# Patient Record
Sex: Male | Born: 1935 | Race: White | Hispanic: No | Marital: Married | Smoking: Former smoker
Health system: Southern US, Community
[De-identification: ages and names within clinical notes are randomized; demographics above are authoritative.]

## PROBLEM LIST (undated history)

## (undated) ENCOUNTER — Emergency Department (HOSPITAL_COMMUNITY): Payer: Medicare Other

## (undated) DIAGNOSIS — R011 Cardiac murmur, unspecified: Secondary | ICD-10-CM

## (undated) DIAGNOSIS — I341 Nonrheumatic mitral (valve) prolapse: Secondary | ICD-10-CM

## (undated) DIAGNOSIS — I10 Essential (primary) hypertension: Secondary | ICD-10-CM

## (undated) DIAGNOSIS — I480 Paroxysmal atrial fibrillation: Secondary | ICD-10-CM

## (undated) DIAGNOSIS — Z8719 Personal history of other diseases of the digestive system: Secondary | ICD-10-CM

## (undated) DIAGNOSIS — K219 Gastro-esophageal reflux disease without esophagitis: Secondary | ICD-10-CM

## (undated) DIAGNOSIS — J948 Other specified pleural conditions: Secondary | ICD-10-CM

## (undated) DIAGNOSIS — N182 Chronic kidney disease, stage 2 (mild): Secondary | ICD-10-CM

## (undated) DIAGNOSIS — N200 Calculus of kidney: Secondary | ICD-10-CM

## (undated) DIAGNOSIS — R1310 Dysphagia, unspecified: Secondary | ICD-10-CM

## (undated) DIAGNOSIS — I639 Cerebral infarction, unspecified: Secondary | ICD-10-CM

## (undated) DIAGNOSIS — M1990 Unspecified osteoarthritis, unspecified site: Secondary | ICD-10-CM

## (undated) DIAGNOSIS — E785 Hyperlipidemia, unspecified: Secondary | ICD-10-CM

## (undated) DIAGNOSIS — Z87442 Personal history of urinary calculi: Secondary | ICD-10-CM

## (undated) DIAGNOSIS — I4891 Unspecified atrial fibrillation: Secondary | ICD-10-CM

## (undated) DIAGNOSIS — Z8673 Personal history of transient ischemic attack (TIA), and cerebral infarction without residual deficits: Secondary | ICD-10-CM

## (undated) DIAGNOSIS — I2510 Atherosclerotic heart disease of native coronary artery without angina pectoris: Secondary | ICD-10-CM

## (undated) HISTORY — DX: Dysphagia, unspecified: R13.10

## (undated) HISTORY — DX: Personal history of transient ischemic attack (TIA), and cerebral infarction without residual deficits: Z86.73

## (undated) HISTORY — DX: Calculus of kidney: N20.0

## (undated) HISTORY — DX: Cerebral infarction, unspecified: I63.9

## (undated) HISTORY — DX: Hyperlipidemia, unspecified: E78.5

## (undated) HISTORY — DX: Nonrheumatic mitral (valve) prolapse: I34.1

## (undated) HISTORY — DX: Essential (primary) hypertension: I10

## (undated) HISTORY — DX: Other specified pleural conditions: J94.8

## (undated) HISTORY — DX: Atherosclerotic heart disease of native coronary artery without angina pectoris: I25.10

## (undated) SURGERY — ECHOCARDIOGRAM, TRANSESOPHAGEAL
Anesthesia: Moderate Sedation

---
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
## 1959-02-13 HISTORY — PX: APPENDECTOMY: SHX54

## 1997-10-01 ENCOUNTER — Ambulatory Visit (HOSPITAL_COMMUNITY): Admission: RE | Admit: 1997-10-01 | Discharge: 1997-10-01 | Payer: Self-pay | Admitting: *Deleted

## 2004-03-17 ENCOUNTER — Inpatient Hospital Stay (HOSPITAL_COMMUNITY): Admission: AD | Admit: 2004-03-17 | Discharge: 2004-03-20 | Payer: Self-pay | Admitting: *Deleted

## 2004-03-18 ENCOUNTER — Encounter (INDEPENDENT_AMBULATORY_CARE_PROVIDER_SITE_OTHER): Payer: Self-pay | Admitting: *Deleted

## 2004-03-19 ENCOUNTER — Encounter (INDEPENDENT_AMBULATORY_CARE_PROVIDER_SITE_OTHER): Payer: Self-pay | Admitting: *Deleted

## 2004-03-19 HISTORY — PX: CARDIAC CATHETERIZATION: SHX172

## 2004-04-07 ENCOUNTER — Inpatient Hospital Stay (HOSPITAL_COMMUNITY)
Admission: RE | Admit: 2004-04-07 | Discharge: 2004-04-13 | Payer: Self-pay | Admitting: Thoracic Surgery (Cardiothoracic Vascular Surgery)

## 2004-04-07 ENCOUNTER — Encounter (INDEPENDENT_AMBULATORY_CARE_PROVIDER_SITE_OTHER): Payer: Self-pay | Admitting: *Deleted

## 2004-04-14 HISTORY — PX: MITRAL VALVE REPAIR: SHX2039

## 2004-04-14 HISTORY — PX: CORONARY ARTERY BYPASS GRAFT: SHX141

## 2004-04-14 HISTORY — PX: MAZE: SHX5063

## 2004-05-11 ENCOUNTER — Encounter (HOSPITAL_COMMUNITY): Admission: RE | Admit: 2004-05-11 | Discharge: 2004-08-09 | Payer: Self-pay | Admitting: *Deleted

## 2004-06-01 ENCOUNTER — Encounter
Admission: RE | Admit: 2004-06-01 | Discharge: 2004-06-01 | Payer: Self-pay | Admitting: Thoracic Surgery (Cardiothoracic Vascular Surgery)

## 2004-08-17 ENCOUNTER — Encounter
Admission: RE | Admit: 2004-08-17 | Discharge: 2004-08-17 | Payer: Self-pay | Admitting: Thoracic Surgery (Cardiothoracic Vascular Surgery)

## 2005-03-22 ENCOUNTER — Encounter
Admission: RE | Admit: 2005-03-22 | Discharge: 2005-03-22 | Payer: Self-pay | Admitting: Thoracic Surgery (Cardiothoracic Vascular Surgery)

## 2005-12-29 ENCOUNTER — Inpatient Hospital Stay (HOSPITAL_COMMUNITY): Admission: EM | Admit: 2005-12-29 | Discharge: 2005-12-31 | Payer: Self-pay | Admitting: Emergency Medicine

## 2005-12-30 ENCOUNTER — Encounter (INDEPENDENT_AMBULATORY_CARE_PROVIDER_SITE_OTHER): Payer: Self-pay | Admitting: *Deleted

## 2007-04-17 ENCOUNTER — Ambulatory Visit: Payer: Self-pay | Admitting: Thoracic Surgery (Cardiothoracic Vascular Surgery)

## 2009-10-30 ENCOUNTER — Encounter: Admission: RE | Admit: 2009-10-30 | Discharge: 2009-10-30 | Payer: Self-pay | Admitting: Neurology

## 2009-11-14 ENCOUNTER — Ambulatory Visit (HOSPITAL_COMMUNITY): Admission: RE | Admit: 2009-11-14 | Discharge: 2009-11-14 | Payer: Self-pay | Admitting: Cardiology

## 2010-04-07 ENCOUNTER — Ambulatory Visit (HOSPITAL_COMMUNITY): Admission: RE | Admit: 2010-04-07 | Discharge: 2010-04-07 | Payer: Self-pay | Admitting: Gastroenterology

## 2010-09-21 ENCOUNTER — Other Ambulatory Visit: Payer: Self-pay | Admitting: *Deleted

## 2010-09-21 DIAGNOSIS — I10 Essential (primary) hypertension: Secondary | ICD-10-CM

## 2010-09-21 MED ORDER — BENAZEPRIL HCL 40 MG PO TABS: 40.0000 mg | ORAL_TABLET | Freq: Every day | ORAL | Status: DC

## 2010-09-21 NOTE — Telephone Encounter (Signed)
escribe medication per fax request  

## 2010-10-08 ENCOUNTER — Telehealth: Payer: Self-pay | Admitting: Cardiology

## 2010-10-08 NOTE — Telephone Encounter (Signed)
Called wanting to know if needs antibiotic for dental cleaning. Did not take last time he had teeth cleaned. Per Dr.Jordan does not need to have antibiotics because had mitral valve repair. Arthur [name] thought that was the case. He will let dentist know. If they have any questions can call back.

## 2010-10-08 NOTE — Telephone Encounter (Signed)
GOING TO HAVE DENTAL CLEANING ON Monday AND THEY SUGGUESTED THAT HE CALL TO SEE IF HE NEEDS TO TAKE ANTIBOTICS BEFORE.

## 2010-10-14 ENCOUNTER — Other Ambulatory Visit: Payer: Self-pay | Admitting: *Deleted

## 2010-10-14 MED ORDER — SIMVASTATIN 80 MG PO TABS: 40.0000 mg | ORAL_TABLET | Freq: Every day | ORAL | Status: DC

## 2010-10-14 NOTE — Telephone Encounter (Signed)
Pt called noted reduced dose of simvastatin to 40mg . Pt don't need refill currently, pt informed he will need a one year visit.Alfonso Ramus RN

## 2010-10-27 NOTE — Assessment & Plan Note (Signed)
OFFICE VISIT   [name], [name]  DOB:  January 15, 1929                                        April 17, 2007  CHART #:  [id number]   HISTORY OF PRESENT ILLNESS:  The patient returns for routine annual  surveillance and rhythm check now 3 years status post mitral valve  repair, coronary artery bypass grafting, and a MAZE procedure in October  2005.  He was last seen here in the office March 28, 2006.  Since  then, he has done well.  He has had no further transient ischemic  attacks as which he had suffered in July 2007.  He is getting along  quite well.  He has no exertional shortness of breath.  He has no  exertional chest discomfort.  He has no tachypalpitations or dizzy  spells.  He has no complaints.  The remainder of his past medical  history is unchanged.   CURRENT MEDICATIONS:  1. Benazepril 20 mg daily.  2. Aggrenox 20/200 one tablet daily.  3. Metoprolol 25 mg twice daily.  4. Simvastatin 80 mg daily.   PHYSICAL EXAM:  Notable for a well-appearing male with blood pressure  136/82.  Pulse 71.  Two-channel telemetry rhythm strips reveals normal  sinus rhythm.  Auscultation of the chest reveals clear breath sounds  which are symmetrical bilaterally.  No wheezes or rhonchi are noted.  Cardiovascular exam demonstrates regular rate and rhythm.  No murmurs,  rubs, or gallops are appreciated.  The abdomen is soft and nontender.  The extremities are warm and well perfused.  There is no lower extremity  edema.  The remainder of the physical exam is unrevealing.   IMPRESSION:  Satisfactory progress now 3 years following mitral valve  repair, coronary artery bypass grafting, and a MAZE procedure.  The  patient appears to be doing quite well and he is maintaining sinus  rhythm.  He does not have a murmur on exam and he has no signs or  symptoms of congestive heart failure.   PLAN:  The patient will continue to follow up with Dr. Reyes Ivan for long-  term  attention to his cardiac needs.  All of his questions have been  addressed.  In the future, he will call and return to see [place] here at  [facility] only should further problems or  difficulties arise.   Salvatore Decent. Cornelius Moras, M.D.  Electronically Signed   CHO/MEDQ  D:  04/17/2007  T:  04/18/2007  Job:  [id number]   cc:   [name]., M.D.  Evie Lacks, MD  Al Decant. Janey Greaser, MD

## 2010-10-30 NOTE — H&P (Signed)
NAME:  TULIO, FACUNDO NO.:  [id number]   MEDICAL RECORD NO.:  [id number]          PATIENT TYPE:  INP   LOCATION:  4705                         FACILITY:  [facility]   PHYSICIAN:  Genene Churn. Love, M.D.    DATE OF BIRTH:  [birth year]   DATE OF ADMISSION:  12/29/2005  DATE OF DISCHARGE:                                HISTORY & PHYSICAL   ADDENDUM   The patient was admitted initially going to be a __________ neurological  consult.  The general examination was performed the next day.  Heart  examination revealed no evidence of murmurs.  Tympanic membranes were clear.  Mouth was in good repair.  Lungs revealed no significant abnormalities.  There was no enlarged liver, spleen or kidneys.  There is no cyanosis,  clubbing or edema in the extremities.  This should be added to his admission  history and physical examination.           ______________________________  Genene Churn. Sandria Manly, M.D.     JML/MEDQ  D:  12/31/2005  T:  12/31/2005  Job:  [id number]

## 2010-10-30 NOTE — Discharge Summary (Signed)
NAME:  Arthur [name], Arthur [name] NO.:  [id number]   MEDICAL RECORD NO.:  [id number]          PATIENT TYPE:  INP   LOCATION:  2001                         FACILITY:  [facility]   PHYSICIAN:  Salvatore Decent. Cornelius Moras, M.D. DATE OF BIRTH:  1929-04-15   DATE OF ADMISSION:  04/07/2004  DATE OF DISCHARGE:                                 DISCHARGE SUMMARY   ANTICIPATED DATE OF DISCHARGE:  April 13, 2004.   ADMISSION DIAGNOSES:  1.  Severe mitral regurgitation.  2.  Severe three-vessel coronary artery disease.  3.  Persistent atrial fibrillation.   DISCHARGE/SECONDARY DIAGNOSES:  1.  Severe mitral regurgitation.  2.  Severe three-vessel coronary artery disease.  3.  Persistent atrial fibrillation.  4.  Elevated liver enzymes noted preoperatively.  Statin therapy currently      on hold.  On October 30th, SGOT showed a decreased from 70 to 49, SGPT      decreased from 73 to 43.  5.  Pulmonary hypertension.  6.  Hypertension.  7.  Hyperlipidemia.  8.  Preoperative right pleural effusion recently treated with needle      thoracentesis.  9.  Nephrolithiasis.  10. Renal insufficiency.  Creatinine on October 29th stable at 1.6.   ALLERGIES:  1.  PENICILLIN, which causes rash.  2.  SULFA-CONTAINING DRUGS, which causes rash.  3.  It is also thought that he develops a rash with LASIX.   PROCEDURES:  1.  On April 07, 2004, he underwent median sternotomy for mitral valve      repair (quadrangular resection of the posterior leaflet with sliding      leaflet annuloplasty and 26 mm Edwards Physio-Ring annuloplasty),      coronary artery bypass grafting x4 using a left internal mammary artery      to the distal left anterior descending coronary artery, saphenous vein      graft to the first diagonal branch, saphenous vein graft to the third      circumflex marginal branch, saphenous vein graft to the posterior      descending coronary artery, endoscopic saphenous vein harvesting from      the  right thigh, modified Cox-Mayes II procedure.  Surgeon, Dr. Tressie Stalker.  2.  On April 07, 2004, intraoperative transesophageal echocardiogram by      Dr. Kipp Brood.   BRIEF HISTORY:  Arthur [name] is a 77 year old Caucasian male, who was referred  to Dr. Cornelius Moras by Dr. Lady Deutscher for management of mitral regurgitation,  coronary artery disease, and persistent atrial fibrillation.  Arthur [name] has  had a long history of mitral valve prolapse as well as a history of  hypertension and hypercholesterolemia.  He has been followed for many years  by Dr. Doran Clay and over the last few years at [facility].  Approximately two to three months ago, he developed symptoms of tachy  palpitations.  During that same time frame, he also developed worsening  symptoms of exertional shortness of breath.  The symptoms worsened until  early October.  Ultimately he was admitted on October 4th  to [facility] with symptoms of increasing dyspnea on exertion, difficulty taking  a breath, and tachy palpitations.  He was noted to be in rapid atrial  fibrillation with a heart rate at 150 to 160 beats per minute.  He was  admitted to the hospital, and his atrial fibrillation was brought under  medical control.  He was also started on anticoagulation.  He subsequently  underwent a transesophageal echocardiogram, which demonstrated the presence  of severe mitral regurgitation, mitral valve prolapse, and a flailed  posterior leaflet at the mitral valve.  Left ventricular function was  notably preserved.  He subsequently underwent left and right heart  catheterization, which demonstrated the presence of severe three-vessel  coronary artery disease and confirmed the presence of severe mitral  regurgitation as well as moderate pulmonary hypertension.  Although Mr.  [name] has done well with medical therapy, it was felt he was now appropriate  for surgical intervention and thus was seen by Dr. Cornelius Moras.   After he examined  the patient and review of his tests, Dr. Cornelius Moras felt that Arthur [name] should  undergo coronary artery bypass grafting with mitral valve repair and  modified Cox-Mayes procedure.  After discussing risks, benefits,  alternatives with Arthur [name], he agreed to proceed and his surgery was  tentatively scheduled for October 25th.   HOSPITAL COURSE:  Arthur [name] was electively admitted to [facility]  on April 07, 2004, to undergo mitral valve repair, coronary artery bypass  grafting, and modified Cox-Mayes procedure.  He tolerated the procedure well  and was transferred to the surgical intensive care unit in stable condition.  Postoperative TEE showed no residual mitral regurgitation.  By later that  evening, he had been extubated and was neurologically intact.  Postoperative  labs were stable.   On postoperative day one, Arthur [name] remained hemodynamically stable.  He was  AAI paced, but with underlying sinus rhythm.  Exam showed no cardiac murmur.  Chest tube output was low, and these were discontinued.  Postoperative x-ray  was also stable.  He did show evidence of volume excess, and diuresis was  started with Edecrin as he was believed to have a LASIX ALLERGY.   On postoperative day two, Arthur [name] was felt stable for transfer to the  floor.  He was restarted on Coumadin.  That morning, he was maintaining  sinus rhythm, however, by that afternoon he was noted to be in atrial  fibrillation with a rate up to 150.  He was started on IV Amiodarone.  By  the following day, he had returned to normal sinus rhythm.  He was continued  on Metoprolol and Amiodarone.  His ACE inhibitor was held as his creatinine  had a slight elevation to 1.8.   Over the next few days, Arthur [name] remained stable.  Overall, he stayed in  sinus rhythm, but was noted to have a first degree heart block.  At times,  it also appeared that he had bursts of atrial fibrillation, but mostly was just noted  to have frequent premature atrial contractions.  His heart rate  did remain rate controlled in the 70s to 80s.  He remained hemodynamically  stable.  His oxygen saturations were above 90% on room air.  He showed  evidence of good diuresis, but remained with trace ankle edema.  His heart  showed no murmurs.  His lungs were slightly decreased in the bases, but  otherwise clear.  His abdominal exam was benign.  His incisions remained  clean and dry without signs of infection.  He was ambulating in the hallways  independently.  His renal function also remained stable, with his last  creatinine on October 29th being 1.6.  His followup LFTs showed improvement.  His bowel and bladder function was working appropriately.  He was tolerating  an oral diet, and his pain was controlled on oral medications.  Based on his  good progress, it was felt that he would be ready for discharge home on  April 12, 2004, pending no significant change in Arthur [name]' status over  the next 24 hours.   RADIOLOGY:  Chest x-ray on October 28th showed no pneumothorax with left  perihilar atelectasis, which was stable.  There was a small, left, pleural  effusion.  There was also right lower lung atelectasis and a small, right,  pleural effusion.   LABORATORY DATA:  Hepatic function panel on October 30th showed a total  bilirubin of 0.8, direct bilirubin 0.2, indirect bilirubin 0.6, alkaline  phosphatase 84, SGOT 49, SGPT 43, total protein 5.1, albumin 2.5.  At the  time of this dictation, his last PT-INR was 21.6 and 2.4.  Sodium 134,  potassium 4.0, blood glucose 101, BUN 29, creatinine 1.6.  White blood count  14.3, hemoglobin 11.6, hematocrit 34.0, platelet count 149.   DISCHARGE MEDICATIONS:  1.  Aspirin 81 mg one p.o. daily.  2.  Metoprolol 25 mg one p.o. b.i.d.  3.  Amiodarone 200 mg one p.o. b.i.d. x7 days, then decrease to 200 mg once      daily.  4.  Edecrin 50 mg one p.o. daily x5 days.  5.  K-Dur 20 mEq  one p.o. daily x5 days.  6.  Coumadin.  Final Coumadin dose to be determined prior to discharge based      on morning INR result.  7.  He was instructed to hold his Pravachol, Lotrel, and Diltiazem until      further instructed by Dr. Reyes Ivan.  8.  Ultram 50 mg 1-2 tablets p.o. q.4-6h p.r.n. pain.   DISCHARGE INSTRUCTIONS:  1.  He is avoid driving or heavy lifting more than 10 pounds.  2.  He was encouraged to continue daily walking and breathing exercises.  3.  He was to follow a low fat, low salt diet.  4.  He may shower daily and clean his incisions with mild soap and water.  5.  He should notify the CVTS office if he develops fever greater than 101,      redness or drainage from his incision site.   FOLLOWUP:  1.  He is to obtain PT-INR blood work on April 15, 2004, at Dr. Silva Bandy      office.  He is to call and schedule his appointment.  2.  He is to call to schedule a two-week followup with Dr. Reyes Ivan. 3.  He is to see Dr. Tressie Stalker at the CVTS office in three weeks. The      CVTS office will contact him regarding a specific appointment date and      time.  He is to bring his latest chest x-ray with him to his      appointment.       ________________________________________  Jerold Coombe, P.A.  ___________________________________________Clarence H. Cornelius Moras, M.D.    AWZ/MEDQ  D:  04/12/2004  T:  04/12/2004  Job:  [id number]   cc:   Salvatore Decent. Cornelius Moras, M.D.  [address]  [place]  [place] [address]   [name]., M.D.  [address]  [place], [place] [address]  Fax: [phone]   Al Decant. Janey Greaser, MD  [address]  [place]  [place] [address]  Fax: [phone]

## 2010-10-30 NOTE — Discharge Summary (Signed)
NAME:  TUCK, DULWORTH NO.:  [id number]   MEDICAL RECORD NO.:  [id number]          PATIENT TYPE:  INP   LOCATION:  3739                         FACILITY:  [facility]   PHYSICIAN:  [name]., M.D.DATE OF BIRTH:  09/02/1928   DATE OF ADMISSION:  03/17/2004  DATE OF DISCHARGE:  03/20/2004                                 DISCHARGE SUMMARY   DISCHARGE DIAGNOSES:  1.  Atrial fibrillation.  2.  Moderate to severe mitral regurgitation with significant prolapse of the      posterior mitral valve leaflet.  3.  Three-vessel coronary artery disease.  4.  Moderate pulmonary hypertension (secondary to his mitral regurgitation).  5.  Hypertension.  6.  Dyslipidemia.  7.  Transudative pleural effusion.   PROCEDURES DURING HOSPITALIZATION:  1.  Transesophageal echocardiogram.  2.  Thoracentesis.  3.  Right and left heart catheterization.   HISTORY OF PRESENT ILLNESS:  A 77 year old white male with Past Medical  History of hypertension, dyslipidemia presented to primary care physician's  office with complaints of palpitations and heart racing off and on for the  last 3 to 4 weeks.  Also noted increasing dyspnea on exertion and inability  to take a full breath.  The patient was sent to the hospital for direct  admission and further evaluation of his complaints.   HOSPITAL COURSE:  On arrival to the hospital, the patient was seen  immediately.  He was found to be in atrial fibrillation with a rapid  ventricular response with a heart rate in the range of 150 to 160.  He was  given a Cardizem 15 mg bolus followed by Cardizem drip at 10 mg per hour  with appropriate rate control.  He as anticoagulated with Lovenox.  He  reported significant improvement with rate control.  On hospital day #2, he  underwent transesophageal echocardiogram which showed a flail posterior  leaflet of the mitral valve at the middle cusp with moderate to severe  eccentric mitral regurgitation.  EF  was preserved at 55 to 60%.  Left atrial  size was moderately enlarged, right atrial size was mildly enlarged, left  ventricular chamber size was normal.  The patient was maintained on  appropriate rate-controlling medications.  Chest x-ray was obtained which  showed a moderate size right pleural effusion.  The patient then underwent  thoracentesis which showed a transudative effusion.  He reported significant  improvement in his breathing after his thoracentesis was completed.  Following his thoracentesis, he underwent right and left heart  catheterization showing obstructive three-vessel coronary disease with  severe mitral regurgitation, preserved LV systolic function, and moderate  pulmonary hypertension.  Please see full catheterization dictation for full  details.  Post catheterization he has done well.  He has now been tolerating  p.o. diet well.  His breathing has had significant improvement and he is now  ambulatory and back to normal.  He will be discharged home today on the  following medications:   DISCHARGE MEDICATIONS:  1.  Lotrel 5/20 mg once a day.  2.  Pravachol 80 mg once a day.  3.  Aspirin 81 mg once a day (the patient will take 325 mg until he is seen      in the office).  4.  Cardizem 120 mg b.i.d.  5.  Metoprolol 50 mg b.i.d.  6.  Coumadin 5 mg daily.  7.  Lasix 40 mg daily.   PAIN MANAGEMENT:  Ibuprofen or Tylenol on a p.r.n. basis.   ACTIVITY:  The patient was advised to do no strenuous activity or heavy  lifting for 48 hours.   DIETARY:  Should be low salt, low cholesterol diet.   WOUND CARE:  Routine post cath care.   The patient's fasting lipid panel was still pending on time of discharge.  This was sent on March 20, 2004.  An outpatient appointment will be made  for the patient to follow up with Dr. Tyrone Sage or Dr. Cornelius Moras for evaluation of  mitral valve repair/replacement, maze procedure, and coronary artery bypass  grafting.  Otherwise, the patient  will follow up in the office next Monday  or Tuesday for INR check as well as routine post cath office care.  I did  advise the patient to call should he have any further questions regarding  his ongoing care.       TWK/MEDQ  D:  03/20/2004  T:  03/20/2004  Job:  [id number]

## 2010-10-30 NOTE — Op Note (Signed)
NAME:  RIGDON, MACOMBER NO.:  [id number]   MEDICAL RECORD NO.:  [id number]          PATIENT TYPE:  INP   LOCATION:  2306                         FACILITY:  [facility]   PHYSICIAN:  Salvatore Decent. Cornelius Moras, M.D. DATE OF BIRTH:  22-May-1929   DATE OF PROCEDURE:  04/07/2004  DATE OF DISCHARGE:                                 OPERATIVE REPORT   PREOPERATIVE DIAGNOSIS:  1.  Severe mitral regurgitation.  2.  Severe three vessel coronary artery disease.  3.  Persistent atrial fibrillation.   POSTOPERATIVE DIAGNOSIS:  1.  Severe mitral regurgitation.  2.  Severe three vessel coronary artery disease.  3.  Persistent atrial fibrillation.   PROCEDURE:  Median sternotomy for mitral valve repair (quadrangular  resection of the posterior leaflet with sliding leaflet annuloplasty and #26  mm Edwards Physio ring annuloplasty), coronary artery bypass grafting x 4  (left internal mammary artery to distal left anterior descending  coronary  artery, saphenous vein graft to first diagonal branch, saphenous vein graft  to third circumflex marginal branch, saphenous vein graft to posterior  descending coronary artery, endoscopic saphenous vein harvest from right  thigh), modified Cox-Maze IV procedure.   SURGEON:  Salvatore Decent. Cornelius Moras, M.D.   ASSISTANT:  Sheliah Plane, MD   SECOND ASSISTANT:  Carmin Muskrat. Thelma Barge, P.A.-C.   ANESTHESIA:  General.   BRIEF CLINICAL NOTE:  The patient is a 77 year old gentleman with a known  history of mitral valve prolapse with mitral regurgitation, hypertension,  and hyperlipidemia.  He presents with tachy palpitations and progressive  symptoms of shortness of breath and is found to be in atrial fibrillation.  He was fully evaluated Dr. Lady Deutscher and underwent elective left nasd  right heart catheterization as well as transesophageal echocardiogram.  Findings of these studies demonstrated the presence of mitral valve prolapse  involving the posterior leaflet  of the mitral valve with severe (4+) mitral  regurgitation.  The patient also has severe three vessel coronary artery  disease.  Left ventricular function appears preserved.  A full consultation  note has been dictated previously.   OPERATIVE CONSENT:  The patient and his wife have been counseled at length  regarding the indications and potential benefits of elective surgical  intervention for definitive treatment of his mitral valve disease, coronary  artery disease, and his atrial fibrillation.  A variety of alternative  treatment strategies have been discussed in detail.  The patient understands  and accept all the associated risks of surgery including but not limited to  the risks of death, stroke, myocardial infarction, congestive heart failure,  respiratory failure, pneumonia, renal failure, bleeding requiring blood  transfusion, arrhythmia, heart block or bradycardia requiring permanent  pacemaker, recurrent coronary artery disease, recurrent congestive heart  failure, recurrent atrial fibrillation.  He understands the possibility of  need for valve replacement if successful valve repair is not feasible and if  this were the situation, the patient has elected to request a bioprosthetic  tissue valve be used in an effort to avoid the need for long term  anticoagulation with Coumadin.  All of his questions  have been addressed.   OPERATIVE FINDINGS:  1.  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  2.  Severe left atrial enlargement.  3.  Moderate to severe left ventricular dilatation and mild left ventricular      hypertrophy.  4.  Severe coronary artery disease with diffuse disease afflicting all of      the epicardial coronary arteries.  5.  Successful valve repair associated with no residual mitral regurgitation       after completion of the operation.   OPERATIVE NOTE IN DETAIL:  The patient was brought to the operating room on  the above mentioned date and central monitoring is established by the  anesthesia service under the care and direction of Dr. Kipp Brood.  Specifically, a Swan-Ganz catheter was placed through the right internal  jugular approach.  A radial arterial line was placed.  Intravenous  antibiotics are administered.  Following induction with general endotracheal  anesthesia, a Foley catheter was placed.  Baseline transesophageal  echocardiogram is performed by Dr. Noreene Larsson.  This confirms the presence of  severe mitral regurgitation with prolapse involving the middle scallop of  the posterior leaflet of the mitral valve.  There is a large jet of mitral  regurgitation which skirts along the anterior surface of the left atrium and  wraps around the atrium.  There is flow reversal in the pulmonary veins.  The left atrium is severely enlarged.  Left ventricular function appears  overall reasonably well preserved.  No other significant abnormalities are  identified.   The patient's chest, abdomen, both groins, and both lower extremities are  prepped and draped in a sterile manner.  A median sternotomy incision was  performed and the left internal mammary artery dissected from the chest wall  and prepared for bypass grafting.  The left internal mammary artery was a  good quality conduit.  Endoscopic saphenous vein harvest is performed and  the greater saphenous vein is removed from the entire right thigh and the  upper portion of the right lower leg.  This was performed through a small  incision made just below the right knee.  The saphenous vein which was  removed is notably excellent quality conduit.  After the saphenous vein is  removed, the small surgical incisions in the right lower extremity are  closed in multiple layers with running absorbable suture.  The patient was  heparinized systemically.  The pericardium was opened.  The  ascending aorta is mildly dilated.  It is free of any palpable plaques or  calcifications.  The ascending aorta is cannulated for cardiopulmonary  bypass.  A venous cannula is placed directly in the superior vena cava.  A  second venous cannula is placed low in the right atrium with the tip  extending down the inferior vena cava.  A retrograde cardioplegia catheter  is placed through the right atrium into the coronary sinus.  Adequate  heparinization is verified.  Cardiopulmonary bypass was begun.  Vessel loops  are placed around the superior vena cava and inferior vena cava.  The distal  sites are selected for coronary bypass grafting.  There is mild left  ventricular  hypertrophy.  There is moderate to severe left ventricular  dilation.  There is severe left atrial enlargement.  Portions of the  saphenous  vein and the left internal mammary artery are trimmed to the  appropriate length.  A temperature probe was placed in the left ventricular  septum.  A cardioplegia catheter was placed in the ascending aorta.  The  patient was cooled to 28 degrees systemic temperature.  The aortic Cross  clamp was applied and cardioplegia delivered initially in an antegrade  fashion through the aortic root.  Ice saline slush is applied for topical  hypothermia.  The initial cardioplegia arrest and myocardial cooling are  felt to be excellent.  Supplemental cardioplegia are administered retrograde  through the coronary sinus catheter.  Repeat doses of cardioplegia are  administered intermittently throughout the crossclamp portion of the  operation through the aortic root, down the subsequently placed vein grafts,  and retrograde through the coronary sinus catheter to maintain septal  temperature below 15 degrees Centigrade.   The heart is gently retracted towards the surgeons side to expose the  reflection of the left atrium and the left sided  pulmonary veins.  A Harpen  clamp is placed around behind the left sided pulmonary veins at their origin  from the left atrium.  An elliptical ablation lesion is created around the  base of the left sided pulmonary veins on the left atrial wall using the  Medtronic Cardioblate irrigated bipolar radiofrequency ablation device.  Following completion of this, another elliptical ablation lesion is created  with the bipolar ablation device across the base of the left atrial  appendage.  Subsequently, a short  linear lesion is created using a unipolar  irrigated radiofrequency ablation device connecting the ellipse that  surrounds the left atrial appendage with the ellipse surrounding the base of  the left sided pulmonary veins.  This was performed along the epicardial  surface of the left atrium.   Attention is now directed towards the distal coronary anastomoses and the  following distal coronary anastomoses are performed:  1.  The posterior descending artery is grafted with a saphenous vein graft      in an end-to-side fashion.  This vessel was diffusely diseased both      proximally and distally.  At the site of distal bypass, it measures 1.5      mm in diameter.  A 1.0 probe will pass distally.  The saphenous vein is      a good quality conduit.  The coronary is fair to poor quality target.  2.  The third circumflex marginal branch is grafted with a saphenous vein      graft in an end-to-side fashion.  This coronary is the largest branch      off the left circumflex coronary system.  It measures 1.4 mm in diameter      at the site of distal bypass.  It is a fair quality target and is      diffusely diseased proximally.  3.  The diagonal branch off the left anterior descending coronary artery is      grafted with a saphenous vein graft in an end-to-side fashion.  This      coronary measures 1.5 mm in diameter at the site of distal bypass.  It      is diffusely diseased proximally but is a  good target for bypass at the      site of distal grafting.  4.  The distal left anterior descending coronary artery is grafted with the      left internal mammary artery in an end-to-side fashion.  This coronary  measures 1.1 mm in diameter at the site of the distal bypass.  This      vessel is diffusely diseased proximally and small at the site of distal      bypass grafting.  It is a fair quality target, at best.   A left atriotomy incision is performed posteriorly through the intra-atrial  groove.  The floor of the left atrium is exposed using a self-retaining  retractor.  The following set of lesions are performed to complete the left  sided components of the modified Cox-Maze procedure:  1.  An elliptical lesion is created around the base of the right sided      pulmonary veins using the bipolar irrigated radiofrequency ablation      device.  The atriotomy incision serves as the anterior half of this      ellipse, whereas the posterior half is completed using one limb of the      bipolar device placed within the left atrium along the endocardial      surface and the other limb placed posterior to the left atrium along the      epicardial surface.  2.  A linear lesion is created across the dome of the left atrium placed      from the apex of the left atriotomy incision to reach straight across to      the apex of the ablation lesion surrounding the left sided pulmonary      vein.  This was performed with a bipolar device, again, using one limb      of the device along the endocardial surface and the other limb along the      epicardial surface.  3.  A similar linear lesion is created from the inferior rim of the left      atriotomy incision across the back wall of the left atrium to reach the      inferior rim of the elliptical lesion surrounding the left sided      pulmonary veins.  4.  A linear lesion is placed from the inferior rim of the atriotomy     incision and oriented  towards the mitral valve annulus in the direction      in the vicinity between the P2 and P3 portion of the posterior mitral      valve annulus.  This lesion was performed initially with the bipolar      lesion and this will reach about half the way to the mitral annulus.      The remainder of this lesion is completed all the way to the mitral      valve annulus using a unipolar handheld irrigation radiofrequency      ablation device from within the left atrium along the endocardial      surface.  This completes the components of the left sided Cox-Maze      procedure.   Attention is now directed to the mitral valve and the mitral valve is  exposed using a self-retaining retractor.  Exposure is felt to be excellent.  The valve was carefully analyzed and one can appreciate an obvious flail  segment of the middle scallop (P2) of the posterior leaflet of the mitral  valve.  There are several ruptured primary chords to this segment of the  posterior leaflet.  There is a large, well defined cleft between the P1 and  P2 portion of the posterior leaflet and a moderately developed cleft between  the P2 and P3 portion.  The flail segment of the posterior leaflet is  located somewhat eccentrically towards the P3 cleft.  There is no prolapse  involving the anterior leaflet and, in fact, the anterior leaflet is  relatively small in total dimension for a patient with myxomatous disease.  The total surface area of the anterior leaflet approximates the surface area  of a 26 mm annuloplasty ring.  Mitral valve repair is now performed by  performing a quadrangular resection of the portion of the middle scallop of  the posterior leaflet.  The entire flail segment is resected and this  extends from the cleft between the P2 and P3 portions of the posterior  leaflet across approximately 2/3 of the P2 segment.  Sliding leaflet  annuloplasty is then performed by initially mobilizing the remainder of the  P2  leaflet and portion of P1 as well as the P3 leaflet off the posterior  mitral annulus.  The posterior annulus is then shortened using several  interrupted figure-of-eight compression sutures with 2-0 Ethibond sutures.  The remaining segments of the posterior leaflet are then each reapproximated  to the posterior annulus using a two layered closure of running 4-0 Ethibond  suture.  The remaining vertical defect in the posterior leaflet is closed  using interrupted 5-0 Ethibond everting simple sutures.  The cleft between  the P1 and P2 portion of the posterior leaflet is also closed similarly  using interrupted 5-0 Ethibond everting simple sutures.  Ring annuloplasty  is performed using interrupted 2-0 Ethibond horizontal mattress sutures  placed circumferentially around the entire mitral valve annulus.  The mitral  annulus is sized to accept a 26 mm annuloplasty ring.  Subsequently, an  Edwards Physio ring is secured in place (model number 4450, serial number [id number]).  At the completion of the mitral valve repair, the valve is  inspected for competence.  The valve has a nice symmetrical line of  coaptation of the anterior and posterior leaflets and there appears to be no  residual leak, whatsoever.  Rewarming is begun.  The left atrial appendage  is oversewn from within the left atrium using a two layer closure of running  3-0 Prolene suture.   An oblique right atriotomy incision is performed extending from the acute  margin of the heart in an oblique direction oriented towards the right  inferior pulmonary vein.  Following this, the tip of the right atrial  appendage is amputated.  The Medtronic bipolar irrigated radiofrequency  ablation device is now utilized to create a series of bipolar ablation  lesions on the right atrial side:   1.  A linear lesion is placed along the posterior edge of the right atrium      extending from the posterior apex of the right atriotomy incision in a       cephalad direction onto the posterior surface of the superior vena cava.  2.  A similar linear lesion is placed in the opposite direction beginning at      the posterior apex of the right atriotomy incision and extending down      onto the posterior surface of the inferior vena cava.  3.  A linear lesion is created across the posterior half of the intra-atrial      septum beginning at the posterior apex of the right atriotomy incision      and extending to reach the inferior rim of the fossa ovalis.  This is      performed with one limb of the bipolar device  in the right atrium and      the other limb in the left atrium.  4.  A linear lesion is created from the tip of the right atrial appendage      extending in the direction perpendicular to the other right atriotomy      incision over a distance of 2 cm.  5.  One last bipolar lesion is created from the tip of the right atrial      appendage extending to reach the anterior acute margin of the heart.   The left atriotomy incision is closed using a standard two layer closure of  running 3-0 Prolene suture.  The proximal saphenous vein anastomoses for  both the circumflex marginal branch and the posterior descending coronary  artery branch were grafted directly to the ascending aorta prior to removal  of the aortic Cross clamp.  The patient is placed in Trendelenburg position.  The left internal mammary artery is reperfused and the left ventricular  septal temperature is noted to rise rapidly.  One final dose of warm  retrograde hot shot cardioplegia is administered.  The lungs are ventilated  and the heart allowed to fill to evaluate any residual air from the  pulmonary veins and left heart circulation.  The aortic Cross clamp was  removed after a total crossclamp time of 171 minutes.  The heart begins to  beat spontaneously without need for cardioversion.  The aortic root vent is  placed using a Magoon needle.  The remainder of the right  sided lesion set of the Cox-Maze procedure is now completed using the handheld unipolar  radiofrequency ablation device:  1.  A lesion is begun at the end of the previous lesion along the inferior      rim of the fossa ovalis and continued to reach the inferior rim of the      coronary sinus and on across the isthmus of the right atrium to reach      the posterior aspect of the tricuspid valve annulus.  Another lesion is      created from the tricuspid annulus across the isthmus of the heart to      reach the inferior vena cava along its anterior side.  2.  A short linear lesion is created from the anterior apex of the right      atriotomy incision across the acute margin of the heart to reach the      anterolateral portion of the tricuspid annulus.  3.  One final linear lesion is created from the anteromedial surface of the      tricuspid annulus to reach the previous linear lesion from the base of      the right atrial appendage.   The right atriotomy incisions are all closed using a standard two layer  closure of running 4-0 Prolene suture.  The proximal saphenous vein  anastomosis of the vein graft to the diagonal branch is now constructed in  an end-to-side fashion onto the hood of the saphenous vein graft to the  circumflex marginal branch.  The patient is rewarmed to 37 degrees  Centigrade temperature.  All proximal and distal anastomoses are inspected  for hemostasis and appropriate graft orientation.  Epicardial pacing wires  are affixed to the right ventricular outflow tract.  The IVC cannula is  removed and its cannulation site oversewn with Prolene suture.  Atrial  pacing wires are sewn to the surface of the right atrium immediately  inferior to the sinus  nodes.  Low dose Dopamine infusion is begun at 4 mcg  per kg per minute.  The patient is weaned from cardiopulmonary bypass  without difficulty.  The patient's rhythm at separation from bypass is  junctional rhythm.  Atrial  pacing is employed and the atrial capture is  straight forward.  Total cardiopulmonary bypass time for the operation is  219 minutes.  The patient is weaned from bypass on low dose Dopamine.   Follow up transesophageal echocardiogram performed by Dr. Noreene Larsson after  separation from bypass demonstrates a well seated mitral annuloplasty ring  with normal appearing mitral valve and no residual mitral regurgitation.  There is insignificant air.  Left ventricular  function appears preserved.  No other abnormalities are noted.  The aortic root vent is removed.  The SVC  cannula is removed and its cannulation site oversewn with Prolene suture.  The aortic cannula is removed.  Protamine is administered to reverse the  anticoagulation.  The mediastinum and the left chest are irrigated with  saline solution containing vancomycin.  Meticulous surgical hemostasis is  ascertained.  The patient is transfused 2 units of fresh frozen plasma and  one 10 pack of platelets due to mild coagulopathy.  The mediastinum and both left and right pleural spaces are drained using four chest tubes placed  through separate stab incisions inferiorly.  Of note, the patient has a  large partially loculated right pleural effusion which is drained.  The  median sternotomy  is closed in routine fashion and the soft tissues  anterior to the sternum are closed in multiple layers.  The skin was closed  is closed with running subcuticular skin closure.   The patient tolerated the procedure well and is transported to the surgical  intensive care unit in stable condition.  There are no interoperative  complications.  All sponge, instrument, and needle counts were verified as  correct at the completion of the operation.       CHO/MEDQ  D:  04/07/2004  T:  04/07/2004  Job:  [id number]   cc:   [name]., M.D.  [address]  [place], [place] [address]  Fax: [phone]   Al Decant. Janey Greaser, MD  [address]   [place]  [place] [address]  Fax: [phone]

## 2010-10-30 NOTE — Discharge Summary (Signed)
NAME:  Arthur [name], Arthur [name]                ACCOUNT NO.:  [id number]   MEDICAL RECORD NO.:  [id number]          PATIENT TYPE:  INP   LOCATION:  4705                         FACILITY:  [facility]   PHYSICIAN:  Gustavus Messing. Orlin Hilding, M.D.DATE OF BIRTH:  1929-06-12   DATE OF ADMISSION:  12/29/2005  DATE OF DISCHARGE:  12/31/2005                                 DISCHARGE SUMMARY   ADMITTING DIAGNOSIS:  Stroke versus transient ischemic attack, right brain.   DISCHARGE DIAGNOSIS:  Right brain transient ischemic attack.  MRI of the  brain was negative for stroke.   ADDITIONAL DIAGNOSES:  Include hypertension under poor control,  hyperlipidemia, 3-vessel coronary artery disease, remote history of atrial  fibrillation, mitral valve disease, status post annuloplasty, history of  chronic renal insufficiency, history of renal stones.   DIET:  Should be low fat, low cholesterol, heart prudent diet.   ACTIVITY:  No restrictions.   WOUND CARE:  Not applicable.   PAIN MANAGEMENT:  Tylenol 30 minutes before Aggrenox for 10 days.   MEDICATIONS:  Is a new medication, Aggrenox 1 twice a day.  He should not  take his aspirin.  Other than that, he is to resume his prior medications,  which included metoprolol 25 mg b.i.d., Vytorin 10 mg/40 mg p.o. daily,  benazepril 20 mg daily.   RETURN:  He should call Dr. Imagene Gurney office at [phone] to make an appointment  for a revisit in about 4 weeks.  He does not need any special referrals or  services.   STUDIES:  Include a 2D echocardiogram which was negative for any evidence of  a cardiac source of embolus.  Carotid Dopplers which showed no stenosis of  the carotids, vertebrals were antegrade.  Homocysteine level was normal at  13.5.  The MRI was not negative but did show multiple tiny right emboli.  MRA was negative intracranially.  Although right ICA disease was suspected  by Dr. Sandria Manly, it was not borne out by the carotid Doppler study.   HOSPITAL COURSE:  Please  refer to the admission notes for details of the  admission.  This is a 77 year old man who presented for transient left-sided  arm numbness which was gone by the time he was seen by Neurology.  He does  have risk factors for stroke, and so he was admitted to have carotid  Doppler, MRI of the brain, MRA, 2D echo, homocysteine level, and telemetry.  He did not have any recurrent atrial fibrillation while in the hospital.  The echo  and Dopplers were negative as already described.  Homocysteine was normal.  He is improved in normal condition with a normal neurologic exam with a  diagnosis of right brain TIA, and his medication was changed from aspirin to  Aggrenox.      Catherine A. Orlin Hilding, M.D.  Electronically Signed     CAW/MEDQ  D:  12/31/2005  T:  12/31/2005  Job:  [id number]

## 2010-10-30 NOTE — Cardiovascular Report (Signed)
NAME:  Arthur [name], Arthur [name] NO.:  [id number]   MEDICAL RECORD NO.:  [id number]          PATIENT TYPE:  INP   LOCATION:  3739                         FACILITY:  [facility]   PHYSICIAN:  [name]., M.D.DATE OF BIRTH:  06-03-1929   DATE OF PROCEDURE:  03/19/2004  DATE OF DISCHARGE:                              CARDIAC CATHETERIZATION   PROCEDURE:  Left-heart catheterization, right heart catheterization, LV  angiogram, aortic root angiogram with runoff.   INDICATIONS:  Moderate-to-severe mitral regurgitation.  Preoperative workup  prior to valve surgery.   PROCEDURE DESCRIPTION:  The patient was brought to the cardiac  catheterization laboratory after obtaining appropriate consent.  He was  prepped and draped in sterile fashion.  Approximately 20 cc of 1% lidocaine  was used for local anesthesia.  An 8-French sheath was placed in the right  femoral vein without difficulty, and a 6-French sheath was placed in the  right femoral artery without difficulty.  Right-heart catheterization was  then performed.  This was followed by left-heart catheterization, LV  angiogram, aortic root angiogram with runoff.  The patient tolerated the  procedure well.  No complications. He was transferred from the cardiac  catheterization laboratory in stable condition.   FINDINGS:  RIGHT HEART CATHETERIZATION:  RA: 18 mmHg.  RV: 50/16 mmHg.  PA:  53/24 mmHg.  Pulmonary capillary wedge pressure:  32 mmHg.  Cardiac output  4.6 with an index of 2.4 by thermodilution.  Cardiac output by Fick, 3.4  liters per minute with cardiac index of 1.7.  AO pressure 121/77, LVEDP  measured 24 mmHg.   LEFT HEART CATHETERIZATION:  LEFT MAIN:  A long vessel, moderate calcification.  No significant  obstructive disease.  LAD:  Proximal ectasia with tandem, mid 80% stenosis.  D1: A moderate-size  vessel with sequential 80% stenosis.  LCX:  Codominant vessel with ostial 40-  50% stenosis with 60-70% mid  stenosis prior to OM2 and OM3 branching.  OM1  has mild, luminal irregularities.  OM2, moderate size with mild luminal  irregularities.  OM3, moderate size with mild luminal irregularity.  RAMUS:  A small vessel with diffuse, moderate disease.  RCA:  Codominant with proximal ectasia, followed by proximal 50-60%  stenosis.  He has mid ectasia, followed by a 40-50% mid stenosis.  Also  distal ectasia.  PDA has sequential 70-80% stenosis at the mid vessel.  Right to left collaterals were seen filling the OM system.  LVEF was 55-60%  with 3-4+ mitral regurgitation.  Aortic root showed mild calcification with  mild enlargement, no AI.  The descending aorta, abdominal aorta, right iliac  was followed in runoff pattern showing no significant obstructive disease,  and diffuse ectatic areas in the right iliac system.   IMPRESSION:  1.  Obstructive, three-vessel coronary artery disease.  2.  Moderate-to-severe mitral regurgitation.  3.  Moderate pulmonary hypertension.   PLAN:  We will consult cardiothoracic surgeons for evaluation of coronary  artery bypass surgery as well as mitral valve repair/replacement.       TWK/MEDQ  D:  03/19/2004  T:  03/20/2004  Job:  [id number] 

## 2010-10-30 NOTE — Op Note (Signed)
NAME:  Arthur [name], EFFERSON NO.:  [id number]   MEDICAL RECORD NO.:  [id number]          PATIENT TYPE:  INP   LOCATION:  2306                         FACILITY:  [facility]   PHYSICIAN:  Guadalupe Maple, M.D.  DATE OF BIRTH:  06-Sep-1928   DATE OF PROCEDURE:  04/07/2004  DATE OF DISCHARGE:                                 OPERATIVE REPORT   PROCEDURE:  Intraoperative transesophageal echocardiography.   Arthur [name] is a 77 year old white male with a history of three-vessel  coronary artery disease, mitral valve prolapse, and severe mitral  regurgitation.  He is scheduled to undergo coronary artery bypass grafting  and mitral valve repair or replacement. Intraoperative transesophageal  echocardiography was requested to evaluate the mitral valve and to assist in  determining if it is suitable for repair and to assist in adequacy of the  repair.   The patient is brought in the operating room of [facility] and  general anesthesia was induced without difficulty. The trachea was intubated  without difficulty. A transesophageal echocardiography probe was inserted  into the esophagus without difficulty.   IMPRESSION:   PRE-BYPASS FINDINGS:  1.  Severe mitral regurgitation which is graded at 3 to 4+. There was a      flail segment of the mid portion of the posterior leaflet of the mitral      valve which over-road the corresponding middle portion of the anterior      leaflet. This resulted in a jet of mitral insufficiency which was      directed anterolaterally along the superior surface of the anterior      leaflet. There was proximal isovelocity of surface area present on the      ventricular side of the mitral valve. There was evidence of flow      reversal in the left and right upper pulmonary veins by pulse wave      Doppler. There was evidence of torn cordae in the middle scallop of the      posterior leaflet.  2.  Aortic valve. The aortic was trileaflet and  opened normally, and there      was trace aortic insufficiency. There was no calcification of the aortic      leaflet.  3.  The left ventricle showed good contractility in all segments      interrogated. The ejection fraction was estimated at 55% to 60%. The      left ventricular wall thickness at end-diastole at the mid capillary      level was 1.18 cm.  There was no thrombus noted in the left ventricular      apex.  4.  The left atrium was enlarged and measured 6.88 cm in the anterior-      posterior dimension and 4.87 cm in the mediolateral dimension. There was      no evidence of left atrium or left atrial appendage.  5.  The inner atrial septum was intact and there was no evidence of patent      foraminovale or atrioseptal defect.  6.  The tricuspid valve appeared  structurally normal and there was trace      insufficiency.  7.  The right ventricular size was normal and there was good contractility      of the right ventricular free wall.  8.  The proximal ascending aorta was not dilated and showed no evidence of      significant atheromatous disease.  9.  The descending thoracic aorta measured 2.67 cm in diameter and showed      mild atheromatous disease.   POST BYPASS FINDINGS:  1.  There was an annuloplasty ring in the mitral position. There was a      characteristic trap door of the anterior leaflet which is normal when an      annuloplasty ring is present. There was trace mitral insufficiency.  2.  The aortic valve was unchanged from the pre-bypass study that was trace      aortic insufficiency and normal opening of the aortic valve leaflets.  3.  Left ventricular function showed initially desynchrony of the left      ventricular contraction consistent with ventricular pacing, and there      was decreased contractility of the interventricular septum. However,      this improved and there was good contractility in all segments      interrogated with an ejection fraction  estimated at 55%.  4.  The right ventricular size appeared to be within normal limits and there      was good contractility of the right ventricular free wall.  5.  The tricuspid valve showed trace tricuspid insufficiency and the      tricuspid apparatus appeared structurally normal.       DCJ/MEDQ  D:  04/07/2004  T:  04/07/2004  Job:  [id number]

## 2010-10-30 NOTE — H&P (Signed)
NAME:  Arthur [name], Arthur [name]                ACCOUNT NO.:  [id number]   MEDICAL RECORD NO.:  [id number]          PATIENT TYPE:  INP   LOCATION:  NA                           FACILITY:  [facility]   PHYSICIAN:  Salvatore Decent. Cornelius Moras, M.D. DATE OF BIRTH:  [birth year]   DATE OF ADMISSION:  04/07/2004  DATE OF DISCHARGE:                                HISTORY & PHYSICAL   PRESENTING CHIEF COMPLAINT:  Shortness of breath with exertion and rapid  heart beat.   HISTORY OF PRESENT ILLNESS:  Mr. Mirarchi is a 77 year old white male who is  referred by Dr. Lady Deutscher for management of mitral regurgitation,  coronary artery disease, and persistent atrial fibrillation. Mr. Christen has a  long history of mitral valve prolapse as well as a history of hypertension  and hypercholesterolemia. He has been followed for many years by Dr. Doran Clay, and over the last few years he has been followed at [facility] with known history of mitral valve prolapse with regurgitation.  Approximately two or three months ago he first developed symptoms of  tachypalpitations. During this period of time he has also developed  worsening symptoms of exertional shortness of breath. These symptoms all  seem to get worse until early October. Ultimately, he was admitted on  March 17, 2004, to [facility] with symptoms of increasing  dyspnea on exertion, difficulty taking a deep breath, and tachypalpitations.  He was noted to be in rapid atrial fibrillation with heart rate 150 to 160  beats per minute. He was admitted to the hospital and his atrial  fibrillation was brought under medical control, and he was started on  anticoagulation. He subsequently underwent a transesophageal echocardiogram  which demonstrated the presence of severe mitral regurgitation with mitral  valve prolapse and a flailed posterior leaflet of the mitral valve. Left  ventricular function is notably preserved. He subsequently underwent left  and right heart catheterization. This also demonstrated the presence of  severe three-vessel coronary artery disease and confirmed the presence of  severe mitral regurgitation as well as moderate pulmonary hypertension. Mr.  Kinyon has done well with medical therapy and he is now being referred for  possible surgical intervention.   REVIEW OF SYSTEMS:  GENERAL: The patient reports feeling well otherwise. He  has good appetite and he has not been gaining or losing weight recently.  CARDIAC: The patient specifically denies any symptoms of substernal chest  pain, chest tightness, or chest pressure either with activity or at rest. He  has had progressive exertional shortness of breath that has gradually  increased over the last few months. His breathing is much better since his  recent hospitalization at which time he was treated medically as well as  having undergone right needle thoracentesis for a large transudative pleural  effusion. He still gets short of breath with some activity, but his  breathing has been much better over the last week or two. He has had  frequent palpitations dating back at least two or three months. He has not  had any dizzy spells or syncopal episodes.  He has had some mild bilateral  lower extremity edema. He denies episodes of PND and orthopnea. RESPIRATORY:  The patient reports intermittent dry cough that is nonproductive. He denies  any productive cough, hemoptysis, wheezing. GASTROINTESTINAL: Negative. The  patient has no difficulty swallowing. He reports normal bowel function. He  denies history of hematochezia, hematemesis, or melena. GENITOURINARY:  Negative. The patient has no difficulty urinating. He denies any recent  history of hematuria or dysuria. PERIPHERAL VASCULAR: Negative. The patient  denies symptoms suggestive of claudication. The patient denies problems with  lower extremity phlebitis or history of deep venous thrombosis.  NEUROLOGIC:  Negative.  The patient denies any episodes suggestive of seizures. He  specifically denies any episodes of transient blindness or transient  numbness or weakness involving either upper or lower extremity.  MUSCULOSKELETAL: Negative. The patient denies problems with arthritis or  arthralgias. PSYCHIATRIC: Negative. The patient denies recent problems with  stress or anxiety. HEENT: Negative. The patient reports good dentition. He  has partial upper and lower dentures. He sees his dentist regularly and gets  routine antibiotic prophylaxis for his known history of mitral valve  prolapse. He denies any recent visual disturbances. SKIN: Notable in that  the patient has over the last week developed a diffuse rash around his  entire trunk on both sides. This has been attributed to his recent treatment  with Lasix as a diuretic, and as of yesterday he stopped taking Lasix. He  notes that this rash is not terribly itchy and does not really bother him.   PAST MEDICAL HISTORY:  1.  Mitral valve prolapse with mitral regurgitation.  2.  Atrial fibrillation, recently diagnosed and persistent.  3.  Coronary artery disease, recently diagnosed.  4.  Pulmonary hypertension.  5.  Hypertension.  6.  Hyperlipidemia.  7.  Right pleural effusion, recently treated with needle thoracentesis.  8.  Kidney stones in the distant past.   PAST SURGICAL HISTORY:  Appendectomy in the distant past.   SOCIAL HISTORY:  The patient is married and lives with his wife here in  [place]. He works as a Medical illustrator for [employer]. He has to  travel two or three days each week. He is quite active physically and has  kept himself active and has been fairly healthy all his life. He is a  nonsmoker, although he does report that he smoked briefly many years ago. He  quit smoking in 1957.  He denies history of significant alcohol consumption.   CURRENT MEDICATIONS: 1.  Pravachol 80 mg daily.  2.  Lotrel 5/20 one tablet  daily.  3.  Adult aspirin 325 mg daily.  4.  Coumadin 2.5 mg daily.  5.  Diltiazem ER 120 mg daily.  6.  Metoprolol 75 mg b.i.d.   DRUG ALLERGIES:  PENICILLIN and SULFA-CONTAINING DRUGS both cause rash and  at this point we are presuming that Lasix has caused his recent rash that  developed over the last few days.   PHYSICAL EXAMINATION:  GENERAL: The patient is a well-appearing male who  appears somewhat younger than stated age, in no acute distress.  VITAL SIGNS: Blood pressure is 128/80, pulse 65 and irregular, respirations  18 and labored. He is afebrile with oxygen saturation of 96% on room air.  HEENT: Grossly unrevealing.  NECK: Supple. There is no cervical or supraclavicular lymphadenopathy. There  is no jugular venous distention. No carotid bruits are noted.  CHEST: Auscultation of the chest reveals clear breath sounds bilaterally,  although somewhat diminished breath sounds at the right lung base. No  wheezes or rhonchi noted.  CARDIOVASCULAR: Irregular heart rhythm. There is a prominent grade 4/6  systolic murmur heard all across the precordium with radiation to the  axilla. No diastolic murmurs are noted.  ABDOMEN: Soft and nontender. There was no palpable masses. The liver edge is  not palpable or tender. Bowel sounds are present.  EXTREMITIES: Warm and well perfused. There is no lower extremity edema.  Distal pulses are palpable, although somewhat thready in both lower legs in  the posterior tibial position. There is no sign of significant venous  insufficiency.  There are some mild changes of superficial varicosities. There is no lower  extremity edema.  RECTAL/GU EXAM: Both deferred.  SKIN: Diffuse erythematous macular rash which radiates around the patient's  entire trunk, particularly along the sides. This rash does not seem to be  affecting his extremities or his face in any significant degree. There are  no open skin lesions or ulcerations.   DIAGNOSTIC TESTS:   A transesophageal echocardiogram performed by Dr. Reyes Ivan  on October 5th is reviewed. This demonstrates mitral valve prolapse with  severe (4+) mitral regurgitation. The prolapse appears to primarily afflict  the middle scalp of the posterior leaflet which essentially appears to be  almost flail. There are no obvious ruptured cord, although this certainly is  a possibility. The anterior leaflet appears normal and without significant  prolapse. The jet of mitral regurgitation courses eccentrically along the  atrial aspect of the back side of the anterior leaflet and around the  anterior wall of the left atrium. There appears to be flow reversal in the  pulmonary veins. The left atrium is dilated. The left ventricle is mildly  dilated. Overall global left ventricular function appears reasonably normal,  although this is in the presence of severe mitral regurgitation. The aortic valve appears normal. No other significant abnormalities are noted.   Cardiac catheterization performed October 6th by Dr. Reyes Ivan has been  reviewed. This demonstrates the presence of severe three-vessel coronary  artery disease with severe regurgitation and mild pulmonary hypertension.  Specifically, there is diffuse coronary artery disease throughout his all  epicardial coronary arteries. There is at least 80% stenosis of the mid left  anterior descending coronary artery. There is 80% to 90% proximal stenosis  of a large first diagonal branch. There is 50% ostial stenosis of the left  circumflex coronary artery with 60% stenosis of the mid left circumflex  coronary artery. There is co-dominate coronary circulation. The ramus  intermediate branch is very small. The first and second circumflex marginal  branches are also small and likely not large enough to be considered targets  for bypass grafting. There is a medium size third circumflex marginal  branch. There is a 70% proximal stenosis of the right coronary  artery. There  is diffuse ectasia throughout the entire right coronary artery. There is 70%  to 80% stenosis of the mid posterior descending coronary artery and 50%  proximal stenosis of the posterior descending coronary artery.  There appears to be right to left collateral filling of the distal left  anterior descending coronary artery and I believe that the distal left  anterior descending coronary artery may be occluded. Pulmonary artery  pressures notably elevated somewhat, measured 53/24 with pulmonary capillary  wedge pressure of 32. Baseline cardiac outpatient was 4.6 with cardiac index  of 2.4 using the thermodilution technique. Central venous pressure was 18.   IMPRESSION:  Mitral valve prolapse with severe mitral regurgitation, three-  vessel coronary artery disease, persistent atrial fibrillation, and  progressive symptoms of class II to class III congestive heart failure with  recent onset of large right pleural effusion likely representing a  complication of the patient's underlying heart failure. I believe that Mr.  Willemsen would best be treated by elective mitral valve repair, coronary artery  bypass graft repair, and possible additional modified Cox-Maze procedure.   PLAN:  I have outlined options at length with Mr. Ulbrich and his wife in the  office today.  Alternative treatment strategies have been reviewed. We have  specifically discussed long-term prognosis with continued medical therapy  and close observation versus the alternative of proceeding with surgery at  this juncture. He understands the rationale for planned approach for  attempted mitral valve repair with valve replacement if repair is not  technically feasible. Based on the recent transesophageal echocardiogram I  feel that there is likely a 90% chance that his valve can be repaired  successfully. He understands the need for concomitant coronary artery bypass grafting due to the presence of severe three-vessel  coronary artery disease  as discovered at the time of recent catheterization. He also understands the  relative risks and benefits of a modified Cox-Maze procedure at the time of  surgery in an effort to definitely treat his recently discovered atrial  fibrillation. He specifically accepts all associated risks of surgery  including but not limited to risk of death, stroke, myocardial infarction,  congestive heart failure, respiratory failure, pneumonia, bleeding requiring  blood transfusion, arrhythmia, heart block or bradycardia requiring  permanent pacemaker, recurrent coronary artery disease, recurrent problems  with congestive heart failure, recurrent atrial fibrillation. He understands  the high likelihood that there may be irregular heart rhythms as well as  atrial fibrillation during the early postoperative period following surgery.  He understands that he will likely need to remain on anticoagulation with  Coumadin for at least three months following surgery regardless of how well  he does clinically. All of his questions have been addressed. We have also  specifically discussed the fact that although not likely, there is a chance  that if his valve cannot be repaired that valve replacement will be  necessary. In that event, we have discussed the relative risks and benefits  of use of a mechanical prosthetic valve versus a bioprosthetic valve. We  have discussed the need for long-term anticoagulation with Coumadin if a  mechanical valve were chosen. We have discussed the possibility of valve  degeneration or failure if a bioprosthetic valve were chosen. All of his  questions have been addressed. We tentatively plan to proceed with surgery  on Tuesday, February 06, 2004.  Mr. Rossetti will discontinue taking Coumadin and  continue taking aspirin in the meantime.  He will contemplate issues further  regarding the relatively unlikely, but nonetheless possible need that we  will have to  replace his mitral valve if repair cannot be performed. He will  let us know regarding his preference with respect to use of a mechanical  valve or a bioprosthetic tissue valve if this were to occur. Finally, he  also understands that we are presuming that his recent rash has developed as  a complication of the use of Lasix.  If this rash were to progress or become  more of a serious problem, we might need to postpone surgery until it had  been sorted out completely. Presuming that he remains relatively free  of  associated symptoms and the rash gradually improves over the coming week, we  will plan to proceed with surgery as described.       CHO/MEDQ  D:  03/30/2004  T:  03/30/2004  Job:  [id number]   cc:   [name]., M.D.  [address]  [place], [place] [address]  Fax: [phone]   Al Decant. Janey Greaser, MD  [address]  [place]  [place] [address]  Fax: [phone]   Short Stay

## 2010-10-30 NOTE — Consult Note (Signed)
NAME:  Arthur [name], SAGAR NO.:  [id number]   MEDICAL RECORD NO.:  [id number]          PATIENT TYPE:  EMS   LOCATION:  MAJO                         FACILITY:  [facility]   PHYSICIAN:  Genene Churn. Love, M.D.    DATE OF BIRTH:  10-Jul-1928   DATE OF CONSULTATION:  12/29/2005  DATE OF DISCHARGE:                                   CONSULTATION   PRIMARY CARE PHYSICIAN:  Dr. Mikel Cella.   This is the second [facility] admission for this 77 year old right-  handed white married male with a 50-year history of hypertension, a 20-year  history of hyperlipidemia, a known cardiac surgery October of 2005.  He is  seen in the emergency room and evaluated for transient left-sided left arm  numbness.   HISTORY OF PRESENT ILLNESS:  Mr. Dinovo has a 20-year history of  hyperlipidemia, a 50-year history of hypertension.  In October of 2005, he  underwent cardiac surgery at [facility] for severe mitral  regurgitation.  At that time, he was found to have evidence of severe 3-  vessel coronary artery disease and atrial fibrillation.  He also had  pulmonary hypertension, hyperlipidemia, and preoperative right pleural  effusion.  He had mild renal insufficiency.  He underwent mediastinotomy for  mitral valve repair with quadrangular resection of posterior leaflet with  sliding leaflet annuloplasty and 26 mm Edwards physio-ring annuloplasty at  coronary artery bypass grafting x4 and 2-3-vessel coronary artery disease  using a left internal mammary artery to the distal left anterior descending  coronary artery saphenous vein graft to the first diagonal branch, saphenous  vein graft to the third circumflex marginal branch, and saphenous vein graft  to the posterior descending coronary artery.  He tolerated the procedure  well.  He has been in his usual state of health without any known history of  stroke until today about 4 o'clock when he noted the onset of left hand and  arm  numbness involving the entire arm without associated headache, syncope,  seizure, chest pain, or palpitations.  There was some questionable  clumsiness, but no definite weakness in the left arm.  His symptomatology  cleared in 10 minutes.  He came to the emergency room.   PAST MEDICAL HISTORY:  Significant for the coronary artery disease with 4-  vessel bypass, mitral valve repair for mitral insufficiency, transient  atrial fibrillation in the past, hyperlipidemia, hypertension, renal stones,  mild renal insufficiency, and history of appendectomy.   MEDICATIONS:  Benazepril dosage unknown, Toprol 1/2 twice per day dosage  unknown, baby aspirin once per day and Vytorin dosage unknown once per day.   PHYSICAL EXAMINATION:  GENERAL:  Well-developed white male.  VITAL SIGNS:  Blood pressure in right arm 210/100, left arm 210/100, heart  rate 74.  CARDIAC:  Normal sinus rhythm and flexion/extension maneuvers are  unremarkable.  He had a left and right supraclavicular bruit.  No other  bruits heard.  MENTAL STATUS:  Alert and oriented x3.  Scored 30 out of 30 on MMSE.  There  was no denial syndrome.  Cranial  nerve examination.  HEENT:  Visual fields were full.  Disks were flat.  The extraocular movement  are full.  Corneal's were present. There was no seventh nerve palsy.  Tongue  was midline.  Uvula was midline.  Gags were present.  EXTREMITIES:  Sternocleidomastoid and trapezius testing normal.  Motor  examination 5/5 strength where upper and lower extremities coordination  testing revealed finger-to-nose, heel-to-shin, rapid alternating movements  to be normal.  Sensory examination was intact to pinprick touch, pin  position and vibration testing. Deep tendon reflexes 1-2+.  Plantar  responses were downgoing.   LABORATORY DATA:  CT scan was normal.  Sodium 140, potassium 4.6, chloride  106, CO2 content 19, BUN 19, creatinine 1.3, glucose 106.  Protime 13.2, INR  was 1.8, PTT 27.  White  blood cell count 8500, hemoglobin 16.2, hematocrit  49.0, platelets 201 K.  CT scan of the brain that was normal.  Telemetry in  the emergency room showed normal sinus rhythm.   IMPRESSION:  1.  Right brain transient ischemic attack.  Code 425.9.  2.  Hypertension under poor control.  Code 706.2.  3.  Hyperlipidemia. Code 272.4.  4.  Three-vessel coronary artery disease status post 4-vessel coronary      artery bypass surgery in October of 2005. Code 429.2.  5.  History of atrial fibrillation.  Code 427.31.  6.  Mitral valve disease status post annuloplasty.  Code 424.0.  7.  History of chronic renal insufficiency.  8.  History of renal stones.   PLAN:  At this time, I suggest swallowing study, Aggrenox 25 at 200 daily,  MRI, MRA of the intracranial circulation, 2-D echocardiogram, Doppler of  carotids, lipid profile, and homocystine level and telemetry to rule out  recurrent atrial fibrillation.           ______________________________  Genene Churn. Sandria Manly, M.D.     JML/MEDQ  D:  12/29/2005  T:  12/30/2005  Job:  [id number]   cc:   Lyndee Leo. Janey Greaser, MD  Fax: [phone]

## 2010-10-30 NOTE — H&P (Signed)
NAME:  Arthur [name], Arthur [name] NO.:  [id number]   MEDICAL RECORD NO.:  [id number]          PATIENT TYPE:  INP   LOCATION:  3735                         FACILITY:  [facility]   PHYSICIAN:  [name]., M.D.DATE OF BIRTH:  09-23-1928   DATE OF ADMISSION:  03/17/2004  DATE OF DISCHARGE:                                HISTORY & PHYSICAL   PRIMARY CARE PHYSICIAN:  Dr. Janey Greaser.   HISTORY OF PRESENT ILLNESS:  A 77 year old white male, past medical history  of hypertension, dyslipidemia, mitral valve prolapse with moderate to severe  mitral regurgitation, who presents to primary care physician's office with  chief complaint of palpitations and heart racing.  The patient in normal  state of health until approximately three to four weeks ago.  At that time  the patient noted mild increase in dyspnea on exertion as well as  intermittent palpitations.  Reports these palpitation episodes occurring  multiple times throughout the day, typically self-limiting with their  duration.  Lasting from less than five minutes to up to 15-20 minutes at a  time.  The patient also notes mild increased dyspnea on exertion primarily  with strenuous activity.  Denies any chest pain.  Today he noticed his heart  racing, went for further evaluation after it did not resolve spontaneously.  At his primary doctor's office, he was found to be in atrial fibrillation  with rapid ventricular response and heart rates in the 150-160 range.  The  patient was then sent to the hospital for direct admission.   REVIEW OF SYSTEMS:  Positive for nonproductive cough, occasional lower  extremity edema worse at the end of the day.  Reports his weight is stable.  Denies any significant neurologic, hematologic problems.  Specifically  denies any easy bruising or bleeding.  Review of systems is otherwise  negative.   CURRENT MEDICATIONS:  1.  Lotrel 5/20 mg daily.  2.  Pravachol 80 mg daily.  3.  Aspirin 81 mg  daily.   ALLERGIES:  PENICILLIN and questionable intolerance to [facility] DRUGS.   FAMILY HISTORY:  Positive for coronary artery disease in his mother, father  with hypertension and renal failure.   SOCIAL HISTORY:  He is married.  Remote history of tobacco.  Quit in 1957.  Does have occasional alcohol use.   PHYSICAL EXAMINATION:  VITAL SIGNS:  Heart rate is 140-150 range, blood  pressure 140-150/70-80.  He is afebrile.  GENERAL:  He is an elderly-appearing white male, alert and oriented x4, in  no acute distress.  NECK:  Supple, no lymphadenopathy, 2+ carotids, no JVD, no bruits.  CHEST:  Lungs are clear.  CARDIAC:  Irregular irregular.  Patient with a 3/6 holosystolic murmur from  the apex to the left lower sternal border.  A displaced PMI, approximately  two inches from the midaxillary line.  ABDOMEN:  Soft, nontender, nondistended.  Positive bowel sounds, no rebound  or guarding.  EXTREMITIES:  Warm, 2+ pulses, and trace pedal edema.  NEUROLOGIC:  He is intact, no focal deficits.  Strength is 5/5 and equal in  all extremities.  ECG shows atrial fibrillation at 139 beats per minute, normal axis with poor  R-wave progression across the lateral precordial leads.  Routine blood work  is pending.   IMPRESSION:  1.  Atrial fibrillation with rapid ventricular response, most likely has      been occurring over the last three to four weeks.  2.  Known history of mitral valve prolapse with moderate to severe mitral      regurgitation.  3.  Hypertension.  4.  Dyslipidemia.   PLAN:  From atrial fibrillation standpoint, will start Lovenox 1 mg/kg subcu  b.i.d. for anticoagulation.  Will start rate control measures with Cardizem  15 mg bolus followed by 10 mg/hr. titrated to keep his heart rate less than  100.  Will repeat echocardiogram, since his last echo was approximately one  year ago.  Should this again show moderate to severe MR, once the patient is  rate-controlled will set him  up for right and left heart catheterization  prior to surgical evaluation for mitral valve repair.  Will check routine  lab work, including CMP, cardiac enzymes, TSH, CBC to evaluate for any  metabolic derangements.  Will continue his medications as listed above.       TWK/MEDQ  D:  03/17/2004  T:  03/17/2004  Job:  [id number]

## 2010-11-27 ENCOUNTER — Encounter: Payer: Self-pay | Admitting: Cardiology

## 2010-12-03 ENCOUNTER — Ambulatory Visit (INDEPENDENT_AMBULATORY_CARE_PROVIDER_SITE_OTHER): Payer: BC Managed Care – PPO | Admitting: Cardiology

## 2010-12-03 ENCOUNTER — Encounter: Payer: Self-pay | Admitting: Cardiology

## 2010-12-03 VITALS — BP 120/80 | HR 66 | Wt 174.0 lb

## 2010-12-03 DIAGNOSIS — I639 Cerebral infarction, unspecified: Secondary | ICD-10-CM

## 2010-12-03 DIAGNOSIS — I480 Paroxysmal atrial fibrillation: Secondary | ICD-10-CM | POA: Insufficient documentation

## 2010-12-03 DIAGNOSIS — I4891 Unspecified atrial fibrillation: Secondary | ICD-10-CM

## 2010-12-03 DIAGNOSIS — I059 Rheumatic mitral valve disease, unspecified: Secondary | ICD-10-CM

## 2010-12-03 DIAGNOSIS — I341 Nonrheumatic mitral (valve) prolapse: Secondary | ICD-10-CM | POA: Insufficient documentation

## 2010-12-03 DIAGNOSIS — I6350 Cerebral infarction due to unspecified occlusion or stenosis of unspecified cerebral artery: Secondary | ICD-10-CM

## 2010-12-03 DIAGNOSIS — E785 Hyperlipidemia, unspecified: Secondary | ICD-10-CM | POA: Insufficient documentation

## 2010-12-03 DIAGNOSIS — I10 Essential (primary) hypertension: Secondary | ICD-10-CM | POA: Insufficient documentation

## 2010-12-03 DIAGNOSIS — N189 Chronic kidney disease, unspecified: Secondary | ICD-10-CM

## 2010-12-03 DIAGNOSIS — I2510 Atherosclerotic heart disease of native coronary artery without angina pectoris: Secondary | ICD-10-CM

## 2010-12-03 DIAGNOSIS — I63411 Cerebral infarction due to embolism of right middle cerebral artery: Secondary | ICD-10-CM | POA: Insufficient documentation

## 2010-12-03 NOTE — Progress Notes (Signed)
   Arthur [name] Date of Birth: 1928-06-20   History of Present Illness: Arthur [name] is seen for followup today. He has been doing very well from a cardiac standpoint. He denies any symptoms of chest pain, shortness of breath, or palpitations. He states that he gets around okay. He doesn't do any real formal exercise. His only significant medical issue this past year is that he passed a kidney stone. In March of this year his simvastatin dose was reduced given the fact that he is also on amlodipine.  Current Outpatient Prescriptions on File Prior to Visit  Medication Sig Dispense Refill  . amLODipine (NORVASC) 5 MG tablet Take 5 mg by mouth daily.        Marland Kitchen aspirin 81 MG tablet Take 81 mg by mouth daily.        . benazepril (LOTENSIN) 40 MG tablet Take 1 tablet (40 mg total) by mouth daily.  90 tablet  3  . clopidogrel (PLAVIX) 75 MG tablet Take 75 mg by mouth daily.        . metoprolol tartrate (LOPRESSOR) 25 MG tablet Take 25 mg by mouth 2 (two) times daily.        . ranitidine (ZANTAC) 150 MG tablet Take 150 mg by mouth 2 (two) times daily.        . simvastatin (ZOCOR) 80 MG tablet Take 0.5 tablets (40 mg total) by mouth at bedtime.  30 tablet  0    Allergies  Allergen Reactions  . Lasix (Furosemide) Rash  . Penicillins Rash  . Sulfa Drugs Cross Reactors Rash    Past Medical History  Diagnosis Date  . Hyperlipidemia   . Hypertension   . Coronary artery disease   . MVP (mitral valve prolapse)   . Atrial fibrillation   . Chronic renal insufficiency   . History of recurrent TIAs   . Hypertensive vascular disease   . CVA (cerebrovascular accident)     RIGHT BRAIN    Past Surgical History  Procedure Date  . Cardiac catheterization 03/19/2004  . Maze 2005  . Coronary artery bypass graft 2005    LIMA GRAFT TO THE DISTAL LAD, SAPHENOUS VEIN GRAFT TO THE FIRST DIADGONAL BRANCH, SAPHENOUS VEIN GRAFT TO THE THIRD MARIGINAL BRANCH, AND SAPHENOUS VEIN GRAFT TO THE PDA  . Mitral valve  repair     History  Smoking status  . Former Smoker  . Quit date: 06/15/1955  Smokeless tobacco  . Not on file    History  Alcohol Use No    Family History  Problem Relation Age of Onset  . Heart attack Mother   . Stroke Father     Review of Systems: The review of systems is positive for passing a kidney stone.  All other systems were reviewed and are negative.  Physical Exam: BP 120/80  Pulse 66  Wt 174 lb (78.926 kg) He is a pleasant elderly male who appears younger than his stated age. HEENT normocephalic, atraumatic. Pupils are equal round and reactive to light and accommodation. Sclera clear. Oropharynx is clear. Neck is without JVD, adenopathy, thyromegaly, or bruits. Lungs are clear. Cardiac exam reveals a regular rate and rhythm without gallop, murmur, or click. Abdomen is soft and nontender without masses or bruits. His femoral and pedal pulses are 2+. He has no edema. He is alert and oriented x3 and cranial nerves II through XII are intact. LABORATORY DATA:   Assessment / Plan:

## 2010-12-03 NOTE — Assessment & Plan Note (Signed)
He is asymptomatic. He is on appropriate medications. His last stress test was in 2008. We discussed followup stress testing since he is now 7 years out from his bypass surgery. He would like to defer this at the present time. Since she is 77 years old and asymptomatic I think it would be fine to defer this.

## 2010-12-03 NOTE — Assessment & Plan Note (Signed)
We'll schedule him for followup fasting lab work since we had reduced his simvastatin dose. If he is not at target we may need to consider alternative statin therapy.

## 2010-12-03 NOTE — Assessment & Plan Note (Signed)
Status post maze procedure in 2005. No evidence of recurrence.

## 2010-12-03 NOTE — Assessment & Plan Note (Signed)
Blood pressures under good control. Will continue with his current medical therapy.

## 2010-12-03 NOTE — Patient Instructions (Addendum)
We will schedule you for fasting blood work.  Continue your current medications.  I will see you back in 1 year.

## 2010-12-09 ENCOUNTER — Other Ambulatory Visit: Payer: Self-pay | Admitting: *Deleted

## 2010-12-09 MED ORDER — RANITIDINE HCL 150 MG PO TABS: 150.0000 mg | ORAL_TABLET | Freq: Two times a day (BID) | ORAL | Status: DC

## 2010-12-09 NOTE — Telephone Encounter (Signed)
escribe medication per fax request  

## 2010-12-11 ENCOUNTER — Other Ambulatory Visit (INDEPENDENT_AMBULATORY_CARE_PROVIDER_SITE_OTHER): Payer: BC Managed Care – PPO | Admitting: *Deleted

## 2010-12-11 DIAGNOSIS — I2510 Atherosclerotic heart disease of native coronary artery without angina pectoris: Secondary | ICD-10-CM

## 2010-12-11 LAB — HEPATIC FUNCTION PANEL
ALT: 21 U/L (ref 0–53)
AST: 31 U/L (ref 0–37)
Albumin: 4.5 g/dL (ref 3.5–5.2)
Alkaline Phosphatase: 58 U/L (ref 39–117)

## 2010-12-11 LAB — BASIC METABOLIC PANEL
CO2: 27 mEq/L (ref 19–32)
Chloride: 103 mEq/L (ref 96–112)
GFR: 59.04 mL/min — ABNORMAL LOW (ref >60.00)
Glucose, Bld: 95 mg/dL (ref 70–99)
Potassium: 4 mEq/L (ref 3.5–5.1)
Sodium: 137 mEq/L (ref 135–145)

## 2010-12-11 LAB — LIPID PANEL: VLDL: 19.2 mg/dL (ref 0.0–40.0)

## 2010-12-11 LAB — CBC WITH DIFFERENTIAL/PLATELET
Basophils Absolute: 0 10*3/uL (ref 0.0–0.1)
HCT: 45.2 % (ref 39.0–52.0)
Lymphs Abs: 1.4 10*3/uL (ref 0.7–4.0)
MCV: 92 fl (ref 78.0–100.0)
Monocytes Absolute: 0.5 10*3/uL (ref 0.1–1.0)
Monocytes Relative: 7.8 % (ref 3.0–12.0)
Platelets: 204 10*3/uL (ref 150.0–400.0)
RDW: 14.1 % (ref 11.5–14.6)

## 2010-12-14 ENCOUNTER — Other Ambulatory Visit: Payer: Self-pay | Admitting: Cardiology

## 2010-12-14 MED ORDER — SIMVASTATIN 40 MG PO TABS: ORAL_TABLET | ORAL | Status: DC

## 2010-12-14 NOTE — Telephone Encounter (Signed)
Med refill

## 2010-12-15 ENCOUNTER — Telehealth: Payer: Self-pay | Admitting: *Deleted

## 2010-12-15 NOTE — Telephone Encounter (Signed)
Message copied by Lorayne Bender on Tue Dec 15, 2010 10:04 AM ------      Message from: [place], PETER M      Created: Fri Dec 11, 2010  3:16 PM       Labs all look good. Continue current RX.

## 2010-12-15 NOTE — Telephone Encounter (Signed)
Notified of lab results. Copy sent to Dr. Clarene Duke

## 2011-05-13 ENCOUNTER — Inpatient Hospital Stay (HOSPITAL_COMMUNITY)
Admission: EM | Admit: 2011-05-13 | Discharge: 2011-05-16 | DRG: 066 | Disposition: A | Discharge status: Home / Self Care | Payer: Medicare Other | Attending: Internal Medicine | Admitting: Internal Medicine

## 2011-05-13 ENCOUNTER — Encounter (HOSPITAL_COMMUNITY): Payer: Self-pay | Admitting: *Deleted

## 2011-05-13 ENCOUNTER — Emergency Department (HOSPITAL_COMMUNITY): Payer: Medicare Other

## 2011-05-13 DIAGNOSIS — Z7902 Long term (current) use of antithrombotics/antiplatelets: Secondary | ICD-10-CM

## 2011-05-13 DIAGNOSIS — I129 Hypertensive chronic kidney disease with stage 1 through stage 4 chronic kidney disease, or unspecified chronic kidney disease: Secondary | ICD-10-CM | POA: Diagnosis present

## 2011-05-13 DIAGNOSIS — I440 Atrioventricular block, first degree: Secondary | ICD-10-CM

## 2011-05-13 DIAGNOSIS — R42 Dizziness and giddiness: Secondary | ICD-10-CM | POA: Diagnosis present

## 2011-05-13 DIAGNOSIS — I480 Paroxysmal atrial fibrillation: Secondary | ICD-10-CM | POA: Diagnosis present

## 2011-05-13 DIAGNOSIS — Z951 Presence of aortocoronary bypass graft: Secondary | ICD-10-CM

## 2011-05-13 DIAGNOSIS — I2510 Atherosclerotic heart disease of native coronary artery without angina pectoris: Secondary | ICD-10-CM | POA: Diagnosis present

## 2011-05-13 DIAGNOSIS — Z7982 Long term (current) use of aspirin: Secondary | ICD-10-CM

## 2011-05-13 DIAGNOSIS — I6340 Cerebral infarction due to embolism of unspecified cerebral artery: Principal | ICD-10-CM | POA: Diagnosis present

## 2011-05-13 DIAGNOSIS — E785 Hyperlipidemia, unspecified: Secondary | ICD-10-CM | POA: Diagnosis present

## 2011-05-13 DIAGNOSIS — I10 Essential (primary) hypertension: Secondary | ICD-10-CM | POA: Diagnosis present

## 2011-05-13 DIAGNOSIS — Z8673 Personal history of transient ischemic attack (TIA), and cerebral infarction without residual deficits: Secondary | ICD-10-CM

## 2011-05-13 DIAGNOSIS — Z79899 Other long term (current) drug therapy: Secondary | ICD-10-CM

## 2011-05-13 DIAGNOSIS — I639 Cerebral infarction, unspecified: Secondary | ICD-10-CM | POA: Diagnosis present

## 2011-05-13 DIAGNOSIS — N189 Chronic kidney disease, unspecified: Secondary | ICD-10-CM | POA: Diagnosis present

## 2011-05-13 DIAGNOSIS — R55 Syncope and collapse: Secondary | ICD-10-CM

## 2011-05-13 LAB — CBC
HCT: 45.6 % (ref 39.0–52.0)
MCV: 90.1 fL (ref 78.0–100.0)
Platelets: 214 10*3/uL (ref 150–400)
RBC: 5.06 MIL/uL (ref 4.22–5.81)
RDW: 12.9 % (ref 11.5–15.5)
WBC: 11.3 10*3/uL — ABNORMAL HIGH (ref 4.0–10.5)

## 2011-05-13 LAB — COMPREHENSIVE METABOLIC PANEL
AST: 25 U/L (ref 0–37)
Albumin: 4.2 g/dL (ref 3.5–5.2)
Alkaline Phosphatase: 67 U/L (ref 39–117)
BUN: 25 mg/dL — ABNORMAL HIGH (ref 6–23)
CO2: 25 mEq/L (ref 19–32)
Chloride: 102 mEq/L (ref 96–112)
Creatinine, Ser: 1.1 mg/dL (ref 0.50–1.35)
GFR calc non Af Amer: 61 mL/min — ABNORMAL LOW (ref >90)
Potassium: 4 mEq/L (ref 3.5–5.1)
Total Bilirubin: 0.4 mg/dL (ref 0.3–1.2)

## 2011-05-13 LAB — POCT I-STAT TROPONIN I: Troponin i, poc: 0.02 ng/mL (ref 0.00–0.08)

## 2011-05-13 LAB — URINALYSIS, ROUTINE W REFLEX MICROSCOPIC
Bilirubin Urine: NEGATIVE
Glucose, UA: NEGATIVE mg/dL
Ketones, ur: NEGATIVE mg/dL
Protein, ur: NEGATIVE mg/dL
pH: 7.5 (ref 5.0–8.0)

## 2011-05-13 NOTE — ED Notes (Signed)
Per EMS: Pt ate dinner started feeling Nauseated and vomited once per pt. Pt also felt dizzy. Per EMS pt diaphoretic upon arrival. Pt denied numbness or pain or LOC. Pt has a 20g LFA. Pt given 4mg  of zofran en route.

## 2011-05-13 NOTE — ED Notes (Signed)
Paged Hospitalist to [phone]

## 2011-05-13 NOTE — ED Notes (Signed)
CBG 146 

## 2011-05-13 NOTE — ED Notes (Signed)
Pt states that he ate 2-3 bites of pizza this evening for dinner and the was sitting in his chair and all of a sudden felt real dizzy and nauseated. Pt states that he vomited and was going to call 911, because every time he sat up and the room was spinning. Pt family states that he was attempting to walk to the front door and he was having to use the wall and objects to walk which is not his norm. Pt alert and oriented able to follow commands and move extremities. No facial droop, grip strength equal and strong. Pt able to lift legs independently and hold without difficulty.

## 2011-05-13 NOTE — ED Provider Notes (Signed)
History     CSN: [id number] Arrival date & time: 05/13/2011  9:10 PM   First MD Initiated Contact with Patient 05/13/11 2126      Chief Complaint  Patient presents with  . Dizziness    (Consider location/radiation/quality/duration/timing/severity/associated sxs/prior treatment) HPI Patient presents with complaint of lightheadedness and near syncope. He states the symptoms began approximately 8:00 tonight while he was sitting on the couch watching television. He had eaten a normal dinner prior. He states he felt lightheaded and nearly fainted. He felt nauseated and broke into a sweat. He did have emesis x1. He then tried to stand and felt very lightheaded and unstable. He denies any vertigo or sensation of his surroundings spinning. He denies any changes in vision or weakness in any extremities. He denies any chest pain or shortness of breath. He states he was in his usual state of health all day until symptoms began acutely. At the time of arrival to the ED he denies any complaints. He has not had similar symptoms previously. There no other associated symptoms. Nothing makes his symptoms better or worse  Past Medical History  Diagnosis Date  . Hyperlipidemia   . Hypertension   . Coronary artery disease   . MVP (mitral valve prolapse)   . Atrial fibrillation   . Chronic renal insufficiency   . History of recurrent TIAs   . Hypertensive vascular disease   . CVA (cerebrovascular accident)     RIGHT BRAIN    Past Surgical History  Procedure Date  . Cardiac catheterization 03/19/2004  . Maze 2005  . Coronary artery bypass graft 2005    LIMA GRAFT TO THE DISTAL LAD, SAPHENOUS VEIN GRAFT TO THE FIRST DIADGONAL BRANCH, SAPHENOUS VEIN GRAFT TO THE THIRD MARIGINAL BRANCH, AND SAPHENOUS VEIN GRAFT TO THE PDA  . Mitral valve repair     Family History  Problem Relation Age of Onset  . Heart attack Mother   . Stroke Father     History  Substance Use Topics  . Smoking status: Former  Smoker    Quit date: 06/15/1955  . Smokeless tobacco: Not on file  . Alcohol Use: No      Review of Systems ROS reviewed and otherwise negative except for mentioned in HPI  Allergies  Lasix; Penicillins; and Sulfa drugs cross reactors  Home Medications   Current Outpatient Rx  Name Route Sig Dispense Refill  . AMLODIPINE BESYLATE 5 MG PO TABS Oral Take 5 mg by mouth daily.      . ASPIRIN 81 MG PO TABS Oral Take 81 mg by mouth daily.      Marland Kitchen BENAZEPRIL HCL 40 MG PO TABS Oral Take 1 tablet (40 mg total) by mouth daily. 90 tablet 3  . CLOPIDOGREL BISULFATE 75 MG PO TABS Oral Take 75 mg by mouth daily.      . CYCLOBENZAPRINE HCL 10 MG PO TABS Oral Take 10 mg by mouth every 8 (eight) hours as needed. For pain.     . ASPIRIN-DIPYRIDAMOLE 25-200 MG PO CP12 Oral Take 1 capsule by mouth 2 (two) times daily.      Marland Kitchen METOPROLOL TARTRATE 25 MG PO TABS Oral Take 25 mg by mouth 2 (two) times daily.      Marland Kitchen RANITIDINE HCL 150 MG PO TABS Oral Take 1 tablet (150 mg total) by mouth 2 (two) times daily. 60 tablet 5  . SIMVASTATIN 40 MG PO TABS  Take one tablet by mouth daily. 90 tablet 3  BP 155/105  Pulse 70  Temp(Src) 97.5 F (36.4 C) (Oral)  Resp 16  SpO2 98% Vitals reviewed Physical Exam Physical Examination: General appearance - alert, well appearing, and in no distress Mental status - alert, oriented to person, place, and time Mouth - mucous membranes moist, pharynx normal without lesions Chest - clear to auscultation, no wheezes, rales or rhonchi, symmetric air entry Heart - normal rate, regular rhythm, normal S1, S2, no murmurs, rubs, clicks or gallops Abdomen - soft, nontender, nondistended, no masses or organomegaly Neurological - alert, oriented, normal speech, no focal findings, CN 2-12 tested and intact, strength 5/5 in extremities x 4, sensation intact in ext x 4 Musculoskeletal - no joint tenderness, deformity or swelling Extremities - peripheral pulses normal, no pedal edema,  no clubbing or cyanosis Skin - normal coloration and turgor, no rashes  ED Course  Procedures (including critical care time)  Date: 05/13/2011  Rate: 73  Rhythm: SR with first degree AV block  QRS Axis: normal  Intervals: normal  ST/T Wave abnormalities: nonspecific ST/T changes  Conduction Disutrbances:first-degree A-V block , anterior q waves  Narrative Interpretation:   Old EKG Reviewed: PR prolonged since prior EKG of 12/30/2005    Labs Reviewed  CBC - Abnormal; Notable for the following:    WBC 11.3 (*)    All other components within normal limits  COMPREHENSIVE METABOLIC PANEL - Abnormal; Notable for the following:    Glucose, Bld 146 (*)    BUN 25 (*)    GFR calc non Af Amer 61 (*)    GFR calc Af Amer 71 (*)    All other components within normal limits  GLUCOSE, CAPILLARY - Abnormal; Notable for the following:    Glucose-Capillary 146 (*)    All other components within normal limits  LIPASE, BLOOD  URINALYSIS, ROUTINE W REFLEX MICROSCOPIC  POCT I-STAT TROPONIN I  I-STAT TROPONIN I  POCT CBG MONITORING   Dg Chest 2 View  05/13/2011  *RADIOLOGY REPORT*  Clinical Data: Near-syncope; nausea and vomiting.  CHEST - 2 VIEW  Comparison: Chest radiograph performed 12/29/2005  Findings: The lungs are well-aerated.  There is stable right basilar scarring and associated atelectasis, with underlying pleural thickening.  There is no evidence of focal opacification, pleural effusion or pneumothorax.  The heart is normal in size; the patient is status post median sternotomy, with evidence of prior CABG.  A mitral valve replacement is noted.  No acute osseous abnormalities are seen.  A small to moderate hiatal hernia is seen.  IMPRESSION:  1.  No acute cardiopulmonary process seen. 2.  Stable right basilar pleural thickening and scarring, with associated atelectasis. 3.  Small to moderate hiatal hernia again noted.  Original Report Authenticated By: Tonia Ghent, M.D.   Ct Head Wo  Contrast  05/13/2011  *RADIOLOGY REPORT*  Clinical Data: Dizziness; history of TIA.  CT HEAD WITHOUT CONTRAST  Technique:  Contiguous axial images were obtained from the base of the skull through the vertex without contrast.  Comparison: CT of the head performed 12/29/2005, and MRI of the brain performed 10/30/2009  Findings: There is no evidence of acute infarction, mass lesion, or intra- or extra-axial hemorrhage on CT.  A focus of chronic encephalomalacia is noted at the high right parietal lobe, stable from the prior MRI.  An additional focus of encephalomalacia is noted at the high left frontoparietal region, also stable in appearance.  The posterior fossa, including the cerebellum, brainstem and fourth ventricle, is within normal  limits.  The third and lateral ventricles, and basal ganglia are unremarkable in appearance.  No mass effect or midline shift is seen.  There is no evidence of fracture; visualized osseous structures are unremarkable in appearance.  The visualized portions of the orbits are within normal limits.  The paranasal sinuses and mastoid air cells are well-aerated.  No significant soft tissue abnormalities are seen.  IMPRESSION:  1.  No acute intracranial pathology seen on CT. 2.  Small foci of chronic encephalomalacia noted bilaterally, stable from the prior MRI.  Original Report Authenticated By: Tonia Ghent, M.D.     1. Near syncope   2. First degree AV block     11:52 PM- d/w Dr. Toniann Fail, Pt to be admitted to [facility], [facility] 6, [facility].    MDM  Patient presenting with episode of near syncope associated with diaphoresis nausea or vomiting. The symptoms have resolved upon ED arrival. Workup in the ED included lab testing, EKG, chest x-ray, head CT. EKG showed first degree AV block. Patient is not orthostatic and has no symptoms that would result in dehydration. He denies any chest pain or actual syncope. Patient admitted to try hospitalist service for further evaluation and  management.        Ethelda Chick, MD 05/14/11 [phone]

## 2011-05-14 ENCOUNTER — Inpatient Hospital Stay (HOSPITAL_COMMUNITY): Payer: Medicare Other

## 2011-05-14 ENCOUNTER — Encounter (HOSPITAL_COMMUNITY): Payer: Self-pay | Admitting: Internal Medicine

## 2011-05-14 DIAGNOSIS — I639 Cerebral infarction, unspecified: Secondary | ICD-10-CM | POA: Diagnosis present

## 2011-05-14 DIAGNOSIS — R42 Dizziness and giddiness: Secondary | ICD-10-CM | POA: Diagnosis present

## 2011-05-14 LAB — COMPREHENSIVE METABOLIC PANEL
ALT: 17 U/L (ref 0–53)
AST: 22 U/L (ref 0–37)
Alkaline Phosphatase: 63 U/L (ref 39–117)
CO2: 26 mEq/L (ref 19–32)
Calcium: 9.7 mg/dL (ref 8.4–10.5)
GFR calc Af Amer: 73 mL/min — ABNORMAL LOW (ref >90)
Glucose, Bld: 158 mg/dL — ABNORMAL HIGH (ref 70–99)
Potassium: 4.7 mEq/L (ref 3.5–5.1)
Sodium: 139 mEq/L (ref 135–145)
Total Protein: 7.2 g/dL (ref 6.0–8.3)

## 2011-05-14 LAB — CARDIAC PANEL(CRET KIN+CKTOT+MB+TROPI)
CK, MB: 3.6 ng/mL (ref 0.3–4.0)
CK, MB: 2.8 ng/mL (ref 0.3–4.0)
Relative Index: 1.5 (ref 0.0–2.5)
Total CK: 184 U/L (ref 7–232)
Total CK: 188 U/L (ref 7–232)
Troponin I: <0.3 ng/mL (ref <0.30)

## 2011-05-14 LAB — HEMOGLOBIN A1C: Hgb A1c MFr Bld: 5.6 % (ref <5.7)

## 2011-05-14 LAB — CBC
HCT: 45.8 % (ref 39.0–52.0)
Hemoglobin: 15.4 g/dL (ref 13.0–17.0)
MCH: 30.3 pg (ref 26.0–34.0)
MCHC: 33.6 g/dL (ref 30.0–36.0)
RDW: 12.9 % (ref 11.5–15.5)

## 2011-05-14 LAB — MAGNESIUM: Magnesium: 2.1 mg/dL (ref 1.5–2.5)

## 2011-05-14 MED ORDER — FAMOTIDINE 20 MG PO TABS
20.0000 mg | ORAL_TABLET | Freq: Every day | ORAL | Status: DC
  Administered 2011-05-14 – 2011-05-16 (×3): 20 mg via ORAL
  Filled 2011-05-14 (×3): qty 1

## 2011-05-14 MED ORDER — BENAZEPRIL HCL 40 MG PO TABS
40.0000 mg | ORAL_TABLET | Freq: Every day | ORAL | Status: DC
  Administered 2011-05-14 – 2011-05-16 (×3): 40 mg via ORAL
  Filled 2011-05-14 (×3): qty 1

## 2011-05-14 MED ORDER — ACETAMINOPHEN 325 MG PO TABS: 650.0000 mg | ORAL_TABLET | Freq: Four times a day (QID) | ORAL | Status: DC | PRN

## 2011-05-14 MED ORDER — ACETAMINOPHEN 650 MG RE SUPP: 650.0000 mg | Freq: Four times a day (QID) | RECTAL | Status: DC | PRN

## 2011-05-14 MED ORDER — ASPIRIN 81 MG PO TABS: 81.0000 mg | ORAL_TABLET | Freq: Every day | ORAL | Status: DC

## 2011-05-14 MED ORDER — CLOPIDOGREL BISULFATE 75 MG PO TABS
75.0000 mg | ORAL_TABLET | Freq: Every day | ORAL | Status: DC
  Administered 2011-05-14 – 2011-05-16 (×3): 75 mg via ORAL
  Filled 2011-05-14 (×4): qty 1

## 2011-05-14 MED ORDER — SODIUM CHLORIDE 0.9 % IV SOLN
INTRAVENOUS | Status: DC
  Administered 2011-05-14: 1000 mL via INTRAVENOUS
  Administered 2011-05-16: 06:00:00 via INTRAVENOUS

## 2011-05-14 MED ORDER — ONDANSETRON HCL 4 MG PO TABS: 4.0000 mg | ORAL_TABLET | Freq: Four times a day (QID) | ORAL | Status: DC | PRN

## 2011-05-14 MED ORDER — AMLODIPINE BESYLATE 5 MG PO TABS
5.0000 mg | ORAL_TABLET | Freq: Every day | ORAL | Status: DC
  Administered 2011-05-14 – 2011-05-16 (×3): 5 mg via ORAL
  Filled 2011-05-14 (×3): qty 1

## 2011-05-14 MED ORDER — CYCLOBENZAPRINE HCL 10 MG PO TABS: 10.0000 mg | ORAL_TABLET | Freq: Three times a day (TID) | ORAL | Status: DC | PRN

## 2011-05-14 MED ORDER — METOPROLOL TARTRATE 25 MG PO TABS
25.0000 mg | ORAL_TABLET | Freq: Two times a day (BID) | ORAL | Status: DC
  Administered 2011-05-14 – 2011-05-16 (×5): 25 mg via ORAL
  Filled 2011-05-14 (×7): qty 1

## 2011-05-14 MED ORDER — ENOXAPARIN SODIUM 40 MG/0.4ML ~~LOC~~ SOLN
40.0000 mg | SUBCUTANEOUS | Status: DC
  Administered 2011-05-14 – 2011-05-15 (×2): 40 mg via SUBCUTANEOUS
  Filled 2011-05-14 (×4): qty 0.4

## 2011-05-14 MED ORDER — IOHEXOL 350 MG/ML SOLN
75.0000 mL | Freq: Once | INTRAVENOUS | Status: AC | PRN
  Administered 2011-05-14: 75 mL via INTRAVENOUS

## 2011-05-14 MED ORDER — ONDANSETRON HCL 4 MG/2ML IJ SOLN: 4.0000 mg | Freq: Four times a day (QID) | INTRAMUSCULAR | Status: DC | PRN

## 2011-05-14 MED ORDER — ASPIRIN 81 MG PO CHEW
81.0000 mg | CHEWABLE_TABLET | Freq: Every day | ORAL | Status: DC
  Administered 2011-05-14 – 2011-05-16 (×3): 81 mg via ORAL
  Filled 2011-05-14 (×3): qty 1

## 2011-05-14 MED ORDER — ROSUVASTATIN CALCIUM 20 MG PO TABS
20.0000 mg | ORAL_TABLET | Freq: Every day | ORAL | Status: DC
  Administered 2011-05-14 – 2011-05-16 (×3): 20 mg via ORAL
  Filled 2011-05-14 (×3): qty 1

## 2011-05-14 NOTE — Progress Notes (Signed)
Utilization review complete 

## 2011-05-14 NOTE — ED Notes (Signed)
Pt family for update Harriett Sine [phone]

## 2011-05-14 NOTE — Consult Note (Signed)
Chief Complaint: dizziness  HPI: Arthur [name] is an 77 y.o. male  has a past medical history of Hyperlipidemia; Hypertension; Coronary artery disease; MVP (mitral valve prolapse); Atrial fibrillation; Chronic renal insufficiency; History of recurrent TIAs; Hypertensive vascular disease; and CVA (cerebrovascular accident). Per previous charting on April 07, 2004, he underwent median sternotomy for mitral valve repair (quadrangular resection of the posterior leaflet with sliding leaflet annuloplasty and 26 mm Edwards Physio-Ring annuloplasty). Last night at 8 pm he was sitting and watching TV when he had a sudden onset of "feeling dizzy"  This lasted for about 20 minutes in which time he had N/V.  His wife returned home and noted he was staggering when walking.  He feels he was going to the Left>right.  Patient was brought to the ED. Patient arrived at the ED at 11:21 and symptoms had all resolved.  He now feels back to baseline other than some slight off balance feeling when walking but this is improving.  LSN: 8 pm 05/13/2011 tPA Given: No: out of window  Past Medical History  Diagnosis Date  . Hyperlipidemia   . Hypertension   . Coronary artery disease   . MVP (mitral valve prolapse)   . Atrial fibrillation   . Chronic renal insufficiency   . History of recurrent TIAs   . Hypertensive vascular disease   . CVA (cerebrovascular accident)     RIGHT BRAIN    Past Surgical History  Procedure Date  . Cardiac catheterization 03/19/2004  . Maze 2005  . Coronary artery bypass graft 2005    LIMA GRAFT TO THE DISTAL LAD, SAPHENOUS VEIN GRAFT TO THE FIRST DIADGONAL BRANCH, SAPHENOUS VEIN GRAFT TO THE THIRD MARIGINAL BRANCH, AND SAPHENOUS VEIN GRAFT TO THE PDA  . Mitral valve repair     Family History  Problem Relation Age of Onset  . Heart attack Mother   . Stroke Father    Social History:  reports that he quit smoking about 55 years ago. He does not have any smokeless tobacco  history on file. He reports that he does not drink alcohol or use illicit drugs.  Allergies:  Allergies  Allergen Reactions  . Lasix (Furosemide) Rash  . Penicillins Rash  . Sulfa Drugs Cross Reactors Rash    Medications:  Prior to Admission:  Prescriptions prior to admission  Medication Sig Dispense Refill  . amLODipine (NORVASC) 5 MG tablet Take 5 mg by mouth daily.        Marland Kitchen aspirin 81 MG tablet Take 81 mg by mouth daily.        . benazepril (LOTENSIN) 40 MG tablet Take 1 tablet (40 mg total) by mouth daily.  90 tablet  3  . clopidogrel (PLAVIX) 75 MG tablet Take 75 mg by mouth daily.        . cyclobenzaprine (FLEXERIL) 10 MG tablet Take 10 mg by mouth every 8 (eight) hours as needed. For pain.       .        . metoprolol tartrate (LOPRESSOR) 25 MG tablet Take 25 mg by mouth 2 (two) times daily.        . ranitidine (ZANTAC) 150 MG tablet Take 1 tablet (150 mg total) by mouth 2 (two) times daily.  60 tablet  5  . simvastatin (ZOCOR) 40 MG tablet Take one tablet by mouth daily.  90 tablet  3   Scheduled:   . amLODipine  5 mg Oral Daily  . aspirin  81 mg Oral Daily  . benazepril  40 mg Oral Daily  . clopidogrel  75 mg Oral Q breakfast  . famotidine  20 mg Oral Daily  . metoprolol tartrate  25 mg Oral BID  . rosuvastatin  20 mg Oral Daily  . DISCONTD: aspirin  81 mg Oral Daily    ROS: History obtained from the patient  General ROS: negative for - chills, fatigue, fever, night sweats, weight gain or weight loss Psychological ROS: negative for - behavioral disorder, hallucinations, memory difficulties, mood swings or suicidal ideation Ophthalmic ROS: negative for - blurry vision, double vision, eye pain or loss of vision ENT ROS: negative for - epistaxis, nasal discharge, oral lesions, sore throat, tinnitus or vertigo Allergy and Immunology ROS: negative for - hives or itchy/watery eyes Hematological and Lymphatic ROS: negative for - bleeding problems, bruising or swollen  lymph nodes Endocrine ROS: negative for - galactorrhea, hair pattern changes, polydipsia/polyuria or temperature intolerance Respiratory ROS: negative for - cough, hemoptysis, shortness of breath or wheezing Cardiovascular ROS: negative for - chest pain, dyspnea on exertion, edema or irregular heartbeat Gastrointestinal ROS: negative for - abdominal pain, diarrhea, hematemesis, nausea/vomiting or stool incontinence Genito-Urinary ROS: negative for - dysuria, hematuria, incontinence or urinary frequency/urgency Musculoskeletal ROS: negative for - joint swelling or muscular weakness Neurological ROS: as noted in HPI Dermatological ROS: negative for rash and skin lesion changes   Physical Examination: Blood pressure 146/81, pulse 67, temperature 97.8 F (36.6 C), temperature source Oral, resp. rate 18, weight 83.734 kg (184 lb 9.6 oz), SpO2 99.00%.  Neurologic Examination: Mental Status: Alert, oriented, thought content appropriate.  Speech fluent without evidence of aphasia. Able to follow 3 step commands without difficulty. Cranial Nerves: II-Visual fields grossly intact. III/IV/VI-Extraocular movements intact.  Pupils reactive bilaterally. V/VII-Smile symmetric VIII-grossly intact IX/X-normal gag XI-bilateral shoulder shrug XII-midline tongue extension Motor: 5/5 bilaterally with normal tone and bulk Sensory: Pinprick and light touch intact throughout, bilaterally Deep Tendon Reflexes: 3+ and symmetric throughout Plantars: Downgoing bilaterally Cerebellar: Normal finger-to-nose, normal rapid alternating movements and normal heel-to-shin test.  Normal gait and station.     Results for orders placed during the hospital encounter of 05/13/11 (from the past 48 hour(s))  URINALYSIS, ROUTINE W REFLEX MICROSCOPIC     Status: Normal   Collection Time   05/13/11 10:00 PM      Component Value Range Comment   Color, Urine YELLOW  YELLOW     APPearance CLEAR  CLEAR     Specific Gravity,  Urine 1.009  1.005 - 1.030     pH 7.5  5.0 - 8.0     Glucose, UA NEGATIVE  NEGATIVE (mg/dL)    Hgb urine dipstick NEGATIVE  NEGATIVE     Bilirubin Urine NEGATIVE  NEGATIVE     Ketones, ur NEGATIVE  NEGATIVE (mg/dL)    Protein, ur NEGATIVE  NEGATIVE (mg/dL)    Urobilinogen, UA 1.0  0.0 - 1.0 (mg/dL)    Nitrite NEGATIVE  NEGATIVE     Leukocytes, UA NEGATIVE  NEGATIVE  MICROSCOPIC NOT DONE ON URINES WITH NEGATIVE PROTEIN, BLOOD, LEUKOCYTES, NITRITE, OR GLUCOSE <1000 mg/dL.  GLUCOSE, CAPILLARY     Status: Abnormal   Collection Time   05/13/11 10:00 PM      Component Value Range Comment   Glucose-Capillary 146 (*) 70 - 99 (mg/dL)    Comment 1 Documented in Chart     CBC     Status: Abnormal   Collection Time   05/13/11 10:05  PM      Component Value Range Comment   WBC 11.3 (*) 4.0 - 10.5 (K/uL)    RBC 5.06  4.22 - 5.81 (MIL/uL)    Hemoglobin 15.8  13.0 - 17.0 (g/dL)    HCT 16.1  09.6 - 04.5 (%)    MCV 90.1  78.0 - 100.0 (fL)    MCH 31.2  26.0 - 34.0 (pg)    MCHC 34.6  30.0 - 36.0 (g/dL)    RDW 40.9  81.1 - 91.4 (%)    Platelets 214  150 - 400 (K/uL)   COMPREHENSIVE METABOLIC PANEL     Status: Abnormal   Collection Time   05/13/11 10:05 PM      Component Value Range Comment   Sodium 138  135 - 145 (mEq/L)    Potassium 4.0  3.5 - 5.1 (mEq/L)    Chloride 102  96 - 112 (mEq/L)    CO2 25  19 - 32 (mEq/L)    Glucose, Bld 146 (*) 70 - 99 (mg/dL)    BUN 25 (*) 6 - 23 (mg/dL)    Creatinine, Ser 7.82  0.50 - 1.35 (mg/dL)    Calcium 9.5  8.4 - 10.5 (mg/dL)    Total Protein 7.3  6.0 - 8.3 (g/dL)    Albumin 4.2  3.5 - 5.2 (g/dL)    AST 25  0 - 37 (U/L)    ALT 17  0 - 53 (U/L)    Alkaline Phosphatase 67  39 - 117 (U/L)    Total Bilirubin 0.4  0.3 - 1.2 (mg/dL)    GFR calc non Af Amer 61 (*) >90 (mL/min)    GFR calc Af Amer 71 (*) >90 (mL/min)   LIPASE, BLOOD     Status: Normal   Collection Time   05/13/11 10:05 PM      Component Value Range Comment   Lipase 35  11 - 59 (U/L)   POCT  I-STAT TROPONIN I     Status: Normal   Collection Time   05/13/11 10:11 PM      Component Value Range Comment   Troponin i, poc 0.02  0.00 - 0.08 (ng/mL)    Comment 3            COMPREHENSIVE METABOLIC PANEL     Status: Abnormal   Collection Time   05/14/11  4:45 AM      Component Value Range Comment   Sodium 139  135 - 145 (mEq/L)    Potassium 4.7  3.5 - 5.1 (mEq/L)    Chloride 102  96 - 112 (mEq/L)    CO2 26  19 - 32 (mEq/L)    Glucose, Bld 158 (*) 70 - 99 (mg/dL)    BUN 22  6 - 23 (mg/dL)    Creatinine, Ser 9.56  0.50 - 1.35 (mg/dL)    Calcium 9.7  8.4 - 10.5 (mg/dL)    Total Protein 7.2  6.0 - 8.3 (g/dL)    Albumin 4.2  3.5 - 5.2 (g/dL)    AST 22  0 - 37 (U/L)    ALT 17  0 - 53 (U/L)    Alkaline Phosphatase 63  39 - 117 (U/L)    Total Bilirubin 0.5  0.3 - 1.2 (mg/dL)    GFR calc non Af Amer 63 (*) >90 (mL/min)    GFR calc Af Amer 73 (*) >90 (mL/min)   CBC     Status: Normal   Collection Time  05/14/11  4:45 AM      Component Value Range Comment   WBC 10.4  4.0 - 10.5 (K/uL)    RBC 5.08  4.22 - 5.81 (MIL/uL)    Hemoglobin 15.4  13.0 - 17.0 (g/dL)    HCT 16.1  09.6 - 04.5 (%)    MCV 90.2  78.0 - 100.0 (fL)    MCH 30.3  26.0 - 34.0 (pg)    MCHC 33.6  30.0 - 36.0 (g/dL)    RDW 40.9  81.1 - 91.4 (%)    Platelets 229  150 - 400 (K/uL)   MAGNESIUM     Status: Normal   Collection Time   05/14/11  4:45 AM      Component Value Range Comment   Magnesium 2.1  1.5 - 2.5 (mg/dL)   CARDIAC PANEL(CRET KIN+CKTOT+MB+TROPI)     Status: Normal   Collection Time   05/14/11  4:45 AM      Component Value Range Comment   Total CK 184  7 - 232 (U/L)    CK, MB 3.6  0.3 - 4.0 (ng/mL)    Troponin I <0.30  <0.30 (ng/mL)    Relative Index 2.0  0.0 - 2.5     Dg Chest 2 View  05/13/2011  *RADIOLOGY REPORT*  Clinical Data: Near-syncope; nausea and vomiting.  CHEST - 2 VIEW  Comparison: Chest radiograph performed 12/29/2005  Findings: The lungs are well-aerated.  There is stable right  basilar scarring and associated atelectasis, with underlying pleural thickening.  There is no evidence of focal opacification, pleural effusion or pneumothorax.  The heart is normal in size; the patient is status post median sternotomy, with evidence of prior CABG.  A mitral valve replacement is noted.  No acute osseous abnormalities are seen.  A small to moderate hiatal hernia is seen.  IMPRESSION:  1.  No acute cardiopulmonary process seen. 2.  Stable right basilar pleural thickening and scarring, with associated atelectasis. 3.  Small to moderate hiatal hernia again noted.  Original Report Authenticated By: Tonia Ghent, M.D.   Ct Head Wo Contrast  05/13/2011  *RADIOLOGY REPORT*  Clinical Data: Dizziness; history of TIA.  CT HEAD WITHOUT CONTRAST  Technique:  Contiguous axial images were obtained from the base of the skull through the vertex without contrast.  Comparison: CT of the head performed 12/29/2005, and MRI of the brain performed 10/30/2009  Findings: There is no evidence of acute infarction, mass lesion, or intra- or extra-axial hemorrhage on CT.  A focus of chronic encephalomalacia is noted at the high right parietal lobe, stable from the prior MRI.  An additional focus of encephalomalacia is noted at the high left frontoparietal region, also stable in appearance.  The posterior fossa, including the cerebellum, brainstem and fourth ventricle, is within normal limits.  The third and lateral ventricles, and basal ganglia are unremarkable in appearance.  No mass effect or midline shift is seen.  There is no evidence of fracture; visualized osseous structures are unremarkable in appearance.  The visualized portions of the orbits are within normal limits.  The paranasal sinuses and mastoid air cells are well-aerated.  No significant soft tissue abnormalities are seen.  IMPRESSION:  1.  No acute intracranial pathology seen on CT. 2.  Small foci of chronic encephalomalacia noted bilaterally, stable from  the prior MRI.  Original Report Authenticated By: Tonia Ghent, M.D.   Mr Angiogram Head Wo Contrast  05/14/2011  *RADIOLOGY REPORT*  Clinical Data:  Acute onset of  dizziness 12 hours ago.  History of mitral valve repair.  History of atrial fibrillation.  History hypertension.  History of chronic renal insufficiency.  MRI HEAD WITHOUT CONTRAST MRA HEAD WITHOUT CONTRAST  Technique:  Multiplanar, multiecho pulse sequences of the brain and surrounding structures were obtained without intravenous contrast. Angiographic images of the head were obtained using MRA technique without contrast.  Comparison:  CT head 05/13/2011.  MRI HEAD  Findings:  There are multiple foci of acute infarction affecting the posterior circulation on the left.   In the cerebellum, there is a large infarct in the left inferior and superior cerebellar hemisphere, with lesser involvement of the left superior vermis and left cerebellar tonsils.  There are small foci of subcentimeter infarction affecting the left occipital pole.  None of these show hemorrhage or midline shift.  Their distribution, with involvement of multiple vascular territories including the superior cerebellar artery, posterior cerebral artery, and posterior inferior cerebral artery, suggests a shower of emboli to the left vertebral artery.  No mass lesion, hydrocephalus, or extra-axial fluid is seen.  There is moderate age related atrophy.  There is extensive chronic microvascular ischemic change throughout the periventricular and subcortical white matter.  Remote cortical infarcts affect the left frontal and right parietal regions. Scattered remote lacunes are present.  Tiny foci of chronic hemorrhage suggest hypertensive related cerebrovascular disease.  The major intracranial vessels structures are patent.  There is no midline shift.  Pituitary and cerebellar tonsils are unremarkable.  There is no acute sinus or mastoid disease.  IMPRESSION: Multiple areas of acute  infarction affect the posterior circulation on the right, most notably the right cerebellar hemisphere. Question of left vertebral embolus is raised.  Atrophy and chronic microvascular ischemic change, with multiple foci of chronic infarction.  MRA HEAD  Findings: Widely patent internal carotid arteries.  Basilar artery widely patent with left greater than right vertebral contributions. There is no left or right vertebral artery stenosis.  There is no proximal stenosis of the anterior or posterior cerebral arteries. There is a 50% stenosis of the distal M1 segment of the left middle cerebral artery.  Right MCA is widely patent.  Distal MCA and PCA branches show intracranial atherosclerotic change with mild irregularity.  In the posterior circulation, the left superior cerebellar artery appears widely patent.  The left posterior inferior cerebellar artery is patent at its origin, but flow related enhancement distal to this is poorly visualized.  IMPRESSION: Intracranial atherosclerotic change as described.  Widely patent left vertebral artery with no basilar stenosis.  No clear-cut proximal left cerebellar branch occlusion.  50% stenosis left MCA, distal M1 segment.  Original Report Authenticated By: Elsie Stain, M.D.   Mr Brain Wo Contrast  05/14/2011  *RADIOLOGY REPORT*  Clinical Data:  Acute onset of dizziness 12 hours ago.  History of mitral valve repair.  History of atrial fibrillation.  History hypertension.  History of chronic renal insufficiency.  MRI HEAD WITHOUT CONTRAST MRA HEAD WITHOUT CONTRAST  Technique:  Multiplanar, multiecho pulse sequences of the brain and surrounding structures were obtained without intravenous contrast. Angiographic images of the head were obtained using MRA technique without contrast.  Comparison:  CT head 05/13/2011.  MRI HEAD  Findings:  There are multiple foci of acute infarction affecting the posterior circulation on the left.   In the cerebellum, there is a large  infarct in the left inferior and superior cerebellar hemisphere, with lesser involvement of the left superior vermis and left cerebellar tonsils.  There  are small foci of subcentimeter infarction affecting the left occipital pole.  None of these show hemorrhage or midline shift.  Their distribution, with involvement of multiple vascular territories including the superior cerebellar artery, posterior cerebral artery, and posterior inferior cerebral artery, suggests a shower of emboli to the left vertebral artery.  No mass lesion, hydrocephalus, or extra-axial fluid is seen.  There is moderate age related atrophy.  There is extensive chronic microvascular ischemic change throughout the periventricular and subcortical white matter.  Remote cortical infarcts affect the left frontal and right parietal regions. Scattered remote lacunes are present.  Tiny foci of chronic hemorrhage suggest hypertensive related cerebrovascular disease.  The major intracranial vessels structures are patent.  There is no midline shift.  Pituitary and cerebellar tonsils are unremarkable.  There is no acute sinus or mastoid disease.    IMPRESSION: Multiple areas of acute infarction affect the posterior circulation on the right, most notably the right cerebellar hemisphere. Question of left vertebral embolus is raised.  Atrophy and chronic microvascular ischemic change, with multiple foci of chronic infarction.    MRA HEAD  Findings: Widely patent internal carotid arteries.  Basilar artery widely patent with left greater than right vertebral contributions. There is no left or right vertebral artery stenosis.  There is no proximal stenosis of the anterior or posterior cerebral arteries. There is a 50% stenosis of the distal M1 segment of the left middle cerebral artery.  Right MCA is widely patent.  Distal MCA and PCA branches show intracranial atherosclerotic change with mild irregularity.  In the posterior circulation, the left superior  cerebellar artery appears widely patent.  The left posterior inferior cerebellar artery is patent at its origin, but flow related enhancement distal to this is poorly visualized.    IMPRESSION: Intracranial atherosclerotic change as described.  Widely patent left vertebral artery with no basilar stenosis.  No clear-cut proximal left cerebellar branch occlusion.  50% stenosis left MCA, distal M1 segment.  Original Report Authenticated By: Elsie Stain, M.D.    Assessment: 77 y.o. male  has a past medical history of Hyperlipidemia; Hypertension; Coronary artery disease; MVP (mitral valve prolapse); Atrial fibrillation; Chronic renal insufficiency; History of recurrent TIAs; Hypertensive vascular disease; and CVA (cerebrovascular accident). Now presenting with new acute multiple areas of acute infarction affect the posterior circulation on the right, most notably the right cerebellar hemisphere. Lipids well controlled.  On Plavix and ASA at home.  Deficits at this point are minimal.   Stroke Risk Factors - atrial fibrillation, hyperlipidemia and hypertension  Plan:  1. Prophylactic therapy-Antiplatelet med: Plavix - dose 75 mg and ASA 2. Continue with 2D echo with low threshold for TEE if needed 3. Risk factor modification 4. CTA of the neck. MRA of the brain already performed.   Felicie Morn PA-C [facility] [phone]  05/14/2011, 12:19 PM   Patient seen and examined. I agree with the above.  Thana Farr, MD [facility] [phone]  05/14/2011  4:40 PM

## 2011-05-14 NOTE — Progress Notes (Signed)
Subjective: Reports that his dizziness is improved.  No other specific complaints.  Objective: Vital signs in last 24 hours: Filed Vitals:   05/13/11 2218 05/14/11 0003 05/14/11 0120 05/14/11 0356  BP: 164/98 140/79 171/92 146/81  Pulse: 69 70 71 67  Temp:   98.3 F (36.8 C) 97.8 F (36.6 C)  TempSrc:   Oral Oral  Resp:  16 18 18   Weight:   83.734 kg (184 lb 9.6 oz)   SpO2:  98% 94% 99%   Weight change:   Intake/Output Summary (Last 24 hours) at 05/14/11 1255 Last data filed at 05/14/11 0300  Gross per 24 hour  Intake      0 ml  Output    400 ml  Net   -400 ml   Physical Exam: General: Awake, Oriented, No acute distress. HEENT: EOMI. Neck: Supple CV: S1 and S2 Lungs: Clear to ascultation bilaterally Abdomen: Soft, Nontender, Nondistended, +bowel sounds. Ext: Good pulses. Trace edema.  Lab Results:  [facility] 05/14/11 0445 05/13/11 2205  NA 139 138  K 4.7 4.0  CL 102 102  CO2 26 25  GLUCOSE 158* 146*  BUN 22 25*  CREATININE 1.08 1.10  CALCIUM 9.7 9.5  MG 2.1 --  PHOS -- --    Basename 05/14/11 0445 05/13/11 2205  AST 22 25  ALT 17 17  ALKPHOS 63 67  BILITOT 0.5 0.4  PROT 7.2 7.3  ALBUMIN 4.2 4.2    Basename 05/13/11 2205  LIPASE 35  AMYLASE --    Basename 05/14/11 0445 05/13/11 2205  WBC 10.4 11.3*  NEUTROABS -- --  HGB 15.4 15.8  HCT 45.8 45.6  MCV 90.2 90.1  PLT 229 214    Basename 05/14/11 0445  CKTOTAL 184  CKMB 3.6  CKMBINDEX --  TROPONINI <0.30   No results found for this basename: POCBNP:3 in the last 72 hours No results found for this basename: DDIMER:2 in the last 72 hours No results found for this basename: HGBA1C:2 in the last 72 hours No results found for this basename: CHOL:2,HDL:2,LDLCALC:2,TRIG:2,CHOLHDL:2,LDLDIRECT:2 in the last 72 hours  Basename 05/14/11 0445  TSH 0.293*  T4TOTAL --  T3FREE --  THYROIDAB --   No results found for this basename: VITAMINB12:2,FOLATE:2,FERRITIN:2,TIBC:2,IRON:2,RETICCTPCT:2 in the  last 72 hours  Micro Results: No results found for this or any previous visit (from the past 240 hour(s)).  Studies/Results: Dg Chest 2 View  05/13/2011  *RADIOLOGY REPORT*  Clinical Data: Near-syncope; nausea and vomiting.  CHEST - 2 VIEW  Comparison: Chest radiograph performed 12/29/2005  Findings: The lungs are well-aerated.  There is stable right basilar scarring and associated atelectasis, with underlying pleural thickening.  There is no evidence of focal opacification, pleural effusion or pneumothorax.  The heart is normal in size; the patient is status post median sternotomy, with evidence of prior CABG.  A mitral valve replacement is noted.  No acute osseous abnormalities are seen.  A small to moderate hiatal hernia is seen.  IMPRESSION:  1.  No acute cardiopulmonary process seen. 2.  Stable right basilar pleural thickening and scarring, with associated atelectasis. 3.  Small to moderate hiatal hernia again noted.  Original Report Authenticated By: Tonia Ghent, M.D.   Ct Head Wo Contrast  05/13/2011  *RADIOLOGY REPORT*  Clinical Data: Dizziness; history of TIA.  CT HEAD WITHOUT CONTRAST  Technique:  Contiguous axial images were obtained from the base of the skull through the vertex without contrast.  Comparison: CT of the head performed 12/29/2005, and  MRI of the brain performed 10/30/2009  Findings: There is no evidence of acute infarction, mass lesion, or intra- or extra-axial hemorrhage on CT.  A focus of chronic encephalomalacia is noted at the high right parietal lobe, stable from the prior MRI.  An additional focus of encephalomalacia is noted at the high left frontoparietal region, also stable in appearance.  The posterior fossa, including the cerebellum, brainstem and fourth ventricle, is within normal limits.  The third and lateral ventricles, and basal ganglia are unremarkable in appearance.  No mass effect or midline shift is seen.  There is no evidence of fracture; visualized  osseous structures are unremarkable in appearance.  The visualized portions of the orbits are within normal limits.  The paranasal sinuses and mastoid air cells are well-aerated.  No significant soft tissue abnormalities are seen.  IMPRESSION:  1.  No acute intracranial pathology seen on CT. 2.  Small foci of chronic encephalomalacia noted bilaterally, stable from the prior MRI.  Original Report Authenticated By: Tonia Ghent, M.D.   Mr Angiogram Head Wo Contrast  05/14/2011  *RADIOLOGY REPORT*  Clinical Data:  Acute onset of dizziness 12 hours ago.  History of mitral valve repair.  History of atrial fibrillation.  History hypertension.  History of chronic renal insufficiency.  MRI HEAD WITHOUT CONTRAST MRA HEAD WITHOUT CONTRAST  Technique:  Multiplanar, multiecho pulse sequences of the brain and surrounding structures were obtained without intravenous contrast. Angiographic images of the head were obtained using MRA technique without contrast.  Comparison:  CT head 05/13/2011.  MRI HEAD  Findings:  There are multiple foci of acute infarction affecting the posterior circulation on the left.   In the cerebellum, there is a large infarct in the left inferior and superior cerebellar hemisphere, with lesser involvement of the left superior vermis and left cerebellar tonsils.  There are small foci of subcentimeter infarction affecting the left occipital pole.  None of these show hemorrhage or midline shift.  Their distribution, with involvement of multiple vascular territories including the superior cerebellar artery, posterior cerebral artery, and posterior inferior cerebral artery, suggests a shower of emboli to the left vertebral artery.  No mass lesion, hydrocephalus, or extra-axial fluid is seen.  There is moderate age related atrophy.  There is extensive chronic microvascular ischemic change throughout the periventricular and subcortical white matter.  Remote cortical infarcts affect the left frontal and right  parietal regions. Scattered remote lacunes are present.  Tiny foci of chronic hemorrhage suggest hypertensive related cerebrovascular disease.  The major intracranial vessels structures are patent.  There is no midline shift.  Pituitary and cerebellar tonsils are unremarkable.  There is no acute sinus or mastoid disease.  IMPRESSION: Multiple areas of acute infarction affect the posterior circulation on the right, most notably the right cerebellar hemisphere. Question of left vertebral embolus is raised.  Atrophy and chronic microvascular ischemic change, with multiple foci of chronic infarction.  MRA HEAD  Findings: Widely patent internal carotid arteries.  Basilar artery widely patent with left greater than right vertebral contributions. There is no left or right vertebral artery stenosis.  There is no proximal stenosis of the anterior or posterior cerebral arteries. There is a 50% stenosis of the distal M1 segment of the left middle cerebral artery.  Right MCA is widely patent.  Distal MCA and PCA branches show intracranial atherosclerotic change with mild irregularity.  In the posterior circulation, the left superior cerebellar artery appears widely patent.  The left posterior inferior cerebellar artery is patent  at its origin, but flow related enhancement distal to this is poorly visualized.  IMPRESSION: Intracranial atherosclerotic change as described.  Widely patent left vertebral artery with no basilar stenosis.  No clear-cut proximal left cerebellar branch occlusion.  50% stenosis left MCA, distal M1 segment.  Original Report Authenticated By: Elsie Stain, M.D.   Mr Brain Wo Contrast  05/14/2011  *RADIOLOGY REPORT*  Clinical Data:  Acute onset of dizziness 12 hours ago.  History of mitral valve repair.  History of atrial fibrillation.  History hypertension.  History of chronic renal insufficiency.  MRI HEAD WITHOUT CONTRAST MRA HEAD WITHOUT CONTRAST  Technique:  Multiplanar, multiecho pulse sequences  of the brain and surrounding structures were obtained without intravenous contrast. Angiographic images of the head were obtained using MRA technique without contrast.  Comparison:  CT head 05/13/2011.  MRI HEAD  Findings:  There are multiple foci of acute infarction affecting the posterior circulation on the left.   In the cerebellum, there is a large infarct in the left inferior and superior cerebellar hemisphere, with lesser involvement of the left superior vermis and left cerebellar tonsils.  There are small foci of subcentimeter infarction affecting the left occipital pole.  None of these show hemorrhage or midline shift.  Their distribution, with involvement of multiple vascular territories including the superior cerebellar artery, posterior cerebral artery, and posterior inferior cerebral artery, suggests a shower of emboli to the left vertebral artery.  No mass lesion, hydrocephalus, or extra-axial fluid is seen.  There is moderate age related atrophy.  There is extensive chronic microvascular ischemic change throughout the periventricular and subcortical white matter.  Remote cortical infarcts affect the left frontal and right parietal regions. Scattered remote lacunes are present.  Tiny foci of chronic hemorrhage suggest hypertensive related cerebrovascular disease.  The major intracranial vessels structures are patent.  There is no midline shift.  Pituitary and cerebellar tonsils are unremarkable.  There is no acute sinus or mastoid disease.  IMPRESSION: Multiple areas of acute infarction affect the posterior circulation on the right, most notably the right cerebellar hemisphere. Question of left vertebral embolus is raised.  Atrophy and chronic microvascular ischemic change, with multiple foci of chronic infarction.  MRA HEAD  Findings: Widely patent internal carotid arteries.  Basilar artery widely patent with left greater than right vertebral contributions. There is no left or right vertebral artery  stenosis.  There is no proximal stenosis of the anterior or posterior cerebral arteries. There is a 50% stenosis of the distal M1 segment of the left middle cerebral artery.  Right MCA is widely patent.  Distal MCA and PCA branches show intracranial atherosclerotic change with mild irregularity.  In the posterior circulation, the left superior cerebellar artery appears widely patent.  The left posterior inferior cerebellar artery is patent at its origin, but flow related enhancement distal to this is poorly visualized.  IMPRESSION: Intracranial atherosclerotic change as described.  Widely patent left vertebral artery with no basilar stenosis.  No clear-cut proximal left cerebellar branch occlusion.  50% stenosis left MCA, distal M1 segment.  Original Report Authenticated By: Elsie Stain, M.D.    Medications: I have reviewed the patient's current medications. Scheduled Meds:   . amLODipine  5 mg Oral Daily  . aspirin  81 mg Oral Daily  . benazepril  40 mg Oral Daily  . clopidogrel  75 mg Oral Q breakfast  . famotidine  20 mg Oral Daily  . metoprolol tartrate  25 mg Oral BID  .  rosuvastatin  20 mg Oral Daily  . DISCONTD: aspirin  81 mg Oral Daily   Continuous Infusions:   . sodium chloride 1,000 mL (05/14/11 0437)   PRN Meds:.acetaminophen, acetaminophen, cyclobenzaprine, ondansetron (ZOFRAN) IV, ondansetron  Assessment/Plan: Principal Problem:  *Dizziness Active Problems:  Hypertension  Coronary artery disease  Atrial fibrillation  CVA (cerebral infarction)  1.  Acute CVA affecting the posterior circulation on the right, most notably the right cerebellar hemisphere.  Likely causing the dizziness.  Initiate stroke workup.  2-D echocardiogram has been ordered.  Will have PT and OT consulted.  Neurology has been consult at.  Continue Plavix for now.  Will get a bedside swallow evaluation done.  MRA done of the head with results as indicated above.  Will get a fasting lipid profile in the  morning.  2.  History of A. fib status post Maze procedure currently in sinus.  3.  History of mitral valve repair.  4.  Hypertension.  Allow for permissive hypertension and keep his systolic blood pressure less than 160.  5.  Low TSH.  Will send for a free T4 in the morning.  6.  Prophylaxis.  Lovenox for DVT prophylaxis.  7.  Disposition.  Pending stroke workup and pending PT and OT evaluation.   LOS: 1 day  Kielan Dreisbach A, MD 05/14/2011, 12:55 PM

## 2011-05-14 NOTE — H&P (Signed)
Arthur [name] is an 77 y.o. male.   Chief Complaint: Dizziness. HPI: 77 year-old male with history of coronary artery disease status post CABG, history of a fibrillation status post maze procedure, history of mitral valve repair previous history of TIA and CVA present with complaints of sudden onset of dizziness last night and he was watching television at around 9 PM. He had a pizza and after a few minutes he started feeling uncomfortable and felt dizzy. Then he felt unable to stand up because of his dizziness. And eventually he had nausea and vomited once. He was brought here and had a CAT scan head which was negative. Orthostatics checked in the ER was negative. He states his dizziness is improved largely but still just feels dizzy when he moves his head sideways. Denies any blurred vision, headache, focal deficit, palpitations, chest pain, shortness of breath.  Past Medical History  Diagnosis Date  . Hyperlipidemia   . Hypertension   . Coronary artery disease   . MVP (mitral valve prolapse)   . Atrial fibrillation   . Chronic renal insufficiency   . History of recurrent TIAs   . Hypertensive vascular disease   . CVA (cerebrovascular accident)     RIGHT BRAIN    Past Surgical History  Procedure Date  . Cardiac catheterization 03/19/2004  . Maze 2005  . Coronary artery bypass graft 2005    LIMA GRAFT TO THE DISTAL LAD, SAPHENOUS VEIN GRAFT TO THE FIRST DIADGONAL BRANCH, SAPHENOUS VEIN GRAFT TO THE THIRD MARIGINAL BRANCH, AND SAPHENOUS VEIN GRAFT TO THE PDA  . Mitral valve repair     Family History  Problem Relation Age of Onset  . Heart attack Mother   . Stroke Father    Social History:  reports that he quit smoking about 55 years ago. He does not have any smokeless tobacco history on file. He reports that he does not drink alcohol or use illicit drugs.  Allergies:  Allergies  Allergen Reactions  . Lasix (Furosemide) Rash  . Penicillins Rash  . Sulfa Drugs Cross Reactors  Rash    No current facility-administered medications on file as of 05/13/2011.   Medications Prior to Admission  Medication Sig Dispense Refill  . amLODipine (NORVASC) 5 MG tablet Take 5 mg by mouth daily.        Marland Kitchen aspirin 81 MG tablet Take 81 mg by mouth daily.        . benazepril (LOTENSIN) 40 MG tablet Take 1 tablet (40 mg total) by mouth daily.  90 tablet  3  . clopidogrel (PLAVIX) 75 MG tablet Take 75 mg by mouth daily.        . metoprolol tartrate (LOPRESSOR) 25 MG tablet Take 25 mg by mouth 2 (two) times daily.        . ranitidine (ZANTAC) 150 MG tablet Take 1 tablet (150 mg total) by mouth 2 (two) times daily.  60 tablet  5  . simvastatin (ZOCOR) 40 MG tablet Take one tablet by mouth daily.  90 tablet  3    Results for orders placed during the hospital encounter of 05/13/11 (from the past 48 hour(s))  URINALYSIS, ROUTINE W REFLEX MICROSCOPIC     Status: Normal   Collection Time   05/13/11 10:00 PM      Component Value Range Comment   Color, Urine YELLOW  YELLOW     APPearance CLEAR  CLEAR     Specific Gravity, Urine 1.009  1.005 - 1.030  pH 7.5  5.0 - 8.0     Glucose, UA NEGATIVE  NEGATIVE (mg/dL)    Hgb urine dipstick NEGATIVE  NEGATIVE     Bilirubin Urine NEGATIVE  NEGATIVE     Ketones, ur NEGATIVE  NEGATIVE (mg/dL)    Protein, ur NEGATIVE  NEGATIVE (mg/dL)    Urobilinogen, UA 1.0  0.0 - 1.0 (mg/dL)    Nitrite NEGATIVE  NEGATIVE     Leukocytes, UA NEGATIVE  NEGATIVE  MICROSCOPIC NOT DONE ON URINES WITH NEGATIVE PROTEIN, BLOOD, LEUKOCYTES, NITRITE, OR GLUCOSE <1000 mg/dL.  GLUCOSE, CAPILLARY     Status: Abnormal   Collection Time   05/13/11 10:00 PM      Component Value Range Comment   Glucose-Capillary 146 (*) 70 - 99 (mg/dL)    Comment 1 Documented in Chart     CBC     Status: Abnormal   Collection Time   05/13/11 10:05 PM      Component Value Range Comment   WBC 11.3 (*) 4.0 - 10.5 (K/uL)    RBC 5.06  4.22 - 5.81 (MIL/uL)    Hemoglobin 15.8  13.0 - 17.0  (g/dL)    HCT 16.1  09.6 - 04.5 (%)    MCV 90.1  78.0 - 100.0 (fL)    MCH 31.2  26.0 - 34.0 (pg)    MCHC 34.6  30.0 - 36.0 (g/dL)    RDW 40.9  81.1 - 91.4 (%)    Platelets 214  150 - 400 (K/uL)   COMPREHENSIVE METABOLIC PANEL     Status: Abnormal   Collection Time   05/13/11 10:05 PM      Component Value Range Comment   Sodium 138  135 - 145 (mEq/L)    Potassium 4.0  3.5 - 5.1 (mEq/L)    Chloride 102  96 - 112 (mEq/L)    CO2 25  19 - 32 (mEq/L)    Glucose, Bld 146 (*) 70 - 99 (mg/dL)    BUN 25 (*) 6 - 23 (mg/dL)    Creatinine, Ser 7.82  0.50 - 1.35 (mg/dL)    Calcium 9.5  8.4 - 10.5 (mg/dL)    Total Protein 7.3  6.0 - 8.3 (g/dL)    Albumin 4.2  3.5 - 5.2 (g/dL)    AST 25  0 - 37 (U/L)    ALT 17  0 - 53 (U/L)    Alkaline Phosphatase 67  39 - 117 (U/L)    Total Bilirubin 0.4  0.3 - 1.2 (mg/dL)    GFR calc non Af Amer 61 (*) >90 (mL/min)    GFR calc Af Amer 71 (*) >90 (mL/min)   LIPASE, BLOOD     Status: Normal   Collection Time   05/13/11 10:05 PM      Component Value Range Comment   Lipase 35  11 - 59 (U/L)   POCT I-STAT TROPONIN I     Status: Normal   Collection Time   05/13/11 10:11 PM      Component Value Range Comment   Troponin i, poc 0.02  0.00 - 0.08 (ng/mL)    Comment 3             Dg Chest 2 View  05/13/2011  *RADIOLOGY REPORT*  Clinical Data: Near-syncope; nausea and vomiting.  CHEST - 2 VIEW  Comparison: Chest radiograph performed 12/29/2005  Findings: The lungs are well-aerated.  There is stable right basilar scarring and associated atelectasis, with underlying pleural thickening.  There is  no evidence of focal opacification, pleural effusion or pneumothorax.  The heart is normal in size; the patient is status post median sternotomy, with evidence of prior CABG.  A mitral valve replacement is noted.  No acute osseous abnormalities are seen.  A small to moderate hiatal hernia is seen.  IMPRESSION:  1.  No acute cardiopulmonary process seen. 2.  Stable right basilar  pleural thickening and scarring, with associated atelectasis. 3.  Small to moderate hiatal hernia again noted.  Original Report Authenticated By: Tonia Ghent, M.D.   Ct Head Wo Contrast  05/13/2011  *RADIOLOGY REPORT*  Clinical Data: Dizziness; history of TIA.  CT HEAD WITHOUT CONTRAST  Technique:  Contiguous axial images were obtained from the base of the skull through the vertex without contrast.  Comparison: CT of the head performed 12/29/2005, and MRI of the brain performed 10/30/2009  Findings: There is no evidence of acute infarction, mass lesion, or intra- or extra-axial hemorrhage on CT.  A focus of chronic encephalomalacia is noted at the high right parietal lobe, stable from the prior MRI.  An additional focus of encephalomalacia is noted at the high left frontoparietal region, also stable in appearance.  The posterior fossa, including the cerebellum, brainstem and fourth ventricle, is within normal limits.  The third and lateral ventricles, and basal ganglia are unremarkable in appearance.  No mass effect or midline shift is seen.  There is no evidence of fracture; visualized osseous structures are unremarkable in appearance.  The visualized portions of the orbits are within normal limits.  The paranasal sinuses and mastoid air cells are well-aerated.  No significant soft tissue abnormalities are seen.  IMPRESSION:  1.  No acute intracranial pathology seen on CT. 2.  Small foci of chronic encephalomalacia noted bilaterally, stable from the prior MRI.  Original Report Authenticated By: Tonia Ghent, M.D.    Review of Systems  Constitutional: Negative.   HENT: Negative.   Eyes: Negative.   Respiratory: Negative.   Cardiovascular: Negative.   Gastrointestinal: Positive for nausea and vomiting.  Genitourinary: Negative.   Musculoskeletal: Negative.   Skin: Negative.   Neurological: Positive for dizziness.  Endo/Heme/Allergies: Negative.   Psychiatric/Behavioral: Negative.     Blood  pressure 140/79, pulse 70, temperature 97.5 F (36.4 C), temperature source Oral, resp. rate 16, SpO2 98.00%. Physical Exam  Constitutional: He is oriented to person, place, and time. He appears well-developed and well-nourished.  HENT:  Head: Normocephalic and atraumatic.  Right Ear: External ear normal.  Left Ear: External ear normal.  Nose: Nose normal.  Mouth/Throat: Oropharynx is clear and moist.  Eyes: Conjunctivae and EOM are normal. Pupils are equal, round, and reactive to light. Right eye exhibits no discharge. Left eye exhibits no discharge. No scleral icterus.  Neck: Normal range of motion. Neck supple.  Cardiovascular: Normal rate, regular rhythm, normal heart sounds and intact distal pulses.   Respiratory: Effort normal and breath sounds normal. No respiratory distress. He has no wheezes.  GI: Soft. Bowel sounds are normal. He exhibits no distension. There is no tenderness. There is no rebound.  Musculoskeletal: Normal range of motion.  Neurological: He is alert and oriented to person, place, and time. He has normal reflexes. No cranial nerve deficit. Coordination normal.       Moves upper and lower limbs 5/5.     Assessment/Plan #1. Dizziness. #2. History of CAD status post CABG. #3. History of atrial fibrillation status post Maze procedure. #4. History of mitral valve repair. #5. History of  TIA and CVA.  Plan We'll admit to telemetry observation and rule out any arrhythmias. As patient states his dizziness is mostly when he moves his head that could be benign positional vertigo. Given the history of TIA and CVA, I am ordering an MRI of the brain. At this time and gently hydrating patient. Further recommendations based on clinical course and the test ordered particularly MRI of the brain.  Macon Lesesne N. 05/14/2011, 12:46 AM

## 2011-05-15 ENCOUNTER — Other Ambulatory Visit: Payer: Self-pay

## 2011-05-15 DIAGNOSIS — I517 Cardiomegaly: Secondary | ICD-10-CM

## 2011-05-15 LAB — LIPID PANEL
Cholesterol: 147 mg/dL (ref 0–200)
HDL: 46 mg/dL (ref >39)

## 2011-05-15 LAB — T4, FREE: Free T4: 1.17 ng/dL (ref 0.80–1.80)

## 2011-05-15 NOTE — Progress Notes (Signed)
Subjective: Reports that his dizziness is improved.  No other specific complaints.  Objective: Vital signs in last 24 hours: Filed Vitals:   05/14/11 1431 05/14/11 1900 05/14/11 2212 05/15/11 0635  BP: 137/75 126/72 126/72 127/72  Pulse: 71 80 80 69  Temp: 98.2 F (36.8 C) 98.6 F (37 C)  98.1 F (36.7 C)  TempSrc: Oral Oral    Resp: 19 18  20   Weight:      SpO2: 98% 98%  98%   Weight change:   Intake/Output Summary (Last 24 hours) at 05/15/11 1355 Last data filed at 05/15/11 1100  Gross per 24 hour  Intake    710 ml  Output   1225 ml  Net   -515 ml   Physical Exam: General: Awake, Oriented, No acute distress. HEENT: EOMI. Neck: Supple CV: S1 and S2 Lungs: Clear to ascultation bilaterally Abdomen: Soft, Nontender, Nondistended, +bowel sounds. Ext: Good pulses. Trace edema.  Lab Results:  [facility] 05/14/11 0445 05/13/11 2205  NA 139 138  K 4.7 4.0  CL 102 102  CO2 26 25  GLUCOSE 158* 146*  BUN 22 25*  CREATININE 1.08 1.10  CALCIUM 9.7 9.5  MG 2.1 --  PHOS -- --    Basename 05/14/11 0445 05/13/11 2205  AST 22 25  ALT 17 17  ALKPHOS 63 67  BILITOT 0.5 0.4  PROT 7.2 7.3  ALBUMIN 4.2 4.2    Basename 05/13/11 2205  LIPASE 35  AMYLASE --    Basename 05/14/11 0445 05/13/11 2205  WBC 10.4 11.3*  NEUTROABS -- --  HGB 15.4 15.8  HCT 45.8 45.6  MCV 90.2 90.1  PLT 229 214    Basename 05/14/11 2052 05/14/11 1245 05/14/11 0445  CKTOTAL 188 218 184  CKMB 2.8 3.5 3.6  CKMBINDEX -- -- --  TROPONINI <0.30 <0.30 <0.30   No results found for this basename: POCBNP:3 in the last 72 hours No results found for this basename: DDIMER:2 in the last 72 hours  Basename 05/14/11 1340  HGBA1C 5.6    Basename 05/15/11 0700  CHOL 147  HDL 46  LDLCALC 81  TRIG 99  CHOLHDL 3.2  LDLDIRECT --    Basename 05/14/11 0445  TSH 0.293*  T4TOTAL --  T3FREE --  THYROIDAB --   No results found for this basename:  VITAMINB12:2,FOLATE:2,FERRITIN:2,TIBC:2,IRON:2,RETICCTPCT:2 in the last 72 hours  Micro Results: No results found for this or any previous visit (from the past 240 hour(s)).  Studies/Results: Dg Chest 2 View  05/13/2011  *RADIOLOGY REPORT*  Clinical Data: Near-syncope; nausea and vomiting.  CHEST - 2 VIEW  Comparison: Chest radiograph performed 12/29/2005  Findings: The lungs are well-aerated.  There is stable right basilar scarring and associated atelectasis, with underlying pleural thickening.  There is no evidence of focal opacification, pleural effusion or pneumothorax.  The heart is normal in size; the patient is status post median sternotomy, with evidence of prior CABG.  A mitral valve replacement is noted.  No acute osseous abnormalities are seen.  A small to moderate hiatal hernia is seen.  IMPRESSION:  1.  No acute cardiopulmonary process seen. 2.  Stable right basilar pleural thickening and scarring, with associated atelectasis. 3.  Small to moderate hiatal hernia again noted.  Original Report Authenticated By: Tonia Ghent, M.D.   Ct Head Wo Contrast  05/13/2011  *RADIOLOGY REPORT*  Clinical Data: Dizziness; history of TIA.  CT HEAD WITHOUT CONTRAST  Technique:  Contiguous axial images were obtained from the base of  the skull through the vertex without contrast.  Comparison: CT of the head performed 12/29/2005, and MRI of the brain performed 10/30/2009  Findings: There is no evidence of acute infarction, mass lesion, or intra- or extra-axial hemorrhage on CT.  A focus of chronic encephalomalacia is noted at the high right parietal lobe, stable from the prior MRI.  An additional focus of encephalomalacia is noted at the high left frontoparietal region, also stable in appearance.  The posterior fossa, including the cerebellum, brainstem and fourth ventricle, is within normal limits.  The third and lateral ventricles, and basal ganglia are unremarkable in appearance.  No mass effect or midline  shift is seen.  There is no evidence of fracture; visualized osseous structures are unremarkable in appearance.  The visualized portions of the orbits are within normal limits.  The paranasal sinuses and mastoid air cells are well-aerated.  No significant soft tissue abnormalities are seen.  IMPRESSION:  1.  No acute intracranial pathology seen on CT. 2.  Small foci of chronic encephalomalacia noted bilaterally, stable from the prior MRI.  Original Report Authenticated By: Tonia Ghent, M.D.   Ct Angio Neck W/cm &/or Wo/cm  05/15/2011  *RADIOLOGY REPORT*  Clinical Data:  Left cerebellar stroke.  Dizziness.  Nonstenotic calcific plaque both carotid bifurcations.  CT ANGIOGRAPHY NECK  Technique:  Multidetector CT imaging of the neck was performed using the standard protocol during bolus administration of intravenous contrast.  Multiplanar CT image reconstructions including MIPs were obtained to evaluate the vascular anatomy. Carotid stenosis measurements (when applicable) are obtained utilizing NASCET criteria, using the distal internal carotid diameter as the denominator.  Contrast: 75mL OMNIPAQUE IOHEXOL 350 MG/ML IV SOLN  Comparison:  MRI brain earlier in the day.  Findings:  There is conventional branching of the great vessels from the arch.  There is moderate proximal irregularity consisting of heavily calcified plaque within the left subclavian adjacent to the origin the left vertebral which could serve as a source of embolus.  Formal catheter angiogram could provide additional information.  Significant calcific disease is noted in the right subclavian adjacent to the right vertebral origin.  Neither appear to result in a flow limiting ostial stenosis however. Nonstenotic plaque is seen at the origin of the innominate, and left common carotid artery.  Moderately heavy calcific plaque can be seen in the midportion of the left common carotid artery.  This is non flow reducing.  At the right carotid bifurcation  there is nonstenotic calcific plaque along the posterior wall extending into the proximal right internal carotid artery.  Moderate dolichoectasia is seen without flow limiting lesions.  Moderately heavy calcific plaque can be seen at the origin of the left internal carotid artery.  There is a less than 50% stenosis at the origin of the left internal carotid artery (2.3/5.0 ratio), and no cervical internal carotid artery disease is present.  Both vertebrals are patent through the neck without significant stenosis.  Moderate cervical spondylosis is present, most severe at C5-6 and C6-7.  The airway is midline.  No lesion is seen at the lung apex. There has been previous median sternotomy.  There are no visible neck masses.  Limited views through the intracranial compartment demonstrate cytotoxic edema  in the left cerebellar hemisphere redemonstrating the acute infarct displayed on MRI.   Review of the MIP images confirms the above findings.  IMPRESSION: CALCIFIC PLAQUE IN BOTH SUBCLAVIAN ARTERIES, LEFT GREATER THAN RIGHT, LIE ADJACENT TO THE VERTEBRAL ORIGINS.  IT IS POSSIBLE THAT  THE LEFT SUBCLAVIAN DISEASE COULD CONTRIBUTE TO THE OBSERVED PATTERN OF LEFT CEREBELLAR INFARCTION.  FORMAL CATHETER ANGIOGRAM COULD BE HELPFUL IN FURTHER EVALUATION.  Original Report Authenticated By: Elsie Stain, M.D.   Mr Angiogram Head Wo Contrast  05/15/2011  **ADDENDUM** CREATED: 05/15/2011 11:42:09  In the first sentence of the Impression, the sentence should read "Multiple areas of acute infarction affect the posterior circulation on the left, most notably the left cerebellar hemisphere."  **END ADDENDUM** SIGNED BY: Elsie Stain, M.D.   05/15/2011  *RADIOLOGY REPORT*  Clinical Data:  Acute onset of dizziness 12 hours ago.  History of mitral valve repair.  History of atrial fibrillation.  History hypertension.  History of chronic renal insufficiency.  MRI HEAD WITHOUT CONTRAST MRA HEAD WITHOUT CONTRAST  Technique:   Multiplanar, multiecho pulse sequences of the brain and surrounding structures were obtained without intravenous contrast. Angiographic images of the head were obtained using MRA technique without contrast.  Comparison:  CT head 05/13/2011.  MRI HEAD  Findings:  There are multiple foci of acute infarction affecting the posterior circulation on the left.   In the cerebellum, there is a large infarct in the left inferior and superior cerebellar hemisphere, with lesser involvement of the left superior vermis and left cerebellar tonsils.  There are small foci of subcentimeter infarction affecting the left occipital pole.  None of these show hemorrhage or midline shift.  Their distribution, with involvement of multiple vascular territories including the superior cerebellar artery, posterior cerebral artery, and posterior inferior cerebral artery, suggests a shower of emboli to the left vertebral artery.  No mass lesion, hydrocephalus, or extra-axial fluid is seen.  There is moderate age related atrophy.  There is extensive chronic microvascular ischemic change throughout the periventricular and subcortical white matter.  Remote cortical infarcts affect the left frontal and right parietal regions. Scattered remote lacunes are present.  Tiny foci of chronic hemorrhage suggest hypertensive related cerebrovascular disease.  The major intracranial vessels structures are patent.  There is no midline shift.  Pituitary and cerebellar tonsils are unremarkable.  There is no acute sinus or mastoid disease.  IMPRESSION: Multiple areas of acute infarction affect the posterior circulation on the right, most notably the right cerebellar hemisphere. Question of left vertebral embolus is raised.  Atrophy and chronic microvascular ischemic change, with multiple foci of chronic infarction.  MRA HEAD  Findings: Widely patent internal carotid arteries.  Basilar artery widely patent with left greater than right vertebral contributions. There  is no left or right vertebral artery stenosis.  There is no proximal stenosis of the anterior or posterior cerebral arteries. There is a 50% stenosis of the distal M1 segment of the left middle cerebral artery.  Right MCA is widely patent.  Distal MCA and PCA branches show intracranial atherosclerotic change with mild irregularity.  In the posterior circulation, the left superior cerebellar artery appears widely patent.  The left posterior inferior cerebellar artery is patent at its origin, but flow related enhancement distal to this is poorly visualized.  IMPRESSION: Intracranial atherosclerotic change as described.  Widely patent left vertebral artery with no basilar stenosis.  No clear-cut proximal left cerebellar branch occlusion.  50% stenosis left MCA, distal M1 segment. Original Report Authenticated By: Elsie Stain, M.D.   Mr Brain Wo Contrast  05/15/2011  **ADDENDUM** CREATED: 05/15/2011 11:42:09  In the first sentence of the Impression, the sentence should read "Multiple areas of acute infarction affect the posterior circulation on the left, most notably the left  cerebellar hemisphere."  **END ADDENDUM** SIGNED BY: Elsie Stain, M.D.   05/15/2011  *RADIOLOGY REPORT*  Clinical Data:  Acute onset of dizziness 12 hours ago.  History of mitral valve repair.  History of atrial fibrillation.  History hypertension.  History of chronic renal insufficiency.  MRI HEAD WITHOUT CONTRAST MRA HEAD WITHOUT CONTRAST  Technique:  Multiplanar, multiecho pulse sequences of the brain and surrounding structures were obtained without intravenous contrast. Angiographic images of the head were obtained using MRA technique without contrast.  Comparison:  CT head 05/13/2011.  MRI HEAD  Findings:  There are multiple foci of acute infarction affecting the posterior circulation on the left.   In the cerebellum, there is a large infarct in the left inferior and superior cerebellar hemisphere, with lesser involvement of the left  superior vermis and left cerebellar tonsils.  There are small foci of subcentimeter infarction affecting the left occipital pole.  None of these show hemorrhage or midline shift.  Their distribution, with involvement of multiple vascular territories including the superior cerebellar artery, posterior cerebral artery, and posterior inferior cerebral artery, suggests a shower of emboli to the left vertebral artery.  No mass lesion, hydrocephalus, or extra-axial fluid is seen.  There is moderate age related atrophy.  There is extensive chronic microvascular ischemic change throughout the periventricular and subcortical white matter.  Remote cortical infarcts affect the left frontal and right parietal regions. Scattered remote lacunes are present.  Tiny foci of chronic hemorrhage suggest hypertensive related cerebrovascular disease.  The major intracranial vessels structures are patent.  There is no midline shift.  Pituitary and cerebellar tonsils are unremarkable.  There is no acute sinus or mastoid disease.  IMPRESSION: Multiple areas of acute infarction affect the posterior circulation on the right, most notably the right cerebellar hemisphere. Question of left vertebral embolus is raised.  Atrophy and chronic microvascular ischemic change, with multiple foci of chronic infarction.  MRA HEAD  Findings: Widely patent internal carotid arteries.  Basilar artery widely patent with left greater than right vertebral contributions. There is no left or right vertebral artery stenosis.  There is no proximal stenosis of the anterior or posterior cerebral arteries. There is a 50% stenosis of the distal M1 segment of the left middle cerebral artery.  Right MCA is widely patent.  Distal MCA and PCA branches show intracranial atherosclerotic change with mild irregularity.  In the posterior circulation, the left superior cerebellar artery appears widely patent.  The left posterior inferior cerebellar artery is patent at its origin,  but flow related enhancement distal to this is poorly visualized.  IMPRESSION: Intracranial atherosclerotic change as described.  Widely patent left vertebral artery with no basilar stenosis.  No clear-cut proximal left cerebellar branch occlusion.  50% stenosis left MCA, distal M1 segment. Original Report Authenticated By: Elsie Stain, M.D.    Medications: I have reviewed the patient's current medications. Scheduled Meds:    . amLODipine  5 mg Oral Daily  . aspirin  81 mg Oral Daily  . benazepril  40 mg Oral Daily  . clopidogrel  75 mg Oral Q breakfast  . enoxaparin (LOVENOX) injection  40 mg Subcutaneous Q24H  . famotidine  20 mg Oral Daily  . metoprolol tartrate  25 mg Oral BID  . rosuvastatin  20 mg Oral Daily   Continuous Infusions:    . sodium chloride 1,000 mL (05/14/11 0437)   PRN Meds:.acetaminophen, acetaminophen, cyclobenzaprine, iohexol, ondansetron (ZOFRAN) IV, ondansetron  Assessment/Plan: 1.  Acute CVA affecting the  posterior circulation on the right, most notably the right cerebellar hemisphere.   2-D echocardiogram done and results pending.  PT and OT pending. Appreciate neurology evaluation. Continue Plavix for now.  CT angiogram neck showed calcific plaque in both subclavian arteries left greater than right.  2.  History of A. fib status post Maze procedure currently in sinus.  Stable.  3.  History of mitral valve repair.  4.  Hypertension.  Blood pressure improved.  5.  Low TSH.  Free T4 pending.  6.  Prophylaxis.  Lovenox for DVT prophylaxis.  7.  Disposition.  Pending 2-D echocardiogram read and PT and OT evaluation.   LOS: 2 days  Annaleigha Woo A, MD 05/15/2011, 1:55 PM

## 2011-05-15 NOTE — Progress Notes (Signed)
  Echocardiogram 2D Echocardiogram has been performed.  Arthur [name], Real Cons 05/15/2011, 12:13 PM

## 2011-05-15 NOTE — Progress Notes (Signed)
Physical Therapy Evaluation Patient Details Name: Arthur [name] MRN: [id number] DOB: [birth year] Today's Date: 05/15/2011  Problem List:  Patient Active Problem List  Diagnoses  . Hyperlipidemia  . Hypertension  . Coronary artery disease  . MVP (mitral valve prolapse)  . Atrial fibrillation  . Chronic renal insufficiency  . CVA (cerebrovascular accident)  . Dizziness  . CVA (cerebral infarction)    Past Medical History:  Past Medical History  Diagnosis Date  . Hyperlipidemia   . Hypertension   . Coronary artery disease   . MVP (mitral valve prolapse)   . Atrial fibrillation   . Chronic renal insufficiency   . History of recurrent TIAs   . Hypertensive vascular disease   . CVA (cerebrovascular accident)     RIGHT BRAIN   Past Surgical History:  Past Surgical History  Procedure Date  . Cardiac catheterization 03/19/2004  . Maze 2005  . Coronary artery bypass graft 2005    LIMA GRAFT TO THE DISTAL LAD, SAPHENOUS VEIN GRAFT TO THE FIRST DIADGONAL BRANCH, SAPHENOUS VEIN GRAFT TO THE THIRD MARIGINAL BRANCH, AND SAPHENOUS VEIN GRAFT TO THE PDA  . Mitral valve repair     PT Assessment/Plan/Recommendation PT Assessment Clinical Impression Statement: Pt with mild balance impairments and pt educated for implications of these and precautions. Pt near baseline with appropriate assist available for return home. Pt agreeable to no further needs and educated that OPPT for balance can always be readdressed at any time. PT Recommendation/Assessment: Patent does not need any further PT services No Skilled PT: All education completed;Patient at baseline level of functioning;Patient will have necessary level of assist by caregiver at discharge PT Recommendation Follow Up Recommendations: None Equipment Recommended: None recommended by PT PT Goals     PT Evaluation Precautions/Restrictions  Precautions Precautions: Fall Prior Functioning  Home Living Lives With: Spouse Type of  Home: House Home Layout: One level Home Access: Stairs to enter Entrance Stairs-Rails: Left Entrance Stairs-Number of Steps: 4 Bathroom Shower/Tub: Psychologist, counselling;Tub/shower unit Bathroom Toilet: Standard Home Adaptive Equipment: None Prior Function Level of Independence: Independent with basic ADLs;Independent with transfers;Independent with homemaking with ambulation;Independent with gait Driving: Yes Vocation: Retired Comments: wife available 24 for supervision Cognition Cognition Arousal/Alertness: Awake/alert Overall Cognitive Status: Appears within functional limits for tasks assessed Orientation Level: Oriented X4 Sensation/Coordination   Extremity Assessment RUE Assessment RUE Assessment: Within Functional Limits LUE Assessment LUE Assessment: Within Functional Limits RLE Assessment RLE Assessment: Within Functional Limits LLE Assessment LLE Assessment: Within Functional Limits Mobility (including Balance) Bed Mobility Bed Mobility: Yes Supine to Sit: 6: Modified independent (Device/Increase time);HOB flat Transfers Transfers: Yes Sit to Stand: 7: Independent Stand to Sit: 7: Independent Stand Pivot Transfers: 7: Independent Ambulation/Gait Ambulation/Gait: Yes Ambulation/Gait Assistance: 7: Independent Ambulation Distance (Feet): 350 Feet Assistive device: None Gait Pattern: Within Functional Limits Stairs: Yes Stairs Assistance: 6: Modified independent (Device/Increase time) Stair Management Technique: One rail Left Number of Stairs: 4  Height of Stairs: 8   Balance Balance Assessed: Yes Berg Balance Test Sit to Stand: Able to stand without using hands and stabilize independently Standing Unsupported: Able to stand safely 2 minutes Sitting with Back Unsupported but Feet Supported on Floor or Stool: Able to sit safely and securely 2 minutes Stand to Sit: Sits safely with minimal use of hands Transfers: Able to transfer safely, minor use of  hands Standing Unsupported with Eyes Closed: Able to stand 10 seconds with supervision Standing Ubsupported with Feet Together: Able to place feet together independently and  stand 1 minute safely From Standing, Reach Forward with Outstretched Arm: Can reach confidently >25 cm (10") From Standing Position, Pick up Object from Floor: Able to pick up shoe safely and easily From Standing Position, Turn to Look Behind Over each Shoulder: Looks behind from both sides and weight shifts well Turn 360 Degrees: Able to turn 360 degrees safely in 4 seconds or less Standing Unsupported, Alternately Place Feet on Step/Stool: Able to stand independently and safely and complete 8 steps in 20 seconds Standing Unsupported, One Foot in Front: Able to plae foot ahead of the other independently and hold 30 seconds Standing on One Leg: Able to lift leg independently and hold equal to or more than 3 seconds Total Score: 52  Exercise    End of Session PT - End of Session Equipment Utilized During Treatment: Gait belt Activity Tolerance: Patient tolerated treatment well Patient left: in chair;with call bell in reach;with family/visitor present General Behavior During Session: [facility] for tasks performed Cognition: [facility] for tasks performed  Delorse Lek 05/15/2011, 2:16 PM  Toney Sang, PT [phone]

## 2011-05-15 NOTE — Progress Notes (Signed)
Stroke Team Progress Note  SUBJECTIVE Arthur [name] is an 77 y.o. male  Who Thursday night at 8 pm  was sitting and watching TV when he had a sudden onset of "feeling dizzy" This lasted for about 20 minutes in which time he had N/V. His wife returned home and noted he was staggering when walking. He feels he was going to the Left>right. Patient was brought to the ED. Patient arrived at the ED at 11:21 and symptoms had all resolved. He now feels back to baseline other than some slight off balance feeling when walking but this is improving.CT on admission was normal but MRI shows left cerebellar infarct. Ct angio shows no large vesssel occlusion but calcific plaques at subclavian origins.Lipid profile is normal. Echo is pending.   OBJECTIVE Most recent Vital Signs: Temp: 98.1 F (36.7 C) (12/01 0635) BP: 127/72 mmHg (12/01 0635) Pulse Rate: 69  (12/01 0635) Respiratory Rate: 20 O2 Saturdation: 98%  CBG (last 3)   Basename 05/13/11 2200  GLUCAP 146*    Diet: Cardiac    Activity: Up with assistance  VTE Prophylaxis:  Walking ad libium  Studies: Results for orders placed during the hospital encounter of 05/13/11 (from the past 24 hour(s))  CARDIAC PANEL(CRET KIN+CKTOT+MB+TROPI)     Status: Normal   Collection Time   05/14/11  8:52 PM      Component Value Range   Total CK 188  7 - 232 (U/L)   CK, MB 2.8  0.3 - 4.0 (ng/mL)   Troponin I <0.30  <0.30 (ng/mL)   Relative Index 1.5  0.0 - 2.5   LIPID PANEL     Status: Normal   Collection Time   05/15/11  7:00 AM      Component Value Range   Cholesterol 147  0 - 200 (mg/dL)   Triglycerides 99  <454 (mg/dL)   HDL 46  >09 (mg/dL)   Total CHOL/HDL Ratio 3.2     VLDL 20  0 - 40 (mg/dL)   LDL Cholesterol 81  0 - 99 (mg/dL)     Dg Chest 2 View  81/19/1478  *RADIOLOGY REPORT*  Clinical Data: Near-syncope; nausea and vomiting.  CHEST - 2 VIEW  Comparison: Chest radiograph performed 12/29/2005  Findings: The lungs are well-aerated.  There  is stable right basilar scarring and associated atelectasis, with underlying pleural thickening.  There is no evidence of focal opacification, pleural effusion or pneumothorax.  The heart is normal in size; the patient is status post median sternotomy, with evidence of prior CABG.  A mitral valve replacement is noted.  No acute osseous abnormalities are seen.  A small to moderate hiatal hernia is seen.  IMPRESSION:  1.  No acute cardiopulmonary process seen. 2.  Stable right basilar pleural thickening and scarring, with associated atelectasis. 3.  Small to moderate hiatal hernia again noted.  Original Report Authenticated By: Tonia Ghent, M.D.   Ct Head Wo Contrast  05/13/2011  *RADIOLOGY REPORT*  Clinical Data: Dizziness; history of TIA.  CT HEAD WITHOUT CONTRAST  Technique:  Contiguous axial images were obtained from the base of the skull through the vertex without contrast.  Comparison: CT of the head performed 12/29/2005, and MRI of the brain performed 10/30/2009  Findings: There is no evidence of acute infarction, mass lesion, or intra- or extra-axial hemorrhage on CT.  A focus of chronic encephalomalacia is noted at the high right parietal lobe, stable from the prior MRI.  An additional focus of encephalomalacia  is noted at the high left frontoparietal region, also stable in appearance.  The posterior fossa, including the cerebellum, brainstem and fourth ventricle, is within normal limits.  The third and lateral ventricles, and basal ganglia are unremarkable in appearance.  No mass effect or midline shift is seen.  There is no evidence of fracture; visualized osseous structures are unremarkable in appearance.  The visualized portions of the orbits are within normal limits.  The paranasal sinuses and mastoid air cells are well-aerated.  No significant soft tissue abnormalities are seen.  IMPRESSION:  1.  No acute intracranial pathology seen on CT. 2.  Small foci of chronic encephalomalacia noted  bilaterally, stable from the prior MRI.  Original Report Authenticated By: Tonia Ghent, M.D.   Ct Angio Neck W/cm &/or Wo/cm  05/15/2011  *RADIOLOGY REPORT*  Clinical Data:  Left cerebellar stroke.  Dizziness.  Nonstenotic calcific plaque both carotid bifurcations.  CT ANGIOGRAPHY NECK  Technique:  Multidetector CT imaging of the neck was performed using the standard protocol during bolus administration of intravenous contrast.  Multiplanar CT image reconstructions including MIPs were obtained to evaluate the vascular anatomy. Carotid stenosis measurements (when applicable) are obtained utilizing NASCET criteria, using the distal internal carotid diameter as the denominator.  Contrast: 75mL OMNIPAQUE IOHEXOL 350 MG/ML IV SOLN  Comparison:  MRI brain earlier in the day.  Findings:  There is conventional branching of the great vessels from the arch.  There is moderate proximal irregularity consisting of heavily calcified plaque within the left subclavian adjacent to the origin the left vertebral which could serve as a source of embolus.  Formal catheter angiogram could provide additional information.  Significant calcific disease is noted in the right subclavian adjacent to the right vertebral origin.  Neither appear to result in a flow limiting ostial stenosis however. Nonstenotic plaque is seen at the origin of the innominate, and left common carotid artery.  Moderately heavy calcific plaque can be seen in the midportion of the left common carotid artery.  This is non flow reducing.  At the right carotid bifurcation there is nonstenotic calcific plaque along the posterior wall extending into the proximal right internal carotid artery.  Moderate dolichoectasia is seen without flow limiting lesions.  Moderately heavy calcific plaque can be seen at the origin of the left internal carotid artery.  There is a less than 50% stenosis at the origin of the left internal carotid artery (2.3/5.0 ratio), and no cervical  internal carotid artery disease is present.  Both vertebrals are patent through the neck without significant stenosis.  Moderate cervical spondylosis is present, most severe at C5-6 and C6-7.  The airway is midline.  No lesion is seen at the lung apex. There has been previous median sternotomy.  There are no visible neck masses.  Limited views through the intracranial compartment demonstrate cytotoxic edema  in the left cerebellar hemisphere redemonstrating the acute infarct displayed on MRI.   Review of the MIP images confirms the above findings.  IMPRESSION: CALCIFIC PLAQUE IN BOTH SUBCLAVIAN ARTERIES, LEFT GREATER THAN RIGHT, LIE ADJACENT TO THE VERTEBRAL ORIGINS.  IT IS POSSIBLE THAT THE LEFT SUBCLAVIAN DISEASE COULD CONTRIBUTE TO THE OBSERVED PATTERN OF LEFT CEREBELLAR INFARCTION.  FORMAL CATHETER ANGIOGRAM COULD BE HELPFUL IN FURTHER EVALUATION.  Original Report Authenticated By: Elsie Stain, M.D.   Mr Angiogram Head Wo Contrast  05/15/2011  **ADDENDUM** CREATED: 05/15/2011 11:42:09  In the first sentence of the Impression, the sentence should read "Multiple areas of acute infarction affect  the posterior circulation on the left, most notably the left cerebellar hemisphere."  **END ADDENDUM** SIGNED BY: Elsie Stain, M.D.   05/15/2011  *RADIOLOGY REPORT*  Clinical Data:  Acute onset of dizziness 12 hours ago.  History of mitral valve repair.  History of atrial fibrillation.  History hypertension.  History of chronic renal insufficiency.  MRI HEAD WITHOUT CONTRAST MRA HEAD WITHOUT CONTRAST  Technique:  Multiplanar, multiecho pulse sequences of the brain and surrounding structures were obtained without intravenous contrast. Angiographic images of the head were obtained using MRA technique without contrast.  Comparison:  CT head 05/13/2011.  MRI HEAD  Findings:  There are multiple foci of acute infarction affecting the posterior circulation on the left.   In the cerebellum, there is a large infarct in the  left inferior and superior cerebellar hemisphere, with lesser involvement of the left superior vermis and left cerebellar tonsils.  There are small foci of subcentimeter infarction affecting the left occipital pole.  None of these show hemorrhage or midline shift.  Their distribution, with involvement of multiple vascular territories including the superior cerebellar artery, posterior cerebral artery, and posterior inferior cerebral artery, suggests a shower of emboli to the left vertebral artery.  No mass lesion, hydrocephalus, or extra-axial fluid is seen.  There is moderate age related atrophy.  There is extensive chronic microvascular ischemic change throughout the periventricular and subcortical white matter.  Remote cortical infarcts affect the left frontal and right parietal regions. Scattered remote lacunes are present.  Tiny foci of chronic hemorrhage suggest hypertensive related cerebrovascular disease.  The major intracranial vessels structures are patent.  There is no midline shift.  Pituitary and cerebellar tonsils are unremarkable.  There is no acute sinus or mastoid disease.  IMPRESSION: Multiple areas of acute infarction affect the posterior circulation on the right, most notably the right cerebellar hemisphere. Question of left vertebral embolus is raised.  Atrophy and chronic microvascular ischemic change, with multiple foci of chronic infarction.  MRA HEAD  Findings: Widely patent internal carotid arteries.  Basilar artery widely patent with left greater than right vertebral contributions. There is no left or right vertebral artery stenosis.  There is no proximal stenosis of the anterior or posterior cerebral arteries. There is a 50% stenosis of the distal M1 segment of the left middle cerebral artery.  Right MCA is widely patent.  Distal MCA and PCA branches show intracranial atherosclerotic change with mild irregularity.  In the posterior circulation, the left superior cerebellar artery appears  widely patent.  The left posterior inferior cerebellar artery is patent at its origin, but flow related enhancement distal to this is poorly visualized.  IMPRESSION: Intracranial atherosclerotic change as described.  Widely patent left vertebral artery with no basilar stenosis.  No clear-cut proximal left cerebellar branch occlusion.  50% stenosis left MCA, distal M1 segment. Original Report Authenticated By: Elsie Stain, M.D.   Mr Brain Wo Contrast  05/15/2011  **ADDENDUM** CREATED: 05/15/2011 11:42:09  In the first sentence of the Impression, the sentence should read "Multiple areas of acute infarction affect the posterior circulation on the left, most notably the left cerebellar hemisphere."  **END ADDENDUM** SIGNED BY: Elsie Stain, M.D.   05/15/2011  *RADIOLOGY REPORT*  Clinical Data:  Acute onset of dizziness 12 hours ago.  History of mitral valve repair.  History of atrial fibrillation.  History hypertension.  History of chronic renal insufficiency.  MRI HEAD WITHOUT CONTRAST MRA HEAD WITHOUT CONTRAST  Technique:  Multiplanar, multiecho pulse sequences of  the brain and surrounding structures were obtained without intravenous contrast. Angiographic images of the head were obtained using MRA technique without contrast.  Comparison:  CT head 05/13/2011.  MRI HEAD  Findings:  There are multiple foci of acute infarction affecting the posterior circulation on the left.   In the cerebellum, there is a large infarct in the left inferior and superior cerebellar hemisphere, with lesser involvement of the left superior vermis and left cerebellar tonsils.  There are small foci of subcentimeter infarction affecting the left occipital pole.  None of these show hemorrhage or midline shift.  Their distribution, with involvement of multiple vascular territories including the superior cerebellar artery, posterior cerebral artery, and posterior inferior cerebral artery, suggests a shower of emboli to the left vertebral  artery.  No mass lesion, hydrocephalus, or extra-axial fluid is seen.  There is moderate age related atrophy.  There is extensive chronic microvascular ischemic change throughout the periventricular and subcortical white matter.  Remote cortical infarcts affect the left frontal and right parietal regions. Scattered remote lacunes are present.  Tiny foci of chronic hemorrhage suggest hypertensive related cerebrovascular disease.  The major intracranial vessels structures are patent.  There is no midline shift.  Pituitary and cerebellar tonsils are unremarkable.  There is no acute sinus or mastoid disease.  IMPRESSION: Multiple areas of acute infarction affect the posterior circulation on the right, most notably the right cerebellar hemisphere. Question of left vertebral embolus is raised.  Atrophy and chronic microvascular ischemic change, with multiple foci of chronic infarction.  MRA HEAD  Findings: Widely patent internal carotid arteries.  Basilar artery widely patent with left greater than right vertebral contributions. There is no left or right vertebral artery stenosis.  There is no proximal stenosis of the anterior or posterior cerebral arteries. There is a 50% stenosis of the distal M1 segment of the left middle cerebral artery.  Right MCA is widely patent.  Distal MCA and PCA branches show intracranial atherosclerotic change with mild irregularity.  In the posterior circulation, the left superior cerebellar artery appears widely patent.  The left posterior inferior cerebellar artery is patent at its origin, but flow related enhancement distal to this is poorly visualized.  IMPRESSION: Intracranial atherosclerotic change as described.  Widely patent left vertebral artery with no basilar stenosis.  No clear-cut proximal left cerebellar branch occlusion.  50% stenosis left MCA, distal M1 segment. Original Report Authenticated By: Elsie Stain, M.D.    Physical Exam:     pleasant awake , alert. Neck is  supple no bruit. Hearing is normal. Cardiac exam no murmur o rgallop. Lungs clear to auscultation. Neurological exam ; Awake  Alert oriented x 3. Normal speech and language.eye movements full without nystagmus. Face symmetric. Tongue midline. Normal strength, tone, reflexes and coordination. Normal sensation. Gait slightly broad based mild atxic..  ASSESSMENT [name] is a 77 y.o. 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 Suspect cardioembolic source.   Hospital day # 2  TREATMENT/PLAN Continue antiplatelet therapy for now but Check TEE and prolonged outpatient cardiac telemetry as outpatient. D/w patient and wife and answered questions.  Gates Rigg, MD [facility] Pager: [phone] 05/15/2011 2:06 PM

## 2011-05-15 NOTE — Progress Notes (Signed)
Occupational Therapy Evaluation Patient Details Name: Arthur [name] MRN: [id number] DOB: [birth year] Today's Date: 05/15/2011  Problem List:  Patient Active Problem List  Diagnoses  . Hyperlipidemia  . Hypertension  . Coronary artery disease  . MVP (mitral valve prolapse)  . Atrial fibrillation  . Chronic renal insufficiency  . CVA (cerebrovascular accident)  . Dizziness  . CVA (cerebral infarction)    Past Medical History:  Past Medical History  Diagnosis Date  . Hyperlipidemia   . Hypertension   . Coronary artery disease   . MVP (mitral valve prolapse)   . Atrial fibrillation   . Chronic renal insufficiency   . History of recurrent TIAs   . Hypertensive vascular disease   . CVA (cerebrovascular accident)     RIGHT BRAIN   Past Surgical History:  Past Surgical History  Procedure Date  . Cardiac catheterization 03/19/2004  . [place] 2005  . Coronary artery bypass graft 2005    LIMA GRAFT TO THE DISTAL LAD, SAPHENOUS VEIN GRAFT TO THE FIRST DIADGONAL BRANCH, SAPHENOUS VEIN GRAFT TO THE THIRD MARIGINAL BRANCH, AND SAPHENOUS VEIN GRAFT TO THE PDA  . Mitral valve repair     OT Assessment/Plan/Recommendation OT Recommendation Equipment Recommended: None recommended by PT OT Goals    OT Evaluation Precautions/Restrictions  Precautions Precautions: Fall Prior Functioning Home Living Lives With: Spouse Type of Home: House Home Layout: One level Home Access: Stairs to enter Entrance Stairs-Rails: Left Entrance Stairs-Number of Steps: 4 Bathroom Shower/Tub: Psychologist, counselling;Tub/shower unit Bathroom Toilet: Standard Home Adaptive Equipment: None Prior Function Level of Independence: Independent with basic ADLs;Independent with transfers;Independent with homemaking with ambulation;Independent with gait Driving: Yes Vocation: Retired Comments: wife available 24 for supervision ADL   Vision/Perception    Cognition Cognition Arousal/Alertness:  Awake/alert Overall Cognitive Status: Appears within functional limits for tasks assessed Orientation Level: Oriented X4 Sensation/Coordination   Extremity Assessment RUE Assessment RUE Assessment: Within Functional Limits LUE Assessment LUE Assessment: Within Functional Limits Mobility  Bed Mobility Bed Mobility: Yes Supine to Sit: 6: Modified independent (Device/Increase time);HOB flat Transfers Sit to Stand: 7: Independent Stand to Sit: 7: Independent Exercises   End of Session General Behavior During Session: [facility] for tasks performed Cognition: [facility] for tasks performed  Spoke with PT Maija - reports pt at or near baseline. Pt independent without acute OT needs. OT to sign off.   Harrel Carina [facility] 05/15/2011, 2:38 PM  Pager: [phone]

## 2011-05-16 MED ORDER — ROSUVASTATIN CALCIUM 20 MG PO TABS: 20.0000 mg | ORAL_TABLET | Freq: Every day | ORAL | Status: DC

## 2011-05-16 NOTE — Discharge Summary (Signed)
Discharge Summary  Arthur [name] MR#: [id number]  DOB:23-Apr-1929  Date of Admission: 05/13/2011 Date of Discharge: 05/16/2011  Patient's PCP: Arthur Hillier, MD Patient's Cardiologist: Dr. Peter [name]  Attending Physician:Arthur [name]  Consults: Neurology - Dr. Pearlean [name]  Discharge Diagnoses: 1. Acute cerebrovascular accident 2. Dizziness secondary to #1 3.  History of [name]. fib status post maze procedure 4.  Hypertension. 5.  Low TSH with normal free T4. 6.  Hyperlipidemia 7.  History of coronary artery disease. 8.  History of mitral valve prolapse 9.  History of recurrent TIAs  Brief Admitting History and Physical Arthur [name] is 77 year old gentleman with history of coronary disease status post CABG, history of [name]. fib status post Maze procedure, history of mitral valve repair, history of TIA who presented with complaints of dizziness on 05/14/2011.  Discharge Medications Current Discharge Medication List    START taking these medications   Details  rosuvastatin (CRESTOR) 20 MG tablet Take 1 tablet (20 mg total) by mouth daily. Qty: 30 tablet, Refills: 0      CONTINUE these medications which have NOT CHANGED   Details  amLODipine (NORVASC) 5 MG tablet Take 5 mg by mouth daily.      aspirin 81 MG tablet Take 81 mg by mouth daily.      benazepril (LOTENSIN) 40 MG tablet Take 1 tablet (40 mg total) by mouth daily. Qty: 90 tablet, Refills: 3   Associated Diagnoses: HTN (hypertension)    clopidogrel (PLAVIX) 75 MG tablet Take 75 mg by mouth daily.      cyclobenzaprine (FLEXERIL) 10 MG tablet Take 10 mg by mouth every 8 (eight) hours as needed. For pain.     metoprolol tartrate (LOPRESSOR) 25 MG tablet Take 25 mg by mouth 2 (two) times daily.      ranitidine (ZANTAC) 150 MG tablet Take 1 tablet (150 mg total) by mouth 2 (two) times daily. Qty: 60 tablet, Refills: 5      STOP taking these medications     dipyridamole-aspirin (AGGRENOX) 25-200 MG per 12 hr capsule  Comments:  Reason for Stopping:       simvastatin (ZOCOR) 40 MG tablet Comments:  Reason for Stopping:          Hospital Course: 1. Acute CVA affecting the posterior circulation on the right, most notably the right cerebellar hemisphere.  Patient had an MRI/MRA of the brain with results as indicated below. 2-D echocardiogram done but results are pending.  Neurology was consult and Dr. Pearlean [name] with neurology evaluated the patient.  Patient had PT/OT and had no therapy needs.  Patient also had CT angiogram of the neck with results as indicated below.  Patient's dizziness during the course of hospital stay improved.  Continue Plavix and ASA. Per neurology will need outpatient TEE and prolonged outpatient cardiac telemetry.  Per neurology patient also does not need to be on Aggrenox as he is already on aspirin and Plavix.  2. History of [name]. fib status post Maze procedure currently in sinus. Stable.  Neurology recommended outpatient cardiac telemetry.  Instructed the patient to followup with his cardiologist Dr. Peter [name] for consideration of outpatient TEE and for the event monitor.  3. History of mitral valve repair.   4. Hypertension. Blood pressure stable.  Further titration of antihypertensive medications to be done as an outpatient.  5. Low TSH and free T4 was checked which was normal.   LDL>100 - No If yes, discharged on statin? - Yes  Afib? - No If yes,  discharged on Coumadin? - No: Has no documentation of V. fib during the course of hospital stay.  Discharge stroke prophylaxis - Antiplatelet med: Aspirin - dose 81 mg and Antiplatelet med: Plavix - dose to 75 mg. Rehab Referral Recommendation:  PT - No: No PT needs identified by PT.  OT - No: No OT needs identified by OT  Speech Therapy - No: No swallowing difficulty.   Day of Discharge BP 153/88  Pulse 65  Temp(Src) 98.3 F (36.8 C) (Oral)  Resp 18  Ht 5\' 7"  (1.702 m)  Wt 78.019 kg (172 lb)  BMI 26.94 kg/m2  SpO2  97%  Results for orders placed during the hospital encounter of 05/13/11 (from the past 48 hour(s))  CARDIAC PANEL(CRET KIN+CKTOT+MB+TROPI)     Status: Normal   Collection Time   05/14/11 12:45 PM      Component Value Range Comment   Total CK 218  7 - 232 (U/L)    CK, MB 3.5  0.3 - 4.0 (ng/mL)    Troponin I <0.30  <0.30 (ng/mL)    Relative Index 1.6  0.0 - 2.5    HEMOGLOBIN A1C     Status: Normal   Collection Time   05/14/11  1:40 PM      Component Value Range Comment   Hemoglobin A1C 5.6  <5.7 (%)    Mean Plasma Glucose 114  <117 (mg/dL)   CARDIAC PANEL(CRET KIN+CKTOT+MB+TROPI)     Status: Normal   Collection Time   05/14/11  8:52 PM      Component Value Range Comment   Total CK 188  7 - 232 (U/L)    CK, MB 2.8  0.3 - 4.0 (ng/mL)    Troponin I <0.30  <0.30 (ng/mL)    Relative Index 1.5  0.0 - 2.5    LIPID PANEL     Status: Normal   Collection Time   05/15/11  7:00 AM      Component Value Range Comment   Cholesterol 147  0 - 200 (mg/dL)    Triglycerides 99  <161 (mg/dL)    HDL 46  >09 (mg/dL)    Total CHOL/HDL Ratio 3.2      VLDL 20  0 - 40 (mg/dL)    LDL Cholesterol 81  0 - 99 (mg/dL)   T4, FREE     Status: Normal   Collection Time   05/15/11  7:00 AM      Component Value Range Comment   Free T4 1.17  0.80 - 1.80 (ng/dL)     Dg Chest 2 View  60/45/4098  *RADIOLOGY REPORT*  Clinical Data: Near-syncope; nausea and vomiting.  CHEST - 2 VIEW  Comparison: Chest radiograph performed 12/29/2005  Findings: The lungs are well-aerated.  There is stable right basilar scarring and associated atelectasis, with underlying pleural thickening.  There is no evidence of focal opacification, pleural effusion or pneumothorax.  The heart is normal in size; the patient is status post median sternotomy, with evidence of prior CABG.  [name] mitral valve replacement is noted.  No acute osseous abnormalities are seen.  [name] small to moderate hiatal hernia is seen.  IMPRESSION:  1.  No acute cardiopulmonary  process seen. 2.  Stable right basilar pleural thickening and scarring, with associated atelectasis. 3.  Small to moderate hiatal hernia again noted.  Original Report Authenticated By: Tonia Ghent, M.D.   Ct Head Wo Contrast  05/13/2011  *RADIOLOGY REPORT*  Clinical Data: Dizziness; history of TIA.  CT  HEAD WITHOUT CONTRAST  Technique:  Contiguous axial images were obtained from the base of the skull through the vertex without contrast.  Comparison: CT of the head performed 12/29/2005, and MRI of the brain performed 10/30/2009  Findings: There is no evidence of acute infarction, mass lesion, or intra- or extra-axial hemorrhage on CT.  [name] focus of chronic encephalomalacia is noted at the high right parietal lobe, stable from the prior MRI.  An additional focus of encephalomalacia is noted at the high left frontoparietal region, also stable in appearance.  The posterior fossa, including the cerebellum, brainstem and fourth ventricle, is within normal limits.  The third and lateral ventricles, and basal ganglia are unremarkable in appearance.  No mass effect or midline shift is seen.  There is no evidence of fracture; visualized osseous structures are unremarkable in appearance.  The visualized portions of the orbits are within normal limits.  The paranasal sinuses and mastoid air cells are well-aerated.  No significant soft tissue abnormalities are seen.  IMPRESSION:  1.  No acute intracranial pathology seen on CT. 2.  Small foci of chronic encephalomalacia noted bilaterally, stable from the prior MRI.  Original Report Authenticated By: Tonia Ghent, M.D.   Ct Angio Neck W/cm &/or Wo/cm  05/15/2011  *RADIOLOGY REPORT*  Clinical Data:  Left cerebellar stroke.  Dizziness.  Nonstenotic calcific plaque both carotid bifurcations.  CT ANGIOGRAPHY NECK  Technique:  Multidetector CT imaging of the neck was performed using the standard protocol during bolus administration of intravenous contrast.  Multiplanar CT image  reconstructions including MIPs were obtained to evaluate the vascular anatomy. Carotid stenosis measurements (when applicable) are obtained utilizing NASCET criteria, using the distal internal carotid diameter as the denominator.  Contrast: 75mL OMNIPAQUE IOHEXOL 350 MG/ML IV SOLN  Comparison:  MRI brain earlier in the day.  Findings:  There is conventional branching of the great vessels from the arch.  There is moderate proximal irregularity consisting of heavily calcified plaque within the left subclavian adjacent to the origin the left vertebral which could serve as [name] source of embolus.  Formal catheter angiogram could provide additional information.  Significant calcific disease is noted in the right subclavian adjacent to the right vertebral origin.  Neither appear to result in [name] flow limiting ostial stenosis however. Nonstenotic plaque is seen at the origin of the innominate, and left common carotid artery.  Moderately heavy calcific plaque can be seen in the midportion of the left common carotid artery.  This is non flow reducing.  At the right carotid bifurcation there is nonstenotic calcific plaque along the posterior wall extending into the proximal right internal carotid artery.  Moderate dolichoectasia is seen without flow limiting lesions.  Moderately heavy calcific plaque can be seen at the origin of the left internal carotid artery.  There is [name] less than 50% stenosis at the origin of the left internal carotid artery (2.3/5.0 ratio), and no cervical internal carotid artery disease is present.  Both vertebrals are patent through the neck without significant stenosis.  Moderate cervical spondylosis is present, most severe at C5-6 and C6-7.  The airway is midline.  No lesion is seen at the lung apex. There has been previous median sternotomy.  There are no visible neck masses.  Limited views through the intracranial compartment demonstrate cytotoxic edema  in the left cerebellar hemisphere redemonstrating  the acute infarct displayed on MRI.   Review of the MIP images confirms the above findings.  IMPRESSION: CALCIFIC PLAQUE IN BOTH SUBCLAVIAN ARTERIES,  LEFT GREATER THAN RIGHT, LIE ADJACENT TO THE VERTEBRAL ORIGINS.  IT IS POSSIBLE THAT THE LEFT SUBCLAVIAN DISEASE COULD CONTRIBUTE TO THE OBSERVED PATTERN OF LEFT CEREBELLAR INFARCTION.  FORMAL CATHETER ANGIOGRAM COULD BE HELPFUL IN FURTHER EVALUATION.  Original Report Authenticated By: Elsie Stain, M.D.   Mr Angiogram Head Wo Contrast  05/15/2011  **ADDENDUM** CREATED: 05/15/2011 11:42:09  In the first sentence of the Impression, the sentence should read "Multiple areas of acute infarction affect the posterior circulation on the left, most notably the left cerebellar hemisphere."  **END ADDENDUM** SIGNED BY: Elsie Stain, M.D.   05/15/2011  *RADIOLOGY REPORT*  Clinical Data:  Acute onset of dizziness 12 hours ago.  History of mitral valve repair.  History of atrial fibrillation.  History hypertension.  History of chronic renal insufficiency.  MRI HEAD WITHOUT CONTRAST MRA HEAD WITHOUT CONTRAST  Technique:  Multiplanar, multiecho pulse sequences of the brain and surrounding structures were obtained without intravenous contrast. Angiographic images of the head were obtained using MRA technique without contrast.  Comparison:  CT head 05/13/2011.  MRI HEAD  Findings:  There are multiple foci of acute infarction affecting the posterior circulation on the left.   In the cerebellum, there is [name] large infarct in the left inferior and superior cerebellar hemisphere, with lesser involvement of the left superior vermis and left cerebellar tonsils.  There are small foci of subcentimeter infarction affecting the left occipital pole.  None of these show hemorrhage or midline shift.  Their distribution, with involvement of multiple vascular territories including the superior cerebellar artery, posterior cerebral artery, and posterior inferior cerebral artery, suggests [name] shower  of emboli to the left vertebral artery.  No mass lesion, hydrocephalus, or extra-axial fluid is seen.  There is moderate age related atrophy.  There is extensive chronic microvascular ischemic change throughout the periventricular and subcortical white matter.  Remote cortical infarcts affect the left frontal and right parietal regions. Scattered remote lacunes are present.  Tiny foci of chronic hemorrhage suggest hypertensive related cerebrovascular disease.  The major intracranial vessels structures are patent.  There is no midline shift.  Pituitary and cerebellar tonsils are unremarkable.  There is no acute sinus or mastoid disease.  IMPRESSION: Multiple areas of acute infarction affect the posterior circulation on the right, most notably the right cerebellar hemisphere. Question of left vertebral embolus is raised.  Atrophy and chronic microvascular ischemic change, with multiple foci of chronic infarction.  MRA HEAD  Findings: Widely patent internal carotid arteries.  Basilar artery widely patent with left greater than right vertebral contributions. There is no left or right vertebral artery stenosis.  There is no proximal stenosis of the anterior or posterior cerebral arteries. There is [name] 50% stenosis of the distal M1 segment of the left middle cerebral artery.  Right MCA is widely patent.  Distal MCA and PCA branches show intracranial atherosclerotic change with mild irregularity.  In the posterior circulation, the left superior cerebellar artery appears widely patent.  The left posterior inferior cerebellar artery is patent at its origin, but flow related enhancement distal to this is poorly visualized.  IMPRESSION: Intracranial atherosclerotic change as described.  Widely patent left vertebral artery with no basilar stenosis.  No clear-cut proximal left cerebellar branch occlusion.  50% stenosis left MCA, distal M1 segment. Original Report Authenticated By: Elsie Stain, M.D.   Mr Brain Wo  Contrast  05/15/2011  **ADDENDUM** CREATED: 05/15/2011 11:42:09  In the first sentence of the Impression, the sentence should read "Multiple  areas of acute infarction affect the posterior circulation on the left, most notably the left cerebellar hemisphere."  **END ADDENDUM** SIGNED BY: Elsie Stain, M.D.   05/15/2011  *RADIOLOGY REPORT*  Clinical Data:  Acute onset of dizziness 12 hours ago.  History of mitral valve repair.  History of atrial fibrillation.  History hypertension.  History of chronic renal insufficiency.  MRI HEAD WITHOUT CONTRAST MRA HEAD WITHOUT CONTRAST  Technique:  Multiplanar, multiecho pulse sequences of the brain and surrounding structures were obtained without intravenous contrast. Angiographic images of the head were obtained using MRA technique without contrast.  Comparison:  CT head 05/13/2011.  MRI HEAD  Findings:  There are multiple foci of acute infarction affecting the posterior circulation on the left.   In the cerebellum, there is [name] large infarct in the left inferior and superior cerebellar hemisphere, with lesser involvement of the left superior vermis and left cerebellar tonsils.  There are small foci of subcentimeter infarction affecting the left occipital pole.  None of these show hemorrhage or midline shift.  Their distribution, with involvement of multiple vascular territories including the superior cerebellar artery, posterior cerebral artery, and posterior inferior cerebral artery, suggests [name] shower of emboli to the left vertebral artery.  No mass lesion, hydrocephalus, or extra-axial fluid is seen.  There is moderate age related atrophy.  There is extensive chronic microvascular ischemic change throughout the periventricular and subcortical white matter.  Remote cortical infarcts affect the left frontal and right parietal regions. Scattered remote lacunes are present.  Tiny foci of chronic hemorrhage suggest hypertensive related cerebrovascular disease.  The major  intracranial vessels structures are patent.  There is no midline shift.  Pituitary and cerebellar tonsils are unremarkable.  There is no acute sinus or mastoid disease.  IMPRESSION: Multiple areas of acute infarction affect the posterior circulation on the right, most notably the right cerebellar hemisphere. Question of left vertebral embolus is raised.  Atrophy and chronic microvascular ischemic change, with multiple foci of chronic infarction.  MRA HEAD  Findings: Widely patent internal carotid arteries.  Basilar artery widely patent with left greater than right vertebral contributions. There is no left or right vertebral artery stenosis.  There is no proximal stenosis of the anterior or posterior cerebral arteries. There is [name] 50% stenosis of the distal M1 segment of the left middle cerebral artery.  Right MCA is widely patent.  Distal MCA and PCA branches show intracranial atherosclerotic change with mild irregularity.  In the posterior circulation, the left superior cerebellar artery appears widely patent.  The left posterior inferior cerebellar artery is patent at its origin, but flow related enhancement distal to this is poorly visualized.  IMPRESSION: Intracranial atherosclerotic change as described.  Widely patent left vertebral artery with no basilar stenosis.  No clear-cut proximal left cerebellar branch occlusion.  50% stenosis left MCA, distal M1 segment. Original Report Authenticated By: Elsie Stain, M.D.     Disposition: Home with followup as indicated below.  Diet: Heart healthy diet  Activity: Resume as tolerated no PT or OT needs.   Follow-up Appts: Discharge Orders    Future Orders Please Complete By Expires   Diet - low sodium heart healthy      Increase activity slowly      Discharge instructions      Comments:   Followup with Arthur Hillier, MD (PCP) in 1 week. Please call Dr. Elvis Coil office (Cardiology) tomorrow to arrange outpatient transesophageal echocardiogram and  outpatient event monitor. Please followup with Dr.  Love or Dr. Pearlean [name] (Neurology) in 2 months.     Follow-up Information    Follow up with [facility]. Make an appointment in 1 week. (For post stroke followup and results of the 2D Echocardiogram.)       Follow up with Arthur [place], MD in 1 day. (Please call to arrange for outpatient transesophageal echocardiogram and outpatient event monitor.)    Contact information:   [address] [place] [address] [phone]       Follow up with Evie Lacks, MD. Make an appointment in 2 months. (Or Dr. Pearlean [name] in 2 months.)    Contact information:   [address] [place] [address] [phone]          TESTS THAT NEED FOLLOW-UP 2-D echocardiogram, patient was instructed to follow up with PCP or his cardiologist regarding the echocardiogram results.  Time spent on discharge, talking to the patient, and coordinating care: 40 mins.   Signed: Cristal Ford, MD 05/16/2011, 11:57 AM

## 2011-05-16 NOTE — Progress Notes (Signed)
Subjective: Reports that his dizziness is improved.  No other specific complaints.  Objective: Vital signs in last 24 hours: Filed Vitals:   05/15/11 1403 05/15/11 1500 05/15/11 2159 05/16/11 0528  BP: 125/76  155/86 153/88  Pulse: 77  69 65  Temp: 98.8 F (37.1 C)  98.3 F (36.8 C) 98.3 F (36.8 C)  TempSrc: Oral  Oral Oral  Resp: 20  18 18  Height:  5' 7" (1.702 m)    Weight:  78.019 kg (172 lb)    SpO2: 97%  97% 97%   Weight change:   Intake/Output Summary (Last 24 hours) at 05/16/11 1119 Last data filed at 05/16/11 0551  Gross per 24 hour  Intake      0 ml  Output   1300 ml  Net  -1300 ml   Physical Exam: General: Awake, Oriented, No acute distress. HEENT: EOMI. Neck: Supple CV: S1 and S2 Lungs: Clear to ascultation bilaterally Abdomen: Soft, Nontender, Nondistended, +bowel sounds. Ext: Good pulses. Trace edema.  Lab Results:  Basename 05/14/11 0445 05/13/11 2205  NA 139 138  K 4.7 4.0  CL 102 102  CO2 26 25  GLUCOSE 158* 146*  BUN 22 25*  CREATININE 1.08 1.10  CALCIUM 9.7 9.5  MG 2.1 --  PHOS -- --    Basename 05/14/11 0445 05/13/11 2205  AST 22 25  ALT 17 17  ALKPHOS 63 67  BILITOT 0.5 0.4  PROT 7.2 7.3  ALBUMIN 4.2 4.2    Basename 05/13/11 2205  LIPASE 35  AMYLASE --    Basename 05/14/11 0445 05/13/11 2205  WBC 10.4 11.3*  NEUTROABS -- --  HGB 15.4 15.8  HCT 45.8 45.6  MCV 90.2 90.1  PLT 229 214    Basename 05/14/11 2052 05/14/11 1245 05/14/11 0445  CKTOTAL 188 218 184  CKMB 2.8 3.5 3.6  CKMBINDEX -- -- --  TROPONINI <0.30 <0.30 <0.30   No results found for this basename: POCBNP:3 in the last 72 hours No results found for this basename: DDIMER:2 in the last 72 hours  Basename 05/14/11 1340  HGBA1C 5.6    Basename 05/15/11 0700  CHOL 147  HDL 46  LDLCALC 81  TRIG 99  CHOLHDL 3.2  LDLDIRECT --    Basename 05/14/11 0445  TSH 0.293*  T4TOTAL --  T3FREE --  THYROIDAB --   No results found for this basename:  VITAMINB12:2,FOLATE:2,FERRITIN:2,TIBC:2,IRON:2,RETICCTPCT:2 in the last 72 hours  Micro Results: No results found for this or any previous visit (from the past 240 hour(s)).  Studies/Results: Ct Angio Neck W/cm &/or Wo/cm  05/15/2011  *RADIOLOGY REPORT*  Clinical Data:  Left cerebellar stroke.  Dizziness.  Nonstenotic calcific plaque both carotid bifurcations.  CT ANGIOGRAPHY NECK  Technique:  Multidetector CT imaging of the neck was performed using the standard protocol during bolus administration of intravenous contrast.  Multiplanar CT image reconstructions including MIPs were obtained to evaluate the vascular anatomy. Carotid stenosis measurements (when applicable) are obtained utilizing NASCET criteria, using the distal internal carotid diameter as the denominator.  Contrast: 75mL OMNIPAQUE IOHEXOL 350 MG/ML IV SOLN  Comparison:  MRI brain earlier in the day.  Findings:  There is conventional branching of the great vessels from the arch.  There is moderate proximal irregularity consisting of heavily calcified plaque within the left subclavian adjacent to the origin the left vertebral which could serve as a source of embolus.  Formal catheter angiogram could provide additional information.  Significant calcific disease is noted in   the right subclavian adjacent to the right vertebral origin.  Neither appear to result in a flow limiting ostial stenosis however. Nonstenotic plaque is seen at the origin of the innominate, and left common carotid artery.  Moderately heavy calcific plaque can be seen in the midportion of the left common carotid artery.  This is non flow reducing.  At the right carotid bifurcation there is nonstenotic calcific plaque along the posterior wall extending into the proximal right internal carotid artery.  Moderate dolichoectasia is seen without flow limiting lesions.  Moderately heavy calcific plaque can be seen at the origin of the left internal carotid artery.  There is a less than  50% stenosis at the origin of the left internal carotid artery (2.3/5.0 ratio), and no cervical internal carotid artery disease is present.  Both vertebrals are patent through the neck without significant stenosis.  Moderate cervical spondylosis is present, most severe at C5-6 and C6-7.  The airway is midline.  No lesion is seen at the lung apex. There has been previous median sternotomy.  There are no visible neck masses.  Limited views through the intracranial compartment demonstrate cytotoxic edema  in the left cerebellar hemisphere redemonstrating the acute infarct displayed on MRI.   Review of the MIP images confirms the above findings.  IMPRESSION: CALCIFIC PLAQUE IN BOTH SUBCLAVIAN ARTERIES, LEFT GREATER THAN RIGHT, LIE ADJACENT TO THE VERTEBRAL ORIGINS.  IT IS POSSIBLE THAT THE LEFT SUBCLAVIAN DISEASE COULD CONTRIBUTE TO THE OBSERVED PATTERN OF LEFT CEREBELLAR INFARCTION.  FORMAL CATHETER ANGIOGRAM COULD BE HELPFUL IN FURTHER EVALUATION.  Original Report Authenticated By: JOHN T. CURNES, M.D.    Medications: I have reviewed the patient's current medications. Scheduled Meds:    . amLODipine  5 mg Oral Daily  . aspirin  81 mg Oral Daily  . benazepril  40 mg Oral Daily  . clopidogrel  75 mg Oral Q breakfast  . enoxaparin (LOVENOX) injection  40 mg Subcutaneous Q24H  . famotidine  20 mg Oral Daily  . metoprolol tartrate  25 mg Oral BID  . rosuvastatin  20 mg Oral Daily   Continuous Infusions:    . sodium chloride 50 mL/hr at 05/16/11 0551   PRN Meds:.acetaminophen, acetaminophen, cyclobenzaprine, ondansetron (ZOFRAN) IV, ondansetron  Assessment/Plan: 1.  Acute CVA affecting the posterior circulation on the right, most notably the right cerebellar hemisphere.   2-D echocardiogram results pending.  No PT and OT needs. Continue Plavix and ASA. Per neurology will need outpatient TEE and prolonged outpatient cardiac telemetry,  2.  History of A. fib status post Maze procedure currently in  sinus.  Stable.  3.  History of mitral valve repair.  4.  Hypertension.  Blood pressure stable.  5.  Low TSH.  Free T4 normal.  6.  Prophylaxis.  Lovenox for DVT prophylaxis.  7.  Disposition.  Discharge patient home today with outpatient followup. Patient instructed to followup with his PCP or cardiologist regarding 2-D echocardiogram results..   LOS: 3 days  Kynley Metzger A, MD 05/16/2011, 11:19 AM 

## 2011-05-16 NOTE — Progress Notes (Signed)
Subjective:  doing well. No changes or complaints. goinhghome today.  Objective: Vital signs in last 24 hours: Temp:  [98.3 F (36.8 C)-98.8 F (37.1 C)] 98.3 F (36.8 C) (12/02 0528) Pulse Rate:  [65-77] 65  (12/02 0528) Resp:  [18-20] 18  (12/02 0528) BP: (125-155)/(76-88) 153/88 mmHg (12/02 0528) SpO2:  [97 %] 97 % (12/02 0528) Weight:  [78.019 kg (172 lb)] 172 lb (78.019 kg) (12/01 1500) Weight change:  Last BM Date: 05/14/11  Intake/Output from previous day: 12/01 0701 - 12/02 0700 In: -  Out: 1500 [Urine:1500] Intake/Output this shift: Total I/O In: -  Out: 300 [Urine:300]   Physical Exam:  pleasant awake , alert. Neck is supple no bruit. Hearing is normal. Cardiac exam no murmur o rgallop. Lungs clear to auscultation.  Neurological exam ;  Awake Alert oriented x 3. Normal speech and language.eye movements full without nystagmus. Face symmetric. Tongue midline.  Normal strength, tone, reflexes and coordination. Normal sensation. Gait slightly broad based mild ataxic   Lab Results:  Basename 05/14/11 0445 05/13/11 2205  WBC 10.4 11.3*  HGB 15.4 15.8  HCT 45.8 45.6  PLT 229 214   BMET  Basename 05/14/11 0445 05/13/11 2205  NA 139 138  K 4.7 4.0  CL 102 102  CO2 26 25  GLUCOSE 158* 146*  BUN 22 25*  CREATININE 1.08 1.10  CALCIUM 9.7 9.5    Studies/Results: Ct Angio Neck W/cm &/or Wo/cm  05/15/2011  *RADIOLOGY REPORT*  Clinical Data:  Left cerebellar stroke.  Dizziness.  Nonstenotic calcific plaque both carotid bifurcations.  CT ANGIOGRAPHY NECK  Technique:  Multidetector CT imaging of the neck was performed using the standard protocol during bolus administration of intravenous contrast.  Multiplanar CT image reconstructions including MIPs were obtained to evaluate the vascular anatomy. Carotid stenosis measurements (when applicable) are obtained utilizing NASCET criteria, using the distal internal carotid diameter as the denominator.  Contrast: 75mL  OMNIPAQUE IOHEXOL 350 MG/ML IV SOLN  Comparison:  MRI brain earlier in the day.  Findings:  There is conventional branching of the great vessels from the arch.  There is moderate proximal irregularity consisting of heavily calcified plaque within the left subclavian adjacent to the origin the left vertebral which could serve as a source of embolus.  Formal catheter angiogram could provide additional information.  Significant calcific disease is noted in the right subclavian adjacent to the right vertebral origin.  Neither appear to result in a flow limiting ostial stenosis however. Nonstenotic plaque is seen at the origin of the innominate, and left common carotid artery.  Moderately heavy calcific plaque can be seen in the midportion of the left common carotid artery.  This is non flow reducing.  At the right carotid bifurcation there is nonstenotic calcific plaque along the posterior wall extending into the proximal right internal carotid artery.  Moderate dolichoectasia is seen without flow limiting lesions.  Moderately heavy calcific plaque can be seen at the origin of the left internal carotid artery.  There is a less than 50% stenosis at the origin of the left internal carotid artery (2.3/5.0 ratio), and no cervical internal carotid artery disease is present.  Both vertebrals are patent through the neck without significant stenosis.  Moderate cervical spondylosis is present, most severe at C5-6 and C6-7.  The airway is midline.  No lesion is seen at the lung apex. There has been previous median sternotomy.  There are no visible neck masses.  Limited views through the intracranial compartment demonstrate  cytotoxic edema  in the left cerebellar hemisphere redemonstrating the acute infarct displayed on MRI.   Review of the MIP images confirms the above findings.  IMPRESSION: CALCIFIC PLAQUE IN BOTH SUBCLAVIAN ARTERIES, LEFT GREATER THAN RIGHT, LIE ADJACENT TO THE VERTEBRAL ORIGINS.  IT IS POSSIBLE THAT THE LEFT  SUBCLAVIAN DISEASE COULD CONTRIBUTE TO THE OBSERVED PATTERN OF LEFT CEREBELLAR INFARCTION.  FORMAL CATHETER ANGIOGRAM COULD BE HELPFUL IN FURTHER EVALUATION.  Original Report Authenticated By: Elsie Stain, M.D.       Medications: I have reviewed the patient's current medications.  Assessment/Plan:  Arthur [name] is a 77 y.o. 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 Suspect cardioembolic source. Plan : Dc home today on ASPIRIN plus Plavix. OUT patient TEE and prolonged cardiac telemetry. Follow up with me in 2 months.D/W Dr Betti Cruz  LOS: 3 days   Arthur [name],Arthur [name] 05/16/2011, 12:06 PM

## 2011-05-17 ENCOUNTER — Other Ambulatory Visit: Payer: Self-pay | Admitting: Cardiology

## 2011-05-17 ENCOUNTER — Telehealth: Payer: Self-pay | Admitting: Cardiology

## 2011-05-17 DIAGNOSIS — I639 Cerebral infarction, unspecified: Secondary | ICD-10-CM

## 2011-05-17 NOTE — Telephone Encounter (Signed)
Per wife was in hospital over the week for possible stroke. States he needs TEE. Per Dr. Swaz[place]and will schedule for Wed 12/5 at 2:00.  Gave instructions to wife.

## 2011-05-17 NOTE — Telephone Encounter (Signed)
Pt's wife calling to set up TEE per dr reedy at [facility] due to mild stroke

## 2011-05-19 ENCOUNTER — Encounter (HOSPITAL_COMMUNITY): Payer: Self-pay | Admitting: *Deleted

## 2011-05-19 ENCOUNTER — Encounter (HOSPITAL_COMMUNITY)
Admission: RE | Disposition: A | Discharge status: Home / Self Care | Payer: Self-pay | Source: Ambulatory Visit | Attending: Cardiology

## 2011-05-19 ENCOUNTER — Ambulatory Visit (HOSPITAL_COMMUNITY)
Admission: RE | Admit: 2011-05-19 | Discharge: 2011-05-19 | Disposition: A | Discharge status: Home / Self Care | Payer: Medicare Other | Source: Ambulatory Visit | Attending: Cardiology | Admitting: Cardiology

## 2011-05-19 DIAGNOSIS — Z0389 Encounter for observation for other suspected diseases and conditions ruled out: Secondary | ICD-10-CM | POA: Insufficient documentation

## 2011-05-19 DIAGNOSIS — I6789 Other cerebrovascular disease: Secondary | ICD-10-CM

## 2011-05-19 DIAGNOSIS — I639 Cerebral infarction, unspecified: Secondary | ICD-10-CM

## 2011-05-19 HISTORY — PX: TEE WITHOUT CARDIOVERSION: SHX5443

## 2011-05-19 SURGERY — ECHOCARDIOGRAM, TRANSESOPHAGEAL
Anesthesia: Moderate Sedation

## 2011-05-19 MED ORDER — SODIUM CHLORIDE 0.9 % IV SOLN
250.0000 mL | INTRAVENOUS | Status: DC | PRN
  Administered 2011-05-19: 500 mL via INTRAVENOUS

## 2011-05-19 MED ORDER — MIDAZOLAM HCL 10 MG/2ML IJ SOLN: 10.0000 mg | Freq: Once | INTRAMUSCULAR | Status: DC

## 2011-05-19 MED ORDER — SODIUM CHLORIDE 0.9 % IJ SOLN: 3.0000 mL | Freq: Two times a day (BID) | INTRAMUSCULAR | Status: DC

## 2011-05-19 MED ORDER — BUTAMBEN-TETRACAINE-BENZOCAINE 2-2-14 % EX AERO
INHALATION_SPRAY | CUTANEOUS | Status: DC | PRN
  Administered 2011-05-19: 2 via TOPICAL

## 2011-05-19 MED ORDER — FENTANYL CITRATE 0.05 MG/ML IJ SOLN
INTRAMUSCULAR | Status: AC
  Filled 2011-05-19: qty 2

## 2011-05-19 MED ORDER — MIDAZOLAM HCL 10 MG/2ML IJ SOLN
INTRAMUSCULAR | Status: AC
  Filled 2011-05-19: qty 2

## 2011-05-19 MED ORDER — FENTANYL CITRATE 0.05 MG/ML IJ SOLN
INTRAMUSCULAR | Status: DC | PRN
  Administered 2011-05-19 (×2): 25 ug via INTRAVENOUS

## 2011-05-19 MED ORDER — BENZOCAINE 20 % MT SOLN: 1.0000 "application " | OROMUCOSAL | Status: DC | PRN

## 2011-05-19 MED ORDER — FENTANYL CITRATE 0.05 MG/ML IJ SOLN: 250.0000 ug | Freq: Once | INTRAMUSCULAR | Status: DC

## 2011-05-19 MED ORDER — MIDAZOLAM HCL 10 MG/2ML IJ SOLN
INTRAMUSCULAR | Status: DC | PRN
  Administered 2011-05-19: 1 mg via INTRAVENOUS
  Administered 2011-05-19: 2 mg via INTRAVENOUS

## 2011-05-19 NOTE — Progress Notes (Signed)
  Echocardiogram Echocardiogram Transesophageal has been performed.  Mercy Moore 05/19/2011, 3:25 PM

## 2011-05-19 NOTE — Interval H&P Note (Signed)
History and Physical Interval Note:  05/19/2011 2:04 PM  Arthur [name]  has presented today for surgery, with the diagnosis of possible stroke  The various methods of treatment have been discussed with the patient and family. After consideration of risks, benefits and other options for treatment, the patient has consented to  Procedure(s): TRANSESOPHAGEAL ECHOCARDIOGRAM (TEE) as a surgical intervention .  The patients' history has been reviewed, patient examined, no change in status, stable for surgery.  I have reviewed the patients' chart and labs.  Questions were answered to the patient's satisfaction.     [name]

## 2011-05-19 NOTE — Op Note (Signed)
TEE performed. Preliminary results show satisfactory mitral valve repair with no MR. No LA/LAA thrombus noted. Bubble study is negative for PFO or shunt. Full report to follow. Peter [place]az[place]and MD

## 2011-05-19 NOTE — H&P (View-Only) (Signed)
Subjective: Reports that his dizziness is improved.  No other specific complaints.  Objective: Vital signs in last 24 hours: Filed Vitals:   05/15/11 1403 05/15/11 1500 05/15/11 2159 05/16/11 0528  BP: 125/76  155/86 153/88  Pulse: 77  69 65  Temp: 98.8 F (37.1 C)  98.3 F (36.8 C) 98.3 F (36.8 C)  TempSrc: Oral  Oral Oral  Resp: 20  18 18   Height:  5\' 7"  (1.702 m)    Weight:  78.019 kg (172 lb)    SpO2: 97%  97% 97%   Weight change:   Intake/Output Summary (Last 24 hours) at 05/16/11 1119 Last data filed at 05/16/11 0551  Gross per 24 hour  Intake      0 ml  Output   1300 ml  Net  -1300 ml   Physical Exam: General: Awake, Oriented, No acute distress. HEENT: EOMI. Neck: Supple CV: S1 and S2 Lungs: Clear to ascultation bilaterally Abdomen: Soft, Nontender, Nondistended, +bowel sounds. Ext: Good pulses. Trace edema.  Lab Results:  [facility] 05/14/11 0445 05/13/11 2205  NA 139 138  K 4.7 4.0  CL 102 102  CO2 26 25  GLUCOSE 158* 146*  BUN 22 25*  CREATININE 1.08 1.10  CALCIUM 9.7 9.5  MG 2.1 --  PHOS -- --    Basename 05/14/11 0445 05/13/11 2205  AST 22 25  ALT 17 17  ALKPHOS 63 67  BILITOT 0.5 0.4  PROT 7.2 7.3  ALBUMIN 4.2 4.2    Basename 05/13/11 2205  LIPASE 35  AMYLASE --    Basename 05/14/11 0445 05/13/11 2205  WBC 10.4 11.3*  NEUTROABS -- --  HGB 15.4 15.8  HCT 45.8 45.6  MCV 90.2 90.1  PLT 229 214    Basename 05/14/11 2052 05/14/11 1245 05/14/11 0445  CKTOTAL 188 218 184  CKMB 2.8 3.5 3.6  CKMBINDEX -- -- --  TROPONINI <0.30 <0.30 <0.30   No results found for this basename: POCBNP:3 in the last 72 hours No results found for this basename: DDIMER:2 in the last 72 hours  Basename 05/14/11 1340  HGBA1C 5.6    Basename 05/15/11 0700  CHOL 147  HDL 46  LDLCALC 81  TRIG 99  CHOLHDL 3.2  LDLDIRECT --    Basename 05/14/11 0445  TSH 0.293*  T4TOTAL --  T3FREE --  THYROIDAB --   No results found for this basename:  VITAMINB12:2,FOLATE:2,FERRITIN:2,TIBC:2,IRON:2,RETICCTPCT:2 in the last 72 hours  Micro Results: No results found for this or any previous visit (from the past 240 hour(s)).  Studies/Results: Ct Angio Neck W/cm &/or Wo/cm  05/15/2011  *RADIOLOGY REPORT*  Clinical Data:  Left cerebellar stroke.  Dizziness.  Nonstenotic calcific plaque both carotid bifurcations.  CT ANGIOGRAPHY NECK  Technique:  Multidetector CT imaging of the neck was performed using the standard protocol during bolus administration of intravenous contrast.  Multiplanar CT image reconstructions including MIPs were obtained to evaluate the vascular anatomy. Carotid stenosis measurements (when applicable) are obtained utilizing NASCET criteria, using the distal internal carotid diameter as the denominator.  Contrast: 75mL OMNIPAQUE IOHEXOL 350 MG/ML IV SOLN  Comparison:  MRI brain earlier in the day.  Findings:  There is conventional branching of the great vessels from the arch.  There is moderate proximal irregularity consisting of heavily calcified plaque within the left subclavian adjacent to the origin the left vertebral which could serve as a source of embolus.  Formal catheter angiogram could provide additional information.  Significant calcific disease is noted in  the right subclavian adjacent to the right vertebral origin.  Neither appear to result in a flow limiting ostial stenosis however. Nonstenotic plaque is seen at the origin of the innominate, and left common carotid artery.  Moderately heavy calcific plaque can be seen in the midportion of the left common carotid artery.  This is non flow reducing.  At the right carotid bifurcation there is nonstenotic calcific plaque along the posterior wall extending into the proximal right internal carotid artery.  Moderate dolichoectasia is seen without flow limiting lesions.  Moderately heavy calcific plaque can be seen at the origin of the left internal carotid artery.  There is a less than  50% stenosis at the origin of the left internal carotid artery (2.3/5.0 ratio), and no cervical internal carotid artery disease is present.  Both vertebrals are patent through the neck without significant stenosis.  Moderate cervical spondylosis is present, most severe at C5-6 and C6-7.  The airway is midline.  No lesion is seen at the lung apex. There has been previous median sternotomy.  There are no visible neck masses.  Limited views through the intracranial compartment demonstrate cytotoxic edema  in the left cerebellar hemisphere redemonstrating the acute infarct displayed on MRI.   Review of the MIP images confirms the above findings.  IMPRESSION: CALCIFIC PLAQUE IN BOTH SUBCLAVIAN ARTERIES, LEFT GREATER THAN RIGHT, LIE ADJACENT TO THE VERTEBRAL ORIGINS.  IT IS POSSIBLE THAT THE LEFT SUBCLAVIAN DISEASE COULD CONTRIBUTE TO THE OBSERVED PATTERN OF LEFT CEREBELLAR INFARCTION.  FORMAL CATHETER ANGIOGRAM COULD BE HELPFUL IN FURTHER EVALUATION.  Original Report Authenticated By: Elsie Stain, M.D.    Medications: I have reviewed the patient's current medications. Scheduled Meds:    . amLODipine  5 mg Oral Daily  . aspirin  81 mg Oral Daily  . benazepril  40 mg Oral Daily  . clopidogrel  75 mg Oral Q breakfast  . enoxaparin (LOVENOX) injection  40 mg Subcutaneous Q24H  . famotidine  20 mg Oral Daily  . metoprolol tartrate  25 mg Oral BID  . rosuvastatin  20 mg Oral Daily   Continuous Infusions:    . sodium chloride 50 mL/hr at 05/16/11 0551   PRN Meds:.acetaminophen, acetaminophen, cyclobenzaprine, ondansetron (ZOFRAN) IV, ondansetron  Assessment/Plan: 1.  Acute CVA affecting the posterior circulation on the right, most notably the right cerebellar hemisphere.   2-D echocardiogram results pending.  No PT and OT needs. Continue Plavix and ASA. Per neurology will need outpatient TEE and prolonged outpatient cardiac telemetry,  2.  History of A. fib status post Maze procedure currently in  sinus.  Stable.  3.  History of mitral valve repair.  4.  Hypertension.  Blood pressure stable.  5.  Low TSH.  Free T4 normal.  6.  Prophylaxis.  Lovenox for DVT prophylaxis.  7.  Disposition.  Discharge patient home today with outpatient followup. Patient instructed to followup with his PCP or cardiologist regarding 2-D echocardiogram results..   LOS: 3 days  Stanton Kissoon A, MD 05/16/2011, 11:19 AM

## 2011-05-20 ENCOUNTER — Encounter (HOSPITAL_COMMUNITY): Payer: Self-pay | Admitting: Cardiology

## 2011-05-20 ENCOUNTER — Encounter (INDEPENDENT_AMBULATORY_CARE_PROVIDER_SITE_OTHER): Payer: Medicare Other

## 2011-05-20 DIAGNOSIS — R55 Syncope and collapse: Secondary | ICD-10-CM

## 2011-05-21 ENCOUNTER — Other Ambulatory Visit: Payer: Self-pay | Admitting: *Deleted

## 2011-05-21 DIAGNOSIS — I8290 Acute embolism and thrombosis of unspecified vein: Secondary | ICD-10-CM

## 2011-05-21 MED ORDER — WARFARIN SODIUM 5 MG PO TABS: 5.0000 mg | ORAL_TABLET | Freq: Every day | ORAL | Status: DC

## 2011-05-28 ENCOUNTER — Ambulatory Visit (INDEPENDENT_AMBULATORY_CARE_PROVIDER_SITE_OTHER): Payer: Medicare Other | Admitting: *Deleted

## 2011-05-28 ENCOUNTER — Telehealth: Payer: Self-pay | Admitting: *Deleted

## 2011-05-28 DIAGNOSIS — Z954 Presence of other heart-valve replacement: Secondary | ICD-10-CM

## 2011-05-28 DIAGNOSIS — Z7901 Long term (current) use of anticoagulants: Secondary | ICD-10-CM

## 2011-05-28 DIAGNOSIS — I059 Rheumatic mitral valve disease, unspecified: Secondary | ICD-10-CM

## 2011-05-28 LAB — POCT INR: INR: 5.9

## 2011-05-28 NOTE — Patient Instructions (Addendum)

## 2011-05-28 NOTE — Telephone Encounter (Signed)
Called stating he was in today and had INR checked. Wanted to know if he should continue taking ASA. Per Dr. [place] he is to stop ASA now.

## 2011-05-31 ENCOUNTER — Other Ambulatory Visit: Payer: Self-pay | Admitting: *Deleted

## 2011-05-31 MED ORDER — RANITIDINE HCL 150 MG PO TABS: 150.0000 mg | ORAL_TABLET | Freq: Two times a day (BID) | ORAL | Status: DC

## 2011-06-04 ENCOUNTER — Ambulatory Visit (INDEPENDENT_AMBULATORY_CARE_PROVIDER_SITE_OTHER): Payer: Medicare Other | Admitting: *Deleted

## 2011-06-04 DIAGNOSIS — Z7901 Long term (current) use of anticoagulants: Secondary | ICD-10-CM

## 2011-06-04 DIAGNOSIS — I341 Nonrheumatic mitral (valve) prolapse: Secondary | ICD-10-CM

## 2011-06-04 DIAGNOSIS — Z954 Presence of other heart-valve replacement: Secondary | ICD-10-CM

## 2011-06-04 DIAGNOSIS — I059 Rheumatic mitral valve disease, unspecified: Secondary | ICD-10-CM

## 2011-06-11 ENCOUNTER — Ambulatory Visit (INDEPENDENT_AMBULATORY_CARE_PROVIDER_SITE_OTHER): Payer: Medicare Other | Admitting: *Deleted

## 2011-06-11 DIAGNOSIS — I341 Nonrheumatic mitral (valve) prolapse: Secondary | ICD-10-CM

## 2011-06-11 DIAGNOSIS — Z7901 Long term (current) use of anticoagulants: Secondary | ICD-10-CM

## 2011-06-11 DIAGNOSIS — I059 Rheumatic mitral valve disease, unspecified: Secondary | ICD-10-CM

## 2011-06-11 DIAGNOSIS — Z954 Presence of other heart-valve replacement: Secondary | ICD-10-CM

## 2011-06-17 ENCOUNTER — Ambulatory Visit (INDEPENDENT_AMBULATORY_CARE_PROVIDER_SITE_OTHER): Payer: Medicare Other | Admitting: *Deleted

## 2011-06-17 ENCOUNTER — Ambulatory Visit (INDEPENDENT_AMBULATORY_CARE_PROVIDER_SITE_OTHER): Payer: Medicare Other | Admitting: Cardiology

## 2011-06-17 ENCOUNTER — Encounter: Payer: Self-pay | Admitting: Cardiology

## 2011-06-17 VITALS — BP 118/70 | HR 71 | Ht 68.0 in | Wt 176.0 lb

## 2011-06-17 DIAGNOSIS — Z9889 Other specified postprocedural states: Secondary | ICD-10-CM

## 2011-06-17 DIAGNOSIS — I059 Rheumatic mitral valve disease, unspecified: Secondary | ICD-10-CM

## 2011-06-17 DIAGNOSIS — I341 Nonrheumatic mitral (valve) prolapse: Secondary | ICD-10-CM

## 2011-06-17 DIAGNOSIS — Z954 Presence of other heart-valve replacement: Secondary | ICD-10-CM

## 2011-06-17 DIAGNOSIS — I5189 Other ill-defined heart diseases: Secondary | ICD-10-CM

## 2011-06-17 DIAGNOSIS — I513 Intracardiac thrombosis, not elsewhere classified: Secondary | ICD-10-CM

## 2011-06-17 DIAGNOSIS — I639 Cerebral infarction, unspecified: Secondary | ICD-10-CM

## 2011-06-17 DIAGNOSIS — Z7901 Long term (current) use of anticoagulants: Secondary | ICD-10-CM

## 2011-06-17 DIAGNOSIS — I2510 Atherosclerotic heart disease of native coronary artery without angina pectoris: Secondary | ICD-10-CM

## 2011-06-17 DIAGNOSIS — I6350 Cerebral infarction due to unspecified occlusion or stenosis of unspecified cerebral artery: Secondary | ICD-10-CM

## 2011-06-17 DIAGNOSIS — I4891 Unspecified atrial fibrillation: Secondary | ICD-10-CM

## 2011-06-17 MED ORDER — ROSUVASTATIN CALCIUM 20 MG PO TABS: 20.0000 mg | ORAL_TABLET | Freq: Every day | ORAL | Status: DC

## 2011-06-17 NOTE — Patient Instructions (Signed)
Continue your current medication.  I will see you again in 3 months with fasting lab work.

## 2011-06-19 ENCOUNTER — Other Ambulatory Visit: Payer: Self-pay | Admitting: Cardiology

## 2011-06-20 NOTE — Assessment & Plan Note (Signed)
Status post satisfactory repair.

## 2011-06-20 NOTE — Assessment & Plan Note (Signed)
Findings no mobile thrombus within the left atrial appendage. Although  this had been excluded surgically there is still concern that there is communication given the appearance of flow within the atrial appendage and we recommended long-term anticoagulation with Coumadin.

## 2011-06-20 NOTE — Progress Notes (Signed)
Woodfin Ganja Date of Birth: 07/10/1928   History of Present Illness: Mr. Goyer is seen for followup today. He has been doing very well from a cardiac standpoint. He denies any symptoms of chest pain, shortness of breath, or palpitations. He was admitted in December of this year with a stroke. A transesophageal echo at that time demonstrated a satisfactory mitral valve repair. The left atrium appeared normal. There was however a rounded density in the left atrial appendage that was mobile. It is noteworthy that the left atrial appendage was oversewn at the time of his previous surgery and while we cannot demonstrate any communication with the left atrium by TEE there was certainly concern that there was still communication given the appearance of the mobile thrombus within the atrial appendage and we recommended long-term anticoagulation.  Current Outpatient Prescriptions on File Prior to Visit  Medication Sig Dispense Refill  . amLODipine (NORVASC) 5 MG tablet Take 5 mg by mouth daily.        . benazepril (LOTENSIN) 40 MG tablet Take 1 tablet (40 mg total) by mouth daily.  90 tablet  3  . metoprolol tartrate (LOPRESSOR) 25 MG tablet Take 25 mg by mouth 2 (two) times daily.        . ranitidine (ZANTAC) 150 MG tablet Take 1 tablet (150 mg total) by mouth 2 (two) times daily.  60 tablet  5    Allergies  Allergen Reactions  . Lasix (Furosemide) Rash  . Penicillins Rash  . Sulfa Drugs Cross Reactors Rash    Past Medical History  Diagnosis Date  . Hyperlipidemia   . Hypertension   . Coronary artery disease     3-vessel coronary artery disease  . MVP (mitral valve prolapse)   . Campath-induced atrial fibrillation     remote history  . Chronic renal insufficiency   . History of recurrent TIAs   . Hypertensive vascular disease   . CVA (cerebrovascular accident)     RIGHT BRAIN  . Odynophagia   . Esophagitis     Distal esophagitis  . Nephrolithiasis   . Shortness of breath   .  Transudative pleural effusion     Past Surgical History  Procedure Date  . Cardiac catheterization 03/19/2004  . Maze 2005  . Coronary artery bypass graft 2005    LIMA GRAFT TO THE DISTAL LAD, SAPHENOUS VEIN GRAFT TO THE FIRST DIADGONAL BRANCH, SAPHENOUS VEIN GRAFT TO THE THIRD MARIGINAL BRANCH, AND SAPHENOUS VEIN GRAFT TO THE PDA  . Mitral valve repair   . Appendectomy   . Tee without cardioversion 05/19/2011    Procedure: TRANSESOPHAGEAL ECHOCARDIOGRAM (TEE);  Surgeon: Lashonne Shull [place], MD;  Location: [facility] ENDOSCOPY;  Service: Cardiovascular;  Laterality: N/A;    History  Smoking status  . Former Smoker  . Quit date: 06/15/1955  Smokeless tobacco  . Not on file    History  Alcohol Use No    Family History  Problem Relation Age of Onset  . Heart attack Mother   . Stroke Father   . Hypertension Father   . Kidney failure Father     Review of Systems: The review of systems is positive for recent CVA.  All other systems were reviewed and are negative.  Physical Exam: BP 118/70  Pulse 71  Ht 5\' 8"  (1.727 m)  Wt 176 lb (79.833 kg)  BMI 26.76 kg/m2 He is a pleasant elderly male who appears younger than his stated age. HEENT normocephalic, atraumatic. Pupils are equal round  and reactive to light and accommodation. Sclera clear. Oropharynx is clear. Neck is without JVD, adenopathy, thyromegaly, or bruits. Lungs are clear. Cardiac exam reveals a regular rate and rhythm without gallop, murmur, or click. Abdomen is soft and nontender without masses or bruits. His femoral and pedal pulses are 2+. He has no edema. He is alert and oriented x3 and cranial nerves II through XII are intact. LABORATORY DATA:   Assessment / Plan:

## 2011-06-20 NOTE — Assessment & Plan Note (Signed)
He has no recurrent anginal symptoms. We'll continue with his medical therapy and risk factor modification. Crestor was refilled today

## 2011-06-21 ENCOUNTER — Other Ambulatory Visit: Payer: Self-pay | Admitting: *Deleted

## 2011-06-21 DIAGNOSIS — I10 Essential (primary) hypertension: Secondary | ICD-10-CM

## 2011-06-21 MED ORDER — BENAZEPRIL HCL 40 MG PO TABS: 40.0000 mg | ORAL_TABLET | Freq: Every day | ORAL | Status: DC

## 2011-06-21 MED ORDER — AMLODIPINE BESYLATE 5 MG PO TABS: 5.0000 mg | ORAL_TABLET | Freq: Every day | ORAL | Status: DC

## 2011-06-25 ENCOUNTER — Ambulatory Visit (INDEPENDENT_AMBULATORY_CARE_PROVIDER_SITE_OTHER): Payer: Medicare Other | Admitting: *Deleted

## 2011-06-25 DIAGNOSIS — I341 Nonrheumatic mitral (valve) prolapse: Secondary | ICD-10-CM

## 2011-06-25 DIAGNOSIS — I059 Rheumatic mitral valve disease, unspecified: Secondary | ICD-10-CM

## 2011-06-25 DIAGNOSIS — Z954 Presence of other heart-valve replacement: Secondary | ICD-10-CM

## 2011-06-25 DIAGNOSIS — Z7901 Long term (current) use of anticoagulants: Secondary | ICD-10-CM

## 2011-07-02 ENCOUNTER — Ambulatory Visit (INDEPENDENT_AMBULATORY_CARE_PROVIDER_SITE_OTHER): Payer: Medicare Other | Admitting: *Deleted

## 2011-07-02 DIAGNOSIS — Z7901 Long term (current) use of anticoagulants: Secondary | ICD-10-CM

## 2011-07-02 DIAGNOSIS — I341 Nonrheumatic mitral (valve) prolapse: Secondary | ICD-10-CM

## 2011-07-02 DIAGNOSIS — Z954 Presence of other heart-valve replacement: Secondary | ICD-10-CM

## 2011-07-02 DIAGNOSIS — I059 Rheumatic mitral valve disease, unspecified: Secondary | ICD-10-CM

## 2011-07-16 ENCOUNTER — Ambulatory Visit (INDEPENDENT_AMBULATORY_CARE_PROVIDER_SITE_OTHER): Payer: Medicare Other

## 2011-07-16 DIAGNOSIS — I341 Nonrheumatic mitral (valve) prolapse: Secondary | ICD-10-CM

## 2011-07-16 DIAGNOSIS — I059 Rheumatic mitral valve disease, unspecified: Secondary | ICD-10-CM

## 2011-07-16 DIAGNOSIS — Z954 Presence of other heart-valve replacement: Secondary | ICD-10-CM

## 2011-07-16 DIAGNOSIS — Z7901 Long term (current) use of anticoagulants: Secondary | ICD-10-CM

## 2011-07-16 LAB — POCT INR: INR: 3.7

## 2011-07-30 ENCOUNTER — Ambulatory Visit (INDEPENDENT_AMBULATORY_CARE_PROVIDER_SITE_OTHER): Payer: Medicare Other | Admitting: *Deleted

## 2011-07-30 DIAGNOSIS — I059 Rheumatic mitral valve disease, unspecified: Secondary | ICD-10-CM

## 2011-07-30 DIAGNOSIS — Z7901 Long term (current) use of anticoagulants: Secondary | ICD-10-CM

## 2011-07-30 DIAGNOSIS — I341 Nonrheumatic mitral (valve) prolapse: Secondary | ICD-10-CM

## 2011-07-30 DIAGNOSIS — Z954 Presence of other heart-valve replacement: Secondary | ICD-10-CM

## 2011-07-30 LAB — POCT INR: INR: 3.6

## 2011-07-30 MED ORDER — WARFARIN SODIUM 2.5 MG PO TABS: 2.5000 mg | ORAL_TABLET | ORAL | Status: DC

## 2011-08-13 ENCOUNTER — Ambulatory Visit (INDEPENDENT_AMBULATORY_CARE_PROVIDER_SITE_OTHER): Payer: Medicare Other | Admitting: *Deleted

## 2011-08-13 DIAGNOSIS — I341 Nonrheumatic mitral (valve) prolapse: Secondary | ICD-10-CM

## 2011-08-13 DIAGNOSIS — Z7901 Long term (current) use of anticoagulants: Secondary | ICD-10-CM

## 2011-08-13 DIAGNOSIS — I059 Rheumatic mitral valve disease, unspecified: Secondary | ICD-10-CM

## 2011-08-13 DIAGNOSIS — Z954 Presence of other heart-valve replacement: Secondary | ICD-10-CM

## 2011-08-13 LAB — POCT INR: INR: 3.5

## 2011-09-09 ENCOUNTER — Ambulatory Visit (INDEPENDENT_AMBULATORY_CARE_PROVIDER_SITE_OTHER): Payer: Medicare Other | Admitting: *Deleted

## 2011-09-09 DIAGNOSIS — Z7901 Long term (current) use of anticoagulants: Secondary | ICD-10-CM

## 2011-09-09 DIAGNOSIS — Z954 Presence of other heart-valve replacement: Secondary | ICD-10-CM

## 2011-09-09 DIAGNOSIS — I059 Rheumatic mitral valve disease, unspecified: Secondary | ICD-10-CM

## 2011-09-09 DIAGNOSIS — I341 Nonrheumatic mitral (valve) prolapse: Secondary | ICD-10-CM

## 2011-09-09 LAB — POCT INR: INR: 2.6

## 2011-09-20 ENCOUNTER — Other Ambulatory Visit: Payer: Self-pay | Admitting: *Deleted

## 2011-10-04 ENCOUNTER — Ambulatory Visit (INDEPENDENT_AMBULATORY_CARE_PROVIDER_SITE_OTHER): Payer: Medicare Other | Admitting: Cardiology

## 2011-10-04 ENCOUNTER — Encounter: Payer: Self-pay | Admitting: Cardiology

## 2011-10-04 ENCOUNTER — Other Ambulatory Visit (INDEPENDENT_AMBULATORY_CARE_PROVIDER_SITE_OTHER): Payer: Medicare Other

## 2011-10-04 ENCOUNTER — Ambulatory Visit (INDEPENDENT_AMBULATORY_CARE_PROVIDER_SITE_OTHER): Payer: Medicare Other | Admitting: *Deleted

## 2011-10-04 VITALS — BP 142/78 | HR 78 | Ht 67.0 in | Wt 175.0 lb

## 2011-10-04 DIAGNOSIS — I5189 Other ill-defined heart diseases: Secondary | ICD-10-CM

## 2011-10-04 DIAGNOSIS — I2510 Atherosclerotic heart disease of native coronary artery without angina pectoris: Secondary | ICD-10-CM

## 2011-10-04 DIAGNOSIS — I6350 Cerebral infarction due to unspecified occlusion or stenosis of unspecified cerebral artery: Secondary | ICD-10-CM

## 2011-10-04 DIAGNOSIS — Z9889 Other specified postprocedural states: Secondary | ICD-10-CM

## 2011-10-04 DIAGNOSIS — I059 Rheumatic mitral valve disease, unspecified: Secondary | ICD-10-CM

## 2011-10-04 DIAGNOSIS — Z7901 Long term (current) use of anticoagulants: Secondary | ICD-10-CM

## 2011-10-04 DIAGNOSIS — I4891 Unspecified atrial fibrillation: Secondary | ICD-10-CM

## 2011-10-04 LAB — LIPID PANEL
Cholesterol: 156 mg/dL (ref 0–200)
HDL: 48.1 mg/dL (ref >39.00)
Triglycerides: 93 mg/dL (ref 0.0–149.0)
VLDL: 18.6 mg/dL (ref 0.0–40.0)

## 2011-10-04 LAB — BASIC METABOLIC PANEL
GFR: 55.82 mL/min — ABNORMAL LOW (ref >60.00)
Potassium: 4.6 mEq/L (ref 3.5–5.1)
Sodium: 139 mEq/L (ref 135–145)

## 2011-10-04 LAB — HEPATIC FUNCTION PANEL
AST: 26 U/L (ref 0–37)
Albumin: 4.5 g/dL (ref 3.5–5.2)

## 2011-10-04 LAB — POCT INR: INR: 3.4

## 2011-10-04 NOTE — Assessment & Plan Note (Addendum)
He denies any recurrent anginal symptoms. Continue with medical therapy including Coumadin and metoprolol. His last nuclear stress test in November 2000 he was low risk with a small fixed apical defect. Normal ejection fraction of 74%.

## 2011-10-04 NOTE — Patient Instructions (Signed)
Continue current therapy.  I will see you again in 6 months.

## 2011-10-04 NOTE — Assessment & Plan Note (Signed)
Status post mitral valve repair 2005 with satisfactory results.

## 2011-10-04 NOTE — Assessment & Plan Note (Signed)
History prior CVAs. Prior history of atrial fibrillation. Event monitor in December showed no recurrent atrial fibrillation but transesophageal echo did demonstrate left atrial appendage thrombus. I recommend long-term anticoagulation with target INR between 2 and 3.

## 2011-10-04 NOTE — Assessment & Plan Note (Signed)
No symptomatic recurrence on metoprolol.

## 2011-10-04 NOTE — Progress Notes (Signed)
Arthur [name] Date of Birth: [birth year]   History of Present Illness: Arthur [name] is seen for followup today. He has been doing very well from a cardiac standpoint. He denies any symptoms of chest pain, shortness of breath, or palpitations. He denies any recurrent TIA or CVA symptoms. He stays active. Blood pressure at home has been good. He has not yet taken his metoprolol today.   Current Outpatient Prescriptions on File Prior to Visit  Medication Sig Dispense Refill  . amLODipine (NORVASC) 5 MG tablet Take 1 tablet (5 mg total) by mouth daily.  90 tablet  3  . benazepril (LOTENSIN) 40 MG tablet Take 1 tablet (40 mg total) by mouth daily.  90 tablet  3  . metoprolol tartrate (LOPRESSOR) 25 MG tablet Take 25 mg by mouth 2 (two) times daily.        . ranitidine (ZANTAC) 150 MG tablet Take 1 tablet (150 mg total) by mouth 2 (two) times daily.  60 tablet  5  . rosuvastatin (CRESTOR) 20 MG tablet Take 1 tablet (20 mg total) by mouth daily.  90 tablet  3  . warfarin (COUMADIN) 2.5 MG tablet Take 1 tablet (2.5 mg total) by mouth as directed.  30 tablet  3  . DISCONTD: warfarin (COUMADIN) 5 MG tablet Take 1 tablet (5 mg total) by mouth daily.  30 tablet  11    Allergies  Allergen Reactions  . Lasix (Furosemide) Rash  . Penicillins Rash  . Sulfa Drugs Cross Reactors Rash    Past Medical History  Diagnosis Date  . Hyperlipidemia   . Hypertension   . Coronary artery disease     3-vessel coronary artery disease  . MVP (mitral valve prolapse)   . Atrial fibrillation     remote history  . Chronic renal insufficiency   . History of recurrent TIAs   . Hypertensive vascular disease   . CVA (cerebrovascular accident)     RIGHT BRAIN  . Odynophagia   . Esophagitis     Distal esophagitis  . Nephrolithiasis   . Shortness of breath   . Transudative pleural effusion     Past Surgical History  Procedure Date  . Cardiac catheterization 03/19/2004  . Maze 2005  . Coronary artery bypass  graft 2005    LIMA GRAFT TO THE DISTAL LAD, SAPHENOUS VEIN GRAFT TO THE FIRST DIADGONAL BRANCH, SAPHENOUS VEIN GRAFT TO THE THIRD MARIGINAL BRANCH, AND SAPHENOUS VEIN GRAFT TO THE PDA  . Mitral valve repair   . Appendectomy   . Tee without cardioversion 05/19/2011    Procedure: TRANSESOPHAGEAL ECHOCARDIOGRAM (TEE);  Surgeon: Jorryn Hershberger [place], MD;  Location: [facility] ENDOSCOPY;  Service: Cardiovascular;  Laterality: N/A;    History  Smoking status  . Former Smoker  . Quit date: 06/15/1955  Smokeless tobacco  . Not on file    History  Alcohol Use No    Family History  Problem Relation Age of Onset  . Heart attack Mother   . Stroke Father   . Hypertension Father   . Kidney failure Father     Review of Systems: As noted in history of present illness.  All other systems were reviewed and are negative.  Physical Exam: BP 142/78  Pulse 78  Ht 5\' 7"  (1.702 m)  Wt 175 lb (79.379 kg)  BMI 27.41 kg/m2 He is a pleasant elderly male in no apparent distress. HEENT normocephalic, atraumatic. Pupils are equal round and reactive to light and accommodation. Sclera clear. Oropharynx  is clear. Neck is without JVD, adenopathy, thyromegaly, or bruits. Lungs are clear. Cardiac exam reveals a regular rate and rhythm without gallop, murmur, or click. Abdomen is soft and nontender without masses or bruits. His femoral and pedal pulses are 2+. He has no edema. He is alert and oriented x3 and cranial nerves II through XII are intact. LABORATORY DATA: INR today is 3.4  Assessment / Plan:

## 2011-10-19 ENCOUNTER — Other Ambulatory Visit: Payer: Self-pay

## 2011-10-19 MED ORDER — METOPROLOL TARTRATE 25 MG PO TABS: 25.0000 mg | ORAL_TABLET | Freq: Two times a day (BID) | ORAL | Status: DC

## 2011-11-01 ENCOUNTER — Ambulatory Visit (INDEPENDENT_AMBULATORY_CARE_PROVIDER_SITE_OTHER): Payer: Medicare Other | Admitting: Pharmacist

## 2011-11-01 DIAGNOSIS — I059 Rheumatic mitral valve disease, unspecified: Secondary | ICD-10-CM

## 2011-11-01 DIAGNOSIS — Z7901 Long term (current) use of anticoagulants: Secondary | ICD-10-CM

## 2011-11-01 DIAGNOSIS — Z954 Presence of other heart-valve replacement: Secondary | ICD-10-CM

## 2011-11-01 DIAGNOSIS — I341 Nonrheumatic mitral (valve) prolapse: Secondary | ICD-10-CM

## 2011-11-01 LAB — POCT INR: INR: 2.9

## 2011-11-24 ENCOUNTER — Other Ambulatory Visit: Payer: Self-pay | Admitting: Cardiology

## 2011-11-29 ENCOUNTER — Ambulatory Visit (INDEPENDENT_AMBULATORY_CARE_PROVIDER_SITE_OTHER): Payer: Medicare Other | Admitting: *Deleted

## 2011-11-29 DIAGNOSIS — I059 Rheumatic mitral valve disease, unspecified: Secondary | ICD-10-CM

## 2011-11-29 DIAGNOSIS — I341 Nonrheumatic mitral (valve) prolapse: Secondary | ICD-10-CM

## 2011-11-29 DIAGNOSIS — Z7901 Long term (current) use of anticoagulants: Secondary | ICD-10-CM

## 2011-11-29 DIAGNOSIS — Z954 Presence of other heart-valve replacement: Secondary | ICD-10-CM

## 2011-12-06 ENCOUNTER — Other Ambulatory Visit: Payer: Self-pay | Admitting: Cardiology

## 2011-12-07 ENCOUNTER — Other Ambulatory Visit: Payer: Self-pay | Admitting: Cardiology

## 2011-12-13 ENCOUNTER — Other Ambulatory Visit: Payer: Self-pay | Admitting: Cardiology

## 2011-12-20 ENCOUNTER — Ambulatory Visit (INDEPENDENT_AMBULATORY_CARE_PROVIDER_SITE_OTHER): Payer: Medicare Other

## 2011-12-20 DIAGNOSIS — I059 Rheumatic mitral valve disease, unspecified: Secondary | ICD-10-CM

## 2011-12-20 DIAGNOSIS — I341 Nonrheumatic mitral (valve) prolapse: Secondary | ICD-10-CM

## 2011-12-20 DIAGNOSIS — Z954 Presence of other heart-valve replacement: Secondary | ICD-10-CM

## 2011-12-20 DIAGNOSIS — Z7901 Long term (current) use of anticoagulants: Secondary | ICD-10-CM

## 2011-12-20 LAB — POCT INR: INR: 1.8

## 2012-01-10 ENCOUNTER — Ambulatory Visit (INDEPENDENT_AMBULATORY_CARE_PROVIDER_SITE_OTHER): Payer: Medicare Other

## 2012-01-10 DIAGNOSIS — Z7901 Long term (current) use of anticoagulants: Secondary | ICD-10-CM

## 2012-01-10 DIAGNOSIS — I059 Rheumatic mitral valve disease, unspecified: Secondary | ICD-10-CM

## 2012-01-10 DIAGNOSIS — I341 Nonrheumatic mitral (valve) prolapse: Secondary | ICD-10-CM

## 2012-01-10 DIAGNOSIS — Z954 Presence of other heart-valve replacement: Secondary | ICD-10-CM

## 2012-01-10 LAB — POCT INR: INR: 2.9

## 2012-02-07 ENCOUNTER — Ambulatory Visit (INDEPENDENT_AMBULATORY_CARE_PROVIDER_SITE_OTHER): Payer: Medicare Other | Admitting: *Deleted

## 2012-02-07 DIAGNOSIS — I059 Rheumatic mitral valve disease, unspecified: Secondary | ICD-10-CM

## 2012-02-07 DIAGNOSIS — I341 Nonrheumatic mitral (valve) prolapse: Secondary | ICD-10-CM

## 2012-02-07 DIAGNOSIS — Z954 Presence of other heart-valve replacement: Secondary | ICD-10-CM

## 2012-02-07 DIAGNOSIS — Z7901 Long term (current) use of anticoagulants: Secondary | ICD-10-CM

## 2012-02-29 ENCOUNTER — Ambulatory Visit (INDEPENDENT_AMBULATORY_CARE_PROVIDER_SITE_OTHER): Payer: Medicare Other | Admitting: *Deleted

## 2012-02-29 DIAGNOSIS — Z954 Presence of other heart-valve replacement: Secondary | ICD-10-CM

## 2012-02-29 DIAGNOSIS — I341 Nonrheumatic mitral (valve) prolapse: Secondary | ICD-10-CM

## 2012-02-29 DIAGNOSIS — I059 Rheumatic mitral valve disease, unspecified: Secondary | ICD-10-CM

## 2012-02-29 DIAGNOSIS — Z7901 Long term (current) use of anticoagulants: Secondary | ICD-10-CM

## 2012-03-01 ENCOUNTER — Encounter: Payer: Self-pay | Admitting: *Deleted

## 2012-03-01 ENCOUNTER — Encounter: Payer: Self-pay | Admitting: Cardiology

## 2012-03-10 ENCOUNTER — Other Ambulatory Visit: Payer: Self-pay | Admitting: Cardiology

## 2012-03-17 ENCOUNTER — Other Ambulatory Visit: Payer: Self-pay | Admitting: Cardiology

## 2012-03-23 ENCOUNTER — Ambulatory Visit (INDEPENDENT_AMBULATORY_CARE_PROVIDER_SITE_OTHER): Payer: Medicare Other | Admitting: *Deleted

## 2012-03-23 ENCOUNTER — Other Ambulatory Visit: Payer: Self-pay | Admitting: Cardiology

## 2012-03-23 DIAGNOSIS — I059 Rheumatic mitral valve disease, unspecified: Secondary | ICD-10-CM

## 2012-03-23 DIAGNOSIS — Z7901 Long term (current) use of anticoagulants: Secondary | ICD-10-CM

## 2012-03-23 DIAGNOSIS — I341 Nonrheumatic mitral (valve) prolapse: Secondary | ICD-10-CM

## 2012-03-23 DIAGNOSIS — Z954 Presence of other heart-valve replacement: Secondary | ICD-10-CM

## 2012-03-24 NOTE — Telephone Encounter (Signed)
Fax Received. Refill Completed. Arthur [name] (R.M.A)   

## 2012-04-04 ENCOUNTER — Encounter: Payer: Self-pay | Admitting: Cardiology

## 2012-04-04 ENCOUNTER — Other Ambulatory Visit: Payer: Self-pay

## 2012-04-04 ENCOUNTER — Ambulatory Visit (INDEPENDENT_AMBULATORY_CARE_PROVIDER_SITE_OTHER): Payer: Medicare Other | Admitting: Cardiology

## 2012-04-04 VITALS — BP 132/82 | HR 53 | Ht 67.0 in | Wt 179.0 lb

## 2012-04-04 DIAGNOSIS — I6350 Cerebral infarction due to unspecified occlusion or stenosis of unspecified cerebral artery: Secondary | ICD-10-CM

## 2012-04-04 DIAGNOSIS — E785 Hyperlipidemia, unspecified: Secondary | ICD-10-CM

## 2012-04-04 DIAGNOSIS — I10 Essential (primary) hypertension: Secondary | ICD-10-CM

## 2012-04-04 DIAGNOSIS — I639 Cerebral infarction, unspecified: Secondary | ICD-10-CM

## 2012-04-04 DIAGNOSIS — I4891 Unspecified atrial fibrillation: Secondary | ICD-10-CM

## 2012-04-04 DIAGNOSIS — I2510 Atherosclerotic heart disease of native coronary artery without angina pectoris: Secondary | ICD-10-CM

## 2012-04-04 NOTE — Progress Notes (Signed)
Arthur [name] Date of Birth: 09-Jun-1929   History of Present Illness: Arthur [name] is seen for followup today. He has been doing very well from a cardiac standpoint. He denies any symptoms of chest pain, shortness of breath, or palpitations. He denies any recurrent TIA or CVA symptoms. He has had no bleeding problems on Coumadin. He was recently treated for an upper respiratory infection with antibiotics. His INR recently was 2.4.   Current Outpatient Prescriptions on File Prior to Visit  Medication Sig Dispense Refill  . amLODipine (NORVASC) 5 MG tablet TAKE 1 TABLET BY MOUTH DAILY  90 tablet  2  . benazepril (LOTENSIN) 40 MG tablet Take 1 tablet (40 mg total) by mouth daily.  90 tablet  3  . metoprolol tartrate (LOPRESSOR) 25 MG tablet TAKE 1 TABLET (25 MG TOTAL) BY MOUTH 2 (TWO) TIMES DAILY WITH FOOD.  60 tablet  5  . ranitidine (ZANTAC) 150 MG tablet TAKE 1 TABLET (150 MG TOTAL) BY MOUTH 2 (TWO) TIMES DAILY.  60 tablet  4  . rosuvastatin (CRESTOR) 20 MG tablet Take 1 tablet (20 mg total) by mouth daily.  90 tablet  3  . warfarin (COUMADIN) 2.5 MG tablet TAKE 1 TABLET (2.5 MG TOTAL) BY MOUTH AS DIRECTED.  30 tablet  3  . warfarin (COUMADIN) 2.5 MG tablet TAKE AS DIRECTED BY COUMADIN CLINIC  30 tablet  3  . DISCONTD: warfarin (COUMADIN) 5 MG tablet Take 1 tablet (5 mg total) by mouth daily.  30 tablet  11    Allergies  Allergen Reactions  . Lasix (Furosemide) Rash  . Penicillins Rash  . Sulfa Drugs Cross Reactors Rash    Past Medical History  Diagnosis Date  . Hyperlipidemia   . Hypertension   . Coronary artery disease     3-vessel coronary artery disease  . MVP (mitral valve prolapse)   . Atrial fibrillation     remote history  . Chronic renal insufficiency   . History of recurrent TIAs   . Hypertensive vascular disease   . CVA (cerebrovascular accident)     RIGHT BRAIN  . Odynophagia   . Esophagitis     Distal esophagitis  . Nephrolithiasis   . Shortness of breath     . Transudative pleural effusion     Past Surgical History  Procedure Date  . Cardiac catheterization 03/19/2004  . Maze 2005  . Coronary artery bypass graft 2005    LIMA GRAFT TO THE DISTAL LAD, SAPHENOUS VEIN GRAFT TO THE FIRST DIADGONAL BRANCH, SAPHENOUS VEIN GRAFT TO THE THIRD MARIGINAL BRANCH, AND SAPHENOUS VEIN GRAFT TO THE PDA  . Mitral valve repair   . Appendectomy   . Tee without cardioversion 05/19/2011    Procedure: TRANSESOPHAGEAL ECHOCARDIOGRAM (TEE);  Surgeon: Rondarius Kadrmas [place], MD;  Location: [facility] ENDOSCOPY;  Service: Cardiovascular;  Laterality: N/A;    History  Smoking status  . Former Smoker  . Quit date: 06/15/1955  Smokeless tobacco  . Not on file    History  Alcohol Use No    Family History  Problem Relation Age of Onset  . Heart attack Mother   . Stroke Father   . Hypertension Father   . Kidney failure Father     Review of Systems: As noted in history of present illness.  All other systems were reviewed and are negative.  Physical Exam: BP 132/82  Pulse 53  Ht 5\' 7"  (1.702 m)  Wt 179 lb (81.194 kg)  BMI 28.04 kg/m2  SpO2 92% He is a pleasant elderly male in no apparent distress. HEENT normocephalic, atraumatic. Pupils are equal round and reactive to light and accommodation. Sclera clear. Oropharynx is clear. Neck is without JVD, adenopathy, thyromegaly, or bruits. Lungs are clear. Cardiac exam reveals a regular rate and rhythm without gallop, murmur, or click. Abdomen is soft and nontender without masses or bruits. His femoral and pedal pulses are 2+. He has no edema. He is alert and oriented x3 and cranial nerves II through XII are intact. LABORATORY DATA: Recent INR was 2.4. ECG today demonstrates normal sinus rhythm with old septal infarction.  Assessment / Plan: 1. History of CVA. TEE documented a left atrial appendage thrombus. He is on chronic anticoagulation.  2. Coronary disease status post CABG in 2005. He also had a Maze procedure at that  time. The left atrial appendage was ligated but TEE last December showed evidence of a mobile thrombus. Patient has no significant anginal symptoms. We will continue his current medical therapy.   3. Atrial fibrillation-status post Maze procedure. He is in sinus rhythm.  4. Hypercholesterolemia. On chronic Crestor. We will followup again in 6 months and check fasting lab work at that time.

## 2012-04-04 NOTE — Patient Instructions (Signed)
Continue your current medication  I will see you in 6 months with fasting lab work.   

## 2012-04-06 ENCOUNTER — Other Ambulatory Visit: Payer: Self-pay | Admitting: Cardiology

## 2012-04-14 ENCOUNTER — Ambulatory Visit (INDEPENDENT_AMBULATORY_CARE_PROVIDER_SITE_OTHER): Payer: Medicare Other | Admitting: *Deleted

## 2012-04-14 DIAGNOSIS — Z7901 Long term (current) use of anticoagulants: Secondary | ICD-10-CM

## 2012-04-14 DIAGNOSIS — I341 Nonrheumatic mitral (valve) prolapse: Secondary | ICD-10-CM

## 2012-04-14 DIAGNOSIS — I059 Rheumatic mitral valve disease, unspecified: Secondary | ICD-10-CM

## 2012-04-14 DIAGNOSIS — Z954 Presence of other heart-valve replacement: Secondary | ICD-10-CM

## 2012-04-16 ENCOUNTER — Other Ambulatory Visit: Payer: Self-pay | Admitting: Cardiology

## 2012-05-09 ENCOUNTER — Ambulatory Visit (INDEPENDENT_AMBULATORY_CARE_PROVIDER_SITE_OTHER): Payer: Medicare Other | Admitting: *Deleted

## 2012-05-09 DIAGNOSIS — I059 Rheumatic mitral valve disease, unspecified: Secondary | ICD-10-CM

## 2012-05-09 DIAGNOSIS — Z954 Presence of other heart-valve replacement: Secondary | ICD-10-CM

## 2012-05-09 DIAGNOSIS — Z7901 Long term (current) use of anticoagulants: Secondary | ICD-10-CM

## 2012-05-09 DIAGNOSIS — I341 Nonrheumatic mitral (valve) prolapse: Secondary | ICD-10-CM

## 2012-05-09 LAB — POCT INR: INR: 2.8

## 2012-05-22 ENCOUNTER — Other Ambulatory Visit: Payer: Self-pay

## 2012-06-16 ENCOUNTER — Other Ambulatory Visit: Payer: Self-pay

## 2012-06-16 MED ORDER — AMLODIPINE BESYLATE 5 MG PO TABS: 5.0000 mg | ORAL_TABLET | Freq: Every day | ORAL | Status: DC

## 2012-06-20 ENCOUNTER — Ambulatory Visit (INDEPENDENT_AMBULATORY_CARE_PROVIDER_SITE_OTHER): Payer: Medicare Other | Admitting: *Deleted

## 2012-06-20 DIAGNOSIS — Z954 Presence of other heart-valve replacement: Secondary | ICD-10-CM

## 2012-06-20 DIAGNOSIS — I341 Nonrheumatic mitral (valve) prolapse: Secondary | ICD-10-CM

## 2012-06-20 DIAGNOSIS — Z7901 Long term (current) use of anticoagulants: Secondary | ICD-10-CM

## 2012-06-20 DIAGNOSIS — I059 Rheumatic mitral valve disease, unspecified: Secondary | ICD-10-CM

## 2012-06-27 ENCOUNTER — Other Ambulatory Visit: Payer: Self-pay | Admitting: *Deleted

## 2012-06-27 DIAGNOSIS — I10 Essential (primary) hypertension: Secondary | ICD-10-CM

## 2012-06-27 MED ORDER — BENAZEPRIL HCL 40 MG PO TABS: 40.0000 mg | ORAL_TABLET | Freq: Every day | ORAL | Status: DC

## 2012-07-31 ENCOUNTER — Ambulatory Visit (INDEPENDENT_AMBULATORY_CARE_PROVIDER_SITE_OTHER): Payer: Medicare Other

## 2012-07-31 ENCOUNTER — Telehealth: Payer: Self-pay | Admitting: Internal Medicine

## 2012-07-31 DIAGNOSIS — I341 Nonrheumatic mitral (valve) prolapse: Secondary | ICD-10-CM

## 2012-07-31 DIAGNOSIS — Z954 Presence of other heart-valve replacement: Secondary | ICD-10-CM

## 2012-07-31 DIAGNOSIS — I059 Rheumatic mitral valve disease, unspecified: Secondary | ICD-10-CM

## 2012-07-31 DIAGNOSIS — Z7901 Long term (current) use of anticoagulants: Secondary | ICD-10-CM

## 2012-07-31 LAB — POCT INR: INR: 3.8

## 2012-08-05 ENCOUNTER — Other Ambulatory Visit: Payer: Self-pay | Admitting: Cardiology

## 2012-08-07 ENCOUNTER — Other Ambulatory Visit: Payer: Self-pay

## 2012-08-07 MED ORDER — WARFARIN SODIUM 2.5 MG PO TABS: ORAL_TABLET | ORAL | Status: DC

## 2012-08-21 ENCOUNTER — Ambulatory Visit (INDEPENDENT_AMBULATORY_CARE_PROVIDER_SITE_OTHER): Payer: Medicare Other | Admitting: *Deleted

## 2012-08-21 DIAGNOSIS — Z954 Presence of other heart-valve replacement: Secondary | ICD-10-CM

## 2012-08-21 DIAGNOSIS — I059 Rheumatic mitral valve disease, unspecified: Secondary | ICD-10-CM

## 2012-08-21 DIAGNOSIS — Z7901 Long term (current) use of anticoagulants: Secondary | ICD-10-CM

## 2012-08-21 DIAGNOSIS — I341 Nonrheumatic mitral (valve) prolapse: Secondary | ICD-10-CM

## 2012-08-21 LAB — POCT INR: INR: 3.1

## 2012-09-09 ENCOUNTER — Other Ambulatory Visit: Payer: Self-pay | Admitting: Cardiology

## 2012-09-14 ENCOUNTER — Ambulatory Visit (INDEPENDENT_AMBULATORY_CARE_PROVIDER_SITE_OTHER): Payer: Medicare Other | Admitting: *Deleted

## 2012-09-14 DIAGNOSIS — I341 Nonrheumatic mitral (valve) prolapse: Secondary | ICD-10-CM

## 2012-09-14 DIAGNOSIS — Z7901 Long term (current) use of anticoagulants: Secondary | ICD-10-CM

## 2012-09-14 DIAGNOSIS — Z954 Presence of other heart-valve replacement: Secondary | ICD-10-CM

## 2012-09-14 DIAGNOSIS — I059 Rheumatic mitral valve disease, unspecified: Secondary | ICD-10-CM

## 2012-09-25 ENCOUNTER — Other Ambulatory Visit (INDEPENDENT_AMBULATORY_CARE_PROVIDER_SITE_OTHER): Payer: Medicare Other

## 2012-09-25 DIAGNOSIS — E785 Hyperlipidemia, unspecified: Secondary | ICD-10-CM

## 2012-09-25 LAB — BASIC METABOLIC PANEL
BUN: 25 mg/dL — ABNORMAL HIGH (ref 6–23)
Chloride: 103 mEq/L (ref 96–112)
Creatinine, Ser: 1.4 mg/dL (ref 0.4–1.5)
Glucose, Bld: 91 mg/dL (ref 70–99)

## 2012-09-25 LAB — HEPATIC FUNCTION PANEL
Alkaline Phosphatase: 47 U/L (ref 39–117)
Bilirubin, Direct: 0.1 mg/dL (ref 0.0–0.3)
Total Bilirubin: 1.1 mg/dL (ref 0.3–1.2)
Total Protein: 7.3 g/dL (ref 6.0–8.3)

## 2012-09-25 LAB — LIPID PANEL
Cholesterol: 165 mg/dL (ref 0–200)
LDL Cholesterol: 103 mg/dL — ABNORMAL HIGH (ref 0–99)

## 2012-09-28 ENCOUNTER — Encounter: Payer: Self-pay | Admitting: Cardiology

## 2012-09-28 ENCOUNTER — Ambulatory Visit (INDEPENDENT_AMBULATORY_CARE_PROVIDER_SITE_OTHER): Payer: Medicare Other | Admitting: Cardiology

## 2012-09-28 VITALS — BP 124/80 | HR 64 | Ht 67.0 in | Wt 175.0 lb

## 2012-09-28 DIAGNOSIS — I059 Rheumatic mitral valve disease, unspecified: Secondary | ICD-10-CM

## 2012-09-28 DIAGNOSIS — Z7901 Long term (current) use of anticoagulants: Secondary | ICD-10-CM

## 2012-09-28 DIAGNOSIS — I4891 Unspecified atrial fibrillation: Secondary | ICD-10-CM

## 2012-09-28 DIAGNOSIS — I341 Nonrheumatic mitral (valve) prolapse: Secondary | ICD-10-CM

## 2012-09-28 DIAGNOSIS — I2510 Atherosclerotic heart disease of native coronary artery without angina pectoris: Secondary | ICD-10-CM

## 2012-09-28 NOTE — Patient Instructions (Signed)
Continue your current therapy  I will see you in 6 months.   

## 2012-09-28 NOTE — Progress Notes (Signed)
Arthur [name] Date of Birth: Feb 15, 1929   History of Present Illness: Arthur [name] is seen for followup today. He has been doing very well from a cardiac standpoint. He denies any symptoms of chest pain, shortness of breath, or palpitations. He denies any recurrent TIA or CVA symptoms. His INRs have been therapeutic. He is turning 84 next week.  Current Outpatient Prescriptions on File Prior to Visit  Medication Sig Dispense Refill  . amLODipine (NORVASC) 5 MG tablet Take 1 tablet (5 mg total) by mouth daily.  90 tablet  2  . benazepril (LOTENSIN) 40 MG tablet Take 1 tablet (40 mg total) by mouth daily.  90 tablet  3  . CRESTOR 20 MG tablet TAKE 1 TABLET (20 MG TOTAL) BY MOUTH DAILY.  30 tablet  6  . metoprolol tartrate (LOPRESSOR) 25 MG tablet TAKE 1 TABLET (25 MG TOTAL) BY MOUTH 2 (TWO) TIMES DAILY WITH FOOD.  60 tablet  5  . ranitidine (ZANTAC) 150 MG tablet TAKE 1 TABLET (150 MG TOTAL) BY MOUTH 2 (TWO) TIMES DAILY.  60 tablet  3  . warfarin (COUMADIN) 2.5 MG tablet TAKE AS DIRECTED BY COUMADIN CLINIC  30 tablet  3  . warfarin (COUMADIN) 2.5 MG tablet Take as directed by anticoagulation clinic  30 tablet  3   No current facility-administered medications on file prior to visit.    Allergies  Allergen Reactions  . Lasix (Furosemide) Rash  . Penicillins Rash  . Sulfa Drugs Cross Reactors Rash    Past Medical History  Diagnosis Date  . Hyperlipidemia   . Hypertension   . Coronary artery disease     3-vessel coronary artery disease  . MVP (mitral valve prolapse)   . Atrial fibrillation     remote history  . Chronic renal insufficiency   . History of recurrent TIAs   . Hypertensive vascular disease   . CVA (cerebrovascular accident)     RIGHT BRAIN  . Odynophagia   . Esophagitis     Distal esophagitis  . Nephrolithiasis   . Shortness of breath   . Transudative pleural effusion     Past Surgical History  Procedure Laterality Date  . Cardiac catheterization  03/19/2004   . Maze  2005  . Coronary artery bypass graft  2005    LIMA GRAFT TO THE DISTAL LAD, SAPHENOUS VEIN GRAFT TO THE FIRST DIADGONAL BRANCH, SAPHENOUS VEIN GRAFT TO THE THIRD MARIGINAL BRANCH, AND SAPHENOUS VEIN GRAFT TO THE PDA  . Mitral valve repair    . Appendectomy    . Tee without cardioversion  05/19/2011    Procedure: TRANSESOPHAGEAL ECHOCARDIOGRAM (TEE);  Surgeon: Sheketa Ende [place], MD;  Location: [facility] ENDOSCOPY;  Service: Cardiovascular;  Laterality: N/A;    History  Smoking status  . Former Smoker  . Quit date: 06/15/1955  Smokeless tobacco  . Not on file    History  Alcohol Use No    Family History  Problem Relation Age of Onset  . Heart attack Mother   . Stroke Father   . Hypertension Father   . Kidney failure Father     Review of Systems: As noted in history of present illness.  All other systems were reviewed and are negative.  Physical Exam: BP 124/80  Pulse 64  Ht 5\' 7"  (1.702 m)  Wt 175 lb (79.379 kg)  BMI 27.4 kg/m2  SpO2 97% He is a pleasant elderly male in no apparent distress. HEENT normocephalic, atraumatic. Pupils are equal round and  reactive to light and accommodation. Sclera clear. Oropharynx is clear. Neck is without JVD, adenopathy, thyromegaly, or bruits. Lungs are clear. Cardiac exam reveals a regular rate and rhythm without gallop, murmur, or click. Abdomen is soft and nontender without masses or bruits. His femoral and pedal pulses are 2+. He has no edema. He is alert and oriented x3 and cranial nerves II through XII are intact. LABORATORY DATA: Recent INR was 2.4. Lab Results  Component Value Date   WBC 10.4 05/14/2011   HGB 15.4 05/14/2011   HCT 45.8 05/14/2011   PLT 229 05/14/2011   GLUCOSE 91 09/25/2012   CHOL 165 09/25/2012   TRIG 109.0 09/25/2012   HDL 40.40 09/25/2012   LDLCALC 103* 09/25/2012   ALT 25 09/25/2012   AST 27 09/25/2012   NA 139 09/25/2012   K 3.8 09/25/2012   CL 103 09/25/2012   CREATININE 1.4 09/25/2012   BUN 25* 09/25/2012    CO2 24 09/25/2012   TSH 0.293* 05/14/2011   INR 2.4 09/14/2012   HGBA1C 5.6 05/14/2011     Assessment / Plan: 1. History of CVA. TEE documented a left atrial appendage thrombus despite prior ligation of the atrial appendage. He is on chronic anticoagulation.  2. Coronary disease status post CABG in 2005. He also had a Maze procedure at that time. The left atrial appendage was ligated but TEE last December showed evidence of a mobile thrombus. Patient has no significant anginal symptoms. We will continue his current medical therapy.   3. Atrial fibrillation-status post Maze procedure. He is in sinus rhythm.  4. Hypercholesterolemia. On chronic Crestor. Recent laboratory data was reviewed.

## 2012-10-12 ENCOUNTER — Ambulatory Visit (INDEPENDENT_AMBULATORY_CARE_PROVIDER_SITE_OTHER): Payer: Medicare Other | Admitting: *Deleted

## 2012-10-12 DIAGNOSIS — I341 Nonrheumatic mitral (valve) prolapse: Secondary | ICD-10-CM

## 2012-10-12 DIAGNOSIS — Z954 Presence of other heart-valve replacement: Secondary | ICD-10-CM

## 2012-10-12 DIAGNOSIS — I059 Rheumatic mitral valve disease, unspecified: Secondary | ICD-10-CM

## 2012-10-12 DIAGNOSIS — Z7901 Long term (current) use of anticoagulants: Secondary | ICD-10-CM

## 2012-11-09 ENCOUNTER — Ambulatory Visit (INDEPENDENT_AMBULATORY_CARE_PROVIDER_SITE_OTHER): Payer: Medicare Other | Admitting: *Deleted

## 2012-11-09 DIAGNOSIS — Z7901 Long term (current) use of anticoagulants: Secondary | ICD-10-CM

## 2012-11-09 DIAGNOSIS — I059 Rheumatic mitral valve disease, unspecified: Secondary | ICD-10-CM

## 2012-11-09 DIAGNOSIS — Z954 Presence of other heart-valve replacement: Secondary | ICD-10-CM

## 2012-11-09 DIAGNOSIS — I341 Nonrheumatic mitral (valve) prolapse: Secondary | ICD-10-CM

## 2012-11-11 ENCOUNTER — Other Ambulatory Visit: Payer: Self-pay | Admitting: Cardiology

## 2012-11-12 ENCOUNTER — Other Ambulatory Visit: Payer: Self-pay | Admitting: Cardiology

## 2012-12-07 ENCOUNTER — Other Ambulatory Visit: Payer: Self-pay | Admitting: Cardiology

## 2012-12-21 ENCOUNTER — Ambulatory Visit (INDEPENDENT_AMBULATORY_CARE_PROVIDER_SITE_OTHER): Payer: Medicare Other | Admitting: *Deleted

## 2012-12-21 DIAGNOSIS — I059 Rheumatic mitral valve disease, unspecified: Secondary | ICD-10-CM

## 2012-12-21 DIAGNOSIS — I341 Nonrheumatic mitral (valve) prolapse: Secondary | ICD-10-CM

## 2012-12-21 DIAGNOSIS — Z954 Presence of other heart-valve replacement: Secondary | ICD-10-CM

## 2012-12-21 DIAGNOSIS — Z7901 Long term (current) use of anticoagulants: Secondary | ICD-10-CM

## 2012-12-21 LAB — POCT INR: INR: 2.2

## 2013-01-06 ENCOUNTER — Other Ambulatory Visit: Payer: Self-pay | Admitting: Cardiology

## 2013-01-28 ENCOUNTER — Other Ambulatory Visit: Payer: Self-pay | Admitting: Cardiology

## 2013-01-31 ENCOUNTER — Other Ambulatory Visit: Payer: Self-pay | Admitting: Cardiology

## 2013-02-01 ENCOUNTER — Ambulatory Visit (INDEPENDENT_AMBULATORY_CARE_PROVIDER_SITE_OTHER): Payer: Medicare Other | Admitting: Pharmacist

## 2013-02-01 DIAGNOSIS — I341 Nonrheumatic mitral (valve) prolapse: Secondary | ICD-10-CM

## 2013-02-01 DIAGNOSIS — I059 Rheumatic mitral valve disease, unspecified: Secondary | ICD-10-CM

## 2013-02-01 DIAGNOSIS — Z7901 Long term (current) use of anticoagulants: Secondary | ICD-10-CM

## 2013-02-01 DIAGNOSIS — Z954 Presence of other heart-valve replacement: Secondary | ICD-10-CM

## 2013-02-01 LAB — POCT INR: INR: 3

## 2013-03-08 ENCOUNTER — Other Ambulatory Visit: Payer: Self-pay | Admitting: Cardiology

## 2013-03-15 ENCOUNTER — Ambulatory Visit (INDEPENDENT_AMBULATORY_CARE_PROVIDER_SITE_OTHER): Payer: Medicare Other | Admitting: *Deleted

## 2013-03-15 DIAGNOSIS — Z7901 Long term (current) use of anticoagulants: Secondary | ICD-10-CM

## 2013-03-15 DIAGNOSIS — I059 Rheumatic mitral valve disease, unspecified: Secondary | ICD-10-CM

## 2013-03-15 DIAGNOSIS — I341 Nonrheumatic mitral (valve) prolapse: Secondary | ICD-10-CM

## 2013-03-15 DIAGNOSIS — Z954 Presence of other heart-valve replacement: Secondary | ICD-10-CM

## 2013-03-20 ENCOUNTER — Other Ambulatory Visit: Payer: Self-pay | Admitting: Cardiology

## 2013-03-28 ENCOUNTER — Ambulatory Visit: Payer: Medicare Other | Admitting: Cardiology

## 2013-04-11 ENCOUNTER — Other Ambulatory Visit: Payer: Self-pay | Admitting: Cardiology

## 2013-04-22 ENCOUNTER — Other Ambulatory Visit: Payer: Self-pay | Admitting: Cardiology

## 2013-04-26 ENCOUNTER — Ambulatory Visit (INDEPENDENT_AMBULATORY_CARE_PROVIDER_SITE_OTHER): Payer: Medicare Other | Admitting: *Deleted

## 2013-04-26 ENCOUNTER — Encounter: Payer: Self-pay | Admitting: Cardiology

## 2013-04-26 ENCOUNTER — Ambulatory Visit (INDEPENDENT_AMBULATORY_CARE_PROVIDER_SITE_OTHER): Payer: Medicare Other | Admitting: Cardiology

## 2013-04-26 VITALS — BP 148/72 | HR 66 | Ht 67.0 in | Wt 177.8 lb

## 2013-04-26 DIAGNOSIS — I341 Nonrheumatic mitral (valve) prolapse: Secondary | ICD-10-CM

## 2013-04-26 DIAGNOSIS — I059 Rheumatic mitral valve disease, unspecified: Secondary | ICD-10-CM

## 2013-04-26 DIAGNOSIS — Z7901 Long term (current) use of anticoagulants: Secondary | ICD-10-CM

## 2013-04-26 DIAGNOSIS — I4891 Unspecified atrial fibrillation: Secondary | ICD-10-CM

## 2013-04-26 DIAGNOSIS — I2510 Atherosclerotic heart disease of native coronary artery without angina pectoris: Secondary | ICD-10-CM

## 2013-04-26 DIAGNOSIS — I10 Essential (primary) hypertension: Secondary | ICD-10-CM

## 2013-04-26 DIAGNOSIS — Z954 Presence of other heart-valve replacement: Secondary | ICD-10-CM

## 2013-04-26 DIAGNOSIS — Z0000 Encounter for general adult medical examination without abnormal findings: Secondary | ICD-10-CM

## 2013-04-26 NOTE — Progress Notes (Signed)
Arthur [name] Date of Birth: [birth year]   History of Present Illness: Arthur [name] is seen for followup today. He has a history of coronary disease and is status post CABG in 2005. He had mitral valve repair and a Maze procedure at that time. He subsequently developed recurrent CVA and TEE demonstrated persistent left atrial appendage thrombus despite a history of previous ligation of the appendage. He has been on chronic anticoagulation. He has been doing very well from a cardiac standpoint. He denies any symptoms of chest pain, shortness of breath, or palpitations. He denies any recurrent TIA or CVA symptoms. His INRs have been therapeutic.   Current Outpatient Prescriptions on File Prior to Visit  Medication Sig Dispense Refill  . amLODipine (NORVASC) 5 MG tablet TAKE 1 TABLET (5 MG TOTAL) BY MOUTH DAILY.  30 tablet  0  . benazepril (LOTENSIN) 40 MG tablet Take 1 tablet (40 mg total) by mouth daily.  90 tablet  3  . CRESTOR 20 MG tablet TAKE 1 TABLET (20 MG TOTAL) BY MOUTH DAILY.  30 tablet  6  . metoprolol tartrate (LOPRESSOR) 25 MG tablet TAKE 1 TABLET (25 MG TOTAL) BY MOUTH 2 (TWO) TIMES DAILY WITH FOOD.  60 tablet  6  . ranitidine (ZANTAC) 150 MG tablet TAKE 1 TABLET (150 MG TOTAL) BY MOUTH 2 (TWO) TIMES DAILY.  60 tablet  6  . warfarin (COUMADIN) 2.5 MG tablet TAKE AS DIRECTED BY ANTICOAGULATION CLINIC  30 tablet  3   No current facility-administered medications on file prior to visit.    Allergies  Allergen Reactions  . Lasix [Furosemide] Rash  . Penicillins Rash  . Sulfa Drugs Cross Reactors Rash    Past Medical History  Diagnosis Date  . Hyperlipidemia   . Hypertension   . Coronary artery disease     3-vessel coronary artery disease  . MVP (mitral valve prolapse)   . Atrial fibrillation     remote history  . Chronic renal insufficiency   . History of recurrent TIAs   . Hypertensive vascular disease   . CVA (cerebrovascular accident)     RIGHT BRAIN  . Odynophagia    . Esophagitis     Distal esophagitis  . Nephrolithiasis   . Shortness of breath   . Transudative pleural effusion     Past Surgical History  Procedure Laterality Date  . Cardiac catheterization  03/19/2004  . Maze  2005  . Coronary artery bypass graft  2005    LIMA GRAFT TO THE DISTAL LAD, SAPHENOUS VEIN GRAFT TO THE FIRST DIADGONAL BRANCH, SAPHENOUS VEIN GRAFT TO THE THIRD MARIGINAL BRANCH, AND SAPHENOUS VEIN GRAFT TO THE PDA  . Mitral valve repair    . Appendectomy    . Tee without cardioversion  05/19/2011    Procedure: TRANSESOPHAGEAL ECHOCARDIOGRAM (TEE);  Surgeon: Peter [place], MD;  Location: [facility] ENDOSCOPY;  Service: Cardiovascular;  Laterality: N/A;    History  Smoking status  . Former Smoker  . Quit date: 06/15/1955  Smokeless tobacco  . Not on file    History  Alcohol Use No    Family History  Problem Relation Age of Onset  . Heart attack Mother   . Stroke Father   . Hypertension Father   . Kidney failure Father     Review of Systems: As noted in history of present illness.  All other systems were reviewed and are negative.  Physical Exam: BP 148/72  Pulse 66  Ht 5\' 7"  (1.702 m)  Wt 177 lb 12.8 oz (80.65 kg)  BMI 27.84 kg/m2 He is a pleasant elderly male in no apparent distress. HEENT normocephalic, atraumatic. Pupils are equal round and reactive to light and accommodation. Sclera clear. Oropharynx is clear. Neck is without JVD, adenopathy, thyromegaly, or bruits. Lungs are clear. Cardiac exam reveals a regular rate and rhythm without gallop, murmur, or click. Abdomen is soft and nontender without masses or bruits. His femoral and pedal pulses are 2+. He has no edema. He is alert and oriented x3 and cranial nerves II through XII are intact. LABORATORY DATA: Recent INR was 3.3. Lab Results  Component Value Date   WBC 10.4 05/14/2011   HGB 15.4 05/14/2011   HCT 45.8 05/14/2011   PLT 229 05/14/2011   GLUCOSE 91 09/25/2012   CHOL 165 09/25/2012   TRIG  109.0 09/25/2012   HDL 40.40 09/25/2012   LDLCALC 103* 09/25/2012   ALT 25 09/25/2012   AST 27 09/25/2012   NA 139 09/25/2012   K 3.8 09/25/2012   CL 103 09/25/2012   CREATININE 1.4 09/25/2012   BUN 25* 09/25/2012   CO2 24 09/25/2012   TSH 0.293* 05/14/2011   INR 3.3 04/26/2013   HGBA1C 5.6 05/14/2011   ECG today demonstrates normal sinus rhythm with a rate of 66 beats per minute. There is evidence of an old septal infarction.  Assessment / Plan: 1. History of CVA. TEE documented a left atrial appendage thrombus despite prior ligation of the atrial appendage. He is on chronic anticoagulation.  2. Coronary disease status post CABG in 2005. He also had a Maze procedure at that time. The left atrial appendage was ligated but TEE last December showed evidence of a mobile thrombus. Patient has no significant anginal symptoms. We will continue his current medical therapy.   3. Atrial fibrillation-status post Maze procedure. He is in sinus rhythm.  4. Hypercholesterolemia. On chronic Crestor. Recent laboratory data was reviewed.

## 2013-05-31 ENCOUNTER — Ambulatory Visit (INDEPENDENT_AMBULATORY_CARE_PROVIDER_SITE_OTHER): Payer: Medicare Other | Admitting: *Deleted

## 2013-05-31 DIAGNOSIS — I059 Rheumatic mitral valve disease, unspecified: Secondary | ICD-10-CM

## 2013-05-31 DIAGNOSIS — Z954 Presence of other heart-valve replacement: Secondary | ICD-10-CM

## 2013-05-31 DIAGNOSIS — Z7901 Long term (current) use of anticoagulants: Secondary | ICD-10-CM

## 2013-05-31 DIAGNOSIS — I341 Nonrheumatic mitral (valve) prolapse: Secondary | ICD-10-CM

## 2013-05-31 LAB — POCT INR: INR: 3.3

## 2013-06-02 ENCOUNTER — Other Ambulatory Visit: Payer: Self-pay | Admitting: Cardiology

## 2013-06-21 ENCOUNTER — Ambulatory Visit (INDEPENDENT_AMBULATORY_CARE_PROVIDER_SITE_OTHER): Payer: Medicare Other | Admitting: *Deleted

## 2013-06-21 DIAGNOSIS — I059 Rheumatic mitral valve disease, unspecified: Secondary | ICD-10-CM

## 2013-06-21 DIAGNOSIS — Z7901 Long term (current) use of anticoagulants: Secondary | ICD-10-CM

## 2013-06-21 DIAGNOSIS — I341 Nonrheumatic mitral (valve) prolapse: Secondary | ICD-10-CM

## 2013-06-21 DIAGNOSIS — Z954 Presence of other heart-valve replacement: Secondary | ICD-10-CM

## 2013-06-21 LAB — POCT INR: INR: 2.8

## 2013-06-29 ENCOUNTER — Other Ambulatory Visit: Payer: Self-pay | Admitting: Cardiology

## 2013-07-01 ENCOUNTER — Other Ambulatory Visit: Payer: Self-pay | Admitting: Cardiology

## 2013-07-02 ENCOUNTER — Other Ambulatory Visit: Payer: Self-pay | Admitting: Cardiology

## 2013-07-19 ENCOUNTER — Ambulatory Visit (INDEPENDENT_AMBULATORY_CARE_PROVIDER_SITE_OTHER): Payer: Medicare Other | Admitting: Pharmacist

## 2013-07-19 DIAGNOSIS — I341 Nonrheumatic mitral (valve) prolapse: Secondary | ICD-10-CM

## 2013-07-19 DIAGNOSIS — Z7901 Long term (current) use of anticoagulants: Secondary | ICD-10-CM

## 2013-07-19 DIAGNOSIS — Z954 Presence of other heart-valve replacement: Secondary | ICD-10-CM

## 2013-07-19 DIAGNOSIS — I059 Rheumatic mitral valve disease, unspecified: Secondary | ICD-10-CM

## 2013-07-19 DIAGNOSIS — Z5181 Encounter for therapeutic drug level monitoring: Secondary | ICD-10-CM

## 2013-07-19 LAB — POCT INR: INR: 3.2

## 2013-08-16 ENCOUNTER — Ambulatory Visit (INDEPENDENT_AMBULATORY_CARE_PROVIDER_SITE_OTHER): Payer: Medicare Other

## 2013-08-16 DIAGNOSIS — Z5181 Encounter for therapeutic drug level monitoring: Secondary | ICD-10-CM

## 2013-08-16 DIAGNOSIS — I341 Nonrheumatic mitral (valve) prolapse: Secondary | ICD-10-CM

## 2013-08-16 DIAGNOSIS — I059 Rheumatic mitral valve disease, unspecified: Secondary | ICD-10-CM

## 2013-08-16 DIAGNOSIS — Z7901 Long term (current) use of anticoagulants: Secondary | ICD-10-CM

## 2013-08-16 DIAGNOSIS — Z954 Presence of other heart-valve replacement: Secondary | ICD-10-CM

## 2013-08-16 LAB — POCT INR: INR: 2.9

## 2013-09-13 ENCOUNTER — Ambulatory Visit (INDEPENDENT_AMBULATORY_CARE_PROVIDER_SITE_OTHER): Payer: Medicare Other

## 2013-09-13 DIAGNOSIS — I341 Nonrheumatic mitral (valve) prolapse: Secondary | ICD-10-CM

## 2013-09-13 DIAGNOSIS — Z7901 Long term (current) use of anticoagulants: Secondary | ICD-10-CM

## 2013-09-13 DIAGNOSIS — Z954 Presence of other heart-valve replacement: Secondary | ICD-10-CM

## 2013-09-13 DIAGNOSIS — Z5181 Encounter for therapeutic drug level monitoring: Secondary | ICD-10-CM

## 2013-09-13 DIAGNOSIS — I059 Rheumatic mitral valve disease, unspecified: Secondary | ICD-10-CM

## 2013-09-13 LAB — POCT INR: INR: 3.3

## 2013-10-04 ENCOUNTER — Ambulatory Visit (INDEPENDENT_AMBULATORY_CARE_PROVIDER_SITE_OTHER): Payer: Medicare Other | Admitting: *Deleted

## 2013-10-04 DIAGNOSIS — Z5181 Encounter for therapeutic drug level monitoring: Secondary | ICD-10-CM

## 2013-10-04 DIAGNOSIS — I341 Nonrheumatic mitral (valve) prolapse: Secondary | ICD-10-CM

## 2013-10-04 DIAGNOSIS — I059 Rheumatic mitral valve disease, unspecified: Secondary | ICD-10-CM

## 2013-10-04 DIAGNOSIS — Z7901 Long term (current) use of anticoagulants: Secondary | ICD-10-CM

## 2013-10-04 DIAGNOSIS — Z954 Presence of other heart-valve replacement: Secondary | ICD-10-CM

## 2013-10-04 LAB — POCT INR: INR: 2.1

## 2013-10-23 ENCOUNTER — Other Ambulatory Visit: Payer: Self-pay

## 2013-10-23 DIAGNOSIS — I10 Essential (primary) hypertension: Secondary | ICD-10-CM

## 2013-10-23 DIAGNOSIS — E785 Hyperlipidemia, unspecified: Secondary | ICD-10-CM

## 2013-10-23 DIAGNOSIS — I2510 Atherosclerotic heart disease of native coronary artery without angina pectoris: Secondary | ICD-10-CM

## 2013-10-25 ENCOUNTER — Ambulatory Visit: Payer: Medicare Other | Admitting: Cardiology

## 2013-11-01 ENCOUNTER — Other Ambulatory Visit (INDEPENDENT_AMBULATORY_CARE_PROVIDER_SITE_OTHER): Payer: Medicare Other

## 2013-11-01 ENCOUNTER — Ambulatory Visit (INDEPENDENT_AMBULATORY_CARE_PROVIDER_SITE_OTHER): Payer: Medicare Other | Admitting: Pharmacist Clinician (PhC)/ Clinical Pharmacy Specialist

## 2013-11-01 DIAGNOSIS — I2510 Atherosclerotic heart disease of native coronary artery without angina pectoris: Secondary | ICD-10-CM

## 2013-11-01 DIAGNOSIS — Z5181 Encounter for therapeutic drug level monitoring: Secondary | ICD-10-CM

## 2013-11-01 DIAGNOSIS — Z7901 Long term (current) use of anticoagulants: Secondary | ICD-10-CM

## 2013-11-01 DIAGNOSIS — E785 Hyperlipidemia, unspecified: Secondary | ICD-10-CM

## 2013-11-01 DIAGNOSIS — I10 Essential (primary) hypertension: Secondary | ICD-10-CM

## 2013-11-01 DIAGNOSIS — Z954 Presence of other heart-valve replacement: Secondary | ICD-10-CM

## 2013-11-01 DIAGNOSIS — I059 Rheumatic mitral valve disease, unspecified: Secondary | ICD-10-CM

## 2013-11-01 DIAGNOSIS — I341 Nonrheumatic mitral (valve) prolapse: Secondary | ICD-10-CM

## 2013-11-01 LAB — POCT INR: INR: 2.2

## 2013-11-01 LAB — HEPATIC FUNCTION PANEL
ALK PHOS: 56 U/L (ref 39–117)
ALT: 25 U/L (ref 0–53)
AST: 25 U/L (ref 0–37)
Albumin: 4.2 g/dL (ref 3.5–5.2)
BILIRUBIN DIRECT: 0.1 mg/dL (ref 0.0–0.3)
TOTAL PROTEIN: 7.1 g/dL (ref 6.0–8.3)
Total Bilirubin: 1 mg/dL (ref 0.2–1.2)

## 2013-11-01 LAB — LIPID PANEL
CHOLESTEROL: 167 mg/dL (ref 0–200)
HDL: 55.5 mg/dL (ref >39.00)
LDL Cholesterol: 94 mg/dL (ref 0–99)
Total CHOL/HDL Ratio: 3
Triglycerides: 87 mg/dL (ref 0.0–149.0)
VLDL: 17.4 mg/dL (ref 0.0–40.0)

## 2013-11-01 LAB — BASIC METABOLIC PANEL
BUN: 21 mg/dL (ref 6–23)
CHLORIDE: 103 meq/L (ref 96–112)
CO2: 28 mEq/L (ref 19–32)
CREATININE: 1.2 mg/dL (ref 0.4–1.5)
Calcium: 9.5 mg/dL (ref 8.4–10.5)
GFR: 62.65 mL/min (ref >60.00)
GLUCOSE: 80 mg/dL (ref 70–99)
Potassium: 3.9 mEq/L (ref 3.5–5.1)
Sodium: 138 mEq/L (ref 135–145)

## 2013-11-06 ENCOUNTER — Ambulatory Visit: Payer: Medicare Other | Admitting: Cardiology

## 2013-11-11 ENCOUNTER — Other Ambulatory Visit: Payer: Self-pay | Admitting: Cardiology

## 2013-11-23 ENCOUNTER — Other Ambulatory Visit: Payer: Self-pay | Admitting: Cardiology

## 2013-11-26 ENCOUNTER — Encounter: Payer: Self-pay | Admitting: Cardiology

## 2013-11-26 ENCOUNTER — Ambulatory Visit (INDEPENDENT_AMBULATORY_CARE_PROVIDER_SITE_OTHER): Payer: Medicare Other

## 2013-11-26 ENCOUNTER — Ambulatory Visit (INDEPENDENT_AMBULATORY_CARE_PROVIDER_SITE_OTHER): Payer: Medicare Other | Admitting: Cardiology

## 2013-11-26 VITALS — BP 132/79 | HR 84 | Ht 67.0 in | Wt 171.6 lb

## 2013-11-26 DIAGNOSIS — N289 Disorder of kidney and ureter, unspecified: Secondary | ICD-10-CM

## 2013-11-26 DIAGNOSIS — I059 Rheumatic mitral valve disease, unspecified: Secondary | ICD-10-CM

## 2013-11-26 DIAGNOSIS — I2510 Atherosclerotic heart disease of native coronary artery without angina pectoris: Secondary | ICD-10-CM

## 2013-11-26 DIAGNOSIS — I341 Nonrheumatic mitral (valve) prolapse: Secondary | ICD-10-CM

## 2013-11-26 DIAGNOSIS — Z954 Presence of other heart-valve replacement: Secondary | ICD-10-CM

## 2013-11-26 DIAGNOSIS — Z5181 Encounter for therapeutic drug level monitoring: Secondary | ICD-10-CM

## 2013-11-26 DIAGNOSIS — I10 Essential (primary) hypertension: Secondary | ICD-10-CM

## 2013-11-26 DIAGNOSIS — I4891 Unspecified atrial fibrillation: Secondary | ICD-10-CM

## 2013-11-26 DIAGNOSIS — Z7901 Long term (current) use of anticoagulants: Secondary | ICD-10-CM

## 2013-11-26 DIAGNOSIS — N189 Chronic kidney disease, unspecified: Secondary | ICD-10-CM

## 2013-11-26 LAB — POCT INR: INR: 2.7

## 2013-11-26 MED ORDER — WARFARIN SODIUM 2.5 MG PO TABS
ORAL_TABLET | ORAL | Status: DC
Start: 2013-11-26 — End: 2013-11-26

## 2013-11-26 MED ORDER — WARFARIN SODIUM 2.5 MG PO TABS: ORAL_TABLET | ORAL | Status: DC

## 2013-11-26 NOTE — Patient Instructions (Signed)
Continue your current therapy  I will see you in 6 months.   

## 2013-11-26 NOTE — Progress Notes (Signed)
[name] Date of Birth: 12/07/1928   History of Present Illness: [name] is seen for followup today. He has a history of coronary disease and is status post CABG in 2005. He had mitral valve repair and a Maze procedure at that time. He subsequently developed recurrent CVA and TEE demonstrated persistent left atrial appendage thrombus despite a history of previous ligation of the appendage. He has been on chronic anticoagulation. He has been doing very well from a cardiac standpoint. He denies any symptoms of chest pain, shortness of breath, or palpitations. He denies any recurrent TIA or CVA symptoms. His INRs have been therapeutic. He is watching his diet more closely and has lost 6 lbs. He did have a fall earlier this year and bruised a rib.   Current Outpatient Prescriptions on File Prior to Visit  Medication Sig Dispense Refill  . amLODipine (NORVASC) 5 MG tablet TAKE 1 TABLET (5 MG TOTAL) BY MOUTH DAILY.  90 tablet  2  . benazepril (LOTENSIN) 40 MG tablet TAKE 1 TABLET (40 MG TOTAL) BY MOUTH DAILY.  90 tablet  2  . CRESTOR 20 MG tablet TAKE 1 TABLET (20 MG TOTAL) BY MOUTH DAILY.  30 tablet  5  . metoprolol tartrate (LOPRESSOR) 25 MG tablet TAKE 1 TABLET (25 MG TOTAL) BY MOUTH 2 (TWO) TIMES DAILY WITH FOOD.  60 tablet  5  . ranitidine (ZANTAC) 150 MG tablet TAKE 1 TABLET (150 MG TOTAL) BY MOUTH 2 (TWO) TIMES DAILY.  60 tablet  5   No current facility-administered medications on file prior to visit.    Allergies  Allergen Reactions  . Lasix [Furosemide] Rash  . Penicillins Rash  . Sulfa Drugs Cross Reactors Rash    Past Medical History  Diagnosis Date  . Hyperlipidemia   . Hypertension   . Coronary artery disease     3-vessel coronary artery disease  . MVP (mitral valve prolapse)   . Atrial fibrillation     remote history  . Chronic renal insufficiency   . History of recurrent TIAs   . Hypertensive vascular disease   . CVA (cerebrovascular accident)     RIGHT BRAIN   . Odynophagia   . Esophagitis     Distal esophagitis  . Nephrolithiasis   . Shortness of breath   . Transudative pleural effusion     Past Surgical History  Procedure Laterality Date  . Cardiac catheterization  03/19/2004  . Maze  2005  . Coronary artery bypass graft  2005    LIMA GRAFT TO THE DISTAL LAD, SAPHENOUS VEIN GRAFT TO THE FIRST DIADGONAL BRANCH, SAPHENOUS VEIN GRAFT TO THE THIRD MARIGINAL BRANCH, AND SAPHENOUS VEIN GRAFT TO THE PDA  . Mitral valve repair    . Appendectomy    . Tee without cardioversion  05/19/2011    Procedure: TRANSESOPHAGEAL ECHOCARDIOGRAM (TEE);  Surgeon: Peter Martinique, MD;  Location: [facility] ENDOSCOPY;  Service: Cardiovascular;  Laterality: N/A;    History  Smoking status  . Former Smoker  . Quit date: 06/15/1955  Smokeless tobacco  . Not on file    History  Alcohol Use No    Family History  Problem Relation Age of Onset  . Heart attack Mother   . Stroke Father   . Hypertension Father   . Kidney failure Father     Review of Systems: As noted in history of present illness.  All other systems were reviewed and are negative.  Physical Exam: BP 132/79  Pulse 84  Ht 5\' 7"  (1.702 m)  Wt 171 lb 9.6 oz (77.837 kg)  BMI 26.87 kg/m2 He is a pleasant elderly male in no apparent distress. HEENT normocephalic, atraumatic. Pupils are equal round and reactive to light and accommodation. Sclera clear. Oropharynx is clear. Neck is without JVD, adenopathy, thyromegaly, or bruits. Lungs are clear. Cardiac exam reveals a regular rate and rhythm without gallop, murmur, or click. Abdomen is soft and nontender without masses or bruits. His femoral and pedal pulses are 2+. He has no edema. He is alert and oriented x3 and cranial nerves II through XII are intact. LABORATORY DATA:  INR was 2.7   Lab Results  Component Value Date   WBC 10.4 05/14/2011   HGB 15.4 05/14/2011   HCT 45.8 05/14/2011   PLT 229 05/14/2011   GLUCOSE 80 11/01/2013   CHOL 167  11/01/2013   TRIG 87.0 11/01/2013   HDL 55.50 11/01/2013   LDLCALC 94 11/01/2013   ALT 25 11/01/2013   AST 25 11/01/2013   NA 138 11/01/2013   K 3.9 11/01/2013   CL 103 11/01/2013   CREATININE 1.2 11/01/2013   BUN 21 11/01/2013   CO2 28 11/01/2013   TSH 0.293* 05/14/2011   INR 2.7 11/26/2013   HGBA1C 5.6 05/14/2011     Assessment / Plan: 1. History of CVA. TEE documented a left atrial appendage thrombus despite prior ligation of the atrial appendage. He is on chronic anticoagulation.  2. Coronary disease status post CABG in 2005. He also had a Maze procedure at that time.  Patient has no significant anginal symptoms. We will continue his current medical therapy.   3. Atrial fibrillation-status post Maze procedure. He is in sinus rhythm.  4. Hypercholesterolemia. On chronic Crestor.

## 2013-12-24 ENCOUNTER — Ambulatory Visit (INDEPENDENT_AMBULATORY_CARE_PROVIDER_SITE_OTHER): Payer: Medicare Other | Admitting: Pharmacist

## 2013-12-24 DIAGNOSIS — Z7901 Long term (current) use of anticoagulants: Secondary | ICD-10-CM

## 2013-12-24 DIAGNOSIS — I341 Nonrheumatic mitral (valve) prolapse: Secondary | ICD-10-CM

## 2013-12-24 DIAGNOSIS — Z954 Presence of other heart-valve replacement: Secondary | ICD-10-CM

## 2013-12-24 DIAGNOSIS — I059 Rheumatic mitral valve disease, unspecified: Secondary | ICD-10-CM

## 2013-12-24 DIAGNOSIS — Z5181 Encounter for therapeutic drug level monitoring: Secondary | ICD-10-CM

## 2013-12-24 LAB — POCT INR: INR: 2.7

## 2014-02-04 ENCOUNTER — Ambulatory Visit: Payer: Medicare Other | Admitting: Pharmacist Clinician (PhC)/ Clinical Pharmacy Specialist

## 2014-02-06 ENCOUNTER — Ambulatory Visit (INDEPENDENT_AMBULATORY_CARE_PROVIDER_SITE_OTHER): Payer: Medicare Other | Admitting: Pharmacist Clinician (PhC)/ Clinical Pharmacy Specialist

## 2014-02-06 DIAGNOSIS — Z7901 Long term (current) use of anticoagulants: Secondary | ICD-10-CM

## 2014-02-06 DIAGNOSIS — Z954 Presence of other heart-valve replacement: Secondary | ICD-10-CM

## 2014-02-06 DIAGNOSIS — I341 Nonrheumatic mitral (valve) prolapse: Secondary | ICD-10-CM

## 2014-02-06 DIAGNOSIS — I059 Rheumatic mitral valve disease, unspecified: Secondary | ICD-10-CM

## 2014-02-06 DIAGNOSIS — Z5181 Encounter for therapeutic drug level monitoring: Secondary | ICD-10-CM

## 2014-02-06 LAB — POCT INR: INR: 2.8

## 2014-03-20 ENCOUNTER — Ambulatory Visit (INDEPENDENT_AMBULATORY_CARE_PROVIDER_SITE_OTHER): Payer: Medicare Other | Admitting: Pharmacist Clinician (PhC)/ Clinical Pharmacy Specialist

## 2014-03-20 DIAGNOSIS — Z5181 Encounter for therapeutic drug level monitoring: Secondary | ICD-10-CM

## 2014-03-20 DIAGNOSIS — Z7901 Long term (current) use of anticoagulants: Secondary | ICD-10-CM

## 2014-03-20 DIAGNOSIS — Z952 Presence of prosthetic heart valve: Secondary | ICD-10-CM

## 2014-03-20 DIAGNOSIS — I341 Nonrheumatic mitral (valve) prolapse: Secondary | ICD-10-CM

## 2014-03-20 DIAGNOSIS — Z954 Presence of other heart-valve replacement: Secondary | ICD-10-CM

## 2014-03-20 LAB — POCT INR: INR: 2.1

## 2014-03-28 ENCOUNTER — Other Ambulatory Visit: Payer: Self-pay | Admitting: Cardiology

## 2014-03-29 NOTE — Telephone Encounter (Signed)
Nothing was typed/tmj 

## 2014-04-29 ENCOUNTER — Ambulatory Visit (INDEPENDENT_AMBULATORY_CARE_PROVIDER_SITE_OTHER): Payer: Medicare Other | Admitting: Pharmacist Clinician (PhC)/ Clinical Pharmacy Specialist

## 2014-04-29 DIAGNOSIS — Z5181 Encounter for therapeutic drug level monitoring: Secondary | ICD-10-CM

## 2014-04-29 DIAGNOSIS — I341 Nonrheumatic mitral (valve) prolapse: Secondary | ICD-10-CM

## 2014-04-29 LAB — POCT INR: INR: 3

## 2014-05-08 ENCOUNTER — Other Ambulatory Visit: Payer: Self-pay | Admitting: Cardiology

## 2014-05-10 ENCOUNTER — Other Ambulatory Visit: Payer: Self-pay

## 2014-06-10 ENCOUNTER — Ambulatory Visit (INDEPENDENT_AMBULATORY_CARE_PROVIDER_SITE_OTHER): Payer: Medicare Other | Admitting: Pharmacist Clinician (PhC)/ Clinical Pharmacy Specialist

## 2014-06-10 DIAGNOSIS — Z7901 Long term (current) use of anticoagulants: Secondary | ICD-10-CM

## 2014-06-10 DIAGNOSIS — Z5181 Encounter for therapeutic drug level monitoring: Secondary | ICD-10-CM

## 2014-06-10 DIAGNOSIS — Z952 Presence of prosthetic heart valve: Secondary | ICD-10-CM

## 2014-06-10 DIAGNOSIS — Z954 Presence of other heart-valve replacement: Secondary | ICD-10-CM

## 2014-06-10 DIAGNOSIS — I341 Nonrheumatic mitral (valve) prolapse: Secondary | ICD-10-CM

## 2014-06-10 LAB — POCT INR: INR: 2.7

## 2014-06-17 ENCOUNTER — Encounter: Payer: Self-pay | Admitting: Cardiology

## 2014-06-17 ENCOUNTER — Ambulatory Visit (INDEPENDENT_AMBULATORY_CARE_PROVIDER_SITE_OTHER): Payer: Medicare Other | Admitting: Cardiology

## 2014-06-17 ENCOUNTER — Other Ambulatory Visit: Payer: Medicare Other

## 2014-06-17 ENCOUNTER — Other Ambulatory Visit (INDEPENDENT_AMBULATORY_CARE_PROVIDER_SITE_OTHER): Payer: Medicare Other | Admitting: *Deleted

## 2014-06-17 VITALS — BP 142/90 | HR 64 | Ht 66.0 in | Wt 171.5 lb

## 2014-06-17 DIAGNOSIS — I10 Essential (primary) hypertension: Secondary | ICD-10-CM

## 2014-06-17 DIAGNOSIS — E785 Hyperlipidemia, unspecified: Secondary | ICD-10-CM

## 2014-06-17 DIAGNOSIS — I25810 Atherosclerosis of coronary artery bypass graft(s) without angina pectoris: Secondary | ICD-10-CM

## 2014-06-17 DIAGNOSIS — Z7901 Long term (current) use of anticoagulants: Secondary | ICD-10-CM

## 2014-06-17 DIAGNOSIS — I480 Paroxysmal atrial fibrillation: Secondary | ICD-10-CM

## 2014-06-17 LAB — LIPID PANEL
Cholesterol: 158 mg/dL (ref 0–200)
HDL: 43.2 mg/dL (ref >39.00)
LDL CALC: 94 mg/dL (ref 0–99)
NonHDL: 114.8
Total CHOL/HDL Ratio: 4
Triglycerides: 103 mg/dL (ref 0.0–149.0)
VLDL: 20.6 mg/dL (ref 0.0–40.0)

## 2014-06-17 LAB — HEPATIC FUNCTION PANEL
ALT: 21 U/L (ref 0–53)
AST: 28 U/L (ref 0–37)
Albumin: 4.3 g/dL (ref 3.5–5.2)
Alkaline Phosphatase: 47 U/L (ref 39–117)
Bilirubin, Direct: 0.1 mg/dL (ref 0.0–0.3)
Total Bilirubin: 1.1 mg/dL (ref 0.2–1.2)
Total Protein: 6.8 g/dL (ref 6.0–8.3)

## 2014-06-17 LAB — BASIC METABOLIC PANEL
BUN: 20 mg/dL (ref 6–23)
CALCIUM: 9.3 mg/dL (ref 8.4–10.5)
CO2: 26 meq/L (ref 19–32)
Chloride: 105 mEq/L (ref 96–112)
Creatinine, Ser: 1.3 mg/dL (ref 0.4–1.5)
GFR: 54.96 mL/min — ABNORMAL LOW (ref >60.00)
GLUCOSE: 99 mg/dL (ref 70–99)
Potassium: 4.4 mEq/L (ref 3.5–5.1)
Sodium: 138 mEq/L (ref 135–145)

## 2014-06-17 NOTE — Progress Notes (Signed)
Arthur [name] Date of Birth: 05-08-1929   History of Present Illness: [name] is seen for followup of CAD. He has a history of coronary disease and is status post CABG in 2005. He had mitral valve repair and a Maze procedure at that time. He subsequently developed recurrent CVA and TEE demonstrated persistent left atrial appendage thrombus despite a history of previous ligation of the appendage. He has been on chronic anticoagulation. He has been doing very well from a cardiac standpoint. He denies any symptoms of chest pain, shortness of breath, or palpitations. He denies any recurrent TIA or CVA symptoms. His INRs have been therapeutic.    Current Outpatient Prescriptions on File Prior to Visit  Medication Sig Dispense Refill  . amLODipine (NORVASC) 5 MG tablet TAKE 1 TABLET (5 MG TOTAL) BY MOUTH DAILY. 90 tablet 0  . benazepril (LOTENSIN) 40 MG tablet TAKE 1 TABLET (40 MG TOTAL) BY MOUTH DAILY. 90 tablet 2  . CRESTOR 20 MG tablet TAKE 1 TABLET (20 MG TOTAL) BY MOUTH DAILY. 30 tablet 2  . ketoconazole (NIZORAL) 2 % cream Apply as needed    . metoprolol tartrate (LOPRESSOR) 25 MG tablet TAKE 1 TABLET (25 MG TOTAL) BY MOUTH 2 (TWO) TIMES DAILY WITH FOOD. 60 tablet 2  . ranitidine (ZANTAC) 150 MG tablet TAKE 1 TABLET (150 MG TOTAL) BY MOUTH 2 (TWO) TIMES DAILY. 60 tablet 2  . warfarin (COUMADIN) 2.5 MG tablet [name] as directed by anticoagulation clinic 30 tablet 11   No current facility-administered medications on file prior to visit.    Allergies  Allergen Reactions  . Lasix [Furosemide] Rash  . Penicillins Rash  . Sulfa Drugs Cross Reactors Rash    Past Medical History  Diagnosis Date  . Hyperlipidemia   . Hypertension   . Coronary artery disease     3-vessel coronary artery disease  . MVP (mitral valve prolapse)   . Atrial fibrillation     remote history  . Chronic renal insufficiency   . History of recurrent TIAs   . Hypertensive vascular disease   . CVA  (cerebrovascular accident)     RIGHT BRAIN  . Odynophagia   . Esophagitis     Distal esophagitis  . Nephrolithiasis   . Shortness of breath   . Transudative pleural effusion     Past Surgical History  Procedure Laterality Date  . Cardiac catheterization  03/19/2004  . Maze  2005  . Coronary artery bypass graft  2005    LIMA GRAFT TO THE DISTAL LAD, SAPHENOUS VEIN GRAFT TO THE FIRST DIADGONAL BRANCH, SAPHENOUS VEIN GRAFT TO THE THIRD MARIGINAL BRANCH, AND SAPHENOUS VEIN GRAFT TO THE PDA  . Mitral valve repair    . Appendectomy    . Tee without cardioversion  05/19/2011    Procedure: TRANSESOPHAGEAL ECHOCARDIOGRAM (TEE);  Surgeon: Shayanne Gomm Martinique, MD;  Location: [facility] ENDOSCOPY;  Service: Cardiovascular;  Laterality: N/A;    History  Smoking status  . Former Smoker  . Quit date: 06/15/1955  Smokeless tobacco  . Not on file    History  Alcohol Use No    Family History  Problem Relation Age of Onset  . Heart attack Mother   . Stroke Father   . Hypertension Father   . Kidney failure Father     Review of Systems: As noted in history of present illness.  All other systems were reviewed and are negative.  Physical Exam: BP 142/90 mmHg  Pulse 64  Ht 5'  6" (1.676 m)  Wt 171 lb 8 oz (77.792 kg)  BMI 27.69 kg/m2 He is a pleasant elderly male in no apparent distress. HEENT is normal.  Neck is without JVD, adenopathy, thyromegaly, or bruits. Lungs are clear. Cardiac exam reveals a regular rate and rhythm without gallop, murmur, or click. Normal S1-2. No gallop or murmur. Abdomen is soft and nontender without masses or bruits. His femoral and pedal pulses are 2+. He has no edema. He is alert and oriented x3 and cranial nerves II through XII are intact.  LABORATORY DATA:  INR was 2.7   Lab Results  Component Value Date   WBC 10.4 05/14/2011   HGB 15.4 05/14/2011   HCT 45.8 05/14/2011   PLT 229 05/14/2011   GLUCOSE 99 06/17/2014   CHOL 158 06/17/2014   TRIG 103.0 06/17/2014    HDL 43.20 06/17/2014   LDLCALC 94 06/17/2014   ALT 21 06/17/2014   AST 28 06/17/2014   NA 138 06/17/2014   K 4.4 06/17/2014   CL 105 06/17/2014   CREATININE 1.3 06/17/2014   BUN 20 06/17/2014   CO2 26 06/17/2014   TSH 0.293* 05/14/2011   INR 2.7 06/10/2014   HGBA1C 5.6 05/14/2011   Ecg today: NSR with first degree AV block. Rate 64 bpm. Old septal MI. Low voltage. I have personally reviewed and interpreted this study.   Assessment / Plan: 1. History of CVA. TEE documented a left atrial appendage thrombus despite prior ligation of the atrial appendage. He is on chronic anticoagulation and is therapeutic.   2. Coronary disease status post CABG in 2005. He also had a Maze procedure at that time.  Patient has no significant anginal symptoms. We will continue his current medical therapy.   3. Atrial fibrillation-status post Maze procedure. He is in sinus rhythm.  4. Hypercholesterolemia. On chronic Crestor. Will check fasting labs today.  I will follow up in 6 months.

## 2014-06-17 NOTE — Patient Instructions (Signed)
We will check lab work today  Continue your current therapy  I will see you in 6 months.   

## 2014-06-27 ENCOUNTER — Other Ambulatory Visit: Payer: Self-pay | Admitting: Cardiology

## 2014-07-04 ENCOUNTER — Other Ambulatory Visit: Payer: Self-pay | Admitting: Cardiology

## 2014-07-04 NOTE — Telephone Encounter (Signed)
Rx has been sent to the pharmacy electronically. ° °

## 2014-07-22 ENCOUNTER — Ambulatory Visit (INDEPENDENT_AMBULATORY_CARE_PROVIDER_SITE_OTHER): Payer: Medicare Other | Admitting: Pharmacist Clinician (PhC)/ Clinical Pharmacy Specialist

## 2014-07-22 DIAGNOSIS — Z7901 Long term (current) use of anticoagulants: Secondary | ICD-10-CM

## 2014-07-22 DIAGNOSIS — Z954 Presence of other heart-valve replacement: Secondary | ICD-10-CM

## 2014-07-22 DIAGNOSIS — Z952 Presence of prosthetic heart valve: Secondary | ICD-10-CM

## 2014-07-22 DIAGNOSIS — Z5181 Encounter for therapeutic drug level monitoring: Secondary | ICD-10-CM

## 2014-07-22 DIAGNOSIS — I341 Nonrheumatic mitral (valve) prolapse: Secondary | ICD-10-CM

## 2014-07-22 LAB — POCT INR: INR: 2.1

## 2014-09-02 ENCOUNTER — Ambulatory Visit (INDEPENDENT_AMBULATORY_CARE_PROVIDER_SITE_OTHER): Payer: Medicare Other | Admitting: Pharmacist Clinician (PhC)/ Clinical Pharmacy Specialist

## 2014-09-02 DIAGNOSIS — Z7901 Long term (current) use of anticoagulants: Secondary | ICD-10-CM

## 2014-09-02 DIAGNOSIS — Z952 Presence of prosthetic heart valve: Secondary | ICD-10-CM

## 2014-09-02 DIAGNOSIS — I341 Nonrheumatic mitral (valve) prolapse: Secondary | ICD-10-CM

## 2014-09-02 DIAGNOSIS — Z954 Presence of other heart-valve replacement: Secondary | ICD-10-CM

## 2014-09-02 DIAGNOSIS — Z5181 Encounter for therapeutic drug level monitoring: Secondary | ICD-10-CM

## 2014-09-02 LAB — POCT INR: INR: 2.4

## 2014-09-27 ENCOUNTER — Other Ambulatory Visit: Payer: Self-pay | Admitting: Cardiology

## 2014-09-27 NOTE — Telephone Encounter (Signed)
Rx has been sent to the pharmacy electronically. ° °

## 2014-09-30 ENCOUNTER — Other Ambulatory Visit: Payer: Self-pay | Admitting: Cardiology

## 2014-10-07 ENCOUNTER — Emergency Department (HOSPITAL_COMMUNITY)
Admission: EM | Admit: 2014-10-07 | Discharge: 2014-10-07 | Disposition: A | Discharge status: Home / Self Care | Payer: Medicare Other | Attending: Emergency Medicine | Admitting: Emergency Medicine

## 2014-10-07 ENCOUNTER — Encounter (HOSPITAL_COMMUNITY): Payer: Self-pay | Admitting: Emergency Medicine

## 2014-10-07 DIAGNOSIS — Z880 Allergy status to penicillin: Secondary | ICD-10-CM | POA: Diagnosis not present

## 2014-10-07 DIAGNOSIS — Z87891 Personal history of nicotine dependence: Secondary | ICD-10-CM | POA: Diagnosis not present

## 2014-10-07 DIAGNOSIS — K209 Esophagitis, unspecified: Secondary | ICD-10-CM | POA: Diagnosis not present

## 2014-10-07 DIAGNOSIS — Z9889 Other specified postprocedural states: Secondary | ICD-10-CM | POA: Insufficient documentation

## 2014-10-07 DIAGNOSIS — E785 Hyperlipidemia, unspecified: Secondary | ICD-10-CM | POA: Insufficient documentation

## 2014-10-07 DIAGNOSIS — Z951 Presence of aortocoronary bypass graft: Secondary | ICD-10-CM | POA: Insufficient documentation

## 2014-10-07 DIAGNOSIS — Z23 Encounter for immunization: Secondary | ICD-10-CM | POA: Insufficient documentation

## 2014-10-07 DIAGNOSIS — Z87448 Personal history of other diseases of urinary system: Secondary | ICD-10-CM | POA: Diagnosis not present

## 2014-10-07 DIAGNOSIS — Y9389 Activity, other specified: Secondary | ICD-10-CM | POA: Diagnosis not present

## 2014-10-07 DIAGNOSIS — Z8673 Personal history of transient ischemic attack (TIA), and cerebral infarction without residual deficits: Secondary | ICD-10-CM | POA: Insufficient documentation

## 2014-10-07 DIAGNOSIS — Y92007 Garden or yard of unspecified non-institutional (private) residence as the place of occurrence of the external cause: Secondary | ICD-10-CM | POA: Diagnosis not present

## 2014-10-07 DIAGNOSIS — S59912A Unspecified injury of left forearm, initial encounter: Secondary | ICD-10-CM | POA: Diagnosis present

## 2014-10-07 DIAGNOSIS — Z87442 Personal history of urinary calculi: Secondary | ICD-10-CM | POA: Insufficient documentation

## 2014-10-07 DIAGNOSIS — W1839XA Other fall on same level, initial encounter: Secondary | ICD-10-CM | POA: Insufficient documentation

## 2014-10-07 DIAGNOSIS — S50812A Abrasion of left forearm, initial encounter: Secondary | ICD-10-CM | POA: Insufficient documentation

## 2014-10-07 DIAGNOSIS — Y998 Other external cause status: Secondary | ICD-10-CM | POA: Diagnosis not present

## 2014-10-07 DIAGNOSIS — R55 Syncope and collapse: Secondary | ICD-10-CM | POA: Diagnosis not present

## 2014-10-07 DIAGNOSIS — I4891 Unspecified atrial fibrillation: Secondary | ICD-10-CM | POA: Insufficient documentation

## 2014-10-07 DIAGNOSIS — Z8709 Personal history of other diseases of the respiratory system: Secondary | ICD-10-CM | POA: Diagnosis not present

## 2014-10-07 DIAGNOSIS — Z79899 Other long term (current) drug therapy: Secondary | ICD-10-CM | POA: Diagnosis not present

## 2014-10-07 DIAGNOSIS — Z7901 Long term (current) use of anticoagulants: Secondary | ICD-10-CM | POA: Insufficient documentation

## 2014-10-07 DIAGNOSIS — I10 Essential (primary) hypertension: Secondary | ICD-10-CM | POA: Insufficient documentation

## 2014-10-07 DIAGNOSIS — S40812A Abrasion of left upper arm, initial encounter: Secondary | ICD-10-CM

## 2014-10-07 DIAGNOSIS — I2510 Atherosclerotic heart disease of native coronary artery without angina pectoris: Secondary | ICD-10-CM | POA: Diagnosis not present

## 2014-10-07 MED ORDER — TETANUS-DIPHTH-ACELL PERTUSSIS 5-2.5-18.5 LF-MCG/0.5 IM SUSP
0.5000 mL | Freq: Once | INTRAMUSCULAR | Status: AC
  Administered 2014-10-07: 0.5 mL via INTRAMUSCULAR
  Filled 2014-10-07: qty 0.5

## 2014-10-07 NOTE — ED Notes (Signed)
Pt states he was helping a neighbor pick up sticks in his yard and after bending down several times to pick up sticks he became dizzy and fell against his building and went to the ground. Pt denies any LOC or hitting his head. Pt denies any dizziness at this time. Pt is alert and ox3.

## 2014-10-07 NOTE — Discharge Instructions (Signed)
Abrasion An abrasion is a cut or scrape of the skin. Abrasions do not extend through all layers of the skin and most heal within 10 days. It is important to care for your abrasion properly to prevent infection. CAUSES  Most abrasions are caused by falling on, or gliding across, the ground or other surface. When your skin rubs on something, the outer and inner layer of skin rubs off, causing an abrasion. DIAGNOSIS  Your caregiver will be able to diagnose an abrasion during a physical exam.  TREATMENT  Your treatment depends on how large and deep the abrasion is. Generally, your abrasion will be cleaned with water and a mild soap to remove any dirt or debris. An antibiotic ointment may be put over the abrasion to prevent an infection. A bandage (dressing) may be wrapped around the abrasion to keep it from getting dirty.  You may need a tetanus shot if:  You cannot remember when you had your last tetanus shot.  You have never had a tetanus shot.  The injury broke your skin. If you get a tetanus shot, your arm may swell, get red, and feel warm to the touch. This is common and not a problem. If you need a tetanus shot and you choose not to have one, there is a rare chance of getting tetanus. Sickness from tetanus can be serious.  HOME CARE INSTRUCTIONS   If a dressing was applied, change it at least once a day or as directed by your caregiver. If the bandage sticks, soak it off with warm water.   Wash the area with water and a mild soap to remove all the ointment 2 times a day. Rinse off the soap and pat the area dry with a clean towel.   Reapply any ointment as directed by your caregiver. This will help prevent infection and keep the bandage from sticking. Use gauze over the wound and under the dressing to help keep the bandage from sticking.   Change your dressing right away if it becomes wet or dirty.   Only take over-the-counter or prescription medicines for pain, discomfort, or fever as  directed by your caregiver.   Follow up with your caregiver within 24-48 hours for a wound check, or as directed. If you were not given a wound-check appointment, look closely at your abrasion for redness, swelling, or pus. These are signs of infection. SEEK IMMEDIATE MEDICAL CARE IF:   You have increasing pain in the wound.   You have redness, swelling, or tenderness around the wound.   You have pus coming from the wound.   You have a fever or persistent symptoms for more than 2-3 days.  You have a fever and your symptoms suddenly get worse.  You have a bad smell coming from the wound or dressing.  MAKE SURE YOU:   Understand these instructions.  Will watch your condition.  Will get help right away if you are not doing well or get worse. Document Released: 03/10/2005 Document Revised: 05/17/2012 Document Reviewed: 05/04/2011 [facility] Patient Information 2015 [place], [place]. This information is not intended to replace advice given to you by your health care provider. Make sure you discuss any questions you have with your health care provider.  Near-Syncope Near-syncope (commonly known as near fainting) is sudden weakness, dizziness, or feeling like you might pass out. During an episode of near-syncope, you may also develop pale skin, have tunnel vision, or feel sick to your stomach (nauseous). Near-syncope may occur when getting up  after sitting or while standing for a long time. It is caused by a sudden decrease in blood flow to the brain. This decrease can result from various causes or triggers, most of which are not serious. However, because near-syncope can sometimes be a sign of something serious, a medical evaluation is required. The specific cause is often not determined. HOME CARE INSTRUCTIONS  Monitor your condition for any changes. The following actions may help to alleviate any discomfort you are experiencing:  Have someone stay with you until you feel stable.  Lie  down right away and prop your feet up if you start feeling like you might faint. Breathe deeply and steadily. Wait until all the symptoms have passed. Most of these episodes last only a few minutes. You may feel tired for several hours.   Drink enough fluids to keep your urine clear or pale yellow.   If you are taking blood pressure or heart medicine, get up slowly when seated or lying down. Take several minutes to sit and then stand. This can reduce dizziness.  Follow up with your health care provider as directed. SEEK IMMEDIATE MEDICAL CARE IF:   You have a severe headache.   You have unusual pain in the chest, abdomen, or back.   You are bleeding from the mouth or rectum, or you have black or tarry stool.   You have an irregular or very fast heartbeat.   You have repeated fainting or have seizure-like jerking during an episode.   You faint when sitting or lying down.   You have confusion.   You have difficulty walking.   You have severe weakness.   You have vision problems.  MAKE SURE YOU:   Understand these instructions.  Will watch your condition.  Will get help right away if you are not doing well or get worse. Document Released: 05/31/2005 Document Revised: 06/05/2013 Document Reviewed: 11/03/2012 [facility] Patient Information 2015 [place], [place]. This information is not intended to replace advice given to you by your health care provider. Make sure you discuss any questions you have with your health care provider.

## 2014-10-07 NOTE — ED Notes (Signed)
MD at bedside. 

## 2014-10-07 NOTE — ED Provider Notes (Signed)
CSN: [id number]     Arrival date & time 10/07/14  2118 History   First MD Initiated Contact with Patient 10/07/14 2136     Chief Complaint  Patient presents with  . Near Syncope      (Consider location/radiation/quality/duration/timing/severity/associated sxs/prior Treatment) HPI   Arthur [name] is a 81 y.o. male who presents for evaluation of near syncope. He was working picking up limbs in a neighbor's yard, when he felt dizzy and laid against a building to stand up. At that point, his friend laid him down. After lying there for about 10 minutes he felt better. An EMS unit arrived and attempted to stand up, which he did easily and was able to walk. He feels better at this time, has had no recurrence of symptoms. He's not had this problem previously. He ate dinner and took his evening medications about 1 hour before onset of symptoms. According to his wife, he was more active today, then usual. He has been taking his usual medications. There are no other known modifying factors.   Past Medical History  Diagnosis Date  . Hyperlipidemia   . Hypertension   . Coronary artery disease     3-vessel coronary artery disease  . MVP (mitral valve prolapse)   . Atrial fibrillation     remote history  . Chronic renal insufficiency   . History of recurrent TIAs   . Hypertensive vascular disease   . CVA (cerebrovascular accident)     RIGHT BRAIN  . Odynophagia   . Esophagitis     Distal esophagitis  . Nephrolithiasis   . Shortness of breath   . Transudative pleural effusion    Past Surgical History  Procedure Laterality Date  . Cardiac catheterization  03/19/2004  . Maze  2005  . Coronary artery bypass graft  2005    LIMA GRAFT TO THE DISTAL LAD, SAPHENOUS VEIN GRAFT TO THE FIRST DIADGONAL BRANCH, SAPHENOUS VEIN GRAFT TO THE THIRD MARIGINAL BRANCH, AND SAPHENOUS VEIN GRAFT TO THE PDA  . Mitral valve repair    . Appendectomy    . Tee without cardioversion  05/19/2011    Procedure:  TRANSESOPHAGEAL ECHOCARDIOGRAM (TEE);  Surgeon: Peter Martinique, MD;  Location: [facility] ENDOSCOPY;  Service: Cardiovascular;  Laterality: N/A;   Family History  Problem Relation Age of Onset  . Heart attack Mother   . Stroke Father   . Hypertension Father   . Kidney failure Father    History  Substance Use Topics  . Smoking status: Former Smoker    Quit date: 06/15/1955  . Smokeless tobacco: Not on file  . Alcohol Use: No    Review of Systems  All other systems reviewed and are negative.     Allergies  Lasix; Penicillins; and Sulfa drugs cross reactors  Home Medications   Prior to Admission medications   Medication Sig Start Date End Date Taking? Authorizing Provider  amLODipine (NORVASC) 5 MG tablet TAKE 1 TABLET (5 MG TOTAL) BY MOUTH DAILY. 09/27/14   Peter M Martinique, MD  benazepril (LOTENSIN) 40 MG tablet TAKE 1 TABLET (40 MG TOTAL) BY MOUTH DAILY. 09/27/14   Peter M Martinique, MD  benazepril (LOTENSIN) 40 MG tablet TAKE 1 TABLET (40 MG TOTAL) BY MOUTH DAILY. 09/30/14   Peter M Martinique, MD  CRESTOR 20 MG tablet TAKE 1 TABLET (20 MG TOTAL) BY MOUTH DAILY. 07/04/14   Peter M Martinique, MD  ketoconazole (NIZORAL) 2 % cream Apply as needed 01/03/14   Historical Provider, MD  metoprolol tartrate (LOPRESSOR) 25 MG tablet TAKE 1 TABLET (25 MG TOTAL) BY MOUTH 2 (TWO) TIMES DAILY WITH FOOD. 07/04/14   Peter M Martinique, MD  ranitidine (ZANTAC) 150 MG tablet TAKE 1 TABLET (150 MG TOTAL) BY MOUTH 2 (TWO) TIMES DAILY. 07/04/14   Peter M Martinique, MD  warfarin (COUMADIN) 2.5 MG tablet [name] as directed by anticoagulation clinic 11/26/13   Peter M Martinique, MD  warfarin (COUMADIN) 2.5 MG tablet TAKE AS DIRECTED BY ANTICOAGULATION CLINIC 07/04/14   Peter M Martinique, MD   BP 127/83 mmHg  Pulse 84  Temp(Src) 98.2 F (36.8 C) (Oral)  Resp 16  Ht 5\' 7"  (1.702 m)  Wt 172 lb (78.019 kg)  BMI 26.93 kg/m2  SpO2 99% Physical Exam  Constitutional: He is oriented to person, place, and time. He appears well-developed.   Elderly, frail  HENT:  Head: Normocephalic and atraumatic.  Right Ear: External ear normal.  Left Ear: External ear normal.  Eyes: Conjunctivae and EOM are normal. Pupils are equal, round, and reactive to light.  Neck: Normal range of motion and phonation normal. Neck supple.  Cardiovascular: Normal rate, regular rhythm and normal heart sounds.   Pulmonary/Chest: Effort normal and breath sounds normal. He exhibits no bony tenderness.  Abdominal: Soft. There is no tenderness.  Musculoskeletal: Normal range of motion. He exhibits no edema or tenderness.  Superficial abrasion, left dorsal forearm, without deformity or crepitation.  Neurological: He is alert and oriented to person, place, and time. No cranial nerve deficit or sensory deficit. He exhibits normal muscle tone. Coordination normal.  Skin: Skin is warm, dry and intact.  Psychiatric: He has a normal mood and affect. His behavior is normal. Judgment and thought content normal.  Nursing note and vitals reviewed.   ED Course  Procedures (including critical care time)   Findings discussed with patient and wife,  all questions answered.      Labs Review Labs Reviewed - No data to display  Imaging Review No results found.   EKG Interpretation   Date/Time:  Monday October 07 2014 21:23:10 EDT Ventricular Rate:  86 PR Interval:    QRS Duration: 96 QT Interval:  382 QTC Calculation: 457 R Axis:   -40 Text Interpretation:  Atrial fibrillation Ventricular premature complex  Anterior infarct, old Baseline wander in lead(s) V6 Since last tracing  Atrial fibrillation has returned Confirmed by Eulis Foster  MD, Khayree Delellis [phone])  on 10/07/2014 10:13:48 PM      MDM   Final diagnoses:  Near syncope  Abrasion of left arm, initial encounter    Near-syncope, likely multifactorial, without evidence for serious patella, infection, metabolic instability or suggestion of impending vascular collapse. Patient has paroxysmal atrial  fibrillation, is anticoagulated, he has not had any recent changes in his medical history or ability to eat and drink.   Nursing Notes Reviewed/ Care Coordinated Applicable Imaging Reviewed Interpretation of Laboratory Data incorporated into ED treatment  The patient appears reasonably screened and/or stabilized for discharge and I doubt any other medical condition or other [facility] requiring further screening, evaluation, or treatment in the ED at this time prior to discharge.  Plan: Home Medications- usual; Home Treatments- rest; return here if the recommended treatment, does not improve the symptoms; Recommended follow up- PCP prn     Daleen Bo, MD 10/07/14 2252

## 2014-10-14 ENCOUNTER — Ambulatory Visit (INDEPENDENT_AMBULATORY_CARE_PROVIDER_SITE_OTHER): Payer: Medicare Other | Admitting: Pharmacist Clinician (PhC)/ Clinical Pharmacy Specialist

## 2014-10-14 DIAGNOSIS — Z5181 Encounter for therapeutic drug level monitoring: Secondary | ICD-10-CM | POA: Diagnosis not present

## 2014-10-14 DIAGNOSIS — Z7901 Long term (current) use of anticoagulants: Secondary | ICD-10-CM | POA: Diagnosis not present

## 2014-10-14 DIAGNOSIS — Z954 Presence of other heart-valve replacement: Secondary | ICD-10-CM

## 2014-10-14 DIAGNOSIS — Z952 Presence of prosthetic heart valve: Secondary | ICD-10-CM

## 2014-10-14 DIAGNOSIS — I341 Nonrheumatic mitral (valve) prolapse: Secondary | ICD-10-CM | POA: Diagnosis not present

## 2014-10-14 LAB — POCT INR: INR: 2

## 2014-11-25 ENCOUNTER — Ambulatory Visit (INDEPENDENT_AMBULATORY_CARE_PROVIDER_SITE_OTHER): Payer: Medicare Other | Admitting: Pharmacist Clinician (PhC)/ Clinical Pharmacy Specialist

## 2014-11-25 DIAGNOSIS — Z7901 Long term (current) use of anticoagulants: Secondary | ICD-10-CM

## 2014-11-25 DIAGNOSIS — Z954 Presence of other heart-valve replacement: Secondary | ICD-10-CM | POA: Diagnosis not present

## 2014-11-25 DIAGNOSIS — I341 Nonrheumatic mitral (valve) prolapse: Secondary | ICD-10-CM

## 2014-11-25 DIAGNOSIS — Z952 Presence of prosthetic heart valve: Secondary | ICD-10-CM

## 2014-11-25 DIAGNOSIS — Z5181 Encounter for therapeutic drug level monitoring: Secondary | ICD-10-CM | POA: Diagnosis not present

## 2014-11-25 LAB — POCT INR: INR: 2.4

## 2014-12-02 ENCOUNTER — Telehealth: Payer: Self-pay | Admitting: Cardiology

## 2014-12-02 NOTE — Telephone Encounter (Signed)
Pt.s wife called and stated Mr. Nearhood feels fine now , no CP or SOB and no other complaints, pt.s wife encouraged to call EMS if this happens again. pts wife agreed with plan

## 2014-12-02 NOTE — Telephone Encounter (Signed)
Pt's wife called in stating that the pt had an episode last night around 12 midnight when he went to the car to get his laptop and when he was coming back to the house in the process of him walking up the steps he began to have some dizziness, lightheadedness, and some sweating. The wife would like for the pt to be checked out before 7/8. Please contact her and advise.   Thanks

## 2014-12-03 ENCOUNTER — Other Ambulatory Visit: Payer: Self-pay | Admitting: Cardiology

## 2014-12-03 NOTE — Telephone Encounter (Signed)
Rx(s) sent to pharmacy electronically.  

## 2014-12-20 ENCOUNTER — Ambulatory Visit (INDEPENDENT_AMBULATORY_CARE_PROVIDER_SITE_OTHER): Payer: Medicare Other | Admitting: Cardiology

## 2014-12-20 ENCOUNTER — Encounter: Payer: Self-pay | Admitting: Cardiology

## 2014-12-20 ENCOUNTER — Ambulatory Visit (INDEPENDENT_AMBULATORY_CARE_PROVIDER_SITE_OTHER): Payer: Medicare Other | Admitting: Pharmacist Clinician (PhC)/ Clinical Pharmacy Specialist

## 2014-12-20 VITALS — BP 134/76 | HR 64 | Ht 66.0 in | Wt 172.2 lb

## 2014-12-20 DIAGNOSIS — Z5181 Encounter for therapeutic drug level monitoring: Secondary | ICD-10-CM

## 2014-12-20 DIAGNOSIS — I639 Cerebral infarction, unspecified: Secondary | ICD-10-CM

## 2014-12-20 DIAGNOSIS — Z952 Presence of prosthetic heart valve: Secondary | ICD-10-CM

## 2014-12-20 DIAGNOSIS — Z7901 Long term (current) use of anticoagulants: Secondary | ICD-10-CM

## 2014-12-20 DIAGNOSIS — Z954 Presence of other heart-valve replacement: Secondary | ICD-10-CM | POA: Diagnosis not present

## 2014-12-20 DIAGNOSIS — I25709 Atherosclerosis of coronary artery bypass graft(s), unspecified, with unspecified angina pectoris: Secondary | ICD-10-CM | POA: Diagnosis not present

## 2014-12-20 DIAGNOSIS — I4819 Other persistent atrial fibrillation: Secondary | ICD-10-CM

## 2014-12-20 DIAGNOSIS — I341 Nonrheumatic mitral (valve) prolapse: Secondary | ICD-10-CM

## 2014-12-20 DIAGNOSIS — I481 Persistent atrial fibrillation: Secondary | ICD-10-CM | POA: Diagnosis not present

## 2014-12-20 LAB — POCT INR: INR: 1.4

## 2014-12-20 NOTE — Patient Instructions (Signed)
We will schedule you for a nuclear stress test  Continue your current therapy   

## 2014-12-20 NOTE — Progress Notes (Signed)
Arthur [name] Date of Birth: 17-Jun-1928   History of Present Illness: Arthur [name] is seen for followup of CAD. He has a history of coronary disease and is status post CABG in 2005. He had mitral valve repair and a Maze procedure at that time. He subsequently developed recurrent CVA and TEE demonstrated persistent left atrial appendage thrombus despite a history of previous ligation of the appendage. He has been on chronic anticoagulation.  He presented to the ED in April with a near syncopal episode. He was out picking up limbs in his yard. While bending over he slumped to the ground. He came to quickly and felt OK afterwards. In the ED Ecg did show new Afib with rate 86 bpm. No other acute findings. Since then he has had a couple of episodes of feeling funny- BP elevated at those times. He also notes symptoms of mid sternal chest tightness and indigestion with exertion that is new.    Current Outpatient Prescriptions on File Prior to Visit  Medication Sig Dispense Refill  . amLODipine (NORVASC) 5 MG tablet TAKE 1 TABLET (5 MG TOTAL) BY MOUTH DAILY. 90 tablet 2  . benazepril (LOTENSIN) 40 MG tablet TAKE 1 TABLET (40 MG TOTAL) BY MOUTH DAILY. 90 tablet 0  . CRESTOR 20 MG tablet TAKE 1 TABLET (20 MG TOTAL) BY MOUTH DAILY. 30 tablet 6  . metoprolol tartrate (LOPRESSOR) 25 MG tablet TAKE 1 TABLET (25 MG TOTAL) BY MOUTH 2 (TWO) TIMES DAILY WITH FOOD. 60 tablet 9  . ranitidine (ZANTAC) 150 MG tablet TAKE 1 TABLET (150 MG TOTAL) BY MOUTH 2 (TWO) TIMES DAILY. 60 tablet 9  . warfarin (COUMADIN) 2.5 MG tablet [name] as directed by anticoagulation clinic 30 tablet 11   No current facility-administered medications on file prior to visit.    Allergies  Allergen Reactions  . Lasix [Furosemide] Rash  . Penicillins Rash  . Sulfa Drugs Cross Reactors Rash    Past Medical History  Diagnosis Date  . Hyperlipidemia   . Hypertension   . Coronary artery disease     3-vessel coronary artery disease  . MVP  (mitral valve prolapse)   . Atrial fibrillation   . Chronic renal insufficiency   . History of recurrent TIAs   . Hypertensive vascular disease   . CVA (cerebrovascular accident)     RIGHT BRAIN  . Odynophagia   . Esophagitis     Distal esophagitis  . Nephrolithiasis   . Shortness of breath   . Transudative pleural effusion     Past Surgical History  Procedure Laterality Date  . Cardiac catheterization  03/19/2004  . Maze  2005  . Coronary artery bypass graft  2005    LIMA GRAFT TO THE DISTAL LAD, SAPHENOUS VEIN GRAFT TO THE FIRST DIADGONAL BRANCH, SAPHENOUS VEIN GRAFT TO THE THIRD MARIGINAL BRANCH, AND SAPHENOUS VEIN GRAFT TO THE PDA  . Mitral valve repair    . Appendectomy    . Tee without cardioversion  05/19/2011    Procedure: TRANSESOPHAGEAL ECHOCARDIOGRAM (TEE);  Surgeon: Kenzlie Disch Martinique, MD;  Location: [facility] ENDOSCOPY;  Service: Cardiovascular;  Laterality: N/A;    History  Smoking status  . Former Smoker  . Quit date: 06/15/1955  Smokeless tobacco  . Not on file    History  Alcohol Use No    Family History  Problem Relation Age of Onset  . Heart attack Mother   . Stroke Father   . Hypertension Father   . Kidney failure Father  Review of Systems: As noted in history of present illness.  All other systems were reviewed and are negative.  Physical Exam: BP 134/76 mmHg  Pulse 64  Ht 5\' 6"  (1.676 m)  Wt 78.109 kg (172 lb 3.2 oz)  BMI 27.81 kg/m2 He is a pleasant elderly male in no apparent distress. HEENT is normal.  Neck is without JVD, adenopathy, thyromegaly, or bruits. Lungs are clear. Cardiac exam reveals a regular rate and rhythm without gallop, murmur, or click. Normal S1-2. No gallop or murmur. Abdomen is soft and nontender without masses or bruits. His femoral and pedal pulses are 2+. He has no edema. He is alert and oriented x3 and cranial nerves II through XII are intact.  LABORATORY DATA:  INR was 1.4  Lab Results  Component Value Date   WBC  10.4 05/14/2011   HGB 15.4 05/14/2011   HCT 45.8 05/14/2011   PLT 229 05/14/2011   GLUCOSE 99 06/17/2014   CHOL 158 06/17/2014   TRIG 103.0 06/17/2014   HDL 43.20 06/17/2014   LDLCALC 94 06/17/2014   ALT 21 06/17/2014   AST 28 06/17/2014   NA 138 06/17/2014   K 4.4 06/17/2014   CL 105 06/17/2014   CREATININE 1.3 06/17/2014   BUN 20 06/17/2014   CO2 26 06/17/2014   TSH 0.293* 05/14/2011   INR 1.4 12/20/2014   HGBA1C 5.6 05/14/2011    Assessment / Plan: 1. History of CVA. TEE documented a left atrial appendage thrombus despite prior ligation of the atrial appendage. He is on chronic anticoagulation and this is adjusted today by pharmacy.  2. Coronary disease status post CABG in 2005. He also had a Maze procedure at that time.  Patient does have some symptoms concerning for angina. Last myoview was 2008. Will update a stress Myoview. We will continue his current medical therapy.   3. Atrial fibrillation-status post Maze procedure. He was in Afib in April which may have contributed to his near syncopal episode. Will continue metoprolol and coumadin. On exam I think he is in NSR today.  4. Hypercholesterolemia. On chronic Crestor.

## 2015-01-01 ENCOUNTER — Telehealth (HOSPITAL_COMMUNITY): Payer: Self-pay

## 2015-01-01 NOTE — Telephone Encounter (Signed)
Encounter complete. 

## 2015-01-02 ENCOUNTER — Telehealth (HOSPITAL_COMMUNITY): Payer: Self-pay | Admitting: Radiology

## 2015-01-02 NOTE — Telephone Encounter (Signed)
Encounter complete. 

## 2015-01-03 ENCOUNTER — Ambulatory Visit (HOSPITAL_COMMUNITY)
Admission: RE | Admit: 2015-01-03 | Discharge: 2015-01-03 | Disposition: A | Discharge status: Home / Self Care | Payer: Medicare Other | Source: Ambulatory Visit | Attending: Cardiology | Admitting: Cardiology

## 2015-01-03 ENCOUNTER — Ambulatory Visit (INDEPENDENT_AMBULATORY_CARE_PROVIDER_SITE_OTHER): Payer: Medicare Other | Admitting: Pharmacist Clinician (PhC)/ Clinical Pharmacy Specialist

## 2015-01-03 ENCOUNTER — Telehealth: Payer: Self-pay

## 2015-01-03 DIAGNOSIS — I481 Persistent atrial fibrillation: Secondary | ICD-10-CM | POA: Insufficient documentation

## 2015-01-03 DIAGNOSIS — Z952 Presence of prosthetic heart valve: Secondary | ICD-10-CM

## 2015-01-03 DIAGNOSIS — Z954 Presence of other heart-valve replacement: Secondary | ICD-10-CM

## 2015-01-03 DIAGNOSIS — I25709 Atherosclerosis of coronary artery bypass graft(s), unspecified, with unspecified angina pectoris: Secondary | ICD-10-CM | POA: Diagnosis not present

## 2015-01-03 DIAGNOSIS — I4819 Other persistent atrial fibrillation: Secondary | ICD-10-CM

## 2015-01-03 DIAGNOSIS — Z7901 Long term (current) use of anticoagulants: Secondary | ICD-10-CM

## 2015-01-03 DIAGNOSIS — I639 Cerebral infarction, unspecified: Secondary | ICD-10-CM

## 2015-01-03 DIAGNOSIS — I341 Nonrheumatic mitral (valve) prolapse: Secondary | ICD-10-CM | POA: Insufficient documentation

## 2015-01-03 DIAGNOSIS — Z5181 Encounter for therapeutic drug level monitoring: Secondary | ICD-10-CM | POA: Diagnosis not present

## 2015-01-03 DIAGNOSIS — I4891 Unspecified atrial fibrillation: Secondary | ICD-10-CM | POA: Diagnosis not present

## 2015-01-03 LAB — MYOCARDIAL PERFUSION IMAGING
LV dias vol: 80 mL
LVSYSVOL: 26 mL
Peak HR: 79 {beats}/min
Rest HR: 79 {beats}/min
SDS: 4
SRS: 7
SSS: 11
TID: 0.99

## 2015-01-03 LAB — POCT INR: INR: 2

## 2015-01-03 MED ORDER — TECHNETIUM TC 99M SESTAMIBI GENERIC - CARDIOLITE
10.8000 | Freq: Once | INTRAVENOUS | Status: AC | PRN
  Administered 2015-01-03: 11 via INTRAVENOUS

## 2015-01-03 MED ORDER — TECHNETIUM TC 99M SESTAMIBI GENERIC - CARDIOLITE
31.1000 | Freq: Once | INTRAVENOUS | Status: AC | PRN
  Administered 2015-01-03: 31.1 via INTRAVENOUS

## 2015-01-03 MED ORDER — REGADENOSON 0.4 MG/5ML IV SOLN
0.4000 mg | Freq: Once | INTRAVENOUS | Status: AC
  Administered 2015-01-03: 0.4 mg via INTRAVENOUS

## 2015-01-03 NOTE — Telephone Encounter (Signed)
Spoke to patient Dr.Jordan out of office next week.01/03/15 myoview abnormal.Spoke to DOD Dr.Hochrein he advised patient needs appointment with a extender next week.Advised I' am also out of office next week.I will send message to Fredia Beets RN to arrange appointment.

## 2015-01-06 ENCOUNTER — Telehealth: Payer: Self-pay | Admitting: Cardiology

## 2015-01-06 NOTE — Telephone Encounter (Signed)
Pt's wife called in stating that on 7/22 afternoon , Malachy Mood called the pt and told them that he needed to have a follow up appt with an extender due to an abnormal stress test . There were no PA appts available this week so I placed him on Bryan's schedule for 8/1. Is that ok? Please cal pt if you need to   Thanks

## 2015-01-06 NOTE — Telephone Encounter (Signed)
Follow up scheduled

## 2015-01-13 ENCOUNTER — Encounter: Payer: Self-pay | Admitting: Cardiology

## 2015-01-13 ENCOUNTER — Ambulatory Visit (INDEPENDENT_AMBULATORY_CARE_PROVIDER_SITE_OTHER): Payer: Medicare Other | Admitting: Physician Assistant

## 2015-01-13 ENCOUNTER — Encounter: Payer: Self-pay | Admitting: Physician Assistant

## 2015-01-13 ENCOUNTER — Ambulatory Visit
Admission: RE | Admit: 2015-01-13 | Discharge: 2015-01-13 | Disposition: A | Discharge status: Home / Self Care | Payer: Medicare Other | Source: Ambulatory Visit | Attending: Physician Assistant | Admitting: Physician Assistant

## 2015-01-13 VITALS — BP 132/64 | HR 70 | Ht 65.25 in | Wt 171.2 lb

## 2015-01-13 DIAGNOSIS — D689 Coagulation defect, unspecified: Secondary | ICD-10-CM

## 2015-01-13 DIAGNOSIS — Z01818 Encounter for other preprocedural examination: Secondary | ICD-10-CM

## 2015-01-13 DIAGNOSIS — I2510 Atherosclerotic heart disease of native coronary artery without angina pectoris: Secondary | ICD-10-CM

## 2015-01-13 DIAGNOSIS — Z7901 Long term (current) use of anticoagulants: Secondary | ICD-10-CM

## 2015-01-13 DIAGNOSIS — R9439 Abnormal result of other cardiovascular function study: Secondary | ICD-10-CM | POA: Diagnosis not present

## 2015-01-13 DIAGNOSIS — I2583 Coronary atherosclerosis due to lipid rich plaque: Secondary | ICD-10-CM

## 2015-01-13 DIAGNOSIS — I10 Essential (primary) hypertension: Secondary | ICD-10-CM

## 2015-01-13 DIAGNOSIS — E785 Hyperlipidemia, unspecified: Secondary | ICD-10-CM

## 2015-01-13 NOTE — Assessment & Plan Note (Signed)
We'll check labs prior to the procedure.

## 2015-01-13 NOTE — Assessment & Plan Note (Signed)
Status post abnormal nuclear stress test which is considered intermediate risk.  We discussed the risks of cardiac catheterization including, but are not limited to bleeding, infection, vascular injury, stroke, myocardial infection, arrhythmia, kidney injury, radiation-related injury in the case of prolonged fluoroscopy use, emergency cardiac surgery, and death. The patient understands the risks of serious complication is low (<2%).   Patient will follow up with Tommy Medal for Coumadin to Lovenox bridging.  He was scheduled with Dr. Martinique with procedure which I do not think is urgent at this time. He is on a beta blocker.

## 2015-01-13 NOTE — Assessment & Plan Note (Signed)
Continue statin. 

## 2015-01-13 NOTE — Patient Instructions (Signed)
Your physician has requested that you have a cardiac catheterization. Cardiac catheterization is used to diagnose and/or treat various heart conditions. Doctors may recommend this procedure for a number of different reasons. The most common reason is to evaluate chest pain. Chest pain can be a symptom of coronary artery disease (CAD), and cardiac catheterization can show whether plaque is narrowing or blocking your heart's arteries. This procedure is also used to evaluate the valves, as well as measure the blood flow and oxygen levels in different parts of your heart. For further information please visit [email or web address]. This will be scheduled with Dr. Martinique next week.  Following your catheterization, you will not be allowed to drive for 3 days.  No lifting, pushing, or pulling greater that 10 pounds is allowed for 1 week.  You will be required to have the following tests prior to the procedure:  1. Blood work-the blood work can be done no more than 7 days prior to the procedure.  It can be done at any [facility] lab.  There is one downstairs on the first floor of this building and one in the [facility] building [phone]).  2. Chest Xray-the chest xray order has already been placed at the [facility].   [place]. First floor.

## 2015-01-13 NOTE — Progress Notes (Signed)
Patient ID: AVIAN KONIGSBERG, male   DOB: April 25, 1929, 81 y.o.   MRN: [id number]    Date:  01/13/2015   ID:  Arthur [name], DOB 05/26/1929, MRN [id number]  PCP:  Gennette Pac, MD  Primary Cardiologist:  Martinique   Chief Complaint  Patient presents with  . Follow-up    pt states no chest pain, SOB, or swelling     History of Present Illness: Arthur [name] is a 81 y.o. male with a history of coronary disease and is status post CABG in 2005. He had mitral valve repair and a Maze procedure at that time. He subsequently developed recurrent CVA and TEE demonstrated persistent left atrial appendage thrombus despite a history of previous ligation of the appendage. He has been on chronic anticoagulation.   He presented to the ED in April with a near syncopal episode. He was out picking up limbs in his yard. While bending over he slumped to the ground. He came to quickly and felt OK afterwards. In the ED Ecg did show new Afib with rate 86 bpm. No other acute findings. Since then he has had a couple of episodes of feeling funny- BP elevated at those times. He also notes symptoms of mid sternal chest tightness and indigestion with exertion that is new.   Patient underwent nuclear stress test in 01/03/2015 revealed a normal ejection fraction however there was a moderate sized, severe intensity, reversible inferior defect consistent with moderate inferior ischemia. Patient came in to discuss the study.  At this time doesn't this sound as though he is describing any anginal symptoms however, we discussed left heart catheterization.  The patient currently denies nausea, vomiting, fever, chest pain, shortness of breath, orthopnea, dizziness, PND, cough, congestion, abdominal pain, hematochezia, melena, lower extremity edema, claudication.  Wt Readings from Last 3 Encounters:  01/13/15 171 lb 3.2 oz (77.656 kg)  12/20/14 172 lb 3.2 oz (78.109 kg)  10/07/14 172 lb (78.019 kg)     Past Medical History    Diagnosis Date  . Hyperlipidemia   . Hypertension   . Coronary artery disease     3-vessel coronary artery disease  . MVP (mitral valve prolapse)   . Atrial fibrillation   . Chronic renal insufficiency   . History of recurrent TIAs   . Hypertensive vascular disease   . CVA (cerebrovascular accident)     RIGHT BRAIN  . Odynophagia   . Esophagitis     Distal esophagitis  . Nephrolithiasis   . Shortness of breath   . Transudative pleural effusion     Current Outpatient Prescriptions  Medication Sig Dispense Refill  . amLODipine (NORVASC) 5 MG tablet TAKE 1 TABLET (5 MG TOTAL) BY MOUTH DAILY. 90 tablet 2  . benazepril (LOTENSIN) 40 MG tablet TAKE 1 TABLET (40 MG TOTAL) BY MOUTH DAILY. 90 tablet 0  . CRESTOR 20 MG tablet TAKE 1 TABLET (20 MG TOTAL) BY MOUTH DAILY. 30 tablet 6  . metoprolol tartrate (LOPRESSOR) 25 MG tablet TAKE 1 TABLET (25 MG TOTAL) BY MOUTH 2 (TWO) TIMES DAILY WITH FOOD. 60 tablet 9  . ranitidine (ZANTAC) 150 MG tablet TAKE 1 TABLET (150 MG TOTAL) BY MOUTH 2 (TWO) TIMES DAILY. 60 tablet 9  . warfarin (COUMADIN) 2.5 MG tablet [name] as directed by anticoagulation clinic 30 tablet 11   No current facility-administered medications for this visit.    Allergies:    Allergies  Allergen Reactions  . Lasix [Furosemide] Rash  . Penicillins Rash  .  Sulfa Drugs Cross Reactors Rash    Social History:  The patient  reports that he quit smoking about 59 years ago. He does not have any smokeless tobacco history on file. He reports that he does not drink alcohol or use illicit drugs.   Family history:   Family History  Problem Relation Age of Onset  . Heart attack Mother   . Stroke Father   . Hypertension Father   . Kidney failure Father     ROS:  Please see the history of present illness.  All other systems reviewed and negative.   PHYSICAL EXAM: VS:  BP 132/64 mmHg  Pulse 70  Ht 5' 5.25" (1.657 m)  Wt 171 lb 3.2 oz (77.656 kg)  BMI 28.28 kg/m2 Well  nourished, well developed, in no acute distress HEENT: Pupils are equal round react to light accommodation extraocular movements are intact.  Neck: no JVDNo cervical lymphadenopathy. Cardiac: Regular rate and rhythm without murmurs rubs or gallops. Lungs:  clear to auscultation bilaterally, no wheezing, rhonchi or rales Abd: soft, nontender, positive bowel sounds all quadrants, no hepatosplenomegaly Ext: no lower extremity edema.  2+ radial and dorsalis pedis pulses. Skin: warm and dry Neuro:  Grossly normal    ASSESSMENT AND PLAN:  Problem List Items Addressed This Visit    Long term current use of anticoagulant    Will need Coumadin to Lovenox bridging      Hypertension    Blood pressure controlled. No changes in medical therapy      Hyperlipidemia    Continue statin      Coronary artery disease    Status post abnormal nuclear stress test which is considered intermediate risk.  We discussed the risks of cardiac catheterization including, but are not limited to bleeding, infection, vascular injury, stroke, myocardial infection, arrhythmia, kidney injury, radiation-related injury in the case of prolonged fluoroscopy use, emergency cardiac surgery, and death. The patient understands the risks of serious complication is low (<2%).   Patient will follow up with Tommy Medal for Coumadin to Lovenox bridging.  He was scheduled with Dr. Martinique with procedure which I do not think is urgent at this time. He is on a beta blocker.       Other Visit Diagnoses    Abnormal stress test    -  Primary    Relevant Orders    LEFT HEART CATH    Preop examination        Relevant Orders    APTT    Basic metabolic panel    CBC    DG Chest 2 View (Completed)    Blood clotting disorder        Relevant Orders    Protime-INR

## 2015-01-13 NOTE — Assessment & Plan Note (Signed)
Will need Coumadin to Lovenox bridging

## 2015-01-13 NOTE — Assessment & Plan Note (Signed)
Heart rate well controlled. Patient is on Coumadin.

## 2015-01-13 NOTE — Assessment & Plan Note (Signed)
Blood pressure controlled. No changes in medical therapy

## 2015-01-14 ENCOUNTER — Other Ambulatory Visit: Payer: Self-pay | Admitting: *Deleted

## 2015-01-14 DIAGNOSIS — Z01818 Encounter for other preprocedural examination: Secondary | ICD-10-CM

## 2015-01-17 ENCOUNTER — Other Ambulatory Visit: Payer: Self-pay | Admitting: Pharmacist Clinician (PhC)/ Clinical Pharmacy Specialist

## 2015-01-17 ENCOUNTER — Ambulatory Visit (INDEPENDENT_AMBULATORY_CARE_PROVIDER_SITE_OTHER): Payer: Medicare Other | Admitting: Pharmacist Clinician (PhC)/ Clinical Pharmacy Specialist

## 2015-01-17 DIAGNOSIS — I341 Nonrheumatic mitral (valve) prolapse: Secondary | ICD-10-CM

## 2015-01-17 DIAGNOSIS — Z952 Presence of prosthetic heart valve: Secondary | ICD-10-CM

## 2015-01-17 DIAGNOSIS — Z7901 Long term (current) use of anticoagulants: Secondary | ICD-10-CM | POA: Diagnosis not present

## 2015-01-17 DIAGNOSIS — Z5181 Encounter for therapeutic drug level monitoring: Secondary | ICD-10-CM

## 2015-01-17 DIAGNOSIS — Z954 Presence of other heart-valve replacement: Secondary | ICD-10-CM

## 2015-01-17 LAB — CBC
HCT: 45.8 % (ref 39.0–52.0)
Hemoglobin: 15.4 g/dL (ref 13.0–17.0)
MCH: 29.6 pg (ref 26.0–34.0)
MCHC: 33.6 g/dL (ref 30.0–36.0)
MCV: 87.9 fL (ref 78.0–100.0)
MPV: 10.7 fL (ref 8.6–12.4)
Platelets: 242 10*3/uL (ref 150–400)
RBC: 5.21 MIL/uL (ref 4.22–5.81)
RDW: 14.4 % (ref 11.5–15.5)
WBC: 7.5 10*3/uL (ref 4.0–10.5)

## 2015-01-17 LAB — POCT INR: INR: 2.1

## 2015-01-17 MED ORDER — ENOXAPARIN SODIUM 80 MG/0.8ML ~~LOC~~ SOLN: 80.0000 mg | Freq: Two times a day (BID) | SUBCUTANEOUS | Status: DC

## 2015-01-17 NOTE — Patient Instructions (Addendum)
Enoxaprin Dosing Schedule  Enoxparin dose:80 mg  Date  Warfarin Dose (evenings) Enoxaprin Dose  8-6 6 1  tab (2.5 mg)   8-7 5 0   8-8 4 0 8 am   8pm  8-9 3 0 8 am   8pm  8-10 2 0 8 am   8pm  8-11 1 0 8 am  8-12 Procedure 1 tab (2.5 mg)   8-13 1 1  tab (2.5 mg) 8 am   8pm  8-14 2 1  tab (2.5 mg) 8 am   8pm  8-15 3 1  tab (2.5 mg) 8 am   8pm  8-16 4 1/2 tab (1.25 mg) 8 am   8pm  8-17 5 Repeat INR 8am   6

## 2015-01-18 LAB — PROTIME-INR
INR: 2 — ABNORMAL HIGH (ref <1.50)
Prothrombin Time: 22.7 seconds — ABNORMAL HIGH (ref 11.6–15.2)

## 2015-01-18 LAB — BASIC METABOLIC PANEL
BUN: 24 mg/dL (ref 7–25)
CALCIUM: 9.6 mg/dL (ref 8.6–10.3)
CO2: 23 mmol/L (ref 20–31)
Chloride: 103 mmol/L (ref 98–110)
Creat: 1.24 mg/dL — ABNORMAL HIGH (ref 0.70–1.11)
GLUCOSE: 94 mg/dL (ref 65–99)
Potassium: 4.8 mmol/L (ref 3.5–5.3)
SODIUM: 140 mmol/L (ref 135–146)

## 2015-01-18 LAB — APTT: aPTT: 33 seconds (ref 24–37)

## 2015-01-24 ENCOUNTER — Ambulatory Visit (HOSPITAL_COMMUNITY)
Admission: RE | Admit: 2015-01-24 | Discharge: 2015-01-25 | Disposition: A | Discharge status: Home / Self Care | Payer: Medicare Other | Source: Ambulatory Visit | Attending: Cardiology | Admitting: Cardiology

## 2015-01-24 ENCOUNTER — Encounter (HOSPITAL_COMMUNITY): Payer: Self-pay | Admitting: General Practice

## 2015-01-24 ENCOUNTER — Ambulatory Visit: Payer: Medicare Other | Admitting: Pharmacist Clinician (PhC)/ Clinical Pharmacy Specialist

## 2015-01-24 ENCOUNTER — Encounter (HOSPITAL_COMMUNITY)
Admission: RE | Disposition: A | Discharge status: Home / Self Care | Payer: Medicare Other | Source: Ambulatory Visit | Attending: Cardiology

## 2015-01-24 DIAGNOSIS — Z87891 Personal history of nicotine dependence: Secondary | ICD-10-CM | POA: Insufficient documentation

## 2015-01-24 DIAGNOSIS — R9439 Abnormal result of other cardiovascular function study: Secondary | ICD-10-CM | POA: Diagnosis present

## 2015-01-24 DIAGNOSIS — I480 Paroxysmal atrial fibrillation: Secondary | ICD-10-CM | POA: Diagnosis not present

## 2015-01-24 DIAGNOSIS — I129 Hypertensive chronic kidney disease with stage 1 through stage 4 chronic kidney disease, or unspecified chronic kidney disease: Secondary | ICD-10-CM | POA: Insufficient documentation

## 2015-01-24 DIAGNOSIS — I2584 Coronary atherosclerosis due to calcified coronary lesion: Secondary | ICD-10-CM | POA: Insufficient documentation

## 2015-01-24 DIAGNOSIS — K219 Gastro-esophageal reflux disease without esophagitis: Secondary | ICD-10-CM | POA: Insufficient documentation

## 2015-01-24 DIAGNOSIS — I2510 Atherosclerotic heart disease of native coronary artery without angina pectoris: Secondary | ICD-10-CM | POA: Diagnosis present

## 2015-01-24 DIAGNOSIS — Z7901 Long term (current) use of anticoagulants: Secondary | ICD-10-CM | POA: Insufficient documentation

## 2015-01-24 DIAGNOSIS — Z8673 Personal history of transient ischemic attack (TIA), and cerebral infarction without residual deficits: Secondary | ICD-10-CM | POA: Diagnosis not present

## 2015-01-24 DIAGNOSIS — E785 Hyperlipidemia, unspecified: Secondary | ICD-10-CM | POA: Diagnosis not present

## 2015-01-24 DIAGNOSIS — I25700 Atherosclerosis of coronary artery bypass graft(s), unspecified, with unstable angina pectoris: Secondary | ICD-10-CM | POA: Diagnosis not present

## 2015-01-24 DIAGNOSIS — Z01818 Encounter for other preprocedural examination: Secondary | ICD-10-CM

## 2015-01-24 DIAGNOSIS — Z951 Presence of aortocoronary bypass graft: Secondary | ICD-10-CM | POA: Insufficient documentation

## 2015-01-24 DIAGNOSIS — Z79899 Other long term (current) drug therapy: Secondary | ICD-10-CM | POA: Diagnosis not present

## 2015-01-24 DIAGNOSIS — I25110 Atherosclerotic heart disease of native coronary artery with unstable angina pectoris: Secondary | ICD-10-CM | POA: Diagnosis not present

## 2015-01-24 DIAGNOSIS — N183 Chronic kidney disease, stage 3 (moderate): Secondary | ICD-10-CM | POA: Insufficient documentation

## 2015-01-24 DIAGNOSIS — I10 Essential (primary) hypertension: Secondary | ICD-10-CM | POA: Diagnosis present

## 2015-01-24 DIAGNOSIS — I2582 Chronic total occlusion of coronary artery: Secondary | ICD-10-CM | POA: Insufficient documentation

## 2015-01-24 DIAGNOSIS — I63411 Cerebral infarction due to embolism of right middle cerebral artery: Secondary | ICD-10-CM | POA: Diagnosis present

## 2015-01-24 DIAGNOSIS — Z955 Presence of coronary angioplasty implant and graft: Secondary | ICD-10-CM | POA: Diagnosis not present

## 2015-01-24 HISTORY — PX: CORONARY ANGIOPLASTY WITH STENT PLACEMENT: SHX49

## 2015-01-24 HISTORY — DX: Unspecified osteoarthritis, unspecified site: M19.90

## 2015-01-24 HISTORY — PX: CARDIAC CATHETERIZATION: SHX172

## 2015-01-24 HISTORY — DX: Cardiac murmur, unspecified: R01.1

## 2015-01-24 HISTORY — DX: Chronic kidney disease, stage 2 (mild): N18.2

## 2015-01-24 HISTORY — DX: Personal history of other diseases of the digestive system: Z87.19

## 2015-01-24 HISTORY — DX: Gastro-esophageal reflux disease without esophagitis: K21.9

## 2015-01-24 HISTORY — DX: Paroxysmal atrial fibrillation: I48.0

## 2015-01-24 LAB — PROTIME-INR
INR: 1.16 (ref 0.00–1.49)
Prothrombin Time: 14.9 seconds (ref 11.6–15.2)

## 2015-01-24 LAB — POCT ACTIVATED CLOTTING TIME: ACTIVATED CLOTTING TIME: 435 s

## 2015-01-24 SURGERY — LEFT HEART CATH AND CORONARY ANGIOGRAPHY
Anesthesia: LOCAL

## 2015-01-24 MED ORDER — SODIUM CHLORIDE 0.9 % IJ SOLN: 3.0000 mL | INTRAMUSCULAR | Status: DC | PRN

## 2015-01-24 MED ORDER — SODIUM CHLORIDE 0.9 % IV SOLN: 250.0000 mL | INTRAVENOUS | Status: DC | PRN

## 2015-01-24 MED ORDER — LIDOCAINE HCL (PF) 1 % IJ SOLN
INTRAMUSCULAR | Status: DC | PRN
  Administered 2015-01-24: 12:00:00

## 2015-01-24 MED ORDER — AMLODIPINE BESYLATE 5 MG PO TABS
5.0000 mg | ORAL_TABLET | Freq: Every day | ORAL | Status: DC
  Administered 2015-01-25: 5 mg via ORAL
  Filled 2015-01-24: qty 1

## 2015-01-24 MED ORDER — CLOPIDOGREL BISULFATE 75 MG PO TABS
ORAL_TABLET | ORAL | Status: DC | PRN
  Administered 2015-01-24: 600 mg via ORAL

## 2015-01-24 MED ORDER — CLOPIDOGREL BISULFATE 75 MG PO TABS
75.0000 mg | ORAL_TABLET | Freq: Every day | ORAL | Status: DC
  Administered 2015-01-25: 08:00:00 75 mg via ORAL
  Filled 2015-01-24: qty 1

## 2015-01-24 MED ORDER — MIDAZOLAM HCL 2 MG/2ML IJ SOLN
INTRAMUSCULAR | Status: AC
  Filled 2015-01-24: qty 4

## 2015-01-24 MED ORDER — SODIUM CHLORIDE 0.9 % IJ SOLN: 3.0000 mL | Freq: Two times a day (BID) | INTRAMUSCULAR | Status: DC

## 2015-01-24 MED ORDER — FAMOTIDINE 20 MG PO TABS
20.0000 mg | ORAL_TABLET | Freq: Two times a day (BID) | ORAL | Status: DC
Start: 2015-01-24 — End: 2015-01-25
  Administered 2015-01-24 – 2015-01-25 (×2): 20 mg via ORAL
  Filled 2015-01-24 (×3): qty 1

## 2015-01-24 MED ORDER — ASPIRIN 81 MG PO CHEW
CHEWABLE_TABLET | ORAL | Status: AC
  Filled 2015-01-24: qty 1

## 2015-01-24 MED ORDER — FENTANYL CITRATE (PF) 100 MCG/2ML IJ SOLN
INTRAMUSCULAR | Status: AC
  Filled 2015-01-24: qty 4

## 2015-01-24 MED ORDER — IOHEXOL 350 MG/ML SOLN
INTRAVENOUS | Status: DC | PRN
  Administered 2015-01-24: 165 mL via INTRACARDIAC

## 2015-01-24 MED ORDER — SODIUM CHLORIDE 0.9 % IV SOLN
250.0000 mg | INTRAVENOUS | Status: DC | PRN
  Administered 2015-01-24: 1.75 mg/kg/h via INTRAVENOUS

## 2015-01-24 MED ORDER — BIVALIRUDIN 250 MG IV SOLR
INTRAVENOUS | Status: AC
  Filled 2015-01-24: qty 250

## 2015-01-24 MED ORDER — LIDOCAINE HCL (PF) 1 % IJ SOLN
INTRAMUSCULAR | Status: AC
  Filled 2015-01-24: qty 30

## 2015-01-24 MED ORDER — HEPARIN SODIUM (PORCINE) 1000 UNIT/ML IJ SOLN
INTRAMUSCULAR | Status: DC | PRN
  Administered 2015-01-24: 3500 [IU] via INTRAVENOUS

## 2015-01-24 MED ORDER — ASPIRIN 81 MG PO CHEW
81.0000 mg | CHEWABLE_TABLET | ORAL | Status: AC
  Administered 2015-01-24: 81 mg via ORAL

## 2015-01-24 MED ORDER — BIVALIRUDIN BOLUS VIA INFUSION - CUPID
INTRAVENOUS | Status: DC | PRN
  Administered 2015-01-24: 58.2 mg via INTRAVENOUS

## 2015-01-24 MED ORDER — SODIUM CHLORIDE 0.9 % IV SOLN
INTRAVENOUS | Status: DC
  Administered 2015-01-24: 10:00:00 via INTRAVENOUS

## 2015-01-24 MED ORDER — VERAPAMIL HCL 2.5 MG/ML IV SOLN
INTRAVENOUS | Status: AC
  Filled 2015-01-24: qty 2

## 2015-01-24 MED ORDER — SODIUM CHLORIDE 0.9 % IJ SOLN
3.0000 mL | Freq: Two times a day (BID) | INTRAMUSCULAR | Status: DC
Start: 2015-01-24 — End: 2015-01-24

## 2015-01-24 MED ORDER — ACETAMINOPHEN 325 MG PO TABS: 650.0000 mg | ORAL_TABLET | ORAL | Status: DC | PRN

## 2015-01-24 MED ORDER — METOPROLOL TARTRATE 25 MG PO TABS
25.0000 mg | ORAL_TABLET | Freq: Two times a day (BID) | ORAL | Status: DC
  Administered 2015-01-24 – 2015-01-25 (×2): 25 mg via ORAL
  Filled 2015-01-24 (×2): qty 1

## 2015-01-24 MED ORDER — ASPIRIN 81 MG PO CHEW: 81.0000 mg | CHEWABLE_TABLET | Freq: Every day | ORAL | Status: DC

## 2015-01-24 MED ORDER — MIDAZOLAM HCL 2 MG/2ML IJ SOLN
INTRAMUSCULAR | Status: DC | PRN
  Administered 2015-01-24: 1 mg via INTRAVENOUS

## 2015-01-24 MED ORDER — CLOPIDOGREL BISULFATE 300 MG PO TABS
ORAL_TABLET | ORAL | Status: AC
  Filled 2015-01-24: qty 1

## 2015-01-24 MED ORDER — FENTANYL CITRATE (PF) 100 MCG/2ML IJ SOLN
INTRAMUSCULAR | Status: DC | PRN
  Administered 2015-01-24: 25 ug via INTRAVENOUS

## 2015-01-24 MED ORDER — BENAZEPRIL HCL 20 MG PO TABS
40.0000 mg | ORAL_TABLET | Freq: Every day | ORAL | Status: DC
  Administered 2015-01-25: 10:00:00 40 mg via ORAL
  Filled 2015-01-24: qty 2

## 2015-01-24 MED ORDER — ONDANSETRON HCL 4 MG/2ML IJ SOLN: 4.0000 mg | Freq: Four times a day (QID) | INTRAMUSCULAR | Status: DC | PRN

## 2015-01-24 MED ORDER — NITROGLYCERIN 1 MG/10 ML FOR IR/CATH LAB
INTRA_ARTERIAL | Status: AC
  Filled 2015-01-24: qty 10

## 2015-01-24 MED ORDER — SODIUM CHLORIDE 0.9 % WEIGHT BASED INFUSION: 1.0000 mL/kg/h | INTRAVENOUS | Status: DC

## 2015-01-24 MED ORDER — ROSUVASTATIN CALCIUM 20 MG PO TABS
20.0000 mg | ORAL_TABLET | Freq: Every day | ORAL | Status: DC
  Administered 2015-01-24: 20 mg via ORAL
  Filled 2015-01-24: qty 1

## 2015-01-24 MED ORDER — HEPARIN (PORCINE) IN NACL 2-0.9 UNIT/ML-% IJ SOLN
INTRAMUSCULAR | Status: AC
  Filled 2015-01-24: qty 1000

## 2015-01-24 MED ORDER — SODIUM CHLORIDE 0.9 % IV SOLN
1.7500 mg/kg/h | INTRAVENOUS | Status: DC
  Filled 2015-01-24 (×2): qty 250

## 2015-01-24 MED ORDER — VERAPAMIL HCL 2.5 MG/ML IV SOLN
INTRAVENOUS | Status: DC | PRN
  Administered 2015-01-24: 10:00:00 via INTRA_ARTERIAL

## 2015-01-24 SURGICAL SUPPLY — 30 items
BALLN EUPHORA RX 2.0X15 (BALLOONS) ×2
BALLN EUPHORA RX 3.0X15 (BALLOONS) ×2
BALLN ~~LOC~~ EUPHORA RX 4.0X12 (BALLOONS) ×2
BALLOON EUPHORA RX 2.0X15 (BALLOONS) IMPLANT
BALLOON EUPHORA RX 3.0X15 (BALLOONS) IMPLANT
BALLOON ~~LOC~~ EUPHORA RX 4.0X12 (BALLOONS) ×1 IMPLANT
CATH INFINITI 5 FR IM (CATHETERS) ×1 IMPLANT
CATH INFINITI 5 FR JL3.5 (CATHETERS) ×1 IMPLANT
CATH INFINITI 5 FR LCB (CATHETERS) ×1 IMPLANT
CATH INFINITI 5 FR RCB (CATHETERS) ×1 IMPLANT
CATH INFINITI 5FR ANG PIGTAIL (CATHETERS) ×1 IMPLANT
CATH INFINITI 5FR MULTPACK ANG (CATHETERS) ×1 IMPLANT
CATH INFINITI JR4 5F (CATHETERS) ×1 IMPLANT
CATH VISTA GUIDE 6FR JR4 (CATHETERS) ×1 IMPLANT
DEVICE RAD COMP TR BAND LRG (VASCULAR PRODUCTS) ×2 IMPLANT
GLIDESHEATH SLEND SS 6F .021 (SHEATH) ×2 IMPLANT
GUIDELINER 6F (CATHETERS) ×1 IMPLANT
KIT ENCORE 26 ADVANTAGE (KITS) ×1 IMPLANT
KIT HEART LEFT (KITS) ×2 IMPLANT
PACK CARDIAC CATHETERIZATION (CUSTOM PROCEDURE TRAY) ×2 IMPLANT
SHEATH PINNACLE 5F 10CM (SHEATH) IMPLANT
STENT SYNERGY DES 3.5X20 (Permanent Stent) ×1 IMPLANT
STENT SYNERGY DES 3.5X28 (Permanent Stent) IMPLANT
SYR MEDRAD MARK V 150ML (SYRINGE) ×2 IMPLANT
TRANSDUCER W/STOPCOCK (MISCELLANEOUS) ×2 IMPLANT
TUBING CIL FLEX 10 FLL-RA (TUBING) ×2 IMPLANT
WIRE ASAHI PROWATER 180CM (WIRE) ×1 IMPLANT
WIRE EMERALD 3MM-J .035X150CM (WIRE) IMPLANT
WIRE HI TORQ BMW 190CM (WIRE) ×1 IMPLANT
WIRE SAFE-T 1.5MM-J .035X260CM (WIRE) ×2 IMPLANT

## 2015-01-24 NOTE — Progress Notes (Signed)
TR BAND REMOVAL  LOCATION:    left radial  DEFLATED PER PROTOCOL:    Yes.    TIME BAND OFF / DRESSING APPLIED:    1600   SITE UPON ARRIVAL:    Level 0  SITE AFTER BAND REMOVAL:    Level 0  CIRCULATION SENSATION AND MOVEMENT:    Within Normal Limits   Yes.    COMMENTS:   Rechecked at 1630 and frequently during the remainder of shift with no change in assessment.

## 2015-01-24 NOTE — Interval H&P Note (Signed)
History and Physical Interval Note:  01/24/2015 9:58 AM  Arthur [name]  has presented today for surgery, with the diagnosis of abnormal nuclear stress test  The various methods of treatment have been discussed with the patient and family. After consideration of risks, benefits and other options for treatment, the patient has consented to  Procedure(s): Left Heart Cath and Coronary Angiography (N/A) as a surgical intervention .  The patient's history has been reviewed, patient examined, no change in status, stable for surgery.  I have reviewed the patient's chart and labs.  Questions were answered to the patient's satisfaction.   Cath Lab Visit (complete for each Cath Lab visit)  Clinical Evaluation Leading to the Procedure:   ACS: No.  Non-ACS:    Anginal Classification: CCS II  Anti-ischemic medical therapy: Maximal Therapy (2 or more classes of medications)  Non-Invasive Test Results: Intermediate-risk stress test findings: cardiac mortality 1-3%/year  Prior CABG: Previous CABG        [name] [facility] 01/24/2015 9:58 AM

## 2015-01-24 NOTE — H&P (View-Only) (Signed)
Patient ID: Arthur [name], male   DOB: Oct 29, 1928, 81 y.o.   MRN: [id number]    Date:  01/13/2015   ID:  Arthur [name], DOB 1928-07-06, MRN [id number]  PCP:  Gennette Pac, MD  Primary Cardiologist:  Martinique   Chief Complaint  Patient presents with  . Follow-up    pt states no chest pain, SOB, or swelling     History of Present Illness: Arthur [name] is a 81 y.o. male with a history of coronary disease and is status post CABG in 2005. He had mitral valve repair and a Maze procedure at that time. He subsequently developed recurrent CVA and TEE demonstrated persistent left atrial appendage thrombus despite a history of previous ligation of the appendage. He has been on chronic anticoagulation.   He presented to the ED in April with a near syncopal episode. He was out picking up limbs in his yard. While bending over he slumped to the ground. He came to quickly and felt OK afterwards. In the ED Ecg did show new Afib with rate 86 bpm. No other acute findings. Since then he has had a couple of episodes of feeling funny- BP elevated at those times. He also notes symptoms of mid sternal chest tightness and indigestion with exertion that is new.   Patient underwent nuclear stress test in 01/03/2015 revealed a normal ejection fraction however there was a moderate sized, severe intensity, reversible inferior defect consistent with moderate inferior ischemia. Patient came in to discuss the study.  At this time doesn't this sound as though he is describing any anginal symptoms however, we discussed left heart catheterization.  The patient currently denies nausea, vomiting, fever, chest pain, shortness of breath, orthopnea, dizziness, PND, cough, congestion, abdominal pain, hematochezia, melena, lower extremity edema, claudication.  Wt Readings from Last 3 Encounters:  01/13/15 171 lb 3.2 oz (77.656 kg)  12/20/14 172 lb 3.2 oz (78.109 kg)  10/07/14 172 lb (78.019 kg)     Past Medical History    Diagnosis Date  . Hyperlipidemia   . Hypertension   . Coronary artery disease     3-vessel coronary artery disease  . MVP (mitral valve prolapse)   . Atrial fibrillation   . Chronic renal insufficiency   . History of recurrent TIAs   . Hypertensive vascular disease   . CVA (cerebrovascular accident)     RIGHT BRAIN  . Odynophagia   . Esophagitis     Distal esophagitis  . Nephrolithiasis   . Shortness of breath   . Transudative pleural effusion     Current Outpatient Prescriptions  Medication Sig Dispense Refill  . amLODipine (NORVASC) 5 MG tablet TAKE 1 TABLET (5 MG TOTAL) BY MOUTH DAILY. 90 tablet 2  . benazepril (LOTENSIN) 40 MG tablet TAKE 1 TABLET (40 MG TOTAL) BY MOUTH DAILY. 90 tablet 0  . CRESTOR 20 MG tablet TAKE 1 TABLET (20 MG TOTAL) BY MOUTH DAILY. 30 tablet 6  . metoprolol tartrate (LOPRESSOR) 25 MG tablet TAKE 1 TABLET (25 MG TOTAL) BY MOUTH 2 (TWO) TIMES DAILY WITH FOOD. 60 tablet 9  . ranitidine (ZANTAC) 150 MG tablet TAKE 1 TABLET (150 MG TOTAL) BY MOUTH 2 (TWO) TIMES DAILY. 60 tablet 9  . warfarin (COUMADIN) 2.5 MG tablet [name] as directed by anticoagulation clinic 30 tablet 11   No current facility-administered medications for this visit.    Allergies:    Allergies  Allergen Reactions  . Lasix [Furosemide] Rash  . Penicillins Rash  .  Sulfa Drugs Cross Reactors Rash    Social History:  The patient  reports that he quit smoking about 59 years ago. He does not have any smokeless tobacco history on file. He reports that he does not drink alcohol or use illicit drugs.   Family history:   Family History  Problem Relation Age of Onset  . Heart attack Mother   . Stroke Father   . Hypertension Father   . Kidney failure Father     ROS:  Please see the history of present illness.  All other systems reviewed and negative.   PHYSICAL EXAM: VS:  BP 132/64 mmHg  Pulse 70  Ht 5' 5.25" (1.657 m)  Wt 171 lb 3.2 oz (77.656 kg)  BMI 28.28 kg/m2 Well  nourished, well developed, in no acute distress HEENT: Pupils are equal round react to light accommodation extraocular movements are intact.  Neck: no JVDNo cervical lymphadenopathy. Cardiac: Regular rate and rhythm without murmurs rubs or gallops. Lungs:  clear to auscultation bilaterally, no wheezing, rhonchi or rales Abd: soft, nontender, positive bowel sounds all quadrants, no hepatosplenomegaly Ext: no lower extremity edema.  2+ radial and dorsalis pedis pulses. Skin: warm and dry Neuro:  Grossly normal    ASSESSMENT AND PLAN:  Problem List Items Addressed This Visit    Long term current use of anticoagulant    Will need Coumadin to Lovenox bridging      Hypertension    Blood pressure controlled. No changes in medical therapy      Hyperlipidemia    Continue statin      Coronary artery disease    Status post abnormal nuclear stress test which is considered intermediate risk.  We discussed the risks of cardiac catheterization including, but are not limited to bleeding, infection, vascular injury, stroke, myocardial infection, arrhythmia, kidney injury, radiation-related injury in the case of prolonged fluoroscopy use, emergency cardiac surgery, and death. The patient understands the risks of serious complication is low (<9%).   Patient will follow up with Tommy Medal for Coumadin to Lovenox bridging.  He was scheduled with Dr. Martinique with procedure which I do not think is urgent at this time. He is on a beta blocker.       Other Visit Diagnoses    Abnormal stress test    -  Primary    Relevant Orders    LEFT HEART CATH    Preop examination        Relevant Orders    APTT    Basic metabolic panel    CBC    DG Chest 2 View (Completed)    Blood clotting disorder        Relevant Orders    Protime-INR

## 2015-01-25 ENCOUNTER — Encounter (HOSPITAL_COMMUNITY): Payer: Self-pay | Admitting: Physician Assistant

## 2015-01-25 DIAGNOSIS — R931 Abnormal findings on diagnostic imaging of heart and coronary circulation: Secondary | ICD-10-CM

## 2015-01-25 DIAGNOSIS — I25700 Atherosclerosis of coronary artery bypass graft(s), unspecified, with unstable angina pectoris: Secondary | ICD-10-CM | POA: Diagnosis not present

## 2015-01-25 DIAGNOSIS — I480 Paroxysmal atrial fibrillation: Secondary | ICD-10-CM

## 2015-01-25 DIAGNOSIS — I2582 Chronic total occlusion of coronary artery: Secondary | ICD-10-CM | POA: Diagnosis not present

## 2015-01-25 DIAGNOSIS — I25110 Atherosclerotic heart disease of native coronary artery with unstable angina pectoris: Secondary | ICD-10-CM | POA: Diagnosis not present

## 2015-01-25 DIAGNOSIS — I2584 Coronary atherosclerosis due to calcified coronary lesion: Secondary | ICD-10-CM | POA: Diagnosis not present

## 2015-01-25 LAB — BASIC METABOLIC PANEL
ANION GAP: 6 (ref 5–15)
BUN: 17 mg/dL (ref 6–20)
CO2: 23 mmol/L (ref 22–32)
Calcium: 8.7 mg/dL — ABNORMAL LOW (ref 8.9–10.3)
Chloride: 108 mmol/L (ref 101–111)
Creatinine, Ser: 1.22 mg/dL (ref 0.61–1.24)
GFR calc Af Amer: >60 mL/min (ref >60)
GFR, EST NON AFRICAN AMERICAN: 53 mL/min — AB (ref >60)
Glucose, Bld: 93 mg/dL (ref 65–99)
Potassium: 3.7 mmol/L (ref 3.5–5.1)
Sodium: 137 mmol/L (ref 135–145)

## 2015-01-25 LAB — CBC
HCT: 40.6 % (ref 39.0–52.0)
HEMOGLOBIN: 13.5 g/dL (ref 13.0–17.0)
MCH: 29.5 pg (ref 26.0–34.0)
MCHC: 33.3 g/dL (ref 30.0–36.0)
MCV: 88.8 fL (ref 78.0–100.0)
PLATELETS: 182 10*3/uL (ref 150–400)
RBC: 4.57 MIL/uL (ref 4.22–5.81)
RDW: 14 % (ref 11.5–15.5)
WBC: 7 10*3/uL (ref 4.0–10.5)

## 2015-01-25 MED ORDER — CLOPIDOGREL BISULFATE 75 MG PO TABS: 75.0000 mg | ORAL_TABLET | Freq: Every day | ORAL | Status: DC

## 2015-01-25 MED ORDER — ASPIRIN EC 81 MG PO TBEC
81.0000 mg | DELAYED_RELEASE_TABLET | Freq: Every day | ORAL | Status: DC
  Administered 2015-01-25: 81 mg via ORAL
  Filled 2015-01-25: qty 1

## 2015-01-25 MED ORDER — ASPIRIN 81 MG PO TBEC: 81.0000 mg | DELAYED_RELEASE_TABLET | Freq: Every day | ORAL | Status: DC

## 2015-01-25 MED ORDER — NITROGLYCERIN 0.4 MG SL SUBL: 0.4000 mg | SUBLINGUAL_TABLET | SUBLINGUAL | Status: DC | PRN

## 2015-01-25 NOTE — Progress Notes (Signed)
    SUBJECTIVE:  No chest pain.  No SOB   PHYSICAL EXAM Filed Vitals:   01/24/15 2000 01/24/15 2015 01/25/15 0032 01/25/15 0419  BP: 119/74 121/76 119/70 141/80  Pulse:   58 63  Temp:   98.6 F (37 C) 97.9 F (36.6 C)  TempSrc:   Oral Oral  Resp: 14 16 20 19   Height:      Weight:   175 lb 11.3 oz (79.7 kg)   SpO2: 97% 97% 96% 98%   General:  No distress Lungs:  Clear Heart:  RRR Abdomen:  Positive bowel sounds, no rebound no guarding Extremities:  Left wrist without bleeding or bruising  LABS:  Results for orders placed or performed during the hospital encounter of 01/24/15 (from the past 24 hour(s))  Protime-INR     Status: None   Collection Time: 01/24/15  9:15 AM  Result Value Ref Range   Prothrombin Time 14.9 11.6 - 15.2 seconds   INR 1.16 0.00 - 1.49  POCT Activated clotting time     Status: None   Collection Time: 01/24/15 10:53 AM  Result Value Ref Range   Activated Clotting Time 435 seconds  Basic metabolic panel     Status: Abnormal   Collection Time: 01/25/15  3:36 AM  Result Value Ref Range   Sodium 137 135 - 145 mmol/L   Potassium 3.7 3.5 - 5.1 mmol/L   Chloride 108 101 - 111 mmol/L   CO2 23 22 - 32 mmol/L   Glucose, Bld 93 65 - 99 mg/dL   BUN 17 6 - 20 mg/dL   Creatinine, Ser 1.22 0.61 - 1.24 mg/dL   Calcium 8.7 (L) 8.9 - 10.3 mg/dL   GFR calc non Af Amer 53 (L) >60 mL/min   GFR calc Af Amer >60 >60 mL/min   Anion gap 6 5 - 15  CBC     Status: None   Collection Time: 01/25/15  3:36 AM  Result Value Ref Range   WBC 7.0 4.0 - 10.5 K/uL   RBC 4.57 4.22 - 5.81 MIL/uL   Hemoglobin 13.5 13.0 - 17.0 g/dL   HCT 40.6 39.0 - 52.0 %   MCV 88.8 78.0 - 100.0 fL   MCH 29.5 26.0 - 34.0 pg   MCHC 33.3 30.0 - 36.0 g/dL   RDW 14.0 11.5 - 15.5 %   Platelets 182 150 - 400 K/uL    Intake/Output Summary (Last 24 hours) at 01/25/15 0803 Last data filed at 01/25/15 0420  Gross per 24 hour  Intake 1018.3 ml  Output    825 ml  Net  193.3 ml    EKG:  NSR,  rate 62, axis WNL, old anterior MI.  Low voltage limb and chest leads.  01/25/2015  ASSESSMENT AND PLAN:  CAD/PCI:  Treat with DAPT with ASA and Plavix. Resume coumadin today without bridging. Target INR 2-2.5. Would plan to DC ASA after one month. Will treat residual disease in PDA medically.   Needs to follow up next week in Coumadin clinic.   ATRIAL FIB:  As above.     Jeneen Rinks [facility] 01/25/2015 8:03 AM

## 2015-01-25 NOTE — Progress Notes (Signed)
CARDIAC REHAB PHASE I   PRE:  Rate/Rhythm: 78 SR  BP:  Supine:   Sitting: 150/56  Standing:    SaO2: 97% RA  MODE:  Ambulation: 600 ft   POST:  Rate/Rhythm: 80 w/ PVCs  BP:  Supine:   Sitting: 126/76  Standing:    SaO2: 98% RA  0900-1000 Pt tolerated ambulation well with assist x1, gait steady, no c/o. PCI/stent education completed including restrictions, risk factor modification, Plavix, use, CP, NTG use, & calling 911, heart healthy diet & exercise guidelines given. Pt very receptive & verbalizes understanding to instructions given. Discussed Phase 2 cardiac rehab and referral sent to cardiac rehab program at [facility].  Arthur [name]

## 2015-01-25 NOTE — Discharge Summary (Signed)
Discharge Summary   Patient ID: Arthur [name] MRN: [id number], DOB/AGE: 1928-11-25 81 y.o. Admit date: 01/24/2015 D/C date:     01/25/2015  Primary Care Provider: Gennette Pac, MD Primary Cardiologist: Dr. Martinique  Primary Discharge Diagnoses:  1. CAD/unstable angina - h/o CABG 2005 with MV repair and MAZE procedure - this admit: recent abnormal nuc -> s/p DES to prox RCA 01/2015 2. Paroxysmal atrial fibrillation 3. Hypertension 4. Hyperlipidemia  PMH:  Past Medical History  Diagnosis Date  . Hyperlipidemia   . Hypertension   . Coronary artery disease     a. s/p CABG in 2005 with MV repair and MAZE procedure. b. s/p DES to prox RCA 01/2015.   Marland Kitchen MVP (mitral valve prolapse)   . PAF (paroxysmal atrial fibrillation)   . History of recurrent TIAs   . Hypertensive vascular disease   . Odynophagia   . Esophagitis     Distal esophagitis  . Transudative pleural effusion   . Heart murmur   . GERD (gastroesophageal reflux disease)   . History of hiatal hernia   . CVA (cerebrovascular accident) ~ 2014    RIGHT BRAIN; denies residual on 01/24/2015  . Arthritis     "some in my joints" (01/24/2015)  . CKD (chronic kidney disease), stage II   . Nephrolithiasis      Hospital Course: Arthur [name] is an 81 y/o M with history of CAD s/p CABG 2005 with MV repair and MAZE procedure at that time, HTN, HLD, PAF, CKD stage II-III who presented to [facility] for planned cath. After his cardiac surgery in 2005 he subsequently developed recurrent CVA and TEE demonstrated persistent left atrial appendage thrombus despite a history of previous ligation of the appendage. He has been on chronic anticoagulation. In April he suffered a new syncopal spell and was found to be in new atrial fibrillation with controlled rate. He had noted intermittent episodes of mid-sternal chest tightness and "indigestion" with exertion. He underwent nuclear stress test in 01/03/2015 revealed a normal ejection  fraction however there was a moderate sized, severe intensity, reversible inferior defect consistent with moderate inferior ischemia. Coumadin was held with Lovenox bridging and he was brought in for cardiac cath 01/24/15 which showed severe 3v obstructive CAD with patent LIMA-LAD, SVG-diag, SVG-OM3 and occluded SVG-PDA. He had successful stenting of prox RCA with a DES. Dr. Martinique recommended to treat with DAPT with ASA and Plavix, and resume Coumadin today without bridging (target INR 2-2.5), and D/C aspirin after 1 month. He recommends to treat residual disease in PDA medically. The patient feels well today. Dr. Percival Spanish has seen and examined the patient today and feels he is stable for discharge. I discussed Coumadin dosing with pharmacy who recommended to give 2.5mg  of coumadin today and tomorrow, then resume a schedule of 2.5mg  T/Thur/Sat and 1.25mg  on all other days. He has Coumadin f/u on 01/29/15. I have sent a message to our [facility] office's scheduler requesting a follow-up appointment, and our office will call the patient with this information.   Discharge Vitals: Blood pressure 150/56, pulse 80, temperature 97.8 F (36.6 C), temperature source Oral, resp. rate 18, height 5' 5.5" (1.664 m), weight 175 lb 11.3 oz (79.7 kg), SpO2 98 %.  Labs: Lab Results  Component Value Date   WBC 7.0 01/25/2015   HGB 13.5 01/25/2015   HCT 40.6 01/25/2015   MCV 88.8 01/25/2015   PLT 182 01/25/2015     Recent Labs Lab 01/25/15 0336  NA  137  K 3.7  CL 108  CO2 23  BUN 17  CREATININE 1.22  CALCIUM 8.7*  GLUCOSE 93    Lab Results  Component Value Date   CHOL 158 06/17/2014   HDL 43.20 06/17/2014   LDLCALC 94 06/17/2014   TRIG 103.0 06/17/2014   No results found for: DDIMER  Diagnostic Studies/Procedures   Dg Chest 2 View  01/13/2015   CLINICAL DATA:  Preoperative examination prior to cardiac catheterization, history of atrial fibrillation, coronary artery disease, remote history of  tobacco use.  EXAM: CHEST  2 VIEW  COMPARISON:  Chest x-ray of November 29th 2012  FINDINGS: There is stable right basilar atelectasis and scarring with pleural thickening. The left lung is clear. The heart and pulmonary vascularity are normal. The mediastinum is normal in width. There is a prosthetic mitral valve ring visible. There are 8 intact sternal wires. There is old moderate-sized hiatal hernia. There is mild multilevel degenerative disc disease and gentle dextrocurvature of the lumbar spine visible at the margin of the study.  IMPRESSION: Chronic changes at the right lung base. There is no pneumonia, CHF, nor other acute cardiopulmonary abnormality. There is a moderate-sized hiatal hernia which may place the patient at increased risk for aspiration.   Electronically Signed   By: David  Martinique M.D.   On: 01/13/2015 12:45    Cardiac catheterization this admission, please see full report and above for summary.   Discharge Medications   Current Discharge Medication List    START taking these medications   Details  aspirin EC 81 MG EC tablet Take 1 tablet (81 mg total) by mouth daily. FOR ONE MONTH ONLY then STOP. Qty: 30 tablet, Refills: 0    clopidogrel (PLAVIX) 75 MG tablet Take 1 tablet (75 mg total) by mouth daily. Qty: 30 tablet, Refills: 11    nitroGLYCERIN (NITROSTAT) 0.4 MG SL tablet Place 1 tablet (0.4 mg total) under the tongue every 5 (five) minutes as needed for chest pain (up to 3 doses). Qty: 25 tablet, Refills: 3      CONTINUE these medications which have NOT CHANGED   Details  amLODipine (NORVASC) 5 MG tablet TAKE 1 TABLET (5 MG TOTAL) BY MOUTH DAILY.     benazepril (LOTENSIN) 40 MG tablet TAKE 1 TABLET (40 MG TOTAL) BY MOUTH DAILY.     CRESTOR 20 MG tablet TAKE 1 TABLET (20 MG TOTAL) BY MOUTH DAILY.     metoprolol tartrate (LOPRESSOR) 25 MG tablet TAKE 1 TABLET (25 MG TOTAL) BY MOUTH 2 (TWO) TIMES DAILY WITH FOOD.     ranitidine (ZANTAC) 150 MG tablet TAKE 1  TABLET (150 MG TOTAL) BY MOUTH 2 (TWO) TIMES DAILY.     warfarin (COUMADIN) 2.5 MG tablet [name] as directed by anticoagulation clinic. Med details added to AVS: Notes to Patient: For your Coumadin you should take 1 tablet today (01/25/15), 1 tablet tomorrow (01/26/15), then on Monday (01/27/15), resume usual schedule of 1/2 tablet daily except 1 tablet on Tuesdays, Thursdays and Saturdays.        STOP taking these medications     enoxaparin (LOVENOX) 80 MG/0.8ML injection         Disposition   The patient will be discharged in stable condition to home. Discharge Instructions    Diet - low sodium heart healthy    Complete by:  As directed      Increase activity slowly    Complete by:  As directed   No driving for 2  days. No lifting over 5 lbs for 1 week. No sexual activity for 1 week. Keep procedure site clean & dry. If you notice increased pain, swelling, bleeding or pus, call/return!  You may shower, but no soaking baths/hot tubs/pools for 1 week.   New medicines include aspirin, Plavix (clopidogrel), and nitroglycerin. Please STOP aspirin after 1 MONTH. Do not take any more Lovenox/enoxaparin injections. For your Coumadin you should take 1 tablet today (01/25/15), 1 tablet tomorrow (01/26/15), then on Monday (01/27/15), resume usual schedule of 1/2 tablet daily except 1 tablet on Tuesdays, Thursdays and Saturdays.          Follow-up Information    Follow up with [facility].   Specialty:  Cardiology   Why:  01/29/15 at 10:10am for Coumadin Clinic check   Contact information:   [address] [place] [phone]      Follow up with Peter Martinique, MD.   Specialty:  Cardiology   Why:  Office will call you for your followup appointment. Call office if you have not heard back in 3 days.   Contact information:   [place] [address] [phone]         Duration of Discharge Encounter: Greater than  30 minutes including physician and PA time.  Raechel Ache PA-C 01/25/2015, 9:22 AM  Patient seen and examined.  Plan as discussed in my rounding note for today and outlined above. Minus Breeding  01/25/2015  9:38 AM

## 2015-01-27 MED FILL — Nitroglycerin IV Soln 100 MCG/ML in D5W: INTRA_ARTERIAL | Qty: 10 | Status: AC

## 2015-01-27 MED FILL — Clopidogrel Bisulfate Tab 300 MG (Base Equiv): ORAL | Qty: 2 | Status: AC

## 2015-01-29 ENCOUNTER — Ambulatory Visit (INDEPENDENT_AMBULATORY_CARE_PROVIDER_SITE_OTHER): Payer: Medicare Other | Admitting: *Deleted

## 2015-01-29 DIAGNOSIS — I341 Nonrheumatic mitral (valve) prolapse: Secondary | ICD-10-CM | POA: Diagnosis not present

## 2015-01-29 DIAGNOSIS — Z954 Presence of other heart-valve replacement: Secondary | ICD-10-CM

## 2015-01-29 DIAGNOSIS — Z7901 Long term (current) use of anticoagulants: Secondary | ICD-10-CM | POA: Diagnosis not present

## 2015-01-29 DIAGNOSIS — Z952 Presence of prosthetic heart valve: Secondary | ICD-10-CM

## 2015-01-29 DIAGNOSIS — Z5181 Encounter for therapeutic drug level monitoring: Secondary | ICD-10-CM

## 2015-01-29 LAB — POCT INR: INR: 1.3

## 2015-01-30 ENCOUNTER — Ambulatory Visit: Payer: Medicare Other | Admitting: Pharmacist Clinician (PhC)/ Clinical Pharmacy Specialist

## 2015-02-05 ENCOUNTER — Ambulatory Visit: Payer: Medicare Other | Admitting: Pharmacist Clinician (PhC)/ Clinical Pharmacy Specialist

## 2015-02-06 ENCOUNTER — Ambulatory Visit: Payer: Medicare Other | Admitting: Pharmacist Clinician (PhC)/ Clinical Pharmacy Specialist

## 2015-02-06 ENCOUNTER — Ambulatory Visit (INDEPENDENT_AMBULATORY_CARE_PROVIDER_SITE_OTHER): Payer: Medicare Other | Admitting: Pharmacist Clinician (PhC)/ Clinical Pharmacy Specialist

## 2015-02-06 DIAGNOSIS — I341 Nonrheumatic mitral (valve) prolapse: Secondary | ICD-10-CM | POA: Diagnosis not present

## 2015-02-06 DIAGNOSIS — Z7901 Long term (current) use of anticoagulants: Secondary | ICD-10-CM

## 2015-02-06 DIAGNOSIS — Z954 Presence of other heart-valve replacement: Secondary | ICD-10-CM

## 2015-02-06 DIAGNOSIS — Z5181 Encounter for therapeutic drug level monitoring: Secondary | ICD-10-CM | POA: Diagnosis not present

## 2015-02-06 DIAGNOSIS — Z952 Presence of prosthetic heart valve: Secondary | ICD-10-CM

## 2015-02-06 LAB — POCT INR: INR: 2.5

## 2015-02-07 ENCOUNTER — Other Ambulatory Visit: Payer: Self-pay | Admitting: Cardiology

## 2015-02-13 ENCOUNTER — Ambulatory Visit (INDEPENDENT_AMBULATORY_CARE_PROVIDER_SITE_OTHER): Payer: Medicare Other | Admitting: Physician Assistant

## 2015-02-13 ENCOUNTER — Encounter: Payer: Self-pay | Admitting: Physician Assistant

## 2015-02-13 VITALS — BP 156/86 | HR 63 | Ht 65.0 in | Wt 172.5 lb

## 2015-02-13 DIAGNOSIS — I2510 Atherosclerotic heart disease of native coronary artery without angina pectoris: Secondary | ICD-10-CM

## 2015-02-13 DIAGNOSIS — I10 Essential (primary) hypertension: Secondary | ICD-10-CM

## 2015-02-13 DIAGNOSIS — I2583 Coronary atherosclerosis due to lipid rich plaque: Secondary | ICD-10-CM

## 2015-02-13 DIAGNOSIS — I480 Paroxysmal atrial fibrillation: Secondary | ICD-10-CM

## 2015-02-13 DIAGNOSIS — Z7901 Long term (current) use of anticoagulants: Secondary | ICD-10-CM

## 2015-02-13 DIAGNOSIS — E785 Hyperlipidemia, unspecified: Secondary | ICD-10-CM

## 2015-02-13 MED ORDER — AMLODIPINE BESYLATE 10 MG PO TABS: 10.0000 mg | ORAL_TABLET | Freq: Every day | ORAL | Status: DC

## 2015-02-13 NOTE — Progress Notes (Signed)
Patient ID: Arthur [name], male   DOB: 08/25/1928, 81 y.o.   MRN: [id number]     Date:  02/13/2015   ID:  Arthur [name], DOB 05/02/1929, MRN [id number]  PCP:  Gennette Pac, MD  Primary Cardiologist:  Martinique  Chief Complaint  Patient presents with  . Follow-up    post stemi     History of Present Illness: Arthur [name] is a 81 y.o. male with history of CAD s/p CABG 2005 with MV repair and MAZE procedure at that time, HTN, HLD, PAF, CKD stage II-III who presented to [facility] for planned cath. After his cardiac surgery in 2005 he subsequently developed recurrent CVA and TEE demonstrated persistent left atrial appendage thrombus despite a history of previous ligation of the appendage. He has been on chronic anticoagulation. In April he suffered a new syncopal spell and was found to be in new atrial fibrillation with controlled rate. He had noted intermittent episodes of mid-sternal chest tightness and "indigestion" with exertion. He underwent nuclear stress test in 01/03/2015 revealed a normal ejection fraction however there was a moderate sized, severe intensity, reversible inferior defect consistent with moderate inferior ischemia. Coumadin was held with Lovenox bridging and he was brought in for cardiac cath 01/24/15 which showed severe 3v obstructive CAD with patent LIMA-LAD, SVG-diag, SVG-OM3 and occluded SVG-PDA. He had successful stenting of prox RCA with a DES. Dr. Martinique recommended to treat with DAPT with ASA and Plavix, and resume Coumadin.  Patient is here for posthospital follow-up.  He reports doing well and has good energy level. He currently denies nausea, vomiting, fever, chest pain, shortness of breath, orthopnea, dizziness, PND, cough, congestion, abdominal pain, hematochezia, melena, lower extremity edema.  His last INR was 2.5.  He has his calendar marked to stop his aspirin on the 12th of the month.  Wt Readings from Last 3 Encounters:  02/13/15 172 lb 8 oz  (78.245 kg)  01/25/15 175 lb 11.3 oz (79.7 kg)  01/13/15 171 lb 3.2 oz (77.656 kg)     Past Medical History  Diagnosis Date  . Hyperlipidemia   . Hypertension   . Coronary artery disease     a. s/p CABG in 2005 with MV repair and MAZE procedure. b. s/p DES to prox RCA 01/2015.   Marland Kitchen MVP (mitral valve prolapse)   . PAF (paroxysmal atrial fibrillation)   . History of recurrent TIAs   . Hypertensive vascular disease   . Odynophagia   . Esophagitis     Distal esophagitis  . Transudative pleural effusion   . Heart murmur   . GERD (gastroesophageal reflux disease)   . History of hiatal hernia   . CVA (cerebrovascular accident) ~ 2014    RIGHT BRAIN; denies residual on 01/24/2015  . Arthritis     "some in my joints" (01/24/2015)  . CKD (chronic kidney disease), stage II   . Nephrolithiasis     Current Outpatient Prescriptions  Medication Sig Dispense Refill  . amLODipine (NORVASC) 5 MG tablet TAKE 1 TABLET (5 MG TOTAL) BY MOUTH DAILY. 90 tablet 2  . aspirin EC 81 MG EC tablet Take 1 tablet (81 mg total) by mouth daily. FOR ONE MONTH ONLY then STOP. 30 tablet 0  . benazepril (LOTENSIN) 40 MG tablet TAKE 1 TABLET (40 MG TOTAL) BY MOUTH DAILY. 90 tablet 0  . clopidogrel (PLAVIX) 75 MG tablet Take 1 tablet (75 mg total) by mouth daily. 30 tablet 11  . CRESTOR 20 MG  tablet TAKE 1 TABLET (20 MG TOTAL) BY MOUTH DAILY. 30 tablet 6  . metoprolol tartrate (LOPRESSOR) 25 MG tablet TAKE 1 TABLET (25 MG TOTAL) BY MOUTH 2 (TWO) TIMES DAILY WITH FOOD. 60 tablet 9  . nitroGLYCERIN (NITROSTAT) 0.4 MG SL tablet Place 1 tablet (0.4 mg total) under the tongue every 5 (five) minutes as needed for chest pain (up to 3 doses). 25 tablet 3  . ranitidine (ZANTAC) 150 MG tablet TAKE 1 TABLET (150 MG TOTAL) BY MOUTH 2 (TWO) TIMES DAILY. 60 tablet 9  . warfarin (COUMADIN) 2.5 MG tablet [name] as directed by anticoagulation clinic (Patient taking differently: Take 1.25-2.5 mg by mouth daily at 6 PM. Take 1.25 mg by  mouth on Sun, Mon, Wed, Friday, and Saturday. Take 2.5 mg by mouth on Tuesday and Thurs.) 30 tablet 11   No current facility-administered medications for this visit.    Allergies:    Allergies  Allergen Reactions  . Lasix [Furosemide] Rash  . Penicillins Rash  . Sulfa Drugs Cross Reactors Rash    Social History:  The patient  reports that he quit smoking about 59 years ago. His smoking use included Cigarettes. He quit after 3 years of use. He has never used smokeless tobacco. He reports that he does not drink alcohol or use illicit drugs.   Family history:   Family History  Problem Relation Age of Onset  . Heart attack Mother   . Stroke Father   . Hypertension Father   . Kidney failure Father     ROS:  Please see the history of present illness.  All other systems reviewed and negative.   PHYSICAL EXAM: VS:  BP 156/86 mmHg  Pulse 63  Ht 5\' 5"  (1.651 m)  Wt 172 lb 8 oz (78.245 kg)  BMI 28.71 kg/m2 Well nourished, well developed, in no acute distress HEENT: Pupils are equal round react to light accommodation extraocular movements are intact.  Neck: no JVDNo cervical lymphadenopathy. Cardiac: Regular rate and rhythm without murmurs rubs or gallops. Lungs:  clear to auscultation bilaterally, no wheezing, rhonchi or rales Abd: soft, nontender, positive bowel sounds all quadrants, no hepatosplenomegaly Ext: no lower extremity edema.  2+ radial and dorsalis pedis pulses. Skin: warm and dry Neuro:  Grossly normal  EKG:  Normal sinus rhythm septal Q waves rate 63 bpm   ASSESSMENT AND PLAN:  Problem List Items Addressed This Visit    Paroxysmal atrial fibrillation   Long term current use of anticoagulant   Hypertension   Relevant Orders   EKG 12-Lead   Hyperlipidemia - Primary   Coronary artery disease      Coronary artery disease  Cardiac cath 01/24/15 which showed severe 3v obstructive CAD with patent LIMA-LAD, SVG-diag, SVG-OM3 and occluded SVG-PDA. He had successful  stenting of prox RCA with a DES.  He will discontinue aspirin on September 12 and continue with Coumadin and Plavix.  He is also on 25 mg twice daily metoprolol  Essential hypertension His blood pressures been consistently between 130 to 150s.  Good increase his amlodipine to 10 mg daily.  Paroxysmal atrial fibrillation Maintaining normal sinus rhythm at 63 bpm  Long-term Coumadin use  Last INR 2.5 he is due to have it checked again in our office next week  Hyperlipidemia  continue Crestor   Follow-up in 3 months.

## 2015-02-13 NOTE — Patient Instructions (Addendum)
Increase Amlodipine to 10 mg daily   Stop Aspirin 02/24/15   Appointment with Dr.Jordan Wednesday  05/28/15 at 2:30 pm

## 2015-02-24 ENCOUNTER — Ambulatory Visit (INDEPENDENT_AMBULATORY_CARE_PROVIDER_SITE_OTHER): Payer: Medicare Other | Admitting: Pharmacist Clinician (PhC)/ Clinical Pharmacy Specialist

## 2015-02-24 DIAGNOSIS — Z7901 Long term (current) use of anticoagulants: Secondary | ICD-10-CM | POA: Diagnosis not present

## 2015-02-24 DIAGNOSIS — Z5181 Encounter for therapeutic drug level monitoring: Secondary | ICD-10-CM | POA: Diagnosis not present

## 2015-02-24 DIAGNOSIS — I341 Nonrheumatic mitral (valve) prolapse: Secondary | ICD-10-CM

## 2015-02-24 DIAGNOSIS — Z954 Presence of other heart-valve replacement: Secondary | ICD-10-CM

## 2015-02-24 DIAGNOSIS — Z952 Presence of prosthetic heart valve: Secondary | ICD-10-CM

## 2015-02-24 LAB — POCT INR: INR: 2.8

## 2015-03-12 ENCOUNTER — Other Ambulatory Visit: Payer: Self-pay | Admitting: Cardiology

## 2015-03-18 ENCOUNTER — Telehealth: Payer: Self-pay | Admitting: Cardiology

## 2015-03-18 NOTE — Telephone Encounter (Signed)
Spoke with Malachy Mood Order sign faxed 03/13/15  she is re faxing order now will informed wife.

## 2015-03-18 NOTE — Telephone Encounter (Signed)
Arthur [name] is calling because cardiac rehab is telling them that an order is needed for him to start .Marland Kitchen Please call   Thanks

## 2015-03-24 ENCOUNTER — Ambulatory Visit (INDEPENDENT_AMBULATORY_CARE_PROVIDER_SITE_OTHER): Payer: Medicare Other | Admitting: Pharmacist Clinician (PhC)/ Clinical Pharmacy Specialist

## 2015-03-24 DIAGNOSIS — Z954 Presence of other heart-valve replacement: Secondary | ICD-10-CM | POA: Diagnosis not present

## 2015-03-24 DIAGNOSIS — Z5181 Encounter for therapeutic drug level monitoring: Secondary | ICD-10-CM | POA: Diagnosis not present

## 2015-03-24 DIAGNOSIS — I341 Nonrheumatic mitral (valve) prolapse: Secondary | ICD-10-CM

## 2015-03-24 DIAGNOSIS — Z7901 Long term (current) use of anticoagulants: Secondary | ICD-10-CM

## 2015-03-24 DIAGNOSIS — Z952 Presence of prosthetic heart valve: Secondary | ICD-10-CM

## 2015-03-24 LAB — POCT INR: INR: 2.3

## 2015-03-27 ENCOUNTER — Encounter (HOSPITAL_COMMUNITY)
Admission: RE | Admit: 2015-03-27 | Discharge: 2015-03-27 | Disposition: A | Discharge status: Home / Self Care | Payer: Medicare Other | Source: Ambulatory Visit | Attending: Cardiology | Admitting: Cardiology

## 2015-03-27 DIAGNOSIS — Z955 Presence of coronary angioplasty implant and graft: Secondary | ICD-10-CM | POA: Insufficient documentation

## 2015-03-27 NOTE — Progress Notes (Signed)
Cardiac Rehab Medication Review by a Pharmacist  Does the patient  feel that his/her medications are working for him/her?  yes  Has the patient been experiencing any side effects to the medications prescribed?  no  Does the patient measure his/her own blood pressure or blood glucose at home?  yes   Does the patient have any problems obtaining medications due to transportation or finances?   no  Understanding of regimen: good Understanding of indications: good Potential of compliance: excellent  Pharmacist comments: Patient has good understanding of what medication are for and how to take them. Patient had no questions for me. Did instruct the patient to keep their nitrostat in the amber vial, he was carrying in clear container in pocket. Told him to trash the tablets in the clear container and start carrying amber vial.  Darl Pikes, PharmD Clinical Pharmacist- Resident Pager: [phone]  Darl Pikes 03/27/2015 8:44 AM

## 2015-03-31 ENCOUNTER — Encounter (HOSPITAL_COMMUNITY)
Admission: RE | Admit: 2015-03-31 | Discharge: 2015-03-31 | Disposition: A | Discharge status: Home / Self Care | Payer: Medicare Other | Source: Ambulatory Visit | Attending: Cardiology | Admitting: Cardiology

## 2015-03-31 ENCOUNTER — Encounter (HOSPITAL_COMMUNITY): Payer: Medicare Other

## 2015-03-31 DIAGNOSIS — Z955 Presence of coronary angioplasty implant and graft: Secondary | ICD-10-CM | POA: Diagnosis present

## 2015-03-31 NOTE — Progress Notes (Signed)
Pt started cardiac rehab today.  Pt tolerated light exercise without difficulty. Arthur [name] did complain of having right knee pain during exercise the patient says this has been an ongoing issue for him. VSS, telemetry-Sinus rhythm, asymptomatic.  Medication list reconciled.  Pt verbalized compliance with medications and denies barriers to compliance. PSYCHOSOCIAL ASSESSMENT:  PHQ-0. Pt exhibits positive coping skills, hopeful outlook with supportive family. No psychosocial needs identified at this time, no psychosocial interventions necessary.    Pt enjoys painting.   Pt cardiac rehab  goal is  to increase strength and endurance.  Pt encouraged to participate in functional fitness to increase ability to achieve these goals.   Pt long term cardiac rehab goal is to be able to walk without knee pain.  Pt oriented to exercise equipment and routine.  Understanding verbalized.

## 2015-04-02 ENCOUNTER — Encounter (HOSPITAL_COMMUNITY): Payer: Medicare Other

## 2015-04-02 ENCOUNTER — Encounter (HOSPITAL_COMMUNITY)
Admission: RE | Admit: 2015-04-02 | Discharge: 2015-04-02 | Disposition: A | Discharge status: Home / Self Care | Payer: Medicare Other | Source: Ambulatory Visit | Attending: Cardiology | Admitting: Cardiology

## 2015-04-02 DIAGNOSIS — Z955 Presence of coronary angioplasty implant and graft: Secondary | ICD-10-CM | POA: Diagnosis not present

## 2015-04-04 ENCOUNTER — Encounter (HOSPITAL_COMMUNITY): Payer: Medicare Other

## 2015-04-04 ENCOUNTER — Encounter (HOSPITAL_COMMUNITY)
Admission: RE | Admit: 2015-04-04 | Discharge: 2015-04-04 | Disposition: A | Discharge status: Home / Self Care | Payer: Medicare Other | Source: Ambulatory Visit | Attending: Cardiology | Admitting: Cardiology

## 2015-04-04 DIAGNOSIS — Z955 Presence of coronary angioplasty implant and graft: Secondary | ICD-10-CM | POA: Diagnosis not present

## 2015-04-07 ENCOUNTER — Encounter (HOSPITAL_COMMUNITY): Payer: Medicare Other

## 2015-04-07 ENCOUNTER — Encounter (HOSPITAL_COMMUNITY)
Admission: RE | Admit: 2015-04-07 | Discharge: 2015-04-07 | Disposition: A | Discharge status: Home / Self Care | Payer: Medicare Other | Source: Ambulatory Visit | Attending: Cardiology | Admitting: Cardiology

## 2015-04-07 DIAGNOSIS — Z955 Presence of coronary angioplasty implant and graft: Secondary | ICD-10-CM | POA: Diagnosis not present

## 2015-04-09 ENCOUNTER — Encounter (HOSPITAL_COMMUNITY): Payer: Medicare Other

## 2015-04-09 ENCOUNTER — Encounter (HOSPITAL_COMMUNITY)
Admission: RE | Admit: 2015-04-09 | Discharge: 2015-04-09 | Disposition: A | Discharge status: Home / Self Care | Payer: Medicare Other | Source: Ambulatory Visit | Attending: Cardiology | Admitting: Cardiology

## 2015-04-09 DIAGNOSIS — Z955 Presence of coronary angioplasty implant and graft: Secondary | ICD-10-CM | POA: Diagnosis not present

## 2015-04-11 ENCOUNTER — Encounter (HOSPITAL_COMMUNITY): Payer: Medicare Other

## 2015-04-11 ENCOUNTER — Encounter (HOSPITAL_COMMUNITY)
Admission: RE | Admit: 2015-04-11 | Discharge: 2015-04-11 | Disposition: A | Discharge status: Home / Self Care | Payer: Medicare Other | Source: Ambulatory Visit | Attending: Cardiology | Admitting: Cardiology

## 2015-04-11 DIAGNOSIS — Z955 Presence of coronary angioplasty implant and graft: Secondary | ICD-10-CM | POA: Diagnosis not present

## 2015-04-11 NOTE — Progress Notes (Signed)
Reviewed home exercise with pt today.  Pt plans to walk at home for exercise.  Reviewed THR, pulse, RPE, sign and symptoms, and when to call 911 or MD.  Pt voiced understanding. Romell Wolden, MA, ACSM RCEP   

## 2015-04-14 ENCOUNTER — Encounter (HOSPITAL_COMMUNITY)
Admission: RE | Admit: 2015-04-14 | Discharge: 2015-04-14 | Disposition: A | Discharge status: Home / Self Care | Payer: Medicare Other | Source: Ambulatory Visit | Attending: Cardiology | Admitting: Cardiology

## 2015-04-14 ENCOUNTER — Encounter (HOSPITAL_COMMUNITY): Payer: Medicare Other

## 2015-04-14 DIAGNOSIS — Z955 Presence of coronary angioplasty implant and graft: Secondary | ICD-10-CM | POA: Diagnosis not present

## 2015-04-14 NOTE — Progress Notes (Signed)
Arthur [name] 81 y.o. male Nutrition Note Spoke with pt.  Nutrition Survey reviewed with pt. Pt is following Step 2 of the Therapeutic Lifestyle Changes diet. Age-appropriate nutrition recommendations discussed. Pt wants to lose wt " a couple of pounds." Pt has not been actively trying to lose wt. Wt loss tips reviewed. Pt is on Coumadin and is aware of the need to follow a diet with consistent vitamin K intake (e.g. "I only eat 2 salads a week."). Pt expressed understanding of the information reviewed. Pt aware of nutrition education classes offered and is unable to attend nutrition classes due to "sharing a car with my wife." Lab Results  Component Value Date   HGBA1C 5.6 05/14/2011   Wt Readings from Last 3 Encounters:  03/27/15 175 lb 4.3 oz (79.5 kg)  02/13/15 172 lb 8 oz (78.245 kg)  01/25/15 175 lb 11.3 oz (79.7 kg)   Nutrition Diagnosis ? Food-and nutrition-related knowledge deficit related to lack of exposure to information as related to diagnosis of: ? CVD ? Overweight related to excessive energy intake as evidenced by a BMI of 28.7  Nutrition Intervention ? Benefits of adopting Therapeutic Lifestyle Changes discussed when Medficts reviewed. ? Pt to attend the Portion Distortion class ? Pt given handouts for: ? Nutrition I class ? Nutrition II class ? Continue client-centered nutrition education by RD, as part of interdisciplinary care.  Goal(s) ? Pt to identify food quantities necessary to achieve: ? wt loss to a goal wt of 170 lb (77.3 kg) at graduation from cardiac rehab.  ? Pt to describe the benefit of including fruits, vegetables, whole grains, and low-fat dairy products in a heart healthy meal plan.  Monitor and Evaluate progress toward nutrition goal with team.  Derek Mound, M.Ed, RD, LDN, CDE 04/14/2015 10:38 AM

## 2015-04-16 ENCOUNTER — Encounter (HOSPITAL_COMMUNITY)
Admission: RE | Admit: 2015-04-16 | Discharge: 2015-04-16 | Disposition: A | Discharge status: Home / Self Care | Payer: Medicare Other | Source: Ambulatory Visit | Attending: Cardiology | Admitting: Cardiology

## 2015-04-16 ENCOUNTER — Encounter (HOSPITAL_COMMUNITY): Payer: Medicare Other

## 2015-04-16 DIAGNOSIS — Z955 Presence of coronary angioplasty implant and graft: Secondary | ICD-10-CM | POA: Diagnosis present

## 2015-04-18 ENCOUNTER — Encounter (HOSPITAL_COMMUNITY): Payer: Medicare Other

## 2015-04-18 ENCOUNTER — Encounter (HOSPITAL_COMMUNITY)
Admission: RE | Admit: 2015-04-18 | Discharge: 2015-04-18 | Disposition: A | Discharge status: Home / Self Care | Payer: Medicare Other | Source: Ambulatory Visit | Attending: Cardiology | Admitting: Cardiology

## 2015-04-18 DIAGNOSIS — Z955 Presence of coronary angioplasty implant and graft: Secondary | ICD-10-CM | POA: Diagnosis not present

## 2015-04-21 ENCOUNTER — Encounter (HOSPITAL_COMMUNITY): Payer: Medicare Other

## 2015-04-21 ENCOUNTER — Encounter (HOSPITAL_COMMUNITY)
Admission: RE | Admit: 2015-04-21 | Discharge: 2015-04-21 | Disposition: A | Discharge status: Home / Self Care | Payer: Medicare Other | Source: Ambulatory Visit | Attending: Cardiology | Admitting: Cardiology

## 2015-04-21 DIAGNOSIS — Z955 Presence of coronary angioplasty implant and graft: Secondary | ICD-10-CM | POA: Diagnosis not present

## 2015-04-23 ENCOUNTER — Encounter (HOSPITAL_COMMUNITY): Payer: Medicare Other

## 2015-04-23 ENCOUNTER — Encounter (HOSPITAL_COMMUNITY)
Admission: RE | Admit: 2015-04-23 | Discharge: 2015-04-23 | Disposition: A | Discharge status: Home / Self Care | Payer: Medicare Other | Source: Ambulatory Visit | Attending: Cardiology | Admitting: Cardiology

## 2015-04-23 DIAGNOSIS — Z955 Presence of coronary angioplasty implant and graft: Secondary | ICD-10-CM | POA: Diagnosis not present

## 2015-04-25 ENCOUNTER — Encounter (HOSPITAL_COMMUNITY): Payer: Medicare Other

## 2015-04-25 ENCOUNTER — Encounter (HOSPITAL_COMMUNITY)
Admission: RE | Admit: 2015-04-25 | Discharge: 2015-04-25 | Disposition: A | Discharge status: Home / Self Care | Payer: Medicare Other | Source: Ambulatory Visit | Attending: Cardiology | Admitting: Cardiology

## 2015-04-25 DIAGNOSIS — Z955 Presence of coronary angioplasty implant and graft: Secondary | ICD-10-CM | POA: Diagnosis not present

## 2015-04-28 ENCOUNTER — Encounter (HOSPITAL_COMMUNITY): Payer: Medicare Other

## 2015-04-28 ENCOUNTER — Encounter (HOSPITAL_COMMUNITY)
Admission: RE | Admit: 2015-04-28 | Discharge: 2015-04-28 | Disposition: A | Discharge status: Home / Self Care | Payer: Medicare Other | Source: Ambulatory Visit | Attending: Cardiology | Admitting: Cardiology

## 2015-04-28 DIAGNOSIS — Z955 Presence of coronary angioplasty implant and graft: Secondary | ICD-10-CM | POA: Diagnosis not present

## 2015-04-30 ENCOUNTER — Encounter (HOSPITAL_COMMUNITY)
Admission: RE | Admit: 2015-04-30 | Discharge: 2015-04-30 | Disposition: A | Discharge status: Home / Self Care | Payer: Medicare Other | Source: Ambulatory Visit | Attending: Cardiology | Admitting: Cardiology

## 2015-04-30 ENCOUNTER — Encounter (HOSPITAL_COMMUNITY): Payer: Medicare Other

## 2015-04-30 DIAGNOSIS — Z955 Presence of coronary angioplasty implant and graft: Secondary | ICD-10-CM | POA: Diagnosis not present

## 2015-05-02 ENCOUNTER — Encounter (HOSPITAL_COMMUNITY): Payer: Medicare Other

## 2015-05-02 ENCOUNTER — Encounter (HOSPITAL_COMMUNITY)
Admission: RE | Admit: 2015-05-02 | Discharge: 2015-05-02 | Disposition: A | Discharge status: Home / Self Care | Payer: Medicare Other | Source: Ambulatory Visit | Attending: Cardiology | Admitting: Cardiology

## 2015-05-02 DIAGNOSIS — Z955 Presence of coronary angioplasty implant and graft: Secondary | ICD-10-CM | POA: Diagnosis not present

## 2015-05-05 ENCOUNTER — Ambulatory Visit (INDEPENDENT_AMBULATORY_CARE_PROVIDER_SITE_OTHER): Payer: Medicare Other | Admitting: Pharmacist Clinician (PhC)/ Clinical Pharmacy Specialist

## 2015-05-05 ENCOUNTER — Encounter (HOSPITAL_COMMUNITY)
Admission: RE | Admit: 2015-05-05 | Discharge: 2015-05-05 | Disposition: A | Discharge status: Home / Self Care | Payer: Medicare Other | Source: Ambulatory Visit | Attending: Cardiology | Admitting: Cardiology

## 2015-05-05 ENCOUNTER — Encounter (HOSPITAL_COMMUNITY): Payer: Medicare Other

## 2015-05-05 DIAGNOSIS — Z7901 Long term (current) use of anticoagulants: Secondary | ICD-10-CM | POA: Diagnosis not present

## 2015-05-05 DIAGNOSIS — I341 Nonrheumatic mitral (valve) prolapse: Secondary | ICD-10-CM

## 2015-05-05 DIAGNOSIS — Z954 Presence of other heart-valve replacement: Secondary | ICD-10-CM

## 2015-05-05 DIAGNOSIS — Z5181 Encounter for therapeutic drug level monitoring: Secondary | ICD-10-CM

## 2015-05-05 DIAGNOSIS — Z955 Presence of coronary angioplasty implant and graft: Secondary | ICD-10-CM | POA: Diagnosis not present

## 2015-05-05 DIAGNOSIS — Z952 Presence of prosthetic heart valve: Secondary | ICD-10-CM

## 2015-05-05 LAB — POCT INR: INR: 2.5

## 2015-05-07 ENCOUNTER — Encounter (HOSPITAL_COMMUNITY)
Admission: RE | Admit: 2015-05-07 | Discharge: 2015-05-07 | Disposition: A | Discharge status: Home / Self Care | Payer: Medicare Other | Source: Ambulatory Visit | Attending: Cardiology | Admitting: Cardiology

## 2015-05-07 ENCOUNTER — Encounter (HOSPITAL_COMMUNITY): Payer: Medicare Other

## 2015-05-07 DIAGNOSIS — Z955 Presence of coronary angioplasty implant and graft: Secondary | ICD-10-CM | POA: Diagnosis not present

## 2015-05-12 ENCOUNTER — Encounter (HOSPITAL_COMMUNITY): Payer: Medicare Other

## 2015-05-12 ENCOUNTER — Encounter (HOSPITAL_COMMUNITY)
Admission: RE | Admit: 2015-05-12 | Discharge: 2015-05-12 | Disposition: A | Discharge status: Home / Self Care | Payer: Medicare Other | Source: Ambulatory Visit | Attending: Cardiology | Admitting: Cardiology

## 2015-05-12 DIAGNOSIS — Z955 Presence of coronary angioplasty implant and graft: Secondary | ICD-10-CM | POA: Diagnosis not present

## 2015-05-14 ENCOUNTER — Encounter (HOSPITAL_COMMUNITY)
Admission: RE | Admit: 2015-05-14 | Discharge: 2015-05-14 | Disposition: A | Discharge status: Home / Self Care | Payer: Medicare Other | Source: Ambulatory Visit | Attending: Cardiology | Admitting: Cardiology

## 2015-05-14 ENCOUNTER — Encounter (HOSPITAL_COMMUNITY): Payer: Medicare Other

## 2015-05-14 DIAGNOSIS — Z955 Presence of coronary angioplasty implant and graft: Secondary | ICD-10-CM | POA: Diagnosis not present

## 2015-05-16 ENCOUNTER — Encounter (HOSPITAL_COMMUNITY)
Admission: RE | Admit: 2015-05-16 | Discharge: 2015-05-16 | Disposition: A | Discharge status: Home / Self Care | Payer: Medicare Other | Source: Ambulatory Visit | Attending: Cardiology | Admitting: Cardiology

## 2015-05-16 ENCOUNTER — Encounter (HOSPITAL_COMMUNITY): Payer: Medicare Other

## 2015-05-16 DIAGNOSIS — Z955 Presence of coronary angioplasty implant and graft: Secondary | ICD-10-CM | POA: Diagnosis present

## 2015-05-19 ENCOUNTER — Encounter (HOSPITAL_COMMUNITY): Payer: Medicare Other

## 2015-05-19 ENCOUNTER — Encounter (HOSPITAL_COMMUNITY)
Admission: RE | Admit: 2015-05-19 | Discharge: 2015-05-19 | Disposition: A | Discharge status: Home / Self Care | Payer: Medicare Other | Source: Ambulatory Visit | Attending: Cardiology | Admitting: Cardiology

## 2015-05-19 DIAGNOSIS — Z955 Presence of coronary angioplasty implant and graft: Secondary | ICD-10-CM | POA: Diagnosis not present

## 2015-05-21 ENCOUNTER — Encounter (HOSPITAL_COMMUNITY): Payer: Medicare Other

## 2015-05-21 ENCOUNTER — Encounter (HOSPITAL_COMMUNITY)
Admission: RE | Admit: 2015-05-21 | Discharge: 2015-05-21 | Disposition: A | Discharge status: Home / Self Care | Payer: Medicare Other | Source: Ambulatory Visit | Attending: Cardiology | Admitting: Cardiology

## 2015-05-21 DIAGNOSIS — Z955 Presence of coronary angioplasty implant and graft: Secondary | ICD-10-CM | POA: Diagnosis not present

## 2015-05-23 ENCOUNTER — Encounter (HOSPITAL_COMMUNITY): Payer: Medicare Other

## 2015-05-23 ENCOUNTER — Encounter (HOSPITAL_COMMUNITY)
Admission: RE | Admit: 2015-05-23 | Discharge: 2015-05-23 | Disposition: A | Discharge status: Home / Self Care | Payer: Medicare Other | Source: Ambulatory Visit | Attending: Cardiology | Admitting: Cardiology

## 2015-05-23 DIAGNOSIS — Z955 Presence of coronary angioplasty implant and graft: Secondary | ICD-10-CM | POA: Diagnosis not present

## 2015-05-26 ENCOUNTER — Encounter (HOSPITAL_COMMUNITY): Payer: Medicare Other

## 2015-05-26 ENCOUNTER — Encounter (HOSPITAL_COMMUNITY)
Admission: RE | Admit: 2015-05-26 | Discharge: 2015-05-26 | Disposition: A | Discharge status: Home / Self Care | Payer: Medicare Other | Source: Ambulatory Visit | Attending: Cardiology | Admitting: Cardiology

## 2015-05-26 DIAGNOSIS — Z955 Presence of coronary angioplasty implant and graft: Secondary | ICD-10-CM | POA: Diagnosis not present

## 2015-05-28 ENCOUNTER — Encounter (HOSPITAL_COMMUNITY)
Admission: RE | Admit: 2015-05-28 | Discharge: 2015-05-28 | Disposition: A | Discharge status: Home / Self Care | Payer: Medicare Other | Source: Ambulatory Visit | Attending: Cardiology | Admitting: Cardiology

## 2015-05-28 ENCOUNTER — Encounter (HOSPITAL_COMMUNITY): Payer: Medicare Other

## 2015-05-28 ENCOUNTER — Encounter: Payer: Self-pay | Admitting: Cardiology

## 2015-05-28 ENCOUNTER — Ambulatory Visit (INDEPENDENT_AMBULATORY_CARE_PROVIDER_SITE_OTHER): Payer: Medicare Other | Admitting: Cardiology

## 2015-05-28 VITALS — BP 118/54 | HR 74 | Ht 66.0 in | Wt 173.6 lb

## 2015-05-28 DIAGNOSIS — E785 Hyperlipidemia, unspecified: Secondary | ICD-10-CM | POA: Diagnosis not present

## 2015-05-28 DIAGNOSIS — I10 Essential (primary) hypertension: Secondary | ICD-10-CM | POA: Diagnosis not present

## 2015-05-28 DIAGNOSIS — I480 Paroxysmal atrial fibrillation: Secondary | ICD-10-CM

## 2015-05-28 DIAGNOSIS — I25810 Atherosclerosis of coronary artery bypass graft(s) without angina pectoris: Secondary | ICD-10-CM

## 2015-05-28 DIAGNOSIS — Z955 Presence of coronary angioplasty implant and graft: Secondary | ICD-10-CM | POA: Diagnosis not present

## 2015-05-28 DIAGNOSIS — Z5181 Encounter for therapeutic drug level monitoring: Secondary | ICD-10-CM | POA: Diagnosis not present

## 2015-05-28 NOTE — Patient Instructions (Signed)
Continue your current therapy  I will see you in 6 months.   

## 2015-05-28 NOTE — Progress Notes (Signed)
Arthur [name] Date of Birth: 11-16-1928   History of Present Illness: Arthur [name] is seen for followup of CAD. He has a history of coronary disease and is status post CABG in 2005. He had mitral valve repair and a Maze procedure at that time. He subsequently developed recurrent CVA and TEE demonstrated persistent left atrial appendage thrombus despite a history of previous ligation of the appendage. He has been on chronic anticoagulation.  In April 2016 he presented with a near syncopal episode. He was found to be in atrial fibrillation. Managed with rate control and anticoagulation.   He underwent nuclear stress test in 01/03/2015 revealed a normal ejection fraction however there was a moderate sized, severe intensity, reversible inferior defect consistent with moderate inferior ischemia. Coumadin was held with Lovenox bridging and he was brought in for cardiac cath 01/24/15 which showed severe 3v obstructive CAD with patent LIMA-LAD, SVG-diag, SVG-OM3 and occluded SVG-PDA. He had successful stenting of prox RCA with a DES. He remains on Plavix and Coumadin.  On follow up today he is feeling well. No chest pain or SOB. He is an active participant at [facility]. No palpitations. No TIA or CVA symptoms.    Current Outpatient Prescriptions on File Prior to Visit  Medication Sig Dispense Refill  . amLODipine (NORVASC) 10 MG tablet Take 1 tablet (10 mg total) by mouth daily. 90 tablet 3  . benazepril (LOTENSIN) 40 MG tablet TAKE 1 TABLET (40 MG TOTAL) BY MOUTH DAILY. 90 tablet 0  . clopidogrel (PLAVIX) 75 MG tablet Take 1 tablet (75 mg total) by mouth daily. 30 tablet 11  . CRESTOR 20 MG tablet TAKE 1 TABLET (20 MG TOTAL) BY MOUTH DAILY. 30 tablet 2  . metoprolol tartrate (LOPRESSOR) 25 MG tablet TAKE 1 TABLET (25 MG TOTAL) BY MOUTH 2 (TWO) TIMES DAILY WITH FOOD. 60 tablet 9  . nitroGLYCERIN (NITROSTAT) 0.4 MG SL tablet Place 1 tablet (0.4 mg total) under the tongue every 5 (five) minutes as needed  for chest pain (up to 3 doses). 25 tablet 3  . ranitidine (ZANTAC) 150 MG tablet TAKE 1 TABLET (150 MG TOTAL) BY MOUTH 2 (TWO) TIMES DAILY. 60 tablet 9  . warfarin (COUMADIN) 2.5 MG tablet Arthur [name] as directed by anticoagulation clinic (Patient taking differently: Take 1.25-2.5 mg by mouth daily at 6 PM. Take 2.5 mg by mouth on Sun, Mon, Wed, and Friday. Take 1.25 mg by mouth on Tuesday, Thurs, and Saturday.) 30 tablet 11   No current facility-administered medications on file prior to visit.    Allergies  Allergen Reactions  . Lasix [Furosemide] Rash  . Penicillins Rash  . Sulfa Drugs Cross Reactors Rash    Past Medical History  Diagnosis Date  . Hyperlipidemia   . Hypertension   . Coronary artery disease     a. s/p CABG in 2005 with MV repair and MAZE procedure. b. s/p DES to prox RCA 01/2015.   Marland Kitchen MVP (mitral valve prolapse)   . PAF (paroxysmal atrial fibrillation) ([place])   . History of recurrent TIAs   . Hypertensive vascular disease   . Odynophagia   . Esophagitis     Distal esophagitis  . Transudative pleural effusion   . Heart murmur   . GERD (gastroesophageal reflux disease)   . History of hiatal hernia   . CVA (cerebrovascular accident) [facility]) ~ 2014    RIGHT BRAIN; denies residual on 01/24/2015  . Arthritis     "some in my joints" (01/24/2015)  .  CKD (chronic kidney disease), stage II   . Nephrolithiasis     Past Surgical History  Procedure Laterality Date  . Maze  04/2004  . Mitral valve repair  04/2004  . Tee without cardioversion  05/19/2011    Procedure: TRANSESOPHAGEAL ECHOCARDIOGRAM (TEE);  Surgeon: Arthur Martinique, MD;  Location: [facility] ENDOSCOPY;  Service: Cardiovascular;  Laterality: N/A;  . Tonsillectomy and adenoidectomy  1944  . Appendectomy  1960's  . Cardiac catheterization  03/19/2004  . Coronary angioplasty with stent placement  01/24/2015    "1 stent"  . Coronary artery bypass graft  04/2004    LIMA GRAFT TO THE DISTAL LAD, SAPHENOUS VEIN GRAFT TO THE FIRST  DIADGONAL BRANCH, SAPHENOUS VEIN GRAFT TO THE THIRD MARIGINAL BRANCH, AND SAPHENOUS VEIN GRAFT TO THE PDA  . Cardiac catheterization N/A 01/24/2015    Procedure: Left Heart Cath and Coronary Angiography;  Surgeon: Arthur M Martinique, MD;  Location: [facility] CV LAB;  Service: Cardiovascular;  Laterality: N/A;  . Cardiac catheterization N/A 01/24/2015    Procedure: Coronary Stent Intervention;  Surgeon: Arthur M Martinique, MD;  Location: [facility] LAB;  Service: Cardiovascular;  Laterality: N/A;    History  Smoking status  . Former Smoker -- 3 years  . Types: Cigarettes  . Quit date: 06/15/1955  Smokeless tobacco  . Never Used    History  Alcohol Use No    Family History  Problem Relation Age of Onset  . Heart attack Mother   . Stroke Father   . Hypertension Father   . Kidney failure Father     Review of Systems: As noted in history of present illness.  All other systems were reviewed and are negative.  Physical Exam: BP 118/54 mmHg  Pulse 74  Ht 5\' 6"  (1.676 m)  Wt 78.744 kg (173 lb 9.6 oz)  BMI 28.03 kg/m2 He is a pleasant elderly male in no apparent distress. HEENT is normal.  Neck is without JVD, adenopathy, thyromegaly, or bruits. Lungs are clear. Cardiac exam reveals an irregular rate and rhythm without gallop, murmur, or click. Normal S1-2. No gallop or murmur. Abdomen is soft and nontender without masses or bruits. His femoral and pedal pulses are 2+. He has no edema. He is alert and oriented x3 and cranial nerves II through XII are intact.  LABORATORY DATA:   Lab Results  Component Value Date   WBC 7.0 01/25/2015   HGB 13.5 01/25/2015   HCT 40.6 01/25/2015   PLT 182 01/25/2015   GLUCOSE 93 01/25/2015   CHOL 158 06/17/2014   TRIG 103.0 06/17/2014   HDL 43.20 06/17/2014   LDLCALC 94 06/17/2014   ALT 21 06/17/2014   AST 28 06/17/2014   NA 137 01/25/2015   K 3.7 01/25/2015   CL 108 01/25/2015   CREATININE 1.22 01/25/2015   BUN 17 01/25/2015   CO2 23  01/25/2015   TSH 0.293* 05/14/2011   INR 2.5 05/05/2015   HGBA1C 5.6 05/14/2011    Assessment / Plan: 1. History of CVA. TEE documented a left atrial appendage thrombus despite prior ligation of the atrial appendage. He is on chronic anticoagulation and this is adjusted  by pharmacy.  2. Coronary disease status post CABG in 2005. He also had a Maze procedure at that time.  Cardiac cath in August showed all grafts were patent except SVG to the RCA that was occluded. He underwent successful stenting of the proximal RCA with DES. Plan to continue Plavix for one year then  will continue with Coumadin only.   3. Atrial fibrillation-status post Maze procedure. He was in Afib in April which may have contributed to his near syncopal episode. Will continue metoprolol and coumadin.   4. Hypercholesterolemia. On chronic Crestor.   Follow up in 6 months with fasting lab work and CBC.

## 2015-05-30 ENCOUNTER — Encounter (HOSPITAL_COMMUNITY): Payer: Medicare Other

## 2015-05-30 ENCOUNTER — Encounter (HOSPITAL_COMMUNITY)
Admission: RE | Admit: 2015-05-30 | Discharge: 2015-05-30 | Disposition: A | Discharge status: Home / Self Care | Payer: Medicare Other | Source: Ambulatory Visit | Attending: Cardiology | Admitting: Cardiology

## 2015-05-30 DIAGNOSIS — Z955 Presence of coronary angioplasty implant and graft: Secondary | ICD-10-CM | POA: Diagnosis not present

## 2015-06-02 ENCOUNTER — Encounter (HOSPITAL_COMMUNITY): Payer: Medicare Other

## 2015-06-02 ENCOUNTER — Encounter (HOSPITAL_COMMUNITY)
Admission: RE | Admit: 2015-06-02 | Discharge: 2015-06-02 | Disposition: A | Discharge status: Home / Self Care | Payer: Medicare Other | Source: Ambulatory Visit | Attending: Cardiology | Admitting: Cardiology

## 2015-06-02 DIAGNOSIS — Z955 Presence of coronary angioplasty implant and graft: Secondary | ICD-10-CM | POA: Diagnosis not present

## 2015-06-04 ENCOUNTER — Encounter (HOSPITAL_COMMUNITY): Payer: Medicare Other

## 2015-06-04 ENCOUNTER — Encounter (HOSPITAL_COMMUNITY)
Admission: RE | Admit: 2015-06-04 | Discharge: 2015-06-04 | Disposition: A | Discharge status: Home / Self Care | Payer: Medicare Other | Source: Ambulatory Visit | Attending: Cardiology | Admitting: Cardiology

## 2015-06-04 DIAGNOSIS — Z955 Presence of coronary angioplasty implant and graft: Secondary | ICD-10-CM | POA: Diagnosis not present

## 2015-06-06 ENCOUNTER — Encounter (HOSPITAL_COMMUNITY): Payer: Medicare Other

## 2015-06-09 ENCOUNTER — Other Ambulatory Visit: Payer: Self-pay | Admitting: Cardiology

## 2015-06-10 NOTE — Telephone Encounter (Signed)
REFILL 

## 2015-06-11 ENCOUNTER — Encounter (HOSPITAL_COMMUNITY): Payer: Medicare Other

## 2015-06-13 ENCOUNTER — Encounter (HOSPITAL_COMMUNITY): Payer: Medicare Other

## 2015-06-16 ENCOUNTER — Encounter (HOSPITAL_COMMUNITY): Payer: Medicare Other

## 2015-06-16 DIAGNOSIS — Z955 Presence of coronary angioplasty implant and graft: Secondary | ICD-10-CM | POA: Insufficient documentation

## 2015-06-18 ENCOUNTER — Encounter (HOSPITAL_COMMUNITY)
Admission: RE | Admit: 2015-06-18 | Discharge: 2015-06-18 | Disposition: A | Discharge status: Home / Self Care | Payer: Medicare Other | Source: Ambulatory Visit | Attending: Cardiology | Admitting: Cardiology

## 2015-06-18 ENCOUNTER — Ambulatory Visit (INDEPENDENT_AMBULATORY_CARE_PROVIDER_SITE_OTHER): Payer: Medicare Other | Admitting: Pharmacist Clinician (PhC)/ Clinical Pharmacy Specialist

## 2015-06-18 ENCOUNTER — Encounter (HOSPITAL_COMMUNITY): Payer: Medicare Other

## 2015-06-18 DIAGNOSIS — I341 Nonrheumatic mitral (valve) prolapse: Secondary | ICD-10-CM | POA: Diagnosis not present

## 2015-06-18 DIAGNOSIS — Z5181 Encounter for therapeutic drug level monitoring: Secondary | ICD-10-CM

## 2015-06-18 DIAGNOSIS — Z954 Presence of other heart-valve replacement: Secondary | ICD-10-CM

## 2015-06-18 DIAGNOSIS — Z7901 Long term (current) use of anticoagulants: Secondary | ICD-10-CM

## 2015-06-18 DIAGNOSIS — Z952 Presence of prosthetic heart valve: Secondary | ICD-10-CM

## 2015-06-18 DIAGNOSIS — Z955 Presence of coronary angioplasty implant and graft: Secondary | ICD-10-CM | POA: Diagnosis not present

## 2015-06-18 LAB — POCT INR: INR: 1.8

## 2015-06-20 ENCOUNTER — Encounter (HOSPITAL_COMMUNITY)
Admission: RE | Admit: 2015-06-20 | Discharge: 2015-06-20 | Disposition: A | Discharge status: Home / Self Care | Payer: Medicare Other | Source: Ambulatory Visit | Attending: Cardiology | Admitting: Cardiology

## 2015-06-20 ENCOUNTER — Encounter (HOSPITAL_COMMUNITY): Payer: Medicare Other

## 2015-06-20 DIAGNOSIS — Z955 Presence of coronary angioplasty implant and graft: Secondary | ICD-10-CM | POA: Diagnosis not present

## 2015-06-23 ENCOUNTER — Encounter (HOSPITAL_COMMUNITY): Payer: Medicare Other

## 2015-06-25 ENCOUNTER — Encounter (HOSPITAL_COMMUNITY)
Admission: RE | Admit: 2015-06-25 | Discharge: 2015-06-25 | Disposition: A | Discharge status: Home / Self Care | Payer: Medicare Other | Source: Ambulatory Visit | Attending: Cardiology | Admitting: Cardiology

## 2015-06-25 ENCOUNTER — Encounter (HOSPITAL_COMMUNITY): Payer: Medicare Other

## 2015-06-25 ENCOUNTER — Other Ambulatory Visit: Payer: Self-pay | Admitting: Cardiology

## 2015-06-25 DIAGNOSIS — Z955 Presence of coronary angioplasty implant and graft: Secondary | ICD-10-CM | POA: Diagnosis not present

## 2015-06-25 NOTE — Telephone Encounter (Signed)
Rx request sent to pharmacy.  

## 2015-06-27 ENCOUNTER — Encounter (HOSPITAL_COMMUNITY)
Admission: RE | Admit: 2015-06-27 | Discharge: 2015-06-27 | Disposition: A | Discharge status: Home / Self Care | Payer: Medicare Other | Source: Ambulatory Visit | Attending: Cardiology | Admitting: Cardiology

## 2015-06-27 ENCOUNTER — Encounter (HOSPITAL_COMMUNITY): Payer: Medicare Other

## 2015-06-27 DIAGNOSIS — Z955 Presence of coronary angioplasty implant and graft: Secondary | ICD-10-CM | POA: Diagnosis not present

## 2015-06-30 ENCOUNTER — Encounter (HOSPITAL_COMMUNITY)
Admission: RE | Admit: 2015-06-30 | Discharge: 2015-06-30 | Disposition: A | Discharge status: Home / Self Care | Payer: Medicare Other | Source: Ambulatory Visit | Attending: Cardiology | Admitting: Cardiology

## 2015-06-30 ENCOUNTER — Encounter (HOSPITAL_COMMUNITY): Payer: Medicare Other

## 2015-06-30 DIAGNOSIS — Z955 Presence of coronary angioplasty implant and graft: Secondary | ICD-10-CM | POA: Diagnosis not present

## 2015-07-02 ENCOUNTER — Encounter (HOSPITAL_COMMUNITY)
Admission: RE | Admit: 2015-07-02 | Discharge: 2015-07-02 | Disposition: A | Discharge status: Home / Self Care | Payer: Medicare Other | Source: Ambulatory Visit | Attending: Cardiology | Admitting: Cardiology

## 2015-07-02 ENCOUNTER — Encounter (HOSPITAL_COMMUNITY): Payer: Medicare Other

## 2015-07-02 DIAGNOSIS — Z955 Presence of coronary angioplasty implant and graft: Secondary | ICD-10-CM | POA: Diagnosis not present

## 2015-07-04 ENCOUNTER — Encounter (HOSPITAL_COMMUNITY): Payer: Medicare Other

## 2015-07-04 ENCOUNTER — Encounter (HOSPITAL_COMMUNITY)
Admission: RE | Admit: 2015-07-04 | Discharge: 2015-07-04 | Disposition: A | Discharge status: Home / Self Care | Payer: Medicare Other | Source: Ambulatory Visit | Attending: Cardiology | Admitting: Cardiology

## 2015-07-04 DIAGNOSIS — Z955 Presence of coronary angioplasty implant and graft: Secondary | ICD-10-CM | POA: Diagnosis not present

## 2015-07-07 ENCOUNTER — Encounter (HOSPITAL_COMMUNITY)
Admission: RE | Admit: 2015-07-07 | Discharge: 2015-07-07 | Disposition: A | Discharge status: Home / Self Care | Payer: Medicare Other | Source: Ambulatory Visit | Attending: Cardiology | Admitting: Cardiology

## 2015-07-07 DIAGNOSIS — Z955 Presence of coronary angioplasty implant and graft: Secondary | ICD-10-CM | POA: Diagnosis not present

## 2015-07-07 NOTE — Progress Notes (Signed)
Pt graduated from cardiac rehab program today with completion of 36 exercise sessions in Phase II. Pt maintained good attendance and progressed nicely during his participation in rehab as evidenced by increased MET level.   Medication list reconciled. Repeat  PHQ score- 0 .  Pt has made significant lifestyle changes and should be commended for his success. Pt feels he has achieved his goals during cardiac rehab.   Pt plans to continue exercise by walking on  His own. Arthur [name] has had some systolic blood pressures in the mid to upper 90's post exercise. Patient asymptomatic. Arthur [name], Dr Doug Sou nurse called and notified. Will fax exercise flow sheets to Dr. Doug Sou office for review.

## 2015-07-09 ENCOUNTER — Encounter (HOSPITAL_COMMUNITY): Payer: Medicare Other

## 2015-07-09 ENCOUNTER — Ambulatory Visit (INDEPENDENT_AMBULATORY_CARE_PROVIDER_SITE_OTHER): Payer: Medicare Other | Admitting: Pharmacist Clinician (PhC)/ Clinical Pharmacy Specialist

## 2015-07-09 DIAGNOSIS — Z954 Presence of other heart-valve replacement: Secondary | ICD-10-CM

## 2015-07-09 DIAGNOSIS — Z5181 Encounter for therapeutic drug level monitoring: Secondary | ICD-10-CM | POA: Diagnosis not present

## 2015-07-09 DIAGNOSIS — Z7901 Long term (current) use of anticoagulants: Secondary | ICD-10-CM | POA: Diagnosis not present

## 2015-07-09 DIAGNOSIS — Z952 Presence of prosthetic heart valve: Secondary | ICD-10-CM

## 2015-07-09 DIAGNOSIS — I341 Nonrheumatic mitral (valve) prolapse: Secondary | ICD-10-CM | POA: Diagnosis not present

## 2015-07-09 LAB — POCT INR: INR: 3.3

## 2015-07-10 ENCOUNTER — Telehealth: Payer: Self-pay | Admitting: Pharmacist Clinician (PhC)/ Clinical Pharmacy Specialist

## 2015-07-10 ENCOUNTER — Telehealth: Payer: Self-pay | Admitting: Cardiology

## 2015-07-10 MED ORDER — AMLODIPINE BESYLATE 5 MG PO TABS: 5.0000 mg | ORAL_TABLET | Freq: Every day | ORAL | Status: DC

## 2015-07-10 NOTE — Telephone Encounter (Signed)
Patient walked in office.Advised I spoke to [facility] in cardiac rehab and received her fax of your blood pressures.Dr.Jordan advised to decrease amlodipine to 5 mg daily.

## 2015-07-10 NOTE — Telephone Encounter (Signed)
New problem    Verdis Frederickson need to speak to your concerning this which is waiting in office.

## 2015-07-11 ENCOUNTER — Encounter (HOSPITAL_COMMUNITY): Payer: Medicare Other

## 2015-07-14 ENCOUNTER — Encounter (HOSPITAL_COMMUNITY): Payer: Medicare Other

## 2015-07-16 ENCOUNTER — Encounter (HOSPITAL_COMMUNITY): Payer: Medicare Other

## 2015-07-16 NOTE — Telephone Encounter (Signed)
Close encounters °

## 2015-07-18 ENCOUNTER — Encounter (HOSPITAL_COMMUNITY): Payer: Medicare Other

## 2015-07-18 ENCOUNTER — Encounter: Payer: Medicare Other | Admitting: Pharmacist Clinician (PhC)/ Clinical Pharmacy Specialist

## 2015-07-21 ENCOUNTER — Encounter (HOSPITAL_COMMUNITY): Payer: Medicare Other

## 2015-07-23 ENCOUNTER — Encounter (HOSPITAL_COMMUNITY): Payer: Medicare Other

## 2015-07-25 ENCOUNTER — Encounter (HOSPITAL_COMMUNITY): Payer: Medicare Other

## 2015-07-30 ENCOUNTER — Ambulatory Visit (INDEPENDENT_AMBULATORY_CARE_PROVIDER_SITE_OTHER): Payer: Medicare Other | Admitting: Pharmacist Clinician (PhC)/ Clinical Pharmacy Specialist

## 2015-07-30 DIAGNOSIS — Z5181 Encounter for therapeutic drug level monitoring: Secondary | ICD-10-CM | POA: Diagnosis not present

## 2015-07-30 DIAGNOSIS — I341 Nonrheumatic mitral (valve) prolapse: Secondary | ICD-10-CM

## 2015-07-30 LAB — POCT INR: INR: 3

## 2015-08-26 ENCOUNTER — Encounter: Payer: Medicare Other | Admitting: Pharmacist Clinician (PhC)/ Clinical Pharmacy Specialist

## 2015-08-26 NOTE — Progress Notes (Signed)
This encounter was created in error - please disregard.

## 2015-08-29 ENCOUNTER — Encounter: Payer: Medicare Other | Admitting: Pharmacist Clinician (PhC)/ Clinical Pharmacy Specialist

## 2015-09-01 ENCOUNTER — Ambulatory Visit (INDEPENDENT_AMBULATORY_CARE_PROVIDER_SITE_OTHER): Payer: Medicare Other | Admitting: Pharmacist Clinician (PhC)/ Clinical Pharmacy Specialist

## 2015-09-01 DIAGNOSIS — Z5181 Encounter for therapeutic drug level monitoring: Secondary | ICD-10-CM | POA: Diagnosis not present

## 2015-09-01 DIAGNOSIS — Z7901 Long term (current) use of anticoagulants: Secondary | ICD-10-CM

## 2015-09-01 DIAGNOSIS — I341 Nonrheumatic mitral (valve) prolapse: Secondary | ICD-10-CM | POA: Diagnosis not present

## 2015-09-01 DIAGNOSIS — Z954 Presence of other heart-valve replacement: Secondary | ICD-10-CM

## 2015-09-01 DIAGNOSIS — Z952 Presence of prosthetic heart valve: Secondary | ICD-10-CM

## 2015-09-01 LAB — POCT INR: INR: 2.5

## 2015-09-14 ENCOUNTER — Other Ambulatory Visit: Payer: Self-pay | Admitting: Cardiology

## 2015-09-29 ENCOUNTER — Ambulatory Visit (INDEPENDENT_AMBULATORY_CARE_PROVIDER_SITE_OTHER): Payer: Medicare Other | Admitting: Pharmacist Clinician (PhC)/ Clinical Pharmacy Specialist

## 2015-09-29 DIAGNOSIS — I341 Nonrheumatic mitral (valve) prolapse: Secondary | ICD-10-CM | POA: Diagnosis not present

## 2015-09-29 DIAGNOSIS — Z954 Presence of other heart-valve replacement: Secondary | ICD-10-CM | POA: Diagnosis not present

## 2015-09-29 DIAGNOSIS — Z952 Presence of prosthetic heart valve: Secondary | ICD-10-CM

## 2015-09-29 DIAGNOSIS — Z5181 Encounter for therapeutic drug level monitoring: Secondary | ICD-10-CM

## 2015-09-29 DIAGNOSIS — Z7901 Long term (current) use of anticoagulants: Secondary | ICD-10-CM | POA: Diagnosis not present

## 2015-09-29 LAB — POCT INR: INR: 1.9

## 2015-10-14 ENCOUNTER — Other Ambulatory Visit: Payer: Self-pay | Admitting: *Deleted

## 2015-10-14 MED ORDER — BENAZEPRIL HCL 40 MG PO TABS: 40.0000 mg | ORAL_TABLET | Freq: Every day | ORAL | Status: DC

## 2015-10-27 ENCOUNTER — Ambulatory Visit (INDEPENDENT_AMBULATORY_CARE_PROVIDER_SITE_OTHER): Payer: Medicare Other | Admitting: Pharmacist Clinician (PhC)/ Clinical Pharmacy Specialist

## 2015-10-27 DIAGNOSIS — Z7901 Long term (current) use of anticoagulants: Secondary | ICD-10-CM | POA: Diagnosis not present

## 2015-10-27 DIAGNOSIS — I341 Nonrheumatic mitral (valve) prolapse: Secondary | ICD-10-CM | POA: Diagnosis not present

## 2015-10-27 DIAGNOSIS — Z954 Presence of other heart-valve replacement: Secondary | ICD-10-CM

## 2015-10-27 DIAGNOSIS — Z5181 Encounter for therapeutic drug level monitoring: Secondary | ICD-10-CM

## 2015-10-27 DIAGNOSIS — Z952 Presence of prosthetic heart valve: Secondary | ICD-10-CM

## 2015-10-27 LAB — POCT INR: INR: 2.6

## 2015-11-24 ENCOUNTER — Encounter: Payer: Self-pay | Admitting: Cardiology

## 2015-11-24 ENCOUNTER — Ambulatory Visit (INDEPENDENT_AMBULATORY_CARE_PROVIDER_SITE_OTHER): Payer: Medicare Other | Admitting: Cardiology

## 2015-11-24 ENCOUNTER — Ambulatory Visit (INDEPENDENT_AMBULATORY_CARE_PROVIDER_SITE_OTHER): Payer: Medicare Other | Admitting: Pharmacist

## 2015-11-24 VITALS — BP 154/86 | HR 66 | Ht 66.0 in | Wt 171.2 lb

## 2015-11-24 DIAGNOSIS — Z5181 Encounter for therapeutic drug level monitoring: Secondary | ICD-10-CM | POA: Diagnosis not present

## 2015-11-24 DIAGNOSIS — Z952 Presence of prosthetic heart valve: Secondary | ICD-10-CM

## 2015-11-24 DIAGNOSIS — I341 Nonrheumatic mitral (valve) prolapse: Secondary | ICD-10-CM | POA: Diagnosis not present

## 2015-11-24 DIAGNOSIS — Z7901 Long term (current) use of anticoagulants: Secondary | ICD-10-CM

## 2015-11-24 DIAGNOSIS — I10 Essential (primary) hypertension: Secondary | ICD-10-CM | POA: Diagnosis not present

## 2015-11-24 DIAGNOSIS — I25810 Atherosclerosis of coronary artery bypass graft(s) without angina pectoris: Secondary | ICD-10-CM | POA: Diagnosis not present

## 2015-11-24 DIAGNOSIS — Z954 Presence of other heart-valve replacement: Secondary | ICD-10-CM | POA: Diagnosis not present

## 2015-11-24 DIAGNOSIS — E785 Hyperlipidemia, unspecified: Secondary | ICD-10-CM | POA: Diagnosis not present

## 2015-11-24 LAB — CBC WITH DIFFERENTIAL/PLATELET
BASOS PCT: 1 %
Basophils Absolute: 84 cells/uL (ref 0–200)
EOS ABS: 336 {cells}/uL (ref 15–500)
EOS PCT: 4 %
HCT: 45.2 % (ref 38.5–50.0)
Hemoglobin: 15.2 g/dL (ref 13.2–17.1)
LYMPHS ABS: 1260 {cells}/uL (ref 850–3900)
Lymphocytes Relative: 15 %
MCH: 29.2 pg (ref 27.0–33.0)
MCHC: 33.6 g/dL (ref 32.0–36.0)
MCV: 86.8 fL (ref 80.0–100.0)
MONOS PCT: 6 %
MPV: 10.2 fL (ref 7.5–12.5)
Monocytes Absolute: 504 cells/uL (ref 200–950)
NEUTROS ABS: 6216 {cells}/uL (ref 1500–7800)
Neutrophils Relative %: 74 %
PLATELETS: 267 10*3/uL (ref 140–400)
RBC: 5.21 MIL/uL (ref 4.20–5.80)
RDW: 14.3 % (ref 11.0–15.0)
WBC: 8.4 10*3/uL (ref 3.8–10.8)

## 2015-11-24 LAB — POCT INR: INR: 2.8

## 2015-11-24 NOTE — Patient Instructions (Addendum)
Continue your current therapy  After August 12 you may stop Plavix.  We will check blood work today  Monitor your blood pressure- if it stays over 140 let me know.  I will see you in 6 months

## 2015-11-24 NOTE — Progress Notes (Signed)
Arthur [name] Date of Birth: [birth year]   History of Present Illness: Mr. Schlimgen is seen for followup of CAD. He has a history of coronary disease and is status post CABG in 2005. He had mitral valve repair and a Maze procedure at that time. He subsequently developed recurrent CVA and TEE demonstrated persistent left atrial appendage thrombus despite a history of previous ligation of the appendage. He has been on chronic anticoagulation.  In April 2016 he presented with a near syncopal episode. He was found to be in atrial fibrillation. Managed with rate control and anticoagulation.   He underwent nuclear stress test in 01/03/2015 revealed a normal ejection fraction however there was a moderate sized, severe intensity, reversible inferior defect consistent with moderate inferior ischemia. Coumadin was held with Lovenox bridging and he was brought in for cardiac cath 01/24/15 which showed severe 3v obstructive CAD with patent LIMA-LAD, SVG-diag, SVG-OM3 and occluded SVG-PDA. He had successful stenting of prox RCA with a DES. He remains on Plavix and Coumadin.  On follow up today he is feeling well. No chest pain or SOB. He is an active participant at [facility].He continues to exercise regularly. No new CVA symptoms. No bleeding.    Current Outpatient Prescriptions on File Prior to Visit  Medication Sig Dispense Refill  . amLODipine (NORVASC) 5 MG tablet Take 1 tablet (5 mg total) by mouth daily. 30 tablet 6  . benazepril (LOTENSIN) 40 MG tablet Take 1 tablet (40 mg total) by mouth daily. KEEP OV. 90 tablet 0  . clopidogrel (PLAVIX) 75 MG tablet Take 1 tablet (75 mg total) by mouth daily. 30 tablet 11  . metoprolol tartrate (LOPRESSOR) 25 MG tablet TAKE 1 TABLET (25 MG TOTAL) BY MOUTH 2 (TWO) TIMES DAILY WITH FOOD. 60 tablet 6  . nitroGLYCERIN (NITROSTAT) 0.4 MG SL tablet Place 1 tablet (0.4 mg total) under the tongue every 5 (five) minutes as needed for chest pain (up to 3 doses). 25 tablet 3   . ranitidine (ZANTAC) 150 MG tablet TAKE 1 TABLET (150 MG TOTAL) BY MOUTH 2 (TWO) TIMES DAILY. 60 tablet 6  . rosuvastatin (CRESTOR) 20 MG tablet TAKE 1 TABLET (20 MG TOTAL) BY MOUTH DAILY. 30 tablet 6  . warfarin (COUMADIN) 2.5 MG tablet [name] as directed by anticoagulation clinic (Patient taking differently: Take 1.25-2.5 mg by mouth daily at 6 PM. Take 2.5 mg by mouth on Sun, Mon, Wed, and Friday. Take 1.25 mg by mouth on Tuesday, Thurs, and Saturday.) 30 tablet 11  . warfarin (COUMADIN) 2.5 MG tablet Take 1/2 to 1 tablet by mouth daily as directed by coumadin clinic 30 tablet 3   No current facility-administered medications on file prior to visit.    Allergies  Allergen Reactions  . Lasix [Furosemide] Rash  . Penicillins Rash  . Sulfa Drugs Cross Reactors Rash    Past Medical History  Diagnosis Date  . Hyperlipidemia   . Hypertension   . Coronary artery disease     a. s/p CABG in 2005 with MV repair and MAZE procedure. b. s/p DES to prox RCA 01/2015.   Marland Kitchen MVP (mitral valve prolapse)   . PAF (paroxysmal atrial fibrillation) ([place])   . History of recurrent TIAs   . Hypertensive vascular disease   . Odynophagia   . Esophagitis     Distal esophagitis  . Transudative pleural effusion   . Heart murmur   . GERD (gastroesophageal reflux disease)   . History of hiatal hernia   .  CVA (cerebrovascular accident) [facility]) ~ 2014    RIGHT BRAIN; denies residual on 01/24/2015  . Arthritis     "some in my joints" (01/24/2015)  . CKD (chronic kidney disease), stage II   . Nephrolithiasis     Past Surgical History  Procedure Laterality Date  . Maze  04/2004  . Mitral valve repair  04/2004  . Tee without cardioversion  05/19/2011    Procedure: TRANSESOPHAGEAL ECHOCARDIOGRAM (TEE);  Surgeon: Tyshell Ramberg Martinique, MD;  Location: [facility] ENDOSCOPY;  Service: Cardiovascular;  Laterality: N/A;  . Tonsillectomy and adenoidectomy  1944  . Appendectomy  1960's  . Cardiac catheterization  03/19/2004  . Coronary  angioplasty with stent placement  01/24/2015    "1 stent"  . Coronary artery bypass graft  04/2004    LIMA GRAFT TO THE DISTAL LAD, SAPHENOUS VEIN GRAFT TO THE FIRST DIADGONAL BRANCH, SAPHENOUS VEIN GRAFT TO THE THIRD MARIGINAL BRANCH, AND SAPHENOUS VEIN GRAFT TO THE PDA  . Cardiac catheterization N/A 01/24/2015    Procedure: Left Heart Cath and Coronary Angiography;  Surgeon: Tala Eber M Martinique, MD;  Location: [facility] LAB;  Service: Cardiovascular;  Laterality: N/A;  . Cardiac catheterization N/A 01/24/2015    Procedure: Coronary Stent Intervention;  Surgeon: Deliliah Spranger M Martinique, MD;  Location: [facility] LAB;  Service: Cardiovascular;  Laterality: N/A;    History  Smoking status  . Former Smoker -- 3 years  . Types: Cigarettes  . Quit date: 06/15/1955  Smokeless tobacco  . Never Used    History  Alcohol Use No    Family History  Problem Relation Age of Onset  . Heart attack Mother   . Stroke Father   . Hypertension Father   . Kidney failure Father     Review of Systems: As noted in history of present illness.  All other systems were reviewed and are negative.  Physical Exam: BP 154/86 mmHg  Pulse 66  Ht 5\' 6"  (1.676 m)  Wt 171 lb 3.2 oz (77.656 kg)  BMI 27.65 kg/m2 He is a pleasant elderly male in no apparent distress. HEENT is normal.  Neck is without JVD, adenopathy, thyromegaly, or bruits. Lungs are clear. Cardiac exam reveals an irregular rate and rhythm without gallop, murmur, or click. Normal S1-2. No gallop or murmur. Abdomen is soft and nontender without masses or bruits. His femoral and pedal pulses are 2+. He has no edema. He is alert and oriented x3 and cranial nerves II through XII are intact.  LABORATORY DATA:   Lab Results  Component Value Date   WBC 7.0 01/25/2015   HGB 13.5 01/25/2015   HCT 40.6 01/25/2015   PLT 182 01/25/2015   GLUCOSE 93 01/25/2015   CHOL 158 06/17/2014   TRIG 103.0 06/17/2014   HDL 43.20 06/17/2014   LDLCALC 94 06/17/2014    ALT 21 06/17/2014   AST 28 06/17/2014   NA 137 01/25/2015   K 3.7 01/25/2015   CL 108 01/25/2015   CREATININE 1.22 01/25/2015   BUN 17 01/25/2015   CO2 23 01/25/2015   TSH 0.293* 05/14/2011   INR 2.8 11/24/2015   HGBA1C 5.6 05/14/2011    Assessment / Plan: 1. History of CVA. TEE documented a left atrial appendage thrombus despite prior ligation of the atrial appendage. He is on chronic anticoagulation and this is adjusted  by pharmacy. INR 2.8 today.  2. Coronary disease status post CABG in 2005. He also had a Maze procedure at that time.  Cardiac cath in  August 2016 showed all grafts were patent except SVG to the RCA that was occluded. He underwent successful stenting of the proximal RCA with DES. Plan to continue Plavix until August then discontinue and stay on Coumadin only.  3. Atrial fibrillation-status post Maze procedure. He was in Afib in April  which may have contributed to his near syncopal episode. Will continue metoprolol and coumadin. He was back in NSR on follow up in September.  4. Hypercholesterolemia. On chronic Crestor.   5. HTN. Blood pressure elevated today. Patient will monitor closer at home and let me know if it persists over [id number] systolic.   Will check  fasting lab work and CBC today.

## 2015-11-25 LAB — HEPATIC FUNCTION PANEL
ALT: 16 U/L (ref 9–46)
AST: 21 U/L (ref 10–35)
Albumin: 4.3 g/dL (ref 3.6–5.1)
Alkaline Phosphatase: 60 U/L (ref 40–115)
BILIRUBIN DIRECT: 0.1 mg/dL (ref <=0.2)
BILIRUBIN INDIRECT: 0.6 mg/dL (ref 0.2–1.2)
BILIRUBIN TOTAL: 0.7 mg/dL (ref 0.2–1.2)
TOTAL PROTEIN: 6.8 g/dL (ref 6.1–8.1)

## 2015-11-25 LAB — BASIC METABOLIC PANEL
BUN: 17 mg/dL (ref 7–25)
CHLORIDE: 103 mmol/L (ref 98–110)
CO2: 21 mmol/L (ref 20–31)
Calcium: 9.7 mg/dL (ref 8.6–10.3)
Creat: 1.36 mg/dL — ABNORMAL HIGH (ref 0.70–1.11)
GLUCOSE: 87 mg/dL (ref 65–99)
Potassium: 4.5 mmol/L (ref 3.5–5.3)
Sodium: 139 mmol/L (ref 135–146)

## 2015-11-25 LAB — LIPID PANEL
Cholesterol: 151 mg/dL (ref 125–200)
HDL: 50 mg/dL (ref >=40)
LDL CALC: 78 mg/dL (ref <130)
TRIGLYCERIDES: 113 mg/dL (ref <150)
Total CHOL/HDL Ratio: 3 Ratio (ref <=5.0)
VLDL: 23 mg/dL (ref <30)

## 2015-12-22 ENCOUNTER — Ambulatory Visit (INDEPENDENT_AMBULATORY_CARE_PROVIDER_SITE_OTHER): Payer: Medicare Other | Admitting: Pharmacist Clinician (PhC)/ Clinical Pharmacy Specialist

## 2015-12-22 ENCOUNTER — Telehealth: Payer: Self-pay | Admitting: Cardiology

## 2015-12-22 ENCOUNTER — Telehealth: Payer: Self-pay

## 2015-12-22 ENCOUNTER — Other Ambulatory Visit: Payer: Self-pay | Admitting: Cardiology

## 2015-12-22 DIAGNOSIS — Z5181 Encounter for therapeutic drug level monitoring: Secondary | ICD-10-CM

## 2015-12-22 DIAGNOSIS — Z952 Presence of prosthetic heart valve: Secondary | ICD-10-CM

## 2015-12-22 DIAGNOSIS — Z954 Presence of other heart-valve replacement: Secondary | ICD-10-CM | POA: Diagnosis not present

## 2015-12-22 DIAGNOSIS — Z7901 Long term (current) use of anticoagulants: Secondary | ICD-10-CM

## 2015-12-22 DIAGNOSIS — I341 Nonrheumatic mitral (valve) prolapse: Secondary | ICD-10-CM | POA: Diagnosis not present

## 2015-12-22 LAB — POCT INR: INR: 2.3

## 2015-12-22 MED ORDER — CARVEDILOL 25 MG PO TABS: 25.0000 mg | ORAL_TABLET | Freq: Two times a day (BID) | ORAL | Status: DC

## 2015-12-22 NOTE — Telephone Encounter (Signed)
Follow-up     The pt is returning Arthur [name] call

## 2015-12-22 NOTE — Telephone Encounter (Signed)
Received a call from patient.Dr.Jordan reviewed B/P readings he advised to stop metoprolol and start coreg 25 mg twice a day.Advised continue to monitor B/P and call back if continues to be elevated.

## 2015-12-22 NOTE — Telephone Encounter (Signed)
Patient called left message on personal voice mail Dr.Jordan reviewed B/P readings you brought by the office this morning.Advised to call me back he wants to change your medications.

## 2016-01-02 ENCOUNTER — Other Ambulatory Visit: Payer: Self-pay | Admitting: Cardiology

## 2016-01-02 NOTE — Telephone Encounter (Signed)
REFILL 

## 2016-01-17 ENCOUNTER — Other Ambulatory Visit: Payer: Self-pay | Admitting: Physician Assistant

## 2016-01-19 NOTE — Telephone Encounter (Signed)
Rx(s) sent to pharmacy electronically.  

## 2016-02-02 ENCOUNTER — Telehealth: Payer: Self-pay

## 2016-02-02 ENCOUNTER — Ambulatory Visit (INDEPENDENT_AMBULATORY_CARE_PROVIDER_SITE_OTHER): Payer: Medicare Other | Admitting: Pharmacist Clinician (PhC)/ Clinical Pharmacy Specialist

## 2016-02-02 DIAGNOSIS — I341 Nonrheumatic mitral (valve) prolapse: Secondary | ICD-10-CM | POA: Diagnosis not present

## 2016-02-02 DIAGNOSIS — Z954 Presence of other heart-valve replacement: Secondary | ICD-10-CM

## 2016-02-02 DIAGNOSIS — Z5181 Encounter for therapeutic drug level monitoring: Secondary | ICD-10-CM | POA: Diagnosis not present

## 2016-02-02 DIAGNOSIS — Z7901 Long term (current) use of anticoagulants: Secondary | ICD-10-CM | POA: Diagnosis not present

## 2016-02-02 DIAGNOSIS — Z952 Presence of prosthetic heart valve: Secondary | ICD-10-CM

## 2016-02-02 LAB — POCT INR: INR: 2.8

## 2016-02-02 NOTE — Telephone Encounter (Signed)
Spoke to patient.I received your B/P readings from home.Dr.Jordan advised continue same medications.

## 2016-02-04 ENCOUNTER — Telehealth: Payer: Self-pay | Admitting: Cardiology

## 2016-02-04 NOTE — Telephone Encounter (Signed)
Please call,he have a couple questions he needs to ask.

## 2016-02-04 NOTE — Telephone Encounter (Signed)
Returned call to patient he wanted to make sure he was suppose to stop plavix after 01/24/16.Advised that is correct.

## 2016-03-06 ENCOUNTER — Other Ambulatory Visit: Payer: Self-pay | Admitting: Cardiology

## 2016-03-15 ENCOUNTER — Ambulatory Visit (INDEPENDENT_AMBULATORY_CARE_PROVIDER_SITE_OTHER): Payer: Medicare Other | Admitting: Pharmacist Clinician (PhC)/ Clinical Pharmacy Specialist

## 2016-03-15 DIAGNOSIS — Z7901 Long term (current) use of anticoagulants: Secondary | ICD-10-CM

## 2016-03-15 DIAGNOSIS — Z5181 Encounter for therapeutic drug level monitoring: Secondary | ICD-10-CM

## 2016-03-15 DIAGNOSIS — I341 Nonrheumatic mitral (valve) prolapse: Secondary | ICD-10-CM

## 2016-03-15 DIAGNOSIS — Z952 Presence of prosthetic heart valve: Secondary | ICD-10-CM | POA: Diagnosis not present

## 2016-03-15 LAB — POCT INR: INR: 2.2

## 2016-04-06 ENCOUNTER — Other Ambulatory Visit: Payer: Self-pay | Admitting: Cardiology

## 2016-04-10 ENCOUNTER — Other Ambulatory Visit: Payer: Self-pay | Admitting: Cardiology

## 2016-04-12 ENCOUNTER — Other Ambulatory Visit: Payer: Self-pay

## 2016-04-12 MED ORDER — ROSUVASTATIN CALCIUM 20 MG PO TABS: 20.0000 mg | ORAL_TABLET | Freq: Every day | ORAL | 2 refills | Status: DC

## 2016-04-12 NOTE — Telephone Encounter (Signed)
Rx(s) sent to pharmacy electronically.  

## 2016-04-26 ENCOUNTER — Ambulatory Visit (INDEPENDENT_AMBULATORY_CARE_PROVIDER_SITE_OTHER): Payer: Medicare Other | Admitting: Pharmacist Clinician (PhC)/ Clinical Pharmacy Specialist

## 2016-04-26 DIAGNOSIS — I341 Nonrheumatic mitral (valve) prolapse: Secondary | ICD-10-CM | POA: Diagnosis not present

## 2016-04-26 DIAGNOSIS — Z952 Presence of prosthetic heart valve: Secondary | ICD-10-CM

## 2016-04-26 DIAGNOSIS — Z7901 Long term (current) use of anticoagulants: Secondary | ICD-10-CM

## 2016-04-26 DIAGNOSIS — Z5181 Encounter for therapeutic drug level monitoring: Secondary | ICD-10-CM | POA: Diagnosis not present

## 2016-04-26 LAB — POCT INR: INR: 4.8

## 2016-05-10 ENCOUNTER — Ambulatory Visit (INDEPENDENT_AMBULATORY_CARE_PROVIDER_SITE_OTHER): Payer: Medicare Other | Admitting: Pharmacist Clinician (PhC)/ Clinical Pharmacy Specialist

## 2016-05-10 DIAGNOSIS — Z7901 Long term (current) use of anticoagulants: Secondary | ICD-10-CM | POA: Diagnosis not present

## 2016-05-10 DIAGNOSIS — Z5181 Encounter for therapeutic drug level monitoring: Secondary | ICD-10-CM | POA: Diagnosis not present

## 2016-05-10 DIAGNOSIS — I341 Nonrheumatic mitral (valve) prolapse: Secondary | ICD-10-CM

## 2016-05-10 DIAGNOSIS — Z952 Presence of prosthetic heart valve: Secondary | ICD-10-CM

## 2016-05-10 LAB — POCT INR: INR: 2.8

## 2016-05-27 NOTE — Progress Notes (Signed)
Arthur [name] Date of Birth: [birth year]   History of Present Illness: Arthur [name] is seen for followup of CAD. He has a history of coronary disease and is status post CABG in 2005. He had mitral valve repair and a Maze procedure at that time. He subsequently developed recurrent CVA and TEE demonstrated persistent left atrial appendage thrombus despite a history of previous ligation of the appendage. He has been on chronic anticoagulation.  In April 2016 he presented with a near syncopal episode. He was found to be in atrial fibrillation. Managed with rate control and anticoagulation.   He underwent nuclear stress test in 01/03/2015 revealed a normal ejection fraction however there was a moderate sized, severe intensity, reversible inferior defect consistent with moderate inferior ischemia. Cardiac cath 01/24/15 which showed severe 3v obstructive CAD with patent LIMA-LAD, SVG-diag, SVG-OM3 and occluded SVG-PDA. He had successful stenting of prox RCA with a DES. He remains on Plavix and Coumadin.  On follow up today he is feeling well. No chest pain or SOB. He is an active with his exercise.No new CVA symptoms. No bleeding. Denies any palpitations. Reports BP readings at home are never over [id number] systolic.   Current Outpatient Prescriptions on File Prior to Visit  Medication Sig Dispense Refill  . amLODipine (NORVASC) 5 MG tablet TAKE 1 TABLET (5 MG TOTAL) BY MOUTH DAILY. 90 tablet 1  . benazepril (LOTENSIN) 40 MG tablet Take 1 tablet (40 mg total) by mouth daily. 90 tablet 1  . carvedilol (COREG) 25 MG tablet Take 1 tablet (25 mg total) by mouth 2 (two) times daily. 60 tablet 6  . nitroGLYCERIN (NITROSTAT) 0.4 MG SL tablet Place 1 tablet (0.4 mg total) under the tongue every 5 (five) minutes as needed for chest pain (up to 3 doses). 25 tablet 3  . ranitidine (ZANTAC) 150 MG tablet TAKE 1 TABLET (150 MG TOTAL) BY MOUTH 2 (TWO) TIMES DAILY. 60 tablet 5  . rosuvastatin (CRESTOR) 20 MG tablet Take 1  tablet (20 mg total) by mouth daily. 90 tablet 2  . warfarin (COUMADIN) 2.5 MG tablet TAKE 1/2 TO 1 TABLET BY MOUTH DAILY AS DIRECTED BY COUMADIN CLINIC 30 tablet 3   No current facility-administered medications on file prior to visit.     Allergies  Allergen Reactions  . Lasix [Furosemide] Rash  . Penicillins Rash  . Sulfa Drugs Cross Reactors Rash    Past Medical History:  Diagnosis Date  . Arthritis    "some in my joints" (01/24/2015)  . CKD (chronic kidney disease), stage II   . Coronary artery disease    a. s/p CABG in 2005 with MV repair and MAZE procedure. b. s/p DES to prox RCA 01/2015.   Marland Kitchen CVA (cerebrovascular accident) [facility]) ~ 2014   RIGHT BRAIN; denies residual on 01/24/2015  . Esophagitis    Distal esophagitis  . GERD (gastroesophageal reflux disease)   . Heart murmur   . History of hiatal hernia   . History of recurrent TIAs   . Hyperlipidemia   . Hypertension   . Hypertensive vascular disease   . MVP (mitral valve prolapse)   . Nephrolithiasis   . Odynophagia   . PAF (paroxysmal atrial fibrillation) ([place])   . Transudative pleural effusion     Past Surgical History:  Procedure Laterality Date  . APPENDECTOMY  1960's  . CARDIAC CATHETERIZATION  03/19/2004  . CARDIAC CATHETERIZATION N/A 01/24/2015   Procedure: Left Heart Cath and Coronary Angiography;  Surgeon: Ander Slade  Martinique, MD;  Location: [place] [facility] LAB;  Service: Cardiovascular;  Laterality: N/A;  . CARDIAC CATHETERIZATION N/A 01/24/2015   Procedure: Coronary Stent Intervention;  Surgeon: Erielle Gawronski M Martinique, MD;  Location: [place] [facility] LAB;  Service: Cardiovascular;  Laterality: N/A;  . CORONARY ANGIOPLASTY WITH STENT PLACEMENT  01/24/2015   "1 stent"  . CORONARY ARTERY BYPASS GRAFT  04/2004   LIMA GRAFT TO THE DISTAL LAD, SAPHENOUS VEIN GRAFT TO THE FIRST DIADGONAL BRANCH, SAPHENOUS VEIN GRAFT TO THE THIRD MARIGINAL BRANCH, AND SAPHENOUS VEIN GRAFT TO THE PDA  . MAZE  04/2004  . MITRAL VALVE REPAIR   04/2004  . TEE WITHOUT CARDIOVERSION  05/19/2011   Procedure: TRANSESOPHAGEAL ECHOCARDIOGRAM (TEE);  Surgeon: Klare Criss Martinique, MD;  Location: [facility];  Service: Cardiovascular;  Laterality: N/A;  . TONSILLECTOMY AND ADENOIDECTOMY  1944    History  Smoking Status  . Former Smoker  . Years: 3.00  . Types: Cigarettes  . Quit date: 06/15/1955  Smokeless Tobacco  . Never Used    History  Alcohol Use No    Family History  Problem Relation Age of Onset  . Heart attack Mother   . Stroke Father   . Hypertension Father   . Kidney failure Father     Review of Systems: As noted in history of present illness.  All other systems were reviewed and are negative.  Physical Exam: BP (!) 154/78   Pulse (!) 58   Ht 5\' 6"  (1.676 m)   Wt 175 lb 3.2 oz (79.5 kg)   BMI 28.28 kg/m  He is a pleasant elderly male in no apparent distress. HEENT is normal.  Neck is without JVD, adenopathy, thyromegaly, or bruits. Lungs are clear. Cardiac exam reveals an irregular rate and rhythm without gallop, murmur, or click. Normal S1-2. No gallop or murmur. Abdomen is soft and nontender without masses or bruits. His femoral and pedal pulses are 2+. He has no edema. He is alert and oriented x3 and cranial nerves II through XII are intact.  LABORATORY DATA:   Lab Results  Component Value Date   WBC 8.4 11/24/2015   HGB 15.2 11/24/2015   HCT 45.2 11/24/2015   PLT 267 11/24/2015   GLUCOSE 87 11/24/2015   CHOL 151 11/24/2015   TRIG 113 11/24/2015   HDL 50 11/24/2015   LDLCALC 78 11/24/2015   ALT 16 11/24/2015   AST 21 11/24/2015   NA 139 11/24/2015   K 4.5 11/24/2015   CL 103 11/24/2015   CREATININE 1.36 (H) 11/24/2015   BUN 17 11/24/2015   CO2 21 11/24/2015   TSH 0.293 (L) 05/14/2011   INR 2.8 05/10/2016   HGBA1C 5.6 05/14/2011   Ecg today shows NSR with first degree AV block. Old anteroseptal infarct. I have personally reviewed and interpreted this study.  Assessment / Plan: 1. History of  CVA. TEE documented a left atrial appendage thrombus despite prior ligation of the atrial appendage. He is on chronic anticoagulation and this is adjusted  by pharmacy.   2. Coronary disease status post CABG in 2005. He also had a Maze procedure at that time.  Cardiac cath in August 2016 showed all grafts were patent except SVG to the RCA that was occluded. He underwent successful stenting of the proximal RCA with DES. He is asymptomatic.   3. Atrial fibrillation-status post Maze procedure. He was in Afib in April  which may have contributed to his near syncopal episode. Will continue metoprolol and coumadin. He  is in NSR today.  4. Hypercholesterolemia. On chronic Crestor. Well controlled.   5. HTN. Blood pressure elevated today. Patient reports good readings at home. Will monitor.   Follow up in 6 months with lab work.

## 2016-05-28 ENCOUNTER — Ambulatory Visit (INDEPENDENT_AMBULATORY_CARE_PROVIDER_SITE_OTHER): Payer: Medicare Other | Admitting: Cardiology

## 2016-05-28 ENCOUNTER — Ambulatory Visit (INDEPENDENT_AMBULATORY_CARE_PROVIDER_SITE_OTHER): Payer: Medicare Other | Admitting: Pharmacist

## 2016-05-28 ENCOUNTER — Encounter: Payer: Self-pay | Admitting: Cardiology

## 2016-05-28 VITALS — BP 154/78 | HR 58 | Ht 66.0 in | Wt 175.2 lb

## 2016-05-28 DIAGNOSIS — I480 Paroxysmal atrial fibrillation: Secondary | ICD-10-CM

## 2016-05-28 DIAGNOSIS — E7800 Pure hypercholesterolemia, unspecified: Secondary | ICD-10-CM

## 2016-05-28 DIAGNOSIS — Z7901 Long term (current) use of anticoagulants: Secondary | ICD-10-CM

## 2016-05-28 DIAGNOSIS — Z952 Presence of prosthetic heart valve: Secondary | ICD-10-CM | POA: Diagnosis not present

## 2016-05-28 DIAGNOSIS — Z5181 Encounter for therapeutic drug level monitoring: Secondary | ICD-10-CM

## 2016-05-28 DIAGNOSIS — I341 Nonrheumatic mitral (valve) prolapse: Secondary | ICD-10-CM | POA: Diagnosis not present

## 2016-05-28 DIAGNOSIS — I25810 Atherosclerosis of coronary artery bypass graft(s) without angina pectoris: Secondary | ICD-10-CM | POA: Diagnosis not present

## 2016-05-28 DIAGNOSIS — I10 Essential (primary) hypertension: Secondary | ICD-10-CM | POA: Diagnosis not present

## 2016-05-28 LAB — POCT INR: INR: 3.1

## 2016-05-28 NOTE — Patient Instructions (Signed)
Continue your current therapy  I will see you in 6 months with lab work   

## 2016-06-18 ENCOUNTER — Other Ambulatory Visit: Payer: Self-pay | Admitting: Pharmacist

## 2016-06-18 ENCOUNTER — Ambulatory Visit (INDEPENDENT_AMBULATORY_CARE_PROVIDER_SITE_OTHER): Payer: Medicare Other | Admitting: Pharmacist

## 2016-06-18 DIAGNOSIS — I341 Nonrheumatic mitral (valve) prolapse: Secondary | ICD-10-CM

## 2016-06-18 DIAGNOSIS — Z952 Presence of prosthetic heart valve: Secondary | ICD-10-CM

## 2016-06-18 DIAGNOSIS — Z5181 Encounter for therapeutic drug level monitoring: Secondary | ICD-10-CM | POA: Diagnosis not present

## 2016-06-18 DIAGNOSIS — Z7901 Long term (current) use of anticoagulants: Secondary | ICD-10-CM

## 2016-06-18 LAB — POCT INR: INR: 3.2

## 2016-07-05 ENCOUNTER — Other Ambulatory Visit: Payer: Self-pay | Admitting: Pharmacist Clinician (PhC)/ Clinical Pharmacy Specialist

## 2016-07-05 ENCOUNTER — Other Ambulatory Visit: Payer: Self-pay | Admitting: Cardiology

## 2016-07-05 MED ORDER — WARFARIN SODIUM 2.5 MG PO TABS: ORAL_TABLET | ORAL | 3 refills | Status: DC

## 2016-07-05 NOTE — Telephone Encounter (Signed)
Rx(s) sent to pharmacy electronically.  

## 2016-07-08 ENCOUNTER — Other Ambulatory Visit: Payer: Self-pay | Admitting: Cardiology

## 2016-07-09 ENCOUNTER — Ambulatory Visit (INDEPENDENT_AMBULATORY_CARE_PROVIDER_SITE_OTHER): Payer: Medicare Other | Admitting: Pharmacist Clinician (PhC)/ Clinical Pharmacy Specialist

## 2016-07-09 DIAGNOSIS — I341 Nonrheumatic mitral (valve) prolapse: Secondary | ICD-10-CM | POA: Diagnosis not present

## 2016-07-09 DIAGNOSIS — Z7901 Long term (current) use of anticoagulants: Secondary | ICD-10-CM

## 2016-07-09 DIAGNOSIS — Z5181 Encounter for therapeutic drug level monitoring: Secondary | ICD-10-CM | POA: Diagnosis not present

## 2016-07-09 DIAGNOSIS — Z952 Presence of prosthetic heart valve: Secondary | ICD-10-CM

## 2016-07-09 LAB — POCT INR: INR: 2.1

## 2016-07-17 ENCOUNTER — Other Ambulatory Visit: Payer: Self-pay | Admitting: Cardiology

## 2016-07-19 NOTE — Telephone Encounter (Signed)
Rx(s) sent to pharmacy electronically.  

## 2016-08-06 ENCOUNTER — Ambulatory Visit (INDEPENDENT_AMBULATORY_CARE_PROVIDER_SITE_OTHER): Payer: Medicare Other | Admitting: Pharmacist

## 2016-08-06 DIAGNOSIS — Z952 Presence of prosthetic heart valve: Secondary | ICD-10-CM

## 2016-08-06 DIAGNOSIS — Z5181 Encounter for therapeutic drug level monitoring: Secondary | ICD-10-CM | POA: Diagnosis not present

## 2016-08-06 DIAGNOSIS — I341 Nonrheumatic mitral (valve) prolapse: Secondary | ICD-10-CM

## 2016-08-06 DIAGNOSIS — Z7901 Long term (current) use of anticoagulants: Secondary | ICD-10-CM | POA: Diagnosis not present

## 2016-08-06 LAB — POCT INR: INR: 2.5

## 2016-09-17 ENCOUNTER — Ambulatory Visit (INDEPENDENT_AMBULATORY_CARE_PROVIDER_SITE_OTHER): Payer: Medicare Other | Admitting: Pharmacist

## 2016-09-17 DIAGNOSIS — Z7901 Long term (current) use of anticoagulants: Secondary | ICD-10-CM

## 2016-09-17 DIAGNOSIS — I341 Nonrheumatic mitral (valve) prolapse: Secondary | ICD-10-CM | POA: Diagnosis not present

## 2016-09-17 DIAGNOSIS — Z5181 Encounter for therapeutic drug level monitoring: Secondary | ICD-10-CM

## 2016-09-17 DIAGNOSIS — Z952 Presence of prosthetic heart valve: Secondary | ICD-10-CM | POA: Diagnosis not present

## 2016-09-17 LAB — POCT INR: INR: 2.5

## 2016-10-01 ENCOUNTER — Other Ambulatory Visit: Payer: Self-pay

## 2016-10-01 MED ORDER — AMLODIPINE BESYLATE 5 MG PO TABS: 5.0000 mg | ORAL_TABLET | Freq: Every day | ORAL | 2 refills | Status: DC

## 2016-10-08 ENCOUNTER — Other Ambulatory Visit: Payer: Self-pay | Admitting: Cardiology

## 2016-10-08 NOTE — Telephone Encounter (Signed)
REFILL 

## 2016-11-05 ENCOUNTER — Ambulatory Visit (INDEPENDENT_AMBULATORY_CARE_PROVIDER_SITE_OTHER): Payer: Medicare Other | Admitting: Pharmacist Clinician (PhC)/ Clinical Pharmacy Specialist

## 2016-11-05 DIAGNOSIS — Z7901 Long term (current) use of anticoagulants: Secondary | ICD-10-CM

## 2016-11-05 DIAGNOSIS — I341 Nonrheumatic mitral (valve) prolapse: Secondary | ICD-10-CM

## 2016-11-05 DIAGNOSIS — Z952 Presence of prosthetic heart valve: Secondary | ICD-10-CM

## 2016-11-05 DIAGNOSIS — Z5181 Encounter for therapeutic drug level monitoring: Secondary | ICD-10-CM | POA: Diagnosis not present

## 2016-11-05 LAB — POCT INR: INR: 2.9

## 2016-11-16 ENCOUNTER — Telehealth: Payer: Self-pay | Admitting: Cardiology

## 2016-11-16 NOTE — Telephone Encounter (Signed)
Returned call to patient.Stated he will have fasting lab done at [facility] lab.Stated he received orders I mailed to him.He will have done 11/17/16.

## 2016-11-16 NOTE — Telephone Encounter (Signed)
New message     Patient would like to speak to you about his lab work

## 2016-11-17 LAB — HEPATIC FUNCTION PANEL
ALT: 15 U/L (ref 9–46)
AST: 19 U/L (ref 10–35)
Albumin: 4.3 g/dL (ref 3.6–5.1)
Alkaline Phosphatase: 49 U/L (ref 40–115)
BILIRUBIN TOTAL: 0.7 mg/dL (ref 0.2–1.2)
Bilirubin, Direct: 0.2 mg/dL (ref <=0.2)
Indirect Bilirubin: 0.5 mg/dL (ref 0.2–1.2)
Total Protein: 6.4 g/dL (ref 6.1–8.1)

## 2016-11-17 LAB — CBC WITH DIFFERENTIAL/PLATELET
BASOS PCT: 1 %
Basophils Absolute: 49 cells/uL (ref 0–200)
EOS PCT: 7 %
Eosinophils Absolute: 343 cells/uL (ref 15–500)
HCT: 43.7 % (ref 38.5–50.0)
Hemoglobin: 14.2 g/dL (ref 13.2–17.1)
LYMPHS PCT: 23 %
Lymphs Abs: 1127 cells/uL (ref 850–3900)
MCH: 29.1 pg (ref 27.0–33.0)
MCHC: 32.5 g/dL (ref 32.0–36.0)
MCV: 89.5 fL (ref 80.0–100.0)
MONOS PCT: 10 %
MPV: 10.4 fL (ref 7.5–12.5)
Monocytes Absolute: 490 cells/uL (ref 200–950)
NEUTROS PCT: 59 %
Neutro Abs: 2891 cells/uL (ref 1500–7800)
PLATELETS: 234 10*3/uL (ref 140–400)
RBC: 4.88 MIL/uL (ref 4.20–5.80)
RDW: 14.6 % (ref 11.0–15.0)
WBC: 4.9 10*3/uL (ref 3.8–10.8)

## 2016-11-17 LAB — LIPID PANEL
CHOL/HDL RATIO: 3 ratio (ref <5.0)
Cholesterol: 137 mg/dL (ref <200)
HDL: 46 mg/dL (ref >40)
LDL CALC: 75 mg/dL (ref <100)
Triglycerides: 81 mg/dL (ref <150)
VLDL: 16 mg/dL (ref <30)

## 2016-11-17 LAB — BASIC METABOLIC PANEL
BUN: 22 mg/dL (ref 7–25)
CHLORIDE: 106 mmol/L (ref 98–110)
CO2: 25 mmol/L (ref 20–31)
Calcium: 9.1 mg/dL (ref 8.6–10.3)
Creat: 1.24 mg/dL — ABNORMAL HIGH (ref 0.70–1.11)
Glucose, Bld: 93 mg/dL (ref 65–99)
Potassium: 4.7 mmol/L (ref 3.5–5.3)
SODIUM: 140 mmol/L (ref 135–146)

## 2016-11-25 NOTE — Progress Notes (Signed)
Arthur [name] Date of Birth: [birth year]   History of Present Illness: Arthur [name] is seen for followup of CAD. He has a history of coronary disease and is status post CABG in 2005. He had mitral valve repair and a Maze procedure at that time. He subsequently developed recurrent CVA and TEE demonstrated persistent left atrial appendage thrombus despite a history of previous ligation of the appendage. He has been on chronic anticoagulation.  In April 2016 he presented with a near syncopal episode. He was found to be in atrial fibrillation. Managed with rate control and anticoagulation.    He underwent nuclear stress test in 01/03/2015 revealed a normal ejection fraction however there was a moderate sized, severe intensity, reversible inferior defect consistent with moderate inferior ischemia. Cardiac cath 01/24/15 which showed severe 3v obstructive CAD with patent LIMA-LAD, SVG-diag, SVG-OM3 and occluded SVG-PDA. He had successful stenting of prox RCA with a DES.   On follow up today he is doing OK. No chest pain or SOB. He is an active with his exercise. No new CVA symptoms.  Denies any palpitations. He has been under increased stress helping take care of his 78 yo sister in law who fell and broke her hip.    Current Outpatient Prescriptions on File Prior to Visit  Medication Sig Dispense Refill  . amLODipine (NORVASC) 5 MG tablet Take 1 tablet (5 mg total) by mouth daily. 90 tablet 2  . benazepril (LOTENSIN) 40 MG tablet TAKE ONE TABLET BY MOUTH DAILY 90 tablet 0  . carvedilol (COREG) 25 MG tablet TAKE ONE TABLET BY MOUTH TWICE A DAY 60 tablet 9  . nitroGLYCERIN (NITROSTAT) 0.4 MG SL tablet Place 1 tablet (0.4 mg total) under the tongue every 5 (five) minutes as needed for chest pain (up to 3 doses). 25 tablet 3  . ranitidine (ZANTAC) 150 MG tablet TAKE 1 TABLET (150 MG TOTAL) BY MOUTH 2 (TWO) TIMES DAILY. 60 tablet 6  . rosuvastatin (CRESTOR) 20 MG tablet Take 1 tablet (20 mg total) by mouth  daily. 90 tablet 2  . warfarin (COUMADIN) 2.5 MG tablet TAKE 1/2 TO 1 TABLET BY MOUTH DAILY AS DIRECTED BY COUMADIN CLINIC 30 tablet 2   No current facility-administered medications on file prior to visit.     Allergies  Allergen Reactions  . Lasix [Furosemide] Rash  . Penicillins Rash  . Sulfa Drugs Cross Reactors Rash    Past Medical History:  Diagnosis Date  . Arthritis    "some in my joints" (01/24/2015)  . CKD (chronic kidney disease), stage II   . Coronary artery disease    a. s/p CABG in 2005 with MV repair and MAZE procedure. b. s/p DES to prox RCA 01/2015.   Marland Kitchen CVA (cerebrovascular accident) [facility]) ~ 2014   RIGHT BRAIN; denies residual on 01/24/2015  . Esophagitis    Distal esophagitis  . GERD (gastroesophageal reflux disease)   . Heart murmur   . History of hiatal hernia   . History of recurrent TIAs   . Hyperlipidemia   . Hypertension   . Hypertensive vascular disease   . MVP (mitral valve prolapse)   . Nephrolithiasis   . Odynophagia   . PAF (paroxysmal atrial fibrillation) ([place])   . Transudative pleural effusion     Past Surgical History:  Procedure Laterality Date  . APPENDECTOMY  1960's  . CARDIAC CATHETERIZATION  03/19/2004  . CARDIAC CATHETERIZATION N/A 01/24/2015   Procedure: Left Heart Cath and Coronary Angiography;  Surgeon:  Savina Olshefski M Martinique, MD;  Location: [place] [facility];  Service: Cardiovascular;  Laterality: N/A;  . CARDIAC CATHETERIZATION N/A 01/24/2015   Procedure: Coronary Stent Intervention;  Surgeon: Jasmen Emrich M Martinique, MD;  Location: [place] [facility];  Service: Cardiovascular;  Laterality: N/A;  . CORONARY ANGIOPLASTY WITH STENT PLACEMENT  01/24/2015   "1 stent"  . CORONARY ARTERY BYPASS GRAFT  04/2004   LIMA GRAFT TO THE DISTAL LAD, SAPHENOUS VEIN GRAFT TO THE FIRST DIADGONAL BRANCH, SAPHENOUS VEIN GRAFT TO THE THIRD MARIGINAL BRANCH, AND SAPHENOUS VEIN GRAFT TO THE PDA  . MAZE  04/2004  . MITRAL VALVE REPAIR  04/2004  . TEE WITHOUT  CARDIOVERSION  05/19/2011   Procedure: TRANSESOPHAGEAL ECHOCARDIOGRAM (TEE);  Surgeon: Danell Vazquez Martinique, MD;  Location: [place];  Service: Cardiovascular;  Laterality: N/A;  . TONSILLECTOMY AND ADENOIDECTOMY  1944    History  Smoking Status  . Former Smoker  . Years: 3.00  . Types: Cigarettes  . Quit date: 06/15/1955  Smokeless Tobacco  . Never Used    History  Alcohol Use No    Family History  Problem Relation Age of Onset  . Heart attack Mother   . Stroke Father   . Hypertension Father   . Kidney failure Father     Review of Systems: As noted in history of present illness.  All other systems were reviewed and are negative.  Physical Exam: BP (!) 143/72   Pulse 65   Ht 5\' 7"  (1.702 m)   Wt 171 lb 12.8 oz (77.9 kg)   BMI 26.91 kg/m  He is a pleasant elderly male in no apparent distress. HEENT is normal.  Neck is without JVD, adenopathy, thyromegaly, or bruits. Lungs are clear. Cardiac exam reveals a regular rate and rhythm without gallop, murmur, or click. Normal S1-2. No gallop or murmur. Abdomen is soft and nontender without masses or bruits. His femoral and pedal pulses are 2+. He has no edema. He is alert and oriented x3 and cranial nerves II through XII are intact.  LABORATORY DATA:   Lab Results  Component Value Date   WBC 4.9 11/17/2016   HGB 14.2 11/17/2016   HCT 43.7 11/17/2016   PLT 234 11/17/2016   GLUCOSE 93 11/17/2016   CHOL 137 11/17/2016   TRIG 81 11/17/2016   HDL 46 11/17/2016   LDLCALC 75 11/17/2016   ALT 15 11/17/2016   AST 19 11/17/2016   NA 140 11/17/2016   K 4.7 11/17/2016   CL 106 11/17/2016   CREATININE 1.24 (H) 11/17/2016   BUN 22 11/17/2016   CO2 25 11/17/2016   TSH 0.293 (L) 05/14/2011   INR 2.9 11/05/2016   HGBA1C 5.6 05/14/2011    Assessment / Plan: 1. History of CVA. TEE documented a left atrial appendage thrombus despite prior ligation of the atrial appendage. He is on chronic anticoagulation. No recurrent CVA sx.  2.  Coronary disease status post CABG in 2005. He also had a Maze procedure at that time.  Cardiac cath in August 2016 showed all grafts were patent except SVG to the RCA that was occluded. He underwent successful stenting of the proximal RCA with DES. He is asymptomatic.   3. Atrial fibrillation-status post Maze procedure. Will continue metoprolol and coumadin. NSR on last Ecg.   4. Hypercholesterolemia. On chronic Crestor. Excellent control.  5. HTN. Blood pressure is well controlled. Continue Rx.  Follow up in 6 months

## 2016-11-26 ENCOUNTER — Other Ambulatory Visit: Payer: Medicare Other

## 2016-11-26 ENCOUNTER — Ambulatory Visit (INDEPENDENT_AMBULATORY_CARE_PROVIDER_SITE_OTHER): Payer: Medicare Other | Admitting: Cardiology

## 2016-11-26 ENCOUNTER — Encounter: Payer: Self-pay | Admitting: Cardiology

## 2016-11-26 VITALS — BP 143/72 | HR 65 | Ht 67.0 in | Wt 171.8 lb

## 2016-11-26 DIAGNOSIS — I25810 Atherosclerosis of coronary artery bypass graft(s) without angina pectoris: Secondary | ICD-10-CM

## 2016-11-26 DIAGNOSIS — I341 Nonrheumatic mitral (valve) prolapse: Secondary | ICD-10-CM

## 2016-11-26 DIAGNOSIS — I480 Paroxysmal atrial fibrillation: Secondary | ICD-10-CM

## 2016-11-26 DIAGNOSIS — Z7901 Long term (current) use of anticoagulants: Secondary | ICD-10-CM

## 2016-11-26 NOTE — Patient Instructions (Signed)
Continue your current therapy  I will see you in 6 months.   

## 2016-12-10 ENCOUNTER — Other Ambulatory Visit: Payer: Self-pay | Admitting: Physician Assistant

## 2016-12-17 ENCOUNTER — Ambulatory Visit (INDEPENDENT_AMBULATORY_CARE_PROVIDER_SITE_OTHER): Payer: Medicare Other | Admitting: Pharmacist

## 2016-12-17 DIAGNOSIS — Z952 Presence of prosthetic heart valve: Secondary | ICD-10-CM

## 2016-12-17 DIAGNOSIS — Z5181 Encounter for therapeutic drug level monitoring: Secondary | ICD-10-CM

## 2016-12-17 DIAGNOSIS — Z7901 Long term (current) use of anticoagulants: Secondary | ICD-10-CM | POA: Diagnosis not present

## 2016-12-17 DIAGNOSIS — I341 Nonrheumatic mitral (valve) prolapse: Secondary | ICD-10-CM | POA: Diagnosis not present

## 2016-12-17 LAB — POCT INR: INR: 2.5

## 2016-12-28 ENCOUNTER — Other Ambulatory Visit: Payer: Self-pay | Admitting: Cardiology

## 2016-12-28 NOTE — Telephone Encounter (Signed)
Rx has been sent to the pharmacy electronically. ° °

## 2017-01-05 ENCOUNTER — Other Ambulatory Visit: Payer: Self-pay | Admitting: Cardiology

## 2017-01-05 NOTE — Telephone Encounter (Signed)
REFILL 

## 2017-01-06 ENCOUNTER — Other Ambulatory Visit: Payer: Self-pay | Admitting: Cardiology

## 2017-02-03 ENCOUNTER — Ambulatory Visit (INDEPENDENT_AMBULATORY_CARE_PROVIDER_SITE_OTHER): Payer: Medicare Other | Admitting: Pharmacist

## 2017-02-03 DIAGNOSIS — Z952 Presence of prosthetic heart valve: Secondary | ICD-10-CM | POA: Diagnosis not present

## 2017-02-03 DIAGNOSIS — I341 Nonrheumatic mitral (valve) prolapse: Secondary | ICD-10-CM

## 2017-02-03 DIAGNOSIS — Z7901 Long term (current) use of anticoagulants: Secondary | ICD-10-CM

## 2017-02-03 DIAGNOSIS — Z5181 Encounter for therapeutic drug level monitoring: Secondary | ICD-10-CM | POA: Diagnosis not present

## 2017-02-03 LAB — POCT INR: INR: 2.4

## 2017-03-04 ENCOUNTER — Telehealth: Payer: Self-pay | Admitting: Cardiology

## 2017-03-04 NOTE — Telephone Encounter (Signed)
New message    Pt called to schedule his December appt with Dr. Martinique. Will not accept appointment with PA. Requests to be squeezed into Dr. Martinique schedule.

## 2017-03-04 NOTE — Telephone Encounter (Signed)
This is a scheduling issue. I currently do not see December appt openings, however, December schedule may not be available. Pt is welcome to schedule in November if he wishes - Dr. Martinique currently has several available days that month.

## 2017-03-09 ENCOUNTER — Ambulatory Visit (INDEPENDENT_AMBULATORY_CARE_PROVIDER_SITE_OTHER): Payer: Medicare Other | Admitting: Pharmacist

## 2017-03-09 DIAGNOSIS — I341 Nonrheumatic mitral (valve) prolapse: Secondary | ICD-10-CM

## 2017-03-09 DIAGNOSIS — Z7901 Long term (current) use of anticoagulants: Secondary | ICD-10-CM | POA: Diagnosis not present

## 2017-03-09 DIAGNOSIS — Z952 Presence of prosthetic heart valve: Secondary | ICD-10-CM | POA: Diagnosis not present

## 2017-03-09 DIAGNOSIS — Z5181 Encounter for therapeutic drug level monitoring: Secondary | ICD-10-CM

## 2017-03-09 LAB — POCT INR: INR: 3

## 2017-04-20 ENCOUNTER — Other Ambulatory Visit: Payer: Self-pay | Admitting: Cardiology

## 2017-04-20 ENCOUNTER — Ambulatory Visit (INDEPENDENT_AMBULATORY_CARE_PROVIDER_SITE_OTHER): Payer: Medicare Other | Admitting: Pharmacist

## 2017-04-20 DIAGNOSIS — Z952 Presence of prosthetic heart valve: Secondary | ICD-10-CM

## 2017-04-20 DIAGNOSIS — I341 Nonrheumatic mitral (valve) prolapse: Secondary | ICD-10-CM

## 2017-04-20 DIAGNOSIS — Z7901 Long term (current) use of anticoagulants: Secondary | ICD-10-CM

## 2017-04-20 DIAGNOSIS — Z5181 Encounter for therapeutic drug level monitoring: Secondary | ICD-10-CM

## 2017-04-20 LAB — POCT INR: INR: 2.6

## 2017-05-10 NOTE — Progress Notes (Signed)
Arthur [name] Date of Birth: September 04, 1928   History of Present Illness: Arthur [name] is seen for followup of CAD. He has a history of coronary disease and is status post CABG in 2005. He had mitral valve repair and a Maze procedure at that time. He subsequently developed recurrent CVA and TEE demonstrated persistent left atrial appendage thrombus despite a history of previous ligation of the appendage. He has been on chronic anticoagulation.  In April 2016 he presented with a near syncopal episode. He was found to be in atrial fibrillation. Managed with rate control and anticoagulation. Converted back to NSR.    He underwent nuclear stress test in 01/03/2015 revealed a normal ejection fraction however there was a moderate sized, severe intensity, reversible inferior defect consistent with moderate inferior ischemia. Cardiac cath 01/24/15 which showed severe 3v obstructive CAD with patent LIMA-LAD, SVG-diag, SVG-OM3 and occluded SVG-PDA. He had successful stenting of prox RCA with a DES.   On follow up today he is doing very well. He denies any chest pain, SOB, palpitations. Stays active. Still helping care for his 32  Yo sister in [place]. [place] who broke her hip.    Current Outpatient Medications on File Prior to Visit  Medication Sig Dispense Refill  . amLODipine (NORVASC) 5 MG tablet Take 1 tablet (5 mg total) by mouth daily. 90 tablet 2  . benazepril (LOTENSIN) 40 MG tablet TAKE ONE TABLET BY MOUTH DAILY 90 tablet 1  . carvedilol (COREG) 25 MG tablet TAKE ONE TABLET BY MOUTH TWICE A DAY 60 tablet 5  . NITROSTAT 0.4 MG SL tablet PLACE 1 TABLET (0.4 MG TOTAL) UNDER THE TONGUE EVERY 5 (FIVE) MINUTESAS NEEDED FOR CHEST PAIN (UP TO 3 DOSES). 25 tablet 2  . ranitidine (ZANTAC) 150 MG tablet TAKE ONE TABLET BY MOUTH TWICE A DAY 180 tablet 2  . rosuvastatin (CRESTOR) 20 MG tablet TAKE 1 TABLET BY MOUTH DAILY 90 tablet 2  . warfarin (COUMADIN) 2.5 MG tablet TAKE 1/2 TO 1 TABLET BY MOUTH DAILY AS DIRECTED BY  COUMADIN CLINIC 30 tablet 2   No current facility-administered medications on file prior to visit.     Allergies  Allergen Reactions  . Lasix [Furosemide] Rash  . Penicillins Rash  . Sulfa Drugs Cross Reactors Rash    Past Medical History:  Diagnosis Date  . Arthritis    "some in my joints" (01/24/2015)  . CKD (chronic kidney disease), stage II   . Coronary artery disease    a. s/p CABG in 2005 with MV repair and MAZE procedure. b. s/p DES to prox RCA 01/2015.   Marland Kitchen CVA (cerebrovascular accident) [facility]) ~ 2014   RIGHT BRAIN; denies residual on 01/24/2015  . Esophagitis    Distal esophagitis  . GERD (gastroesophageal reflux disease)   . Heart murmur   . History of hiatal hernia   . History of recurrent TIAs   . Hyperlipidemia   . Hypertension   . Hypertensive vascular disease   . MVP (mitral valve prolapse)   . Nephrolithiasis   . Odynophagia   . PAF (paroxysmal atrial fibrillation) ([place])   . Transudative pleural effusion     Past Surgical History:  Procedure Laterality Date  . APPENDECTOMY  1960's  . CARDIAC CATHETERIZATION  03/19/2004  . CARDIAC CATHETERIZATION N/A 01/24/2015   Procedure: Left Heart Cath and Coronary Angiography;  Surgeon: Arthur Friedlander M Martinique, MD;  Location: [place] [facility];  Service: Cardiovascular;  Laterality: N/A;  . CARDIAC CATHETERIZATION N/A  01/24/2015   Procedure: Coronary Stent Intervention;  Surgeon: Arthur Auvil M Martinique, MD;  Location: [place] [facility];  Service: Cardiovascular;  Laterality: N/A;  . CORONARY ANGIOPLASTY WITH STENT PLACEMENT  01/24/2015   "1 stent"  . CORONARY ARTERY BYPASS GRAFT  04/2004   LIMA GRAFT TO THE DISTAL LAD, SAPHENOUS VEIN GRAFT TO THE FIRST DIADGONAL BRANCH, SAPHENOUS VEIN GRAFT TO THE THIRD MARIGINAL BRANCH, AND SAPHENOUS VEIN GRAFT TO THE PDA  . MAZE  04/2004  . MITRAL VALVE REPAIR  04/2004  . TEE WITHOUT CARDIOVERSION  05/19/2011   Procedure: TRANSESOPHAGEAL ECHOCARDIOGRAM (TEE);  Surgeon: Arthur Palma Martinique, MD;  Location: [place];  Service: Cardiovascular;  Laterality: N/A;  . TONSILLECTOMY AND ADENOIDECTOMY  1944    Social History   Tobacco Use  Smoking Status Former Smoker  . Years: 3.00  . Types: Cigarettes  . Last attempt to quit: 06/15/1955  . Years since quitting: 61.9  Smokeless Tobacco Never Used    Social History   Substance and Sexual Activity  Alcohol Use No    Family History  Problem Relation Age of Onset  . Heart attack Mother   . Stroke Father   . Hypertension Father   . Kidney failure Father     Review of Systems: As noted in history of present illness.  All other systems were reviewed and are negative.  Physical Exam: BP 120/68   Pulse (!) 58   Ht 5\' 7"  (1.702 m)   Wt 174 lb 3.2 oz (79 kg)   SpO2 97%   BMI 27.28 kg/m  GENERAL:  Well appearing WM in NAD HEENT:  PERRL, EOMI, sclera are clear. Oropharynx is clear. NECK:  No jugular venous distention, carotid upstroke brisk and symmetric, no bruits, no thyromegaly or adenopathy LUNGS:  Clear to auscultation bilaterally CHEST:  Unremarkable HEART:  RRR,  PMI not displaced or sustained,S1 and S2 within normal limits, no S3, no S4: no clicks, no rubs, no murmurs ABD:  Soft, nontender. BS +, no masses or bruits. No hepatomegaly, no splenomegaly EXT:  2 + pulses throughout, no edema, no cyanosis no clubbing SKIN:  Warm and dry.  No rashes NEURO:  Alert and oriented x 3. Cranial nerves II through XII intact. PSYCH:  Cognitively intact    LABORATORY DATA:   Lab Results  Component Value Date   WBC 4.9 11/17/2016   HGB 14.2 11/17/2016   HCT 43.7 11/17/2016   PLT 234 11/17/2016   GLUCOSE 93 11/17/2016   CHOL 137 11/17/2016   TRIG 81 11/17/2016   HDL 46 11/17/2016   LDLCALC 75 11/17/2016   ALT 15 11/17/2016   AST 19 11/17/2016   NA 140 11/17/2016   K 4.7 11/17/2016   CL 106 11/17/2016   CREATININE 1.24 (H) 11/17/2016   BUN 22 11/17/2016   CO2 25 11/17/2016   TSH 0.293 (L) 05/14/2011   INR 2.6 04/20/2017    HGBA1C 5.6 05/14/2011   Ecg today shows NSR rate 57 with first degree AV block. Low voltage. Old septal infarct. I have personally reviewed and interpreted this study.  Assessment / Plan: 1. History of CVA. TEE documented a left atrial appendage thrombus despite prior ligation of the atrial appendage. He is on chronic anticoagulation. No recurrent CVA sx. Check INR today. Has done very well on coumadin.  2. Coronary disease status post CABG in 2005. He also had a Maze procedure at that time.  Cardiac cath in August 2016 showed all grafts were patent  except SVG to the RCA that was occluded. He underwent successful stenting of the proximal RCA with DES. He remains  asymptomatic.   3. Atrial fibrillation-status post Maze procedure. Will continue metoprolol and coumadin. Maintaining NSR.  4. Hypercholesterolemia. On chronic Crestor. Excellent control.  5. HTN. Blood pressure is well controlled. Continue Rx.  Follow up in 6 months with lab work

## 2017-05-13 ENCOUNTER — Encounter: Payer: Self-pay | Admitting: Cardiology

## 2017-05-13 ENCOUNTER — Ambulatory Visit (INDEPENDENT_AMBULATORY_CARE_PROVIDER_SITE_OTHER): Payer: Medicare Other | Admitting: Pharmacist Clinician (PhC)/ Clinical Pharmacy Specialist

## 2017-05-13 ENCOUNTER — Ambulatory Visit (INDEPENDENT_AMBULATORY_CARE_PROVIDER_SITE_OTHER): Payer: Medicare Other | Admitting: Cardiology

## 2017-05-13 VITALS — BP 120/68 | HR 58 | Ht 67.0 in | Wt 174.2 lb

## 2017-05-13 DIAGNOSIS — I10 Essential (primary) hypertension: Secondary | ICD-10-CM

## 2017-05-13 DIAGNOSIS — I25810 Atherosclerosis of coronary artery bypass graft(s) without angina pectoris: Secondary | ICD-10-CM

## 2017-05-13 DIAGNOSIS — E7800 Pure hypercholesterolemia, unspecified: Secondary | ICD-10-CM | POA: Diagnosis not present

## 2017-05-13 DIAGNOSIS — I341 Nonrheumatic mitral (valve) prolapse: Secondary | ICD-10-CM | POA: Diagnosis not present

## 2017-05-13 DIAGNOSIS — I480 Paroxysmal atrial fibrillation: Secondary | ICD-10-CM

## 2017-05-13 DIAGNOSIS — Z5181 Encounter for therapeutic drug level monitoring: Secondary | ICD-10-CM

## 2017-05-13 LAB — POCT INR: INR: 2.5

## 2017-05-13 NOTE — Patient Instructions (Signed)
Continue your current therapy  I will see you in 6 months.   

## 2017-05-23 ENCOUNTER — Other Ambulatory Visit: Payer: Self-pay | Admitting: Cardiology

## 2017-06-21 ENCOUNTER — Other Ambulatory Visit: Payer: Self-pay | Admitting: Cardiology

## 2017-06-23 ENCOUNTER — Other Ambulatory Visit: Payer: Self-pay | Admitting: Cardiology

## 2017-06-23 ENCOUNTER — Ambulatory Visit (INDEPENDENT_AMBULATORY_CARE_PROVIDER_SITE_OTHER): Payer: Medicare Other | Admitting: Pharmacist Clinician (PhC)/ Clinical Pharmacy Specialist

## 2017-06-23 DIAGNOSIS — Z5181 Encounter for therapeutic drug level monitoring: Secondary | ICD-10-CM

## 2017-06-23 DIAGNOSIS — I341 Nonrheumatic mitral (valve) prolapse: Secondary | ICD-10-CM

## 2017-06-23 DIAGNOSIS — I480 Paroxysmal atrial fibrillation: Secondary | ICD-10-CM | POA: Diagnosis not present

## 2017-06-23 LAB — POCT INR: INR: 1.8

## 2017-07-11 ENCOUNTER — Encounter (HOSPITAL_COMMUNITY)
Admission: EM | Disposition: A | Discharge status: Rehabilitation Facility | Payer: Self-pay | Source: Home / Self Care | Attending: Neurology

## 2017-07-11 ENCOUNTER — Inpatient Hospital Stay (HOSPITAL_COMMUNITY): Payer: Medicare Other

## 2017-07-11 ENCOUNTER — Inpatient Hospital Stay (HOSPITAL_COMMUNITY)
Admission: EM | Admit: 2017-07-11 | Discharge: 2017-07-15 | DRG: 061 | Disposition: A | Discharge status: Rehabilitation Facility | Payer: Medicare Other | Attending: Neurology | Admitting: Neurology

## 2017-07-11 ENCOUNTER — Emergency Department (HOSPITAL_COMMUNITY): Payer: Medicare Other

## 2017-07-11 ENCOUNTER — Encounter (HOSPITAL_COMMUNITY): Payer: Self-pay | Admitting: Radiology

## 2017-07-11 ENCOUNTER — Emergency Department (HOSPITAL_COMMUNITY): Payer: Medicare Other | Admitting: Anesthesiology

## 2017-07-11 ENCOUNTER — Other Ambulatory Visit: Payer: Self-pay

## 2017-07-11 DIAGNOSIS — I6523 Occlusion and stenosis of bilateral carotid arteries: Secondary | ICD-10-CM | POA: Diagnosis present

## 2017-07-11 DIAGNOSIS — R471 Dysarthria and anarthria: Secondary | ICD-10-CM | POA: Diagnosis present

## 2017-07-11 DIAGNOSIS — Z87891 Personal history of nicotine dependence: Secondary | ICD-10-CM

## 2017-07-11 DIAGNOSIS — I63421 Cerebral infarction due to embolism of right anterior cerebral artery: Secondary | ICD-10-CM | POA: Diagnosis present

## 2017-07-11 DIAGNOSIS — I63411 Cerebral infarction due to embolism of right middle cerebral artery: Principal | ICD-10-CM

## 2017-07-11 DIAGNOSIS — H518 Other specified disorders of binocular movement: Secondary | ICD-10-CM | POA: Diagnosis present

## 2017-07-11 DIAGNOSIS — D72829 Elevated white blood cell count, unspecified: Secondary | ICD-10-CM | POA: Diagnosis not present

## 2017-07-11 DIAGNOSIS — I480 Paroxysmal atrial fibrillation: Secondary | ICD-10-CM | POA: Diagnosis present

## 2017-07-11 DIAGNOSIS — Z8673 Personal history of transient ischemic attack (TIA), and cerebral infarction without residual deficits: Secondary | ICD-10-CM | POA: Diagnosis present

## 2017-07-11 DIAGNOSIS — R29810 Facial weakness: Secondary | ICD-10-CM | POA: Diagnosis present

## 2017-07-11 DIAGNOSIS — N182 Chronic kidney disease, stage 2 (mild): Secondary | ICD-10-CM | POA: Diagnosis present

## 2017-07-11 DIAGNOSIS — J9811 Atelectasis: Secondary | ICD-10-CM | POA: Diagnosis not present

## 2017-07-11 DIAGNOSIS — I639 Cerebral infarction, unspecified: Secondary | ICD-10-CM | POA: Diagnosis present

## 2017-07-11 DIAGNOSIS — G8114 Spastic hemiplegia affecting left nondominant side: Secondary | ICD-10-CM | POA: Diagnosis not present

## 2017-07-11 DIAGNOSIS — R791 Abnormal coagulation profile: Secondary | ICD-10-CM | POA: Diagnosis present

## 2017-07-11 DIAGNOSIS — Z880 Allergy status to penicillin: Secondary | ICD-10-CM

## 2017-07-11 DIAGNOSIS — Z955 Presence of coronary angioplasty implant and graft: Secondary | ICD-10-CM | POA: Diagnosis not present

## 2017-07-11 DIAGNOSIS — I63521 Cerebral infarction due to unspecified occlusion or stenosis of right anterior cerebral artery: Secondary | ICD-10-CM | POA: Diagnosis not present

## 2017-07-11 DIAGNOSIS — R1310 Dysphagia, unspecified: Secondary | ICD-10-CM | POA: Diagnosis present

## 2017-07-11 DIAGNOSIS — R509 Fever, unspecified: Secondary | ICD-10-CM

## 2017-07-11 DIAGNOSIS — D62 Acute posthemorrhagic anemia: Secondary | ICD-10-CM | POA: Diagnosis not present

## 2017-07-11 DIAGNOSIS — E876 Hypokalemia: Secondary | ICD-10-CM | POA: Diagnosis not present

## 2017-07-11 DIAGNOSIS — G8194 Hemiplegia, unspecified affecting left nondominant side: Secondary | ICD-10-CM | POA: Diagnosis present

## 2017-07-11 DIAGNOSIS — Z8249 Family history of ischemic heart disease and other diseases of the circulatory system: Secondary | ICD-10-CM

## 2017-07-11 DIAGNOSIS — I340 Nonrheumatic mitral (valve) insufficiency: Secondary | ICD-10-CM | POA: Diagnosis not present

## 2017-07-11 DIAGNOSIS — M7989 Other specified soft tissue disorders: Secondary | ICD-10-CM | POA: Diagnosis not present

## 2017-07-11 DIAGNOSIS — Z823 Family history of stroke: Secondary | ICD-10-CM

## 2017-07-11 DIAGNOSIS — I10 Essential (primary) hypertension: Secondary | ICD-10-CM | POA: Diagnosis not present

## 2017-07-11 DIAGNOSIS — H018 Other specified inflammations of eyelid: Secondary | ICD-10-CM | POA: Diagnosis not present

## 2017-07-11 DIAGNOSIS — I69354 Hemiplegia and hemiparesis following cerebral infarction affecting left non-dominant side: Secondary | ICD-10-CM | POA: Diagnosis not present

## 2017-07-11 DIAGNOSIS — N183 Chronic kidney disease, stage 3 (moderate): Secondary | ICD-10-CM | POA: Diagnosis not present

## 2017-07-11 DIAGNOSIS — I2510 Atherosclerotic heart disease of native coronary artery without angina pectoris: Secondary | ICD-10-CM | POA: Diagnosis present

## 2017-07-11 DIAGNOSIS — E870 Hyperosmolality and hypernatremia: Secondary | ICD-10-CM | POA: Diagnosis not present

## 2017-07-11 DIAGNOSIS — Z7901 Long term (current) use of anticoagulants: Secondary | ICD-10-CM | POA: Diagnosis not present

## 2017-07-11 DIAGNOSIS — J9690 Respiratory failure, unspecified, unspecified whether with hypoxia or hypercapnia: Secondary | ICD-10-CM

## 2017-07-11 DIAGNOSIS — H10022 Other mucopurulent conjunctivitis, left eye: Secondary | ICD-10-CM | POA: Diagnosis not present

## 2017-07-11 DIAGNOSIS — J9600 Acute respiratory failure, unspecified whether with hypoxia or hypercapnia: Secondary | ICD-10-CM | POA: Diagnosis not present

## 2017-07-11 DIAGNOSIS — I700 Atherosclerosis of aorta: Secondary | ICD-10-CM | POA: Diagnosis present

## 2017-07-11 DIAGNOSIS — E785 Hyperlipidemia, unspecified: Secondary | ICD-10-CM | POA: Diagnosis present

## 2017-07-11 DIAGNOSIS — I6930 Unspecified sequelae of cerebral infarction: Secondary | ICD-10-CM | POA: Diagnosis present

## 2017-07-11 DIAGNOSIS — J180 Bronchopneumonia, unspecified organism: Secondary | ICD-10-CM | POA: Diagnosis not present

## 2017-07-11 DIAGNOSIS — I672 Cerebral atherosclerosis: Secondary | ICD-10-CM | POA: Diagnosis present

## 2017-07-11 DIAGNOSIS — R29719 NIHSS score 19: Secondary | ICD-10-CM | POA: Diagnosis present

## 2017-07-11 DIAGNOSIS — K219 Gastro-esophageal reflux disease without esophagitis: Secondary | ICD-10-CM | POA: Diagnosis present

## 2017-07-11 DIAGNOSIS — Z888 Allergy status to other drugs, medicaments and biological substances status: Secondary | ICD-10-CM

## 2017-07-11 DIAGNOSIS — Z87442 Personal history of urinary calculi: Secondary | ICD-10-CM

## 2017-07-11 DIAGNOSIS — I129 Hypertensive chronic kidney disease with stage 1 through stage 4 chronic kidney disease, or unspecified chronic kidney disease: Secondary | ICD-10-CM | POA: Diagnosis present

## 2017-07-11 DIAGNOSIS — Z841 Family history of disorders of kidney and ureter: Secondary | ICD-10-CM

## 2017-07-11 DIAGNOSIS — I69322 Dysarthria following cerebral infarction: Secondary | ICD-10-CM | POA: Diagnosis not present

## 2017-07-11 DIAGNOSIS — H5789 Other specified disorders of eye and adnexa: Secondary | ICD-10-CM | POA: Diagnosis not present

## 2017-07-11 DIAGNOSIS — I69391 Dysphagia following cerebral infarction: Secondary | ICD-10-CM | POA: Diagnosis not present

## 2017-07-11 DIAGNOSIS — Z951 Presence of aortocoronary bypass graft: Secondary | ICD-10-CM

## 2017-07-11 DIAGNOSIS — I272 Pulmonary hypertension, unspecified: Secondary | ICD-10-CM | POA: Diagnosis present

## 2017-07-11 HISTORY — PX: IR ANGIO VERTEBRAL SEL SUBCLAVIAN INNOMINATE UNI R MOD SED: IMG5365

## 2017-07-11 HISTORY — PX: IR PERCUTANEOUS ART THROMBECTOMY/INFUSION INTRACRANIAL INC DIAG ANGIO: IMG6087

## 2017-07-11 HISTORY — PX: RADIOLOGY WITH ANESTHESIA: SHX6223

## 2017-07-11 LAB — BLOOD GAS, ARTERIAL
Acid-base deficit: 3.9 mmol/L — ABNORMAL HIGH (ref 0.0–2.0)
Bicarbonate: 20.4 mmol/L (ref 20.0–28.0)
DRAWN BY: 270271
FIO2: 100
MECHVT: 550 mL
O2 Saturation: 99.5 %
PEEP/CPAP: 5 cmH2O
PO2 ART: 318 mmHg — AB (ref 83.0–108.0)
Patient temperature: 98.6
RATE: 18 resp/min
pCO2 arterial: 35.7 mmHg (ref 32.0–48.0)
pH, Arterial: 7.374 (ref 7.350–7.450)

## 2017-07-11 LAB — I-STAT CHEM 8, ED
BUN: 21 mg/dL — AB (ref 6–20)
CHLORIDE: 105 mmol/L (ref 101–111)
CREATININE: 1.2 mg/dL (ref 0.61–1.24)
Calcium, Ion: 1.15 mmol/L (ref 1.15–1.40)
GLUCOSE: 107 mg/dL — AB (ref 65–99)
HEMATOCRIT: 44 % (ref 39.0–52.0)
HEMOGLOBIN: 15 g/dL (ref 13.0–17.0)
Potassium: 4.6 mmol/L (ref 3.5–5.1)
Sodium: 139 mmol/L (ref 135–145)
TCO2: 23 mmol/L (ref 22–32)

## 2017-07-11 LAB — COMPREHENSIVE METABOLIC PANEL
ALBUMIN: 4.3 g/dL (ref 3.5–5.0)
ALT: 14 U/L — AB (ref 17–63)
AST: 25 U/L (ref 15–41)
Alkaline Phosphatase: 57 U/L (ref 38–126)
Anion gap: 10 (ref 5–15)
BUN: 21 mg/dL — AB (ref 6–20)
CO2: 21 mmol/L — ABNORMAL LOW (ref 22–32)
CREATININE: 1.29 mg/dL — AB (ref 0.61–1.24)
Calcium: 9.2 mg/dL (ref 8.9–10.3)
Chloride: 107 mmol/L (ref 101–111)
GFR calc Af Amer: 56 mL/min — ABNORMAL LOW (ref >60)
GFR calc non Af Amer: 48 mL/min — ABNORMAL LOW (ref >60)
GLUCOSE: 111 mg/dL — AB (ref 65–99)
Potassium: 4.6 mmol/L (ref 3.5–5.1)
Sodium: 138 mmol/L (ref 135–145)
Total Bilirubin: 0.9 mg/dL (ref 0.3–1.2)
Total Protein: 6.6 g/dL (ref 6.5–8.1)

## 2017-07-11 LAB — CBC
HEMATOCRIT: 44.1 % (ref 39.0–52.0)
HEMOGLOBIN: 14.4 g/dL (ref 13.0–17.0)
MCH: 29.7 pg (ref 26.0–34.0)
MCHC: 32.7 g/dL (ref 30.0–36.0)
MCV: 90.9 fL (ref 78.0–100.0)
Platelets: 219 10*3/uL (ref 150–400)
RBC: 4.85 MIL/uL (ref 4.22–5.81)
RDW: 13.6 % (ref 11.5–15.5)
WBC: 7.2 10*3/uL (ref 4.0–10.5)

## 2017-07-11 LAB — DIFFERENTIAL
BASOS ABS: 0 10*3/uL (ref 0.0–0.1)
BASOS PCT: 0 %
EOS ABS: 0.5 10*3/uL (ref 0.0–0.7)
Eosinophils Relative: 6 %
Lymphocytes Relative: 19 %
Lymphs Abs: 1.4 10*3/uL (ref 0.7–4.0)
MONOS PCT: 9 %
Monocytes Absolute: 0.7 10*3/uL (ref 0.1–1.0)
NEUTROS ABS: 4.7 10*3/uL (ref 1.7–7.7)
NEUTROS PCT: 66 %

## 2017-07-11 LAB — I-STAT TROPONIN, ED: Troponin i, poc: 0 ng/mL (ref 0.00–0.08)

## 2017-07-11 LAB — APTT: APTT: 31 s (ref 24–36)

## 2017-07-11 LAB — ETHANOL: Alcohol, Ethyl (B): <10 mg/dL (ref <10)

## 2017-07-11 LAB — PROTIME-INR
INR: 1.48
Prothrombin Time: 17.8 seconds — ABNORMAL HIGH (ref 11.4–15.2)

## 2017-07-11 LAB — CBG MONITORING, ED: Glucose-Capillary: 90 mg/dL (ref 65–99)

## 2017-07-11 SURGERY — RADIOLOGY WITH ANESTHESIA
Anesthesia: General

## 2017-07-11 MED ORDER — PROPOFOL 1000 MG/100ML IV EMUL
0.0000 ug/kg/min | INTRAVENOUS | Status: DC
  Administered 2017-07-11 – 2017-07-12 (×2): 40 ug/kg/min via INTRAVENOUS
  Filled 2017-07-11: qty 100

## 2017-07-11 MED ORDER — LIDOCAINE HCL (CARDIAC) 20 MG/ML IV SOLN
INTRAVENOUS | Status: DC | PRN
  Administered 2017-07-11: 100 mg via INTRAVENOUS

## 2017-07-11 MED ORDER — STROKE: EARLY STAGES OF RECOVERY BOOK
Freq: Once | Status: DC
  Filled 2017-07-11: qty 1

## 2017-07-11 MED ORDER — VANCOMYCIN HCL IN DEXTROSE 1-5 GM/200ML-% IV SOLN
INTRAVENOUS | Status: AC
Start: 2017-07-11 — End: 2017-07-12
  Filled 2017-07-11: qty 200

## 2017-07-11 MED ORDER — VANCOMYCIN HCL 1000 MG IV SOLR
INTRAVENOUS | Status: DC | PRN
  Administered 2017-07-11: 1000 mg via INTRAVENOUS

## 2017-07-11 MED ORDER — METOPROLOL TARTRATE 5 MG/5ML IV SOLN
5.0000 mg | INTRAVENOUS | Status: DC | PRN
Start: 2017-07-11 — End: 2017-07-15

## 2017-07-11 MED ORDER — ACETAMINOPHEN 650 MG RE SUPP: 650.0000 mg | RECTAL | Status: DC | PRN

## 2017-07-11 MED ORDER — PROPOFOL 500 MG/50ML IV EMUL
INTRAVENOUS | Status: DC | PRN
  Administered 2017-07-11: 25 ug/kg/min via INTRAVENOUS

## 2017-07-11 MED ORDER — PHENYLEPHRINE HCL 10 MG/ML IJ SOLN
INTRAVENOUS | Status: DC | PRN
  Administered 2017-07-11: 20 ug/min via INTRAVENOUS

## 2017-07-11 MED ORDER — IOPAMIDOL (ISOVUE-370) INJECTION 76%
INTRAVENOUS | Status: AC
  Administered 2017-07-11: 50 mL
  Filled 2017-07-11: qty 50

## 2017-07-11 MED ORDER — NICARDIPINE HCL IN NACL 20-0.86 MG/200ML-% IV SOLN
INTRAVENOUS | Status: AC
  Filled 2017-07-11: qty 200

## 2017-07-11 MED ORDER — ALTEPLASE 30 MG/30 ML FOR INTERV. RAD
1.0000 mg | INTRA_ARTERIAL | Status: AC | PRN
  Administered 2017-07-11: 7 mg via INTRA_ARTERIAL
  Filled 2017-07-11: qty 30

## 2017-07-11 MED ORDER — EPTIFIBATIDE 20 MG/10ML IV SOLN
INTRAVENOUS | Status: AC
  Filled 2017-07-11: qty 10

## 2017-07-11 MED ORDER — SODIUM CHLORIDE 0.9 % IV SOLN
INTRAVENOUS | Status: DC | PRN
  Administered 2017-07-11: 19:00:00 via INTRAVENOUS

## 2017-07-11 MED ORDER — FENTANYL CITRATE (PF) 100 MCG/2ML IJ SOLN: 50.0000 ug | INTRAMUSCULAR | Status: DC | PRN

## 2017-07-11 MED ORDER — ROCURONIUM BROMIDE 100 MG/10ML IV SOLN
INTRAVENOUS | Status: DC | PRN
  Administered 2017-07-11 (×2): 50 mg via INTRAVENOUS

## 2017-07-11 MED ORDER — EPHEDRINE SULFATE 50 MG/ML IJ SOLN
INTRAMUSCULAR | Status: DC | PRN
  Administered 2017-07-11 (×4): 5 mg via INTRAVENOUS

## 2017-07-11 MED ORDER — PANTOPRAZOLE SODIUM 40 MG IV SOLR
40.0000 mg | Freq: Every day | INTRAVENOUS | Status: DC
  Administered 2017-07-11 – 2017-07-12 (×2): 40 mg via INTRAVENOUS
  Filled 2017-07-11 (×2): qty 40

## 2017-07-11 MED ORDER — ACETAMINOPHEN 325 MG PO TABS: 650.0000 mg | ORAL_TABLET | ORAL | Status: DC | PRN

## 2017-07-11 MED ORDER — IOPAMIDOL (ISOVUE-300) INJECTION 61%
INTRAVENOUS | Status: AC
  Administered 2017-07-11: 120 mL via INTRA_ARTERIAL
  Filled 2017-07-11: qty 300

## 2017-07-11 MED ORDER — ROSUVASTATIN CALCIUM 20 MG PO TABS
20.0000 mg | ORAL_TABLET | Freq: Every day | ORAL | Status: DC
  Administered 2017-07-12 – 2017-07-14 (×3): 20 mg
  Filled 2017-07-11 (×3): qty 1

## 2017-07-11 MED ORDER — ACETAMINOPHEN 160 MG/5ML PO SOLN: 650.0000 mg | ORAL | Status: DC | PRN

## 2017-07-11 MED ORDER — NICARDIPINE HCL IN NACL 20-0.86 MG/200ML-% IV SOLN
0.0000 mg/h | INTRAVENOUS | Status: DC
  Administered 2017-07-11: 2 mg/h via INTRAVENOUS

## 2017-07-11 MED ORDER — ALTEPLASE (STROKE) FULL DOSE INFUSION
0.9000 mg/kg | Freq: Once | INTRAVENOUS | Status: AC
  Administered 2017-07-11: 65 mg via INTRAVENOUS
  Filled 2017-07-11: qty 100

## 2017-07-11 MED ORDER — PROPOFOL 10 MG/ML IV BOLUS
INTRAVENOUS | Status: DC | PRN
  Administered 2017-07-11: 30 mg via INTRAVENOUS
  Administered 2017-07-11: 100 mg via INTRAVENOUS

## 2017-07-11 MED ORDER — NITROGLYCERIN 1 MG/10 ML FOR IR/CATH LAB
INTRA_ARTERIAL | Status: AC
  Filled 2017-07-11: qty 10

## 2017-07-11 MED ORDER — SODIUM CHLORIDE 0.9 % IV SOLN
INTRAVENOUS | Status: DC
  Administered 2017-07-11 – 2017-07-13 (×3): via INTRAVENOUS

## 2017-07-11 NOTE — Anesthesia Procedure Notes (Signed)
Procedure Name: Intubation Date/Time: 07/11/2017 7:18 PM Performed by: Babs Bertin, CRNA Pre-anesthesia Checklist: Patient identified, Emergency Drugs available, Suction available and Patient being monitored Patient Re-evaluated:Patient Re-evaluated prior to induction Oxygen Delivery Method: Circle System Utilized Preoxygenation: Pre-oxygenation with 100% oxygen Induction Type: IV induction and Rapid sequence Laryngoscope Size: 3 and Mac Grade View: Grade I Tube type: Subglottic suction tube Tube size: 7.5 mm Number of attempts: 1 Airway Equipment and Method: Stylet and Oral airway Placement Confirmation: ETT inserted through vocal cords under direct vision,  positive ETCO2 and breath sounds checked- equal and bilateral Secured at: 22 cm Tube secured with: Tape Dental Injury: Teeth and Oropharynx as per pre-operative assessment

## 2017-07-11 NOTE — Procedures (Signed)
S/P RT common carotid arteriogram followed by complete revascularization RT ACA with 7mg  of superselective  IA TPA with a  TICI 3 reperfusion.Marland Kitchen TICI 2b reperfusion with IV tpa.

## 2017-07-11 NOTE — H&P (Addendum)
Chief Complaint: Stroke   History obtained from: Patient and Chart     HPI:                                                                                                                                       Arthur [name] is an 84 y.o. male with PMH of CVA, left atrial appendage thrombus, CAD s/p CABG, Afib s/p Maze, HLD, HTN  On Coumadin presents as stroke alert for left side weakness and right gaze deviation.   Per wife, patient drove her to [facility] around 5pm. Patient was waiting in the car and when she returned he had left facial droop, left side weakness and not talking right. She called 911 and EMS brought pt to [facility] ER. BP on arrival was 172/85 and CBG was 92.  Patient was neglecting left side, hemiplegic and had forced right gaze deviation. CT head showed no hemorrhage. Pt received tpA was INR returned as 1.48. CTA revealed m2 RMCA cuttoff and patient taken to IR.    Date last known well: 07/11/17 Time last known well: 5.15 pm tPA Given: yes NIHSS: 19  Baseline MRS 1    Past Medical History:  Diagnosis Date  . Arthritis    "some in my joints" (01/24/2015)  . CKD (chronic kidney disease), stage II   . Coronary artery disease    a. s/p CABG in 2005 with MV repair and MAZE procedure. b. s/p DES to prox RCA 01/2015.   Marland Kitchen CVA (cerebrovascular accident) [facility]) ~ 2014   RIGHT BRAIN; denies residual on 01/24/2015  . Esophagitis    Distal esophagitis  . GERD (gastroesophageal reflux disease)   . Heart murmur   . History of hiatal hernia   . History of recurrent TIAs   . Hyperlipidemia   . Hypertension   . Hypertensive vascular disease   . MVP (mitral valve prolapse)   . Nephrolithiasis   . Odynophagia   . PAF (paroxysmal atrial fibrillation) ([place])   . Transudative pleural effusion     Past Surgical History:  Procedure Laterality Date  . APPENDECTOMY  1960's  . CARDIAC CATHETERIZATION  03/19/2004  . CARDIAC CATHETERIZATION N/A 01/24/2015   Procedure: Left Heart Cath  and Coronary Angiography;  Surgeon: Peter M Martinique, MD;  Location: [place] CV LAB;  Service: Cardiovascular;  Laterality: N/A;  . CARDIAC CATHETERIZATION N/A 01/24/2015   Procedure: Coronary Stent Intervention;  Surgeon: Peter M Martinique, MD;  Location: [place] CV LAB;  Service: Cardiovascular;  Laterality: N/A;  . CORONARY ANGIOPLASTY WITH STENT PLACEMENT  01/24/2015   "1 stent"  . CORONARY ARTERY BYPASS GRAFT  04/2004   LIMA GRAFT TO THE DISTAL LAD, SAPHENOUS VEIN GRAFT TO THE FIRST DIADGONAL BRANCH, SAPHENOUS VEIN GRAFT TO THE THIRD MARIGINAL BRANCH, AND SAPHENOUS VEIN GRAFT TO THE PDA  . MAZE  04/2004  . MITRAL VALVE REPAIR  04/2004  . TEE WITHOUT CARDIOVERSION  05/19/2011   Procedure: TRANSESOPHAGEAL ECHOCARDIOGRAM (TEE);  Surgeon: Peter Martinique, MD;  Location: [facility] ENDOSCOPY;  Service: Cardiovascular;  Laterality: N/A;  . TONSILLECTOMY AND ADENOIDECTOMY  1944    Family History  Problem Relation Age of Onset  . Heart attack Mother   . Stroke Father   . Hypertension Father   . Kidney failure Father    Social History:  reports that he quit smoking about 62 years ago. His smoking use included cigarettes. He quit after 3.00 years of use. he has never used smokeless tobacco. He reports that he does not drink alcohol or use drugs.  Allergies:  Allergies  Allergen Reactions  . Tape Other (See Comments)    PATIENT IS TAKING COUMADIN; HIS SKIN TEARS & BRUISES EASILY; PLEASE USE COBAN WRAP OR AN ALTERNATIVE TO MEDICAL TAPE!!  . Lasix [Furosemide] Rash  . Penicillins Rash    Has patient had a PCN reaction causing immediate rash, facial/tongue/throat swelling, SOB or lightheadedness with hypotension: Yes Has patient had a PCN reaction causing severe rash involving mucus membranes or skin necrosis: Unk Has patient had a PCN reaction that required hospitalization: Unk Has patient had a PCN reaction occurring within the last 10 years:  If all of the above answers are "NO", then may proceed  with Cephalosporin use.   . Sulfa Drugs Cross Reactors Rash    Medications:                                                                                                                        I reviewed home medications   ROS:                                                                                                                                     14 systems reviewed and negative except above    Examination:  General: Appears well-developed and well-nourished.  Psych: Affect appropriate to situation Eyes: No scleral injection HENT: No OP obstrucion Head: Normocephalic.  Cardiovascular: Normal rate and regular rhythm.  Respiratory: Effort normal and breath sounds normal to anterior ascultation GI: Soft.  No distension. There is no tenderness.  Skin: WDI   Neurological Examination Mental Status: Alert,slightly confused.  Speech fluent without evidence of aphasia.Able to follow simple commands  Cranial Nerves: II: Left H H III,IV, VI: forced gaze deviation,  pupils equal, round, reactive to light and accommodation V,VII: left facial droop VIII: hearing normal bilaterally IX,X: uvula rises symmetrically XI: bilateral shoulder shrug XII: midline tongue extension Motor: Right : Upper extremity   5/5    Left:     Upper extremity   0/5  Lower extremity   5/5     Lower extremity   0/5 Tone and bulk:normal tone throughout; no atrophy noted Sensory: Pinprick and light touch intact throughout, bilaterally Deep Tendon Reflexes: 2+ and symmetric throughout Plantars: Right: downgoing   Left: downgoing Cerebellar: normal finger-to-nose, normal rapid alternating movements and normal heel-to-shin test Gait: normal gait and station     Lab Results: Basic Metabolic Panel: Recent Labs  Lab 07/11/17 1820 07/11/17 1826  NA 138 139  K 4.6 4.6  CL 107 105  CO2 21*  --    GLUCOSE 111* 107*  BUN 21* 21*  CREATININE 1.29* 1.20  CALCIUM 9.2  --     CBC: Recent Labs  Lab 07/11/17 1820 07/11/17 1826  WBC 7.2  --   NEUTROABS 4.7  --   HGB 14.4 15.0  HCT 44.1 44.0  MCV 90.9  --   PLT 219  --     Coagulation Studies: Recent Labs    07/11/17 1820  LABPROT 17.8*  INR 1.48    Imaging: Ct Angio Head W Or Wo Contrast  Result Date: 07/11/2017 CLINICAL DATA:  84 y/o  M; code stroke with left-sided deficits. EXAM: CT ANGIOGRAPHY HEAD AND NECK TECHNIQUE: Multidetector CT imaging of the head and neck was performed using the standard protocol during bolus administration of intravenous contrast. Multiplanar CT image reconstructions and MIPs were obtained to evaluate the vascular anatomy. Carotid stenosis measurements (when applicable) are obtained utilizing NASCET criteria, using the distal internal carotid diameter as the denominator. CONTRAST:  74mL ISOVUE-370 IOPAMIDOL (ISOVUE-370) INJECTION 76% COMPARISON:  07/11/2016 CT head.  05/15/2011 MRA head. FINDINGS: CTA NECK FINDINGS Aortic arch: Standard branching. Imaged portion shows no evidence of aneurysm or dissection. No significant stenosis of the major arch vessel origins. Moderate calcific atherosclerosis. Status post LIMA and saphenous CABG grafts. Right carotid system: No evidence of dissection, stenosis (50% or greater) or occlusion. Dense calcified plaque of the carotid bifurcation with mild less than 50% distal common carotid and internal carotid artery stenosis. Left carotid system: No evidence of dissection, stenosis (50% or greater) or occlusion. Dense calcified plaque of carotid bifurcation with mild less than 50% proximal ICA stenosis. Calcified plaque of left mid common carotid artery with minimal less than 30% stenosis. Vertebral arteries: Codominant. No evidence of dissection, stenosis (50% or greater) or occlusion. Right V1 calcified plaque with mild less than 50% stenosis. Skeleton: Severe cervical  spondylosis with extensive discogenic degenerative changes greatest at the C5-C7 levels and severe facet hypertrophy. Reversal of cervical curvature with apex at C5. Multiple levels of canal stenosis greatest at the C5-6 level where it is moderate to severe. Uncovertebral and facet hypertrophy narrows the neural foramen greatest at the bilateral  C5-6 and C6-7 levels. Other neck: Negative. Upper chest: Negative. Review of the MIP images confirms the above findings CTA HEAD FINDINGS Anterior circulation: Right M2 superior division proximal occlusion (series 7, image 90). Poor collateralization in the right M2 superior division distribution. Left distal M1 short segment of moderate stenosis. Bilateral carotid siphon calcific plaque with moderate bilateral paraclinoid stenosis. Posterior circulation: New focal calcification within the right P4 parietooccipital branch origin (series 8, image 116) with normal downstream opacification of the artery, possibly atherosclerotic or nonocclusive calcified plaque thrombus. Bilateral P1 irregularity with mild stenosis. Venous sinuses: As permitted by contrast timing, patent. Anatomic variants: Anterior communicating artery. No posterior communicating artery identified, likely hypoplastic or absent. Delayed phase: No abnormal intracranial enhancement. Review of the MIP images confirms the above findings IMPRESSION: CTA neck: 1. No dissection, aneurysm, or high-grade stenosis by NASCET criteria. 2. Calcified plaque of aorta and carotid bifurcations with mild less than 50% proximal ICA stenosis bilaterally. 3. Severe cervical spondylosis greatest at C5 and C6 levels. At least moderate C5-6 canal stenosis. CTA head: 1. Right M2 superior division proximal occlusion with poor downstream collateralization. 2. New calcification within right P 4 parieto-occipital origin, possibly atherosclerotic or nonocclusive calcified plaque thrombus. 3. Intracranial atherosclerosis with multiple areas  of mild-to-moderate stenosis. No high-grade stenosis, aneurysm, or additional large vessel occlusion. These results were communicated to Dr. Lorraine Lax at [place] 1/28/2019by text page via the [facility] messaging system. Electronically Signed   By: Kristine Garbe M.D.   On: 07/11/2017 19:05   Ct Angio Neck W And/or Wo Contrast  Result Date: 07/11/2017 CLINICAL DATA:  84 y/o  M; code stroke with left-sided deficits. EXAM: CT ANGIOGRAPHY HEAD AND NECK TECHNIQUE: Multidetector CT imaging of the head and neck was performed using the standard protocol during bolus administration of intravenous contrast. Multiplanar CT image reconstructions and MIPs were obtained to evaluate the vascular anatomy. Carotid stenosis measurements (when applicable) are obtained utilizing NASCET criteria, using the distal internal carotid diameter as the denominator. CONTRAST:  36mL ISOVUE-370 IOPAMIDOL (ISOVUE-370) INJECTION 76% COMPARISON:  07/11/2016 CT head.  05/15/2011 MRA head. FINDINGS: CTA NECK FINDINGS Aortic arch: Standard branching. Imaged portion shows no evidence of aneurysm or dissection. No significant stenosis of the major arch vessel origins. Moderate calcific atherosclerosis. Status post LIMA and saphenous CABG grafts. Right carotid system: No evidence of dissection, stenosis (50% or greater) or occlusion. Dense calcified plaque of the carotid bifurcation with mild less than 50% distal common carotid and internal carotid artery stenosis. Left carotid system: No evidence of dissection, stenosis (50% or greater) or occlusion. Dense calcified plaque of carotid bifurcation with mild less than 50% proximal ICA stenosis. Calcified plaque of left mid common carotid artery with minimal less than 30% stenosis. Vertebral arteries: Codominant. No evidence of dissection, stenosis (50% or greater) or occlusion. Right V1 calcified plaque with mild less than 50% stenosis. Skeleton: Severe cervical spondylosis with extensive discogenic  degenerative changes greatest at the C5-C7 levels and severe facet hypertrophy. Reversal of cervical curvature with apex at C5. Multiple levels of canal stenosis greatest at the C5-6 level where it is moderate to severe. Uncovertebral and facet hypertrophy narrows the neural foramen greatest at the bilateral C5-6 and C6-7 levels. Other neck: Negative. Upper chest: Negative. Review of the MIP images confirms the above findings CTA HEAD FINDINGS Anterior circulation: Right M2 superior division proximal occlusion (series 7, image 90). Poor collateralization in the right M2 superior division distribution. Left distal M1 short segment of moderate stenosis.  Bilateral carotid siphon calcific plaque with moderate bilateral paraclinoid stenosis. Posterior circulation: New focal calcification within the right P4 parietooccipital branch origin (series 8, image 116) with normal downstream opacification of the artery, possibly atherosclerotic or nonocclusive calcified plaque thrombus. Bilateral P1 irregularity with mild stenosis. Venous sinuses: As permitted by contrast timing, patent. Anatomic variants: Anterior communicating artery. No posterior communicating artery identified, likely hypoplastic or absent. Delayed phase: No abnormal intracranial enhancement. Review of the MIP images confirms the above findings IMPRESSION: CTA neck: 1. No dissection, aneurysm, or high-grade stenosis by NASCET criteria. 2. Calcified plaque of aorta and carotid bifurcations with mild less than 50% proximal ICA stenosis bilaterally. 3. Severe cervical spondylosis greatest at C5 and C6 levels. At least moderate C5-6 canal stenosis. CTA head: 1. Right M2 superior division proximal occlusion with poor downstream collateralization. 2. New calcification within right P 4 parieto-occipital origin, possibly atherosclerotic or nonocclusive calcified plaque thrombus. 3. Intracranial atherosclerosis with multiple areas of mild-to-moderate stenosis. No  high-grade stenosis, aneurysm, or additional large vessel occlusion. These results were communicated to Dr. Lorraine Lax at [place] 1/28/2019by text page via the [facility] messaging system. Electronically Signed   By: Kristine Garbe M.D.   On: 07/11/2017 19:05   Ct Head Code Stroke Wo Contrast  Result Date: 07/11/2017 CLINICAL DATA:  Code stroke. 84 y/o M; left-sided deficits and right-sided gaze. EXAM: CT HEAD WITHOUT CONTRAST TECHNIQUE: Contiguous axial images were obtained from the base of the skull through the vertex without intravenous contrast. COMPARISON:  05/13/2011 CT head. FINDINGS: Brain: Stable small chronic cortical infarcts in left frontal and right parietal lobes. Stable small chronic lacunar infarct in the right caudate body and right thalamus. Chronic infarction in the left inferior cerebellar hemisphere. Background of mild chronic microvascular ischemic changes and parenchymal volume loss of the brain. Vascular: Calcific atherosclerosis of carotid siphons. Hyperdensity within right distal M1 (series 6, image 21). Skull: Normal. Negative for fracture or focal lesion. Sinuses/Orbits: Left anterior ethmoid and frontal sinus partial opacification in chronic inflammatory changes. Right intra-ocular lens replacement. Other: None. ASPECTS [facility] Stroke Program Early CT Score) - Ganglionic level infarction (caudate, lentiform nuclei, internal capsule, insula, M1-M3 cortex): 7 - Supraganglionic infarction (M4-M6 cortex): 3 Total score (0-10 with 10 being normal): 10 IMPRESSION: 1. No acute intracranial abnormality identified. 2. ASPECTS is 10 3. Dense distal right M1, suspected thrombus. 4. Stable chronic microvascular ischemic changes, parenchymal volume loss, and small cortical infarctions. These results were called by telephone at the time of interpretation on 07/11/2017 at 6:43 pm to Dr. Lorraine Lax, who verbally acknowledged these results. Electronically Signed   By: Kristine Garbe M.D.   On:  07/11/2017 18:43     ASSESSMENT AND PLAN  84 y.o. male with PMH of CVA, left atrial appendage thrombus, CAD s/p CABG, Afib s/p Maze, HLD, HTN  On Coumadin presents as stroke alert for left side weakness and right gaze deviation. Received ivTPA and went for EMT for R M2 superior division thrombus.   Acute Ischemic Stroke   Risk factors:VA, left atrial appendage thrombus, CAD s/p CABG, Afib s/p Maze, HLD, HTN  Etiology: cardioembolic   Recommend # MRI of the brain without contrast #Transthoracic Echo  # no antiplatelets #Start or continue Atorvastatin 40 mg/other high intensity statin # BP goal: to be determined based on recommendation of NIR post procedure # HBAIC and Lipid profile # Telemetry monitoring # Frequent neuro checks # NPO until passes stroke swallow screen  HTN BP Goal to be determined by  NIR  Afib Hold AC for now, s/p TPA Metoprolol PRN for rapid Afib  H/O atrial thrombus Will need to continue Warfarin at time of discharge Echo pending  Ventilator management Consulted CCM  DVT ppx: SCD  Diet: NPO   This patient is neurologically critically ill due to ischemic stroke requiring TPA and mechanical thrombectomy.   He is at risk for significant risk of neurological worsening from cerebral edema,  death from brain herniation, heart failure, hemorrhagic conversion, infection, respiratory failure and seizure. This patient's care requires constant monitoring of vital signs, hemodynamics, respiratory and cardiac monitoring, review of multiple databases, neurological assessment, discussion with family, other specialists and medical decision making of high complexity.  I spent 70  minutes of neurocritical time in the care of this patient.     Please page stroke NP  Or  PA  Or MD from 8am -4 pm  as this patient from this time will be  followed by the stroke.   You can look them up on [email or web address]  Password [facility]        Renji Berwick [facility] Pager Number [phone]

## 2017-07-11 NOTE — Progress Notes (Signed)
Upon arrival, pt pressure in the 160's  No cardene running, upon initiating the cardene gtt, there were no titration parameters in the [facility] or in the order

## 2017-07-11 NOTE — Anesthesia Postprocedure Evaluation (Signed)
Anesthesia Post Note  Patient: FRAZER [place]  Procedure(s) Performed: RADIOLOGY WITH ANESTHESIA (N/A )     Patient location during evaluation: SICU Anesthesia Type: General Level of consciousness: sedated and patient remains intubated per anesthesia plan Pain management: pain level controlled Vital Signs Assessment: post-procedure vital signs reviewed and stable Respiratory status: patient remains intubated per anesthesia plan and patient on ventilator - see flowsheet for VS Cardiovascular status: stable Anesthetic complications: no    Last Vitals:  Vitals:   07/11/17 2150 07/11/17 2200  BP: (!) 167/86 (!) 150/81  Pulse: 63 61  Resp: (!) 24 18  Temp:    SpO2: 100% 100%    Last Pain: There were no vitals filed for this visit.               Nolon Nations

## 2017-07-11 NOTE — Anesthesia Preprocedure Evaluation (Signed)
Anesthesia Evaluation  Patient identified by MRN, date of birth, ID band Patient awake    Reviewed: Allergy & Precautions, NPO status , Patient's Chart, lab work & pertinent test results, reviewed documented beta blocker date and time Preop documentation limited or incomplete due to emergent nature of procedure.  Airway Mallampati: II  TM Distance: >3 FB Neck ROM: Full    Dental no notable dental hx.    Pulmonary former smoker,    Pulmonary exam normal breath sounds clear to auscultation       Cardiovascular hypertension, Pt. on home beta blockers and Pt. on medications + CAD  Normal cardiovascular exam+ Valvular Problems/Murmurs  Rhythm:Regular Rate:Normal     Neuro/Psych CVA negative psych ROS   GI/Hepatic Neg liver ROS, hiatal hernia, GERD  ,  Endo/Other  negative endocrine ROS  Renal/GU Renal disease  negative genitourinary   Musculoskeletal  (+) Arthritis ,   Abdominal   Peds  Hematology negative hematology ROS (+)   Anesthesia Other Findings   Reproductive/Obstetrics negative OB ROS                             Anesthesia Physical Anesthesia Plan  ASA: IV and emergent  Anesthesia Plan: General   Post-op Pain Management:    Induction: Intravenous, Rapid sequence and Cricoid pressure planned  PONV Risk Score and Plan: 2 and Ondansetron, Treatment may vary due to age or medical condition and Dexamethasone  Airway Management Planned: Oral ETT  Additional Equipment: Arterial line  Intra-op Plan:   Post-operative Plan: Possible Post-op intubation/ventilation  Informed Consent: I have reviewed the patients History and Physical, chart, labs and discussed the procedure including the risks, benefits and alternatives for the proposed anesthesia with the patient or authorized representative who has indicated his/her understanding and acceptance.   Dental advisory given  Plan  Discussed with: CRNA  Anesthesia Plan Comments:         Anesthesia Quick Evaluation

## 2017-07-11 NOTE — Anesthesia Procedure Notes (Signed)
Arterial Line Insertion Start/End1/28/2019 7:15 PM, 07/11/2017 7:18 PM Performed by: Nolon Nations, MD, Valetta Fuller, CRNA, CRNA  Preanesthetic checklist: patient identified, IV checked, monitors and equipment checked and pre-op evaluation Left, radial was placed Catheter size: 20 G Hand hygiene performed  and maximum sterile barriers used   Attempts: 1 Procedure performed without using ultrasound guided technique. Ultrasound Notes:anatomy identified Following insertion, dressing applied and Biopatch.

## 2017-07-11 NOTE — Progress Notes (Signed)
Patient ID: Arthur [name], male   DOB: Jan 07, 1929, 84 y.o.   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 Sudden onset of rt gaze deviation and RT sided weakness . CT brain  NO ICH  ASPECTS 9 CTA occluded sup division of RT MCA with occlusion of RT ACA A3 region. Option of endovascular revascularization D/W spouse. Reasons ,risks,alternatives all reviewed with the wife.Risks of ICH of 10 % ,with further  neurological  decline,vent dependency,death and inability to revascularize all reviewed  In detail.  Questions were answered with spouse expressing understanding and wanting to proceed with endovascular treatment under [place]. Informed witnessed consent obtained.  S.Cori Justus MD

## 2017-07-11 NOTE — Progress Notes (Signed)
Patient transported on vent from CT-2 to 6V-70 without complication.

## 2017-07-11 NOTE — Transfer of Care (Signed)
Immediate Anesthesia Transfer of Care Note  Patient: Arthur [name]  Procedure(s) Performed: RADIOLOGY WITH ANESTHESIA (N/A )  Patient Location: ICU  Anesthesia Type:General  Level of Consciousness: Patient remains intubated per anesthesia plan  Airway & Oxygen Therapy: Patient remains intubated per anesthesia plan and Patient placed on Ventilator (see vital sign flow sheet for setting)  Post-op Assessment: Report given to RN and Post -op Vital signs reviewed and stable  Post vital signs: Reviewed and stable  Last Vitals:  Vitals:   07/11/17 1900 07/11/17 1910  BP: (!) 180/94 (!) 156/85  Pulse: 84 82  Resp: 18 18  Temp: 36.8 C   SpO2: 98% 98%    Last Pain: There were no vitals filed for this visit.       Complications: No apparent anesthesia complications

## 2017-07-11 NOTE — ED Notes (Signed)
Pt's CBG result was 90. Informed Anna - RN.

## 2017-07-11 NOTE — Progress Notes (Signed)
Pharmacist Code Stroke Response  Notified to mix tPA at 18:45 by Dr. Lorraine Lax Delivered tPA to RN at 18:49 Bolus infusion time: 18:52 Issues/delays encountered (if applicable): Had to wait on INR due to being on Coumadin prior to admission. INR was 1.48, aPTT 31, Plts 219.   Brain Hilts 07/11/17 6:46 PM

## 2017-07-11 NOTE — Consult Note (Signed)
PULMONARY / CRITICAL CARE MEDICINE   Name: Arthur [name] MRN: [id number] DOB: 04/17/1929    ADMISSION DATE:  07/11/2017 CONSULTATION DATE:  07/11/17  REFERRING MD:  Aroor  CHIEF COMPLAINT:  Left sided weakness  HISTORY OF PRESENT ILLNESS:  Pt is encephelopathic; therefore, this HPI is obtained from chart review. Arthur [name] is a 84 y.o. 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  He presented to [facility] ED 07/11/17 with left sided weakness and right gaze deviation.  CT was negative for hemorrhage.  CTA demonstrated concern for CVA and he was ruled in for tPA which he received.  He then was taken to IR where he had complete revascularization of right ACA with intraarterial tPA administered.  He returned to the ICU on the ventilator and PCCM was asked to assist with vent management.  PAST MEDICAL HISTORY :  He  has a past medical history of Arthritis, CKD (chronic kidney disease), stage II, Coronary artery disease, CVA (cerebrovascular accident) ([place]) (~ 2014), Esophagitis, GERD (gastroesophageal reflux disease), Heart murmur, History of hiatal hernia, History of recurrent TIAs, Hyperlipidemia, Hypertension, Hypertensive vascular disease, MVP (mitral valve prolapse), Nephrolithiasis, Odynophagia, PAF (paroxysmal atrial fibrillation) ([place]), and Transudative pleural effusion.  PAST SURGICAL HISTORY: He  has a past surgical history that includes MAZE (04/2004); Mitral valve repair (04/2004); TEE without cardioversion (05/19/2011); Tonsillectomy and adenoidectomy (1944); Appendectomy (1960's); Cardiac catheterization (03/19/2004); Coronary angioplasty with stent (01/24/2015); Coronary artery bypass graft (04/2004); Cardiac catheterization (N/A, 01/24/2015); and Cardiac catheterization (N/A, 01/24/2015).  Allergies  Allergen Reactions  . Tape Other (See Comments)    PATIENT IS TAKING COUMADIN; HIS  SKIN TEARS & BRUISES EASILY; PLEASE USE COBAN WRAP OR AN ALTERNATIVE TO MEDICAL TAPE!!  . Lasix [Furosemide] Rash  . Penicillins Rash    Has patient had a PCN reaction causing immediate rash, facial/tongue/throat swelling, SOB or lightheadedness with hypotension: Yes Has patient had a PCN reaction causing severe rash involving mucus membranes or skin necrosis: Unk Has patient had a PCN reaction that required hospitalization: Unk Has patient had a PCN reaction occurring within the last 10 years:  If all of the above answers are "NO", then may proceed with Cephalosporin use.   . Sulfa Drugs Cross Reactors Rash    No current facility-administered medications on file prior to encounter.    Current Outpatient Medications on File Prior to Encounter  Medication Sig  . amLODipine (NORVASC) 5 MG tablet TAKE ONE TABLET BY MOUTH DAILY (Patient taking differently: Take 5 mg by mouth once a day)  . benazepril (LOTENSIN) 40 MG tablet TAKE ONE TABLET BY MOUTH DAILY (Patient not taking: Reported on 07/11/2017)  . carvedilol (COREG) 25 MG tablet TAKE ONE TABLET BY MOUTH TWICE A DAY  . NITROSTAT 0.4 MG SL tablet PLACE 1 TABLET (0.4 MG TOTAL) UNDER THE TONGUE EVERY 5 (FIVE) MINUTESAS NEEDED FOR CHEST PAIN (UP TO 3 DOSES).  . ranitidine (ZANTAC) 150 MG tablet TAKE ONE TABLET BY MOUTH TWICE A DAY (Patient taking differently: Take 150 mg by mouth two times a day)  . rosuvastatin (CRESTOR) 20 MG tablet TAKE 1 TABLET BY MOUTH DAILY (Patient taking differently: Take 20 mg by mouth once a day)  . warfarin (COUMADIN) 2.5 MG tablet TAKE 1/2 TO 1 TABLET BY MOUTH DAILY AS DIRECTED BY COUMADIN CLINIC (Patient taking differently: Take 1.25 mg by mouth at bedtime on Sun/Tues/Thurs/Sat and 2.5 mg  on Mon/Wed/Fri)  . warfarin (COUMADIN) 2.5 MG tablet TAKE 1/2 TO 1 TABLET BY MOUTH DAILY AS DIRECTED BY COUMADIN CLINIC (Patient not taking: Reported on 07/11/2017)    FAMILY HISTORY:  His indicated that his mother is deceased. He  indicated that his father is deceased. He indicated that his son is deceased.   SOCIAL HISTORY: He  reports that he quit smoking about 62 years ago. His smoking use included cigarettes. He quit after 3.00 years of use. he has never used smokeless tobacco. He reports that he does not drink alcohol or use drugs.  REVIEW OF SYSTEMS:   Unable to obtain as pt is encephalopathic.  SUBJECTIVE:  On vent, unresponsive.  VITAL SIGNS: BP (!) 156/85 (BP Location: Left Arm)   Pulse 82   Temp 98.3 F (36.8 C)   Resp 18   Wt 80.2 kg (176 lb 12.9 oz)   SpO2 98%   BMI 27.69 kg/m   HEMODYNAMICS:    VENTILATOR SETTINGS:    INTAKE / OUTPUT: No intake/output data recorded.   PHYSICAL EXAMINATION: General: Elderly male, in NAD. Neuro:  Sedated, unresponsive. HEENT: [place]/AT. PERRL, sclerae anicteric. ETT in place. Cardiovascular: IRIR, no M/R/G.  Lungs: Respirations even and unlabored.  Coarse bilaterally. Abdomen: BS x 4, soft, NT/ND.  Musculoskeletal: No gross deformities, no edema.  Skin:  Some dried blood to right hand and scattered superficial ecchymosis.  Skin otherwise intact, warm, no rashes.  LABS:  BMET Recent Labs  Lab 07/11/17 1820 07/11/17 1826  NA 138 139  K 4.6 4.6  CL 107 105  CO2 21*  --   BUN 21* 21*  CREATININE 1.29* 1.20  GLUCOSE 111* 107*    Electrolytes Recent Labs  Lab 07/11/17 1820  CALCIUM 9.2    CBC Recent Labs  Lab 07/11/17 1820 07/11/17 1826  WBC 7.2  --   HGB 14.4 15.0  HCT 44.1 44.0  PLT 219  --     Coag's Recent Labs  Lab 07/11/17 1820  APTT 31  INR 1.48    Sepsis Markers No results for input(s): LATICACIDVEN, PROCALCITON, O2SATVEN in the last 168 hours.  ABG No results for input(s): PHART, PCO2ART, PO2ART in the last 168 hours.  Liver Enzymes Recent Labs  Lab 07/11/17 1820  AST 25  ALT 14*  ALKPHOS 57  BILITOT 0.9  ALBUMIN 4.3    Cardiac Enzymes No results for input(s): TROPONINI, PROBNP in the last 168  hours.  Glucose Recent Labs  Lab 07/11/17 1819  GLUCAP 90    Imaging Ct Angio Head W Or Wo Contrast  Result Date: 07/11/2017 CLINICAL DATA:  84 y/o  M; code stroke with left-sided deficits. EXAM: CT ANGIOGRAPHY HEAD AND NECK TECHNIQUE: Multidetector CT imaging of the head and neck was performed using the standard protocol during bolus administration of intravenous contrast. Multiplanar CT image reconstructions and MIPs were obtained to evaluate the vascular anatomy. Carotid stenosis measurements (when applicable) are obtained utilizing NASCET criteria, using the distal internal carotid diameter as the denominator. CONTRAST:  81mL ISOVUE-370 IOPAMIDOL (ISOVUE-370) INJECTION 76% COMPARISON:  07/11/2016 CT head.  05/15/2011 MRA head. FINDINGS: CTA NECK FINDINGS Aortic arch: Standard branching. Imaged portion shows no evidence of aneurysm or dissection. No significant stenosis of the major arch vessel origins. Moderate calcific atherosclerosis. Status post LIMA and saphenous CABG grafts. Right carotid system: No evidence of dissection, stenosis (50% or greater) or occlusion. Dense calcified plaque of the carotid bifurcation with mild less than 50% distal common carotid  and internal carotid artery stenosis. Left carotid system: No evidence of dissection, stenosis (50% or greater) or occlusion. Dense calcified plaque of carotid bifurcation with mild less than 50% proximal ICA stenosis. Calcified plaque of left mid common carotid artery with minimal less than 30% stenosis. Vertebral arteries: Codominant. No evidence of dissection, stenosis (50% or greater) or occlusion. Right V1 calcified plaque with mild less than 50% stenosis. Skeleton: Severe cervical spondylosis with extensive discogenic degenerative changes greatest at the C5-C7 levels and severe facet hypertrophy. Reversal of cervical curvature with apex at C5. Multiple levels of canal stenosis greatest at the C5-6 level where it is moderate to severe.  Uncovertebral and facet hypertrophy narrows the neural foramen greatest at the bilateral C5-6 and C6-7 levels. Other neck: Negative. Upper chest: Negative. Review of the MIP images confirms the above findings CTA HEAD FINDINGS Anterior circulation: Right M2 superior division proximal occlusion (series 7, image 90). Poor collateralization in the right M2 superior division distribution. Left distal M1 short segment of moderate stenosis. Bilateral carotid siphon calcific plaque with moderate bilateral paraclinoid stenosis. Posterior circulation: New focal calcification within the right P4 parietooccipital branch origin (series 8, image 116) with normal downstream opacification of the artery, possibly atherosclerotic or nonocclusive calcified plaque thrombus. Bilateral P1 irregularity with mild stenosis. Venous sinuses: As permitted by contrast timing, patent. Anatomic variants: Anterior communicating artery. No posterior communicating artery identified, likely hypoplastic or absent. Delayed phase: No abnormal intracranial enhancement. Review of the MIP images confirms the above findings IMPRESSION: CTA neck: 1. No dissection, aneurysm, or high-grade stenosis by NASCET criteria. 2. Calcified plaque of aorta and carotid bifurcations with mild less than 50% proximal ICA stenosis bilaterally. 3. Severe cervical spondylosis greatest at C5 and C6 levels. At least moderate C5-6 canal stenosis. CTA head: 1. Right M2 superior division proximal occlusion with poor downstream collateralization. 2. New calcification within right P 4 parieto-occipital origin, possibly atherosclerotic or nonocclusive calcified plaque thrombus. 3. Intracranial atherosclerosis with multiple areas of mild-to-moderate stenosis. No high-grade stenosis, aneurysm, or additional large vessel occlusion. These results were communicated to Dr. Lorraine Lax at [place] 1/28/2019by text page via the [facility] messaging system. Electronically Signed   By: Kristine Garbe M.D.   On: 07/11/2017 19:05   Ct Angio Neck W And/or Wo Contrast  Result Date: 07/11/2017 CLINICAL DATA:  84 y/o  M; code stroke with left-sided deficits. EXAM: CT ANGIOGRAPHY HEAD AND NECK TECHNIQUE: Multidetector CT imaging of the head and neck was performed using the standard protocol during bolus administration of intravenous contrast. Multiplanar CT image reconstructions and MIPs were obtained to evaluate the vascular anatomy. Carotid stenosis measurements (when applicable) are obtained utilizing NASCET criteria, using the distal internal carotid diameter as the denominator. CONTRAST:  1mL ISOVUE-370 IOPAMIDOL (ISOVUE-370) INJECTION 76% COMPARISON:  07/11/2016 CT head.  05/15/2011 MRA head. FINDINGS: CTA NECK FINDINGS Aortic arch: Standard branching. Imaged portion shows no evidence of aneurysm or dissection. No significant stenosis of the major arch vessel origins. Moderate calcific atherosclerosis. Status post LIMA and saphenous CABG grafts. Right carotid system: No evidence of dissection, stenosis (50% or greater) or occlusion. Dense calcified plaque of the carotid bifurcation with mild less than 50% distal common carotid and internal carotid artery stenosis. Left carotid system: No evidence of dissection, stenosis (50% or greater) or occlusion. Dense calcified plaque of carotid bifurcation with mild less than 50% proximal ICA stenosis. Calcified plaque of left mid common carotid artery with minimal less than 30% stenosis. Vertebral arteries: Codominant. No evidence  of dissection, stenosis (50% or greater) or occlusion. Right V1 calcified plaque with mild less than 50% stenosis. Skeleton: Severe cervical spondylosis with extensive discogenic degenerative changes greatest at the C5-C7 levels and severe facet hypertrophy. Reversal of cervical curvature with apex at C5. Multiple levels of canal stenosis greatest at the C5-6 level where it is moderate to severe. Uncovertebral and facet  hypertrophy narrows the neural foramen greatest at the bilateral C5-6 and C6-7 levels. Other neck: Negative. Upper chest: Negative. Review of the MIP images confirms the above findings CTA HEAD FINDINGS Anterior circulation: Right M2 superior division proximal occlusion (series 7, image 90). Poor collateralization in the right M2 superior division distribution. Left distal M1 short segment of moderate stenosis. Bilateral carotid siphon calcific plaque with moderate bilateral paraclinoid stenosis. Posterior circulation: New focal calcification within the right P4 parietooccipital branch origin (series 8, image 116) with normal downstream opacification of the artery, possibly atherosclerotic or nonocclusive calcified plaque thrombus. Bilateral P1 irregularity with mild stenosis. Venous sinuses: As permitted by contrast timing, patent. Anatomic variants: Anterior communicating artery. No posterior communicating artery identified, likely hypoplastic or absent. Delayed phase: No abnormal intracranial enhancement. Review of the MIP images confirms the above findings IMPRESSION: CTA neck: 1. No dissection, aneurysm, or high-grade stenosis by NASCET criteria. 2. Calcified plaque of aorta and carotid bifurcations with mild less than 50% proximal ICA stenosis bilaterally. 3. Severe cervical spondylosis greatest at C5 and C6 levels. At least moderate C5-6 canal stenosis. CTA head: 1. Right M2 superior division proximal occlusion with poor downstream collateralization. 2. New calcification within right P 4 parieto-occipital origin, possibly atherosclerotic or nonocclusive calcified plaque thrombus. 3. Intracranial atherosclerosis with multiple areas of mild-to-moderate stenosis. No high-grade stenosis, aneurysm, or additional large vessel occlusion. These results were communicated to Dr. Lorraine Lax at [place] 1/28/2019by text page via the [facility] messaging system. Electronically Signed   By: Kristine Garbe M.D.   On:  07/11/2017 19:05   Ct Head Code Stroke Wo Contrast  Result Date: 07/11/2017 CLINICAL DATA:  Code stroke. 84 y/o M; left-sided deficits and right-sided gaze. EXAM: CT HEAD WITHOUT CONTRAST TECHNIQUE: Contiguous axial images were obtained from the base of the skull through the vertex without intravenous contrast. COMPARISON:  05/13/2011 CT head. FINDINGS: Brain: Stable small chronic cortical infarcts in left frontal and right parietal lobes. Stable small chronic lacunar infarct in the right caudate body and right thalamus. Chronic infarction in the left inferior cerebellar hemisphere. Background of mild chronic microvascular ischemic changes and parenchymal volume loss of the brain. Vascular: Calcific atherosclerosis of carotid siphons. Hyperdensity within right distal M1 (series 6, image 21). Skull: Normal. Negative for fracture or focal lesion. Sinuses/Orbits: Left anterior ethmoid and frontal sinus partial opacification in chronic inflammatory changes. Right intra-ocular lens replacement. Other: None. ASPECTS [facility] Stroke Program Early CT Score) - Ganglionic level infarction (caudate, lentiform nuclei, internal capsule, insula, M1-M3 cortex): 7 - Supraganglionic infarction (M4-M6 cortex): 3 Total score (0-10 with 10 being normal): 10 IMPRESSION: 1. No acute intracranial abnormality identified. 2. ASPECTS is 10 3. Dense distal right M1, suspected thrombus. 4. Stable chronic microvascular ischemic changes, parenchymal volume loss, and small cortical infarctions. These results were called by telephone at the time of interpretation on 07/11/2017 at 6:43 pm to Dr. Lorraine Lax, who verbally acknowledged these results. Electronically Signed   By: Kristine Garbe M.D.   On: 07/11/2017 18:43    STUDIES:  CT head 1/28 > no hemorrhage, dense distal right M1. CTA head and neck 1/28 >  right M2 occlusion, new calcification in right parieto-occipital region. CT head 1/28 >  Echo 1/28 >  MRI brain 1/29  >  CULTURES: None.  ANTIBIOTICS: None.  SIGNIFICANT EVENTS: 1/28 > admitted with CVA > received systemic tPA and also taken to IR where he had complete revascularization of right ACA with intraarterial tPA administered.  LINES/TUBES: ETT 1/28 >   DISCUSSION: 84 y.o. male admitted with concern for CVA, received systemic tPA as well as intrarterial tPA followed by complete revascularization of occluded right ACA.  ASSESSMENT / PLAN:  PULMONARY A: Respiratory insufficiency - s/p intubation for IR procedure. P:   Assess CXR, ABG. Full vent support. Wean as able. VAP prevention measures. SBT in AM if neuro status allows. CXR in AM.  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 P:  Monitor hemodynamics. BP goals per neuro. F/u on echo. Continue preadmission statin. Hold preadmission coumadin, amlodipine, benazepril, carvedilol, nitro.  RENAL A:   No acute issues. P:   NS @ 75. BMP in AM.  GASTROINTESTINAL A:   GERD. Nutrition. P:   SUP: Pantoprazole. NPO. SLP eval once extubated.  HEMATOLOGIC A:   VTE Prophylaxis. P:  SCD's. CBC in AM.  INFECTIOUS A:   No indication of infection. P:   Monitor clinically.  ENDOCRINE A:   No acute issues. P:   F/u on A1c.  NEUROLOGIC A:   Right ACA occlusion - s/p systemic tPA followed by IA tPA as well as IR revascularization. Sedation due to mechanical ventilation. Hx CVA, TIA. P:   Neuro following / managing. Sedation:  Propofol gtt / Fentanyl PRN. RASS goal: 0 to -1. Daily WUA.  Family updated: None available.  Interdisciplinary Family Meeting v Palliative Care Meeting:  Due by: 07/17/17.  CC time: 30 min.   Montey Hora, [facility] Pager: [phone]  or [phone] 07/11/2017, 9:29 PM

## 2017-07-11 NOTE — Code Documentation (Addendum)
84 year old male presents to [facility] via [place] as code stroke called in the field.   Patient had driven his wife to the store - was LSW at 1715 when he had sudden onset left facial droop and difficulty speaking with left side weakness. Patient was met at the bridge by the stroke team - airway cleared by EDP - taken to CT scan - initial exam showed right forced gaze - left field cut - left neglect left hemiparesis arm and leg - slurred speech NIHSS 19.  Initial CT scan shows no blood - dense MCA - waiting on INR - patient is on Coumadin - CTA done.  Dr. Lorraine Lax at bedside - contacting IR - foley catheter placed - INR resulted 1.48 - tPA initiated at 1852.  BP stable for tPA infusion - see flow sheet for details.  Patient transferred to IR suite - wife at bedside - Dr. Estanislado Pandy at bedside - consented wife.  Patient placed on IR table with IR team and anetheshia present - report to [place].   Handoff to [place] - plan for tPA infusion discussed - BP remains stable - patient remains alert - denies headache - neuro exam same.   Gold wedding band, watch and clothes to include wallet given to wife.

## 2017-07-12 ENCOUNTER — Inpatient Hospital Stay (HOSPITAL_COMMUNITY): Payer: Medicare Other

## 2017-07-12 ENCOUNTER — Encounter (HOSPITAL_COMMUNITY): Payer: Self-pay | Admitting: Interventional Radiology

## 2017-07-12 DIAGNOSIS — I480 Paroxysmal atrial fibrillation: Secondary | ICD-10-CM

## 2017-07-12 DIAGNOSIS — G8194 Hemiplegia, unspecified affecting left nondominant side: Secondary | ICD-10-CM

## 2017-07-12 DIAGNOSIS — J9601 Acute respiratory failure with hypoxia: Secondary | ICD-10-CM

## 2017-07-12 DIAGNOSIS — I639 Cerebral infarction, unspecified: Secondary | ICD-10-CM

## 2017-07-12 DIAGNOSIS — J9600 Acute respiratory failure, unspecified whether with hypoxia or hypercapnia: Secondary | ICD-10-CM

## 2017-07-12 DIAGNOSIS — E785 Hyperlipidemia, unspecified: Secondary | ICD-10-CM

## 2017-07-12 DIAGNOSIS — I340 Nonrheumatic mitral (valve) insufficiency: Secondary | ICD-10-CM

## 2017-07-12 DIAGNOSIS — I63521 Cerebral infarction due to unspecified occlusion or stenosis of right anterior cerebral artery: Secondary | ICD-10-CM

## 2017-07-12 DIAGNOSIS — Z7901 Long term (current) use of anticoagulants: Secondary | ICD-10-CM

## 2017-07-12 DIAGNOSIS — I69391 Dysphagia following cerebral infarction: Secondary | ICD-10-CM

## 2017-07-12 LAB — URINALYSIS, ROUTINE W REFLEX MICROSCOPIC
BACTERIA UA: NONE SEEN
Bilirubin Urine: NEGATIVE
Glucose, UA: NEGATIVE mg/dL
KETONES UR: NEGATIVE mg/dL
Nitrite: NEGATIVE
PH: 5 (ref 5.0–8.0)
Protein, ur: NEGATIVE mg/dL
Specific Gravity, Urine: 1.044 — ABNORMAL HIGH (ref 1.005–1.030)

## 2017-07-12 LAB — RAPID URINE DRUG SCREEN, HOSP PERFORMED
Amphetamines: NOT DETECTED
Barbiturates: NOT DETECTED
Benzodiazepines: NOT DETECTED
Cocaine: NOT DETECTED
Opiates: NOT DETECTED
Tetrahydrocannabinol: NOT DETECTED

## 2017-07-12 LAB — CBC WITH DIFFERENTIAL/PLATELET
BASOS ABS: 0 10*3/uL (ref 0.0–0.1)
BASOS PCT: 0 %
EOS ABS: 0 10*3/uL (ref 0.0–0.7)
EOS PCT: 0 %
HCT: 38.5 % — ABNORMAL LOW (ref 39.0–52.0)
HEMOGLOBIN: 12.7 g/dL — AB (ref 13.0–17.0)
Lymphocytes Relative: 10 %
Lymphs Abs: 0.9 10*3/uL (ref 0.7–4.0)
MCH: 30 pg (ref 26.0–34.0)
MCHC: 33 g/dL (ref 30.0–36.0)
MCV: 90.8 fL (ref 78.0–100.0)
Monocytes Absolute: 0.4 10*3/uL (ref 0.1–1.0)
Monocytes Relative: 4 %
NEUTROS PCT: 86 %
Neutro Abs: 7.1 10*3/uL (ref 1.7–7.7)
PLATELETS: 196 10*3/uL (ref 150–400)
RBC: 4.24 MIL/uL (ref 4.22–5.81)
RDW: 13.9 % (ref 11.5–15.5)
WBC: 8.4 10*3/uL (ref 4.0–10.5)

## 2017-07-12 LAB — BASIC METABOLIC PANEL
Anion gap: 9 (ref 5–15)
BUN: 22 mg/dL — AB (ref 6–20)
CHLORIDE: 110 mmol/L (ref 101–111)
CO2: 19 mmol/L — ABNORMAL LOW (ref 22–32)
CREATININE: 1.17 mg/dL (ref 0.61–1.24)
Calcium: 8.3 mg/dL — ABNORMAL LOW (ref 8.9–10.3)
GFR, EST NON AFRICAN AMERICAN: 55 mL/min — AB (ref >60)
Glucose, Bld: 138 mg/dL — ABNORMAL HIGH (ref 65–99)
POTASSIUM: 4.1 mmol/L (ref 3.5–5.1)
SODIUM: 138 mmol/L (ref 135–145)

## 2017-07-12 LAB — ECHOCARDIOGRAM COMPLETE
AOASC: 38 cm
CHL CUP PV REG GRAD DIAS: 8 mmHg
CHL CUP REG VEL DIAS: 145 cm/s
EWDT: 327 ms
FS: 32 % (ref 28–44)
Height: 68 in
IV/PV OW: 0.84
LA diam index: 2.28 cm/m2
LA vol: 66.6 mL
LASIZE: 45 mm
LAVOLA4C: 66.4 mL
LAVOLIN: 33.7 mL/m2
LEFT ATRIUM END SYS DIAM: 45 mm
LV PW d: 11.5 mm — AB (ref 0.6–1.1)
LVOT area: 3.8 cm2
LVOT diameter: 22 mm
MV Dec: 327
MV pk E vel: 202 m/s
MVAP: 2.29 cm2
MVPG: 16 mmHg
MVPKAVEL: 91.3 m/s
MVSPHT: 96 ms
Reg peak vel: 322 cm/s
TAPSE: 15.2 mm
TRMAXVEL: 322 cm/s
Weight: 2828.94 oz

## 2017-07-12 LAB — LIPID PANEL
CHOLESTEROL: 122 mg/dL (ref 0–200)
HDL: 38 mg/dL — AB (ref >40)
LDL Cholesterol: 66 mg/dL (ref 0–99)
TRIGLYCERIDES: 92 mg/dL (ref <150)
Total CHOL/HDL Ratio: 3.2 RATIO
VLDL: 18 mg/dL (ref 0–40)

## 2017-07-12 LAB — HEMOGLOBIN A1C
HEMOGLOBIN A1C: 5.6 % (ref 4.8–5.6)
Mean Plasma Glucose: 114.02 mg/dL

## 2017-07-12 LAB — TRIGLYCERIDES: TRIGLYCERIDES: 93 mg/dL (ref <150)

## 2017-07-12 LAB — MRSA PCR SCREENING: MRSA BY PCR: NEGATIVE

## 2017-07-12 MED ORDER — CHLORHEXIDINE GLUCONATE 0.12% ORAL RINSE (MEDLINE KIT)
15.0000 mL | Freq: Two times a day (BID) | OROMUCOSAL | Status: DC
  Administered 2017-07-12: 15 mL via OROMUCOSAL

## 2017-07-12 MED ORDER — ORAL CARE MOUTH RINSE
15.0000 mL | OROMUCOSAL | Status: DC
  Administered 2017-07-12 (×5): 15 mL via OROMUCOSAL

## 2017-07-12 NOTE — Progress Notes (Signed)
SLP Cancellation Note  Patient Details Name: Arthur [name] MRN: [id number] DOB: [birth year]   Cancelled treatment:       Reason Eval/Treat Not Completed: Medical issues which prohibited therapy- remains on ventilator.  Will follow.   [name] 07/12/2017, 10:09 AM

## 2017-07-12 NOTE — Progress Notes (Signed)
STROKE TEAM PROGRESS NOTE   SUBJECTIVE (INTERVAL HISTORY) His wife is at the bedside. Pt still intubated off sedation. Fully awake and following commands on the right. Likely will be extubated soon. Still has left hemiplegia. At home on coumadin for afib but INR was 1.48 on admission. His cardiologist is Dr. Martinique.   OBJECTIVE Temp:  [94 F (34.4 C)-98.6 F (37 C)] 98.6 F (37 C) (01/29 0800) Pulse Rate:  [51-84] 70 (01/29 1100) Cardiac Rhythm: Normal sinus rhythm (01/29 0800) Resp:  [10-24] 14 (01/29 1100) BP: (99-180)/(50-96) 118/66 (01/29 1100) SpO2:  [95 %-100 %] 96 % (01/29 1100) Arterial Line BP: (99-166)/(49-74) 159/74 (01/29 0900) FiO2 (%):  [40 %-100 %] 40 % (01/29 1000) Weight:  [176 lb 12.9 oz (80.2 kg)] 176 lb 12.9 oz (80.2 kg) (01/28 1800)  Recent Labs  Lab 07/11/17 1819  GLUCAP 90   Recent Labs  Lab 07/11/17 1820 07/11/17 1826 07/12/17 0359  NA 138 139 138  K 4.6 4.6 4.1  CL 107 105 110  CO2 21*  --  19*  GLUCOSE 111* 107* 138*  BUN 21* 21* 22*  CREATININE 1.29* 1.20 1.17  CALCIUM 9.2  --  8.3*   Recent Labs  Lab 07/11/17 1820  AST 25  ALT 14*  ALKPHOS 57  BILITOT 0.9  PROT 6.6  ALBUMIN 4.3   Recent Labs  Lab 07/11/17 1820 07/11/17 1826 07/12/17 0359  WBC 7.2  --  8.4  NEUTROABS 4.7  --  7.1  HGB 14.4 15.0 12.7*  HCT 44.1 44.0 38.5*  MCV 90.9  --  90.8  PLT 219  --  196   No results for input(s): CKTOTAL, CKMB, CKMBINDEX, TROPONINI in the last 168 hours. Recent Labs    07/11/17 1820  LABPROT 17.8*  INR 1.48   Recent Labs    07/12/17 0420  COLORURINE YELLOW  LABSPEC 1.044*  PHURINE 5.0  GLUCOSEU NEGATIVE  HGBUR SMALL*  BILIRUBINUR NEGATIVE  KETONESUR NEGATIVE  PROTEINUR NEGATIVE  NITRITE NEGATIVE  LEUKOCYTESUR SMALL*       Component Value Date/Time   CHOL 122 07/12/2017 0359   TRIG 92 07/12/2017 0359   TRIG 93 07/12/2017 0359   HDL 38 (L) 07/12/2017 0359   CHOLHDL 3.2 07/12/2017 0359   VLDL 18 07/12/2017 0359    LDLCALC 66 07/12/2017 0359   Lab Results  Component Value Date   HGBA1C 5.6 07/12/2017      Component Value Date/Time   LABOPIA NONE DETECTED 07/12/2017 0420   COCAINSCRNUR NONE DETECTED 07/12/2017 0420   LABBENZ NONE DETECTED 07/12/2017 0420   AMPHETMU NONE DETECTED 07/12/2017 0420   THCU NONE DETECTED 07/12/2017 0420   LABBARB NONE DETECTED 07/12/2017 0420    Recent Labs  Lab 07/11/17 1820  ETH <10    I have personally reviewed the radiological images below and agree with the radiology interpretations.  Ct Angio Head W Or Wo Contrast  Result Date: 07/11/2017 CLINICAL DATA:  84 y/o  M; code stroke with left-sided deficits. EXAM: CT ANGIOGRAPHY HEAD AND NECK TECHNIQUE: Multidetector CT imaging of the head and neck was performed using the standard protocol during bolus administration of intravenous contrast. Multiplanar CT image reconstructions and MIPs were obtained to evaluate the vascular anatomy. Carotid stenosis measurements (when applicable) are obtained utilizing NASCET criteria, using the distal internal carotid diameter as the denominator. CONTRAST:  69mL ISOVUE-370 IOPAMIDOL (ISOVUE-370) INJECTION 76% COMPARISON:  07/11/2016 CT head.  05/15/2011 MRA head. FINDINGS: CTA NECK FINDINGS Aortic arch: Standard  branching. Imaged portion shows no evidence of aneurysm or dissection. No significant stenosis of the major arch vessel origins. Moderate calcific atherosclerosis. Status post LIMA and saphenous CABG grafts. Right carotid system: No evidence of dissection, stenosis (50% or greater) or occlusion. Dense calcified plaque of the carotid bifurcation with mild less than 50% distal common carotid and internal carotid artery stenosis. Left carotid system: No evidence of dissection, stenosis (50% or greater) or occlusion. Dense calcified plaque of carotid bifurcation with mild less than 50% proximal ICA stenosis. Calcified plaque of left mid common carotid artery with minimal less than 30%  stenosis. Vertebral arteries: Codominant. No evidence of dissection, stenosis (50% or greater) or occlusion. Right V1 calcified plaque with mild less than 50% stenosis. Skeleton: Severe cervical spondylosis with extensive discogenic degenerative changes greatest at the C5-C7 levels and severe facet hypertrophy. Reversal of cervical curvature with apex at C5. Multiple levels of canal stenosis greatest at the C5-6 level where it is moderate to severe. Uncovertebral and facet hypertrophy narrows the neural foramen greatest at the bilateral C5-6 and C6-7 levels. Other neck: Negative. Upper chest: Negative. Review of the MIP images confirms the above findings CTA HEAD FINDINGS Anterior circulation: Right M2 superior division proximal occlusion (series 7, image 90). Poor collateralization in the right M2 superior division distribution. Left distal M1 short segment of moderate stenosis. Bilateral carotid siphon calcific plaque with moderate bilateral paraclinoid stenosis. Posterior circulation: New focal calcification within the right P4 parietooccipital branch origin (series 8, image 116) with normal downstream opacification of the artery, possibly atherosclerotic or nonocclusive calcified plaque thrombus. Bilateral P1 irregularity with mild stenosis. Venous sinuses: As permitted by contrast timing, patent. Anatomic variants: Anterior communicating artery. No posterior communicating artery identified, likely hypoplastic or absent. Delayed phase: No abnormal intracranial enhancement. Review of the MIP images confirms the above findings IMPRESSION: CTA neck: 1. No dissection, aneurysm, or high-grade stenosis by NASCET criteria. 2. Calcified plaque of aorta and carotid bifurcations with mild less than 50% proximal ICA stenosis bilaterally. 3. Severe cervical spondylosis greatest at C5 and C6 levels. At least moderate C5-6 canal stenosis. CTA head: 1. Right M2 superior division proximal occlusion with poor downstream  collateralization. 2. New calcification within right P 4 parieto-occipital origin, possibly atherosclerotic or nonocclusive calcified plaque thrombus. 3. Intracranial atherosclerosis with multiple areas of mild-to-moderate stenosis. No high-grade stenosis, aneurysm, or additional large vessel occlusion. These results were communicated to Dr. Lorraine Lax at [place] 1/28/2019by text page via the [facility] messaging system. Electronically Signed   By: Kristine Garbe M.D.   On: 07/11/2017 19:05   Ct Head Wo Contrast  Result Date: 07/11/2017 CLINICAL DATA:  84 y/o M; post intra-arterial intervention for stroke. EXAM: CT HEAD WITHOUT CONTRAST TECHNIQUE: Contiguous axial images were obtained from the base of the skull through the vertex without intravenous contrast. COMPARISON:  07/11/2017 CT head and CT angiogram head. FINDINGS: Brain: Interval loss of gray-white differentiation in the right perisylvian frontal and parietal lobes (series 3, image 26 and series 6, image 21) likely representing developing acute infarction. No hemorrhage. Stable small chronic cortical infarcts in the frontal and right parietal lobes, small chronic infarct in left inferior cerebellar hemisphere, mild chronic microvascular ischemic changes, and parenchymal volume loss of the brain. Vascular: Contrast is present within the vascular structures. Skull: Normal. Negative for fracture or focal lesion. Sinuses/Orbits: Persistent partial opacification of left anterior ethmoid and frontal sinuses. Fluid level in the right maxillary sinus. Normal aeration of mastoid air cells. Other: None. IMPRESSION:  1. Interval loss of gray-white differentiation in the right perisylvian frontal and parietal lobes likely representing a developing acute infarction. No hemorrhage. 2. Stable background of chronic microvascular ischemic changes and parenchymal volume loss of the brain. Electronically Signed   By: Kristine Garbe M.D.   On: 07/11/2017 21:50    Ct Angio Neck W And/or Wo Contrast  Result Date: 07/11/2017 CLINICAL DATA:  84 y/o  M; code stroke with left-sided deficits. EXAM: CT ANGIOGRAPHY HEAD AND NECK TECHNIQUE: Multidetector CT imaging of the head and neck was performed using the standard protocol during bolus administration of intravenous contrast. Multiplanar CT image reconstructions and MIPs were obtained to evaluate the vascular anatomy. Carotid stenosis measurements (when applicable) are obtained utilizing NASCET criteria, using the distal internal carotid diameter as the denominator. CONTRAST:  4mL ISOVUE-370 IOPAMIDOL (ISOVUE-370) INJECTION 76% COMPARISON:  07/11/2016 CT head.  05/15/2011 MRA head. FINDINGS: CTA NECK FINDINGS Aortic arch: Standard branching. Imaged portion shows no evidence of aneurysm or dissection. No significant stenosis of the major arch vessel origins. Moderate calcific atherosclerosis. Status post LIMA and saphenous CABG grafts. Right carotid system: No evidence of dissection, stenosis (50% or greater) or occlusion. Dense calcified plaque of the carotid bifurcation with mild less than 50% distal common carotid and internal carotid artery stenosis. Left carotid system: No evidence of dissection, stenosis (50% or greater) or occlusion. Dense calcified plaque of carotid bifurcation with mild less than 50% proximal ICA stenosis. Calcified plaque of left mid common carotid artery with minimal less than 30% stenosis. Vertebral arteries: Codominant. No evidence of dissection, stenosis (50% or greater) or occlusion. Right V1 calcified plaque with mild less than 50% stenosis. Skeleton: Severe cervical spondylosis with extensive discogenic degenerative changes greatest at the C5-C7 levels and severe facet hypertrophy. Reversal of cervical curvature with apex at C5. Multiple levels of canal stenosis greatest at the C5-6 level where it is moderate to severe. Uncovertebral and facet hypertrophy narrows the neural foramen greatest  at the bilateral C5-6 and C6-7 levels. Other neck: Negative. Upper chest: Negative. Review of the MIP images confirms the above findings CTA HEAD FINDINGS Anterior circulation: Right M2 superior division proximal occlusion (series 7, image 90). Poor collateralization in the right M2 superior division distribution. Left distal M1 short segment of moderate stenosis. Bilateral carotid siphon calcific plaque with moderate bilateral paraclinoid stenosis. Posterior circulation: New focal calcification within the right P4 parietooccipital branch origin (series 8, image 116) with normal downstream opacification of the artery, possibly atherosclerotic or nonocclusive calcified plaque thrombus. Bilateral P1 irregularity with mild stenosis. Venous sinuses: As permitted by contrast timing, patent. Anatomic variants: Anterior communicating artery. No posterior communicating artery identified, likely hypoplastic or absent. Delayed phase: No abnormal intracranial enhancement. Review of the MIP images confirms the above findings IMPRESSION: CTA neck: 1. No dissection, aneurysm, or high-grade stenosis by NASCET criteria. 2. Calcified plaque of aorta and carotid bifurcations with mild less than 50% proximal ICA stenosis bilaterally. 3. Severe cervical spondylosis greatest at C5 and C6 levels. At least moderate C5-6 canal stenosis. CTA head: 1. Right M2 superior division proximal occlusion with poor downstream collateralization. 2. New calcification within right P 4 parieto-occipital origin, possibly atherosclerotic or nonocclusive calcified plaque thrombus. 3. Intracranial atherosclerosis with multiple areas of mild-to-moderate stenosis. No high-grade stenosis, aneurysm, or additional large vessel occlusion. These results were communicated to Dr. Lorraine Lax at [place] 1/28/2019by text page via the [facility] messaging system. Electronically Signed   By: Kristine Garbe M.D.   On: 07/11/2017 19:05  Mr Jodene Nam Head Wo  Contrast  Result Date: 07/12/2017 CLINICAL DATA:  LEFT-sided weakness, RIGHT gaze deficit. Status post tPA and complete revascularization of RIGHT ACA occlusion. EXAM: MRI HEAD WITHOUT CONTRAST MRA HEAD WITHOUT CONTRAST TECHNIQUE: Multiplanar, multiecho pulse sequences of the brain and surrounding structures were obtained without intravenous contrast. Angiographic images of the head were obtained using MRA technique without contrast. COMPARISON:  CT HEAD and CTA HEAD and neck July 11, 2016. MRI of the head May 15, 2011 FINDINGS: MRI HEAD FINDINGS INTRACRANIAL CONTENTS: Confluent RIGHT frontoparietal reduced diffusion including medial cortex with low ADC values. Discontinuous RIGHT frontal and RIGHT basal ganglia reduced diffusion and low ADC values. Subcentimeter reduced diffusion and low ADC values RIGHT posterior temporal lobe. Faint susceptibility artifact RIGHT temporoparietal lobes compatible with petechial hemorrhage. Scattered chronic micro hemorrhages. LEFT cerebellar encephalomalacia. Small area RIGHT temporal occipital and LEFT frontal encephalomalacia. Old small RIGHT cerebellar, RIGHT pontine, RIGHT thalamus and RIGHT basal ganglia to corona radiata infarct. Prominent basal ganglia perivascular spaces associated with chronic small vessel ischemic disease. A few additional subcentimeter supratentorial white matter FLAIR T2 hyperintensities compatible with mild chronic small vessel ischemic disease, less than expected for age. No advanced parenchymal brain volume loss for age. No midline shift, mass effect or masses. No abnormal extra-axial fluid collections. VASCULAR: Normal major intracranial vascular flow voids present at skull base. SKULL AND UPPER CERVICAL SPINE: No abnormal sellar expansion. No suspicious calvarial bone marrow signal. 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 Craniocervical junction maintained. SINUSES/ORBITS: Pan paranasal sinusitis with large  RIGHT maxillary sinus air-fluid level. Trace RIGHT mastoid effusion. Included ocular globes and orbital contents are non-suspicious. OTHER: None. MRA HEAD FINDINGS ANTERIOR CIRCULATION: Normal flow related enhancement of the included cervical, petrous, cavernous and supraclinoid internal carotid arteries. Moderate stenosis bilateral para clinoid internal carotid arteries. Patent anterior communicating artery. Patent anterior and middle cerebral arteries, bilateral anterior cerebral arteries distal segments on included on MRA. No large vessel occlusion, flow limiting stenosis, aneurysm. POSTERIOR CIRCULATION: Codominant vertebral arteries. Patent vertebrobasilar junction and basilar artery. Main branch vessels not well demonstrated. Severe stenosis RIGHT P1 segment, moderate LEFT P2 stenosis. RIGHT P3 occlusion with thready reconstitution. No  aneurysm. ANATOMIC VARIANTS: None. Source images and MIP images were reviewed. IMPRESSION: MRI head: 1. Acute moderate RIGHT MCA and small RIGHT distal ACA infarcts. Small RIGHT posterior watershed territory infarct. Minimal petechial hemorrhage without hemorrhagic conversion. 2. 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 Multiple small vessel supra-and infratentorial old infarcts. MRA head: 1. Successful revascularization of RIGHT middle cerebral artery occlusion. Distal anterior cerebral artery's not included on typical MRA head. 2. Focally occluded RIGHT P3 segment (possibly overestimated by calcification and resultant artifact) with immediate reconstitution. Severe stenosis RIGHT P1 segment. Electronically Signed   By: Elon Alas M.D.   On: 07/12/2017 02:04   Mr Brain Wo Contrast  Result Date: 07/12/2017 CLINICAL DATA:  LEFT-sided weakness, RIGHT gaze deficit. Status post tPA and complete revascularization of RIGHT ACA occlusion. EXAM: MRI HEAD WITHOUT CONTRAST MRA HEAD  WITHOUT CONTRAST TECHNIQUE: Multiplanar, multiecho pulse sequences of the brain and surrounding structures were obtained without intravenous contrast. Angiographic images of the head were obtained using MRA technique without contrast. COMPARISON:  CT HEAD and CTA HEAD and neck July 11, 2016. MRI of the head May 15, 2011 FINDINGS: MRI HEAD FINDINGS INTRACRANIAL CONTENTS: Confluent RIGHT frontoparietal reduced diffusion including medial cortex with low ADC values. Discontinuous RIGHT  frontal and RIGHT basal ganglia reduced diffusion and low ADC values. Subcentimeter reduced diffusion and low ADC values RIGHT posterior temporal lobe. Faint susceptibility artifact RIGHT temporoparietal lobes compatible with petechial hemorrhage. Scattered chronic micro hemorrhages. LEFT cerebellar encephalomalacia. Small area RIGHT temporal occipital and LEFT frontal encephalomalacia. Old small RIGHT cerebellar, RIGHT pontine, RIGHT thalamus and RIGHT basal ganglia to corona radiata infarct. Prominent basal ganglia perivascular spaces associated with chronic small vessel ischemic disease. A few additional subcentimeter supratentorial white matter FLAIR T2 hyperintensities compatible with mild chronic small vessel ischemic disease, less than expected for age. No advanced parenchymal brain volume loss for age. No midline shift, mass effect or masses. No abnormal extra-axial fluid collections. VASCULAR: Normal major intracranial vascular flow voids present at skull base. SKULL AND UPPER CERVICAL SPINE: No abnormal sellar expansion. No suspicious calvarial bone marrow signal. 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 Craniocervical junction maintained. SINUSES/ORBITS: Pan paranasal sinusitis with large RIGHT maxillary sinus air-fluid level. Trace RIGHT mastoid effusion. Included ocular globes and orbital contents are non-suspicious. OTHER: None. MRA HEAD FINDINGS ANTERIOR CIRCULATION: Normal flow related  enhancement of the included cervical, petrous, cavernous and supraclinoid internal carotid arteries. Moderate stenosis bilateral para clinoid internal carotid arteries. Patent anterior communicating artery. Patent anterior and middle cerebral arteries, bilateral anterior cerebral arteries distal segments on included on MRA. No large vessel occlusion, flow limiting stenosis, aneurysm. POSTERIOR CIRCULATION: Codominant vertebral arteries. Patent vertebrobasilar junction and basilar artery. Main branch vessels not well demonstrated. Severe stenosis RIGHT P1 segment, moderate LEFT P2 stenosis. RIGHT P3 occlusion with thready reconstitution. No  aneurysm. ANATOMIC VARIANTS: None. Source images and MIP images were reviewed. IMPRESSION: MRI head: 1. Acute moderate RIGHT MCA and small RIGHT distal ACA infarcts. Small RIGHT posterior watershed territory infarct. Minimal petechial hemorrhage without hemorrhagic conversion. 2. 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 Multiple small vessel supra-and infratentorial old infarcts. MRA head: 1. Successful revascularization of RIGHT middle cerebral artery occlusion. Distal anterior cerebral artery's not included on typical MRA head. 2. Focally occluded RIGHT P3 segment (possibly overestimated by calcification and resultant artifact) with immediate reconstitution. Severe stenosis RIGHT P1 segment. Electronically Signed   By: Elon Alas M.D.   On: 07/12/2017 02:04   Portable Chest Xray  Result Date: 07/12/2017 CLINICAL DATA:  84 year old male status post right MCA M2 vessel occlusion treated with tPA and revascularization. Intubated. EXAM: PORTABLE CHEST 1 VIEW COMPARISON:  07/11/2017 and earlier. FINDINGS: Portable AP semi upright view at 0617 hours. Stable endotracheal tube tip just below the clavicles. Stable lung volumes. Mediastinal contours remain within normal limits.  Calcified aortic atherosclerosis. Prior CABG. Streaky right mid and lower lung opacity with blunting of the right costophrenic angle appears chronic and not significantly changed compared to 2016. No pneumothorax, pulmonary edema or acute pulmonary opacity. IMPRESSION: 1. Stable endotracheal tube. 2. Chronic opacity in the right lower lung. No acute cardiopulmonary abnormality. Electronically Signed   By: Genevie Ann M.D.   On: 07/12/2017 08:40   Portable Chest X-ray  Result Date: 07/11/2017 CLINICAL DATA:  Respiratory failure EXAM: PORTABLE CHEST 1 VIEW COMPARISON:  Chest x-ray dated 01/13/2015. FINDINGS: Heart size and mediastinal contours are stable in size and configuration. Aortic atherosclerosis. Median sternotomy wires appear intact. Endotracheal tube is adequately positioned with tip approximately 5 cm above the carina. Chronic small pleural effusion and/or pleural thickening at the right lung base. No new lung findings. No pneumothorax seen. IMPRESSION: 1.  No active disease.  No evidence of pneumonia or pulmonary edema. 2. Chronic small right pleural effusion and/or pleural thickening, stable. 3. Aortic atherosclerosis. Electronically Signed   By: Franki Cabot M.D.   On: 07/11/2017 22:27   Ct Head Code Stroke Wo Contrast  Result Date: 07/11/2017 CLINICAL DATA:  Code stroke. 84 y/o M; left-sided deficits and right-sided gaze. EXAM: CT HEAD WITHOUT CONTRAST TECHNIQUE: Contiguous axial images were obtained from the base of the skull through the vertex without intravenous contrast. COMPARISON:  05/13/2011 CT head. FINDINGS: Brain: Stable small chronic cortical infarcts in left frontal and right parietal lobes. Stable small chronic lacunar infarct in the right caudate body and right thalamus. Chronic infarction in the left inferior cerebellar hemisphere. Background of mild chronic microvascular ischemic changes and parenchymal volume loss of the brain. Vascular: Calcific atherosclerosis of carotid siphons.  Hyperdensity within right distal M1 (series 6, image 21). Skull: Normal. Negative for fracture or focal lesion. Sinuses/Orbits: Left anterior ethmoid and frontal sinus partial opacification in chronic inflammatory changes. Right intra-ocular lens replacement. Other: None. ASPECTS [facility] Stroke Program Early CT Score) - Ganglionic level infarction (caudate, lentiform nuclei, internal capsule, insula, M1-M3 cortex): 7 - Supraganglionic infarction (M4-M6 cortex): 3 Total score (0-10 with 10 being normal): 10 IMPRESSION: 1. No acute intracranial abnormality identified. 2. ASPECTS is 10 3. Dense distal right M1, suspected thrombus. 4. Stable chronic microvascular ischemic changes, parenchymal volume loss, and small cortical infarctions. These results were called by telephone at the time of interpretation on 07/11/2017 at 6:43 pm to Dr. Lorraine Lax, who verbally acknowledged these results. Electronically Signed   By: Kristine Garbe M.D.   On: 07/11/2017 18:43    TTE pending   PHYSICAL EXAM  Temp:  [94 F (34.4 C)-98.6 F (37 C)] 98.6 F (37 C) (01/29 0800) Pulse Rate:  [51-84] 70 (01/29 1100) Resp:  [10-24] 14 (01/29 1100) BP: (99-180)/(50-96) 118/66 (01/29 1100) SpO2:  [95 %-100 %] 96 % (01/29 1100) Arterial Line BP: (99-166)/(49-74) 159/74 (01/29 0900) FiO2 (%):  [40 %-100 %] 40 % (01/29 1000) Weight:  [176 lb 12.9 oz (80.2 kg)] 176 lb 12.9 oz (80.2 kg) (01/28 1800)  General - Well nourished, well developed, intubated not on sedation.  Ophthalmologic - fundi not visualized due to noncooperation.  Cardiovascular - Regular rate and rhythm with PVCs.  Neuro - awake alert but intubated not on sedation, crying during interview. Able to follow simple peripheral and central commands. Left eye mild ptosis. PERRL, right gaze preference, barely cross midline to left. Blinking to visual threat on the right but not on the left. Facial symmetry difficult to exam due to ET tube. RUE and RLE 5/5,  spontaneous purposeful movement. LUE and LLE hemiplegic. On pain stimulation, mild withdraw on the LLE. DTR 1+ and no babinski. Sensation, coordination and gait not tested.   ASSESSMENT/PLAN Arthur [name] is a 84 y.o. 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 TPA given    Stroke:  right MCA and ACA patchy scattered moderate to large infarct s/p IV and IA tPA, embolic secondary to afib on coumadin with subtherapeutic INR  Resultant left hemiplegia, right gaze preference  MRI  Right MCA and ACA acute infarcts, old left large cerebellar infarct  MRA  Right MCA recanalized after IR  CTA head and neck -  right M2 cut off, b/l ICA proximal 50% stenosis  2D Echo  pending  LDL 66  HgbA1c 5.6  SCDs for VTE prophylaxis  Diet NPO time specified   warfarin daily prior to admission, now on No antithrombotic 24h within tPA  Ongoing aggressive stroke risk factor management  Therapy recommendations:  pending  Disposition:  Pending  Afib on coumadin  INR 1.48 on admission  Follows with Dr. Martinique cardiology  Hold off [facility] for now due to moderate to large right MCA and ACA infarcts  Will consider resume AC in 5-7 days  Will consider NOACs, will discuss with Dr. Martinique.   History of stroke  05/2011 left large cerebellar infarct  Concerning for cardiac source  Put on DAPT at that time  recommended outpt TEE and long term heart monitoring  Hypertension Stable BP goal 120-160 post procedure  Long term BP goal normotensive  Hyperlipidemia  Home meds:  crestor   LDL 66, goal < 70  Resume crestor once po access  Continue statin at discharge  Other Stroke Risk Factors  Advanced age  Coronary artery disease s/p CABG  LAA thrombus S/p MAZE  Other Active Problems    Hospital  day # 1  This patient is critically ill due to right MCA and ACA Infarcts s/p tPA, and IA tPA, afib on coumadin and at significant risk of neurological worsening, death form recurrent stroke, hemorrhagic conversion, heart failure, seizure. This patient's care requires constant monitoring of vital signs, hemodynamics, respiratory and cardiac monitoring, review of multiple databases, neurological assessment, discussion with family, other specialists and medical decision making of high complexity. I had long discussion with wife at bedside, updated pt current condition, treatment plan and potential prognosis. She expressed understanding and appreciation. Also discussed with Dr. Nils Pyle in the hallway.  I spent 40 minutes of neurocritical care time in the care of this patient.   Rosalin Hawking, MD PhD Stroke Neurology 07/12/2017 11:18 AM    To contact Stroke Continuity provider, please refer to [email or web address] After hours, contact General Neurology

## 2017-07-12 NOTE — Progress Notes (Signed)
OT cancellation    07/12/17 0700  OT Visit Information  Last OT Received On 07/12/17  Reason Eval/Treat Not Completed Patient not medically ready. Pt with active bedrest orders. Will await increase in activity orders prior to initiating OT evaluation. Thank you.   [place], OTR/L Acute Rehab Pager: [phone] Office: [phone]

## 2017-07-12 NOTE — Evaluation (Signed)
Speech Language Pathology Evaluation Patient Details Name: Arthur [name] MRN: [id number] DOB: 05-24-1929 Today's Date: 07/12/2017 Time: 1324-4010 SLP Time Calculation (min) (ACUTE ONLY): 27 min  Problem List:  Patient Active Problem List   Diagnosis Date Noted  . Ischemic stroke ([place]) 07/11/2017  . Acute arterial ischemic stroke, multifocal, anterior circulation ([place]) 07/11/2017  . Acute respiratory failure ([place])   . Abnormal nuclear stress test 01/24/2015  . Encounter for therapeutic drug monitoring 07/19/2013  . Long term current use of anticoagulant 05/28/2011  . Dizziness 05/14/2011  . Hyperlipidemia   . Hypertension   . Coronary artery disease   . MVP (mitral valve prolapse)   . Paroxysmal atrial fibrillation (H[facility])   . Chronic renal insufficiency   . CVA (cerebrovascular accident) [facility])    Past Medical History:  Past Medical History:  Diagnosis Date  . Arthritis    "some in my joints" (01/24/2015)  . CKD (chronic kidney disease), stage II   . Coronary artery disease    a. s/p CABG in 2005 with MV repair and MAZE procedure. b. s/p DES to prox RCA 01/2015.   Marland Kitchen CVA (cerebrovascular accident) [facility]) ~ 2014   RIGHT BRAIN; denies residual on 01/24/2015  . Esophagitis    Distal esophagitis  . GERD (gastroesophageal reflux disease)   . Heart murmur   . History of hiatal hernia   . History of recurrent TIAs   . Hyperlipidemia   . Hypertension   . Hypertensive vascular disease   . MVP (mitral valve prolapse)   . Nephrolithiasis   . Odynophagia   . PAF (paroxysmal atrial fibrillation) ([place])   . Transudative pleural effusion    Past Surgical History:  Past Surgical History:  Procedure Laterality Date  . APPENDECTOMY  1960's  . CARDIAC CATHETERIZATION  03/19/2004  . CARDIAC CATHETERIZATION N/A 01/24/2015   Procedure: Left Heart Cath and Coronary Angiography;  Surgeon: Peter M Martinique, MD;  Location: [place] CV LAB;  Service: Cardiovascular;  Laterality: N/A;  . CARDIAC  CATHETERIZATION N/A 01/24/2015   Procedure: Coronary Stent Intervention;  Surgeon: Peter M Martinique, MD;  Location: [place] CV LAB;  Service: Cardiovascular;  Laterality: N/A;  . CORONARY ANGIOPLASTY WITH STENT PLACEMENT  01/24/2015   "1 stent"  . CORONARY ARTERY BYPASS GRAFT  04/2004   LIMA GRAFT TO THE DISTAL LAD, SAPHENOUS VEIN GRAFT TO THE FIRST DIADGONAL BRANCH, SAPHENOUS VEIN GRAFT TO THE THIRD MARIGINAL BRANCH, AND SAPHENOUS VEIN GRAFT TO THE PDA  . MAZE  04/2004  . MITRAL VALVE REPAIR  04/2004  . RADIOLOGY WITH ANESTHESIA N/A 07/11/2017   Procedure: RADIOLOGY WITH ANESTHESIA;  Surgeon: Luanne Bras, MD;  Location: [place];  Service: Radiology;  Laterality: N/A;  . TEE WITHOUT CARDIOVERSION  05/19/2011   Procedure: TRANSESOPHAGEAL ECHOCARDIOGRAM (TEE);  Surgeon: Peter Martinique, MD;  Location: [facility] ENDOSCOPY;  Service: Cardiovascular;  Laterality: N/A;  . TONSILLECTOMY AND ADENOIDECTOMY  1944   HPI:  84 y.o.malewith history of afib on coumadin, LAA thrombus s/p MAZE procedure, CAD s/p CABG, stroke in 05/2011 with left large cerebellar infarct, HTN, HLDadmitted for left hemiplegia and right gaze, left neglect and left facial droop.TPA given. Successful revascularization Rt MCA on 1/29. MRI rightMCA and ACA patchy scattered moderate to largeinfarct; old large LEFT cerebellar infarct, old small LEFT frontal/MCA territory and RIGHT parietoccipital/posterior watershed territory infarcts.   Assessment / Plan / Recommendation Clinical Impression  Pt presents with a spastic dysarthria impacting intelligibility; left inattention; emotional lability. He has preserved sense of  humor and relatively intact recall/attention.  Deficits in verbal sequencing noted.  Recommend acute SLP to address speech clarity and more complex cognitive assessment.  Discussed results/recs with pt and his wife, who verbalize agreement.      SLP Assessment  SLP Recommendation/Assessment: Patient needs continued Speech  [place] [facility] SLP Visit Diagnosis: Cognitive communication deficit (R41.841);Dysarthria and anarthria (R47.1)    Follow Up Recommendations  Inpatient Rehab    Frequency and Duration min 2x/week  2 weeks      SLP Evaluation Cognition  Overall Cognitive Status: Impaired/Different from baseline Arousal/Alertness: Awake/alert Orientation Level: Oriented X4 Attention: Selective Selective Attention: Appears intact Memory: Appears intact Awareness: Impaired Executive Function: Sequencing Sequencing: Impaired Sequencing Impairment: Verbal complex Behaviors: Lability       Comprehension  Auditory Comprehension Overall Auditory Comprehension: Appears within functional limits for tasks assessed    Expression Expression Primary Mode of Expression: Verbal Verbal Expression Overall Verbal Expression: Appears within functional limits for tasks assessed Written Expression Dominant Hand: Right   Oral / Motor  Oral Motor/Sensory Function Overall Oral Motor/Sensory Function: Moderate impairment Facial ROM: Reduced left;Suspected CN VII (facial) dysfunction Facial Symmetry: Abnormal symmetry left;Suspected CN VII (facial) dysfunction Facial Strength: Reduced left;Suspected CN VII (facial) dysfunction Facial Sensation: Reduced left;Suspected CN V (Trigeminal) dysfunction Lingual Symmetry: Within Functional Limits Lingual Strength: Within Functional Limits Velum: Within Functional Limits Mandible: Within Functional Limits Motor Speech Overall Motor Speech: Impaired Respiration: Impaired Level of Impairment: Phrase Phonation: Hoarse(harsh) Articulation: Impaired Level of Impairment: Phrase Intelligibility: Intelligibility reduced Phrase: 50-74% accurate Sentence: 50-74% accurate Motor Planning: Witnin functional limits   GO                    Arthur [name] 07/12/2017, 3:19 PM  Arthur [name] L. Arthur [name], [place] CCC/SLP Pager [phone]

## 2017-07-12 NOTE — Consult Note (Signed)
Physical Medicine and Rehabilitation Consult   Reason for Consult: Stroke with functional deficits Referring Physician: Dr. Erlinda Hong   HPI: Arthur [name] is a 84 y.o. male with history of CKD, CAD s/p CABG, CVA, A fib s/p MAZE- chronic coumadin who was admitted on 07/11/17 with left sided weakness with right gaze preference. INR subtherapeutic at admission and CTA head neck showed calcified plaque of aorta and carotid bifurcation, right M2 superior division proximal occlusion with poor down stream collateralization and multiple areas of mild to moderate intracranial atherosclerosis. He underwent cerebral angio with complete revascularization of R-ACA with IA tPA and IV tPA.  He tolerated extubation without difficulty on 1/29.  MRI/MRA brain done revealing acute moderate R-MCA, small right distal ACA infarcts, small right posterior watershed territory infarct with minimal petechial hemorrhage and old large left cerebellar infarct with successful revascularization of R-MCA occlusion.   Therapy evaluations done revealing spastic dysarthria, left inattention as well as left hemiparesis affecting functional status. CIR recommended due to current impairments.    Review of Systems  Constitutional: Negative for chills and fever.  HENT: Negative for hearing loss and tinnitus.   Eyes: Negative for blurred vision and double vision.  Respiratory: Negative for cough and hemoptysis.   Cardiovascular: Negative for chest pain and palpitations.  Gastrointestinal: Negative for abdominal pain, heartburn and nausea.  Genitourinary: Negative for dysuria.  Musculoskeletal: Positive for joint pain (right knee due to old football injury).  Neurological: Positive for speech change, focal weakness and weakness. Negative for dizziness and headaches.  Psychiatric/Behavioral: Negative for depression. The patient is not nervous/anxious.     Past Medical History:  Diagnosis Date  . Arthritis    "some in my joints"  (01/24/2015)  . CKD (chronic kidney disease), stage II   . Coronary artery disease    a. s/p CABG in 2005 with MV repair and MAZE procedure. b. s/p DES to prox RCA 01/2015.   Marland Kitchen CVA (cerebrovascular accident) [facility]) ~ 2014   RIGHT BRAIN; denies residual on 01/24/2015  . Esophagitis    Distal esophagitis  . GERD (gastroesophageal reflux disease)   . Heart murmur   . History of hiatal hernia   . History of recurrent TIAs   . Hyperlipidemia   . Hypertension   . Hypertensive vascular disease   . MVP (mitral valve prolapse)   . Nephrolithiasis   . Odynophagia   . PAF (paroxysmal atrial fibrillation) ([place])   . Transudative pleural effusion     Past Surgical History:  Procedure Laterality Date  . APPENDECTOMY  1960's  . CARDIAC CATHETERIZATION  03/19/2004  . CARDIAC CATHETERIZATION N/A 01/24/2015   Procedure: Left Heart Cath and Coronary Angiography;  Surgeon: Peter M Martinique, MD;  Location: [place] [facility];  Service: Cardiovascular;  Laterality: N/A;  . CARDIAC CATHETERIZATION N/A 01/24/2015   Procedure: Coronary Stent Intervention;  Surgeon: Peter M Martinique, MD;  Location: [place] [facility];  Service: Cardiovascular;  Laterality: N/A;  . CORONARY ANGIOPLASTY WITH STENT PLACEMENT  01/24/2015   "1 stent"  . CORONARY ARTERY BYPASS GRAFT  04/2004   LIMA GRAFT TO THE DISTAL LAD, SAPHENOUS VEIN GRAFT TO THE FIRST DIADGONAL BRANCH, SAPHENOUS VEIN GRAFT TO THE THIRD MARIGINAL BRANCH, AND SAPHENOUS VEIN GRAFT TO THE PDA  . MAZE  04/2004  . MITRAL VALVE REPAIR  04/2004  . RADIOLOGY WITH ANESTHESIA N/A 07/11/2017   Procedure: RADIOLOGY WITH ANESTHESIA;  Surgeon: Luanne Bras, MD;  Location: [place];  Service:  Radiology;  Laterality: N/A;  . TEE WITHOUT CARDIOVERSION  05/19/2011   Procedure: TRANSESOPHAGEAL ECHOCARDIOGRAM (TEE);  Surgeon: Peter Martinique, MD;  Location[facility] ENDOSCOPY;  Service: Cardiovascular;  Laterality: N/A;  . TONSILLECTOMY AND ADENOIDECTOMY  1944    Family History  Problem  Relation Age of Onset  . Heart attack Mother   . Stroke Father   . Hypertension Father   . Kidney failure Father     Social History:  Married. Retired Hotel manager. Was independent and relatively active per wife. Per reports that he quit smoking about 62 years ago. His smoking use included cigarettes. He quit after 3.00 years of use. he has never used smokeless tobacco. He reports that he does not drink alcohol or use drugs.    Allergies  Allergen Reactions  . Tape Other (See Comments)    PATIENT IS TAKING COUMADIN; HIS SKIN TEARS & BRUISES EASILY; PLEASE USE COBAN WRAP OR AN ALTERNATIVE TO MEDICAL TAPE!!  . Lasix [Furosemide] Rash  . Penicillins Rash    Has patient had a PCN reaction causing immediate rash, facial/tongue/throat swelling, SOB or lightheadedness with hypotension: Yes Has patient had a PCN reaction causing severe rash involving mucus membranes or skin necrosis: Unk Has patient had a PCN reaction that required hospitalization: Unk Has patient had a PCN reaction occurring within the last 10 years:  If all of the above answers are "NO", then may proceed with Cephalosporin use.   . Sulfa Drugs Cross Reactors Rash    Medications Prior to Admission  Medication Sig Dispense Refill  . acetaminophen (TYLENOL) 650 MG CR tablet Take 650 mg by mouth daily as needed (for knee pain).    Marland Kitchen amLODipine (NORVASC) 5 MG tablet TAKE ONE TABLET BY MOUTH DAILY (Patient taking differently: Take 5 mg by mouth once a day) 90 tablet 1  . benazepril (LOTENSIN) 40 MG tablet TAKE ONE TABLET BY MOUTH DAILY (Patient taking differently: Take 40 mg by mouth once a day) 78 tablet 3  . carvedilol (COREG) 25 MG tablet TAKE ONE TABLET BY MOUTH TWICE A DAY (Patient taking differently: Take 25 mg by mouth two times a day) 60 tablet 5  . NITROSTAT 0.4 MG SL tablet PLACE 1 TABLET (0.4 MG TOTAL) UNDER THE TONGUE EVERY 5 (FIVE) MINUTESAS NEEDED FOR CHEST PAIN (UP TO 3 DOSES). 25 tablet 2  . ranitidine (ZANTAC) 150 MG  tablet TAKE ONE TABLET BY MOUTH TWICE A DAY (Patient taking differently: Take 150 mg by mouth two times a day) 180 tablet 2  . rosuvastatin (CRESTOR) 20 MG tablet TAKE 1 TABLET BY MOUTH DAILY (Patient taking differently: Take 20 mg by mouth at bedtime) 90 tablet 2  . warfarin (COUMADIN) 2.5 MG tablet TAKE 1/2 TO 1 TABLET BY MOUTH DAILY AS DIRECTED BY COUMADIN CLINIC (Patient taking differently: Take 1.25 mg by mouth at bedtime on Sun/Tues/Thurs/Sat and 2.5 mg on Mon/Wed/Fri) 30 tablet 2  . warfarin (COUMADIN) 2.5 MG tablet TAKE 1/2 TO 1 TABLET BY MOUTH DAILY AS DIRECTED BY COUMADIN CLINIC (Patient not taking: Reported on 07/11/2017) 90 tablet 1    Home: Home Living Family/patient expects to be discharged to:: Private residence Living Arrangements: Spouse/significant other Available Help at Discharge: Family, Available 24 hours/day Type of Home: House Home Access: Stairs to enter Technical brewer of Steps: 4 Entrance Stairs-Rails: Left Home Layout: One level Bathroom Shower/Tub: Multimedia programmer: Standard Bathroom Accessibility: Yes Home Equipment: Wheelchair - manual  Lives With: Spouse  Functional History: Prior Function Level  of Independence: Independent Comments: ADLs, IADLs, driving Functional Status:  Mobility: Bed Mobility Overal bed mobility: Needs Assistance Bed Mobility: Rolling, Sidelying to Sit, Supine to Sit Rolling: Max assist, +2 for physical assistance Sidelying to sit: Max assist, +2 for physical assistance Supine to sit: Total assist, +2 for physical assistance General bed mobility comments: Max assist of 2 persons for rolling to left side, multi modal cues for RUE placement on bed rail, increased extensor tone noted throughout left side requiring maximal to total assist to elevate to upright and rotate to EOB. Total assist to return to supine post activity Transfers Overall transfer level: Needs assistance Equipment used: 2 person hand held  assist(face to face  with gait belt) Transfers: Sit to/from Stand Sit to Stand: Max assist, +2 physical assistance General transfer comment: +2 max assist to power to standing with face to face and gait belt. LLE positioned prior to initiation of mevement, noted LLE clonus requiring manual block out. Use of extensor tone to facilitate power up.      ADL: ADL Overall ADL's : Needs assistance/impaired Eating/Feeding: NPO Grooming: Wash/dry face, Maximal assistance, Sitting Grooming Details (indicate cue type and reason): Max A to maintain sitting at EOB. Pt washing his face with his RUE requiring increased time for processing.  Upper Body Bathing: Moderate assistance, Bed level(Supported sitting) Lower Body Bathing: Maximal assistance, +2 for physical assistance, Sit to/from stand, Bed level Upper Body Dressing : Moderate assistance, Bed level(Supported sitting) Lower Body Dressing: Maximal assistance, Bed level, Sit to/from stand, +2 for physical assistance Lower Body Dressing Details (indicate cue type and reason): don socks with Max A Functional mobility during ADLs: Maximal assistance, +2 for physical assistance(Sit<>stand only) General ADL Comments: Pt demonstrating decreased fucntional performance with poor functional use of LUE, vision, balance, and cognition.   Cognition: Cognition Overall Cognitive Status: Impaired/Different from baseline Arousal/Alertness: Awake/alert Orientation Level: Oriented X4 Attention: Selective Selective Attention: Appears intact Memory: Appears intact Awareness: Impaired Executive Function: Sequencing Sequencing: Impaired Sequencing Impairment: Verbal complex Behaviors: Lability Cognition Arousal/Alertness: Awake/alert Behavior During Therapy: WFL for tasks assessed/performed Overall Cognitive Status: Impaired/Different from baseline Area of Impairment: Following commands, Safety/judgement, Awareness, Problem solving, Attention Current  Attention Level: Sustained Following Commands: Follows one step commands with increased time Safety/Judgement: Decreased awareness of deficits Awareness: Intellectual Problem Solving: Requires verbal cues, Requires tactile cues, Slow processing General Comments: Patient with decreased attention to left side, multi modal cues to attend. visual impairments noted. Required increased time anc cues during ADLs, mobility, and answering therpist questions   Blood pressure (!) 144/65, pulse 79, temperature 98.4 F (36.9 C), temperature source Axillary, resp. rate (!) 22, height 5\' 8"  (1.727 m), weight 80.2 kg (176 lb 12.9 oz), SpO2 93 %. Physical Exam  Nursing note and vitals reviewed. Constitutional: He is oriented to person, place, and time. He appears well-developed and well-nourished. He appears lethargic. He is easily aroused. No distress.  Lying in bed with head turned to the left.   HENT:  Head: Normocephalic and atraumatic.  Mouth/Throat: Oropharynx is clear and moist.  Eyes: Pupils are equal, round, and reactive to light. Left eye exhibits exudate. Left conjunctiva is injected.  Neck: Normal range of motion. Neck supple.  Cardiovascular: Normal rate and regular rhythm.  Murmur heard. Respiratory: Effort normal. No stridor. He has rhonchi.  GI: Soft. Bowel sounds are normal. He exhibits no distension. There is no tenderness.  Musculoskeletal: He exhibits tenderness. He exhibits no edema.  Neurological: He is oriented to person, place,  and time and easily aroused. He appears lethargic.  Left facial droop with significant ptosis. Moderate dysarthria with occasional cough due to difficulty handling oral secretions. Sleepy but aroused without difficulty. Delayed processing but he was able to follow simple motor commands without difficulty. Dense left hemiparesis with extensor tone.     Skin: Skin is warm and dry. He is not diaphoretic.  Psychiatric: His affect is blunt. His speech is delayed  and slurred. He is slowed.    Results for orders placed or performed during the hospital encounter of 07/11/17 (from the past 24 hour(s))  CBC     Status: Abnormal   Collection Time: 07/13/17  3:50 AM  Result Value Ref Range   WBC 9.5 4.0 - 10.5 K/uL   RBC 4.29 4.22 - 5.81 MIL/uL   Hemoglobin 12.8 (L) 13.0 - 17.0 g/dL   HCT 39.3 39.0 - 52.0 %   MCV 91.6 78.0 - 100.0 fL   MCH 29.8 26.0 - 34.0 pg   MCHC 32.6 30.0 - 36.0 g/dL   RDW 13.8 11.5 - 15.5 %   Platelets 195 150 - 400 K/uL  Basic metabolic panel     Status: Abnormal   Collection Time: 07/13/17  3:50 AM  Result Value Ref Range   Sodium 140 135 - 145 mmol/L   Potassium 3.4 (L) 3.5 - 5.1 mmol/L   Chloride 110 101 - 111 mmol/L   CO2 20 (L) 22 - 32 mmol/L   Glucose, Bld 107 (H) 65 - 99 mg/dL   BUN 15 6 - 20 mg/dL   Creatinine, Ser 1.10 0.61 - 1.24 mg/dL   Calcium 8.5 (L) 8.9 - 10.3 mg/dL   GFR calc non Af Amer 59 (L) >60 mL/min   GFR calc Af Amer >60 >60 mL/min   Anion gap 10 5 - 15  Hemoglobin A1c     Status: None   Collection Time: 07/13/17  3:50 AM  Result Value Ref Range   Hgb A1c MFr Bld 5.6 4.8 - 5.6 %   Mean Plasma Glucose 114.02 mg/dL   Ct Angio Head W Or Wo Contrast  Result Date: 07/11/2017 CLINICAL DATA:  84 y/o  M; code stroke with left-sided deficits. EXAM: CT ANGIOGRAPHY HEAD AND NECK TECHNIQUE: Multidetector CT imaging of the head and neck was performed using the standard protocol during bolus administration of intravenous contrast. Multiplanar CT image reconstructions and MIPs were obtained to evaluate the vascular anatomy. Carotid stenosis measurements (when applicable) are obtained utilizing NASCET criteria, using the distal internal carotid diameter as the denominator. CONTRAST:  77mL ISOVUE-370 IOPAMIDOL (ISOVUE-370) INJECTION 76% COMPARISON:  07/11/2016 CT head.  05/15/2011 MRA head. FINDINGS: CTA NECK FINDINGS Aortic arch: Standard branching. Imaged portion shows no evidence of aneurysm or dissection. No  significant stenosis of the major arch vessel origins. Moderate calcific atherosclerosis. Status post LIMA and saphenous CABG grafts. Right carotid system: No evidence of dissection, stenosis (50% or greater) or occlusion. Dense calcified plaque of the carotid bifurcation with mild less than 50% distal common carotid and internal carotid artery stenosis. Left carotid system: No evidence of dissection, stenosis (50% or greater) or occlusion. Dense calcified plaque of carotid bifurcation with mild less than 50% proximal ICA stenosis. Calcified plaque of left mid common carotid artery with minimal less than 30% stenosis. Vertebral arteries: Codominant. No evidence of dissection, stenosis (50% or greater) or occlusion. Right V1 calcified plaque with mild less than 50% stenosis. Skeleton: Severe cervical spondylosis with extensive discogenic degenerative changes greatest  at the C5-C7 levels and severe facet hypertrophy. Reversal of cervical curvature with apex at C5. Multiple levels of canal stenosis greatest at the C5-6 level where it is moderate to severe. Uncovertebral and facet hypertrophy narrows the neural foramen greatest at the bilateral C5-6 and C6-7 levels. Other neck: Negative. Upper chest: Negative. Review of the MIP images confirms the above findings CTA HEAD FINDINGS Anterior circulation: Right M2 superior division proximal occlusion (series 7, image 90). Poor collateralization in the right M2 superior division distribution. Left distal M1 short segment of moderate stenosis. Bilateral carotid siphon calcific plaque with moderate bilateral paraclinoid stenosis. Posterior circulation: New focal calcification within the right P4 parietooccipital branch origin (series 8, image 116) with normal downstream opacification of the artery, possibly atherosclerotic or nonocclusive calcified plaque thrombus. Bilateral P1 irregularity with mild stenosis. Venous sinuses: As permitted by contrast timing, patent. Anatomic  variants: Anterior communicating artery. No posterior communicating artery identified, likely hypoplastic or absent. Delayed phase: No abnormal intracranial enhancement. Review of the MIP images confirms the above findings IMPRESSION: CTA neck: 1. No dissection, aneurysm, or high-grade stenosis by NASCET criteria. 2. Calcified plaque of aorta and carotid bifurcations with mild less than 50% proximal ICA stenosis bilaterally. 3. Severe cervical spondylosis greatest at C5 and C6 levels. At least moderate C5-6 canal stenosis. CTA head: 1. Right M2 superior division proximal occlusion with poor downstream collateralization. 2. New calcification within right P 4 parieto-occipital origin, possibly atherosclerotic or nonocclusive calcified plaque thrombus. 3. Intracranial atherosclerosis with multiple areas of mild-to-moderate stenosis. No high-grade stenosis, aneurysm, or additional large vessel occlusion. These results were communicated to Dr. Lorraine Lax at [facility] 1/28/2019by text page via the [facility] messaging system. Electronically Signed   By: Kristine Garbe M.D.   On: 07/11/2017 19:05   Ct Head Wo Contrast  Result Date: 07/12/2017 CLINICAL DATA:  24 hour follow-up status post tPA. EXAM: CT HEAD WITHOUT CONTRAST TECHNIQUE: Contiguous axial images were obtained from the base of the skull through the vertex without intravenous contrast. COMPARISON:  Previous MRI from earlier the same day. FINDINGS: Brain: Patchy multifocal ischemic infarcts involving the right MCA and ACA territories as well as the watershed distribution again see, stable from previous MRI. No evidence for hemorrhagic transformation or other complication status post tPA. Otherwise stable appearance of the brain. Small remote left frontal infarct again noted. Remote left cerebellar infarct. Stable atrophy with chronic small vessel ischemic disease. No other acute intracranial hemorrhage or large vessel territory infarct. No mass lesion, midline  shift or mass effect. No hydrocephalus. No extra-axial fluid collection. Vascular: No hyperdense vessel. Scattered vascular calcifications noted within the carotid siphons. Skull: Scalp soft tissues and calvarium within normal limits. Sinuses/Orbits: Globes and orbital soft tissues within normal limits. Air-fluid levels noted within the sphenoid and maxillary sinuses. Scattered mucosal thickening within the ethmoidal sinuses. Mastoids are clear. Other: None. IMPRESSION: 1. Continued interval evolution right MCA and ACA territory infarcts, stable in distribution as compared to recent MRI. No evidence for hemorrhagic transformation or other complication identified. 2. Otherwise stable appearance of the brain. No other acute intracranial abnormality. 3. Acute maxillary and sphenoid sinusitis. Electronically Signed   By: Jeannine Boga M.D.   On: 07/12/2017 19:27   Ct Head Wo Contrast  Result Date: 07/11/2017 CLINICAL DATA:  84 y/o M; post intra-arterial intervention for stroke. EXAM: CT HEAD WITHOUT CONTRAST TECHNIQUE: Contiguous axial images were obtained from the base of the skull through the vertex without intravenous contrast. COMPARISON:  07/11/2017 CT  head and CT angiogram head. FINDINGS: Brain: Interval loss of gray-white differentiation in the right perisylvian frontal and parietal lobes (series 3, image 26 and series 6, image 21) likely representing developing acute infarction. No hemorrhage. Stable small chronic cortical infarcts in the frontal and right parietal lobes, small chronic infarct in left inferior cerebellar hemisphere, mild chronic microvascular ischemic changes, and parenchymal volume loss of the brain. Vascular: Contrast is present within the vascular structures. Skull: Normal. Negative for fracture or focal lesion. Sinuses/Orbits: Persistent partial opacification of left anterior ethmoid and frontal sinuses. Fluid level in the right maxillary sinus. Normal aeration of mastoid air  cells. Other: None. IMPRESSION: 1. Interval loss of gray-white differentiation in the right perisylvian frontal and parietal lobes likely representing a developing acute infarction. No hemorrhage. 2. Stable background of chronic microvascular ischemic changes and parenchymal volume loss of the brain. Electronically Signed   By: Kristine Garbe M.D.   On: 07/11/2017 21:50   Ct Angio Neck W And/or Wo Contrast  Result Date: 07/11/2017 CLINICAL DATA:  84 y/o  M; code stroke with left-sided deficits. EXAM: CT ANGIOGRAPHY HEAD AND NECK TECHNIQUE: Multidetector CT imaging of the head and neck was performed using the standard protocol during bolus administration of intravenous contrast. Multiplanar CT image reconstructions and MIPs were obtained to evaluate the vascular anatomy. Carotid stenosis measurements (when applicable) are obtained utilizing NASCET criteria, using the distal internal carotid diameter as the denominator. CONTRAST:  72mL ISOVUE-370 IOPAMIDOL (ISOVUE-370) INJECTION 76% COMPARISON:  07/11/2016 CT head.  05/15/2011 MRA head. FINDINGS: CTA NECK FINDINGS Aortic arch: Standard branching. Imaged portion shows no evidence of aneurysm or dissection. No significant stenosis of the major arch vessel origins. Moderate calcific atherosclerosis. Status post LIMA and saphenous CABG grafts. Right carotid system: No evidence of dissection, stenosis (50% or greater) or occlusion. Dense calcified plaque of the carotid bifurcation with mild less than 50% distal common carotid and internal carotid artery stenosis. Left carotid system: No evidence of dissection, stenosis (50% or greater) or occlusion. Dense calcified plaque of carotid bifurcation with mild less than 50% proximal ICA stenosis. Calcified plaque of left mid common carotid artery with minimal less than 30% stenosis. Vertebral arteries: Codominant. No evidence of dissection, stenosis (50% or greater) or occlusion. Right V1 calcified plaque with mild  less than 50% stenosis. Skeleton: Severe cervical spondylosis with extensive discogenic degenerative changes greatest at the C5-C7 levels and severe facet hypertrophy. Reversal of cervical curvature with apex at C5. Multiple levels of canal stenosis greatest at the C5-6 level where it is moderate to severe. Uncovertebral and facet hypertrophy narrows the neural foramen greatest at the bilateral C5-6 and C6-7 levels. Other neck: Negative. Upper chest: Negative. Review of the MIP images confirms the above findings CTA HEAD FINDINGS Anterior circulation: Right M2 superior division proximal occlusion (series 7, image 90). Poor collateralization in the right M2 superior division distribution. Left distal M1 short segment of moderate stenosis. Bilateral carotid siphon calcific plaque with moderate bilateral paraclinoid stenosis. Posterior circulation: New focal calcification within the right P4 parietooccipital branch origin (series 8, image 116) with normal downstream opacification of the artery, possibly atherosclerotic or nonocclusive calcified plaque thrombus. Bilateral P1 irregularity with mild stenosis. Venous sinuses: As permitted by contrast timing, patent. Anatomic variants: Anterior communicating artery. No posterior communicating artery identified, likely hypoplastic or absent. Delayed phase: No abnormal intracranial enhancement. Review of the MIP images confirms the above findings IMPRESSION: CTA neck: 1. No dissection, aneurysm, or high-grade stenosis by NASCET criteria. 2.  Calcified plaque of aorta and carotid bifurcations with mild less than 50% proximal ICA stenosis bilaterally. 3. Severe cervical spondylosis greatest at C5 and C6 levels. At least moderate C5-6 canal stenosis. CTA head: 1. Right M2 superior division proximal occlusion with poor downstream collateralization. 2. New calcification within right P 4 parieto-occipital origin, possibly atherosclerotic or nonocclusive calcified plaque thrombus. 3.  Intracranial atherosclerosis with multiple areas of mild-to-moderate stenosis. No high-grade stenosis, aneurysm, or additional large vessel occlusion. These results were communicated to Dr. Lorraine Lax at [place] 1/28/2019by text page via the [facility] messaging system. Electronically Signed   By: Kristine Garbe M.D.   On: 07/11/2017 19:05   Mr Jodene Nam Head Wo Contrast  Result Date: 07/12/2017 CLINICAL DATA:  LEFT-sided weakness, RIGHT gaze deficit. Status post tPA and complete revascularization of RIGHT ACA occlusion. EXAM: MRI HEAD WITHOUT CONTRAST MRA HEAD WITHOUT CONTRAST TECHNIQUE: Multiplanar, multiecho pulse sequences of the brain and surrounding structures were obtained without intravenous contrast. Angiographic images of the head were obtained using MRA technique without contrast. COMPARISON:  CT HEAD and CTA HEAD and neck July 11, 2016. MRI of the head May 15, 2011 FINDINGS: MRI HEAD FINDINGS INTRACRANIAL CONTENTS: Confluent RIGHT frontoparietal reduced diffusion including medial cortex with low ADC values. Discontinuous RIGHT frontal and RIGHT basal ganglia reduced diffusion and low ADC values. Subcentimeter reduced diffusion and low ADC values RIGHT posterior temporal lobe. Faint susceptibility artifact RIGHT temporoparietal lobes compatible with petechial hemorrhage. Scattered chronic micro hemorrhages. LEFT cerebellar encephalomalacia. Small area RIGHT temporal occipital and LEFT frontal encephalomalacia. Old small RIGHT cerebellar, RIGHT pontine, RIGHT thalamus and RIGHT basal ganglia to corona radiata infarct. Prominent basal ganglia perivascular spaces associated with chronic small vessel ischemic disease. A few additional subcentimeter supratentorial white matter FLAIR T2 hyperintensities compatible with mild chronic small vessel ischemic disease, less than expected for age. No advanced parenchymal brain volume loss for age. No midline shift, mass effect or masses. No abnormal extra-axial  fluid collections. VASCULAR: Normal major intracranial vascular flow voids present at skull base. SKULL AND UPPER CERVICAL SPINE: No abnormal sellar expansion. No suspicious calvarial bone marrow signal. 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 Craniocervical junction maintained. SINUSES/ORBITS: Pan paranasal sinusitis with large RIGHT maxillary sinus air-fluid level. Trace RIGHT mastoid effusion. Included ocular globes and orbital contents are non-suspicious. OTHER: None. MRA HEAD FINDINGS ANTERIOR CIRCULATION: Normal flow related enhancement of the included cervical, petrous, cavernous and supraclinoid internal carotid arteries. Moderate stenosis bilateral para clinoid internal carotid arteries. Patent anterior communicating artery. Patent anterior and middle cerebral arteries, bilateral anterior cerebral arteries distal segments on included on MRA. No large vessel occlusion, flow limiting stenosis, aneurysm. POSTERIOR CIRCULATION: Codominant vertebral arteries. Patent vertebrobasilar junction and basilar artery. Main branch vessels not well demonstrated. Severe stenosis RIGHT P1 segment, moderate LEFT P2 stenosis. RIGHT P3 occlusion with thready reconstitution. No  aneurysm. ANATOMIC VARIANTS: None. Source images and MIP images were reviewed. IMPRESSION: MRI head: 1. Acute moderate RIGHT MCA and small RIGHT distal ACA infarcts. Small RIGHT posterior watershed territory infarct. Minimal petechial hemorrhage without hemorrhagic conversion. 2. 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 Multiple small vessel supra-and infratentorial old infarcts. MRA head: 1. Successful revascularization of RIGHT middle cerebral artery occlusion. Distal anterior cerebral artery's not included on typical MRA head. 2. Focally occluded RIGHT P3 segment (possibly overestimated by calcification and resultant  artifact) with immediate reconstitution. Severe stenosis RIGHT P1 segment. Electronically Signed   By:  Elon Alas M.D.   On: 07/12/2017 02:04   Mr Brain Wo Contrast  Result Date: 07/12/2017 CLINICAL DATA:  LEFT-sided weakness, RIGHT gaze deficit. Status post tPA and complete revascularization of RIGHT ACA occlusion. EXAM: MRI HEAD WITHOUT CONTRAST MRA HEAD WITHOUT CONTRAST TECHNIQUE: Multiplanar, multiecho pulse sequences of the brain and surrounding structures were obtained without intravenous contrast. Angiographic images of the head were obtained using MRA technique without contrast. COMPARISON:  CT HEAD and CTA HEAD and neck July 11, 2016. MRI of the head May 15, 2011 FINDINGS: MRI HEAD FINDINGS INTRACRANIAL CONTENTS: Confluent RIGHT frontoparietal reduced diffusion including medial cortex with low ADC values. Discontinuous RIGHT frontal and RIGHT basal ganglia reduced diffusion and low ADC values. Subcentimeter reduced diffusion and low ADC values RIGHT posterior temporal lobe. Faint susceptibility artifact RIGHT temporoparietal lobes compatible with petechial hemorrhage. Scattered chronic micro hemorrhages. LEFT cerebellar encephalomalacia. Small area RIGHT temporal occipital and LEFT frontal encephalomalacia. Old small RIGHT cerebellar, RIGHT pontine, RIGHT thalamus and RIGHT basal ganglia to corona radiata infarct. Prominent basal ganglia perivascular spaces associated with chronic small vessel ischemic disease. A few additional subcentimeter supratentorial white matter FLAIR T2 hyperintensities compatible with mild chronic small vessel ischemic disease, less than expected for age. No advanced parenchymal brain volume loss for age. No midline shift, mass effect or masses. No abnormal extra-axial fluid collections. VASCULAR: Normal major intracranial vascular flow voids present at skull base. SKULL AND UPPER CERVICAL SPINE: No abnormal sellar expansion. No suspicious calvarial bone marrow  signal. 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 Craniocervical junction maintained. SINUSES/ORBITS: Pan paranasal sinusitis with large RIGHT maxillary sinus air-fluid level. Trace RIGHT mastoid effusion. Included ocular globes and orbital contents are non-suspicious. OTHER: None. MRA HEAD FINDINGS ANTERIOR CIRCULATION: Normal flow related enhancement of the included cervical, petrous, cavernous and supraclinoid internal carotid arteries. Moderate stenosis bilateral para clinoid internal carotid arteries. Patent anterior communicating artery. Patent anterior and middle cerebral arteries, bilateral anterior cerebral arteries distal segments on included on MRA. No large vessel occlusion, flow limiting stenosis, aneurysm. POSTERIOR CIRCULATION: Codominant vertebral arteries. Patent vertebrobasilar junction and basilar artery. Main branch vessels not well demonstrated. Severe stenosis RIGHT P1 segment, moderate LEFT P2 stenosis. RIGHT P3 occlusion with thready reconstitution. No  aneurysm. ANATOMIC VARIANTS: None. Source images and MIP images were reviewed. IMPRESSION: MRI head: 1. Acute moderate RIGHT MCA and small RIGHT distal ACA infarcts. Small RIGHT posterior watershed territory infarct. Minimal petechial hemorrhage without hemorrhagic conversion. 2. 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 Multiple small vessel supra-and infratentorial old infarcts. MRA head: 1. Successful revascularization of RIGHT middle cerebral artery occlusion. Distal anterior cerebral artery's not included on typical MRA head. 2. Focally occluded RIGHT P3 segment (possibly overestimated by calcification and resultant artifact) with immediate reconstitution. Severe stenosis RIGHT P1 segment. Electronically Signed   By: Elon Alas M.D.   On: 07/12/2017 02:04   Portable Chest Xray  Result Date:  07/12/2017 CLINICAL DATA:  84 year old male status post right MCA M2 vessel occlusion treated with tPA and revascularization. Intubated. EXAM: PORTABLE CHEST 1 VIEW COMPARISON:  07/11/2017 and earlier. FINDINGS: Portable AP semi upright view at 0617 hours. Stable endotracheal tube tip just below the clavicles. Stable lung volumes. Mediastinal contours remain within normal limits. Calcified aortic atherosclerosis. Prior CABG. Streaky right mid and lower lung opacity with blunting of the right costophrenic angle appears chronic and not significantly changed compared to 2016. No pneumothorax, pulmonary edema  or acute pulmonary opacity. IMPRESSION: 1. Stable endotracheal tube. 2. Chronic opacity in the right lower lung. No acute cardiopulmonary abnormality. Electronically Signed   By: Genevie Ann M.D.   On: 07/12/2017 08:40   Portable Chest X-ray  Result Date: 07/11/2017 CLINICAL DATA:  Respiratory failure EXAM: PORTABLE CHEST 1 VIEW COMPARISON:  Chest x-ray dated 01/13/2015. FINDINGS: Heart size and mediastinal contours are stable in size and configuration. Aortic atherosclerosis. Median sternotomy wires appear intact. Endotracheal tube is adequately positioned with tip approximately 5 cm above the carina. Chronic small pleural effusion and/or pleural thickening at the right lung base. No new lung findings. No pneumothorax seen. IMPRESSION: 1. No active disease.  No evidence of pneumonia or pulmonary edema. 2. Chronic small right pleural effusion and/or pleural thickening, stable. 3. Aortic atherosclerosis. Electronically Signed   By: Franki Cabot M.D.   On: 07/11/2017 22:27   Ct Head Code Stroke Wo Contrast  Result Date: 07/11/2017 CLINICAL DATA:  Code stroke. 84 y/o M; left-sided deficits and right-sided gaze. EXAM: CT HEAD WITHOUT CONTRAST TECHNIQUE: Contiguous axial images were obtained from the base of the skull through the vertex without intravenous contrast. COMPARISON:  05/13/2011 CT head. FINDINGS: Brain:  Stable small chronic cortical infarcts in left frontal and right parietal lobes. Stable small chronic lacunar infarct in the right caudate body and right thalamus. Chronic infarction in the left inferior cerebellar hemisphere. Background of mild chronic microvascular ischemic changes and parenchymal volume loss of the brain. Vascular: Calcific atherosclerosis of carotid siphons. Hyperdensity within right distal M1 (series 6, image 21). Skull: Normal. Negative for fracture or focal lesion. Sinuses/Orbits: Left anterior ethmoid and frontal sinus partial opacification in chronic inflammatory changes. Right intra-ocular lens replacement. Other: None. ASPECTS [facility] Stroke Program Early CT Score) - Ganglionic level infarction (caudate, lentiform nuclei, internal capsule, insula, M1-M3 cortex): 7 - Supraganglionic infarction (M4-M6 cortex): 3 Total score (0-10 with 10 being normal): 10 IMPRESSION: 1. No acute intracranial abnormality identified. 2. ASPECTS is 10 3. Dense distal right M1, suspected thrombus. 4. Stable chronic microvascular ischemic changes, parenchymal volume loss, and small cortical infarctions. These results were called by telephone at the time of interpretation on 07/11/2017 at 6:43 pm to Dr. Lorraine Lax, who verbally acknowledged these results. Electronically Signed   By: Kristine Garbe M.D.   On: 07/11/2017 18:43    Assessment/Plan: Diagnosis: RIght MCA infarct with left hemi, s/p successful revascularization 1. Does the need for close, 24 hr/day medical supervision in concert with the patient's rehab needs make it unreasonable for this patient to be served in a less intensive setting? Yes 2. Co-Morbidities requiring supervision/potential complications: atrial fibrillation 3. Due to bladder management, bowel management, safety, skin/wound care, disease management and medication administration, does the patient require 24 hr/day rehab nursing? Yes 4. Does the patient require coordinated  care of a physician, rehab nurse, PT (1-2 hrs/day, 5 days/week), OT (1-2 hrs/day, 5 days/week) and SLP (.5-1 hrs/day, 5 days/week) to address physical and functional deficits in the context of the above medical diagnosis(es)? Yes Addressing deficits in the following areas: balance, endurance, locomotion, strength, transferring, bowel/bladder control, bathing, dressing, feeding, grooming, toileting, cognition, swallowing and psychosocial support 5. Can the patient actively participate in an intensive therapy program of at least 3 hrs of therapy per day at least 5 days per week? Yes 6. The potential for patient to make measurable gains while on inpatient rehab is fair 7. Anticipated functional outcomes upon discharge from inpatient rehab are mod assist  with PT,  mod assist with OT, supervision with SLP. 8. Estimated rehab length of stay to reach the above functional goals is: 21-23d 9. Anticipated D/C setting: Home vs SNF dependng on caregiver 10. Anticipated post D/C treatments: [place] therapy 11. Overall Rehab/Functional Prognosis: fair  RECOMMENDATIONS: This patient's condition is appropriate for continued rehabilitative care in the following setting: CIR Patient has agreed to participate in recommended program. Yes Note that insurance prior authorization may be required for reimbursement for recommended care.  Comment: Given that goals are for moderate assistance ADL and mobility, need to make sure that patient has SNF coverage in case Family cannot provide this level of assistance  Charlett Blake M.D. [employer] FAAPM&R (Sports Med, Neuromuscular Med) Diplomate [place] Board of [place], PA-C 07/13/2017

## 2017-07-12 NOTE — Progress Notes (Signed)
PULMONARY / CRITICAL CARE MEDICINE   Name: Arthur [name] MRN: [id number] DOB: Apr 19, 1929    ADMISSION DATE:  07/11/2017 CONSULTATION DATE:  07/11/17  REFERRING MD:  Aroor  CHIEF COMPLAINT:  Left sided weakness  HISTORY OF PRESENT ILLNESS:  Pt is encephelopathic; therefore, this HPI is obtained from chart review. Arthur [name] is a 84 y.o. 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  He presented to [facility] ED 07/11/17 with left sided weakness and right gaze deviation.  CT was negative for hemorrhage.  CTA demonstrated concern for CVA and he was ruled in for tPA which he received.  He then was taken to IR where he had complete revascularization of right ACA with intraarterial tPA administered.  He returned to the ICU on the ventilator and PCCM was asked to assist with vent management.   SUBJECTIVE:  Following commands on right, withdraws on R but no purposeful movement noted,Right eye open, L eye with droop  VITAL SIGNS: BP (!) 154/74   Pulse 73   Temp 98.6 F (37 C) (Axillary)   Resp 16   Ht 5\' 8"  (1.727 m)   Wt 176 lb 12.9 oz (80.2 kg)   SpO2 99%   BMI 26.88 kg/m   HEMODYNAMICS:    VENTILATOR SETTINGS: Vent Mode: PSV FiO2 (%):  [40 %-100 %] 40 % Set Rate:  [18 bmp] 18 bmp Vt Set:  [550 mL] 550 mL PEEP:  [5 cmH20] 5 cmH20 Pressure Support:  [5 cmH20] 5 cmH20 Plateau Pressure:  [15 cmH20] 15 cmH20  INTAKE / OUTPUT: I/O last 3 completed shifts: In: 943.3 [I.V.:943.3] Out: 1150 [Urine:1050; Blood:100]   PHYSICAL EXAMINATION: General: Elderly male, intubated on CPAP/ PS, following commands on R Neuro:  Sedation off, following commands on R, WD on L with no purposeful movement. HEENT: [place]/AT. PERRL, sclerae anicteric. ETT in place. Cardiovascular: IRIR, no M/R/G., S1, S2 noted  Lungs: Respirations even and unlabored, Bilateral excursion noted  Coarse  bilaterally. Abdomen: BS x 4, soft, NT/ND.  Musculoskeletal: No gross deformities, no edema.  Skin:  Some dried blood to right hand and scattered superficial ecchymosis.  Skin otherwise intact, warm, no rashes, no lesions.  LABS:  BMET Recent Labs  Lab 07/11/17 1820 07/11/17 1826 07/12/17 0359  NA 138 139 138  K 4.6 4.6 4.1  CL 107 105 110  CO2 21*  --  19*  BUN 21* 21* 22*  CREATININE 1.29* 1.20 1.17  GLUCOSE 111* 107* 138*    Electrolytes Recent Labs  Lab 07/11/17 1820 07/12/17 0359  CALCIUM 9.2 8.3*    CBC Recent Labs  Lab 07/11/17 1820 07/11/17 1826 07/12/17 0359  WBC 7.2  --  8.4  HGB 14.4 15.0 12.7*  HCT 44.1 44.0 38.5*  PLT 219  --  196    Coag's Recent Labs  Lab 07/11/17 1820  APTT 31  INR 1.48    Sepsis Markers No results for input(s): LATICACIDVEN, PROCALCITON, O2SATVEN in the last 168 hours.  ABG Recent Labs  Lab 07/11/17 2155  PHART 7.374  PCO2ART 35.7  PO2ART 318*    Liver Enzymes Recent Labs  Lab 07/11/17 1820  AST 25  ALT 14*  ALKPHOS 57  BILITOT 0.9  ALBUMIN 4.3    Cardiac Enzymes No results for input(s): TROPONINI, PROBNP in the last 168 hours.  Glucose Recent Labs  Lab 07/11/17 1819  GLUCAP 90  Imaging Ct Angio Head W Or Wo Contrast  Result Date: 07/11/2017 CLINICAL DATA:  84 y/o  M; code stroke with left-sided deficits. EXAM: CT ANGIOGRAPHY HEAD AND NECK TECHNIQUE: Multidetector CT imaging of the head and neck was performed using the standard protocol during bolus administration of intravenous contrast. Multiplanar CT image reconstructions and MIPs were obtained to evaluate the vascular anatomy. Carotid stenosis measurements (when applicable) are obtained utilizing NASCET criteria, using the distal internal carotid diameter as the denominator. CONTRAST:  63mL ISOVUE-370 IOPAMIDOL (ISOVUE-370) INJECTION 76% COMPARISON:  07/11/2016 CT head.  05/15/2011 MRA head. FINDINGS: CTA NECK FINDINGS Aortic arch: Standard  branching. Imaged portion shows no evidence of aneurysm or dissection. No significant stenosis of the major arch vessel origins. Moderate calcific atherosclerosis. Status post LIMA and saphenous CABG grafts. Right carotid system: No evidence of dissection, stenosis (50% or greater) or occlusion. Dense calcified plaque of the carotid bifurcation with mild less than 50% distal common carotid and internal carotid artery stenosis. Left carotid system: No evidence of dissection, stenosis (50% or greater) or occlusion. Dense calcified plaque of carotid bifurcation with mild less than 50% proximal ICA stenosis. Calcified plaque of left mid common carotid artery with minimal less than 30% stenosis. Vertebral arteries: Codominant. No evidence of dissection, stenosis (50% or greater) or occlusion. Right V1 calcified plaque with mild less than 50% stenosis. Skeleton: Severe cervical spondylosis with extensive discogenic degenerative changes greatest at the C5-C7 levels and severe facet hypertrophy. Reversal of cervical curvature with apex at C5. Multiple levels of canal stenosis greatest at the C5-6 level where it is moderate to severe. Uncovertebral and facet hypertrophy narrows the neural foramen greatest at the bilateral C5-6 and C6-7 levels. Other neck: Negative. Upper chest: Negative. Review of the MIP images confirms the above findings CTA HEAD FINDINGS Anterior circulation: Right M2 superior division proximal occlusion (series 7, image 90). Poor collateralization in the right M2 superior division distribution. Left distal M1 short segment of moderate stenosis. Bilateral carotid siphon calcific plaque with moderate bilateral paraclinoid stenosis. Posterior circulation: New focal calcification within the right P4 parietooccipital branch origin (series 8, image 116) with normal downstream opacification of the artery, possibly atherosclerotic or nonocclusive calcified plaque thrombus. Bilateral P1 irregularity with mild  stenosis. Venous sinuses: As permitted by contrast timing, patent. Anatomic variants: Anterior communicating artery. No posterior communicating artery identified, likely hypoplastic or absent. Delayed phase: No abnormal intracranial enhancement. Review of the MIP images confirms the above findings IMPRESSION: CTA neck: 1. No dissection, aneurysm, or high-grade stenosis by NASCET criteria. 2. Calcified plaque of aorta and carotid bifurcations with mild less than 50% proximal ICA stenosis bilaterally. 3. Severe cervical spondylosis greatest at C5 and C6 levels. At least moderate C5-6 canal stenosis. CTA head: 1. Right M2 superior division proximal occlusion with poor downstream collateralization. 2. New calcification within right P 4 parieto-occipital origin, possibly atherosclerotic or nonocclusive calcified plaque thrombus. 3. Intracranial atherosclerosis with multiple areas of mild-to-moderate stenosis. No high-grade stenosis, aneurysm, or additional large vessel occlusion. These results were communicated to Dr. Lorraine Lax at [place] 1/28/2019by text page via the [facility] messaging system. Electronically Signed   By: Kristine Garbe M.D.   On: 07/11/2017 19:05   Ct Head Wo Contrast  Result Date: 07/11/2017 CLINICAL DATA:  84 y/o M; post intra-arterial intervention for stroke. EXAM: CT HEAD WITHOUT CONTRAST TECHNIQUE: Contiguous axial images were obtained from the base of the skull through the vertex without intravenous contrast. COMPARISON:  07/11/2017 CT head and CT angiogram  head. FINDINGS: Brain: Interval loss of gray-white differentiation in the right perisylvian frontal and parietal lobes (series 3, image 26 and series 6, image 21) likely representing developing acute infarction. No hemorrhage. Stable small chronic cortical infarcts in the frontal and right parietal lobes, small chronic infarct in left inferior cerebellar hemisphere, mild chronic microvascular ischemic changes, and parenchymal volume  loss of the brain. Vascular: Contrast is present within the vascular structures. Skull: Normal. Negative for fracture or focal lesion. Sinuses/Orbits: Persistent partial opacification of left anterior ethmoid and frontal sinuses. Fluid level in the right maxillary sinus. Normal aeration of mastoid air cells. Other: None. IMPRESSION: 1. Interval loss of gray-white differentiation in the right perisylvian frontal and parietal lobes likely representing a developing acute infarction. No hemorrhage. 2. Stable background of chronic microvascular ischemic changes and parenchymal volume loss of the brain. Electronically Signed   By: Kristine Garbe M.D.   On: 07/11/2017 21:50   Ct Angio Neck W And/or Wo Contrast  Result Date: 07/11/2017 CLINICAL DATA:  84 y/o  M; code stroke with left-sided deficits. EXAM: CT ANGIOGRAPHY HEAD AND NECK TECHNIQUE: Multidetector CT imaging of the head and neck was performed using the standard protocol during bolus administration of intravenous contrast. Multiplanar CT image reconstructions and MIPs were obtained to evaluate the vascular anatomy. Carotid stenosis measurements (when applicable) are obtained utilizing NASCET criteria, using the distal internal carotid diameter as the denominator. CONTRAST:  8mL ISOVUE-370 IOPAMIDOL (ISOVUE-370) INJECTION 76% COMPARISON:  07/11/2016 CT head.  05/15/2011 MRA head. FINDINGS: CTA NECK FINDINGS Aortic arch: Standard branching. Imaged portion shows no evidence of aneurysm or dissection. No significant stenosis of the major arch vessel origins. Moderate calcific atherosclerosis. Status post LIMA and saphenous CABG grafts. Right carotid system: No evidence of dissection, stenosis (50% or greater) or occlusion. Dense calcified plaque of the carotid bifurcation with mild less than 50% distal common carotid and internal carotid artery stenosis. Left carotid system: No evidence of dissection, stenosis (50% or greater) or occlusion. Dense  calcified plaque of carotid bifurcation with mild less than 50% proximal ICA stenosis. Calcified plaque of left mid common carotid artery with minimal less than 30% stenosis. Vertebral arteries: Codominant. No evidence of dissection, stenosis (50% or greater) or occlusion. Right V1 calcified plaque with mild less than 50% stenosis. Skeleton: Severe cervical spondylosis with extensive discogenic degenerative changes greatest at the C5-C7 levels and severe facet hypertrophy. Reversal of cervical curvature with apex at C5. Multiple levels of canal stenosis greatest at the C5-6 level where it is moderate to severe. Uncovertebral and facet hypertrophy narrows the neural foramen greatest at the bilateral C5-6 and C6-7 levels. Other neck: Negative. Upper chest: Negative. Review of the MIP images confirms the above findings CTA HEAD FINDINGS Anterior circulation: Right M2 superior division proximal occlusion (series 7, image 90). Poor collateralization in the right M2 superior division distribution. Left distal M1 short segment of moderate stenosis. Bilateral carotid siphon calcific plaque with moderate bilateral paraclinoid stenosis. Posterior circulation: New focal calcification within the right P4 parietooccipital branch origin (series 8, image 116) with normal downstream opacification of the artery, possibly atherosclerotic or nonocclusive calcified plaque thrombus. Bilateral P1 irregularity with mild stenosis. Venous sinuses: As permitted by contrast timing, patent. Anatomic variants: Anterior communicating artery. No posterior communicating artery identified, likely hypoplastic or absent. Delayed phase: No abnormal intracranial enhancement. Review of the MIP images confirms the above findings IMPRESSION: CTA neck: 1. No dissection, aneurysm, or high-grade stenosis by NASCET criteria. 2. Calcified plaque of aorta  and carotid bifurcations with mild less than 50% proximal ICA stenosis bilaterally. 3. Severe cervical  spondylosis greatest at C5 and C6 levels. At least moderate C5-6 canal stenosis. CTA head: 1. Right M2 superior division proximal occlusion with poor downstream collateralization. 2. New calcification within right P 4 parieto-occipital origin, possibly atherosclerotic or nonocclusive calcified plaque thrombus. 3. Intracranial atherosclerosis with multiple areas of mild-to-moderate stenosis. No high-grade stenosis, aneurysm, or additional large vessel occlusion. These results were communicated to Dr. Lorraine Lax at [place] 1/28/2019by text page via the [facility] messaging system. Electronically Signed   By: Kristine Garbe M.D.   On: 07/11/2017 19:05   Mr Jodene Nam Head Wo Contrast  Result Date: 07/12/2017 CLINICAL DATA:  LEFT-sided weakness, RIGHT gaze deficit. Status post tPA and complete revascularization of RIGHT ACA occlusion. EXAM: MRI HEAD WITHOUT CONTRAST MRA HEAD WITHOUT CONTRAST TECHNIQUE: Multiplanar, multiecho pulse sequences of the brain and surrounding structures were obtained without intravenous contrast. Angiographic images of the head were obtained using MRA technique without contrast. COMPARISON:  CT HEAD and CTA HEAD and neck July 11, 2016. MRI of the head May 15, 2011 FINDINGS: MRI HEAD FINDINGS INTRACRANIAL CONTENTS: Confluent RIGHT frontoparietal reduced diffusion including medial cortex with low ADC values. Discontinuous RIGHT frontal and RIGHT basal ganglia reduced diffusion and low ADC values. Subcentimeter reduced diffusion and low ADC values RIGHT posterior temporal lobe. Faint susceptibility artifact RIGHT temporoparietal lobes compatible with petechial hemorrhage. Scattered chronic micro hemorrhages. LEFT cerebellar encephalomalacia. Small area RIGHT temporal occipital and LEFT frontal encephalomalacia. Old small RIGHT cerebellar, RIGHT pontine, RIGHT thalamus and RIGHT basal ganglia to corona radiata infarct. Prominent basal ganglia perivascular spaces associated with chronic small  vessel ischemic disease. A few additional subcentimeter supratentorial white matter FLAIR T2 hyperintensities compatible with mild chronic small vessel ischemic disease, less than expected for age. No advanced parenchymal brain volume loss for age. No midline shift, mass effect or masses. No abnormal extra-axial fluid collections. VASCULAR: Normal major intracranial vascular flow voids present at skull base. SKULL AND UPPER CERVICAL SPINE: No abnormal sellar expansion. No suspicious calvarial bone marrow signal. 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 Craniocervical junction maintained. SINUSES/ORBITS: Pan paranasal sinusitis with large RIGHT maxillary sinus air-fluid level. Trace RIGHT mastoid effusion. Included ocular globes and orbital contents are non-suspicious. OTHER: None. MRA HEAD FINDINGS ANTERIOR CIRCULATION: Normal flow related enhancement of the included cervical, petrous, cavernous and supraclinoid internal carotid arteries. Moderate stenosis bilateral para clinoid internal carotid arteries. Patent anterior communicating artery. Patent anterior and middle cerebral arteries, bilateral anterior cerebral arteries distal segments on included on MRA. No large vessel occlusion, flow limiting stenosis, aneurysm. POSTERIOR CIRCULATION: Codominant vertebral arteries. Patent vertebrobasilar junction and basilar artery. Main branch vessels not well demonstrated. Severe stenosis RIGHT P1 segment, moderate LEFT P2 stenosis. RIGHT P3 occlusion with thready reconstitution. No  aneurysm. ANATOMIC VARIANTS: None. Source images and MIP images were reviewed. IMPRESSION: MRI head: 1. Acute moderate RIGHT MCA and small RIGHT distal ACA infarcts. Small RIGHT posterior watershed territory infarct. Minimal petechial hemorrhage without hemorrhagic conversion. 2. 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  Multiple small vessel supra-and infratentorial old infarcts. MRA head: 1. Successful revascularization of RIGHT middle cerebral artery occlusion. Distal anterior cerebral artery's not included on typical MRA head. 2. Focally occluded RIGHT P3 segment (possibly overestimated by calcification and resultant artifact) with immediate reconstitution. Severe stenosis RIGHT P1 segment. Electronically Signed   By: Thana Farr.D.  On: 07/12/2017 02:04   Mr Brain Wo Contrast  Result Date: 07/12/2017 CLINICAL DATA:  LEFT-sided weakness, RIGHT gaze deficit. Status post tPA and complete revascularization of RIGHT ACA occlusion. EXAM: MRI HEAD WITHOUT CONTRAST MRA HEAD WITHOUT CONTRAST TECHNIQUE: Multiplanar, multiecho pulse sequences of the brain and surrounding structures were obtained without intravenous contrast. Angiographic images of the head were obtained using MRA technique without contrast. COMPARISON:  CT HEAD and CTA HEAD and neck July 11, 2016. MRI of the head May 15, 2011 FINDINGS: MRI HEAD FINDINGS INTRACRANIAL CONTENTS: Confluent RIGHT frontoparietal reduced diffusion including medial cortex with low ADC values. Discontinuous RIGHT frontal and RIGHT basal ganglia reduced diffusion and low ADC values. Subcentimeter reduced diffusion and low ADC values RIGHT posterior temporal lobe. Faint susceptibility artifact RIGHT temporoparietal lobes compatible with petechial hemorrhage. Scattered chronic micro hemorrhages. LEFT cerebellar encephalomalacia. Small area RIGHT temporal occipital and LEFT frontal encephalomalacia. Old small RIGHT cerebellar, RIGHT pontine, RIGHT thalamus and RIGHT basal ganglia to corona radiata infarct. Prominent basal ganglia perivascular spaces associated with chronic small vessel ischemic disease. A few additional subcentimeter supratentorial white matter FLAIR T2 hyperintensities compatible with mild chronic small vessel ischemic disease, less than expected for age. No  advanced parenchymal brain volume loss for age. No midline shift, mass effect or masses. No abnormal extra-axial fluid collections. VASCULAR: Normal major intracranial vascular flow voids present at skull base. SKULL AND UPPER CERVICAL SPINE: No abnormal sellar expansion. No suspicious calvarial bone marrow signal. 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 Craniocervical junction maintained. SINUSES/ORBITS: Pan paranasal sinusitis with large RIGHT maxillary sinus air-fluid level. Trace RIGHT mastoid effusion. Included ocular globes and orbital contents are non-suspicious. OTHER: None. MRA HEAD FINDINGS ANTERIOR CIRCULATION: Normal flow related enhancement of the included cervical, petrous, cavernous and supraclinoid internal carotid arteries. Moderate stenosis bilateral para clinoid internal carotid arteries. Patent anterior communicating artery. Patent anterior and middle cerebral arteries, bilateral anterior cerebral arteries distal segments on included on MRA. No large vessel occlusion, flow limiting stenosis, aneurysm. POSTERIOR CIRCULATION: Codominant vertebral arteries. Patent vertebrobasilar junction and basilar artery. Main branch vessels not well demonstrated. Severe stenosis RIGHT P1 segment, moderate LEFT P2 stenosis. RIGHT P3 occlusion with thready reconstitution. No  aneurysm. ANATOMIC VARIANTS: None. Source images and MIP images were reviewed. IMPRESSION: MRI head: 1. Acute moderate RIGHT MCA and small RIGHT distal ACA infarcts. Small RIGHT posterior watershed territory infarct. Minimal petechial hemorrhage without hemorrhagic conversion. 2. 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 Multiple small vessel supra-and infratentorial old infarcts. MRA head: 1. Successful revascularization of RIGHT middle cerebral artery occlusion. Distal anterior cerebral artery's not included on  typical MRA head. 2. Focally occluded RIGHT P3 segment (possibly overestimated by calcification and resultant artifact) with immediate reconstitution. Severe stenosis RIGHT P1 segment. Electronically Signed   By: Elon Alas M.D.   On: 07/12/2017 02:04   Portable Chest Xray  Result Date: 07/12/2017 CLINICAL DATA:  84 year old male status post right MCA M2 vessel occlusion treated with tPA and revascularization. Intubated. EXAM: PORTABLE CHEST 1 VIEW COMPARISON:  07/11/2017 and earlier. FINDINGS: Portable AP semi upright view at 0617 hours. Stable endotracheal tube tip just below the clavicles. Stable lung volumes. Mediastinal contours remain within normal limits. Calcified aortic atherosclerosis. Prior CABG. Streaky right mid and lower lung opacity with blunting of the right costophrenic angle appears chronic and not significantly changed compared to 2016. No pneumothorax, pulmonary edema or acute pulmonary opacity. IMPRESSION: 1.  Stable endotracheal tube. 2. Chronic opacity in the right lower lung. No acute cardiopulmonary abnormality. Electronically Signed   By: Genevie Ann M.D.   On: 07/12/2017 08:40   Portable Chest X-ray  Result Date: 07/11/2017 CLINICAL DATA:  Respiratory failure EXAM: PORTABLE CHEST 1 VIEW COMPARISON:  Chest x-ray dated 01/13/2015. FINDINGS: Heart size and mediastinal contours are stable in size and configuration. Aortic atherosclerosis. Median sternotomy wires appear intact. Endotracheal tube is adequately positioned with tip approximately 5 cm above the carina. Chronic small pleural effusion and/or pleural thickening at the right lung base. No new lung findings. No pneumothorax seen. IMPRESSION: 1. No active disease.  No evidence of pneumonia or pulmonary edema. 2. Chronic small right pleural effusion and/or pleural thickening, stable. 3. Aortic atherosclerosis. Electronically Signed   By: Franki Cabot M.D.   On: 07/11/2017 22:27   Ct Head Code Stroke Wo Contrast  Result  Date: 07/11/2017 CLINICAL DATA:  Code stroke. 84 y/o M; left-sided deficits and right-sided gaze. EXAM: CT HEAD WITHOUT CONTRAST TECHNIQUE: Contiguous axial images were obtained from the base of the skull through the vertex without intravenous contrast. COMPARISON:  05/13/2011 CT head. FINDINGS: Brain: Stable small chronic cortical infarcts in left frontal and right parietal lobes. Stable small chronic lacunar infarct in the right caudate body and right thalamus. Chronic infarction in the left inferior cerebellar hemisphere. Background of mild chronic microvascular ischemic changes and parenchymal volume loss of the brain. Vascular: Calcific atherosclerosis of carotid siphons. Hyperdensity within right distal M1 (series 6, image 21). Skull: Normal. Negative for fracture or focal lesion. Sinuses/Orbits: Left anterior ethmoid and frontal sinus partial opacification in chronic inflammatory changes. Right intra-ocular lens replacement. Other: None. ASPECTS [facility] Stroke Program Early CT Score) - Ganglionic level infarction (caudate, lentiform nuclei, internal capsule, insula, M1-M3 cortex): 7 - Supraganglionic infarction (M4-M6 cortex): 3 Total score (0-10 with 10 being normal): 10 IMPRESSION: 1. No acute intracranial abnormality identified. 2. ASPECTS is 10 3. Dense distal right M1, suspected thrombus. 4. Stable chronic microvascular ischemic changes, parenchymal volume loss, and small cortical infarctions. These results were called by telephone at the time of interpretation on 07/11/2017 at 6:43 pm to Dr. Lorraine Lax, who verbally acknowledged these results. Electronically Signed   By: Kristine Garbe M.D.   On: 07/11/2017 18:43    STUDIES:  CT head 1/28 > no hemorrhage, dense distal right M1. CTA head and neck 1/28 > right M2 occlusion, new calcification in right parieto-occipital region. CT head 1/28 >  Echo 1/28 >  MRI brain 1/29 >  CULTURES: None.  ANTIBIOTICS: None.  SIGNIFICANT EVENTS: 1/28  > admitted with CVA > received systemic tPA and also taken to IR where he had complete revascularization of right ACA with intraarterial tPA administered.  LINES/TUBES: ETT 1/28 >   DISCUSSION: 84 y.o. male admitted with concern for CVA, received systemic tPA as well as intrarterial tPA followed by complete revascularization of occluded right ACA.  ASSESSMENT / PLAN:  PULMONARY A: Respiratory insufficiency - s/p intubation for IR procedure. Tolerating CPAP/ PS  Consider extubation P:   CXR today and PRN SBT with CPAP/ PS Wean FiO2 and PS as able VAP prevention measures.   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 Echo EF 55-60% P:  Tele Monitor hemodynamics. BP goals per neuro. Continue preadmission statin. Hold preadmission coumadin, amlodipine, benazepril, carvedilol, nitro.  RENAL A:   No acute issues. P:   NS @  75. Trend BMP and Urine output Replete electrolytes as needed  GASTROINTESTINAL A:   GERD. Nutrition. P:   SUP: Pantoprazole. NPO. TF if not extubated 1/29 SLP eval once extubated.  HEMATOLOGIC A:   VTE Prophylaxis. HGB Platelets stable  P:  SCD's. Trend CBC Monitor for active bleeding Transfuse per ICU protocol.  INFECTIOUS A:   No indication of infection. T Max 98.3 P:   Monitor Fever curve and WBC Culture as is clinically indicated   ENDOCRINE A:   No acute issues. 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 Hx CVA, TIA. Following commands with R, WD on left Left mouth and facial droop Sedation off P:   Neuro following / managing. Sedation:  Propofol gtt / Fentanyl PRN. RASS goal: 0 to -1. Daily WUA.  Family updated: No family at bedside  Interdisciplinary Family Meeting v Palliative Care Meeting:  Due by: 07/17/17.  Magdalen Spatz, AGACNP-BC [place]  [facility] Pager: [phone]  or [phone] 07/12/2017, 9:31 AM

## 2017-07-12 NOTE — Progress Notes (Signed)
Referring Physician(s): Dr Lavera Guise  Supervising Physician: Luanne Bras  Patient Status:  [facility] - In-pt  Chief Complaint:  CVA   Subjective:  1/28: S/P RT common carotid arteriogram followed by complete revascularization RT ACA with 7mg  of superselective  IA TPA with a  TICI 3 reperfusion.Marland Kitchen TICI 2b reperfusion with IV tpa.  Pt is able to speak some today Verbalizes good morning and tells me he can wiggle his ears--- which he did  (right) Moving right side to command  MRI/MRA today: IMPRESSION: MRI head: 1. Acute moderate RIGHT MCA and small RIGHT distal ACA infarcts. Small RIGHT posterior watershed territory infarct. Minimal petechial hemorrhage without hemorrhagic conversion. 2. 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 Multiple small vessel supra-and infratentorial old infarcts.  MRA head: 1. Successful revascularization of RIGHT middle cerebral artery occlusion. Distal anterior cerebral artery's not included on typical MRA head. 2. Focally occluded RIGHT P3 segment (possibly overestimated by calcification and resultant artifact) with immediate reconstitution. Severe stenosis RIGHT P1 segment.  Allergies: Tape; Lasix [furosemide]; Penicillins; and Sulfa drugs cross reactors  Medications: Prior to Admission medications   Medication Sig Start Date End Date Taking? Authorizing Provider  amLODipine (NORVASC) 5 MG tablet TAKE ONE TABLET BY MOUTH DAILY Patient taking differently: Take 5 mg by mouth once a day 06/23/17   [place], Peter M, MD  benazepril (LOTENSIN) 40 MG tablet TAKE ONE TABLET BY MOUTH DAILY Patient not taking: Reported on 07/11/2017 06/21/17   [place], Peter M, MD  carvedilol (COREG) 25 MG tablet TAKE ONE TABLET BY MOUTH TWICE A DAY 04/20/17   [place], Peter M, MD  NITROSTAT 0.4 MG SL tablet PLACE 1 TABLET (0.4 MG TOTAL) UNDER THE TONGUE EVERY 5 (FIVE)  MINUTESAS NEEDED FOR CHEST PAIN (UP TO 3 DOSES). 12/13/16   [place], Peter M, MD  ranitidine (ZANTAC) 150 MG tablet TAKE ONE TABLET BY MOUTH TWICE A DAY Patient taking differently: Take 150 mg by mouth two times a day 12/28/16   [place], Peter M, MD  rosuvastatin (CRESTOR) 20 MG tablet TAKE 1 TABLET BY MOUTH DAILY Patient taking differently: Take 20 mg by mouth once a day 01/05/17   [place], Peter M, MD  warfarin (COUMADIN) 2.5 MG tablet TAKE 1/2 TO 1 TABLET BY MOUTH DAILY AS DIRECTED BY COUMADIN CLINIC Patient taking differently: Take 1.25 mg by mouth at bedtime on Sun/Tues/Thurs/Sat and 2.5 mg on Mon/Wed/Fri 07/09/16   [place], Peter M, MD  warfarin (COUMADIN) 2.5 MG tablet TAKE 1/2 TO 1 TABLET BY MOUTH DAILY AS DIRECTED BY COUMADIN CLINIC Patient not taking: Reported on 07/11/2017 05/24/17   [place], Peter M, MD     Vital Signs: BP 118/66   Pulse 70   Temp 98.6 F (37 C) (Axillary)   Resp 14   Ht 5\' 8"  (1.727 m)   Wt 176 lb 12.9 oz (80.2 kg)   SpO2 96%   BMI 26.88 kg/m   Physical Exam  HENT:  Left facial droop Left mouth droop Unable to open left eye without help  Musculoskeletal:  Moves right leg and arm to command No movement on left   Skin: Skin is warm and dry.  Right groin NT no bleeding Rt foot 2+ pulses  Nursing note and vitals reviewed.   Imaging: Ct Angio Head W Or Wo Contrast  Result Date: 07/11/2017 CLINICAL DATA:  84 y/o  M; code stroke with left-sided deficits. EXAM: CT ANGIOGRAPHY HEAD AND NECK  TECHNIQUE: Multidetector CT imaging of the head and neck was performed using the standard protocol during bolus administration of intravenous contrast. Multiplanar CT image reconstructions and MIPs were obtained to evaluate the vascular anatomy. Carotid stenosis measurements (when applicable) are obtained utilizing NASCET criteria, using the distal internal carotid diameter as the denominator. CONTRAST:  45mL ISOVUE-370 IOPAMIDOL (ISOVUE-370) INJECTION 76% COMPARISON:   07/11/2016 CT head.  05/15/2011 MRA head. FINDINGS: CTA NECK FINDINGS Aortic arch: Standard branching. Imaged portion shows no evidence of aneurysm or dissection. No significant stenosis of the major arch vessel origins. Moderate calcific atherosclerosis. Status post LIMA and saphenous CABG grafts. Right carotid system: No evidence of dissection, stenosis (50% or greater) or occlusion. Dense calcified plaque of the carotid bifurcation with mild less than 50% distal common carotid and internal carotid artery stenosis. Left carotid system: No evidence of dissection, stenosis (50% or greater) or occlusion. Dense calcified plaque of carotid bifurcation with mild less than 50% proximal ICA stenosis. Calcified plaque of left mid common carotid artery with minimal less than 30% stenosis. Vertebral arteries: Codominant. No evidence of dissection, stenosis (50% or greater) or occlusion. Right V1 calcified plaque with mild less than 50% stenosis. Skeleton: Severe cervical spondylosis with extensive discogenic degenerative changes greatest at the C5-C7 levels and severe facet hypertrophy. Reversal of cervical curvature with apex at C5. Multiple levels of canal stenosis greatest at the C5-6 level where it is moderate to severe. Uncovertebral and facet hypertrophy narrows the neural foramen greatest at the bilateral C5-6 and C6-7 levels. Other neck: Negative. Upper chest: Negative. Review of the MIP images confirms the above findings CTA HEAD FINDINGS Anterior circulation: Right M2 superior division proximal occlusion (series 7, image 90). Poor collateralization in the right M2 superior division distribution. Left distal M1 short segment of moderate stenosis. Bilateral carotid siphon calcific plaque with moderate bilateral paraclinoid stenosis. Posterior circulation: New focal calcification within the right P4 parietooccipital branch origin (series 8, image 116) with normal downstream opacification of the artery, possibly  atherosclerotic or nonocclusive calcified plaque thrombus. Bilateral P1 irregularity with mild stenosis. Venous sinuses: As permitted by contrast timing, patent. Anatomic variants: Anterior communicating artery. No posterior communicating artery identified, likely hypoplastic or absent. Delayed phase: No abnormal intracranial enhancement. Review of the MIP images confirms the above findings IMPRESSION: CTA neck: 1. No dissection, aneurysm, or high-grade stenosis by NASCET criteria. 2. Calcified plaque of aorta and carotid bifurcations with mild less than 50% proximal ICA stenosis bilaterally. 3. Severe cervical spondylosis greatest at C5 and C6 levels. At least moderate C5-6 canal stenosis. CTA head: 1. Right M2 superior division proximal occlusion with poor downstream collateralization. 2. New calcification within right P 4 parieto-occipital origin, possibly atherosclerotic or nonocclusive calcified plaque thrombus. 3. Intracranial atherosclerosis with multiple areas of mild-to-moderate stenosis. No high-grade stenosis, aneurysm, or additional large vessel occlusion. These results were communicated to Dr. Lorraine Lax at [place] 1/28/2019by text page via the [facility] messaging system. Electronically Signed   By: Kristine Garbe M.D.   On: 07/11/2017 19:05   Ct Head Wo Contrast  Result Date: 07/11/2017 CLINICAL DATA:  84 y/o M; post intra-arterial intervention for stroke. EXAM: CT HEAD WITHOUT CONTRAST TECHNIQUE: Contiguous axial images were obtained from the base of the skull through the vertex without intravenous contrast. COMPARISON:  07/11/2017 CT head and CT angiogram head. FINDINGS: Brain: Interval loss of gray-white differentiation in the right perisylvian frontal and parietal lobes (series 3, image 26 and series 6, image 21) likely representing developing acute infarction.  No hemorrhage. Stable small chronic cortical infarcts in the frontal and right parietal lobes, small chronic infarct in left  inferior cerebellar hemisphere, mild chronic microvascular ischemic changes, and parenchymal volume loss of the brain. Vascular: Contrast is present within the vascular structures. Skull: Normal. Negative for fracture or focal lesion. Sinuses/Orbits: Persistent partial opacification of left anterior ethmoid and frontal sinuses. Fluid level in the right maxillary sinus. Normal aeration of mastoid air cells. Other: None. IMPRESSION: 1. Interval loss of gray-white differentiation in the right perisylvian frontal and parietal lobes likely representing a developing acute infarction. No hemorrhage. 2. Stable background of chronic microvascular ischemic changes and parenchymal volume loss of the brain. Electronically Signed   By: Kristine Garbe M.D.   On: 07/11/2017 21:50   Ct Angio Neck W And/or Wo Contrast  Result Date: 07/11/2017 CLINICAL DATA:  84 y/o  M; code stroke with left-sided deficits. EXAM: CT ANGIOGRAPHY HEAD AND NECK TECHNIQUE: Multidetector CT imaging of the head and neck was performed using the standard protocol during bolus administration of intravenous contrast. Multiplanar CT image reconstructions and MIPs were obtained to evaluate the vascular anatomy. Carotid stenosis measurements (when applicable) are obtained utilizing NASCET criteria, using the distal internal carotid diameter as the denominator. CONTRAST:  1mL ISOVUE-370 IOPAMIDOL (ISOVUE-370) INJECTION 76% COMPARISON:  07/11/2016 CT head.  05/15/2011 MRA head. FINDINGS: CTA NECK FINDINGS Aortic arch: Standard branching. Imaged portion shows no evidence of aneurysm or dissection. No significant stenosis of the major arch vessel origins. Moderate calcific atherosclerosis. Status post LIMA and saphenous CABG grafts. Right carotid system: No evidence of dissection, stenosis (50% or greater) or occlusion. Dense calcified plaque of the carotid bifurcation with mild less than 50% distal common carotid and internal carotid artery stenosis.  Left carotid system: No evidence of dissection, stenosis (50% or greater) or occlusion. Dense calcified plaque of carotid bifurcation with mild less than 50% proximal ICA stenosis. Calcified plaque of left mid common carotid artery with minimal less than 30% stenosis. Vertebral arteries: Codominant. No evidence of dissection, stenosis (50% or greater) or occlusion. Right V1 calcified plaque with mild less than 50% stenosis. Skeleton: Severe cervical spondylosis with extensive discogenic degenerative changes greatest at the C5-C7 levels and severe facet hypertrophy. Reversal of cervical curvature with apex at C5. Multiple levels of canal stenosis greatest at the C5-6 level where it is moderate to severe. Uncovertebral and facet hypertrophy narrows the neural foramen greatest at the bilateral C5-6 and C6-7 levels. Other neck: Negative. Upper chest: Negative. Review of the MIP images confirms the above findings CTA HEAD FINDINGS Anterior circulation: Right M2 superior division proximal occlusion (series 7, image 90). Poor collateralization in the right M2 superior division distribution. Left distal M1 short segment of moderate stenosis. Bilateral carotid siphon calcific plaque with moderate bilateral paraclinoid stenosis. Posterior circulation: New focal calcification within the right P4 parietooccipital branch origin (series 8, image 116) with normal downstream opacification of the artery, possibly atherosclerotic or nonocclusive calcified plaque thrombus. Bilateral P1 irregularity with mild stenosis. Venous sinuses: As permitted by contrast timing, patent. Anatomic variants: Anterior communicating artery. No posterior communicating artery identified, likely hypoplastic or absent. Delayed phase: No abnormal intracranial enhancement. Review of the MIP images confirms the above findings IMPRESSION: CTA neck: 1. No dissection, aneurysm, or high-grade stenosis by NASCET criteria. 2. Calcified plaque of aorta and carotid  bifurcations with mild less than 50% proximal ICA stenosis bilaterally. 3. Severe cervical spondylosis greatest at C5 and C6 levels. At least moderate C5-6 canal stenosis. CTA  head: 1. Right M2 superior division proximal occlusion with poor downstream collateralization. 2. New calcification within right P 4 parieto-occipital origin, possibly atherosclerotic or nonocclusive calcified plaque thrombus. 3. Intracranial atherosclerosis with multiple areas of mild-to-moderate stenosis. No high-grade stenosis, aneurysm, or additional large vessel occlusion. These results were communicated to Dr. Lorraine Lax at [place] 1/28/2019by text page via the [facility] messaging system. Electronically Signed   By: Kristine Garbe M.D.   On: 07/11/2017 19:05   Mr Jodene Nam Head Wo Contrast  Result Date: 07/12/2017 CLINICAL DATA:  LEFT-sided weakness, RIGHT gaze deficit. Status post tPA and complete revascularization of RIGHT ACA occlusion. EXAM: MRI HEAD WITHOUT CONTRAST MRA HEAD WITHOUT CONTRAST TECHNIQUE: Multiplanar, multiecho pulse sequences of the brain and surrounding structures were obtained without intravenous contrast. Angiographic images of the head were obtained using MRA technique without contrast. COMPARISON:  CT HEAD and CTA HEAD and neck July 11, 2016. MRI of the head May 15, 2011 FINDINGS: MRI HEAD FINDINGS INTRACRANIAL CONTENTS: Confluent RIGHT frontoparietal reduced diffusion including medial cortex with low ADC values. Discontinuous RIGHT frontal and RIGHT basal ganglia reduced diffusion and low ADC values. Subcentimeter reduced diffusion and low ADC values RIGHT posterior temporal lobe. Faint susceptibility artifact RIGHT temporoparietal lobes compatible with petechial hemorrhage. Scattered chronic micro hemorrhages. LEFT cerebellar encephalomalacia. Small area RIGHT temporal occipital and LEFT frontal encephalomalacia. Old small RIGHT cerebellar, RIGHT pontine, RIGHT thalamus and RIGHT basal ganglia to corona  radiata infarct. Prominent basal ganglia perivascular spaces associated with chronic small vessel ischemic disease. A few additional subcentimeter supratentorial white matter FLAIR T2 hyperintensities compatible with mild chronic small vessel ischemic disease, less than expected for age. No advanced parenchymal brain volume loss for age. No midline shift, mass effect or masses. No abnormal extra-axial fluid collections. VASCULAR: Normal major intracranial vascular flow voids present at skull base. SKULL AND UPPER CERVICAL SPINE: No abnormal sellar expansion. No suspicious calvarial bone marrow signal. 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 Craniocervical junction maintained. SINUSES/ORBITS: Pan paranasal sinusitis with large RIGHT maxillary sinus air-fluid level. Trace RIGHT mastoid effusion. Included ocular globes and orbital contents are non-suspicious. OTHER: None. MRA HEAD FINDINGS ANTERIOR CIRCULATION: Normal flow related enhancement of the included cervical, petrous, cavernous and supraclinoid internal carotid arteries. Moderate stenosis bilateral para clinoid internal carotid arteries. Patent anterior communicating artery. Patent anterior and middle cerebral arteries, bilateral anterior cerebral arteries distal segments on included on MRA. No large vessel occlusion, flow limiting stenosis, aneurysm. POSTERIOR CIRCULATION: Codominant vertebral arteries. Patent vertebrobasilar junction and basilar artery. Main branch vessels not well demonstrated. Severe stenosis RIGHT P1 segment, moderate LEFT P2 stenosis. RIGHT P3 occlusion with thready reconstitution. No  aneurysm. ANATOMIC VARIANTS: None. Source images and MIP images were reviewed. IMPRESSION: MRI head: 1. Acute moderate RIGHT MCA and small RIGHT distal ACA infarcts. Small RIGHT posterior watershed territory infarct. Minimal petechial hemorrhage without hemorrhagic conversion. 2. 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 Multiple small vessel supra-and infratentorial old infarcts. MRA head: 1. Successful revascularization of RIGHT middle cerebral artery occlusion. Distal anterior cerebral artery's not included on typical MRA head. 2. Focally occluded RIGHT P3 segment (possibly overestimated by calcification and resultant artifact) with immediate reconstitution. Severe stenosis RIGHT P1 segment. Electronically Signed   By: Elon Alas M.D.   On: 07/12/2017 02:04   Mr Brain Wo Contrast  Result Date: 07/12/2017 CLINICAL DATA:  LEFT-sided weakness, RIGHT gaze deficit. Status post tPA and complete revascularization of  RIGHT ACA occlusion. EXAM: MRI HEAD WITHOUT CONTRAST MRA HEAD WITHOUT CONTRAST TECHNIQUE: Multiplanar, multiecho pulse sequences of the brain and surrounding structures were obtained without intravenous contrast. Angiographic images of the head were obtained using MRA technique without contrast. COMPARISON:  CT HEAD and CTA HEAD and neck July 11, 2016. MRI of the head May 15, 2011 FINDINGS: MRI HEAD FINDINGS INTRACRANIAL CONTENTS: Confluent RIGHT frontoparietal reduced diffusion including medial cortex with low ADC values. Discontinuous RIGHT frontal and RIGHT basal ganglia reduced diffusion and low ADC values. Subcentimeter reduced diffusion and low ADC values RIGHT posterior temporal lobe. Faint susceptibility artifact RIGHT temporoparietal lobes compatible with petechial hemorrhage. Scattered chronic micro hemorrhages. LEFT cerebellar encephalomalacia. Small area RIGHT temporal occipital and LEFT frontal encephalomalacia. Old small RIGHT cerebellar, RIGHT pontine, RIGHT thalamus and RIGHT basal ganglia to corona radiata infarct. Prominent basal ganglia perivascular spaces associated with chronic small vessel ischemic disease. A few additional subcentimeter supratentorial white matter FLAIR T2 hyperintensities  compatible with mild chronic small vessel ischemic disease, less than expected for age. No advanced parenchymal brain volume loss for age. No midline shift, mass effect or masses. No abnormal extra-axial fluid collections. VASCULAR: Normal major intracranial vascular flow voids present at skull base. SKULL AND UPPER CERVICAL SPINE: No abnormal sellar expansion. No suspicious calvarial bone marrow signal. 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 Craniocervical junction maintained. SINUSES/ORBITS: Pan paranasal sinusitis with large RIGHT maxillary sinus air-fluid level. Trace RIGHT mastoid effusion. Included ocular globes and orbital contents are non-suspicious. OTHER: None. MRA HEAD FINDINGS ANTERIOR CIRCULATION: Normal flow related enhancement of the included cervical, petrous, cavernous and supraclinoid internal carotid arteries. Moderate stenosis bilateral para clinoid internal carotid arteries. Patent anterior communicating artery. Patent anterior and middle cerebral arteries, bilateral anterior cerebral arteries distal segments on included on MRA. No large vessel occlusion, flow limiting stenosis, aneurysm. POSTERIOR CIRCULATION: Codominant vertebral arteries. Patent vertebrobasilar junction and basilar artery. Main branch vessels not well demonstrated. Severe stenosis RIGHT P1 segment, moderate LEFT P2 stenosis. RIGHT P3 occlusion with thready reconstitution. No  aneurysm. ANATOMIC VARIANTS: None. Source images and MIP images were reviewed. IMPRESSION: MRI head: 1. Acute moderate RIGHT MCA and small RIGHT distal ACA infarcts. Small RIGHT posterior watershed territory infarct. Minimal petechial hemorrhage without hemorrhagic conversion. 2. 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 Multiple small vessel supra-and infratentorial old infarcts. MRA head: 1. Successful revascularization of  RIGHT middle cerebral artery occlusion. Distal anterior cerebral artery's not included on typical MRA head. 2. Focally occluded RIGHT P3 segment (possibly overestimated by calcification and resultant artifact) with immediate reconstitution. Severe stenosis RIGHT P1 segment. Electronically Signed   By: Elon Alas M.D.   On: 07/12/2017 02:04   Portable Chest Xray  Result Date: 07/12/2017 CLINICAL DATA:  84 year old male status post right MCA M2 vessel occlusion treated with tPA and revascularization. Intubated. EXAM: PORTABLE CHEST 1 VIEW COMPARISON:  07/11/2017 and earlier. FINDINGS: Portable AP semi upright view at 0617 hours. Stable endotracheal tube tip just below the clavicles. Stable lung volumes. Mediastinal contours remain within normal limits. Calcified aortic atherosclerosis. Prior CABG. Streaky right mid and lower lung opacity with blunting of the right costophrenic angle appears chronic and not significantly changed compared to 2016. No pneumothorax, pulmonary edema or acute pulmonary opacity. IMPRESSION: 1. Stable endotracheal tube. 2. Chronic opacity in the right lower lung. No acute cardiopulmonary abnormality. Electronically Signed   By: Genevie Ann M.D.   On: 07/12/2017  08:40   Portable Chest X-ray  Result Date: 07/11/2017 CLINICAL DATA:  Respiratory failure EXAM: PORTABLE CHEST 1 VIEW COMPARISON:  Chest x-ray dated 01/13/2015. FINDINGS: Heart size and mediastinal contours are stable in size and configuration. Aortic atherosclerosis. Median sternotomy wires appear intact. Endotracheal tube is adequately positioned with tip approximately 5 cm above the carina. Chronic small pleural effusion and/or pleural thickening at the right lung base. No new lung findings. No pneumothorax seen. IMPRESSION: 1. No active disease.  No evidence of pneumonia or pulmonary edema. 2. Chronic small right pleural effusion and/or pleural thickening, stable. 3. Aortic atherosclerosis. Electronically Signed   By:  Franki Cabot M.D.   On: 07/11/2017 22:27   Ct Head Code Stroke Wo Contrast  Result Date: 07/11/2017 CLINICAL DATA:  Code stroke. 84 y/o M; left-sided deficits and right-sided gaze. EXAM: CT HEAD WITHOUT CONTRAST TECHNIQUE: Contiguous axial images were obtained from the base of the skull through the vertex without intravenous contrast. COMPARISON:  05/13/2011 CT head. FINDINGS: Brain: Stable small chronic cortical infarcts in left frontal and right parietal lobes. Stable small chronic lacunar infarct in the right caudate body and right thalamus. Chronic infarction in the left inferior cerebellar hemisphere. Background of mild chronic microvascular ischemic changes and parenchymal volume loss of the brain. Vascular: Calcific atherosclerosis of carotid siphons. Hyperdensity within right distal M1 (series 6, image 21). Skull: Normal. Negative for fracture or focal lesion. Sinuses/Orbits: Left anterior ethmoid and frontal sinus partial opacification in chronic inflammatory changes. Right intra-ocular lens replacement. Other: None. ASPECTS [facility] Stroke Program Early CT Score) - Ganglionic level infarction (caudate, lentiform nuclei, internal capsule, insula, M1-M3 cortex): 7 - Supraganglionic infarction (M4-M6 cortex): 3 Total score (0-10 with 10 being normal): 10 IMPRESSION: 1. No acute intracranial abnormality identified. 2. ASPECTS is 10 3. Dense distal right M1, suspected thrombus. 4. Stable chronic microvascular ischemic changes, parenchymal volume loss, and small cortical infarctions. These results were called by telephone at the time of interpretation on 07/11/2017 at 6:43 pm to Dr. Lorraine Lax, who verbally acknowledged these results. Electronically Signed   By: Kristine Garbe M.D.   On: 07/11/2017 18:43    Labs:  CBC: Recent Labs    11/17/16 0958 07/11/17 1820 07/11/17 1826 07/12/17 0359  WBC 4.9 7.2  --  8.4  HGB 14.2 14.4 15.0 12.7*  HCT 43.7 44.1 44.0 38.5*  PLT 234 219  --  196     COAGS: Recent Labs    04/20/17 0803 05/13/17 0929 06/23/17 0741 07/11/17 1820  INR 2.6 2.5 1.8 1.48  APTT  --   --   --  31    BMP: Recent Labs    11/17/16 0958 07/11/17 1820 07/11/17 1826 07/12/17 0359  NA 140 138 139 138  K 4.7 4.6 4.6 4.1  CL 106 107 105 110  CO2 25 21*  --  19*  GLUCOSE 93 111* 107* 138*  BUN 22 21* 21* 22*  CALCIUM 9.1 9.2  --  8.3*  CREATININE 1.24* 1.29* 1.20 1.17  GFRNONAA  --  48*  --  55*  GFRAA  --  56*  --  >60    LIVER FUNCTION TESTS: Recent Labs    11/17/16 0958 07/11/17 1820  BILITOT 0.7 0.9  AST 19 25  ALT 15 14*  ALKPHOS 49 57  PROT 6.4 6.6  ALBUMIN 4.3 4.3    Assessment and Plan:  CVA R ACA revascularization in IR 1/28 pm Will follow Plan per Dr Erlinda Hong  Electronically Signed:  Koula Venier A, PA-C 07/12/2017, 11:42 AM   I spent a total of 15 minutes at the the patient's bedside AND on the patient's hospital floor or unit, greater than 50% of which was counseling/coordinating care for CVA; R ACA revascularization

## 2017-07-12 NOTE — Progress Notes (Signed)
Orthopedic Tech Progress Note Patient Details:  Arthur [name] [birth year] [id number]  Ortho Devices Type of Ortho Device: Prafo boot/shoe Ortho Device/Splint Location: (B) LE Ortho Device/Splint Interventions: Ordered, Application   Post Interventions Patient Tolerated: Well Instructions Provided: Care of device   Braulio Bosch 07/12/2017, 3:16 PM

## 2017-07-12 NOTE — Evaluation (Signed)
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 Work up revealed rightMCA and ACA patchy scattered moderate to largeinfarct s/p IV and IA  Clinical Impression  Orders received for PT evaluation. Patient demonstrates deficits in functional mobility as indicated below. Will benefit from continued skilled PT to address deficits and maximize function. Will see as indicated and progress as tolerated.  Prior to admission patient was independent with activity, resides at home with wife who is available at all times. Give current level of impairments, recommend comprehensive post acute rehabilitation at this time. Will follow acutely.    Follow Up Recommendations CIR    Equipment Recommendations  (TBD)    Recommendations for Other Services Rehab consult     Precautions / Restrictions Precautions Precautions: Fall      Mobility  Bed Mobility Overal bed mobility: Needs Assistance Bed Mobility: Rolling;Sidelying to Sit;Supine to Sit Rolling: Max assist;+2 for physical assistance Sidelying to sit: Max assist;+2 for physical assistance Supine to sit: Total assist;+2 for physical assistance     General bed mobility comments: Max assist of 2 persons for rolling to left side, multi modal cues for RUE placement on bed rail, increased extensor tone noted throughout left side requiring maximal to total assist to elevate to upright and rotate to EOB. Total assist to return to supine post activity  Transfers Overall transfer level: Needs assistance Equipment used: 2 person hand held assist(face to face  with gait belt) Transfers: Sit to/from Stand Sit to Stand: Max  assist;+2 physical assistance         General transfer comment: +2 max assist to power to standing with face to face and gait belt. LLE positioned prior to initiation of mevement, noted LLE clonus requiring manual block out. Use of extensor tone to facilitate power up.  Ambulation/Gait                Stairs            Wheelchair Mobility    Modified Rankin (Stroke Patients Only) Modified Rankin (Stroke Patients Only) Pre-Morbid Rankin Score: No symptoms Modified Rankin: Severe disability     Balance Overall balance assessment: Needs assistance Sitting-balance support: Single extremity supported;Feet supported Sitting balance-Leahy Scale: Poor Sitting balance - Comments: patient required moderate to maximal support in sitting, noted extensor tone with posterior trunk bias, some activation of trunk but unable ot maintain Postural control: Posterior lean   Standing balance-Leahy Scale: Zero                               Pertinent Vitals/Pain Pain Assessment: No/denies pain    Home Living Family/patient expects to be discharged to:: Private residence Living Arrangements: Spouse/significant other Available Help at Discharge: Family;Available 24 hours/day Type of Home: House Home Access: Stairs to enter Entrance Stairs-Rails: Left Entrance Stairs-Number of Steps: 4 Home Layout: One level Home Equipment: Wheelchair - manual      Prior Function Level of Independence: Independent               Hand Dominance   Dominant Hand: Right    Extremity/Trunk Assessment   Upper Extremity Assessment Upper Extremity Assessment: LUE  deficits/detail LUE Deficits / Details: LUE with noted increased tone and synergistic pattern present, no isolated movement at htis time LUE Coordination: decreased fine motor;decreased gross motor    Lower Extremity Assessment Lower Extremity Assessment: LLE deficits/detail LLE Deficits / Details: noted increased  tone throughout, trace muscle activitation once tone released. Brunstrom 3 pattern of synergistic movement at this time. LLE Sensation: decreased proprioception LLE Coordination: decreased gross motor;decreased fine motor       Communication   Communication: (trifocals)  Cognition Arousal/Alertness: Awake/alert Behavior During Therapy: WFL for tasks assessed/performed Overall Cognitive Status: Impaired/Different from baseline Area of Impairment: Following commands;Safety/judgement;Awareness;Problem solving                       Following Commands: Follows one step commands with increased time Safety/Judgement: Decreased awareness of deficits Awareness: Intellectual Problem Solving: Requires verbal cues;Requires tactile cues;Slow processing General Comments: Patient with decreased attention to left side, multi modal cues to attend. visual impairments noted.      General Comments      Exercises     Assessment/Plan    PT Assessment Patient needs continued PT services  PT Problem List Decreased strength;Decreased range of motion;Decreased activity tolerance;Decreased balance;Decreased mobility;Decreased coordination;Decreased cognition;Decreased knowledge of use of DME;Decreased safety awareness;Decreased knowledge of precautions;Cardiopulmonary status limiting activity;Impaired sensation;Impaired tone;Decreased skin integrity       PT Treatment Interventions DME instruction;Gait training;Functional mobility training;Therapeutic activities;Therapeutic exercise;Balance training;Neuromuscular re-education;Cognitive remediation;Patient/family education    PT Goals (Current goals can be found in the Care Plan section)  Acute Rehab PT Goals Patient Stated Goal: none stated PT Goal Formulation: With patient/family Time For Goal Achievement: 07/26/17 Potential to Achieve Goals: Fair    Frequency Min 4X/week   Barriers to discharge        Co-evaluation PT/OT/SLP  Co-Evaluation/Treatment: Yes Reason for Co-Treatment: Complexity of the patient's impairments (multi-system involvement);Necessary to address cognition/behavior during functional activity;For patient/therapist safety;To address functional/ADL transfers PT goals addressed during session: Mobility/safety with mobility;Balance OT goals addressed during session: ADL's and self-care       AM-PAC PT "6 Clicks" Daily Activity  Outcome Measure Difficulty turning over in bed (including adjusting bedclothes, sheets and blankets)?: Unable Difficulty moving from lying on back to sitting on the side of the bed? : Unable Difficulty sitting down on and standing up from a chair with arms (e.g., wheelchair, bedside commode, etc,.)?: Unable Help needed moving to and from a bed to chair (including a wheelchair)?: Total Help needed walking in hospital room?: Total Help needed climbing 3-5 steps with a railing? : Total 6 Click Score: 6    End of Session Equipment Utilized During Treatment: Gait belt Activity Tolerance: Patient tolerated treatment well Patient left: in bed;with call bell/phone within reach;with family/visitor present(bed alarm unable to engage, will swap bed per nsg) Nurse Communication: Mobility status PT Visit Diagnosis: Other symptoms and signs involving the nervous system (R29.898);Hemiplegia and hemiparesis Hemiplegia - Right/Left: Left Hemiplegia - dominant/non-dominant: Dominant Hemiplegia - caused by: Cerebral infarction    Time: 1353-1430 PT Time Calculation (min) (ACUTE ONLY): 37 min   Charges:   PT Evaluation $PT Eval Moderate Complexity: 1 Mod     PT G Codes:        Alben Deeds, PT DPT  Board Certified Neurologic Specialist [phone]   Duncan Dull 07/12/2017, 3:04 PM

## 2017-07-12 NOTE — Progress Notes (Signed)
Inpatient Rehabilitation  Per PT, SLP request, patient was screened by Gunnar Fusi for appropriateness for an Inpatient Acute Rehab consult.  At this time we are recommending an Inpatient Rehab consult.  Please order if you are agreeable.    Carmelia Roller., CCC/SLP Admission Coordinator  [place]  Cell [phone]

## 2017-07-12 NOTE — Progress Notes (Signed)
Patient transported on vent from 4N-30 to MRI and back without complication.

## 2017-07-12 NOTE — Progress Notes (Signed)
Decreased patients FI02 to 50% due to SPO2 as well as arterial blood gas results.  Patient continuing to maintain 100% sat with the change.  RT will continue to assess and monitor.

## 2017-07-12 NOTE — Procedures (Signed)
Extubation Procedure Note  Patient Details:   Name: Arthur [name] DOB: Jun 07, 1929 MRN: [id number]   Airway Documentation:  Airway 7.5 mm (Active)  Secured at (cm) 22 cm 07/12/2017  9:17 AM  Measured From Lips 07/12/2017  9:17 AM  Secured Location Left 07/12/2017  9:17 AM  Secured By [employer] 07/12/2017  9:17 AM  Tube Holder Repositioned Yes 07/12/2017  9:17 AM  Site Condition Dry 07/12/2017  9:17 AM    Evaluation  O2 sats: stable throughout Complications: No apparent complications Patient did tolerate procedure well. Bilateral Breath Sounds: Clear, Diminished   Yes, pt extubated, placed on 4 lpm , no stridor heard. Breath sounds equal and bilat.  [name] 07/12/2017, 10:15 AM

## 2017-07-12 NOTE — Evaluation (Signed)
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 Work up revealed rightMCA and ACA patchy scattered moderate to largeinfarct s/p IV and IA   Clinical Impression   PTA, pt was living with his wife and was independent. Pt currently requiring Max A for ADLs and Max A +2 for functional transfers. Pt presenting with inattention to left visual field, decreased functional use of LUE, poor balance, and decreased cognition. Pt would benefit from further acute OT to facilitate safe dc. Recommend dc to CIR for intensive OT to optimize safety and independence with ADLs and functional mobility as well as decrease caregiver burden.     Follow Up Recommendations  CIR;Supervision/Assistance - 24 hour    Equipment Recommendations  Other (comment)(Defer to next venue)    Recommendations for Other Services Rehab consult;PT consult;Speech consult     Precautions / Restrictions Precautions Precautions: Fall Restrictions Weight Bearing Restrictions: No      Mobility Bed Mobility Overal bed mobility: Needs Assistance Bed Mobility: Rolling;Sidelying to Sit;Supine to Sit Rolling: Max assist;+2 for physical assistance Sidelying to sit: Max assist;+2 for physical assistance Supine to sit: Total assist;+2 for physical assistance     General bed mobility comments: Max assist of 2 persons for rolling to left side, multi modal cues for RUE placement on bed rail, increased extensor tone noted throughout left side requiring maximal to total assist to elevate to upright and rotate to EOB. Total assist to return to supine post activity  Transfers Overall transfer level: Needs  assistance Equipment used: 2 person hand held assist(face to face  with gait belt) Transfers: Sit to/from Stand Sit to Stand: Max assist;+2 physical assistance         General transfer comment: +2 max assist to power to standing with face to face and gait belt. LLE positioned prior to initiation of mevement, noted LLE clonus requiring manual block out. Use of extensor tone to facilitate power up.    Balance Overall balance assessment: Needs assistance Sitting-balance support: Single extremity supported;Feet supported Sitting balance-Leahy Scale: Poor Sitting balance - Comments: patient required moderate to maximal support in sitting, noted extensor tone with posterior trunk bias, some activation of trunk but unable ot maintain Postural control: Posterior lean   Standing balance-Leahy Scale: Zero                             ADL either performed or assessed with clinical judgement   ADL Overall ADL's : Needs assistance/impaired Eating/Feeding: NPO   Grooming: Wash/dry face;Maximal assistance;Sitting Grooming Details (indicate cue type and reason): Max A to maintain sitting at EOB. Pt washing his face with his RUE requiring increased time for processing.  Upper Body Bathing: Moderate assistance;Bed level(Supported sitting)   Lower Body Bathing: Maximal assistance;+2 for physical assistance;Sit to/from stand;Bed level   Upper Body Dressing : Moderate assistance;Bed level(Supported sitting)   Lower Body Dressing: Maximal assistance;Bed level;Sit to/from stand;+2 for physical assistance Lower Body Dressing Details (indicate cue type and reason): don socks with Max A             Functional mobility during ADLs: Maximal assistance;+2 for physical assistance(Sit<>stand only) General ADL Comments: Pt demonstrating  decreased fucntional performance with poor functional use of LUE, vision, balance, and cognition.      Vision Baseline Vision/History: Wears  glasses(trifocals) Wears Glasses: At all times Patient Visual Report: No change from baseline Vision Assessment?: Yes;Vision impaired- to be further tested in functional context Visual Fields: Left visual field deficit Additional Comments: Pt performing head turns to left visual field with Mod VCs. Decreased eye movements to L. Will continue to assess. During visual field testing, pt able to see objects coming from right visual field and unable to locate items from left until midline.     Perception     Praxis      Pertinent Vitals/Pain Pain Assessment: No/denies pain     Hand Dominance Right   Extremity/Trunk Assessment Upper Extremity Assessment Upper Extremity Assessment: LUE deficits/detail LUE Deficits / Details: LUE with noted increased tone and synergistic pattern present, no isolated movement at this time. Brunnstromm strage 3. No active movement for grasp.  LUE Coordination: decreased fine motor;decreased gross motor   Lower Extremity Assessment Lower Extremity Assessment: LLE deficits/detail LLE Deficits / Details: noted increased tone throughout, trace muscle activitation once tone released. Brunstrom 3 pattern of synergistic movement at this time. LLE Sensation: decreased proprioception LLE Coordination: decreased gross motor;decreased fine motor   Cervical / Trunk Assessment Cervical / Trunk Assessment: Other exceptions Cervical / Trunk Exceptions: extension syngery pattern at trunk   Communication Communication Communication: Other (comment)(Slow processing and speech)   Cognition Arousal/Alertness: Awake/alert Behavior During Therapy: WFL for tasks assessed/performed Overall Cognitive Status: Impaired/Different from baseline Area of Impairment: Following commands;Safety/judgement;Awareness;Problem solving;Attention                   Current Attention Level: Sustained   Following Commands: Follows one step commands with increased  time Safety/Judgement: Decreased awareness of deficits Awareness: Intellectual Problem Solving: Requires verbal cues;Requires tactile cues;Slow processing General Comments: Patient with decreased attention to left side, multi modal cues to attend. visual impairments noted. Required increased time anc cues during ADLs, mobility, and answering therpist questions   General Comments  Wife present throughout session. Educated wife on elevation of LUE and speaking to pt on L side to increase awarness.    Exercises     Shoulder Instructions      Home Living Family/patient expects to be discharged to:: Private residence Living Arrangements: Spouse/significant other Available Help at Discharge: Family;Available 24 hours/day Type of Home: House Home Access: Stairs to enter [employer] of Steps: 4 Entrance Stairs-Rails: Left Home Layout: One level     Bathroom Shower/Tub: Occupational psychologist: Standard Bathroom Accessibility: Yes   Home Equipment: Wheelchair - manual      Lives With: Spouse    Prior Functioning/Environment Level of Independence: Independent        Comments: ADLs, IADLs, driving        OT Problem List: Decreased strength;Decreased range of motion;Decreased activity tolerance;Impaired balance (sitting and/or standing);Decreased coordination;Decreased cognition;Impaired vision/perception;Decreased safety awareness;Decreased knowledge of use of DME or AE;Decreased knowledge of precautions;Impaired UE functional use      OT Treatment/Interventions: Self-care/ADL training;Therapeutic exercise;Energy conservation;DME and/or AE instruction;Therapeutic activities;Patient/family education    OT Goals(Current goals can be found in the care plan section) Acute Rehab OT Goals Patient Stated Goal: none stated OT Goal Formulation: With patient/family Time For Goal Achievement: 07/26/17 Potential to Achieve Goals: Good ADL Goals Pt Will Perform  Grooming: with min assist;sitting Pt Will Perform Upper Body Dressing: with min assist;sitting Pt Will Perform Lower Body  Dressing: with mod assist;sit to/from stand Pt Will Transfer to Toilet: with mod assist;bedside commode;stand pivot transfer Additional ADL Goal #1: Pt will attend to ADL items in left visual feild 75% of time with Min VCs Additional ADL Goal #2: Pt will maintain sitting at EOB for 5-10 minutes with Min A in preparation for ADLs  OT Frequency: Min 3X/week   Barriers to D/C:            Co-evaluation PT/OT/SLP Co-Evaluation/Treatment: Yes Reason for Co-Treatment: Complexity of the patient's impairments (multi-system involvement) PT goals addressed during session: Mobility/safety with mobility;Balance OT goals addressed during session: ADL's and self-care      AM-PAC PT "6 Clicks" Daily Activity     Outcome Measure Help from another person eating meals?: Total Help from another person taking care of personal grooming?: A Lot Help from another person toileting, which includes using toliet, bedpan, or urinal?: A Lot Help from another person bathing (including washing, rinsing, drying)?: A Lot Help from another person to put on and taking off regular upper body clothing?: A Lot Help from another person to put on and taking off regular lower body clothing?: A Lot 6 Click Score: 11   End of Session Equipment Utilized During Treatment: Gait belt Nurse Communication: Mobility status  Activity Tolerance: Patient limited by fatigue Patient left: in bed;with call bell/phone within reach;with bed alarm set;with family/visitor present  OT Visit Diagnosis: Unsteadiness on feet (R26.81);Other abnormalities of gait and mobility (R26.89);Muscle weakness (generalized) (M62.81);Low vision, both eyes (H54.2);Other symptoms and signs involving cognitive function;Hemiplegia and hemiparesis Hemiplegia - Right/Left: Left Hemiplegia - dominant/non-dominant: Non-Dominant Hemiplegia -  caused by: Cerebral infarction                Time: 7366-8159 OT Time Calculation (min): 35 min Charges:  OT General Charges $OT Visit: 1 Visit OT Evaluation $OT Eval Moderate Complexity: 1 Mod G-Codes:     Faizon Capozzi MSOT, OTR/L Acute Rehab Pager: [phone] Office: [place] 07/12/2017, 5:23 PM

## 2017-07-12 NOTE — Progress Notes (Signed)
  Echocardiogram 2D Echocardiogram has been performed.  Arthur [name] 07/12/2017, 1:24 PM

## 2017-07-13 ENCOUNTER — Inpatient Hospital Stay (HOSPITAL_COMMUNITY): Payer: Medicare Other

## 2017-07-13 ENCOUNTER — Other Ambulatory Visit: Payer: Self-pay

## 2017-07-13 ENCOUNTER — Encounter (HOSPITAL_COMMUNITY): Payer: Self-pay | Admitting: Interventional Radiology

## 2017-07-13 DIAGNOSIS — G8114 Spastic hemiplegia affecting left nondominant side: Secondary | ICD-10-CM

## 2017-07-13 DIAGNOSIS — I63131 Cerebral infarction due to embolism of right carotid artery: Secondary | ICD-10-CM

## 2017-07-13 DIAGNOSIS — I10 Essential (primary) hypertension: Secondary | ICD-10-CM

## 2017-07-13 DIAGNOSIS — Z8673 Personal history of transient ischemic attack (TIA), and cerebral infarction without residual deficits: Secondary | ICD-10-CM

## 2017-07-13 LAB — CBC
HCT: 39.3 % (ref 39.0–52.0)
HEMOGLOBIN: 12.8 g/dL — AB (ref 13.0–17.0)
MCH: 29.8 pg (ref 26.0–34.0)
MCHC: 32.6 g/dL (ref 30.0–36.0)
MCV: 91.6 fL (ref 78.0–100.0)
PLATELETS: 195 10*3/uL (ref 150–400)
RBC: 4.29 MIL/uL (ref 4.22–5.81)
RDW: 13.8 % (ref 11.5–15.5)
WBC: 9.5 10*3/uL (ref 4.0–10.5)

## 2017-07-13 LAB — HEMOGLOBIN A1C
HEMOGLOBIN A1C: 5.6 % (ref 4.8–5.6)
MEAN PLASMA GLUCOSE: 114.02 mg/dL

## 2017-07-13 LAB — BASIC METABOLIC PANEL
Anion gap: 10 (ref 5–15)
BUN: 15 mg/dL (ref 6–20)
CALCIUM: 8.5 mg/dL — AB (ref 8.9–10.3)
CO2: 20 mmol/L — ABNORMAL LOW (ref 22–32)
CREATININE: 1.1 mg/dL (ref 0.61–1.24)
Chloride: 110 mmol/L (ref 101–111)
GFR calc non Af Amer: 59 mL/min — ABNORMAL LOW (ref >60)
Glucose, Bld: 107 mg/dL — ABNORMAL HIGH (ref 65–99)
Potassium: 3.4 mmol/L — ABNORMAL LOW (ref 3.5–5.1)
SODIUM: 140 mmol/L (ref 135–145)

## 2017-07-13 MED ORDER — ASPIRIN EC 325 MG PO TBEC
325.0000 mg | DELAYED_RELEASE_TABLET | Freq: Every day | ORAL | Status: DC
  Administered 2017-07-13 – 2017-07-15 (×3): 325 mg via ORAL
  Filled 2017-07-13 (×3): qty 1

## 2017-07-13 MED ORDER — PANTOPRAZOLE SODIUM 40 MG PO TBEC
40.0000 mg | DELAYED_RELEASE_TABLET | Freq: Every day | ORAL | Status: DC
  Administered 2017-07-13 – 2017-07-15 (×3): 40 mg via ORAL
  Filled 2017-07-13 (×3): qty 1

## 2017-07-13 MED ORDER — POLYVINYL ALCOHOL 1.4 % OP SOLN
1.0000 [drp] | Freq: Four times a day (QID) | OPHTHALMIC | Status: DC | PRN
  Administered 2017-07-13: 1 [drp] via OPHTHALMIC
  Filled 2017-07-13: qty 15

## 2017-07-13 MED ORDER — POTASSIUM CHLORIDE CRYS ER 20 MEQ PO TBCR
40.0000 meq | EXTENDED_RELEASE_TABLET | Freq: Once | ORAL | Status: AC
  Administered 2017-07-13: 40 meq via ORAL
  Filled 2017-07-13: qty 2

## 2017-07-13 MED ORDER — ENOXAPARIN SODIUM 40 MG/0.4ML ~~LOC~~ SOLN
40.0000 mg | SUBCUTANEOUS | Status: DC
  Administered 2017-07-13 – 2017-07-15 (×3): 40 mg via SUBCUTANEOUS
  Filled 2017-07-13 (×3): qty 0.4

## 2017-07-13 MED ORDER — RESOURCE THICKENUP CLEAR PO POWD
Freq: Once | ORAL | Status: AC
  Administered 2017-07-13: 18:00:00 via ORAL
  Filled 2017-07-13: qty 125

## 2017-07-13 NOTE — Progress Notes (Signed)
STROKE TEAM PROGRESS NOTE   SUBJECTIVE (INTERVAL HISTORY) His PT therapist is at the bedside. Pt is sitting in chair. Still has left hemiplegia. Moderate dysarthria with left facial droop. Following all simple commands. Left eye bloody secretions, seems to have eyelid small ulcer. Will give eye drops. CT head no bleeding. Started ASA.   OBJECTIVE Temp:  [98.4 F (36.9 C)-99.5 F (37.5 C)] 98.4 F (36.9 C) (01/30 0800) Pulse Rate:  [53-84] 79 (01/30 0800) Cardiac Rhythm: Normal sinus rhythm (01/30 0400) Resp:  [10-24] 22 (01/30 0800) BP: (118-154)/(59-88) 144/65 (01/30 0800) SpO2:  [92 %-99 %] 93 % (01/30 0800) Arterial Line BP: (118-159)/(58-79) 130/79 (01/29 1045) FiO2 (%):  [40 %] 40 % (01/29 1000)  Recent Labs  Lab 07/11/17 1819  GLUCAP 90   Recent Labs  Lab 07/11/17 1820 07/11/17 1826 07/12/17 0359 07/13/17 0350  NA 138 139 138 140  K 4.6 4.6 4.1 3.4*  CL 107 105 110 110  CO2 21*  --  19* 20*  GLUCOSE 111* 107* 138* 107*  BUN 21* 21* 22* 15  CREATININE 1.29* 1.20 1.17 1.10  CALCIUM 9.2  --  8.3* 8.5*   Recent Labs  Lab 07/11/17 1820  AST 25  ALT 14*  ALKPHOS 57  BILITOT 0.9  PROT 6.6  ALBUMIN 4.3   Recent Labs  Lab 07/11/17 1820 07/11/17 1826 07/12/17 0359 07/13/17 0350  WBC 7.2  --  8.4 9.5  NEUTROABS 4.7  --  7.1  --   HGB 14.4 15.0 12.7* 12.8*  HCT 44.1 44.0 38.5* 39.3  MCV 90.9  --  90.8 91.6  PLT 219  --  196 195   No results for input(s): CKTOTAL, CKMB, CKMBINDEX, TROPONINI in the last 168 hours. Recent Labs    07/11/17 1820  LABPROT 17.8*  INR 1.48   Recent Labs    07/12/17 0420  COLORURINE YELLOW  LABSPEC 1.044*  PHURINE 5.0  GLUCOSEU NEGATIVE  HGBUR SMALL*  BILIRUBINUR NEGATIVE  KETONESUR NEGATIVE  PROTEINUR NEGATIVE  NITRITE NEGATIVE  LEUKOCYTESUR SMALL*       Component Value Date/Time   CHOL 122 07/12/2017 0359   TRIG 92 07/12/2017 0359   TRIG 93 07/12/2017 0359   HDL 38 (L) 07/12/2017 0359   CHOLHDL 3.2 07/12/2017  0359   VLDL 18 07/12/2017 0359   LDLCALC 66 07/12/2017 0359   Lab Results  Component Value Date   HGBA1C 5.6 07/13/2017      Component Value Date/Time   LABOPIA NONE DETECTED 07/12/2017 0420   COCAINSCRNUR NONE DETECTED 07/12/2017 0420   LABBENZ NONE DETECTED 07/12/2017 0420   AMPHETMU NONE DETECTED 07/12/2017 0420   THCU NONE DETECTED 07/12/2017 0420   LABBARB NONE DETECTED 07/12/2017 0420    Recent Labs  Lab 07/11/17 1820  ETH <10    I have personally reviewed the radiological images below and agree with the radiology interpretations.  Ct Angio Head W Or Wo Contrast  Result Date: 07/11/2017 CLINICAL DATA:  84 y/o  M; code stroke with left-sided deficits. EXAM: CT ANGIOGRAPHY HEAD AND NECK TECHNIQUE: Multidetector CT imaging of the head and neck was performed using the standard protocol during bolus administration of intravenous contrast. Multiplanar CT image reconstructions and MIPs were obtained to evaluate the vascular anatomy. Carotid stenosis measurements (when applicable) are obtained utilizing NASCET criteria, using the distal internal carotid diameter as the denominator. CONTRAST:  55mL ISOVUE-370 IOPAMIDOL (ISOVUE-370) INJECTION 76% COMPARISON:  07/11/2016 CT head.  05/15/2011 MRA head. FINDINGS: CTA NECK  FINDINGS Aortic arch: Standard branching. Imaged portion shows no evidence of aneurysm or dissection. No significant stenosis of the major arch vessel origins. Moderate calcific atherosclerosis. Status post LIMA and saphenous CABG grafts. Right carotid system: No evidence of dissection, stenosis (50% or greater) or occlusion. Dense calcified plaque of the carotid bifurcation with mild less than 50% distal common carotid and internal carotid artery stenosis. Left carotid system: No evidence of dissection, stenosis (50% or greater) or occlusion. Dense calcified plaque of carotid bifurcation with mild less than 50% proximal ICA stenosis. Calcified plaque of left mid common carotid  artery with minimal less than 30% stenosis. Vertebral arteries: Codominant. No evidence of dissection, stenosis (50% or greater) or occlusion. Right V1 calcified plaque with mild less than 50% stenosis. Skeleton: Severe cervical spondylosis with extensive discogenic degenerative changes greatest at the C5-C7 levels and severe facet hypertrophy. Reversal of cervical curvature with apex at C5. Multiple levels of canal stenosis greatest at the C5-6 level where it is moderate to severe. Uncovertebral and facet hypertrophy narrows the neural foramen greatest at the bilateral C5-6 and C6-7 levels. Other neck: Negative. Upper chest: Negative. Review of the MIP images confirms the above findings CTA HEAD FINDINGS Anterior circulation: Right M2 superior division proximal occlusion (series 7, image 90). Poor collateralization in the right M2 superior division distribution. Left distal M1 short segment of moderate stenosis. Bilateral carotid siphon calcific plaque with moderate bilateral paraclinoid stenosis. Posterior circulation: New focal calcification within the right P4 parietooccipital branch origin (series 8, image 116) with normal downstream opacification of the artery, possibly atherosclerotic or nonocclusive calcified plaque thrombus. Bilateral P1 irregularity with mild stenosis. Venous sinuses: As permitted by contrast timing, patent. Anatomic variants: Anterior communicating artery. No posterior communicating artery identified, likely hypoplastic or absent. Delayed phase: No abnormal intracranial enhancement. Review of the MIP images confirms the above findings IMPRESSION: CTA neck: 1. No dissection, aneurysm, or high-grade stenosis by NASCET criteria. 2. Calcified plaque of aorta and carotid bifurcations with mild less than 50% proximal ICA stenosis bilaterally. 3. Severe cervical spondylosis greatest at C5 and C6 levels. At least moderate C5-6 canal stenosis. CTA head: 1. Right M2 superior division proximal  occlusion with poor downstream collateralization. 2. New calcification within right P 4 parieto-occipital origin, possibly atherosclerotic or nonocclusive calcified plaque thrombus. 3. Intracranial atherosclerosis with multiple areas of mild-to-moderate stenosis. No high-grade stenosis, aneurysm, or additional large vessel occlusion. These results were communicated to Dr. Lorraine Lax at [place] 1/28/2019by text page via the [facility] messaging system. Electronically Signed   By: Kristine Garbe M.D.   On: 07/11/2017 19:05   Ct Head Wo Contrast  Result Date: 07/12/2017 CLINICAL DATA:  24 hour follow-up status post tPA. EXAM: CT HEAD WITHOUT CONTRAST TECHNIQUE: Contiguous axial images were obtained from the base of the skull through the vertex without intravenous contrast. COMPARISON:  Previous MRI from earlier the same day. FINDINGS: Brain: Patchy multifocal ischemic infarcts involving the right MCA and ACA territories as well as the watershed distribution again see, stable from previous MRI. No evidence for hemorrhagic transformation or other complication status post tPA. Otherwise stable appearance of the brain. Small remote left frontal infarct again noted. Remote left cerebellar infarct. Stable atrophy with chronic small vessel ischemic disease. No other acute intracranial hemorrhage or large vessel territory infarct. No mass lesion, midline shift or mass effect. No hydrocephalus. No extra-axial fluid collection. Vascular: No hyperdense vessel. Scattered vascular calcifications noted within the carotid siphons. Skull: Scalp soft tissues and calvarium within  normal limits. Sinuses/Orbits: Globes and orbital soft tissues within normal limits. Air-fluid levels noted within the sphenoid and maxillary sinuses. Scattered mucosal thickening within the ethmoidal sinuses. Mastoids are clear. Other: None. IMPRESSION: 1. Continued interval evolution right MCA and ACA territory infarcts, stable in distribution as  compared to recent MRI. No evidence for hemorrhagic transformation or other complication identified. 2. Otherwise stable appearance of the brain. No other acute intracranial abnormality. 3. Acute maxillary and sphenoid sinusitis. Electronically Signed   By: Jeannine Boga M.D.   On: 07/12/2017 19:27   Ct Head Wo Contrast  Result Date: 07/11/2017 CLINICAL DATA:  84 y/o M; post intra-arterial intervention for stroke. EXAM: CT HEAD WITHOUT CONTRAST TECHNIQUE: Contiguous axial images were obtained from the base of the skull through the vertex without intravenous contrast. COMPARISON:  07/11/2017 CT head and CT angiogram head. FINDINGS: Brain: Interval loss of gray-white differentiation in the right perisylvian frontal and parietal lobes (series 3, image 26 and series 6, image 21) likely representing developing acute infarction. No hemorrhage. Stable small chronic cortical infarcts in the frontal and right parietal lobes, small chronic infarct in left inferior cerebellar hemisphere, mild chronic microvascular ischemic changes, and parenchymal volume loss of the brain. Vascular: Contrast is present within the vascular structures. Skull: Normal. Negative for fracture or focal lesion. Sinuses/Orbits: Persistent partial opacification of left anterior ethmoid and frontal sinuses. Fluid level in the right maxillary sinus. Normal aeration of mastoid air cells. Other: None. IMPRESSION: 1. Interval loss of gray-white differentiation in the right perisylvian frontal and parietal lobes likely representing a developing acute infarction. No hemorrhage. 2. Stable background of chronic microvascular ischemic changes and parenchymal volume loss of the brain. Electronically Signed   By: Kristine Garbe M.D.   On: 07/11/2017 21:50   Ct Angio Neck W And/or Wo Contrast  Result Date: 07/11/2017 CLINICAL DATA:  84 y/o  M; code stroke with left-sided deficits. EXAM: CT ANGIOGRAPHY HEAD AND NECK TECHNIQUE: Multidetector  CT imaging of the head and neck was performed using the standard protocol during bolus administration of intravenous contrast. Multiplanar CT image reconstructions and MIPs were obtained to evaluate the vascular anatomy. Carotid stenosis measurements (when applicable) are obtained utilizing NASCET criteria, using the distal internal carotid diameter as the denominator. CONTRAST:  64mL ISOVUE-370 IOPAMIDOL (ISOVUE-370) INJECTION 76% COMPARISON:  07/11/2016 CT head.  05/15/2011 MRA head. FINDINGS: CTA NECK FINDINGS Aortic arch: Standard branching. Imaged portion shows no evidence of aneurysm or dissection. No significant stenosis of the major arch vessel origins. Moderate calcific atherosclerosis. Status post LIMA and saphenous CABG grafts. Right carotid system: No evidence of dissection, stenosis (50% or greater) or occlusion. Dense calcified plaque of the carotid bifurcation with mild less than 50% distal common carotid and internal carotid artery stenosis. Left carotid system: No evidence of dissection, stenosis (50% or greater) or occlusion. Dense calcified plaque of carotid bifurcation with mild less than 50% proximal ICA stenosis. Calcified plaque of left mid common carotid artery with minimal less than 30% stenosis. Vertebral arteries: Codominant. No evidence of dissection, stenosis (50% or greater) or occlusion. Right V1 calcified plaque with mild less than 50% stenosis. Skeleton: Severe cervical spondylosis with extensive discogenic degenerative changes greatest at the C5-C7 levels and severe facet hypertrophy. Reversal of cervical curvature with apex at C5. Multiple levels of canal stenosis greatest at the C5-6 level where it is moderate to severe. Uncovertebral and facet hypertrophy narrows the neural foramen greatest at the bilateral C5-6 and C6-7 levels. Other neck: Negative. Upper  chest: Negative. Review of the MIP images confirms the above findings CTA HEAD FINDINGS Anterior circulation: Right M2  superior division proximal occlusion (series 7, image 90). Poor collateralization in the right M2 superior division distribution. Left distal M1 short segment of moderate stenosis. Bilateral carotid siphon calcific plaque with moderate bilateral paraclinoid stenosis. Posterior circulation: New focal calcification within the right P4 parietooccipital branch origin (series 8, image 116) with normal downstream opacification of the artery, possibly atherosclerotic or nonocclusive calcified plaque thrombus. Bilateral P1 irregularity with mild stenosis. Venous sinuses: As permitted by contrast timing, patent. Anatomic variants: Anterior communicating artery. No posterior communicating artery identified, likely hypoplastic or absent. Delayed phase: No abnormal intracranial enhancement. Review of the MIP images confirms the above findings IMPRESSION: CTA neck: 1. No dissection, aneurysm, or high-grade stenosis by NASCET criteria. 2. Calcified plaque of aorta and carotid bifurcations with mild less than 50% proximal ICA stenosis bilaterally. 3. Severe cervical spondylosis greatest at C5 and C6 levels. At least moderate C5-6 canal stenosis. CTA head: 1. Right M2 superior division proximal occlusion with poor downstream collateralization. 2. New calcification within right P 4 parieto-occipital origin, possibly atherosclerotic or nonocclusive calcified plaque thrombus. 3. Intracranial atherosclerosis with multiple areas of mild-to-moderate stenosis. No high-grade stenosis, aneurysm, or additional large vessel occlusion. These results were communicated to Dr. Lorraine Lax at [place] 1/28/2019by text page via the [facility] messaging system. Electronically Signed   By: Kristine Garbe M.D.   On: 07/11/2017 19:05   Mr Jodene Nam Head Wo Contrast  Result Date: 07/12/2017 CLINICAL DATA:  LEFT-sided weakness, RIGHT gaze deficit. Status post tPA and complete revascularization of RIGHT ACA occlusion. EXAM: MRI HEAD WITHOUT CONTRAST MRA  HEAD WITHOUT CONTRAST TECHNIQUE: Multiplanar, multiecho pulse sequences of the brain and surrounding structures were obtained without intravenous contrast. Angiographic images of the head were obtained using MRA technique without contrast. COMPARISON:  CT HEAD and CTA HEAD and neck July 11, 2016. MRI of the head May 15, 2011 FINDINGS: MRI HEAD FINDINGS INTRACRANIAL CONTENTS: Confluent RIGHT frontoparietal reduced diffusion including medial cortex with low ADC values. Discontinuous RIGHT frontal and RIGHT basal ganglia reduced diffusion and low ADC values. Subcentimeter reduced diffusion and low ADC values RIGHT posterior temporal lobe. Faint susceptibility artifact RIGHT temporoparietal lobes compatible with petechial hemorrhage. Scattered chronic micro hemorrhages. LEFT cerebellar encephalomalacia. Small area RIGHT temporal occipital and LEFT frontal encephalomalacia. Old small RIGHT cerebellar, RIGHT pontine, RIGHT thalamus and RIGHT basal ganglia to corona radiata infarct. Prominent basal ganglia perivascular spaces associated with chronic small vessel ischemic disease. A few additional subcentimeter supratentorial white matter FLAIR T2 hyperintensities compatible with mild chronic small vessel ischemic disease, less than expected for age. No advanced parenchymal brain volume loss for age. No midline shift, mass effect or masses. No abnormal extra-axial fluid collections. VASCULAR: Normal major intracranial vascular flow voids present at skull base. SKULL AND UPPER CERVICAL SPINE: No abnormal sellar expansion. No suspicious calvarial bone marrow signal. 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 Craniocervical junction maintained. SINUSES/ORBITS: Pan paranasal sinusitis with large RIGHT maxillary sinus air-fluid level. Trace RIGHT mastoid effusion. Included ocular globes and orbital contents are non-suspicious. OTHER: None. MRA HEAD FINDINGS ANTERIOR CIRCULATION: Normal flow  related enhancement of the included cervical, petrous, cavernous and supraclinoid internal carotid arteries. Moderate stenosis bilateral para clinoid internal carotid arteries. Patent anterior communicating artery. Patent anterior and middle cerebral arteries, bilateral anterior cerebral arteries distal segments on included on MRA. No large vessel occlusion, flow limiting stenosis, aneurysm. POSTERIOR CIRCULATION:  Codominant vertebral arteries. Patent vertebrobasilar junction and basilar artery. Main branch vessels not well demonstrated. Severe stenosis RIGHT P1 segment, moderate LEFT P2 stenosis. RIGHT P3 occlusion with thready reconstitution. No  aneurysm. ANATOMIC VARIANTS: None. Source images and MIP images were reviewed. IMPRESSION: MRI head: 1. Acute moderate RIGHT MCA and small RIGHT distal ACA infarcts. Small RIGHT posterior watershed territory infarct. Minimal petechial hemorrhage without hemorrhagic conversion. 2. 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 Multiple small vessel supra-and infratentorial old infarcts. MRA head: 1. Successful revascularization of RIGHT middle cerebral artery occlusion. Distal anterior cerebral artery's not included on typical MRA head. 2. Focally occluded RIGHT P3 segment (possibly overestimated by calcification and resultant artifact) with immediate reconstitution. Severe stenosis RIGHT P1 segment. Electronically Signed   By: Elon Alas M.D.   On: 07/12/2017 02:04   Mr Brain Wo Contrast  Result Date: 07/12/2017 CLINICAL DATA:  LEFT-sided weakness, RIGHT gaze deficit. Status post tPA and complete revascularization of RIGHT ACA occlusion. EXAM: MRI HEAD WITHOUT CONTRAST MRA HEAD WITHOUT CONTRAST TECHNIQUE: Multiplanar, multiecho pulse sequences of the brain and surrounding structures were obtained without intravenous contrast. Angiographic images of the head were obtained  using MRA technique without contrast. COMPARISON:  CT HEAD and CTA HEAD and neck July 11, 2016. MRI of the head May 15, 2011 FINDINGS: MRI HEAD FINDINGS INTRACRANIAL CONTENTS: Confluent RIGHT frontoparietal reduced diffusion including medial cortex with low ADC values. Discontinuous RIGHT frontal and RIGHT basal ganglia reduced diffusion and low ADC values. Subcentimeter reduced diffusion and low ADC values RIGHT posterior temporal lobe. Faint susceptibility artifact RIGHT temporoparietal lobes compatible with petechial hemorrhage. Scattered chronic micro hemorrhages. LEFT cerebellar encephalomalacia. Small area RIGHT temporal occipital and LEFT frontal encephalomalacia. Old small RIGHT cerebellar, RIGHT pontine, RIGHT thalamus and RIGHT basal ganglia to corona radiata infarct. Prominent basal ganglia perivascular spaces associated with chronic small vessel ischemic disease. A few additional subcentimeter supratentorial white matter FLAIR T2 hyperintensities compatible with mild chronic small vessel ischemic disease, less than expected for age. No advanced parenchymal brain volume loss for age. No midline shift, mass effect or masses. No abnormal extra-axial fluid collections. VASCULAR: Normal major intracranial vascular flow voids present at skull base. SKULL AND UPPER CERVICAL SPINE: No abnormal sellar expansion. No suspicious calvarial bone marrow signal. 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 Craniocervical junction maintained. SINUSES/ORBITS: Pan paranasal sinusitis with large RIGHT maxillary sinus air-fluid level. Trace RIGHT mastoid effusion. Included ocular globes and orbital contents are non-suspicious. OTHER: None. MRA HEAD FINDINGS ANTERIOR CIRCULATION: Normal flow related enhancement of the included cervical, petrous, cavernous and supraclinoid internal carotid arteries. Moderate stenosis bilateral para clinoid internal carotid arteries. Patent anterior  communicating artery. Patent anterior and middle cerebral arteries, bilateral anterior cerebral arteries distal segments on included on MRA. No large vessel occlusion, flow limiting stenosis, aneurysm. POSTERIOR CIRCULATION: Codominant vertebral arteries. Patent vertebrobasilar junction and basilar artery. Main branch vessels not well demonstrated. Severe stenosis RIGHT P1 segment, moderate LEFT P2 stenosis. RIGHT P3 occlusion with thready reconstitution. No  aneurysm. ANATOMIC VARIANTS: None. Source images and MIP images were reviewed. IMPRESSION: MRI head: 1. Acute moderate RIGHT MCA and small RIGHT distal ACA infarcts. Small RIGHT posterior watershed territory infarct. Minimal petechial hemorrhage without hemorrhagic conversion. 2. 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 Multiple small vessel supra-and infratentorial old infarcts. MRA head: 1. Successful revascularization of RIGHT middle cerebral artery occlusion.  Distal anterior cerebral artery's not included on typical MRA head. 2. Focally occluded RIGHT P3 segment (possibly overestimated by calcification and resultant artifact) with immediate reconstitution. Severe stenosis RIGHT P1 segment. Electronically Signed   By: Elon Alas M.D.   On: 07/12/2017 02:04   Portable Chest Xray  Result Date: 07/12/2017 CLINICAL DATA:  84 year old male status post right MCA M2 vessel occlusion treated with tPA and revascularization. Intubated. EXAM: PORTABLE CHEST 1 VIEW COMPARISON:  07/11/2017 and earlier. FINDINGS: Portable AP semi upright view at 0617 hours. Stable endotracheal tube tip just below the clavicles. Stable lung volumes. Mediastinal contours remain within normal limits. Calcified aortic atherosclerosis. Prior CABG. Streaky right mid and lower lung opacity with blunting of the right costophrenic angle appears chronic and not significantly changed compared  to 2016. No pneumothorax, pulmonary edema or acute pulmonary opacity. IMPRESSION: 1. Stable endotracheal tube. 2. Chronic opacity in the right lower lung. No acute cardiopulmonary abnormality. Electronically Signed   By: Genevie Ann M.D.   On: 07/12/2017 08:40   Portable Chest X-ray  Result Date: 07/11/2017 CLINICAL DATA:  Respiratory failure EXAM: PORTABLE CHEST 1 VIEW COMPARISON:  Chest x-ray dated 01/13/2015. FINDINGS: Heart size and mediastinal contours are stable in size and configuration. Aortic atherosclerosis. Median sternotomy wires appear intact. Endotracheal tube is adequately positioned with tip approximately 5 cm above the carina. Chronic small pleural effusion and/or pleural thickening at the right lung base. No new lung findings. No pneumothorax seen. IMPRESSION: 1. No active disease.  No evidence of pneumonia or pulmonary edema. 2. Chronic small right pleural effusion and/or pleural thickening, stable. 3. Aortic atherosclerosis. Electronically Signed   By: Franki Cabot M.D.   On: 07/11/2017 22:27   Ct Head Code Stroke Wo Contrast  Result Date: 07/11/2017 CLINICAL DATA:  Code stroke. 84 y/o M; left-sided deficits and right-sided gaze. EXAM: CT HEAD WITHOUT CONTRAST TECHNIQUE: Contiguous axial images were obtained from the base of the skull through the vertex without intravenous contrast. COMPARISON:  05/13/2011 CT head. FINDINGS: Brain: Stable small chronic cortical infarcts in left frontal and right parietal lobes. Stable small chronic lacunar infarct in the right caudate body and right thalamus. Chronic infarction in the left inferior cerebellar hemisphere. Background of mild chronic microvascular ischemic changes and parenchymal volume loss of the brain. Vascular: Calcific atherosclerosis of carotid siphons. Hyperdensity within right distal M1 (series 6, image 21). Skull: Normal. Negative for fracture or focal lesion. Sinuses/Orbits: Left anterior ethmoid and frontal sinus partial  opacification in chronic inflammatory changes. Right intra-ocular lens replacement. Other: None. ASPECTS [facility] Stroke Program Early CT Score) - Ganglionic level infarction (caudate, lentiform nuclei, internal capsule, insula, M1-M3 cortex): 7 - Supraganglionic infarction (M4-M6 cortex): 3 Total score (0-10 with 10 being normal): 10 IMPRESSION: 1. No acute intracranial abnormality identified. 2. ASPECTS is 10 3. Dense distal right M1, suspected thrombus. 4. Stable chronic microvascular ischemic changes, parenchymal volume loss, and small cortical infarctions. These results were called by telephone at the time of interpretation on 07/11/2017 at 6:43 pm to Dr. Lorraine Lax, who verbally acknowledged these results. Electronically Signed   By: Kristine Garbe M.D.   On: 07/11/2017 18:43   TTE  - Left ventricle: The cavity size was normal. Systolic function was   normal. The estimated ejection fraction was in the range of 55%   to 60%. Wall motion was normal; there were no regional wall   motion abnormalities. Features are consistent with a pseudonormal   left ventricular filling pattern, with concomitant abnormal  relaxation and increased filling pressure (grade 2 diastolic   dysfunction). - Aortic valve: Transvalvular velocity was within the normal range.   There was no stenosis. There was no regurgitation. - Aorta: Ascending aortic diameter: 38 mm (S). - Ascending aorta: The ascending aorta was mildly dilated. - Mitral valve: Prior procedures included surgical repair.   Transvalvular velocity was within the normal range. There was no   evidence for stenosis. There was mild regurgitation. Valve area   by pressure half-time: 2.29 cm^2. - Left atrium: The atrium was moderately dilated. - Right ventricle: The cavity size was normal. Wall thickness was   normal. Systolic function was normal. - Atrial septum: No defect or patent foramen ovale was identified. - Tricuspid valve: There was mild  regurgitation. - Pulmonary arteries: Systolic pressure was severely increased. PA   peak pressure: 56 mm Hg (S).   PHYSICAL EXAM  Temp:  [98.4 F (36.9 C)-99.5 F (37.5 C)] 98.4 F (36.9 C) (01/30 0800) Pulse Rate:  [53-84] 79 (01/30 0800) Resp:  [10-24] 22 (01/30 0800) BP: (118-154)/(59-88) 144/65 (01/30 0800) SpO2:  [92 %-99 %] 93 % (01/30 0800) Arterial Line BP: (118-159)/(58-79) 130/79 (01/29 1045) FiO2 (%):  [40 %] 40 % (01/29 1000)  General - Well nourished, well developed, not in acute distress.  Ophthalmologic - fundi not visualized due to noncooperation.  Cardiovascular - irregularly irregular heart rate and rhythm.  Neuro - awake alert, sitting in chair. Able to follow simple commands, moderate dysarthria, paucity of speech, able to name 3/4 and repeat simple sentences. Left eyelid mild ptosis with bloody secretions. PERRL, right gaze preference, barely cross midline to left. Blinking to visual threat on the right but not on the left. Facial symmetry difficult to exam due to ET tube. RUE and RLE 5/5, spontaneous purposeful movement. LUE and LLE hemiplegic. On pain stimulation, mild withdraw on the LLE. DTR 1+ and no babinski. Sensation, coordination and gait not tested.   ASSESSMENT/PLAN Arthur [name] is a 84 y.o. 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 TPA given    Stroke:  right MCA and ACA patchy scattered moderate to large infarct s/p IV and IA tPA, embolic secondary to afib on coumadin with subtherapeutic INR  Resultant left hemiplegia, right gaze preference  CTA head and neck - right M2 cut off, b/l ICA proximal 50% stenosis  DSA right M2 recanalized after IV tPA, right distal ACA recanalized after IA tPA  MRI  Right MCA and ACA acute infarcts, old left large cerebellar  infarct  MRA  Right MCA recanalized after IR  2D Echo EF 55-60%  LDL 66  HgbA1c 5.6  lovenox for VTE prophylaxis   warfarin daily prior to admission, now on ASA 325 mg.   Ongoing aggressive stroke risk factor management  Therapy recommendations:  CIR  Disposition:  Pending  Afib on coumadin  INR 1.48 on admission  Follows with Dr. Martinique cardiology  Hold off [facility] for now due to moderate to large right MCA and ACA infarcts  Rate under control  Will consider resume AC in 5-7 days  Will consider NOACs, will discuss with Dr. Martinique.   History of stroke  05/2011 left large cerebellar infarct  Concerning for cardiac source  Put on DAPT at that time  recommended outpt TEE and long term  heart monitoring at that time  Hypertension Stable  Long term BP goal normotensive  Hyperlipidemia  Home meds:  crestor   LDL 66, goal < 70  Resumed crestor 20  Continue statin at discharge  Other Stroke Risk Factors  Advanced age  Coronary artery disease s/p CABG  LAA thrombus S/p MAZE  Other Active Problems    Hospital day # 2  This patient is critically ill due to right MCA and ACA Infarcts s/p tPA, and IA tPA, afib on coumadin and at significant risk of neurological worsening, death form recurrent stroke, hemorrhagic conversion, heart failure, seizure. This patient's care requires constant monitoring of vital signs, hemodynamics, respiratory and cardiac monitoring, review of multiple databases, neurological assessment, discussion with family, other specialists and medical decision making of high complexity. I had long discussion with Arthur [name] at bedside, updated pt current condition, treatment plan and potential prognosis. She expressed understanding and appreciation. Also discussed with Dr. Nils Pyle in the hallway.  I spent 35 minutes of neurocritical care time in the care of this patient.   Rosalin Hawking, MD PhD Stroke Neurology 07/13/2017 8:39 AM    To contact  Stroke Continuity provider, please refer to [email or web address] After hours, contact General Neurology

## 2017-07-13 NOTE — Progress Notes (Signed)
Physical Therapy Treatment Patient Details Name: Arthur [name] MRN: [id number] DOB: 01-14-1929 Today's Date: 07/13/2017    History of Present Illness 84 y.o.malewith hx of afib, LAA thrombus s/p MAZE procedure, CAD s/p CABG, CVA in 05/2011 with left large cerebellar infarct, HTN, HLDadmitted for left hemiplegia and right gaze, left neglect and left facial droop.TPA given. Work up revealed rightMCA and ACA patchy scattered moderate to largeinfarct s/p IV and IA    PT Comments    Patient seen for therapeutic intervention. Patient tolerated bed level neuromuscular facilitation activities with PROM for tone management with some success (decreased extensor tone noted this session). Then tolerated EOB activity with modest improvements in sitting balance/decreased extensor tone in trunk/LEs. Concluded session with OOB to chair transfer. Patient tolerated well and was eager to progress. Will continue to see and progress as tolerated. OF NOTE: VSS throughout session but patient with audible congestion/ wet voice sounds throughout session. Will continue to see and progress as tolerated.   Follow Up Recommendations  CIR     Equipment Recommendations  (TBD)    Recommendations for Other Services Rehab consult     Precautions / Restrictions Precautions Precautions: Fall Restrictions Weight Bearing Restrictions: No    Mobility  Bed Mobility Overal bed mobility: Needs Assistance Bed Mobility: Rolling;Sidelying to Sit;Supine to Sit Rolling: Max assist;+2 for physical assistance Sidelying to sit: Max assist;+2 for physical assistance       General bed mobility comments: max assist to break tone for LEs and come to EOB, continued assist to elevate trunk to upright at EOB  Transfers Overall transfer level: Needs assistance Equipment used: 2 person hand held assist(face to face  with gait belt and chuck pad) Transfers: Sit to/from Stand;Stand Pivot Transfers Sit to Stand: Max assist;+2  physical assistance Stand pivot transfers: Max assist;+2 physical assistance       General transfer comment: +2 max assist to power to standing with face to face and gait belt. LLE positioned prior to initiation of mevement, noted LLE clonus requiring manual block out. Use of extensor tone to facilitate power up. Utilized extensor synergy to pivot to chair.   Ambulation/Gait                 Stairs            Wheelchair Mobility    Modified Rankin (Stroke Patients Only) Modified Rankin (Stroke Patients Only) Pre-Morbid Rankin Score: No symptoms Modified Rankin: Severe disability     Balance Overall balance assessment: Needs assistance Sitting-balance support: Single extremity supported;Feet supported Sitting balance-Leahy Scale: Poor Sitting balance - Comments: patient continues to show posterior bias but less extensor tone noted this session Postural control: Posterior lean   Standing balance-Leahy Scale: Zero                              Cognition Arousal/Alertness: Awake/alert Behavior During Therapy: WFL for tasks assessed/performed Overall Cognitive Status: Impaired/Different from baseline Area of Impairment: Following commands;Safety/judgement;Awareness;Problem solving;Attention                   Current Attention Level: Sustained   Following Commands: Follows one step commands with increased time Safety/Judgement: Decreased awareness of deficits Awareness: Intellectual Problem Solving: Requires verbal cues;Requires tactile cues;Slow processing General Comments: patient continues to have some decreased intiation and slow processing during session. Remains conversational throughout session although language appears to require increased effort.       Exercises Other  Exercises Other Exercises: performed D1 LE PNF PROM for synergistic isolation  Other Exercises: AAROM Proximal strengthening LE    General Comments         Pertinent Vitals/Pain      Home Living                      Prior Function            PT Goals (current goals can now be found in the care plan section) Acute Rehab PT Goals Patient Stated Goal: none stated PT Goal Formulation: With patient/family Time For Goal Achievement: 07/26/17 Potential to Achieve Goals: Fair Progress towards PT goals: Progressing toward goals    Frequency    Min 4X/week      PT Plan Current plan remains appropriate    Co-evaluation              AM-PAC PT "6 Clicks" Daily Activity  Outcome Measure  Difficulty turning over in bed (including adjusting bedclothes, sheets and blankets)?: Unable Difficulty moving from lying on back to sitting on the side of the bed? : Unable Difficulty sitting down on and standing up from a chair with arms (e.g., wheelchair, bedside commode, etc,.)?: Unable Help needed moving to and from a bed to chair (including a wheelchair)?: Total Help needed walking in hospital room?: Total Help needed climbing 3-5 steps with a railing? : Total 6 Click Score: 6    End of Session Equipment Utilized During Treatment: Gait belt Activity Tolerance: Patient tolerated treatment well Patient left: in chair;with call bell/phone within reach Nurse Communication: Mobility status PT Visit Diagnosis: Other symptoms and signs involving the nervous system (R29.898);Hemiplegia and hemiparesis Hemiplegia - Right/Left: Left Hemiplegia - dominant/non-dominant: Dominant Hemiplegia - caused by: Cerebral infarction     Time: 6789-3810 PT Time Calculation (min) (ACUTE ONLY): 24 min  Charges:  $Therapeutic Activity: 23-37 mins                    G Codes:       Alben Deeds, PT DPT  Board Certified Neurologic Specialist [phone]    Duncan Dull 07/13/2017, 9:33 AM

## 2017-07-13 NOTE — Progress Notes (Signed)
Referring Physician(s): Dr. Erlinda Hong  Supervising Physician: Luanne Bras  Patient Status:  [facility] - In-pt  Chief Complaint: ACA revascularization   Subjective: Patient follows commands.   Little to no movement on left.  Very sleep during visit today.  Answered questions for wife at bedside.   Allergies: Tape; Lasix [furosemide]; Penicillins; and Sulfa drugs cross reactors  Medications: Prior to Admission medications   Medication Sig Start Date End Date Taking? Authorizing Provider  acetaminophen (TYLENOL) 650 MG CR tablet Take 650 mg by mouth daily as needed (for knee pain).   Yes [provider]  amLODipine (NORVASC) 5 MG tablet TAKE ONE TABLET BY MOUTH DAILY Patient taking differently: Take 5 mg by mouth once a day 06/23/17  Yes Martinique, Peter M, MD  benazepril (LOTENSIN) 40 MG tablet TAKE ONE TABLET BY MOUTH DAILY Patient taking differently: Take 40 mg by mouth once a day 06/21/17  Yes [place], Peter M, MD  carvedilol (COREG) 25 MG tablet TAKE ONE TABLET BY MOUTH TWICE A DAY Patient taking differently: Take 25 mg by mouth two times a day 04/20/17  Yes [place], Peter M, MD  NITROSTAT 0.4 MG SL tablet PLACE 1 TABLET (0.4 MG TOTAL) UNDER THE TONGUE EVERY 5 (FIVE) MINUTESAS NEEDED FOR CHEST PAIN (UP TO 3 DOSES). 12/13/16  Yes [place], Peter M, MD  ranitidine (ZANTAC) 150 MG tablet TAKE ONE TABLET BY MOUTH TWICE A DAY Patient taking differently: Take 150 mg by mouth two times a day 12/28/16  Yes [place], Peter M, MD  rosuvastatin (CRESTOR) 20 MG tablet TAKE 1 TABLET BY MOUTH DAILY Patient taking differently: Take 20 mg by mouth at bedtime 01/05/17  Yes [place], Peter M, MD  warfarin (COUMADIN) 2.5 MG tablet TAKE 1/2 TO 1 TABLET BY MOUTH DAILY AS DIRECTED BY COUMADIN CLINIC Patient taking differently: Take 1.25 mg by mouth at bedtime on Sun/Tues/Thurs/Sat and 2.5 mg on Mon/Wed/Fri 07/09/16  Yes [place], Peter M, MD  warfarin (COUMADIN) 2.5 MG tablet TAKE 1/2 TO 1 TABLET BY MOUTH DAILY AS  DIRECTED BY COUMADIN CLINIC Patient not taking: Reported on 07/11/2017 05/24/17   [place], Peter M, MD     Vital Signs: BP (!) 144/65 (BP Location: Left Arm)   Pulse 79   Temp (!) 100.4 F (38 C) (Axillary)   Resp (!) 22   Ht 5\' 8"  (1.727 m)   Wt 176 lb 12.9 oz (80.2 kg)   SpO2 93%   BMI 26.88 kg/m   Physical Exam  NAD, alert Neuro:  Opens eyes and follows commands, fatigued. Movement in the right bilateral extremities, still little to no movement in left extremities.   Imaging: Ct Angio Head W Or Wo Contrast  Result Date: 07/11/2017 CLINICAL DATA:  84 y/o  M; code stroke with left-sided deficits. EXAM: CT ANGIOGRAPHY HEAD AND NECK TECHNIQUE: Multidetector CT imaging of the head and neck was performed using the standard protocol during bolus administration of intravenous contrast. Multiplanar CT image reconstructions and MIPs were obtained to evaluate the vascular anatomy. Carotid stenosis measurements (when applicable) are obtained utilizing NASCET criteria, using the distal internal carotid diameter as the denominator. CONTRAST:  64mL ISOVUE-370 IOPAMIDOL (ISOVUE-370) INJECTION 76% COMPARISON:  07/11/2016 CT head.  05/15/2011 MRA head. FINDINGS: CTA NECK FINDINGS Aortic arch: Standard branching. Imaged portion shows no evidence of aneurysm or dissection. No significant stenosis of the major arch vessel origins. Moderate calcific atherosclerosis. Status post LIMA and saphenous CABG grafts. Right carotid system: No evidence of dissection, stenosis (50%  or greater) or occlusion. Dense calcified plaque of the carotid bifurcation with mild less than 50% distal common carotid and internal carotid artery stenosis. Left carotid system: No evidence of dissection, stenosis (50% or greater) or occlusion. Dense calcified plaque of carotid bifurcation with mild less than 50% proximal ICA stenosis. Calcified plaque of left mid common carotid artery with minimal less than 30% stenosis. Vertebral arteries:  Codominant. No evidence of dissection, stenosis (50% or greater) or occlusion. Right V1 calcified plaque with mild less than 50% stenosis. Skeleton: Severe cervical spondylosis with extensive discogenic degenerative changes greatest at the C5-C7 levels and severe facet hypertrophy. Reversal of cervical curvature with apex at C5. Multiple levels of canal stenosis greatest at the C5-6 level where it is moderate to severe. Uncovertebral and facet hypertrophy narrows the neural foramen greatest at the bilateral C5-6 and C6-7 levels. Other neck: Negative. Upper chest: Negative. Review of the MIP images confirms the above findings CTA HEAD FINDINGS Anterior circulation: Right M2 superior division proximal occlusion (series 7, image 90). Poor collateralization in the right M2 superior division distribution. Left distal M1 short segment of moderate stenosis. Bilateral carotid siphon calcific plaque with moderate bilateral paraclinoid stenosis. Posterior circulation: New focal calcification within the right P4 parietooccipital branch origin (series 8, image 116) with normal downstream opacification of the artery, possibly atherosclerotic or nonocclusive calcified plaque thrombus. Bilateral P1 irregularity with mild stenosis. Venous sinuses: As permitted by contrast timing, patent. Anatomic variants: Anterior communicating artery. No posterior communicating artery identified, likely hypoplastic or absent. Delayed phase: No abnormal intracranial enhancement. Review of the MIP images confirms the above findings IMPRESSION: CTA neck: 1. No dissection, aneurysm, or high-grade stenosis by NASCET criteria. 2. Calcified plaque of aorta and carotid bifurcations with mild less than 50% proximal ICA stenosis bilaterally. 3. Severe cervical spondylosis greatest at C5 and C6 levels. At least moderate C5-6 canal stenosis. CTA head: 1. Right M2 superior division proximal occlusion with poor downstream collateralization. 2. New  calcification within right P 4 parieto-occipital origin, possibly atherosclerotic or nonocclusive calcified plaque thrombus. 3. Intracranial atherosclerosis with multiple areas of mild-to-moderate stenosis. No high-grade stenosis, aneurysm, or additional large vessel occlusion. These results were communicated to Dr. Lorraine Lax at [place] 1/28/2019by text page via the [facility] messaging system. Electronically Signed   By: Kristine Garbe M.D.   On: 07/11/2017 19:05   Ct Head Wo Contrast  Result Date: 07/12/2017 CLINICAL DATA:  24 hour follow-up status post tPA. EXAM: CT HEAD WITHOUT CONTRAST TECHNIQUE: Contiguous axial images were obtained from the base of the skull through the vertex without intravenous contrast. COMPARISON:  Previous MRI from earlier the same day. FINDINGS: Brain: Patchy multifocal ischemic infarcts involving the right MCA and ACA territories as well as the watershed distribution again see, stable from previous MRI. No evidence for hemorrhagic transformation or other complication status post tPA. Otherwise stable appearance of the brain. Small remote left frontal infarct again noted. Remote left cerebellar infarct. Stable atrophy with chronic small vessel ischemic disease. No other acute intracranial hemorrhage or large vessel territory infarct. No mass lesion, midline shift or mass effect. No hydrocephalus. No extra-axial fluid collection. Vascular: No hyperdense vessel. Scattered vascular calcifications noted within the carotid siphons. Skull: Scalp soft tissues and calvarium within normal limits. Sinuses/Orbits: Globes and orbital soft tissues within normal limits. Air-fluid levels noted within the sphenoid and maxillary sinuses. Scattered mucosal thickening within the ethmoidal sinuses. Mastoids are clear. Other: None. IMPRESSION: 1. Continued interval evolution right MCA and ACA territory  infarcts, stable in distribution as compared to recent MRI. No evidence for hemorrhagic  transformation or other complication identified. 2. Otherwise stable appearance of the brain. No other acute intracranial abnormality. 3. Acute maxillary and sphenoid sinusitis. Electronically Signed   By: Jeannine Boga M.D.   On: 07/12/2017 19:27   Ct Head Wo Contrast  Result Date: 07/11/2017 CLINICAL DATA:  84 y/o M; post intra-arterial intervention for stroke. EXAM: CT HEAD WITHOUT CONTRAST TECHNIQUE: Contiguous axial images were obtained from the base of the skull through the vertex without intravenous contrast. COMPARISON:  07/11/2017 CT head and CT angiogram head. FINDINGS: Brain: Interval loss of gray-white differentiation in the right perisylvian frontal and parietal lobes (series 3, image 26 and series 6, image 21) likely representing developing acute infarction. No hemorrhage. Stable small chronic cortical infarcts in the frontal and right parietal lobes, small chronic infarct in left inferior cerebellar hemisphere, mild chronic microvascular ischemic changes, and parenchymal volume loss of the brain. Vascular: Contrast is present within the vascular structures. Skull: Normal. Negative for fracture or focal lesion. Sinuses/Orbits: Persistent partial opacification of left anterior ethmoid and frontal sinuses. Fluid level in the right maxillary sinus. Normal aeration of mastoid air cells. Other: None. IMPRESSION: 1. Interval loss of gray-white differentiation in the right perisylvian frontal and parietal lobes likely representing a developing acute infarction. No hemorrhage. 2. Stable background of chronic microvascular ischemic changes and parenchymal volume loss of the brain. Electronically Signed   By: Kristine Garbe M.D.   On: 07/11/2017 21:50   Ct Angio Neck W And/or Wo Contrast  Result Date: 07/11/2017 CLINICAL DATA:  84 y/o  M; code stroke with left-sided deficits. EXAM: CT ANGIOGRAPHY HEAD AND NECK TECHNIQUE: Multidetector CT imaging of the head and neck was performed using  the standard protocol during bolus administration of intravenous contrast. Multiplanar CT image reconstructions and MIPs were obtained to evaluate the vascular anatomy. Carotid stenosis measurements (when applicable) are obtained utilizing NASCET criteria, using the distal internal carotid diameter as the denominator. CONTRAST:  13mL ISOVUE-370 IOPAMIDOL (ISOVUE-370) INJECTION 76% COMPARISON:  07/11/2016 CT head.  05/15/2011 MRA head. FINDINGS: CTA NECK FINDINGS Aortic arch: Standard branching. Imaged portion shows no evidence of aneurysm or dissection. No significant stenosis of the major arch vessel origins. Moderate calcific atherosclerosis. Status post LIMA and saphenous CABG grafts. Right carotid system: No evidence of dissection, stenosis (50% or greater) or occlusion. Dense calcified plaque of the carotid bifurcation with mild less than 50% distal common carotid and internal carotid artery stenosis. Left carotid system: No evidence of dissection, stenosis (50% or greater) or occlusion. Dense calcified plaque of carotid bifurcation with mild less than 50% proximal ICA stenosis. Calcified plaque of left mid common carotid artery with minimal less than 30% stenosis. Vertebral arteries: Codominant. No evidence of dissection, stenosis (50% or greater) or occlusion. Right V1 calcified plaque with mild less than 50% stenosis. Skeleton: Severe cervical spondylosis with extensive discogenic degenerative changes greatest at the C5-C7 levels and severe facet hypertrophy. Reversal of cervical curvature with apex at C5. Multiple levels of canal stenosis greatest at the C5-6 level where it is moderate to severe. Uncovertebral and facet hypertrophy narrows the neural foramen greatest at the bilateral C5-6 and C6-7 levels. Other neck: Negative. Upper chest: Negative. Review of the MIP images confirms the above findings CTA HEAD FINDINGS Anterior circulation: Right M2 superior division proximal occlusion (series 7, image 90).  Poor collateralization in the right M2 superior division distribution. Left distal M1 short segment of  moderate stenosis. Bilateral carotid siphon calcific plaque with moderate bilateral paraclinoid stenosis. Posterior circulation: New focal calcification within the right P4 parietooccipital branch origin (series 8, image 116) with normal downstream opacification of the artery, possibly atherosclerotic or nonocclusive calcified plaque thrombus. Bilateral P1 irregularity with mild stenosis. Venous sinuses: As permitted by contrast timing, patent. Anatomic variants: Anterior communicating artery. No posterior communicating artery identified, likely hypoplastic or absent. Delayed phase: No abnormal intracranial enhancement. Review of the MIP images confirms the above findings IMPRESSION: CTA neck: 1. No dissection, aneurysm, or high-grade stenosis by NASCET criteria. 2. Calcified plaque of aorta and carotid bifurcations with mild less than 50% proximal ICA stenosis bilaterally. 3. Severe cervical spondylosis greatest at C5 and C6 levels. At least moderate C5-6 canal stenosis. CTA head: 1. Right M2 superior division proximal occlusion with poor downstream collateralization. 2. New calcification within right P 4 parieto-occipital origin, possibly atherosclerotic or nonocclusive calcified plaque thrombus. 3. Intracranial atherosclerosis with multiple areas of mild-to-moderate stenosis. No high-grade stenosis, aneurysm, or additional large vessel occlusion. These results were communicated to Dr. Lorraine Lax at [place] 1/28/2019by text page via the [facility] messaging system. Electronically Signed   By: Kristine Garbe M.D.   On: 07/11/2017 19:05   Mr Jodene Nam Head Wo Contrast  Result Date: 07/12/2017 CLINICAL DATA:  LEFT-sided weakness, RIGHT gaze deficit. Status post tPA and complete revascularization of RIGHT ACA occlusion. EXAM: MRI HEAD WITHOUT CONTRAST MRA HEAD WITHOUT CONTRAST TECHNIQUE: Multiplanar, multiecho pulse  sequences of the brain and surrounding structures were obtained without intravenous contrast. Angiographic images of the head were obtained using MRA technique without contrast. COMPARISON:  CT HEAD and CTA HEAD and neck July 11, 2016. MRI of the head May 15, 2011 FINDINGS: MRI HEAD FINDINGS INTRACRANIAL CONTENTS: Confluent RIGHT frontoparietal reduced diffusion including medial cortex with low ADC values. Discontinuous RIGHT frontal and RIGHT basal ganglia reduced diffusion and low ADC values. Subcentimeter reduced diffusion and low ADC values RIGHT posterior temporal lobe. Faint susceptibility artifact RIGHT temporoparietal lobes compatible with petechial hemorrhage. Scattered chronic micro hemorrhages. LEFT cerebellar encephalomalacia. Small area RIGHT temporal occipital and LEFT frontal encephalomalacia. Old small RIGHT cerebellar, RIGHT pontine, RIGHT thalamus and RIGHT basal ganglia to corona radiata infarct. Prominent basal ganglia perivascular spaces associated with chronic small vessel ischemic disease. A few additional subcentimeter supratentorial white matter FLAIR T2 hyperintensities compatible with mild chronic small vessel ischemic disease, less than expected for age. No advanced parenchymal brain volume loss for age. No midline shift, mass effect or masses. No abnormal extra-axial fluid collections. VASCULAR: Normal major intracranial vascular flow voids present at skull base. SKULL AND UPPER CERVICAL SPINE: No abnormal sellar expansion. No suspicious calvarial bone marrow signal. 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 Craniocervical junction maintained. SINUSES/ORBITS: Pan paranasal sinusitis with large RIGHT maxillary sinus air-fluid level. Trace RIGHT mastoid effusion. Included ocular globes and orbital contents are non-suspicious. OTHER: None. MRA HEAD FINDINGS ANTERIOR CIRCULATION: Normal flow related enhancement of the included cervical, petrous, cavernous  and supraclinoid internal carotid arteries. Moderate stenosis bilateral para clinoid internal carotid arteries. Patent anterior communicating artery. Patent anterior and middle cerebral arteries, bilateral anterior cerebral arteries distal segments on included on MRA. No large vessel occlusion, flow limiting stenosis, aneurysm. POSTERIOR CIRCULATION: Codominant vertebral arteries. Patent vertebrobasilar junction and basilar artery. Main branch vessels not well demonstrated. Severe stenosis RIGHT P1 segment, moderate LEFT P2 stenosis. RIGHT P3 occlusion with thready reconstitution. No  aneurysm. ANATOMIC VARIANTS: None. Source images and MIP images were  reviewed. IMPRESSION: MRI head: 1. Acute moderate RIGHT MCA and small RIGHT distal ACA infarcts. Small RIGHT posterior watershed territory infarct. Minimal petechial hemorrhage without hemorrhagic conversion. 2. 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 Multiple small vessel supra-and infratentorial old infarcts. MRA head: 1. Successful revascularization of RIGHT middle cerebral artery occlusion. Distal anterior cerebral artery's not included on typical MRA head. 2. Focally occluded RIGHT P3 segment (possibly overestimated by calcification and resultant artifact) with immediate reconstitution. Severe stenosis RIGHT P1 segment. Electronically Signed   By: Elon Alas M.D.   On: 07/12/2017 02:04   Mr Brain Wo Contrast  Result Date: 07/12/2017 CLINICAL DATA:  LEFT-sided weakness, RIGHT gaze deficit. Status post tPA and complete revascularization of RIGHT ACA occlusion. EXAM: MRI HEAD WITHOUT CONTRAST MRA HEAD WITHOUT CONTRAST TECHNIQUE: Multiplanar, multiecho pulse sequences of the brain and surrounding structures were obtained without intravenous contrast. Angiographic images of the head were obtained using MRA technique without contrast. COMPARISON:  CT HEAD and  CTA HEAD and neck July 11, 2016. MRI of the head May 15, 2011 FINDINGS: MRI HEAD FINDINGS INTRACRANIAL CONTENTS: Confluent RIGHT frontoparietal reduced diffusion including medial cortex with low ADC values. Discontinuous RIGHT frontal and RIGHT basal ganglia reduced diffusion and low ADC values. Subcentimeter reduced diffusion and low ADC values RIGHT posterior temporal lobe. Faint susceptibility artifact RIGHT temporoparietal lobes compatible with petechial hemorrhage. Scattered chronic micro hemorrhages. LEFT cerebellar encephalomalacia. Small area RIGHT temporal occipital and LEFT frontal encephalomalacia. Old small RIGHT cerebellar, RIGHT pontine, RIGHT thalamus and RIGHT basal ganglia to corona radiata infarct. Prominent basal ganglia perivascular spaces associated with chronic small vessel ischemic disease. A few additional subcentimeter supratentorial white matter FLAIR T2 hyperintensities compatible with mild chronic small vessel ischemic disease, less than expected for age. No advanced parenchymal brain volume loss for age. No midline shift, mass effect or masses. No abnormal extra-axial fluid collections. VASCULAR: Normal major intracranial vascular flow voids present at skull base. SKULL AND UPPER CERVICAL SPINE: No abnormal sellar expansion. No suspicious calvarial bone marrow signal. 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 Craniocervical junction maintained. SINUSES/ORBITS: Pan paranasal sinusitis with large RIGHT maxillary sinus air-fluid level. Trace RIGHT mastoid effusion. Included ocular globes and orbital contents are non-suspicious. OTHER: None. MRA HEAD FINDINGS ANTERIOR CIRCULATION: Normal flow related enhancement of the included cervical, petrous, cavernous and supraclinoid internal carotid arteries. Moderate stenosis bilateral para clinoid internal carotid arteries. Patent anterior communicating artery. Patent anterior and middle cerebral arteries,  bilateral anterior cerebral arteries distal segments on included on MRA. No large vessel occlusion, flow limiting stenosis, aneurysm. POSTERIOR CIRCULATION: Codominant vertebral arteries. Patent vertebrobasilar junction and basilar artery. Main branch vessels not well demonstrated. Severe stenosis RIGHT P1 segment, moderate LEFT P2 stenosis. RIGHT P3 occlusion with thready reconstitution. No  aneurysm. ANATOMIC VARIANTS: None. Source images and MIP images were reviewed. IMPRESSION: MRI head: 1. Acute moderate RIGHT MCA and small RIGHT distal ACA infarcts. Small RIGHT posterior watershed territory infarct. Minimal petechial hemorrhage without hemorrhagic conversion. 2. 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 Multiple small vessel supra-and infratentorial old infarcts. MRA head: 1. Successful revascularization of RIGHT middle cerebral artery occlusion. Distal anterior cerebral artery's not included on typical MRA head. 2. Focally occluded RIGHT P3 segment (possibly overestimated by calcification and resultant artifact) with immediate reconstitution. Severe stenosis RIGHT P1 segment. Electronically Signed   By: Thana Farr.D.  On: 07/12/2017 02:04   Dg Chest Port 1 View  Result Date: 07/13/2017 CLINICAL DATA:  Respiratory failure. EXAM: PORTABLE CHEST 1 VIEW COMPARISON:  Yesterday FINDINGS: Extubation with stable lung volumes. There is streaky opacities at the bases which could reflect atelectasis or infection. Stable cardiomegaly. Status post CABG and mitral valve repair. Lower mediastinal widening from hiatal hernia noted since a 2006 chest CT. IMPRESSION: 1. Low volumes but stable inflation after extubation. 2. Atelectasis or bronchopneumonia at the bases. Electronically Signed   By: Monte Fantasia M.D.   On: 07/13/2017 09:35   Portable Chest Xray  Result Date: 07/12/2017 CLINICAL DATA:  84 year old  male status post right MCA M2 vessel occlusion treated with tPA and revascularization. Intubated. EXAM: PORTABLE CHEST 1 VIEW COMPARISON:  07/11/2017 and earlier. FINDINGS: Portable AP semi upright view at 0617 hours. Stable endotracheal tube tip just below the clavicles. Stable lung volumes. Mediastinal contours remain within normal limits. Calcified aortic atherosclerosis. Prior CABG. Streaky right mid and lower lung opacity with blunting of the right costophrenic angle appears chronic and not significantly changed compared to 2016. No pneumothorax, pulmonary edema or acute pulmonary opacity. IMPRESSION: 1. Stable endotracheal tube. 2. Chronic opacity in the right lower lung. No acute cardiopulmonary abnormality. Electronically Signed   By: Genevie Ann M.D.   On: 07/12/2017 08:40   Portable Chest X-ray  Result Date: 07/11/2017 CLINICAL DATA:  Respiratory failure EXAM: PORTABLE CHEST 1 VIEW COMPARISON:  Chest x-ray dated 01/13/2015. FINDINGS: Heart size and mediastinal contours are stable in size and configuration. Aortic atherosclerosis. Median sternotomy wires appear intact. Endotracheal tube is adequately positioned with tip approximately 5 cm above the carina. Chronic small pleural effusion and/or pleural thickening at the right lung base. No new lung findings. No pneumothorax seen. IMPRESSION: 1. No active disease.  No evidence of pneumonia or pulmonary edema. 2. Chronic small right pleural effusion and/or pleural thickening, stable. 3. Aortic atherosclerosis. Electronically Signed   By: Franki Cabot M.D.   On: 07/11/2017 22:27   Ir Percutaneous Art Thrombectomy/infusion Intracranial Inc Diag Angio  Result Date: 07/13/2017 INDICATION: Right gaze preference.   Left-sided hemiplegia. EXAM: 1. EMERGENT LARGE VESSEL OCCLUSION THROMBOLYSIS (anterior CIRCULATION) COMPARISON:  CT angiogram of the head and neck of 07/11/2017. MEDICATIONS: Vancomycin 1 g IV antibiotic was administered within 1 hour of the  procedure. ANESTHESIA/SEDATION: General anesthesia. CONTRAST:  Isovue 300 140 mL. FLUOROSCOPY TIME:  Fluoroscopy Time: 28 minutes 36 seconds (1044 mGy). COMPLICATIONS: None immediate. TECHNIQUE: Following a full explanation of the procedure along with the potential associated complications, an informed witnessed consent was obtained from the patient's wife. The risks of intracranial hemorrhage of 10%, worsening neurological deficit, ventilator dependency, death and inability to revascularize were all reviewed in detail with the patient's wife. The patient was then put under general anesthesia by the Department of Anesthesiology at [facility]. The right groin was prepped and draped in the usual sterile fashion. Thereafter using modified Seldinger technique, transfemoral access into the right common femoral artery was obtained without difficulty. Over a 0.035 inch guidewire a 5 French Pinnacle sheath was inserted. Through this, and also over a 0.035 inch guidewire a 5 [place] JB 1 catheter was advanced to the aortic arch region and selectively positioned in the innominate artery and the right common carotid artery. FINDINGS: The innominate artery injection demonstrates extensive calcification involving the proximal innominate artery extending into its origin. Moderate severe tortuosity also noted of the innominate artery leading to the origins of the right  common carotid artery and the right subclavian artery. The origin of the right vertebral artery is widely patent. The vessel is seen to opacify to the cranial skull base were it opacifies the right vertebrobasilar junction and partially visualized right posterior-inferior cerebellar artery. The right common carotid arteriogram demonstrates a circumferential partially calcified plaque at the right common carotid bifurcation. There is mild narrowing of the right external carotid artery. Its branches, however, opacify normally. The right internal carotid artery  at the bulb has approximately 30% stenosis secondary to a smooth calcified plaque. Mild caliber irregularity of the posterior wall of the bulb is also noted. There are no intraluminal filling defects noted. The right internal carotid artery, otherwise, is seen to opacify normally to the cranial skull base. The petrous, and cavernous segments demonstrate wide patency. There is approximately 50% stenosis of the right internal carotid artery supraclinoid segment proximally. A patent right posterior communicating artery and a right anterior choroidal artery are noted. The right anterior cerebral artery is seen to opacify into the capillary and venous phases with angiographic occlusion of the right pericallosal artery at the level of the anterior 1/3 of the corpus callosum. The right middle cerebral artery M1 segment demonstrates mild caliber irregularity. Patency of the inferior division is seen. Complete angiographic occlusion with a stump is seen of the dominant superior division of the right middle cerebral artery. The lateral projection demonstrates a large area of hypoperfusion to absent perfusion involving the right posterior frontal and parietal regions including the subcortical regions. PROCEDURE: ENDOVASCULAR COMPLETE REVASCULARIZATION OF OCCLUDED RIGHT MIDDLE CEREBRAL ARTERY SUPERIOR DIVISION ACHIEVING A TICI 2B REPERFUSION, AND OF THE RIGHT PERICALLOSAL ARTERY IN THE A2 A3 REGION FOLLOWING SUPER SELECTIVE INFUSION OF 7 MG OF INTRA-ARTERIAL TPA ACHIEVING A TICI 3 REPERFUSION. The diagnostic JB 1 catheter in the right common carotid artery was then exchanged over a 0.035 inch 300 cm Rosen exchange guidewire for an 8 French 55 cm Brite tip neurovascular sheath using biplane roadmap technique and constant fluoroscopic guidance. Good aspiration was obtained from the hub of the 8 French neurovascular sheath. This was then connected to continuous heparinized saline infusion. Over the [employer] guidewire, an 8  [place] 95 cm FlowGate balloon guide catheter which been prepped in 50% contrast and 50% heparinized saline infusion was advanced and positioned at the origin of the right internal carotid artery. The guidewire was removed. Good aspiration obtained from the hub of the [facility] guide catheter. A gentle contrast injection demonstrated no evidence of spasms, dissections or of intraluminal filling defects. Over a 0.014 inch Softip Synchro micro guidewire, a combination of a Navien 5 French 124 cm guide catheter inside of which was a 021 Trevo ProVue microcatheter was advanced to the distal end of the [facility] guide catheter. With the micro guidewire leading with a J-tip configuration the combination was navigated without difficulty to the supraclinoid right ICA. Using a torque device, the right M1 segment was entered with the micro guidewire followed by the microcatheter. The 5 [place] Navien guide catheter was then advanced to the proximal cavernous segment of the right internal carotid artery. The guidewire was removed. A control arteriogram performed through the 5 [place] Navien guide catheter in the right internal carotid artery demonstrated now significant improved caliber and flow through the superior division of the right middle cerebral artery. A TICI 2b revascularization was noted angiographically. There continued to be occlusion of the right anterior cerebral artery pericallosal artery in the A2 A3 region. The microcatheter was then  advanced over a 0.014 inch Softip Synchro micro guidewire into the A2 A3 region of the right anterior cerebral artery pericallosal artery. The guidewire was removed. Good aspiration obtained from the hub of the Trevo ProVue microcatheter. This was then connected to continuous heparinized saline infusion. Thereafter, super selectively, a total of 7 mg of super selective intra-arterial intracranial tPA was then infused over approximately 7 minutes in 7 cc of normal saline. At the end of  this, a control arteriogram performed through the Trevo ProVue microcatheter in the right pericallosal artery demonstrated complete revascularization of the right pericallosal artery achieving a TICI 3 reperfusion. The microcatheter with the 5 [place] Navien guide catheter were retrieved and removed. A control arteriogram performed through the 8 [place] FlowGate guide catheter in the right internal carotid artery demonstrated complete angiographic revascularization of the right anterior cerebral artery territory achieving a TICI 3 reperfusion. There continued to be a TICI 2b reperfusion of the right middle cerebral artery distribution. The 8 [place] FlowGate guide catheter was then retrieved into the abdominal aorta with the 8 French neurovascular sheath. These in turn were then exchanged over a J-tip guidewire for an 8 [place] Pinnacle sheath. The sheath was then removed and an Angio-Seal closure device was successfully applied. At the end of the procedure the right groin appeared soft without evidence of a hematoma or bleeding. The distal pulses remained palpable in the dorsalis pedis and posterior tibial regions. Throughout the procedure, the patient's blood pressure and neurological status remained stable. No angiographic evidence of extravasation or mass-effect on the major vessel was seen. IMPRESSION: Status post endovascular complete revascularization of occluded right anterior cerebral artery pericallosal artery with 7 mg of super selective intracranial intra-tPA achieving a TICI 3 reperfusion. Status post right middle cerebral artery TICI 2b reperfusion following IV tPA. PLAN: Patient transferred to CT scan for postprocedural CT scan of the brain. Electronically Signed   By: Luanne Bras M.D.   On: 07/12/2017 13:30   Ct Head Code Stroke Wo Contrast  Result Date: 07/11/2017 CLINICAL DATA:  Code stroke. 84 y/o M; left-sided deficits and right-sided gaze. EXAM: CT HEAD WITHOUT CONTRAST TECHNIQUE:  Contiguous axial images were obtained from the base of the skull through the vertex without intravenous contrast. COMPARISON:  05/13/2011 CT head. FINDINGS: Brain: Stable small chronic cortical infarcts in left frontal and right parietal lobes. Stable small chronic lacunar infarct in the right caudate body and right thalamus. Chronic infarction in the left inferior cerebellar hemisphere. Background of mild chronic microvascular ischemic changes and parenchymal volume loss of the brain. Vascular: Calcific atherosclerosis of carotid siphons. Hyperdensity within right distal M1 (series 6, image 21). Skull: Normal. Negative for fracture or focal lesion. Sinuses/Orbits: Left anterior ethmoid and frontal sinus partial opacification in chronic inflammatory changes. Right intra-ocular lens replacement. Other: None. ASPECTS [facility] Stroke Program Early CT Score) - Ganglionic level infarction (caudate, lentiform nuclei, internal capsule, insula, M1-M3 cortex): 7 - Supraganglionic infarction (M4-M6 cortex): 3 Total score (0-10 with 10 being normal): 10 IMPRESSION: 1. No acute intracranial abnormality identified. 2. ASPECTS is 10 3. Dense distal right M1, suspected thrombus. 4. Stable chronic microvascular ischemic changes, parenchymal volume loss, and small cortical infarctions. These results were called by telephone at the time of interpretation on 07/11/2017 at 6:43 pm to Dr. Lorraine Lax, who verbally acknowledged these results. Electronically Signed   By: Kristine Garbe M.D.   On: 07/11/2017 18:43   Ir Angio Vertebral Sel Subclavian Innominate Uni R Mod Sed  Result Date:  07/13/2017 INDICATION: Right gaze preference.   Left-sided hemiplegia. EXAM: 1. EMERGENT LARGE VESSEL OCCLUSION THROMBOLYSIS (anterior CIRCULATION) COMPARISON:  CT angiogram of the head and neck of 07/11/2017. MEDICATIONS: Vancomycin 1 g IV antibiotic was administered within 1 hour of the procedure. ANESTHESIA/SEDATION: General anesthesia. CONTRAST:   Isovue 300 140 mL. FLUOROSCOPY TIME:  Fluoroscopy Time: 28 minutes 36 seconds (1044 mGy). COMPLICATIONS: None immediate. TECHNIQUE: Following a full explanation of the procedure along with the potential associated complications, an informed witnessed consent was obtained from the patient's wife. The risks of intracranial hemorrhage of 10%, worsening neurological deficit, ventilator dependency, death and inability to revascularize were all reviewed in detail with the patient's wife. The patient was then put under general anesthesia by the Department of Anesthesiology at [facility]. The right groin was prepped and draped in the usual sterile fashion. Thereafter using modified Seldinger technique, transfemoral access into the right common femoral artery was obtained without difficulty. Over a 0.035 inch guidewire a 5 French Pinnacle sheath was inserted. Through this, and also over a 0.035 inch guidewire a 5 [place] JB 1 catheter was advanced to the aortic arch region and selectively positioned in the innominate artery and the right common carotid artery. FINDINGS: The innominate artery injection demonstrates extensive calcification involving the proximal innominate artery extending into its origin. Moderate severe tortuosity also noted of the innominate artery leading to the origins of the right common carotid artery and the right subclavian artery. The origin of the right vertebral artery is widely patent. The vessel is seen to opacify to the cranial skull base were it opacifies the right vertebrobasilar junction and partially visualized right posterior-inferior cerebellar artery. The right common carotid arteriogram demonstrates a circumferential partially calcified plaque at the right common carotid bifurcation. There is mild narrowing of the right external carotid artery. Its branches, however, opacify normally. The right internal carotid artery at the bulb has approximately 30% stenosis secondary to a  smooth calcified plaque. Mild caliber irregularity of the posterior wall of the bulb is also noted. There are no intraluminal filling defects noted. The right internal carotid artery, otherwise, is seen to opacify normally to the cranial skull base. The petrous, and cavernous segments demonstrate wide patency. There is approximately 50% stenosis of the right internal carotid artery supraclinoid segment proximally. A patent right posterior communicating artery and a right anterior choroidal artery are noted. The right anterior cerebral artery is seen to opacify into the capillary and venous phases with angiographic occlusion of the right pericallosal artery at the level of the anterior 1/3 of the corpus callosum. The right middle cerebral artery M1 segment demonstrates mild caliber irregularity. Patency of the inferior division is seen. Complete angiographic occlusion with a stump is seen of the dominant superior division of the right middle cerebral artery. The lateral projection demonstrates a large area of hypoperfusion to absent perfusion involving the right posterior frontal and parietal regions including the subcortical regions. PROCEDURE: ENDOVASCULAR COMPLETE REVASCULARIZATION OF OCCLUDED RIGHT MIDDLE CEREBRAL ARTERY SUPERIOR DIVISION ACHIEVING A TICI 2B REPERFUSION, AND OF THE RIGHT PERICALLOSAL ARTERY IN THE A2 A3 REGION FOLLOWING SUPER SELECTIVE INFUSION OF 7 MG OF INTRA-ARTERIAL TPA ACHIEVING A TICI 3 REPERFUSION. The diagnostic JB 1 catheter in the right common carotid artery was then exchanged over a 0.035 inch 300 cm Rosen exchange guidewire for an 8 French 55 cm Brite tip neurovascular sheath using biplane roadmap technique and constant fluoroscopic guidance. Good aspiration was obtained from the hub of the 8 [place] neurovascular  sheath. This was then connected to continuous heparinized saline infusion. Over the [employer] guidewire, an 8 [place] 95 cm FlowGate balloon guide catheter which been  prepped in 50% contrast and 50% heparinized saline infusion was advanced and positioned at the origin of the right internal carotid artery. The guidewire was removed. Good aspiration obtained from the hub of the [facility] guide catheter. A gentle contrast injection demonstrated no evidence of spasms, dissections or of intraluminal filling defects. Over a 0.014 inch Softip Synchro micro guidewire, a combination of a Navien 5 French 124 cm guide catheter inside of which was a 021 Trevo ProVue microcatheter was advanced to the distal end of the [facility] guide catheter. With the micro guidewire leading with a J-tip configuration the combination was navigated without difficulty to the supraclinoid right ICA. Using a torque device, the right M1 segment was entered with the micro guidewire followed by the microcatheter. The 5 [place] Navien guide catheter was then advanced to the proximal cavernous segment of the right internal carotid artery. The guidewire was removed. A control arteriogram performed through the 5 [place] Navien guide catheter in the right internal carotid artery demonstrated now significant improved caliber and flow through the superior division of the right middle cerebral artery. A TICI 2b revascularization was noted angiographically. There continued to be occlusion of the right anterior cerebral artery pericallosal artery in the A2 A3 region. The microcatheter was then advanced over a 0.014 inch Softip Synchro micro guidewire into the A2 A3 region of the right anterior cerebral artery pericallosal artery. The guidewire was removed. Good aspiration obtained from the hub of the Trevo ProVue microcatheter. This was then connected to continuous heparinized saline infusion. Thereafter, super selectively, a total of 7 mg of super selective intra-arterial intracranial tPA was then infused over approximately 7 minutes in 7 cc of normal saline. At the end of this, a control arteriogram performed through the Trevo  ProVue microcatheter in the right pericallosal artery demonstrated complete revascularization of the right pericallosal artery achieving a TICI 3 reperfusion. The microcatheter with the 5 [place] Navien guide catheter were retrieved and removed. A control arteriogram performed through the 8 [place] FlowGate guide catheter in the right internal carotid artery demonstrated complete angiographic revascularization of the right anterior cerebral artery territory achieving a TICI 3 reperfusion. There continued to be a TICI 2b reperfusion of the right middle cerebral artery distribution. The 8 [place] FlowGate guide catheter was then retrieved into the abdominal aorta with the 8 French neurovascular sheath. These in turn were then exchanged over a J-tip guidewire for an 8 [place] Pinnacle sheath. The sheath was then removed and an Angio-Seal closure device was successfully applied. At the end of the procedure the right groin appeared soft without evidence of a hematoma or bleeding. The distal pulses remained palpable in the dorsalis pedis and posterior tibial regions. Throughout the procedure, the patient's blood pressure and neurological status remained stable. No angiographic evidence of extravasation or mass-effect on the major vessel was seen. IMPRESSION: Status post endovascular complete revascularization of occluded right anterior cerebral artery pericallosal artery with 7 mg of super selective intracranial intra-tPA achieving a TICI 3 reperfusion. Status post right middle cerebral artery TICI 2b reperfusion following IV tPA. PLAN: Patient transferred to CT scan for postprocedural CT scan of the brain. Electronically Signed   By: Luanne Bras M.D.   On: 07/12/2017 13:30    Labs:  CBC: Recent Labs    11/17/16 0958 07/11/17 1820 07/11/17 1826 07/12/17 0359 07/13/17  0350  WBC 4.9 7.2  --  8.4 9.5  HGB 14.2 14.4 15.0 12.7* 12.8*  HCT 43.7 44.1 44.0 38.5* 39.3  PLT 234 219  --  196 195     COAGS: Recent Labs    04/20/17 0803 05/13/17 0929 06/23/17 0741 07/11/17 1820  INR 2.6 2.5 1.8 1.48  APTT  --   --   --  31    BMP: Recent Labs    11/17/16 0958 07/11/17 1820 07/11/17 1826 07/12/17 0359 07/13/17 0350  NA 140 138 139 138 140  K 4.7 4.6 4.6 4.1 3.4*  CL 106 107 105 110 110  CO2 25 21*  --  19* 20*  GLUCOSE 93 111* 107* 138* 107*  BUN 22 21* 21* 22* 15  CALCIUM 9.1 9.2  --  8.3* 8.5*  CREATININE 1.24* 1.29* 1.20 1.17 1.10  GFRNONAA  --  48*  --  55* 59*  GFRAA  --  56*  --  >60 >60    LIVER FUNCTION TESTS: Recent Labs    11/17/16 0958 07/11/17 1820  BILITOT 0.7 0.9  AST 19 25  ALT 15 14*  ALKPHOS 49 57  PROT 6.4 6.6  ALBUMIN 4.3 4.3    Assessment and Plan: R ACA occlusion s/p revascularization achieving TICI 2b reperfusion.  Patient communicating in short sentences.   Very tired on assessment today.  Attempting some lunch, but does seem faitgued vs. Weak.  Groin site intact, soft. Some bruising which is stable.  Please consult IR if needed.   Electronically Signed: Docia Barrier, PA 07/13/2017, 1:39 PM   I spent a total of 15 Minutes at the the patient's bedside AND on the patient's hospital floor or unit, greater than 50% of which was counseling/coordinating care for CVA.

## 2017-07-13 NOTE — Progress Notes (Signed)
I met with pt's wife at bedside to begin discussions concerning inpt rehab goals and expectations. She prefers inpt rehab rather than SNF. I discussed the need for physical assist from a caregiver at d/c. She states she will hire assist if needed. I will follow his progress to assist with planning final dispo. [phone]

## 2017-07-13 NOTE — Evaluation (Signed)
Clinical/Bedside Swallow Evaluation Patient Details  Name: Arthur [name] MRN: [id number] Date of Birth: 1928-07-16  Today's Date: 07/13/2017 Time: SLP Start Time (ACUTE ONLY): 9767 SLP Stop Time (ACUTE ONLY): 1432 SLP Time Calculation (min) (ACUTE ONLY): 27 min  Past Medical History:  Past Medical History:  Diagnosis Date  . Arthritis    "some in my joints" (01/24/2015)  . CKD (chronic kidney disease), stage II   . Coronary artery disease    a. s/p CABG in 2005 with MV repair and MAZE procedure. b. s/p DES to prox RCA 01/2015.   Marland Kitchen CVA (cerebrovascular accident) [facility]) ~ 2014   RIGHT BRAIN; denies residual on 01/24/2015  . Esophagitis    Distal esophagitis  . GERD (gastroesophageal reflux disease)   . Heart murmur   . History of hiatal hernia   . History of recurrent TIAs   . Hyperlipidemia   . Hypertension   . Hypertensive vascular disease   . MVP (mitral valve prolapse)   . Nephrolithiasis   . Odynophagia   . PAF (paroxysmal atrial fibrillation) ([place])   . Transudative pleural effusion    Past Surgical History:  Past Surgical History:  Procedure Laterality Date  . APPENDECTOMY  1960's  . CARDIAC CATHETERIZATION  03/19/2004  . CARDIAC CATHETERIZATION N/A 01/24/2015   Procedure: Left Heart Cath and Coronary Angiography;  Surgeon: Peter M Martinique, MD;  Location: [place] CV [facility];  Service: Cardiovascular;  Laterality: N/A;  . CARDIAC CATHETERIZATION N/A 01/24/2015   Procedure: Coronary Stent Intervention;  Surgeon: Peter M Martinique, MD;  Location: [place] CV [facility];  Service: Cardiovascular;  Laterality: N/A;  . CORONARY ANGIOPLASTY WITH STENT PLACEMENT  01/24/2015   "1 stent"  . CORONARY ARTERY BYPASS GRAFT  04/2004   LIMA GRAFT TO THE DISTAL LAD, SAPHENOUS VEIN GRAFT TO THE FIRST DIADGONAL BRANCH, SAPHENOUS VEIN GRAFT TO THE THIRD MARIGINAL BRANCH, AND SAPHENOUS VEIN GRAFT TO THE PDA  . IR ANGIO VERTEBRAL SEL SUBCLAVIAN INNOMINATE UNI R MOD SED  07/11/2017  . IR PERCUTANEOUS ART  THROMBECTOMY/INFUSION INTRACRANIAL INC DIAG ANGIO  07/11/2017  . MAZE  04/2004  . MITRAL VALVE REPAIR  04/2004  . RADIOLOGY WITH ANESTHESIA N/A 07/11/2017   Procedure: RADIOLOGY WITH ANESTHESIA;  Surgeon: Luanne Bras, MD;  Location: [place];  Service: Radiology;  Laterality: N/A;  . TEE WITHOUT CARDIOVERSION  05/19/2011   Procedure: TRANSESOPHAGEAL ECHOCARDIOGRAM (TEE);  Surgeon: Peter Martinique, MD;  Location: [facility] ENDOSCOPY;  Service: Cardiovascular;  Laterality: N/A;  . TONSILLECTOMY AND ADENOIDECTOMY  1944   HPI:  84 y.o.malewith history of afib on coumadin, LAA thrombus s/p MAZE procedure, CAD s/p CABG, stroke in 05/2011 with left large cerebellar infarct, HTN, HLDadmitted for left hemiplegia and right gaze, left neglect and left facial droop.TPA given. Successful revascularization Rt MCA on 1/29. MRI rightMCA and ACA patchy scattered moderate to largeinfarct; old large LEFT cerebellar infarct, old small LEFT frontal/MCA territory and RIGHT parietoccipital/posterior watershed territory infarcts.   Assessment / Plan / Recommendation Clinical Impression  Pt passed RN stroke swallow screen, but subsequently was observed by RN to have increased difficulty eating, hence SLP swallow evaluation was ordered.  Pt with decreased oral sensation and control, leading to loss of bolus anteriorly and buccal pocketing on left.  Consumption of thin liquids led to inconsistent coughing, and nectar-thick liquids elicited frequent throat-clearing, wet voice. Symptoms are concerning for aspiration.  Recommend downgrading diet to dysphagia 1, honey-thick liquids for tonight; MBS tomorrow.  D/W pt, spouse, and Therapist, sports.  SLP Visit Diagnosis: Dysphagia, unspecified (R13.10)    Aspiration Risk       Diet Recommendation   dysphagia 1, honey-thick liquids  Medication Administration: Crushed with puree    Other  Recommendations Oral Care Recommendations: Oral care BID Other Recommendations: Order thickener from  pharmacy   Follow up Recommendations Inpatient Rehab      Frequency and Duration            Prognosis        Swallow Study   General Date of Onset: 07/13/17 HPI: 84 y.o.malewith history of afib on coumadin, LAA thrombus s/p MAZE procedure, CAD s/p CABG, stroke in 05/2011 with left large cerebellar infarct, HTN, HLDadmitted for left hemiplegia and right gaze, left neglect and left facial droop.TPA given. Successful revascularization Rt MCA on 1/29. MRI rightMCA and ACA patchy scattered moderate to largeinfarct; old large LEFT cerebellar infarct, old small LEFT frontal/MCA territory and RIGHT parietoccipital/posterior watershed territory infarcts. Type of Study: Bedside Swallow Evaluation Previous Swallow Assessment: no Diet Prior to this Study: Regular;Thin liquids Temperature Spikes Noted: Yes Respiratory Status: Room air History of Recent Intubation: Yes Length of Intubations (days): 1 days Date extubated: 07/12/17 Behavior/Cognition: Alert;Cooperative Oral Cavity Assessment: Within Functional Limits Oral Care Completed by SLP: Recent completion by staff Oral Cavity - Dentition: Other (Comment);Missing dentition(plates) Vision: Impaired for self-feeding Self-Feeding Abilities: Needs assist;Able to feed self Patient Positioning: Upright in bed Baseline Vocal Quality: Wet Volitional Cough: Strong Volitional Swallow: Able to elicit    Oral/Motor/Sensory Function Overall Oral Motor/Sensory Function: Moderate impairment Facial ROM: Reduced left;Suspected CN VII (facial) dysfunction Facial Symmetry: Abnormal symmetry left;Suspected CN VII (facial) dysfunction Facial Strength: Reduced left;Suspected CN VII (facial) dysfunction Facial Sensation: Reduced left;Suspected CN V (Trigeminal) dysfunction Lingual Symmetry: Within Functional Limits Lingual Strength: Within Functional Limits Mandible: Within Functional Limits   Ice Chips Ice chips: Not tested   Thin Liquid Thin  Liquid: Impaired Presentation: Cup;Self Fed Oral Phase Impairments: Reduced labial seal Oral Phase Functional Implications: Left anterior spillage Pharyngeal  Phase Impairments: Suspected delayed Swallow;Cough - Delayed    Nectar Thick Nectar Thick Liquid: Impaired Presentation: Cup Oral Phase Impairments: Reduced labial seal Oral phase functional implications: Left anterior spillage Pharyngeal Phase Impairments: Wet Vocal Quality;Throat Clearing - Immediate   Honey Thick Honey Thick Liquid: Impaired Presentation: Cup Oral Phase Impairments: Reduced labial seal   Puree Puree: Impaired Presentation: Spoon Oral Phase Impairments: Reduced labial seal Oral Phase Functional Implications: Left anterior spillage   Solid   GO   Solid: Impaired Oral Phase Impairments: Reduced labial seal;Impaired mastication        Tivis Ringer, Amiylah Anastos Laurice 07/13/2017,2:44 PM

## 2017-07-13 NOTE — Care Management Note (Signed)
Case Management Note  Patient Details  Name: CARLEY STRICKLING MRN: [id number] Date of Birth: [birth year]  Subjective/Objective:   Pt admitted on 07/11/17 for left hemiplegia and right gaze, left neglect and left facial droop.TPA given. PTA, pt independent of ADLS; lives with spouse.                   Action/Plan: PT/OT recommending CIR, and consult in progress.  Will follow.    Expected Discharge Date:                  Expected Discharge Plan:  [place]  In-House Referral:     Discharge planning Services  CM Consult  Post Acute Care Choice:    Choice offered to:     DME Arranged:    DME Agency:     HH Arranged:    [place] Agency:     Status of Service:  In process, will continue to follow  If discussed at Long Length of Stay Meetings, dates discussed:    Additional Comments:  Reinaldo Raddle, RN, BSN  Trauma/Neuro ICU Case Manager [phone]

## 2017-07-13 NOTE — Progress Notes (Signed)
PULMONARY / CRITICAL CARE MEDICINE   Name: Arthur [name] MRN: [id number] DOB: 11/28/1928    ADMISSION DATE:  07/11/2017 CONSULTATION DATE:  07/11/17  REFERRING MD:  [place]  CHIEF COMPLAINT:  Left sided weakness  HISTORY OF PRESENT ILLNESS:   Arthur [name] is a 84 y.o. 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  He presented to [facility] ED 07/11/17 with left sided weakness and right gaze deviation.  CT was negative for hemorrhage.  CTA demonstrated concern for CVA and he was ruled in for tPA which he received.  He then was taken to IR where he had complete revascularization of right ACA with intraarterial tPA administered.  He returned to the ICU on the ventilator and PCCM was asked to assist with vent management.   SUBJECTIVE:  Extubated yesterday.  Looks great in chair.  A bit slow to respond but fairly appropriate, oriented.   VITAL SIGNS: BP (!) 144/65 (BP Location: Left Arm)   Pulse 79   Temp 98.4 F (36.9 C) (Axillary)   Resp (!) 22   Ht 5\' 8"  (1.727 m)   Wt 80.2 kg (176 lb 12.9 oz)   SpO2 93%   BMI 26.88 kg/m   HEMODYNAMICS:    VENTILATOR SETTINGS: FiO2 (%):  [40 %] 40 %  INTAKE / OUTPUT: I/O last 3 completed shifts: In: 2842.2 [I.V.:2842.2] Out: 2875 [Urine:2775; Blood:100]   PHYSICAL EXAMINATION: General: Elderly male, NAD in chair  Neuro:  Awake, alert, slow to respond but oriented to person and place, L sided weakness.  HEENT: [place]/AT. PERRL, sclerae anicteric Cardiovascular: IRIR, no M/R/G., S1, S2 noted  Lungs: Respirations even and unlabored on RA, few scattered rhonchi  Abdomen: BS x 4, soft, NT/ND.  Musculoskeletal: No gross deformities, no edema.    LABS:  BMET Recent Labs  Lab 07/11/17 1820 07/11/17 1826 07/12/17 0359 07/13/17 0350  NA 138 139 138 140  K 4.6 4.6 4.1 3.4*  CL 107 105 110 110  CO2 21*  --  19* 20*  BUN 21* 21* 22* 15   CREATININE 1.29* 1.20 1.17 1.10  GLUCOSE 111* 107* 138* 107*    Electrolytes Recent Labs  Lab 07/11/17 1820 07/12/17 0359 07/13/17 0350  CALCIUM 9.2 8.3* 8.5*    CBC Recent Labs  Lab 07/11/17 1820 07/11/17 1826 07/12/17 0359 07/13/17 0350  WBC 7.2  --  8.4 9.5  HGB 14.4 15.0 12.7* 12.8*  HCT 44.1 44.0 38.5* 39.3  PLT 219  --  196 195    Coag's Recent Labs  Lab 07/11/17 1820  APTT 31  INR 1.48    Sepsis Markers No results for input(s): LATICACIDVEN, PROCALCITON, O2SATVEN in the last 168 hours.  ABG Recent Labs  Lab 07/11/17 2155  PHART 7.374  PCO2ART 35.7  PO2ART 318*    Liver Enzymes Recent Labs  Lab 07/11/17 1820  AST 25  ALT 14*  ALKPHOS 57  BILITOT 0.9  ALBUMIN 4.3    Cardiac Enzymes No results for input(s): TROPONINI, PROBNP in the last 168 hours.  Glucose Recent Labs  Lab 07/11/17 1819  GLUCAP 90    Imaging Ct Head Wo Contrast  Result Date: 07/12/2017 CLINICAL DATA:  24 hour follow-up status post tPA. EXAM: CT HEAD WITHOUT CONTRAST TECHNIQUE: Contiguous axial images were obtained from the base of the skull through the vertex without intravenous contrast. COMPARISON:  Previous MRI from earlier  the same day. FINDINGS: Brain: Patchy multifocal ischemic infarcts involving the right MCA and ACA territories as well as the watershed distribution again see, stable from previous MRI. No evidence for hemorrhagic transformation or other complication status post tPA. Otherwise stable appearance of the brain. Small remote left frontal infarct again noted. Remote left cerebellar infarct. Stable atrophy with chronic small vessel ischemic disease. No other acute intracranial hemorrhage or large vessel territory infarct. No mass lesion, midline shift or mass effect. No hydrocephalus. No extra-axial fluid collection. Vascular: No hyperdense vessel. Scattered vascular calcifications noted within the carotid siphons. Skull: Scalp soft tissues and calvarium  within normal limits. Sinuses/Orbits: Globes and orbital soft tissues within normal limits. Air-fluid levels noted within the sphenoid and maxillary sinuses. Scattered mucosal thickening within the ethmoidal sinuses. Mastoids are clear. Other: None. IMPRESSION: 1. Continued interval evolution right MCA and ACA territory infarcts, stable in distribution as compared to recent MRI. No evidence for hemorrhagic transformation or other complication identified. 2. Otherwise stable appearance of the brain. No other acute intracranial abnormality. 3. Acute maxillary and sphenoid sinusitis. Electronically Signed   By: Jeannine Boga M.D.   On: 07/12/2017 19:27    STUDIES:  CT head 1/28 > no hemorrhage, dense distal right M1. CTA head and neck 1/28 > right M2 occlusion, new calcification in right parieto-occipital region. CT head 1/29 > 1. Continued interval evolution right MCA and ACA territory infarcts, stable in distribution as compared to recent MRI. No evidence for hemorrhagic transformation or other complication identified. 2. Otherwise stable appearance of the brain. No other acute intracranial abnormality. 3. Acute maxillary and sphenoid sinusitis. Echo 1/28 > EF 32-95%, grade 2 diastolic dysfunction, mild TR otherwise no sig valvular abnormalities, PA peak pressure 79mmHg  MRA brain 1/29 >1. Successful revascularization of RIGHT middle cerebral artery occlusion. Distal anterior cerebral artery's not included on typical MRA head. 2. Focally occluded RIGHT P3 segment (possibly overestimated by calcification and resultant artifact) with immediate reconstitution. Severe stenosis RIGHT P1 segment.  CULTURES: None.  ANTIBIOTICS: None.  SIGNIFICANT EVENTS: 1/28 > admitted with CVA > received systemic tPA and also taken to IR where he had complete revascularization of right ACA with intraarterial tPA administered.  LINES/TUBES: ETT 1/28 > 1/29  DISCUSSION: 84 y.o. male admitted with  concern for CVA, received systemic tPA as well as intrarterial tPA followed by complete revascularization of occluded right ACA.  ASSESSMENT / PLAN:  PULMONARY A: Respiratory insufficiency - s/p intubation for IR procedure. P:   Extubated 1/29 Pulmonary hygiene  Mobilize  Supplemental O2 if needed to keep sats >92%   CARDIOVASCULAR A:  Hx PAF (s/p MAZE), HTN, HLD, CAD, MVP (s/p repair), left atrial appendage thrombus (on coumadin). Echo EF 55-60% Pulm HTN - PASP 75mmHg  P:  Tele Off cardene  Monitor hemodynamics. BP goals per neuro. Continue preadmission statin. Hold preadmission coumadin, amlodipine, benazepril, carvedilol, nitro.  RENAL A:   No acute issues. P:   KVO IVF once taking po's  F/u chem  Replete electrolytes as needed  GASTROINTESTINAL A:   GERD. Nutrition. P:   Speech following  PO Diet per speech recs  PPI   HEMATOLOGIC A:   VTE Prophylaxis. HGB Platelets stable  P:  SCD's. Trend CBC Monitor for active bleeding Transfuse per ICU protocol.  INFECTIOUS A:   No indication of infection. T Max 98.3 P:   Monitor Fever curve and WBC Culture as is clinically indicated   ENDOCRINE A:   No acute issues. P:  HGB A1C >> 5.6  NEUROLOGIC A:   Right ACA occlusion - s/p systemic tPA followed by IA tPA as well as IR revascularization. Hx CVA, TIA. Following commands with R, WD on left Left mouth and facial droop Sedation off P:   Neuro following / managing. PT/OT  Mobilize   Family updated: No family at bedside 1/30   Looks great extubated, in chair.   Hemodynamically stable.  Can tx out of ICU from PCCM standpoint.   PCCM singing off please call back if needed.   Nickolas Madrid, NP 07/13/2017  9:30 AM Pager: [phone] or [phone]

## 2017-07-14 ENCOUNTER — Telehealth: Payer: Self-pay | Admitting: Cardiology

## 2017-07-14 ENCOUNTER — Inpatient Hospital Stay (HOSPITAL_COMMUNITY): Payer: Medicare Other

## 2017-07-14 DIAGNOSIS — R509 Fever, unspecified: Secondary | ICD-10-CM

## 2017-07-14 DIAGNOSIS — I482 Chronic atrial fibrillation: Secondary | ICD-10-CM

## 2017-07-14 LAB — URINALYSIS, ROUTINE W REFLEX MICROSCOPIC
BILIRUBIN URINE: NEGATIVE
Glucose, UA: 50 mg/dL — AB
KETONES UR: 20 mg/dL — AB
Leukocytes, UA: NEGATIVE
Nitrite: NEGATIVE
PH: 6 (ref 5.0–8.0)
Protein, ur: 30 mg/dL — AB
SQUAMOUS EPITHELIAL / LPF: NONE SEEN
Specific Gravity, Urine: 1.013 (ref 1.005–1.030)

## 2017-07-14 MED ORDER — LEVOFLOXACIN IN D5W 750 MG/150ML IV SOLN
750.0000 mg | INTRAVENOUS | Status: DC
  Administered 2017-07-14: 750 mg via INTRAVENOUS
  Filled 2017-07-14: qty 150

## 2017-07-14 MED ORDER — POTASSIUM CHLORIDE CRYS ER 20 MEQ PO TBCR
40.0000 meq | EXTENDED_RELEASE_TABLET | Freq: Two times a day (BID) | ORAL | Status: DC
  Administered 2017-07-14 (×2): 40 meq via ORAL
  Filled 2017-07-14 (×2): qty 2

## 2017-07-14 MED ORDER — ACETAMINOPHEN 650 MG RE SUPP
650.0000 mg | RECTAL | Status: DC | PRN
Start: 2017-07-14 — End: 2017-07-15

## 2017-07-14 NOTE — Progress Notes (Signed)
Pharmacy Antibiotic Note  Arthur [name] is a 84 y.o. male to begin empiric antibiotics for fever.  Pharmacy has been consulted for Levaquin dosing. PCN allergy.  Temp to 100.6, WBC 9.5  Plan:  Levaquin 750 mg IV q48hrs.  Follow renal function, any culture data, clinical progress.  Height: 5\' 8"  (172.7 cm) Weight: 176 lb 12.9 oz (80.2 kg) IBW/kg (Calculated) : 68.4  Temp (24hrs), Avg:99 F (37.2 C), Min:98.3 F (36.8 C), Max:100.6 F (38.1 C)  Recent Labs  Lab 07/11/17 1820 07/11/17 1826 07/12/17 0359 07/13/17 0350  WBC 7.2  --  8.4 9.5  CREATININE 1.29* 1.20 1.17 1.10    Estimated Creatinine Clearance: 46.6 mL/min (by C-G formula based on SCr of 1.1 mg/dL).    Allergies  Allergen Reactions  . Tape Other (See Comments)    PATIENT IS TAKING COUMADIN; HIS SKIN TEARS & BRUISES EASILY; PLEASE USE COBAN WRAP OR AN ALTERNATIVE TO MEDICAL TAPE!!  . Lasix [Furosemide] Rash  . Penicillins Rash    Has patient had a PCN reaction causing immediate rash, facial/tongue/throat swelling, SOB or lightheadedness with hypotension: Yes Has patient had a PCN reaction causing severe rash involving mucus membranes or skin necrosis: Unk Has patient had a PCN reaction that required hospitalization: Unk Has patient had a PCN reaction occurring within the last 10 years:  If all of the above answers are "NO", then may proceed with Cephalosporin use.   . Sulfa Drugs Cross Reactors Rash    Antimicrobials this admission:  Levaquin 1/31>>  Dose adjustments this admission:  n/a  Microbiology results:  1/29 MRSA PCR - negative  Thank you for allowing pharmacy to be a part of this patient's care.  Arty Baumgartner, [place] Pager: [phone] 07/14/2017 5:57 PM

## 2017-07-14 NOTE — Progress Notes (Signed)
I met with wife at pt's bedside with pt up in chair. Wife wishes for me to pursue an inpt rehab admit with his [facility]. I will initiate authorization for possible admit tomorrow. [phone]

## 2017-07-14 NOTE — Progress Notes (Signed)
Modified Barium Swallow Progress Note  Patient Details  Name: Arthur [name] MRN: [id number] Date of Birth: 1929/04/07  Today's Date: 07/14/2017  Modified Barium Swallow completed.  Full report located under Chart Review in the Imaging Section.  Brief recommendations include the following:  Clinical Impression  Pt demonstrates a moderate oral dysphagia with weak labial and buccal tension the left with buccal and lingual residuals post swallow. Oral deficits lead to spillage and increased latency between spill of liquids to pyriforms and swallow initiation.   Spillage wIth thin liquids, along with base of tongue weakness and vallecular residue, results in trace sensed aspriation events with thin liquids. Nectar thick liquids are toelrated more consistently though a second swallow is needed to clear residuals. A baseline structural impediment (appearance of osteophytic changes at C6/7) also poses increased resistance to bolus transit, though negative pressure is very good with minimal pyriform sinus residue observed. Greatest concern for safety is potential fatigue with increased residue and inconsistent ability to follow strategy of second swallow. Pt will need close supervision with Dys 1/nectar thick liquids. Recommend trialing CTAR exercise during acure stay.    Swallow Evaluation Recommendations       SLP Diet Recommendations: Dysphagia 1 (Puree) solids;Nectar thick liquid   Liquid Administration via: Straw   Medication Administration: Crushed with puree   Supervision: Staff to assist with self feeding   Compensations: Slow rate;Small sips/bites;Minimize environmental distractions;Multiple dry swallows after each bite/sip   Postural Changes: Seated upright at 90 degrees   Oral Care Recommendations: Oral care BID   Other Recommendations: Order thickener from [place], [place] [phone]  Lynann Beaver 07/14/2017,12:34 PM

## 2017-07-14 NOTE — Telephone Encounter (Signed)
New message  Pt wife verbalized that she is calling for the RN  Pt husband has been diagnosed with a stroke 07/11/2017  He is in the hospital at [facility]   His speech was affected

## 2017-07-14 NOTE — Progress Notes (Signed)
STROKE TEAM PROGRESS NOTE   SUBJECTIVE (INTERVAL HISTORY) No family is at the bedside. Pt is laying in bed in NAD. Left hemiplegia and moderate dysarthria with left facial droop unchanged. Following all simple commands. Fever noted this afternoon. No leukocytosis. Will obtain CXR and repeat U/A. Will continue to monitor closely  OBJECTIVE Temp:  [98.3 F (36.8 C)-100.6 F (38.1 C)] 100.6 F (38.1 C) (01/31 1418) Pulse Rate:  [69-74] 73 (01/31 1418) Cardiac Rhythm: Heart block (01/31 0700) Resp:  [18-22] 18 (01/31 1418) BP: (130-161)/(59-83) 136/64 (01/31 1418) SpO2:  [95 %-97 %] 96 % (01/31 1418)  Recent Labs  Lab 07/11/17 1819  GLUCAP 90   Recent Labs  Lab 07/11/17 1820 07/11/17 1826 07/12/17 0359 07/13/17 0350  NA 138 139 138 140  K 4.6 4.6 4.1 3.4*  CL 107 105 110 110  CO2 21*  --  19* 20*  GLUCOSE 111* 107* 138* 107*  BUN 21* 21* 22* 15  CREATININE 1.29* 1.20 1.17 1.10  CALCIUM 9.2  --  8.3* 8.5*   Recent Labs  Lab 07/11/17 1820  AST 25  ALT 14*  ALKPHOS 57  BILITOT 0.9  PROT 6.6  ALBUMIN 4.3   Recent Labs  Lab 07/11/17 1820 07/11/17 1826 07/12/17 0359 07/13/17 0350  WBC 7.2  --  8.4 9.5  NEUTROABS 4.7  --  7.1  --   HGB 14.4 15.0 12.7* 12.8*  HCT 44.1 44.0 38.5* 39.3  MCV 90.9  --  90.8 91.6  PLT 219  --  196 195   No results for input(s): CKTOTAL, CKMB, CKMBINDEX, TROPONINI in the last 168 hours. Recent Labs    07/11/17 1820  LABPROT 17.8*  INR 1.48   Recent Labs    07/12/17 0420  COLORURINE YELLOW  LABSPEC 1.044*  PHURINE 5.0  GLUCOSEU NEGATIVE  HGBUR SMALL*  BILIRUBINUR NEGATIVE  KETONESUR NEGATIVE  PROTEINUR NEGATIVE  NITRITE NEGATIVE  LEUKOCYTESUR SMALL*       Component Value Date/Time   CHOL 122 07/12/2017 0359   TRIG 92 07/12/2017 0359   TRIG 93 07/12/2017 0359   HDL 38 (L) 07/12/2017 0359   CHOLHDL 3.2 07/12/2017 0359   VLDL 18 07/12/2017 0359   LDLCALC 66 07/12/2017 0359   Lab Results  Component Value Date    HGBA1C 5.6 07/13/2017      Component Value Date/Time   LABOPIA NONE DETECTED 07/12/2017 0420   COCAINSCRNUR NONE DETECTED 07/12/2017 0420   LABBENZ NONE DETECTED 07/12/2017 0420   AMPHETMU NONE DETECTED 07/12/2017 0420   THCU NONE DETECTED 07/12/2017 0420   LABBARB NONE DETECTED 07/12/2017 0420    Recent Labs  Lab 07/11/17 1820  ETH <10    I have personally reviewed the radiological images below and agree with the radiology interpretations.  Ct Angio Head W Or Wo Contrast  Result Date: 07/11/2017 CLINICAL DATA:  84 y/o  M; code stroke with left-sided deficits. EXAM: CT ANGIOGRAPHY HEAD AND NECK TECHNIQUE: Multidetector CT imaging of the head and neck was performed using the standard protocol during bolus administration of intravenous contrast. Multiplanar CT image reconstructions and MIPs were obtained to evaluate the vascular anatomy. Carotid stenosis measurements (when applicable) are obtained utilizing NASCET criteria, using the distal internal carotid diameter as the denominator. CONTRAST:  60mL ISOVUE-370 IOPAMIDOL (ISOVUE-370) INJECTION 76% COMPARISON:  07/11/2016 CT head.  05/15/2011 MRA head. FINDINGS: CTA NECK FINDINGS Aortic arch: Standard branching. Imaged portion shows no evidence of aneurysm or dissection. No significant stenosis of the major  arch vessel origins. Moderate calcific atherosclerosis. Status post LIMA and saphenous CABG grafts. Right carotid system: No evidence of dissection, stenosis (50% or greater) or occlusion. Dense calcified plaque of the carotid bifurcation with mild less than 50% distal common carotid and internal carotid artery stenosis. Left carotid system: No evidence of dissection, stenosis (50% or greater) or occlusion. Dense calcified plaque of carotid bifurcation with mild less than 50% proximal ICA stenosis. Calcified plaque of left mid common carotid artery with minimal less than 30% stenosis. Vertebral arteries: Codominant. No evidence of dissection,  stenosis (50% or greater) or occlusion. Right V1 calcified plaque with mild less than 50% stenosis. Skeleton: Severe cervical spondylosis with extensive discogenic degenerative changes greatest at the C5-C7 levels and severe facet hypertrophy. Reversal of cervical curvature with apex at C5. Multiple levels of canal stenosis greatest at the C5-6 level where it is moderate to severe. Uncovertebral and facet hypertrophy narrows the neural foramen greatest at the bilateral C5-6 and C6-7 levels. Other neck: Negative. Upper chest: Negative. Review of the MIP images confirms the above findings CTA HEAD FINDINGS Anterior circulation: Right M2 superior division proximal occlusion (series 7, image 90). Poor collateralization in the right M2 superior division distribution. Left distal M1 short segment of moderate stenosis. Bilateral carotid siphon calcific plaque with moderate bilateral paraclinoid stenosis. Posterior circulation: New focal calcification within the right P4 parietooccipital branch origin (series 8, image 116) with normal downstream opacification of the artery, possibly atherosclerotic or nonocclusive calcified plaque thrombus. Bilateral P1 irregularity with mild stenosis. Venous sinuses: As permitted by contrast timing, patent. Anatomic variants: Anterior communicating artery. No posterior communicating artery identified, likely hypoplastic or absent. Delayed phase: No abnormal intracranial enhancement. Review of the MIP images confirms the above findings IMPRESSION: CTA neck: 1. No dissection, aneurysm, or high-grade stenosis by NASCET criteria. 2. Calcified plaque of aorta and carotid bifurcations with mild less than 50% proximal ICA stenosis bilaterally. 3. Severe cervical spondylosis greatest at C5 and C6 levels. At least moderate C5-6 canal stenosis. CTA head: 1. Right M2 superior division proximal occlusion with poor downstream collateralization. 2. New calcification within right P 4 parieto-occipital  origin, possibly atherosclerotic or nonocclusive calcified plaque thrombus. 3. Intracranial atherosclerosis with multiple areas of mild-to-moderate stenosis. No high-grade stenosis, aneurysm, or additional large vessel occlusion. These results were communicated to Dr. Lorraine Lax at [place] 1/28/2019by text page via the [facility] messaging system. Electronically Signed   By: Kristine Garbe M.D.   On: 07/11/2017 19:05   Ct Head Wo Contrast  Result Date: 07/12/2017 CLINICAL DATA:  24 hour follow-up status post tPA. EXAM: CT HEAD WITHOUT CONTRAST TECHNIQUE: Contiguous axial images were obtained from the base of the skull through the vertex without intravenous contrast. COMPARISON:  Previous MRI from earlier the same day. FINDINGS: Brain: Patchy multifocal ischemic infarcts involving the right MCA and ACA territories as well as the watershed distribution again see, stable from previous MRI. No evidence for hemorrhagic transformation or other complication status post tPA. Otherwise stable appearance of the brain. Small remote left frontal infarct again noted. Remote left cerebellar infarct. Stable atrophy with chronic small vessel ischemic disease. No other acute intracranial hemorrhage or large vessel territory infarct. No mass lesion, midline shift or mass effect. No hydrocephalus. No extra-axial fluid collection. Vascular: No hyperdense vessel. Scattered vascular calcifications noted within the carotid siphons. Skull: Scalp soft tissues and calvarium within normal limits. Sinuses/Orbits: Globes and orbital soft tissues within normal limits. Air-fluid levels noted within the sphenoid and maxillary sinuses.  Scattered mucosal thickening within the ethmoidal sinuses. Mastoids are clear. Other: None. IMPRESSION: 1. Continued interval evolution right MCA and ACA territory infarcts, stable in distribution as compared to recent MRI. No evidence for hemorrhagic transformation or other complication identified. 2.  Otherwise stable appearance of the brain. No other acute intracranial abnormality. 3. Acute maxillary and sphenoid sinusitis. Electronically Signed   By: Jeannine Boga M.D.   On: 07/12/2017 19:27   Ct Head Wo Contrast  Result Date: 07/11/2017 CLINICAL DATA:  84 y/o M; post intra-arterial intervention for stroke. EXAM: CT HEAD WITHOUT CONTRAST TECHNIQUE: Contiguous axial images were obtained from the base of the skull through the vertex without intravenous contrast. COMPARISON:  07/11/2017 CT head and CT angiogram head. FINDINGS: Brain: Interval loss of gray-white differentiation in the right perisylvian frontal and parietal lobes (series 3, image 26 and series 6, image 21) likely representing developing acute infarction. No hemorrhage. Stable small chronic cortical infarcts in the frontal and right parietal lobes, small chronic infarct in left inferior cerebellar hemisphere, mild chronic microvascular ischemic changes, and parenchymal volume loss of the brain. Vascular: Contrast is present within the vascular structures. Skull: Normal. Negative for fracture or focal lesion. Sinuses/Orbits: Persistent partial opacification of left anterior ethmoid and frontal sinuses. Fluid level in the right maxillary sinus. Normal aeration of mastoid air cells. Other: None. IMPRESSION: 1. Interval loss of gray-white differentiation in the right perisylvian frontal and parietal lobes likely representing a developing acute infarction. No hemorrhage. 2. Stable background of chronic microvascular ischemic changes and parenchymal volume loss of the brain. Electronically Signed   By: Kristine Garbe M.D.   On: 07/11/2017 21:50   Ct Angio Neck W And/or Wo Contrast  Result Date: 07/11/2017 CLINICAL DATA:  84 y/o  M; code stroke with left-sided deficits. EXAM: CT ANGIOGRAPHY HEAD AND NECK TECHNIQUE: Multidetector CT imaging of the head and neck was performed using the standard protocol during bolus administration of  intravenous contrast. Multiplanar CT image reconstructions and MIPs were obtained to evaluate the vascular anatomy. Carotid stenosis measurements (when applicable) are obtained utilizing NASCET criteria, using the distal internal carotid diameter as the denominator. CONTRAST:  27mL ISOVUE-370 IOPAMIDOL (ISOVUE-370) INJECTION 76% COMPARISON:  07/11/2016 CT head.  05/15/2011 MRA head. FINDINGS: CTA NECK FINDINGS Aortic arch: Standard branching. Imaged portion shows no evidence of aneurysm or dissection. No significant stenosis of the major arch vessel origins. Moderate calcific atherosclerosis. Status post LIMA and saphenous CABG grafts. Right carotid system: No evidence of dissection, stenosis (50% or greater) or occlusion. Dense calcified plaque of the carotid bifurcation with mild less than 50% distal common carotid and internal carotid artery stenosis. Left carotid system: No evidence of dissection, stenosis (50% or greater) or occlusion. Dense calcified plaque of carotid bifurcation with mild less than 50% proximal ICA stenosis. Calcified plaque of left mid common carotid artery with minimal less than 30% stenosis. Vertebral arteries: Codominant. No evidence of dissection, stenosis (50% or greater) or occlusion. Right V1 calcified plaque with mild less than 50% stenosis. Skeleton: Severe cervical spondylosis with extensive discogenic degenerative changes greatest at the C5-C7 levels and severe facet hypertrophy. Reversal of cervical curvature with apex at C5. Multiple levels of canal stenosis greatest at the C5-6 level where it is moderate to severe. Uncovertebral and facet hypertrophy narrows the neural foramen greatest at the bilateral C5-6 and C6-7 levels. Other neck: Negative. Upper chest: Negative. Review of the MIP images confirms the above findings CTA HEAD FINDINGS Anterior circulation: Right M2 superior division  proximal occlusion (series 7, image 90). Poor collateralization in the right M2 superior  division distribution. Left distal M1 short segment of moderate stenosis. Bilateral carotid siphon calcific plaque with moderate bilateral paraclinoid stenosis. Posterior circulation: New focal calcification within the right P4 parietooccipital branch origin (series 8, image 116) with normal downstream opacification of the artery, possibly atherosclerotic or nonocclusive calcified plaque thrombus. Bilateral P1 irregularity with mild stenosis. Venous sinuses: As permitted by contrast timing, patent. Anatomic variants: Anterior communicating artery. No posterior communicating artery identified, likely hypoplastic or absent. Delayed phase: No abnormal intracranial enhancement. Review of the MIP images confirms the above findings IMPRESSION: CTA neck: 1. No dissection, aneurysm, or high-grade stenosis by NASCET criteria. 2. Calcified plaque of aorta and carotid bifurcations with mild less than 50% proximal ICA stenosis bilaterally. 3. Severe cervical spondylosis greatest at C5 and C6 levels. At least moderate C5-6 canal stenosis. CTA head: 1. Right M2 superior division proximal occlusion with poor downstream collateralization. 2. New calcification within right P 4 parieto-occipital origin, possibly atherosclerotic or nonocclusive calcified plaque thrombus. 3. Intracranial atherosclerosis with multiple areas of mild-to-moderate stenosis. No high-grade stenosis, aneurysm, or additional large vessel occlusion. These results were communicated to Dr. Lorraine Lax at [place] 1/28/2019by text page via the [facility] messaging system. Electronically Signed   By: Kristine Garbe M.D.   On: 07/11/2017 19:05   Mr Jodene Nam Head Wo Contrast  Result Date: 07/12/2017 CLINICAL DATA:  LEFT-sided weakness, RIGHT gaze deficit. Status post tPA and complete revascularization of RIGHT ACA occlusion. EXAM: MRI HEAD WITHOUT CONTRAST MRA HEAD WITHOUT CONTRAST TECHNIQUE: Multiplanar, multiecho pulse sequences of the brain and surrounding  structures were obtained without intravenous contrast. Angiographic images of the head were obtained using MRA technique without contrast. COMPARISON:  CT HEAD and CTA HEAD and neck July 11, 2016. MRI of the head May 15, 2011 FINDINGS: MRI HEAD FINDINGS INTRACRANIAL CONTENTS: Confluent RIGHT frontoparietal reduced diffusion including medial cortex with low ADC values. Discontinuous RIGHT frontal and RIGHT basal ganglia reduced diffusion and low ADC values. Subcentimeter reduced diffusion and low ADC values RIGHT posterior temporal lobe. Faint susceptibility artifact RIGHT temporoparietal lobes compatible with petechial hemorrhage. Scattered chronic micro hemorrhages. LEFT cerebellar encephalomalacia. Small area RIGHT temporal occipital and LEFT frontal encephalomalacia. Old small RIGHT cerebellar, RIGHT pontine, RIGHT thalamus and RIGHT basal ganglia to corona radiata infarct. Prominent basal ganglia perivascular spaces associated with chronic small vessel ischemic disease. A few additional subcentimeter supratentorial white matter FLAIR T2 hyperintensities compatible with mild chronic small vessel ischemic disease, less than expected for age. No advanced parenchymal brain volume loss for age. No midline shift, mass effect or masses. No abnormal extra-axial fluid collections. VASCULAR: Normal major intracranial vascular flow voids present at skull base. SKULL AND UPPER CERVICAL SPINE: No abnormal sellar expansion. No suspicious calvarial bone marrow signal. 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 Craniocervical junction maintained. SINUSES/ORBITS: Pan paranasal sinusitis with large RIGHT maxillary sinus air-fluid level. Trace RIGHT mastoid effusion. Included ocular globes and orbital contents are non-suspicious. OTHER: None. MRA HEAD FINDINGS ANTERIOR CIRCULATION: Normal flow related enhancement of the included cervical, petrous, cavernous and supraclinoid internal carotid  arteries. Moderate stenosis bilateral para clinoid internal carotid arteries. Patent anterior communicating artery. Patent anterior and middle cerebral arteries, bilateral anterior cerebral arteries distal segments on included on MRA. No large vessel occlusion, flow limiting stenosis, aneurysm. POSTERIOR CIRCULATION: Codominant vertebral arteries. Patent vertebrobasilar junction and basilar artery. Main branch vessels not well demonstrated. Severe stenosis RIGHT P1 segment,  moderate LEFT P2 stenosis. RIGHT P3 occlusion with thready reconstitution. No  aneurysm. ANATOMIC VARIANTS: None. Source images and MIP images were reviewed. IMPRESSION: MRI head: 1. Acute moderate RIGHT MCA and small RIGHT distal ACA infarcts. Small RIGHT posterior watershed territory infarct. Minimal petechial hemorrhage without hemorrhagic conversion. 2. 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 Multiple small vessel supra-and infratentorial old infarcts. MRA head: 1. Successful revascularization of RIGHT middle cerebral artery occlusion. Distal anterior cerebral artery's not included on typical MRA head. 2. Focally occluded RIGHT P3 segment (possibly overestimated by calcification and resultant artifact) with immediate reconstitution. Severe stenosis RIGHT P1 segment. Electronically Signed   By: Elon Alas M.D.   On: 07/12/2017 02:04   Mr Brain Wo Contrast  Result Date: 07/12/2017 CLINICAL DATA:  LEFT-sided weakness, RIGHT gaze deficit. Status post tPA and complete revascularization of RIGHT ACA occlusion. EXAM: MRI HEAD WITHOUT CONTRAST MRA HEAD WITHOUT CONTRAST TECHNIQUE: Multiplanar, multiecho pulse sequences of the brain and surrounding structures were obtained without intravenous contrast. Angiographic images of the head were obtained using MRA technique without contrast. COMPARISON:  CT HEAD and CTA HEAD and neck July 11, 2016.  MRI of the head May 15, 2011 FINDINGS: MRI HEAD FINDINGS INTRACRANIAL CONTENTS: Confluent RIGHT frontoparietal reduced diffusion including medial cortex with low ADC values. Discontinuous RIGHT frontal and RIGHT basal ganglia reduced diffusion and low ADC values. Subcentimeter reduced diffusion and low ADC values RIGHT posterior temporal lobe. Faint susceptibility artifact RIGHT temporoparietal lobes compatible with petechial hemorrhage. Scattered chronic micro hemorrhages. LEFT cerebellar encephalomalacia. Small area RIGHT temporal occipital and LEFT frontal encephalomalacia. Old small RIGHT cerebellar, RIGHT pontine, RIGHT thalamus and RIGHT basal ganglia to corona radiata infarct. Prominent basal ganglia perivascular spaces associated with chronic small vessel ischemic disease. A few additional subcentimeter supratentorial white matter FLAIR T2 hyperintensities compatible with mild chronic small vessel ischemic disease, less than expected for age. No advanced parenchymal brain volume loss for age. No midline shift, mass effect or masses. No abnormal extra-axial fluid collections. VASCULAR: Normal major intracranial vascular flow voids present at skull base. SKULL AND UPPER CERVICAL SPINE: No abnormal sellar expansion. No suspicious calvarial bone marrow signal. 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 Craniocervical junction maintained. SINUSES/ORBITS: Pan paranasal sinusitis with large RIGHT maxillary sinus air-fluid level. Trace RIGHT mastoid effusion. Included ocular globes and orbital contents are non-suspicious. OTHER: None. MRA HEAD FINDINGS ANTERIOR CIRCULATION: Normal flow related enhancement of the included cervical, petrous, cavernous and supraclinoid internal carotid arteries. Moderate stenosis bilateral para clinoid internal carotid arteries. Patent anterior communicating artery. Patent anterior and middle cerebral arteries, bilateral anterior cerebral arteries distal  segments on included on MRA. No large vessel occlusion, flow limiting stenosis, aneurysm. POSTERIOR CIRCULATION: Codominant vertebral arteries. Patent vertebrobasilar junction and basilar artery. Main branch vessels not well demonstrated. Severe stenosis RIGHT P1 segment, moderate LEFT P2 stenosis. RIGHT P3 occlusion with thready reconstitution. No  aneurysm. ANATOMIC VARIANTS: None. Source images and MIP images were reviewed. IMPRESSION: MRI head: 1. Acute moderate RIGHT MCA and small RIGHT distal ACA infarcts. Small RIGHT posterior watershed territory infarct. Minimal petechial hemorrhage without hemorrhagic conversion. 2. 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 Multiple small vessel supra-and infratentorial old infarcts. MRA head: 1. Successful revascularization of RIGHT middle cerebral artery occlusion. Distal anterior cerebral artery's not included on typical MRA head. 2. Focally occluded RIGHT P3 segment (possibly overestimated by calcification  and resultant artifact) with immediate reconstitution. Severe stenosis RIGHT P1 segment. Electronically Signed   By: Elon Alas M.D.   On: 07/12/2017 02:04   Portable Chest Xray  Result Date: 07/12/2017 CLINICAL DATA:  84 year old male status post right MCA M2 vessel occlusion treated with tPA and revascularization. Intubated. EXAM: PORTABLE CHEST 1 VIEW COMPARISON:  07/11/2017 and earlier. FINDINGS: Portable AP semi upright view at 0617 hours. Stable endotracheal tube tip just below the clavicles. Stable lung volumes. Mediastinal contours remain within normal limits. Calcified aortic atherosclerosis. Prior CABG. Streaky right mid and lower lung opacity with blunting of the right costophrenic angle appears chronic and not significantly changed compared to 2016. No pneumothorax, pulmonary edema or acute pulmonary opacity. IMPRESSION: 1. Stable endotracheal tube.  2. Chronic opacity in the right lower lung. No acute cardiopulmonary abnormality. Electronically Signed   By: Genevie Ann M.D.   On: 07/12/2017 08:40   Portable Chest X-ray  Result Date: 07/11/2017 CLINICAL DATA:  Respiratory failure EXAM: PORTABLE CHEST 1 VIEW COMPARISON:  Chest x-ray dated 01/13/2015. FINDINGS: Heart size and mediastinal contours are stable in size and configuration. Aortic atherosclerosis. Median sternotomy wires appear intact. Endotracheal tube is adequately positioned with tip approximately 5 cm above the carina. Chronic small pleural effusion and/or pleural thickening at the right lung base. No new lung findings. No pneumothorax seen. IMPRESSION: 1. No active disease.  No evidence of pneumonia or pulmonary edema. 2. Chronic small right pleural effusion and/or pleural thickening, stable. 3. Aortic atherosclerosis. Electronically Signed   By: Franki Cabot M.D.   On: 07/11/2017 22:27   Ct Head Code Stroke Wo Contrast  Result Date: 07/11/2017 CLINICAL DATA:  Code stroke. 84 y/o M; left-sided deficits and right-sided gaze. EXAM: CT HEAD WITHOUT CONTRAST TECHNIQUE: Contiguous axial images were obtained from the base of the skull through the vertex without intravenous contrast. COMPARISON:  05/13/2011 CT head. FINDINGS: Brain: Stable small chronic cortical infarcts in left frontal and right parietal lobes. Stable small chronic lacunar infarct in the right caudate body and right thalamus. Chronic infarction in the left inferior cerebellar hemisphere. Background of mild chronic microvascular ischemic changes and parenchymal volume loss of the brain. Vascular: Calcific atherosclerosis of carotid siphons. Hyperdensity within right distal M1 (series 6, image 21). Skull: Normal. Negative for fracture or focal lesion. Sinuses/Orbits: Left anterior ethmoid and frontal sinus partial opacification in chronic inflammatory changes. Right intra-ocular lens replacement. Other: None. ASPECTS [facility] Stroke  Program Early CT Score) - Ganglionic level infarction (caudate, lentiform nuclei, internal capsule, insula, M1-M3 cortex): 7 - Supraganglionic infarction (M4-M6 cortex): 3 Total score (0-10 with 10 being normal): 10 IMPRESSION: 1. No acute intracranial abnormality identified. 2. ASPECTS is 10 3. Dense distal right M1, suspected thrombus. 4. Stable chronic microvascular ischemic changes, parenchymal volume loss, and small cortical infarctions. These results were called by telephone at the time of interpretation on 07/11/2017 at 6:43 pm to Dr. Lorraine Lax, who verbally acknowledged these results. Electronically Signed   By: Kristine Garbe M.D.   On: 07/11/2017 18:43   TTE  - Left ventricle: The cavity size was normal. Systolic function was   normal. The estimated ejection fraction was in the range of 55%   to 60%. Wall motion was normal; there were no regional wall   motion abnormalities. Features are consistent with a pseudonormal   left ventricular filling pattern, with concomitant abnormal   relaxation and increased filling pressure (grade 2 diastolic   dysfunction). - Aortic valve: Transvalvular velocity was within  the normal range.   There was no stenosis. There was no regurgitation. - Aorta: Ascending aortic diameter: 38 mm (S). - Ascending aorta: The ascending aorta was mildly dilated. - Mitral valve: Prior procedures included surgical repair.   Transvalvular velocity was within the normal range. There was no   evidence for stenosis. There was mild regurgitation. Valve area   by pressure half-time: 2.29 cm^2. - Left atrium: The atrium was moderately dilated. - Right ventricle: The cavity size was normal. Wall thickness was   normal. Systolic function was normal. - Atrial septum: No defect or patent foramen ovale was identified. - Tricuspid valve: There was mild regurgitation. - Pulmonary arteries: Systolic pressure was severely increased. PA   peak pressure: 56 mm Hg (S).  PORTABLE  CHEST 1 VIEW  07/13/2017 09:35 IMPRESSION: 1. Low volumes but stable inflation after extubation. 2. Atelectasis or bronchopneumonia at the bases.  PHYSICAL EXAM  Temp:  [98.3 F (36.8 C)-100.6 F (38.1 C)] 100.6 F (38.1 C) (01/31 1418) Pulse Rate:  [69-74] 73 (01/31 1418) Resp:  [18-22] 18 (01/31 1418) BP: (130-161)/(59-83) 136/64 (01/31 1418) SpO2:  [95 %-97 %] 96 % (01/31 1418)  General - Well nourished, well developed, not in acute distress.  Ophthalmologic - fundi not visualized due to noncooperation.  Cardiovascular - irregularly irregular heart rate and rhythm.  Neuro - awake alert, sitting in chair. Able to follow simple commands, moderate dysarthria, paucity of speech, able to name 3/4 and repeat simple sentences. Left eyelid mild ptosis with bloody secretions. PERRL, right gaze preference, barely cross midline to left. Blinking to visual threat on the right but not on the left. Facial symmetry difficult to exam due to ET tube. RUE and RLE 5/5, spontaneous purposeful movement. LUE and LLE hemiplegic. On pain stimulation, mild withdraw on the LLE. DTR 1+ and no babinski. Sensation, coordination and gait not tested.   ASSESSMENT/PLAN Mr. JOVEN MOM is a 84 y.o. 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 TPA given    Stroke:  right MCA and ACA patchy scattered moderate to large infarct s/p IV and IA tPA, embolic secondary to afib on coumadin with subtherapeutic INR  Resultant left hemiplegia, right gaze preference  CTA head and neck - right M2 cut off, b/l ICA proximal 50% stenosis  DSA right M2 recanalized after IV tPA, right distal ACA recanalized after IA tPA  MRI  Right MCA and ACA acute infarcts, old left large cerebellar infarct  MRA  Right MCA recanalized after IR  2D Echo EF  55-60%  LDL 66  HgbA1c 5.6  lovenox for VTE prophylaxis   warfarin daily prior to admission, now on ASA 325 mg.   Ongoing aggressive stroke risk factor management  Therapy recommendations:  CIR  Disposition:  CIR  Afib on coumadin  INR 1.48 on admission  Follows with Dr. Martinique cardiology  Hold off [facility] for now due to moderate to large right MCA and ACA infarcts  Rate under control  Will consider resume AC in 5-7 days  Will consider eliquis, discussed with Dr. Martinique who is in agreement.   History of stroke  05/2011 left large cerebellar infarct  Concerning for cardiac source  Put on DAPT at that time  recommended outpt TEE and long term heart monitoring at that time  Hypertension Stable  Long term BP goal normotensive  Hyperlipidemia  Home meds:  crestor   LDL 66, goal < 70  Resumed crestor 20  Continue statin at discharge  Other Stroke Risk Factors  Advanced age  Coronary artery disease s/p CABG  LAA thrombus S/p MAZE  Other Active Problems  Hypokalemia - replacement in progress, repeat labs in AM  Atelectasis vs Bronchopneumonia at bases - Will obtain CXR  Fever - no leukocytosis. Will obtain CXR and repeat U/A  Left eyelid ulcer - eye drops and eye care in progress  Hospital day # [place], [place] Stroke Neurology Team 07/14/2017 3:08 PM  ATTENDING NOTE: I reviewed above note and agree with the assessment and plan. I have made any additions or clarifications directly to the above note. Pt was seen and examined.   PT was seen in the afternoon, lethargic and sleeping with sign of OSA. However, as per wife, pt was more awake alert and worked with PT/OT well. Sitting in chair for long time and just had bath, so was worn out. He had low grade fever intermittent today, UA and CXR unrevealing. Will put on empirical antibiotics. Discussed with Dr. Martinique and will re-start [facility] with eliquis in 5 days. Pending CIR.   Rosalin Hawking, MD  PhD Stroke Neurology 07/14/2017 11:01 PM     To contact Stroke Continuity provider, please refer to [email or web address] After hours, contact General Neurology

## 2017-07-14 NOTE — Progress Notes (Signed)
Physical Therapy Treatment Patient Details Name: Arthur [name] MRN: [id number] DOB: 12-01-1928 Today's Date: 07/14/2017    History of Present Illness 84 y.o.malewith hx of afib, LAA thrombus s/p MAZE procedure, CAD s/p CABG, CVA in 05/2011 with left large cerebellar infarct, HTN, HLDadmitted for left hemiplegia and right gaze, left neglect and left facial droop.TPA given. Work up revealed rightMCA and ACA patchy scattered moderate to largeinfarct s/p IV and IA    PT Comments    Pt progressing towards physical therapy goals. Noted pt able to track therapist to the L and maintain for ~3 seconds this session before eyes jumped back to midline. Wife educated on encouraging attention to the L. Pt was able to tolerate ~8 minutes EOB trunk stability activity, with increased L lateral lean as pt fatigued. At end of session, pt was positioned in chair with pillows propped behind bilateral shoulders to promote upright posture, and pillow under L UE for shoulder support. PRAFO boots donned for positioning as well. Will continue to follow and progress as able per POC.    Follow Up Recommendations  CIR;Supervision/Assistance - 24 hour     Equipment Recommendations  Other (comment)(TBD by next venue of care)    Recommendations for Other Services Rehab consult     Precautions / Restrictions Precautions Precautions: Fall Precaution Comments: L neglect Restrictions Weight Bearing Restrictions: No    Mobility  Bed Mobility Overal bed mobility: Needs Assistance Bed Mobility: Rolling;Sidelying to Sit;Supine to Sit Rolling: Max assist;+2 for physical assistance Sidelying to sit: Max assist;+2 for physical assistance       General bed mobility comments: Hand-over-hand assist to reach for bed rail for support. Max +2 assist for transition to EOB with bed pad for scooting.  Transfers Overall transfer level: Needs assistance Equipment used: 2 person hand held assist Transfers: Sit to/from  [facility] Sit to Stand: Max assist;+2 physical assistance;From elevated surface Stand pivot transfers: Total assist;+2 physical assistance;From elevated surface       General transfer comment: Noted some clonus in sitting after ~2 minutes. Able to be dissipated with light pressure at knee. Bed pad used to assist pt into standing x2, and squat pivot transfer initiated on second stand.  Ambulation/Gait             General Gait Details: Unable   Stairs            Wheelchair Mobility    Modified Rankin (Stroke Patients Only) Modified Rankin (Stroke Patients Only) Pre-Morbid Rankin Score: No symptoms Modified Rankin: Severe disability     Balance Overall balance assessment: Needs assistance Sitting-balance support: Single extremity supported;Feet supported Sitting balance-Leahy Scale: Poor Sitting balance - Comments: patient continues to show posterior bias but less extensor tone noted this session Postural control: Posterior lean   Standing balance-Leahy Scale: Zero                              Cognition Arousal/Alertness: Awake/alert Behavior During Therapy: WFL for tasks assessed/performed Overall Cognitive Status: Impaired/Different from baseline Area of Impairment: Following commands;Safety/judgement;Awareness;Problem solving;Attention                   Current Attention Level: Sustained   Following Commands: Follows one step commands with increased time Safety/Judgement: Decreased awareness of deficits Awareness: Intellectual Problem Solving: Requires verbal cues;Requires tactile cues;Slow processing General Comments: patient continues to have some decreased intiation and slow processing during session. Remains conversational throughout session  although language appears to require increased effort.       Exercises      General Comments        Pertinent Vitals/Pain Pain Assessment: No/denies pain    Home  Living                      Prior Function            PT Goals (current goals can now be found in the care plan section) Acute Rehab PT Goals Patient Stated Goal: none stated PT Goal Formulation: With patient/family Time For Goal Achievement: 07/26/17 Potential to Achieve Goals: Fair Progress towards PT goals: Progressing toward goals    Frequency    Min 4X/week      PT Plan Current plan remains appropriate    Co-evaluation              AM-PAC PT "6 Clicks" Daily Activity  Outcome Measure  Difficulty turning over in bed (including adjusting bedclothes, sheets and blankets)?: Unable Difficulty moving from lying on back to sitting on the side of the bed? : Unable Difficulty sitting down on and standing up from a chair with arms (e.g., wheelchair, bedside commode, etc,.)?: Unable Help needed moving to and from a bed to chair (including a wheelchair)?: Total Help needed walking in hospital room?: Total Help needed climbing 3-5 steps with a railing? : Total 6 Click Score: 6    End of Session Equipment Utilized During Treatment: Gait belt Activity Tolerance: Patient tolerated treatment well Patient left: in chair;with call bell/phone within reach Nurse Communication: Mobility status PT Visit Diagnosis: Other symptoms and signs involving the nervous system (R29.898);Hemiplegia and hemiparesis Hemiplegia - Right/Left: Left Hemiplegia - dominant/non-dominant: Dominant Hemiplegia - caused by: Cerebral infarction     Time: 1136-1220 PT Time Calculation (min) (ACUTE ONLY): 44 min  Charges:  $Therapeutic Activity: 38-52 mins                    G Codes:       Rolinda Roan, PT, DPT Acute Rehabilitation Services Pager: [phone]    Thelma Comp 07/14/2017, 12:47 PM

## 2017-07-14 NOTE — Telephone Encounter (Signed)
Dr.Jordan spoke to Dr.Xu.

## 2017-07-14 NOTE — Telephone Encounter (Signed)
Left message to call back  

## 2017-07-14 NOTE — H&P (Signed)
Physical Medicine and Rehabilitation Admission H&P    Chief Complaint  Patient presents with  . Functional deficits due to Stroke    HPI:  Arthur [name] is a 85 y.o. male with history of CKD, CAD s/p CABG, CVA, A fib s/p MAZE- chronic coumadin who was admitted on 07/11/17 with left sided weakness with right gaze preference. INR subtherapeutic at admission and CTA head neck showed calcified plaque of aorta and carotid bifurcation, right M2 superior division proximal occlusion with poor down stream collateralization and multiple areas of mild to moderate intracranial atherosclerosis. He underwent cerebral angio with complete revascularization of R-ACA with IA tPA and IV tPA.  He tolerated extubation without difficulty on 1/29.  MRI/MRA brain done revealing acute moderate R-MCA, small right distal ACA infarcts, small right posterior watershed territory infarct with minimal petechial hemorrhage and old large left cerebellar infarct with successful revascularization of R-MCA occlusion.   MBS done yesterday and he was placed on  Dysphagia 1, nectar liquids. He developed fever yesterday pm with leucocytosis and CXR showed low lung volumes. UA negative and he was started on Levaquin due to concerns of aspiration PNA.  Stroke felt to be embolic due to subtherapeutic coumadin. Anticoagulation on hold due to size of stroke--now on ASA with recommendations to resume anticoagulation in 5-7 days.   Patient with resultant left hemiparesis, left inattention with right gaze preference, dysphagia and left facial droop. Therapy ongoing and CIR recommended due to functional deficits.     Review of Systems  Constitutional: Negative for chills and fever.  HENT: Negative for hearing loss and tinnitus.   Eyes: Positive for redness. Negative for blurred vision, photophobia and pain.       Drainage left eye  Respiratory: Negative for cough, shortness of breath and wheezing.   Cardiovascular: Negative for chest pain  and palpitations.  Gastrointestinal: Negative for constipation, heartburn and nausea.  Musculoskeletal: Positive for joint pain (chronic right knee pain due to old injury).  Skin: Negative for itching and rash.  Neurological: Positive for speech change and focal weakness. Negative for dizziness and headaches.  Psychiatric/Behavioral: Negative for depression. The patient does not have insomnia.       Past Medical History:  Diagnosis Date  . Arthritis    "some in my joints" (01/24/2015)  . CKD (chronic kidney disease), stage II   . Coronary artery disease    a. s/p CABG in 2005 with MV repair and MAZE procedure. b. s/p DES to prox RCA 01/2015.   Marland Kitchen CVA (cerebrovascular accident) [facility]) ~ 2014   RIGHT BRAIN; denies residual on 01/24/2015  . Esophagitis    Distal esophagitis  . GERD (gastroesophageal reflux disease)   . Heart murmur   . History of hiatal hernia   . History of recurrent TIAs   . Hyperlipidemia   . Hypertension   . Hypertensive vascular disease   . MVP (mitral valve prolapse)   . Nephrolithiasis   . Odynophagia   . PAF (paroxysmal atrial fibrillation) ([place])   . Transudative pleural effusion     Past Surgical History:  Procedure Laterality Date  . APPENDECTOMY  1960's  . CARDIAC CATHETERIZATION  03/19/2004  . CARDIAC CATHETERIZATION N/A 01/24/2015   Procedure: Left Heart Cath and Coronary Angiography;  Surgeon: Peter M Martinique, MD;  Location: [place] CV LAB;  Service: Cardiovascular;  Laterality: N/A;  . CARDIAC CATHETERIZATION N/A 01/24/2015   Procedure: Coronary Stent Intervention;  Surgeon: Peter M Martinique, MD;  Location: [facility]  INVASIVE CV LAB;  Service: Cardiovascular;  Laterality: N/A;  . CORONARY ANGIOPLASTY WITH STENT PLACEMENT  01/24/2015   "1 stent"  . CORONARY ARTERY BYPASS GRAFT  04/2004   LIMA GRAFT TO THE DISTAL LAD, SAPHENOUS VEIN GRAFT TO THE FIRST DIADGONAL BRANCH, SAPHENOUS VEIN GRAFT TO THE THIRD MARIGINAL BRANCH, AND SAPHENOUS VEIN GRAFT TO THE PDA  .  IR ANGIO VERTEBRAL SEL SUBCLAVIAN INNOMINATE UNI R MOD SED  07/11/2017  . IR PERCUTANEOUS ART THROMBECTOMY/INFUSION INTRACRANIAL INC DIAG ANGIO  07/11/2017  . MAZE  04/2004  . MITRAL VALVE REPAIR  04/2004  . RADIOLOGY WITH ANESTHESIA N/A 07/11/2017   Procedure: RADIOLOGY WITH ANESTHESIA;  Surgeon: Luanne Bras, MD;  Location: [place];  Service: Radiology;  Laterality: N/A;  . TEE WITHOUT CARDIOVERSION  05/19/2011   Procedure: TRANSESOPHAGEAL ECHOCARDIOGRAM (TEE);  Surgeon: Peter Martinique, MD;  Location: [facility] ENDOSCOPY;  Service: Cardiovascular;  Laterality: N/A;  . TONSILLECTOMY AND ADENOIDECTOMY  1944    Family History  Problem Relation Age of Onset  . Heart attack Mother   . Stroke Father   . Hypertension Father   . Kidney failure Father     Social History:  Married. Retired Hotel manager. Was independent and relatively active per wife. Per reports that he quit smoking about 62 years ago. His smoking use included cigarettes. He quit after 3.00 years of use. he has never used smokeless tobacco. He reports that he does not drink alcohol or use drugs.    Allergies  Allergen Reactions  . Tape Other (See Comments)    PATIENT IS TAKING COUMADIN; HIS SKIN TEARS & BRUISES EASILY; PLEASE USE COBAN WRAP OR AN ALTERNATIVE TO MEDICAL TAPE!!  . Lasix [Furosemide] Rash  . Penicillins Rash    Has patient had a PCN reaction causing immediate rash, facial/tongue/throat swelling, SOB or lightheadedness with hypotension: Yes Has patient had a PCN reaction causing severe rash involving mucus membranes or skin necrosis: Unk Has patient had a PCN reaction that required hospitalization: Unk Has patient had a PCN reaction occurring within the last 10 years:  If all of the above answers are "NO", then may proceed with Cephalosporin use.   . Sulfa Drugs Cross Reactors Rash    Medications Prior to Admission  Medication Sig Dispense Refill  . acetaminophen (TYLENOL) 650 MG CR tablet Take 650 mg by mouth daily  as needed (for knee pain).    Marland Kitchen amLODipine (NORVASC) 5 MG tablet TAKE ONE TABLET BY MOUTH DAILY (Patient taking differently: Take 5 mg by mouth once a day) 90 tablet 1  . benazepril (LOTENSIN) 40 MG tablet TAKE ONE TABLET BY MOUTH DAILY (Patient taking differently: Take 40 mg by mouth once a day) 78 tablet 3  . carvedilol (COREG) 25 MG tablet TAKE ONE TABLET BY MOUTH TWICE A DAY (Patient taking differently: Take 25 mg by mouth two times a day) 60 tablet 5  . NITROSTAT 0.4 MG SL tablet PLACE 1 TABLET (0.4 MG TOTAL) UNDER THE TONGUE EVERY 5 (FIVE) MINUTESAS NEEDED FOR CHEST PAIN (UP TO 3 DOSES). 25 tablet 2  . ranitidine (ZANTAC) 150 MG tablet TAKE ONE TABLET BY MOUTH TWICE A DAY (Patient taking differently: Take 150 mg by mouth two times a day) 180 tablet 2  . rosuvastatin (CRESTOR) 20 MG tablet TAKE 1 TABLET BY MOUTH DAILY (Patient taking differently: Take 20 mg by mouth at bedtime) 90 tablet 2  . warfarin (COUMADIN) 2.5 MG tablet TAKE 1/2 TO 1 TABLET BY MOUTH DAILY AS DIRECTED BY  COUMADIN CLINIC (Patient taking differently: Take 1.25 mg by mouth at bedtime on Sun/Tues/Thurs/Sat and 2.5 mg on Mon/Wed/Fri) 30 tablet 2  . warfarin (COUMADIN) 2.5 MG tablet TAKE 1/2 TO 1 TABLET BY MOUTH DAILY AS DIRECTED BY COUMADIN CLINIC (Patient not taking: Reported on 07/11/2017) 90 tablet 1    Drug Regimen Review  Drug regimen was reviewed and remains appropriate with no significant issues identified  Home: Home Living Family/patient expects to be discharged to:: Inpatient rehab Living Arrangements: Spouse/significant other Available Help at Discharge: (wife avialble 24/7; no children alive) Type of Home: House Home Access: Stairs to enter Technical brewer of Steps: 4 Entrance Stairs-Rails: Left Home Layout: One level Bathroom Shower/Tub: Multimedia programmer: Programmer, systems: Yes Home Equipment: Wheelchair - manual  Lives With: Spouse   Functional History: Prior  Function Level of Independence: Independent Comments: ADLs, IADLs, driving  Functional Status:  Mobility: Bed Mobility Overal bed mobility: Needs Assistance Bed Mobility: Rolling, Sidelying to Sit, Supine to Sit Rolling: Max assist, +2 for physical assistance Sidelying to sit: Max assist, +2 for physical assistance Supine to sit: Total assist, +2 for physical assistance General bed mobility comments: Hand-over-hand assist to reach for bed rail for support. Max +2 assist for transition to EOB with bed pad for scooting. Transfers Overall transfer level: Needs assistance Equipment used: 2 person hand held assist Transfers: Sit to/from Stand, Stand Pivot Transfers Sit to Stand: Max assist, +2 physical assistance, From elevated surface Stand pivot transfers: Total assist, +2 physical assistance, From elevated surface General transfer comment: Noted some clonus in sitting after ~2 minutes. Able to be dissipated with light pressure at knee. Bed pad used to assist pt into standing x2, and squat pivot transfer initiated on second stand. Ambulation/Gait General Gait Details: Unable    ADL: ADL Overall ADL's : Needs assistance/impaired Eating/Feeding: NPO Grooming: Wash/dry face, Maximal assistance, Sitting Grooming Details (indicate cue type and reason): Max A to maintain sitting at EOB. Pt washing his face with his RUE requiring increased time for processing.  Upper Body Bathing: Moderate assistance, Bed level(Supported sitting) Lower Body Bathing: Maximal assistance, +2 for physical assistance, Sit to/from stand, Bed level Upper Body Dressing : Moderate assistance, Bed level(Supported sitting) Lower Body Dressing: Maximal assistance, Bed level, Sit to/from stand, +2 for physical assistance Lower Body Dressing Details (indicate cue type and reason): don socks with Max A Functional mobility during ADLs: Maximal assistance, +2 for physical assistance(Sit<>stand only) General ADL Comments: Pt  demonstrating decreased fucntional performance with poor functional use of LUE, vision, balance, and cognition.   Cognition: Cognition Overall Cognitive Status: Impaired/Different from baseline Arousal/Alertness: Awake/alert Orientation Level: Oriented X4 Attention: Selective Selective Attention: Appears intact Memory: Appears intact Awareness: Impaired Executive Function: Sequencing Sequencing: Impaired Sequencing Impairment: Verbal complex Behaviors: Lability Cognition Arousal/Alertness: Awake/alert Behavior During Therapy: WFL for tasks assessed/performed Overall Cognitive Status: Impaired/Different from baseline Area of Impairment: Following commands, Safety/judgement, Awareness, Problem solving, Attention Current Attention Level: Sustained Following Commands: Follows one step commands with increased time Safety/Judgement: Decreased awareness of deficits Awareness: Intellectual Problem Solving: Requires verbal cues, Requires tactile cues, Slow processing General Comments: patient continues to have some decreased intiation and slow processing during session. Remains conversational throughout session although language appears to require increased effort.    Blood pressure (!) 142/65, pulse 79, temperature 98.6 F (37 C), temperature source Oral, resp. rate 18, height 5' 8"  (1.727 m), weight 80.2 kg (176 lb 12.9 oz), SpO2 99 %. Physical Exam  Vitals reviewed. Constitutional:  He is oriented to person, place, and time. He appears well-developed and well-nourished. No distress.  Up in a chair. Fatigued appearing.   HENT:  Head: Normocephalic and atraumatic.  Eyes: Pupils are equal, round, and reactive to light. Left eye exhibits discharge. Left conjunctiva is injected.  Left ptosis with ectropion and inability to move eye to left field. Crusting noted on left eyelids.   Neck: Normal range of motion. Neck supple.  Cardiovascular: Normal rate. An irregular rhythm present.    Respiratory: Effort normal. No tachypnea. He has decreased breath sounds. He has no wheezes. He exhibits no tenderness.  GI: Soft. Bowel sounds are normal. There is no tenderness.  Musculoskeletal: He exhibits no edema or tenderness.  Neurological: He is alert and oriented to person, place, and time.  Left facial weakness with ptosis and occasional wet sounds due to holding of saliva. Ataxic/ dysarthric speech. Left inattention but able to attend to left with cues. Dense left hemiparesis with flexor tone LUE and extensor tone LLE. Left heel cord tight.   Skin: Skin is warm and dry. He is not diaphoretic.  Psychiatric: His affect is blunt. His speech is delayed and slurred. He is slowed.    Results for orders placed or performed during the hospital encounter of 07/11/17 (from the past 48 hour(s))  Urinalysis, Routine w reflex microscopic     Status: Abnormal   Collection Time: 07/14/17  4:18 PM  Result Value Ref Range   Color, Urine YELLOW YELLOW   APPearance CLEAR CLEAR   Specific Gravity, Urine 1.013 1.005 - 1.030   pH 6.0 5.0 - 8.0   Glucose, UA 50 (A) NEGATIVE mg/dL   Hgb urine dipstick MODERATE (A) NEGATIVE   Bilirubin Urine NEGATIVE NEGATIVE   Ketones, ur 20 (A) NEGATIVE mg/dL   Protein, ur 30 (A) NEGATIVE mg/dL   Nitrite NEGATIVE NEGATIVE   Leukocytes, UA NEGATIVE NEGATIVE   RBC / HPF 6-30 0 - 5 RBC/hpf   WBC, UA 0-5 0 - 5 WBC/hpf   Bacteria, UA RARE (A) NONE SEEN   Squamous Epithelial / LPF NONE SEEN NONE SEEN   Mucus PRESENT   Magnesium     Status: None   Collection Time: 07/15/17  3:51 AM  Result Value Ref Range   Magnesium 1.9 1.7 - 2.4 mg/dL    Comment: Performed at [facility], [address] [place], [place] [address]  Phosphorus     Status: Abnormal   Collection Time: 07/15/17  3:51 AM  Result Value Ref Range   Phosphorus 1.8 (L) 2.5 - 4.6 mg/dL    Comment: Performed at [facility] [address], [place], [place] [address]  CBC     Status:  Abnormal   Collection Time: 07/15/17  3:51 AM  Result Value Ref Range   WBC 10.9 (H) 4.0 - 10.5 K/uL   RBC 4.22 4.22 - 5.81 MIL/uL   Hemoglobin 12.6 (L) 13.0 - 17.0 g/dL   HCT 39.3 39.0 - 52.0 %   MCV 93.1 78.0 - 100.0 fL   MCH 29.9 26.0 - 34.0 pg   MCHC 32.1 30.0 - 36.0 g/dL   RDW 13.8 11.5 - 15.5 %   Platelets 187 150 - 400 K/uL    Comment: Performed at [facility], [place] [address], [place], [place] [address]  Basic metabolic panel     Status: Abnormal   Collection Time: 07/15/17  3:51 AM  Result Value Ref Range   Sodium  142 135 - 145 mmol/L   Potassium 4.4 3.5 - 5.1 mmol/L   Chloride 112 (H) 101 - 111 mmol/L   CO2 20 (L) 22 - 32 mmol/L   Glucose, Bld 113 (H) 65 - 99 mg/dL   BUN 19 6 - 20 mg/dL   Creatinine, Ser 1.14 0.61 - 1.24 mg/dL   Calcium 8.9 8.9 - 10.3 mg/dL   GFR calc non Af Amer 56 (L) >60 mL/min   GFR calc Af Amer >60 >60 mL/min    Comment: (NOTE) The eGFR has been calculated using the CKD EPI equation. This calculation has not been validated in all clinical situations. eGFR's persistently <60 mL/min signify possible Chronic Kidney Disease.    Anion gap 10 5 - 15    Comment: Performed at [facility] [address], [place], [place] [address]   Dg Chest Port 1 View  Result Date: 07/14/2017 CLINICAL DATA:  84 year old male with a history of fever EXAM: PORTABLE CHEST 1 VIEW COMPARISON:  07/13/2017 FINDINGS: Cardiomediastinal silhouette unchanged in size and contour. Surgical changes of median sternotomy and CABG, as well as valve repair. Low lung volumes with crowded interstitial markings. No pneumothorax. Thickening at the pleuroparenchymal interface on the right. No new confluent airspace disease. Linear opacity in the right mid lung, likely atelectasis. No acute displaced fracture. IMPRESSION: Low lung volumes with basilar atelectasis, and no evidence of new focal airspace disease. Pleuroparenchymal thickening at the periphery of the right lung, may  reflect small pleural fluid versus scarring. Electronically Signed   By: Corrie Mckusick D.O.   On: 07/14/2017 17:02   Dg Swallowing Func-speech Pathology  Result Date: 07/14/2017 Objective Swallowing Evaluation: Type of Study: MBS-Modified Barium Swallow Study  Patient Details Name: LITTLE WINTON MRN: [id number] Date of Birth: 1928/11/01 Today's Date: 07/14/2017 Time: SLP Start Time (ACUTE ONLY): 0900 -SLP Stop Time (ACUTE ONLY): 0923 SLP Time Calculation (min) (ACUTE ONLY): 23 min Past Medical History: Past Medical History: Diagnosis Date . Arthritis   "some in my joints" (01/24/2015) . CKD (chronic kidney disease), stage II  . Coronary artery disease   a. s/p CABG in 2005 with MV repair and MAZE procedure. b. s/p DES to prox RCA 01/2015.  Marland Kitchen CVA (cerebrovascular accident) [facility] ~ 2014  RIGHT BRAIN; denies residual on 01/24/2015 . Esophagitis   Distal esophagitis . GERD (gastroesophageal reflux disease)  . Heart murmur  . History of hiatal hernia  . History of recurrent TIAs  . Hyperlipidemia  . Hypertension  . Hypertensive vascular disease  . MVP (mitral valve prolapse)  . Nephrolithiasis  . Odynophagia  . PAF (paroxysmal atrial fibrillation) ([place])  . Transudative pleural effusion  Past Surgical History: Past Surgical History: Procedure Laterality Date . APPENDECTOMY  1960's . CARDIAC CATHETERIZATION  03/19/2004 . CARDIAC CATHETERIZATION N/A 01/24/2015  Procedure: Left Heart Cath and Coronary Angiography;  Surgeon: Peter M Martinique, MD;  Location: [place] CV LAB;  Service: Cardiovascular;  Laterality: N/A; . CARDIAC CATHETERIZATION N/A 01/24/2015  Procedure: Coronary Stent Intervention;  Surgeon: Peter M Martinique, MD;  Location: [place] CV LAB;  Service: Cardiovascular;  Laterality: N/A; . CORONARY ANGIOPLASTY WITH STENT PLACEMENT  01/24/2015  "1 stent" . CORONARY ARTERY BYPASS GRAFT  04/2004  LIMA GRAFT TO THE DISTAL LAD, SAPHENOUS VEIN GRAFT TO THE FIRST DIADGONAL BRANCH, SAPHENOUS VEIN GRAFT TO THE THIRD  MARIGINAL BRANCH, AND SAPHENOUS VEIN GRAFT TO THE PDA . IR ANGIO VERTEBRAL SEL SUBCLAVIAN INNOMINATE UNI R MOD SED  07/11/2017 .  IR PERCUTANEOUS ART THROMBECTOMY/INFUSION INTRACRANIAL INC DIAG ANGIO  07/11/2017 . MAZE  04/2004 . MITRAL VALVE REPAIR  04/2004 . RADIOLOGY WITH ANESTHESIA N/A 07/11/2017  Procedure: RADIOLOGY WITH ANESTHESIA;  Surgeon: Luanne Bras, MD;  Location: [place];  Service: Radiology;  Laterality: N/A; . TEE WITHOUT CARDIOVERSION  05/19/2011  Procedure: TRANSESOPHAGEAL ECHOCARDIOGRAM (TEE);  Surgeon: Peter Martinique, MD;  Location: [facility] ENDOSCOPY;  Service: Cardiovascular;  Laterality: N/A; . TONSILLECTOMY AND ADENOIDECTOMY  1944 HPI: 84 y.o.malewith history of afib on coumadin, LAA thrombus s/p MAZE procedure, CAD s/p CABG, stroke in 05/2011 with left large cerebellar infarct, HTN, HLDadmitted for left hemiplegia and right gaze, left neglect and left facial droop.TPA given. Successful revascularization Rt MCA on 1/29. MRI rightMCA and ACA patchy scattered moderate to largeinfarct; old large LEFT cerebellar infarct, old small LEFT frontal/MCA territory and RIGHT parietoccipital/posterior watershed territory infarcts.  Subjective: alert Assessment / Plan / Recommendation CHL IP CLINICAL IMPRESSIONS 07/14/2017 Clinical Impression Pt demonstrates a moderate oral dysphagia with weak labial and buccal tension the left with buccal and lingual residuals post swallow. oral deficits lead to spillage and increased latency between spill of liquids to pyriforms and swallow initiation.   Spillage wIth thin liquids, along with base of tongue weakness and vallecular residue, results in trace sensed aspriation events with thin liquids. Nectar thick liquids are toelrated more consistently though a second swallow is needed to clear residuals. A baseline structural impediment (appearance of osteophytic changes at C6/7) also poses increased resistance to bolus transit, though negative pressure is very good with no  pyriform sinus residue observed. Greatest concern for safety is potential fatigue with increased residue and inconsistent ability to follow strategy of second swallow. Pt will need close supervision with Dys 1/nectar thick liquids. Recommend trialing CTAR exercise during acure stay.  SLP Visit Diagnosis Dysphagia, oropharyngeal phase (R13.12) Attention and concentration deficit following -- Frontal lobe and executive function deficit following -- Impact on safety and function Moderate aspiration risk   CHL IP TREATMENT RECOMMENDATION 07/14/2017 Treatment Recommendations Therapy as outlined in treatment plan below   Prognosis 07/14/2017 Prognosis for Safe Diet Advancement Good Barriers to Reach Goals -- Barriers/Prognosis Comment -- CHL IP DIET RECOMMENDATION 07/14/2017 SLP Diet Recommendations Dysphagia 1 (Puree) solids;Nectar thick liquid Liquid Administration via Straw Medication Administration Crushed with puree Compensations Slow rate;Small sips/bites;Minimize environmental distractions;Multiple dry swallows after each bite/sip Postural Changes Seated upright at 90 degrees   CHL IP OTHER RECOMMENDATIONS 07/14/2017 Recommended Consults -- Oral Care Recommendations Oral care BID Other Recommendations Order thickener from pharmacy   CHL IP FOLLOW UP RECOMMENDATIONS 07/14/2017 Follow up Recommendations Inpatient Rehab   CHL IP FREQUENCY AND DURATION 07/14/2017 Speech Therapy Frequency (ACUTE ONLY) min 2x/week Treatment Duration 2 weeks      CHL IP ORAL PHASE 07/14/2017 Oral Phase Impaired Oral - Pudding Teaspoon -- Oral - Pudding Cup -- Oral - Honey Teaspoon -- Oral - Honey Cup -- Oral - Nectar Teaspoon -- Oral - Nectar Cup Lingual/palatal residue;Left pocketing in lateral sulci;Reduced posterior propulsion;Decreased bolus cohesion;Premature spillage;Delayed oral transit;Piecemeal swallowing Oral - Nectar Straw Lingual/palatal residue;Left pocketing in lateral sulci;Reduced posterior propulsion;Decreased bolus  cohesion;Premature spillage;Delayed oral transit;Piecemeal swallowing Oral - Thin Teaspoon -- Oral - Thin Cup Lingual/palatal residue;Left pocketing in lateral sulci;Reduced posterior propulsion;Decreased bolus cohesion;Premature spillage;Delayed oral transit;Piecemeal swallowing Oral - Thin Straw -- Oral - Puree Lingual/palatal residue;Reduced posterior propulsion;Decreased bolus cohesion Oral - Mech Soft Lingual/palatal residue;Reduced posterior propulsion;Decreased bolus cohesion;Left pocketing in lateral sulci;Weak lingual manipulation;Impaired mastication;Delayed oral transit Oral - Regular --  Oral - Multi-Consistency -- Oral - Pill -- Oral Phase - Comment --  CHL IP PHARYNGEAL PHASE 07/14/2017 Pharyngeal Phase Impaired Pharyngeal- Pudding Teaspoon -- Pharyngeal -- Pharyngeal- Pudding Cup -- Pharyngeal -- Pharyngeal- Honey Teaspoon Delayed swallow initiation-pyriform sinuses;Pharyngeal residue - valleculae;Pharyngeal residue - posterior pharnyx;Lateral channel residue;Reduced tongue base retraction Pharyngeal -- Pharyngeal- Honey Cup -- Pharyngeal -- Pharyngeal- Nectar Teaspoon -- Pharyngeal -- Pharyngeal- Nectar Cup Delayed swallow initiation-pyriform sinuses;Pharyngeal residue - valleculae;Pharyngeal residue - posterior pharnyx;Lateral channel residue;Reduced tongue base retraction Pharyngeal -- Pharyngeal- Nectar Straw Delayed swallow initiation-pyriform sinuses;Pharyngeal residue - valleculae;Pharyngeal residue - posterior pharnyx;Lateral channel residue;Reduced tongue base retraction;Penetration/Aspiration before swallow Pharyngeal Material enters airway, remains ABOVE vocal cords then ejected out;Material does not enter airway Pharyngeal- Thin Teaspoon -- Pharyngeal -- Pharyngeal- Thin Cup Delayed swallow initiation-pyriform sinuses;Pharyngeal residue - valleculae;Pharyngeal residue - posterior pharnyx;Lateral channel residue;Reduced tongue base retraction;Penetration/Aspiration during  swallow;Penetration/Aspiration before swallow;Penetration/Apiration after swallow;Trace aspiration Pharyngeal Material enters airway, passes BELOW cords then ejected out;Material enters airway, passes BELOW cords and not ejected out despite cough attempt by patient;Material does not enter airway Pharyngeal- Thin Straw -- Pharyngeal -- Pharyngeal- Puree Pharyngeal residue - valleculae;Pharyngeal residue - posterior pharnyx;Lateral channel residue;Reduced tongue base retraction;Delayed swallow initiation-vallecula Pharyngeal -- Pharyngeal- Mechanical Soft -- Pharyngeal -- Pharyngeal- Regular -- Pharyngeal -- Pharyngeal- Multi-consistency -- Pharyngeal -- Pharyngeal- Pill -- Pharyngeal -- Pharyngeal Comment --  No flowsheet data found. No flowsheet data found. DeBlois, Katherene Ponto 07/14/2017, 12:43 PM                  Medical Problem List and Plan: 1.  Functional deficits and left hemiparesis secondary to right MCA infarct, small right ACA infarct, posterior watershed infarct due to cardio-embolic source  -admit to inpatient rehab 2.  DVT Prophylaxis/Anticoagulation: Pharmaceutical: Lovenox    -to begin eliquis in 5-7 days 3. Pain Management: tylenol prn 4. Mood: LCSW to follow for evaluation and support 5. Neuropsych: This patient is not capable of making decisions on his own behalf. 6. Skin/Wound Care: routine pressure relief measures 7. Fluids/Electrolytes/Nutrition: monitor I/O. Offer liquids between meals to maintain adequate hydration. Change IVF to nights to avoid overload 8. CAD s/p Maze/ Chronic A fib: Monitor HR bid. On ASA--to start Eliquis in 5-7 days. 9. FUO: Continue Levaquin D# 2 10 GERD: Managed with Protonix  11. CKD stage II: Baseline SCr 1.2. Avoid nephrotoxic medications and monitor lytes serially.  12. HTN: Monitor BP bid--Coreg, Lotensin, Amlodipine and  13. Malnutrition? Electrolyte abnormality: Continue Phosphorous and potassium supplement. Recheck labs in am.   14.  Irritation left eye: Continue moisturizing drop with patching at nights.    Post Admission Physician Evaluation: 1. 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 2. Patient is admitted to receive collaborative, interdisciplinary care between the physiatrist, rehab nursing staff, and therapy team. 3. Patient's level of medical complexity and substantial therapy needs in context of that medical necessity cannot be provided at a lesser intensity of care such as a SNF. 4. Patient has experienced substantial functional loss from his/her baseline which was documented above under the "Functional History" and "Functional Status" headings.  Judging by the patient's diagnosis, physical exam, and functional history, the patient has potential for functional progress which will result in measurable gains while on inpatient rehab.  These gains will be of substantial and practical use upon discharge  in facilitating mobility and self-care at the household level. 5. Physiatrist will provide 24 hour management of medical needs as well as oversight of the therapy plan/treatment  and provide guidance as appropriate regarding the interaction of the two. 6. The Preadmission Screening has been reviewed and patient status is unchanged unless otherwise stated above. 7. 24 hour rehab nursing will assist with bladder management, bowel management, safety, skin/wound care, disease management, medication administration, pain management and patient education  and help integrate therapy concepts, techniques,education, etc. 8. PT will assess and treat for/with: Lower extremity strength, range of motion, stamina, balance, functional mobility, safety, adaptive techniques and equipment, NMR, visual-spatial awareness, spasticity mgt.   Goals are: mod assist. 9. OT will assess and treat for/with: ADL's, functional mobility, safety, upper extremity strength, adaptive techniques and equipment, NMR, spasticity mgt, family ed.   Goals are: mod  assist. Therapy may  proceed with showering this patient. 10. SLP will assess and treat for/with: cognition, communication, swallowing, family education.  Goals are: mod assist. 11. Case Management and Social Worker will assess and treat for psychological issues and discharge planning. 12. Team conference will be held weekly to assess progress toward goals and to determine barriers to discharge. 13. Patient will receive at least 3 hours of therapy per day at least 5 days per week. 14. ELOS: 21-23 days       15. Prognosis:  excellent     Meredith Staggers, MD, [place]  07/15/2017  Bary Leriche, PA-C 07/15/2017

## 2017-07-14 NOTE — NC FL2 (Signed)
[place] LEVEL OF CARE SCREENING TOOL     IDENTIFICATION  Patient Name: ATIBA KIMBERLIN Birthdate: 1929-01-28 Sex: male Admission Date (Current Location): 07/11/2017  [facility] and [place] Number:  Herbalist and Address:  [facility], [place] [address] [place], [place] [address]      Provider Number: [id number]  Attending Physician Name and Address:  Rosalin Hawking, MD  Relative Name and Phone Number:  Adhvik Canady, [phone]    Current Level of Care: Hospital Recommended Level of Care: [place] Prior Approval Number:    Date Approved/Denied:   PASRR Number: [id number]  Discharge Plan: SNF    Current Diagnoses: Patient Active Problem List   Diagnosis Date Noted  . Acute left hemiparesis ([place]) 07/13/2017  . Ischemic stroke ([place]) 07/11/2017  . Acute arterial ischemic stroke, multifocal, anterior circulation ([place]) 07/11/2017  . Acute respiratory failure ([place])   . Abnormal nuclear stress test 01/24/2015  . Encounter for therapeutic drug monitoring 07/19/2013  . Long term current use of anticoagulant 05/28/2011  . Dizziness 05/14/2011  . Hyperlipidemia   . Hypertension   . Coronary artery disease   . MVP (mitral valve prolapse)   . Paroxysmal atrial fibrillation (HCC)   . Chronic renal insufficiency   . CVA (cerebrovascular accident) ([place])     Orientation RESPIRATION BLADDER Height & Weight     Situation, Place, Self, Time  Normal External catheter Weight: 176 lb 12.9 oz (80.2 kg) Height:  5\' 8"  (172.7 cm)  BEHAVIORAL SYMPTOMS/MOOD NEUROLOGICAL BOWEL NUTRITION STATUS      Continent Diet(dsy 1)  AMBULATORY STATUS COMMUNICATION OF NEEDS Skin   Extensive Assist Verbally                         Personal Care Assistance Level of Assistance  Bathing, Dressing, Feeding Bathing Assistance: Maximum assistance Feeding assistance: Limited assistance Dressing Assistance: Maximum assistance      Functional Limitations Info  Sight, Hearing, Speech Sight Info: Adequate Hearing Info: Adequate Speech Info: Impaired    SPECIAL CARE FACTORS FREQUENCY  PT (By licensed PT), OT (By licensed OT)     PT Frequency: 5x wk OT Frequency: 5x wk            Contractures Contractures Info: Not present    Additional Factors Info  Code Status, Allergies Code Status Info: Full Code Allergies Info: TAPE, LASIX FUROSEMIDE, PENICILLINS, SULFA DRUGS CROSS REACTORS           Current Medications (07/14/2017):  This is the current hospital active medication list Current Facility-Administered Medications  Medication Dose Route Frequency Provider Last Rate Last Dose  .  stroke: mapping our early stages of recovery book   Does not apply Once Aroor, Sushanth R, MD      . 0.9 %  sodium chloride infusion   Intravenous Continuous Rosalin Hawking, MD 40 mL/hr at 07/13/17 2041    . acetaminophen (TYLENOL) suppository 650 mg  650 mg Rectal Q4H PRN Costello, Mary A, NP      . acetaminophen (TYLENOL) tablet 650 mg  650 mg Oral Q4H PRN Deveshwar, Willaim Rayas, MD      . aspirin EC tablet 325 mg  325 mg Oral Daily Rosalin Hawking, MD   325 mg at 07/14/17 0810  . enoxaparin (LOVENOX) injection 40 mg  40 mg Subcutaneous Q24H Rosalin Hawking, MD   40 mg at 07/14/17 0810  . metoprolol tartrate (LOPRESSOR) injection 5  mg  5 mg Intravenous Q5 min PRN Aroor, Karena Addison R, MD      . pantoprazole (PROTONIX) EC tablet 40 mg  40 mg Oral Daily Rosalin Hawking, MD   40 mg at 07/14/17 0810  . polyvinyl alcohol (LIQUIFILM TEARS) 1.4 % ophthalmic solution 1-2 drop  1-2 drop Left Eye QID PRN Rosalin Hawking, MD   1 drop at 07/13/17 1600  . potassium chloride SA (K-DUR,KLOR-CON) CR tablet 40 mEq  40 mEq Oral BID Candise Che A, NP   40 mEq at 07/14/17 1618  . rosuvastatin (CRESTOR) tablet 20 mg  20 mg Per Tube q1800 Desai, Rahul P, PA-C   20 mg at 07/13/17 1754     Discharge Medications: Please see discharge summary for a list of discharge  medications.  Relevant Imaging Results:  Relevant Lab Results:   Additional Information SS# [id number]  Wende Neighbors, LCSW

## 2017-07-14 NOTE — Telephone Encounter (Signed)
Spoke with pt wife, she wanted to let dr [place]ique know the patient is in the hospital and has had a stroke. He is on 3w18. He seems to be getting better each day. They are asking the wife questions about anticoagulation and she has ask them to contact us. I explained to the wife she can ask [place] to consult while he is there to help with his care. Will make dr Martinique aware.

## 2017-07-15 ENCOUNTER — Inpatient Hospital Stay (HOSPITAL_COMMUNITY)
Admission: RE | Admit: 2017-07-15 | Discharge: 2017-08-03 | DRG: 057 | Disposition: A | Discharge status: Skilled Nursing Facility | Payer: Medicare Other | Source: Intra-hospital | Attending: Physical Medicine & Rehabilitation | Admitting: Physical Medicine & Rehabilitation

## 2017-07-15 ENCOUNTER — Encounter (HOSPITAL_COMMUNITY): Payer: Self-pay | Admitting: *Deleted

## 2017-07-15 ENCOUNTER — Other Ambulatory Visit: Payer: Self-pay

## 2017-07-15 DIAGNOSIS — G8114 Spastic hemiplegia affecting left nondominant side: Secondary | ICD-10-CM

## 2017-07-15 DIAGNOSIS — I480 Paroxysmal atrial fibrillation: Secondary | ICD-10-CM | POA: Diagnosis present

## 2017-07-15 DIAGNOSIS — I69322 Dysarthria following cerebral infarction: Secondary | ICD-10-CM | POA: Diagnosis not present

## 2017-07-15 DIAGNOSIS — R41841 Cognitive communication deficit: Secondary | ICD-10-CM | POA: Diagnosis not present

## 2017-07-15 DIAGNOSIS — Z87891 Personal history of nicotine dependence: Secondary | ICD-10-CM

## 2017-07-15 DIAGNOSIS — R1310 Dysphagia, unspecified: Secondary | ICD-10-CM | POA: Diagnosis present

## 2017-07-15 DIAGNOSIS — G3184 Mild cognitive impairment, so stated: Secondary | ICD-10-CM | POA: Diagnosis present

## 2017-07-15 DIAGNOSIS — M6281 Muscle weakness (generalized): Secondary | ICD-10-CM | POA: Diagnosis not present

## 2017-07-15 DIAGNOSIS — I482 Chronic atrial fibrillation: Secondary | ICD-10-CM | POA: Diagnosis present

## 2017-07-15 DIAGNOSIS — H04129 Dry eye syndrome of unspecified lacrimal gland: Secondary | ICD-10-CM | POA: Diagnosis not present

## 2017-07-15 DIAGNOSIS — R2689 Other abnormalities of gait and mobility: Secondary | ICD-10-CM | POA: Diagnosis not present

## 2017-07-15 DIAGNOSIS — Z951 Presence of aortocoronary bypass graft: Secondary | ICD-10-CM | POA: Diagnosis not present

## 2017-07-15 DIAGNOSIS — Z955 Presence of coronary angioplasty implant and graft: Secondary | ICD-10-CM

## 2017-07-15 DIAGNOSIS — H5789 Other specified disorders of eye and adnexa: Secondary | ICD-10-CM | POA: Diagnosis not present

## 2017-07-15 DIAGNOSIS — R29810 Facial weakness: Secondary | ICD-10-CM | POA: Diagnosis present

## 2017-07-15 DIAGNOSIS — D72829 Elevated white blood cell count, unspecified: Secondary | ICD-10-CM

## 2017-07-15 DIAGNOSIS — R509 Fever, unspecified: Secondary | ICD-10-CM | POA: Diagnosis present

## 2017-07-15 DIAGNOSIS — I63521 Cerebral infarction due to unspecified occlusion or stenosis of right anterior cerebral artery: Secondary | ICD-10-CM | POA: Insufficient documentation

## 2017-07-15 DIAGNOSIS — I69354 Hemiplegia and hemiparesis following cerebral infarction affecting left non-dominant side: Secondary | ICD-10-CM | POA: Diagnosis not present

## 2017-07-15 DIAGNOSIS — M7989 Other specified soft tissue disorders: Secondary | ICD-10-CM | POA: Diagnosis not present

## 2017-07-15 DIAGNOSIS — E871 Hypo-osmolality and hyponatremia: Secondary | ICD-10-CM | POA: Diagnosis not present

## 2017-07-15 DIAGNOSIS — H109 Unspecified conjunctivitis: Secondary | ICD-10-CM | POA: Diagnosis present

## 2017-07-15 DIAGNOSIS — R278 Other lack of coordination: Secondary | ICD-10-CM | POA: Diagnosis not present

## 2017-07-15 DIAGNOSIS — Z23 Encounter for immunization: Secondary | ICD-10-CM | POA: Diagnosis not present

## 2017-07-15 DIAGNOSIS — H5712 Ocular pain, left eye: Secondary | ICD-10-CM | POA: Diagnosis not present

## 2017-07-15 DIAGNOSIS — I2510 Atherosclerotic heart disease of native coronary artery without angina pectoris: Secondary | ICD-10-CM | POA: Diagnosis present

## 2017-07-15 DIAGNOSIS — K5900 Constipation, unspecified: Secondary | ICD-10-CM | POA: Diagnosis not present

## 2017-07-15 DIAGNOSIS — D62 Acute posthemorrhagic anemia: Secondary | ICD-10-CM | POA: Diagnosis present

## 2017-07-15 DIAGNOSIS — N183 Chronic kidney disease, stage 3 unspecified: Secondary | ICD-10-CM

## 2017-07-15 DIAGNOSIS — Z823 Family history of stroke: Secondary | ICD-10-CM | POA: Diagnosis not present

## 2017-07-15 DIAGNOSIS — R1312 Dysphagia, oropharyngeal phase: Secondary | ICD-10-CM | POA: Diagnosis not present

## 2017-07-15 DIAGNOSIS — I63411 Cerebral infarction due to embolism of right middle cerebral artery: Secondary | ICD-10-CM

## 2017-07-15 DIAGNOSIS — E870 Hyperosmolality and hypernatremia: Secondary | ICD-10-CM | POA: Diagnosis present

## 2017-07-15 DIAGNOSIS — Z111 Encounter for screening for respiratory tuberculosis: Secondary | ICD-10-CM | POA: Diagnosis not present

## 2017-07-15 DIAGNOSIS — I4891 Unspecified atrial fibrillation: Secondary | ICD-10-CM | POA: Diagnosis not present

## 2017-07-15 DIAGNOSIS — I129 Hypertensive chronic kidney disease with stage 1 through stage 4 chronic kidney disease, or unspecified chronic kidney disease: Secondary | ICD-10-CM | POA: Diagnosis present

## 2017-07-15 DIAGNOSIS — E785 Hyperlipidemia, unspecified: Secondary | ICD-10-CM | POA: Diagnosis not present

## 2017-07-15 DIAGNOSIS — J9600 Acute respiratory failure, unspecified whether with hypoxia or hypercapnia: Secondary | ICD-10-CM | POA: Diagnosis not present

## 2017-07-15 DIAGNOSIS — K219 Gastro-esophageal reflux disease without esophagitis: Secondary | ICD-10-CM | POA: Diagnosis not present

## 2017-07-15 DIAGNOSIS — I10 Essential (primary) hypertension: Secondary | ICD-10-CM | POA: Diagnosis not present

## 2017-07-15 DIAGNOSIS — R05 Cough: Secondary | ICD-10-CM | POA: Diagnosis not present

## 2017-07-15 DIAGNOSIS — R2681 Unsteadiness on feet: Secondary | ICD-10-CM | POA: Diagnosis not present

## 2017-07-15 DIAGNOSIS — R52 Pain, unspecified: Secondary | ICD-10-CM | POA: Diagnosis not present

## 2017-07-15 DIAGNOSIS — H10022 Other mucopurulent conjunctivitis, left eye: Secondary | ICD-10-CM | POA: Diagnosis not present

## 2017-07-15 LAB — CBC
HCT: 39.3 % (ref 39.0–52.0)
Hemoglobin: 12.6 g/dL — ABNORMAL LOW (ref 13.0–17.0)
MCH: 29.9 pg (ref 26.0–34.0)
MCHC: 32.1 g/dL (ref 30.0–36.0)
MCV: 93.1 fL (ref 78.0–100.0)
PLATELETS: 187 10*3/uL (ref 150–400)
RBC: 4.22 MIL/uL (ref 4.22–5.81)
RDW: 13.8 % (ref 11.5–15.5)
WBC: 10.9 10*3/uL — ABNORMAL HIGH (ref 4.0–10.5)

## 2017-07-15 LAB — BASIC METABOLIC PANEL
Anion gap: 10 (ref 5–15)
BUN: 19 mg/dL (ref 6–20)
CHLORIDE: 112 mmol/L — AB (ref 101–111)
CO2: 20 mmol/L — ABNORMAL LOW (ref 22–32)
Calcium: 8.9 mg/dL (ref 8.9–10.3)
Creatinine, Ser: 1.14 mg/dL (ref 0.61–1.24)
GFR calc Af Amer: >60 mL/min (ref >60)
GFR calc non Af Amer: 56 mL/min — ABNORMAL LOW (ref >60)
GLUCOSE: 113 mg/dL — AB (ref 65–99)
Potassium: 4.4 mmol/L (ref 3.5–5.1)
Sodium: 142 mmol/L (ref 135–145)

## 2017-07-15 LAB — MAGNESIUM: Magnesium: 1.9 mg/dL (ref 1.7–2.4)

## 2017-07-15 LAB — PHOSPHORUS: Phosphorus: 1.8 mg/dL — ABNORMAL LOW (ref 2.5–4.6)

## 2017-07-15 MED ORDER — ALUM & MAG HYDROXIDE-SIMETH 200-200-20 MG/5ML PO SUSP: 30.0000 mL | ORAL | Status: DC | PRN

## 2017-07-15 MED ORDER — PROCHLORPERAZINE EDISYLATE 5 MG/ML IJ SOLN: 5.0000 mg | Freq: Four times a day (QID) | INTRAMUSCULAR | Status: DC | PRN

## 2017-07-15 MED ORDER — LEVOFLOXACIN IN D5W 750 MG/150ML IV SOLN
750.0000 mg | INTRAVENOUS | Status: DC
  Administered 2017-07-16: 750 mg via INTRAVENOUS
  Filled 2017-07-15 (×2): qty 150

## 2017-07-15 MED ORDER — K PHOS MONO-SOD PHOS DI & MONO 155-852-130 MG PO TABS
250.0000 mg | ORAL_TABLET | Freq: Three times a day (TID) | ORAL | Status: DC
  Administered 2017-07-15 – 2017-07-23 (×30): 250 mg via ORAL
  Filled 2017-07-15 (×31): qty 1

## 2017-07-15 MED ORDER — BISACODYL 10 MG RE SUPP
10.0000 mg | Freq: Every day | RECTAL | Status: DC | PRN
  Administered 2017-07-19: 10 mg via RECTAL
  Filled 2017-07-15: qty 1

## 2017-07-15 MED ORDER — TRAZODONE HCL 50 MG PO TABS
25.0000 mg | ORAL_TABLET | Freq: Every evening | ORAL | Status: DC | PRN
  Administered 2017-07-16 – 2017-07-20 (×5): 50 mg via ORAL
  Filled 2017-07-15 (×7): qty 1

## 2017-07-15 MED ORDER — PROCHLORPERAZINE MALEATE 5 MG PO TABS: 5.0000 mg | ORAL_TABLET | Freq: Four times a day (QID) | ORAL | Status: DC | PRN

## 2017-07-15 MED ORDER — DIPHENHYDRAMINE HCL 12.5 MG/5ML PO ELIX: 12.5000 mg | ORAL_SOLUTION | Freq: Four times a day (QID) | ORAL | Status: DC | PRN

## 2017-07-15 MED ORDER — POLYVINYL ALCOHOL 1.4 % OP SOLN
1.0000 [drp] | Freq: Three times a day (TID) | OPHTHALMIC | Status: DC
  Administered 2017-07-15 – 2017-07-18 (×13): 2 [drp] via OPHTHALMIC
  Administered 2017-07-19: 1 [drp] via OPHTHALMIC
  Administered 2017-07-19: 2 [drp] via OPHTHALMIC
  Administered 2017-07-19: 1 [drp] via OPHTHALMIC
  Administered 2017-07-19 – 2017-07-20 (×2): 2 [drp] via OPHTHALMIC
  Administered 2017-07-20: 1 [drp] via OPHTHALMIC
  Administered 2017-07-20 – 2017-07-21 (×6): 2 [drp] via OPHTHALMIC
  Administered 2017-07-22 (×4): 1 [drp] via OPHTHALMIC
  Administered 2017-07-23 (×4): 2 [drp] via OPHTHALMIC
  Administered 2017-07-24: 1 [drp] via OPHTHALMIC
  Administered 2017-07-24 – 2017-07-25 (×4): 2 [drp] via OPHTHALMIC
  Administered 2017-07-25 – 2017-07-26 (×4): 1 [drp] via OPHTHALMIC
  Administered 2017-07-26 – 2017-07-27 (×4): 2 [drp] via OPHTHALMIC
  Administered 2017-07-27 (×3): 1 [drp] via OPHTHALMIC
  Administered 2017-07-28 – 2017-07-29 (×4): 2 [drp] via OPHTHALMIC
  Administered 2017-07-29: 1 [drp] via OPHTHALMIC
  Administered 2017-07-29: 2 [drp] via OPHTHALMIC
  Administered 2017-07-29 – 2017-07-30 (×3): 1 [drp] via OPHTHALMIC
  Administered 2017-07-30: 2 [drp] via OPHTHALMIC
  Administered 2017-07-30: 1 [drp] via OPHTHALMIC
  Administered 2017-07-31 (×3): 2 [drp] via OPHTHALMIC
  Administered 2017-07-31 – 2017-08-01 (×2): 1 [drp] via OPHTHALMIC
  Administered 2017-08-01 (×2): 2 [drp] via OPHTHALMIC
  Administered 2017-08-01 – 2017-08-02 (×2): 1 [drp] via OPHTHALMIC
  Administered 2017-08-02: 2 [drp] via OPHTHALMIC
  Administered 2017-08-02: 1 [drp] via OPHTHALMIC
  Administered 2017-08-03: 2 [drp] via OPHTHALMIC
  Filled 2017-07-15: qty 15

## 2017-07-15 MED ORDER — AMANTADINE HCL 50 MG/5ML PO SYRP
100.0000 mg | ORAL_SOLUTION | Freq: Two times a day (BID) | ORAL | Status: DC
  Administered 2017-07-15 – 2017-07-27 (×24): 100 mg via ORAL
  Filled 2017-07-15 (×24): qty 10

## 2017-07-15 MED ORDER — ASPIRIN EC 325 MG PO TBEC
325.0000 mg | DELAYED_RELEASE_TABLET | Freq: Every day | ORAL | Status: DC
  Administered 2017-07-16 – 2017-07-18 (×3): 325 mg via ORAL
  Filled 2017-07-15 (×3): qty 1

## 2017-07-15 MED ORDER — AMANTADINE HCL 50 MG/5ML PO SYRP
100.0000 mg | ORAL_SOLUTION | Freq: Two times a day (BID) | ORAL | Status: DC
  Administered 2017-07-15: 100 mg via ORAL
  Filled 2017-07-15 (×2): qty 10

## 2017-07-15 MED ORDER — PROCHLORPERAZINE 25 MG RE SUPP: 12.5000 mg | Freq: Four times a day (QID) | RECTAL | Status: DC | PRN

## 2017-07-15 MED ORDER — GUAIFENESIN-DM 100-10 MG/5ML PO SYRP: 5.0000 mL | ORAL_SOLUTION | Freq: Four times a day (QID) | ORAL | Status: DC | PRN

## 2017-07-15 MED ORDER — POTASSIUM & SODIUM PHOSPHATES 280-160-250 MG PO PACK: 1.0000 | PACK | Freq: Three times a day (TID) | ORAL | Status: DC

## 2017-07-15 MED ORDER — ROSUVASTATIN CALCIUM 10 MG PO TABS
20.0000 mg | ORAL_TABLET | Freq: Every day | ORAL | Status: DC
  Administered 2017-07-15: 20 mg
  Filled 2017-07-15 (×2): qty 2

## 2017-07-15 MED ORDER — ROSUVASTATIN CALCIUM 10 MG PO TABS
20.0000 mg | ORAL_TABLET | Freq: Every day | ORAL | Status: DC
  Administered 2017-07-16 – 2017-08-02 (×18): 20 mg via ORAL
  Filled 2017-07-15 (×18): qty 2

## 2017-07-15 MED ORDER — ACETAMINOPHEN 325 MG PO TABS
325.0000 mg | ORAL_TABLET | ORAL | Status: DC | PRN
  Administered 2017-07-17 – 2017-08-02 (×11): 650 mg via ORAL
  Filled 2017-07-15 (×11): qty 2

## 2017-07-15 MED ORDER — ARTIFICIAL TEARS OPHTHALMIC OINT
TOPICAL_OINTMENT | Freq: Every day | OPHTHALMIC | Status: DC
  Administered 2017-07-16 – 2017-07-18 (×4): via OPHTHALMIC
  Administered 2017-07-19 – 2017-07-20 (×2): 1 via OPHTHALMIC
  Administered 2017-07-21 – 2017-07-30 (×10): via OPHTHALMIC
  Administered 2017-07-31: 1 via OPHTHALMIC
  Administered 2017-08-01 – 2017-08-02 (×2): via OPHTHALMIC
  Filled 2017-07-15 (×3): qty 3.5

## 2017-07-15 MED ORDER — SENNOSIDES-DOCUSATE SODIUM 8.6-50 MG PO TABS
1.0000 | ORAL_TABLET | Freq: Every evening | ORAL | Status: DC | PRN
  Administered 2017-07-16: 1 via ORAL
  Filled 2017-07-15: qty 1

## 2017-07-15 MED ORDER — METOPROLOL TARTRATE 5 MG/5ML IV SOLN
5.0000 mg | INTRAVENOUS | Status: DC | PRN
  Filled 2017-07-15: qty 5

## 2017-07-15 MED ORDER — FLEET ENEMA 7-19 GM/118ML RE ENEM: 1.0000 | ENEMA | Freq: Once | RECTAL | Status: DC | PRN

## 2017-07-15 MED ORDER — PANTOPRAZOLE SODIUM 40 MG PO TBEC
40.0000 mg | DELAYED_RELEASE_TABLET | Freq: Every day | ORAL | Status: DC
  Filled 2017-07-15: qty 1

## 2017-07-15 MED ORDER — ENOXAPARIN SODIUM 40 MG/0.4ML ~~LOC~~ SOLN
40.0000 mg | SUBCUTANEOUS | Status: DC
  Administered 2017-07-16 – 2017-07-18 (×3): 40 mg via SUBCUTANEOUS
  Filled 2017-07-15 (×3): qty 0.4

## 2017-07-15 MED ORDER — SODIUM CHLORIDE 0.9 % IV SOLN
INTRAVENOUS | Status: DC
  Administered 2017-07-15 – 2017-07-20 (×5): via INTRAVENOUS
  Filled 2017-07-15 (×7): qty 1000

## 2017-07-15 MED ORDER — K PHOS MONO-SOD PHOS DI & MONO 155-852-130 MG PO TABS
250.0000 mg | ORAL_TABLET | Freq: Three times a day (TID) | ORAL | Status: DC
  Administered 2017-07-15 (×3): 250 mg via ORAL
  Filled 2017-07-15 (×4): qty 1

## 2017-07-15 NOTE — Progress Notes (Signed)
  Speech Language Pathology Treatment: Dysphagia  Patient Details Name: Arthur [name] MRN: [id number] DOB: Nov 03, 1928 Today's Date: 07/15/2017 Time: 0950-1000 SLP Time Calculation (min) (ACUTE ONLY): 10 min  Assessment / Plan / Recommendation Clinical Impression  Pt alert and cooperative, independently self feeding nectar via cup, utilizing second swallow strategy without cues. SLP guided pt in CTAR/Shaker exercises x20 with moderate verbal tactile cues fading to independence. Pt asks, how many times a day should I do this? I suggested a set of 20 reps 3x a day. Pt able to complete shaker with bed at 15 degrees, pillow removed. Pt in agreement.   HPI HPI: 84 y.o.malewith history of afib on coumadin, LAA thrombus s/p MAZE procedure, CAD s/p CABG, stroke in 05/2011 with left large cerebellar infarct, HTN, HLDadmitted for left hemiplegia and right gaze, left neglect and left facial droop.TPA given. Successful revascularization Rt MCA on 1/29. MRI rightMCA and ACA patchy scattered moderate to largeinfarct; old large LEFT cerebellar infarct, old small LEFT frontal/MCA territory and RIGHT parietoccipital/posterior watershed territory infarcts.      SLP Plan  Continue with current plan of care       Recommendations  Diet recommendations: Dysphagia 1 (puree);Nectar-thick liquid Liquids provided via: Cup Medication Administration: Crushed with puree Supervision: Patient able to self feed;Intermittent supervision to cue for compensatory strategies Compensations: Slow rate;Small sips/bites;Minimize environmental distractions;Multiple dry swallows after each bite/sip Postural Changes and/or Swallow Maneuvers: Seated upright 90 degrees                Oral Care Recommendations: Oral care BID Follow up Recommendations: Inpatient Rehab SLP Visit Diagnosis: Dysphagia, oropharyngeal phase (R13.12) Plan: Continue with current plan of care       [name] Arthur Bissonnette, MA  CCC-SLP [phone]  Arthur [name] 07/15/2017, 10:02 AM

## 2017-07-15 NOTE — Progress Notes (Signed)
Pt admitted to [phone]. Pt has wife at bedside. Pt oriented to unit and all concerns were addressed. Continue plan of care.

## 2017-07-15 NOTE — Progress Notes (Signed)
OT Treatment Note  Pt making good progress. Significant L Lateral lean with "pushing " to L due to apparent visual perceptual and spatial deficits. Pt is very motivated to work with OT and very Patent attorney. Pt needs extensive rehab and will benefit from rehab at [facility] but will need 24/7 Assistance after DC.    07/15/17 1600  OT Visit Information  Last OT Received On 07/15/17  Assistance Needed +2  History of Present Illness 84 y.o.malewith hx of afib, LAA thrombus s/p MAZE procedure, CAD s/p CABG, CVA in 05/2011 with left large cerebellar infarct, HTN, HLDadmitted for left hemiplegia and right gaze, left neglect and left facial droop.TPA given. Work up revealed rightMCA and ACA patchy scattered moderate to largeinfarct s/p IV and IA  Precautions  Precautions Fall  Precaution Comments L neglect  Pain Assessment  Pain Assessment No/denies pain  Cognition  Arousal/Alertness Awake/alert  Behavior During Therapy WFL for tasks assessed/performed  Overall Cognitive Status Impaired/Different from baseline  Area of Impairment Attention;Memory;Safety/judgement;Awareness;Problem solving  Current Attention Level Sustained  Memory Decreased short-term memory  Following Commands Follows one step commands consistently  Safety/Judgement Decreased awareness of safety;Decreased awareness of deficits  Awareness Intellectual  Problem Solving Slow processing;Decreased initiation;Difficulty sequencing;Requires verbal cues;Requires tactile cues  ADL  Overall ADL's  Needs assistance/impaired  Grooming Moderate assistance  Grooming Details (indicate cue type and reason) Ablet o wash face wtih set up; Swabbed mouth with min A  Functional mobility during ADLs Maximal assistance;+2 for physical assistance  Bed Mobility  Overal bed mobility Needs Assistance  Bed Mobility Rolling;Sidelying to Sit;Sit to Sidelying  Rolling Mod assist  Sidelying to sit Max assist  Supine to sit Max assist  General bed  mobility comments initiation of head shoulders pelvis to facilitate rolling; pushing up through RUE. Worked on sidelying to half sitting . Weight bearing through LUE during activity  Balance  Sitting balance-Leahy Scale Poor  Sitting balance - Comments L Lat lean  Postural control Left lateral lean  Standing balance-Leahy Scale Zero  Vision- Assessment  Vision Assessment? Vision impaired- to be further tested in functional context  Additional Comments L visual field cut  Transfers  Overall transfer level Needs assistance  Equipment used 2 person hand held assist  Transfers Sit to/from Stand  Sit to Stand Max assist;+2 physical assistance;From elevated surface  Other Exercises  Other Exercises Facilitation of rolling  Other Exercises worked on midline postural control using targets to maintain midline with mod A at times  Other Exercises Left in L sidelying with L shoulder inER to weight bear through L side and increase attention to L  OT - End of Session  Equipment Utilized During Treatment Gait belt  Activity Tolerance Patient tolerated treatment well  Patient left in bed;with call bell/phone within reach;with bed alarm set;with SCD's reapplied  Nurse Communication Mobility status  OT Assessment/Plan  OT Plan Discharge plan remains appropriate  OT Visit Diagnosis Unsteadiness on feet (R26.81);Other abnormalities of gait and mobility (R26.89);Muscle weakness (generalized) (M62.81);Low vision, both eyes (H54.2);Other symptoms and signs involving cognitive function;Hemiplegia and hemiparesis  Hemiplegia - Right/Left Left  Hemiplegia - dominant/non-dominant Non-Dominant  Hemiplegia - caused by Cerebral infarction  OT Frequency (ACUTE ONLY) Min 3X/week  Recommendations for Other Services Rehab consult;PT consult;Speech consult  Follow Up Recommendations CIR;Supervision/Assistance - 24 hour  OT Equipment Other (comment) (TBA)  AM-PAC OT "6 Clicks" Daily Activity Outcome Measure  Help  from another person eating meals? 2  Help from another person taking care of personal  grooming? 2  Help from another person toileting, which includes using toliet, bedpan, or urinal? 2  Help from another person bathing (including washing, rinsing, drying)? 2  Help from another person to put on and taking off regular upper body clothing? 2  Help from another person to put on and taking off regular lower body clothing? 2  6 Click Score 12  ADL G Code Conversion CL  OT Goal Progression  Progress towards OT goals Progressing toward goals  Acute Rehab OT Goals  Patient Stated Goal to get better  OT Goal Formulation With patient/family  Time For Goal Achievement 07/26/17  Potential to Achieve Goals Good  ADL Goals  Pt Will Perform Grooming with min assist;sitting  Pt Will Perform Upper Body Dressing with min assist;sitting  Pt Will Perform Lower Body Dressing with mod assist;sit to/from stand  Pt Will Transfer to Toilet with mod assist;bedside commode;stand pivot transfer  Additional ADL Goal #1 Pt will attend to ADL items in left visual feild 75% of time with Min VCs  Additional ADL Goal #2 Pt will maintain sitting at EOB for 5-10 minutes with Min A in preparation for ADLs  OT Time Calculation  OT Start Time (ACUTE ONLY) 0850  OT Stop Time (ACUTE ONLY) 0929  OT Time Calculation (min) 39 min  OT General Charges  $OT Visit 1 Visit  OT Treatments  $Self Care/Home Management  8-22 mins  $Neuromuscular Re-education 23-37 mins  [facility], OT/L  [phone] 07/15/2017

## 2017-07-15 NOTE — Discharge Summary (Addendum)
Stroke Discharge Summary  Patient ID: Arthur [name]   MRN: [id number]      DOB: 06/23/1928  Date of Admission: 07/11/2017 Date of Discharge: 07/15/2017  Attending Physician:  Rosalin Hawking, MD, Stroke MD Consultant(s):   pulmonary/intensive care and  Interventional radiology Patient's PCP:  Hulan Fess, MD  Discharge Diagnoses: right MCA and ACA patchy scattered moderate to large infarct s/p IV and IA tPA, embolic secondary to afib on coumadin with subtherapeutic INR  Active Problems:   Acute left hemiparesis (HCC)   afib on coumadin   History of stroke   HTN   HLD   CAD s/p CABG   LAA thrombus s/p MAZE procedure   Left eyelid ulcer   Past Medical History:  Diagnosis Date  . Arthritis    "some in my joints" (01/24/2015)  . CKD (chronic kidney disease), stage II   . Coronary artery disease    a. s/p CABG in 2005 with MV repair and MAZE procedure. b. s/p DES to prox RCA 01/2015.   Marland Kitchen CVA (cerebrovascular accident) [facility]) ~ 2014   RIGHT BRAIN; denies residual on 01/24/2015  . Esophagitis    Distal esophagitis  . GERD (gastroesophageal reflux disease)   . Heart murmur   . History of hiatal hernia   . History of recurrent TIAs   . Hyperlipidemia   . Hypertension   . Hypertensive vascular disease   . MVP (mitral valve prolapse)   . Nephrolithiasis   . Odynophagia   . PAF (paroxysmal atrial fibrillation) ([place])   . Transudative pleural effusion    Past Surgical History:  Procedure Laterality Date  . APPENDECTOMY  1960's  . CARDIAC CATHETERIZATION  03/19/2004  . CARDIAC CATHETERIZATION N/A 01/24/2015   Procedure: Left Heart Cath and Coronary Angiography;  Surgeon: Peter M Martinique, MD;  Location: [place] [facility];  Service: Cardiovascular;  Laterality: N/A;  . CARDIAC CATHETERIZATION N/A 01/24/2015   Procedure: Coronary Stent Intervention;  Surgeon: Peter M Martinique, MD;  Location: [place] [facility];  Service: Cardiovascular;  Laterality: N/A;  . CORONARY ANGIOPLASTY WITH  STENT PLACEMENT  01/24/2015   "1 stent"  . CORONARY ARTERY BYPASS GRAFT  04/2004   LIMA GRAFT TO THE DISTAL LAD, SAPHENOUS VEIN GRAFT TO THE FIRST DIADGONAL BRANCH, SAPHENOUS VEIN GRAFT TO THE THIRD MARIGINAL BRANCH, AND SAPHENOUS VEIN GRAFT TO THE PDA  . IR ANGIO VERTEBRAL SEL SUBCLAVIAN INNOMINATE UNI R MOD SED  07/11/2017  . IR PERCUTANEOUS ART THROMBECTOMY/INFUSION INTRACRANIAL INC DIAG ANGIO  07/11/2017  . MAZE  04/2004  . MITRAL VALVE REPAIR  04/2004  . RADIOLOGY WITH ANESTHESIA N/A 07/11/2017   Procedure: RADIOLOGY WITH ANESTHESIA;  Surgeon: Luanne Bras, MD;  Location: [place];  Service: Radiology;  Laterality: N/A;  . TEE WITHOUT CARDIOVERSION  05/19/2011   Procedure: TRANSESOPHAGEAL ECHOCARDIOGRAM (TEE);  Surgeon: Peter Martinique, MD;  Location: [place];  Service: Cardiovascular;  Laterality: N/A;  . TONSILLECTOMY AND ADENOIDECTOMY  1944    Medications to be continued on Rehab .  stroke: mapping our early stages of recovery book   Does not apply Once  . amantadine  100 mg Oral BID  . aspirin EC  325 mg Oral Daily  . enoxaparin (LOVENOX) injection  40 mg Subcutaneous Q24H  . pantoprazole  40 mg Oral Daily  . phosphorus  250 mg Oral TID WC & HS  . rosuvastatin  20 mg Per Tube q1800    LABORATORY STUDIES CBC  Component Value Date/Time   WBC 10.9 (H) 07/15/2017 0351   RBC 4.22 07/15/2017 0351   HGB 12.6 (L) 07/15/2017 0351   HCT 39.3 07/15/2017 0351   PLT 187 07/15/2017 0351   MCV 93.1 07/15/2017 0351   MCH 29.9 07/15/2017 0351   MCHC 32.1 07/15/2017 0351   RDW 13.8 07/15/2017 0351   LYMPHSABS 0.9 07/12/2017 0359   MONOABS 0.4 07/12/2017 0359   EOSABS 0.0 07/12/2017 0359   BASOSABS 0.0 07/12/2017 0359   CMP    Component Value Date/Time   NA 142 07/15/2017 0351   K 4.4 07/15/2017 0351   CL 112 (H) 07/15/2017 0351   CO2 20 (L) 07/15/2017 0351   GLUCOSE 113 (H) 07/15/2017 0351   BUN 19 07/15/2017 0351   CREATININE 1.14 07/15/2017 0351   CREATININE 1.24 (H)  11/17/2016 0958   CALCIUM 8.9 07/15/2017 0351   PROT 6.6 07/11/2017 1820   ALBUMIN 4.3 07/11/2017 1820   AST 25 07/11/2017 1820   ALT 14 (L) 07/11/2017 1820   ALKPHOS 57 07/11/2017 1820   BILITOT 0.9 07/11/2017 1820   GFRNONAA 56 (L) 07/15/2017 0351   GFRAA >60 07/15/2017 0351   COAGS Lab Results  Component Value Date   INR 1.48 07/11/2017   INR 1.8 06/23/2017   INR 2.5 05/13/2017   Lipid Panel    Component Value Date/Time   CHOL 122 07/12/2017 0359   TRIG 92 07/12/2017 0359   TRIG 93 07/12/2017 0359   HDL 38 (L) 07/12/2017 0359   CHOLHDL 3.2 07/12/2017 0359   VLDL 18 07/12/2017 0359   LDLCALC 66 07/12/2017 0359   HgbA1C  Lab Results  Component Value Date   HGBA1C 5.6 07/13/2017   Urinalysis    Component Value Date/Time   COLORURINE YELLOW 07/14/2017 1618   APPEARANCEUR CLEAR 07/14/2017 1618   LABSPEC 1.013 07/14/2017 1618   PHURINE 6.0 07/14/2017 1618   GLUCOSEU 50 (A) 07/14/2017 1618   HGBUR MODERATE (A) 07/14/2017 1618   BILIRUBINUR NEGATIVE 07/14/2017 1618   KETONESUR 20 (A) 07/14/2017 1618   PROTEINUR 30 (A) 07/14/2017 1618   UROBILINOGEN 1.0 05/13/2011 2200   NITRITE NEGATIVE 07/14/2017 1618   LEUKOCYTESUR NEGATIVE 07/14/2017 1618   Urine Drug Screen     Component Value Date/Time   LABOPIA NONE DETECTED 07/12/2017 0420   COCAINSCRNUR NONE DETECTED 07/12/2017 0420   LABBENZ NONE DETECTED 07/12/2017 0420   AMPHETMU NONE DETECTED 07/12/2017 0420   THCU NONE DETECTED 07/12/2017 0420   LABBARB NONE DETECTED 07/12/2017 0420    Alcohol Level    Component Value Date/Time   ETH <10 07/11/2017 1820     SIGNIFICANT DIAGNOSTIC STUDIES  Ct Angio Head W Or Wo Contrast  Result Date: 07/11/2017 CLINICAL DATA:  84 y/o  M; code stroke with left-sided deficits. EXAM: CT ANGIOGRAPHY HEAD AND NECK TECHNIQUE: Multidetector CT imaging of the head and neck was performed using the standard protocol during bolus administration of intravenous contrast. Multiplanar  CT image reconstructions and MIPs were obtained to evaluate the vascular anatomy. Carotid stenosis measurements (when applicable) are obtained utilizing NASCET criteria, using the distal internal carotid diameter as the denominator. CONTRAST:  75mL ISOVUE-370 IOPAMIDOL (ISOVUE-370) INJECTION 76% COMPARISON:  07/11/2016 CT head.  05/15/2011 MRA head. FINDINGS: CTA NECK FINDINGS Aortic arch: Standard branching. Imaged portion shows no evidence of aneurysm or dissection. No significant stenosis of the major arch vessel origins. Moderate calcific atherosclerosis. Status post LIMA and saphenous CABG grafts. Right carotid system: No evidence of dissection,  stenosis (50% or greater) or occlusion. Dense calcified plaque of the carotid bifurcation with mild less than 50% distal common carotid and internal carotid artery stenosis. Left carotid system: No evidence of dissection, stenosis (50% or greater) or occlusion. Dense calcified plaque of carotid bifurcation with mild less than 50% proximal ICA stenosis. Calcified plaque of left mid common carotid artery with minimal less than 30% stenosis. Vertebral arteries: Codominant. No evidence of dissection, stenosis (50% or greater) or occlusion. Right V1 calcified plaque with mild less than 50% stenosis. Skeleton: Severe cervical spondylosis with extensive discogenic degenerative changes greatest at the C5-C7 levels and severe facet hypertrophy. Reversal of cervical curvature with apex at C5. Multiple levels of canal stenosis greatest at the C5-6 level where it is moderate to severe. Uncovertebral and facet hypertrophy narrows the neural foramen greatest at the bilateral C5-6 and C6-7 levels. Other neck: Negative. Upper chest: Negative. Review of the MIP images confirms the above findings CTA HEAD FINDINGS Anterior circulation: Right M2 superior division proximal occlusion (series 7, image 90). Poor collateralization in the right M2 superior division distribution. Left distal M1  short segment of moderate stenosis. Bilateral carotid siphon calcific plaque with moderate bilateral paraclinoid stenosis. Posterior circulation: New focal calcification within the right P4 parietooccipital branch origin (series 8, image 116) with normal downstream opacification of the artery, possibly atherosclerotic or nonocclusive calcified plaque thrombus. Bilateral P1 irregularity with mild stenosis. Venous sinuses: As permitted by contrast timing, patent. Anatomic variants: Anterior communicating artery. No posterior communicating artery identified, likely hypoplastic or absent. Delayed phase: No abnormal intracranial enhancement. Review of the MIP images confirms the above findings IMPRESSION: CTA neck: 1. No dissection, aneurysm, or high-grade stenosis by NASCET criteria. 2. Calcified plaque of aorta and carotid bifurcations with mild less than 50% proximal ICA stenosis bilaterally. 3. Severe cervical spondylosis greatest at C5 and C6 levels. At least moderate C5-6 canal stenosis. CTA head: 1. Right M2 superior division proximal occlusion with poor downstream collateralization. 2. New calcification within right P 4 parieto-occipital origin, possibly atherosclerotic or nonocclusive calcified plaque thrombus. 3. Intracranial atherosclerosis with multiple areas of mild-to-moderate stenosis. No high-grade stenosis, aneurysm, or additional large vessel occlusion. These results were communicated to Dr. Lorraine Lax at [place] 1/28/2019by text page via the [facility] messaging system. Electronically Signed   By: Kristine Garbe M.D.   On: 07/11/2017 19:05   Ct Head Wo Contrast  Result Date: 07/12/2017 CLINICAL DATA:  24 hour follow-up status post tPA. EXAM: CT HEAD WITHOUT CONTRAST TECHNIQUE: Contiguous axial images were obtained from the base of the skull through the vertex without intravenous contrast. COMPARISON:  Previous MRI from earlier the same day. FINDINGS: Brain: Patchy multifocal ischemic infarcts  involving the right MCA and ACA territories as well as the watershed distribution again see, stable from previous MRI. No evidence for hemorrhagic transformation or other complication status post tPA. Otherwise stable appearance of the brain. Small remote left frontal infarct again noted. Remote left cerebellar infarct. Stable atrophy with chronic small vessel ischemic disease. No other acute intracranial hemorrhage or large vessel territory infarct. No mass lesion, midline shift or mass effect. No hydrocephalus. No extra-axial fluid collection. Vascular: No hyperdense vessel. Scattered vascular calcifications noted within the carotid siphons. Skull: Scalp soft tissues and calvarium within normal limits. Sinuses/Orbits: Globes and orbital soft tissues within normal limits. Air-fluid levels noted within the sphenoid and maxillary sinuses. Scattered mucosal thickening within the ethmoidal sinuses. Mastoids are clear. Other: None. IMPRESSION: 1. Continued interval evolution right MCA and  ACA territory infarcts, stable in distribution as compared to recent MRI. No evidence for hemorrhagic transformation or other complication identified. 2. Otherwise stable appearance of the brain. No other acute intracranial abnormality. 3. Acute maxillary and sphenoid sinusitis. Electronically Signed   By: Jeannine Boga M.D.   On: 07/12/2017 19:27   Ct Head Wo Contrast  Result Date: 07/11/2017 CLINICAL DATA:  84 y/o M; post intra-arterial intervention for stroke. EXAM: CT HEAD WITHOUT CONTRAST TECHNIQUE: Contiguous axial images were obtained from the base of the skull through the vertex without intravenous contrast. COMPARISON:  07/11/2017 CT head and CT angiogram head. FINDINGS: Brain: Interval loss of gray-white differentiation in the right perisylvian frontal and parietal lobes (series 3, image 26 and series 6, image 21) likely representing developing acute infarction. No hemorrhage. Stable small chronic cortical  infarcts in the frontal and right parietal lobes, small chronic infarct in left inferior cerebellar hemisphere, mild chronic microvascular ischemic changes, and parenchymal volume loss of the brain. Vascular: Contrast is present within the vascular structures. Skull: Normal. Negative for fracture or focal lesion. Sinuses/Orbits: Persistent partial opacification of left anterior ethmoid and frontal sinuses. Fluid level in the right maxillary sinus. Normal aeration of mastoid air cells. Other: None. IMPRESSION: 1. Interval loss of gray-white differentiation in the right perisylvian frontal and parietal lobes likely representing a developing acute infarction. No hemorrhage. 2. Stable background of chronic microvascular ischemic changes and parenchymal volume loss of the brain. Electronically Signed   By: Kristine Garbe M.D.   On: 07/11/2017 21:50   Ct Angio Neck W And/or Wo Contrast  Result Date: 07/11/2017 CLINICAL DATA:  84 y/o  M; code stroke with left-sided deficits. EXAM: CT ANGIOGRAPHY HEAD AND NECK TECHNIQUE: Multidetector CT imaging of the head and neck was performed using the standard protocol during bolus administration of intravenous contrast. Multiplanar CT image reconstructions and MIPs were obtained to evaluate the vascular anatomy. Carotid stenosis measurements (when applicable) are obtained utilizing NASCET criteria, using the distal internal carotid diameter as the denominator. CONTRAST:  66mL ISOVUE-370 IOPAMIDOL (ISOVUE-370) INJECTION 76% COMPARISON:  07/11/2016 CT head.  05/15/2011 MRA head. FINDINGS: CTA NECK FINDINGS Aortic arch: Standard branching. Imaged portion shows no evidence of aneurysm or dissection. No significant stenosis of the major arch vessel origins. Moderate calcific atherosclerosis. Status post LIMA and saphenous CABG grafts. Right carotid system: No evidence of dissection, stenosis (50% or greater) or occlusion. Dense calcified plaque of the carotid bifurcation  with mild less than 50% distal common carotid and internal carotid artery stenosis. Left carotid system: No evidence of dissection, stenosis (50% or greater) or occlusion. Dense calcified plaque of carotid bifurcation with mild less than 50% proximal ICA stenosis. Calcified plaque of left mid common carotid artery with minimal less than 30% stenosis. Vertebral arteries: Codominant. No evidence of dissection, stenosis (50% or greater) or occlusion. Right V1 calcified plaque with mild less than 50% stenosis. Skeleton: Severe cervical spondylosis with extensive discogenic degenerative changes greatest at the C5-C7 levels and severe facet hypertrophy. Reversal of cervical curvature with apex at C5. Multiple levels of canal stenosis greatest at the C5-6 level where it is moderate to severe. Uncovertebral and facet hypertrophy narrows the neural foramen greatest at the bilateral C5-6 and C6-7 levels. Other neck: Negative. Upper chest: Negative. Review of the MIP images confirms the above findings CTA HEAD FINDINGS Anterior circulation: Right M2 superior division proximal occlusion (series 7, image 90). Poor collateralization in the right M2 superior division distribution. Left distal M1 short segment  of moderate stenosis. Bilateral carotid siphon calcific plaque with moderate bilateral paraclinoid stenosis. Posterior circulation: New focal calcification within the right P4 parietooccipital branch origin (series 8, image 116) with normal downstream opacification of the artery, possibly atherosclerotic or nonocclusive calcified plaque thrombus. Bilateral P1 irregularity with mild stenosis. Venous sinuses: As permitted by contrast timing, patent. Anatomic variants: Anterior communicating artery. No posterior communicating artery identified, likely hypoplastic or absent. Delayed phase: No abnormal intracranial enhancement. Review of the MIP images confirms the above findings IMPRESSION: CTA neck: 1. No dissection, aneurysm,  or high-grade stenosis by NASCET criteria. 2. Calcified plaque of aorta and carotid bifurcations with mild less than 50% proximal ICA stenosis bilaterally. 3. Severe cervical spondylosis greatest at C5 and C6 levels. At least moderate C5-6 canal stenosis. CTA head: 1. Right M2 superior division proximal occlusion with poor downstream collateralization. 2. New calcification within right P 4 parieto-occipital origin, possibly atherosclerotic or nonocclusive calcified plaque thrombus. 3. Intracranial atherosclerosis with multiple areas of mild-to-moderate stenosis. No high-grade stenosis, aneurysm, or additional large vessel occlusion. These results were communicated to Dr. Lorraine Lax at [place] 1/28/2019by text page via the [facility] messaging system. Electronically Signed   By: Kristine Garbe M.D.   On: 07/11/2017 19:05   Mr Jodene Nam Head Wo Contrast  Result Date: 07/12/2017 CLINICAL DATA:  LEFT-sided weakness, RIGHT gaze deficit. Status post tPA and complete revascularization of RIGHT ACA occlusion. EXAM: MRI HEAD WITHOUT CONTRAST MRA HEAD WITHOUT CONTRAST TECHNIQUE: Multiplanar, multiecho pulse sequences of the brain and surrounding structures were obtained without intravenous contrast. Angiographic images of the head were obtained using MRA technique without contrast. COMPARISON:  CT HEAD and CTA HEAD and neck July 11, 2016. MRI of the head May 15, 2011 FINDINGS: MRI HEAD FINDINGS INTRACRANIAL CONTENTS: Confluent RIGHT frontoparietal reduced diffusion including medial cortex with low ADC values. Discontinuous RIGHT frontal and RIGHT basal ganglia reduced diffusion and low ADC values. Subcentimeter reduced diffusion and low ADC values RIGHT posterior temporal lobe. Faint susceptibility artifact RIGHT temporoparietal lobes compatible with petechial hemorrhage. Scattered chronic micro hemorrhages. LEFT cerebellar encephalomalacia. Small area RIGHT temporal occipital and LEFT frontal encephalomalacia. Old  small RIGHT cerebellar, RIGHT pontine, RIGHT thalamus and RIGHT basal ganglia to corona radiata infarct. Prominent basal ganglia perivascular spaces associated with chronic small vessel ischemic disease. A few additional subcentimeter supratentorial white matter FLAIR T2 hyperintensities compatible with mild chronic small vessel ischemic disease, less than expected for age. No advanced parenchymal brain volume loss for age. No midline shift, mass effect or masses. No abnormal extra-axial fluid collections. VASCULAR: Normal major intracranial vascular flow voids present at skull base. SKULL AND UPPER CERVICAL SPINE: No abnormal sellar expansion. No suspicious calvarial bone marrow signal. 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 Craniocervical junction maintained. SINUSES/ORBITS: Pan paranasal sinusitis with large RIGHT maxillary sinus air-fluid level. Trace RIGHT mastoid effusion. Included ocular globes and orbital contents are non-suspicious. OTHER: None. MRA HEAD FINDINGS ANTERIOR CIRCULATION: Normal flow related enhancement of the included cervical, petrous, cavernous and supraclinoid internal carotid arteries. Moderate stenosis bilateral para clinoid internal carotid arteries. Patent anterior communicating artery. Patent anterior and middle cerebral arteries, bilateral anterior cerebral arteries distal segments on included on MRA. No large vessel occlusion, flow limiting stenosis, aneurysm. POSTERIOR CIRCULATION: Codominant vertebral arteries. Patent vertebrobasilar junction and basilar artery. Main branch vessels not well demonstrated. Severe stenosis RIGHT P1 segment, moderate LEFT P2 stenosis. RIGHT P3 occlusion with thready reconstitution. No  aneurysm. ANATOMIC VARIANTS: None. Source images and MIP images  were reviewed. IMPRESSION: MRI head: 1. Acute moderate RIGHT MCA and small RIGHT distal ACA infarcts. Small RIGHT posterior watershed territory infarct. Minimal petechial  hemorrhage without hemorrhagic conversion. 2. 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 Multiple small vessel supra-and infratentorial old infarcts. MRA head: 1. Successful revascularization of RIGHT middle cerebral artery occlusion. Distal anterior cerebral artery's not included on typical MRA head. 2. Focally occluded RIGHT P3 segment (possibly overestimated by calcification and resultant artifact) with immediate reconstitution. Severe stenosis RIGHT P1 segment. Electronically Signed   By: Elon Alas M.D.   On: 07/12/2017 02:04   Mr Brain Wo Contrast  Result Date: 07/12/2017 CLINICAL DATA:  LEFT-sided weakness, RIGHT gaze deficit. Status post tPA and complete revascularization of RIGHT ACA occlusion. EXAM: MRI HEAD WITHOUT CONTRAST MRA HEAD WITHOUT CONTRAST TECHNIQUE: Multiplanar, multiecho pulse sequences of the brain and surrounding structures were obtained without intravenous contrast. Angiographic images of the head were obtained using MRA technique without contrast. COMPARISON:  CT HEAD and CTA HEAD and neck July 11, 2016. MRI of the head May 15, 2011 FINDINGS: MRI HEAD FINDINGS INTRACRANIAL CONTENTS: Confluent RIGHT frontoparietal reduced diffusion including medial cortex with low ADC values. Discontinuous RIGHT frontal and RIGHT basal ganglia reduced diffusion and low ADC values. Subcentimeter reduced diffusion and low ADC values RIGHT posterior temporal lobe. Faint susceptibility artifact RIGHT temporoparietal lobes compatible with petechial hemorrhage. Scattered chronic micro hemorrhages. LEFT cerebellar encephalomalacia. Small area RIGHT temporal occipital and LEFT frontal encephalomalacia. Old small RIGHT cerebellar, RIGHT pontine, RIGHT thalamus and RIGHT basal ganglia to corona radiata infarct. Prominent basal ganglia perivascular spaces associated with chronic small vessel ischemic  disease. A few additional subcentimeter supratentorial white matter FLAIR T2 hyperintensities compatible with mild chronic small vessel ischemic disease, less than expected for age. No advanced parenchymal brain volume loss for age. No midline shift, mass effect or masses. No abnormal extra-axial fluid collections. VASCULAR: Normal major intracranial vascular flow voids present at skull base. SKULL AND UPPER CERVICAL SPINE: No abnormal sellar expansion. No suspicious calvarial bone marrow signal. 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 Craniocervical junction maintained. SINUSES/ORBITS: Pan paranasal sinusitis with large RIGHT maxillary sinus air-fluid level. Trace RIGHT mastoid effusion. Included ocular globes and orbital contents are non-suspicious. OTHER: None. MRA HEAD FINDINGS ANTERIOR CIRCULATION: Normal flow related enhancement of the included cervical, petrous, cavernous and supraclinoid internal carotid arteries. Moderate stenosis bilateral para clinoid internal carotid arteries. Patent anterior communicating artery. Patent anterior and middle cerebral arteries, bilateral anterior cerebral arteries distal segments on included on MRA. No large vessel occlusion, flow limiting stenosis, aneurysm. POSTERIOR CIRCULATION: Codominant vertebral arteries. Patent vertebrobasilar junction and basilar artery. Main branch vessels not well demonstrated. Severe stenosis RIGHT P1 segment, moderate LEFT P2 stenosis. RIGHT P3 occlusion with thready reconstitution. No  aneurysm. ANATOMIC VARIANTS: None. Source images and MIP images were reviewed. IMPRESSION: MRI head: 1. Acute moderate RIGHT MCA and small RIGHT distal ACA infarcts. Small RIGHT posterior watershed territory infarct. Minimal petechial hemorrhage without hemorrhagic conversion. 2. 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 Multiple small  vessel supra-and infratentorial old infarcts. MRA head: 1. Successful revascularization of RIGHT middle cerebral artery occlusion. Distal anterior cerebral artery's not included on typical MRA head. 2. Focally occluded RIGHT P3 segment (possibly overestimated by calcification and resultant artifact) with immediate reconstitution. Severe stenosis RIGHT P1 segment. Electronically Signed   By: Thana Farr.D.  On: 07/12/2017 02:04   Portable Chest Xray  Result Date: 07/12/2017 CLINICAL DATA:  84 year old male status post right MCA M2 vessel occlusion treated with tPA and revascularization. Intubated. EXAM: PORTABLE CHEST 1 VIEW COMPARISON:  07/11/2017 and earlier. FINDINGS: Portable AP semi upright view at 0617 hours. Stable endotracheal tube tip just below the clavicles. Stable lung volumes. Mediastinal contours remain within normal limits. Calcified aortic atherosclerosis. Prior CABG. Streaky right mid and lower lung opacity with blunting of the right costophrenic angle appears chronic and not significantly changed compared to 2016. No pneumothorax, pulmonary edema or acute pulmonary opacity. IMPRESSION: 1. Stable endotracheal tube. 2. Chronic opacity in the right lower lung. No acute cardiopulmonary abnormality. Electronically Signed   By: Genevie Ann M.D.   On: 07/12/2017 08:40   Portable Chest X-ray  Result Date: 07/11/2017 CLINICAL DATA:  Respiratory failure EXAM: PORTABLE CHEST 1 VIEW COMPARISON:  Chest x-ray dated 01/13/2015. FINDINGS: Heart size and mediastinal contours are stable in size and configuration. Aortic atherosclerosis. Median sternotomy wires appear intact. Endotracheal tube is adequately positioned with tip approximately 5 cm above the carina. Chronic small pleural effusion and/or pleural thickening at the right lung base. No new lung findings. No pneumothorax seen. IMPRESSION: 1. No active disease.  No evidence of pneumonia or pulmonary edema. 2. Chronic small right pleural  effusion and/or pleural thickening, stable. 3. Aortic atherosclerosis. Electronically Signed   By: Franki Cabot M.D.   On: 07/11/2017 22:27   Ct Head Code Stroke Wo Contrast  Result Date: 07/11/2017 CLINICAL DATA:  Code stroke. 84 y/o M; left-sided deficits and right-sided gaze. EXAM: CT HEAD WITHOUT CONTRAST TECHNIQUE: Contiguous axial images were obtained from the base of the skull through the vertex without intravenous contrast. COMPARISON:  05/13/2011 CT head. FINDINGS: Brain: Stable small chronic cortical infarcts in left frontal and right parietal lobes. Stable small chronic lacunar infarct in the right caudate body and right thalamus. Chronic infarction in the left inferior cerebellar hemisphere. Background of mild chronic microvascular ischemic changes and parenchymal volume loss of the brain. Vascular: Calcific atherosclerosis of carotid siphons. Hyperdensity within right distal M1 (series 6, image 21). Skull: Normal. Negative for fracture or focal lesion. Sinuses/Orbits: Left anterior ethmoid and frontal sinus partial opacification in chronic inflammatory changes. Right intra-ocular lens replacement. Other: None. ASPECTS [facility] Stroke Program Early CT Score) - Ganglionic level infarction (caudate, lentiform nuclei, internal capsule, insula, M1-M3 cortex): 7 - Supraganglionic infarction (M4-M6 cortex): 3 Total score (0-10 with 10 being normal): 10 IMPRESSION: 1. No acute intracranial abnormality identified. 2. ASPECTS is 10 3. Dense distal right M1, suspected thrombus. 4. Stable chronic microvascular ischemic changes, parenchymal volume loss, and small cortical infarctions. These results were called by telephone at the time of interpretation on 07/11/2017 at 6:43 pm to Dr. Lorraine Lax, who verbally acknowledged these results. Electronically Signed   By: Kristine Garbe M.D.   On: 07/11/2017 18:43   TTE  - Left ventricle: The cavity size was normal. Systolic function was normal. The  estimated ejection fraction was in the range of 55% to 60%. Wall motion was normal; there were no regional wall motion abnormalities. Features are consistent with a pseudonormal left ventricular filling pattern, with concomitant abnormal relaxation and increased filling pressure (grade 2 diastolic dysfunction). - Aortic valve: Transvalvular velocity was within the normal range. There was no stenosis. There was no regurgitation. - Aorta: Ascending aortic diameter: 38 mm (S). - Ascending aorta: The ascending aorta was mildly dilated. - Mitral valve: Prior procedures included  surgical repair. Transvalvular velocity was within the normal range. There was no evidence for stenosis. There was mild regurgitation. Valve area by pressure half-time: 2.29 cm^2. - Left atrium: The atrium was moderately dilated. - Right ventricle: The cavity size was normal. Wall thickness was normal. Systolic function was normal. - Atrial septum: No defect or patent foramen ovale was identified. - Tricuspid valve: There was mild regurgitation. - Pulmonary arteries: Systolic pressure was severely increased. PA peak pressure: 56 mm Hg (S).     HISTORY OF PRESENT ILLNESS Arthur [name] is an 84 y.o. male with PMH of CVA, left atrial appendage thrombus, CAD s/p CABG, Afib s/p Maze, HLD, HTN  On Coumadin presents as stroke alert for left side weakness and right gaze deviation.   Per wife, patient drove her to [facility] around 5pm. Patient was waiting in the car and when she returned he had left facial droop, left side weakness and not talking right. She called 911 and EMS brought pt to [facility] ER. BP on arrival was 172/85 and CBG was 92.  Patient was neglecting left side, hemiplegic and had forced right gaze deviation. CT head showed no hemorrhage. Pt received tpA was INR returned as 1.48. CTA revealed m2 RMCA cuttoff and patient taken to IR.  Date last known well: 07/12/27 Time last known well: 5.15  pm tPA Given: yes NIHSS: 87  Baseline MRS [place] Arthur [name] is a 84 y.o. 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 TPA given    Stroke:  right MCA and ACA patchy scattered moderate to large infarct s/p IV and IA tPA, embolic secondary to afib on coumadin with subtherapeutic INR  Resultant left hemiplegia, right gaze preference  CTA head and neck - right M2 cut off, b/l ICA proximal 50% stenosis  DSA right M2 recanalized after IV tPA, right distal ACA recanalized after IA tPA  MRI  Right MCA and ACA acute infarcts, old left large cerebellar infarct  MRA  Right MCA recanalized after IR  2D Echo EF 55-60%  LDL 66  HgbA1c 5.6  lovenox for VTE prophylaxis   warfarin daily prior to admission, now on ASA 325 mg.   Ongoing aggressive stroke risk factor management  Therapy recommendations:  CIR  Disposition:  CIR  Afib on coumadin  INR 1.48 on admission  Follows with Dr. [place] cardiology  Hold off [facility] for now due to moderate to large right MCA and ACA infarcts  Rate under control  Please start eliquis in 3 days for stroke prevention. ASA and lovenox can be stopped once eliquis started.    Fever   Tmax 100.6  Now afebrile  UA negative   Mild leukocytosis WBC 10.9  CXR - Atelectasis vs Bronchopneumonia at bases  Continue levaquin for total 5 days  Lethargy   Put on amantidine 100mg  bid   Improved  Recommend to continue for 7 days.   History of stroke  05/2011 left large cerebellar infarct  Concerning for cardiac source  Put on DAPT at that time  recommended outpt TEE and long term heart monitoring at that time  Hypertension  Stable  Long term BP goal normotensive  Hyperlipidemia  Home meds:  crestor   LDL 66, goal <  70  Resumed crestor 20  Continue statin  at discharge  Other Stroke Risk Factors  Advanced age  Coronary artery disease s/p CABG  LAA thrombus S/p MAZE  Other Active Problems  Hypokalemia - Resolved. replacement in progress, repeat labs in AM  Hypophosphoremia - replacement in progress, repeat labs in AM   - CXR- no acute findings  Left eyelid ulcer - eye drops and eye care in progress    DISCHARGE EXAM Blood pressure (!) 142/65, pulse 79, temperature 98.6 F (37 C), temperature source Oral, resp. rate 18, height 5\' 8"  (1.727 m), weight 176 lb 12.9 oz (80.2 kg), SpO2 99 %.  General - Well nourished, well developed, not in acute distress.  Ophthalmologic - fundi not visualized due to noncooperation.  Cardiovascular - irregularly irregular heart rate and rhythm.  Neuro - awake alert, sitting in chair. Able to follow simple commands, moderate dysarthria, paucity of speech, able to name 3/4 and repeat simple sentences. Left eyelid mild ptosis with bloody secretions. PERRL, right gaze preference, barely cross midline to left. Blinking to visual threat on the right but not on the left. Facial symmetry difficult to exam due to ET tube. RUE and RLE 5/5, spontaneous purposeful movement. LUE and LLE hemiplegic. On pain stimulation, mild withdraw on the LLE. DTR 1+ and no babinski. Sensation, coordination and gait not tested.   Discharge Diet   DIET - DYS 1 Room service appropriate? Yes; Fluid consistency: Nectar Thick liquids  DISCHARGE PLAN  Disposition:  Transfer to [facility] for ongoing PT, OT and ST  aspirin 325 mg daily now but will change to eliquis in 3 days for secondary stroke prevention.  Recommend ongoing risk factor control by Primary Care Physician at time of discharge from inpatient rehabilitation.  Follow-up Little, Lennette Bihari, MD in 2 weeks following discharge from rehab.  Follow-up with Cecille Rubin NP, Stroke Clinic in 6 weeks, office to  schedule an appointment.   40 minutes were spent preparing discharge.  Rosalin Hawking, MD PhD Stroke Neurology 07/15/2017 3:46 PM

## 2017-07-15 NOTE — Progress Notes (Signed)
I have insurance approval to admit pt to inpt rehab today. I contacted Stanton Kidney, NP and she will make the arrangements to admit today. RN CM and SW are aware. I met with pt and wife at bedside and they are in agreement to admit., [phone]

## 2017-07-15 NOTE — Care Management Important Message (Signed)
Important Message  Patient Details  Name: Arthur [name] MRN: [id number] Date of Birth: 1928-08-06   Medicare Important Message Given:  Yes    Lillah Standre P [place] 07/15/2017, 2:36 PM

## 2017-07-15 NOTE — Progress Notes (Signed)
Cristina Gong, RN  Rehab Admission Coordinator  Physical Medicine and Rehabilitation  PMR Pre-admission  Signed  Date of Service:  07/15/2017 1:02 PM       Related encounter: ED to Hosp-Admission (Current) from 07/11/2017 in [place] 3W [facility]      Signed           [] Hide copied text  [] Hover for details   PMR Admission Coordinator Pre-Admission Assessment  Patient: Arthur [name] is an 84 y.o., male MRN: [id number] DOB: 1928-07-31 Height: 5\' 8"  (172.7 cm) Weight: 80.2 kg (176 lb 12.9 oz)                                                                                                                                                  Insurance Information HMO: yes    PPO:      PCP:      IPA:      80/20:      OTHER: medicare advantage plan PRIMARY: Blue Medicare      Policy#: [id number]      Subscriber: pt CM Name: Arthur [name]      Phone#: [phone]     Fax#: [phone] Pre-Cert#: [id number]   Approved for 7 days   Employer: retired Benefits:  Phone #: [phone]     Name:  Eff. Date: 06/14/2017     Deduct: none      Out of Pocket Max: $5500      Life Max: none CIR: $310 co pay per day days 1 to 6      SNF: no co pay days 1 to 20; $172 co pay per day days 21-60; no co pay days 61-100 Outpatient: $40 co pay per visit     Co-Pay: visits per medical neccesity Home Health: 100%      Co-Pay: visits per medical neccesity DME: 80%     Co-Pay: 20% Providers: in network  SECONDARY: none  Medicaid Application Date:       Case Manager:  Disability Application Date:       Case Worker:   Emergency Tax adviser Information    Name Relation Home Work Mobile   Arthur [name],Arthur [name] Spouse [phone]  [phone]   Arthur [name],Arthur [name] Relative [phone]       Current Medical History  Patient Admitting Diagnosis: right MCA infarct  History of Present Illness:   Arthur [name] a 84 y.o.malewith history of CKD, CAD s/p  CABG, CVA, A fib s/p MAZE- chronic coumadin who was admitted on 07/11/17 with left sided weakness with right gaze preference. INR subtherapeutic at admission and CTA head neck showed calcified plaque of aorta and carotid bifurcation, right M2 superior division proximal occlusion with poor down stream collateralization and multiple areas of mild to moderate intracranial atherosclerosis. He underwent cerebral angio with complete revascularization  of R-ACA with IA TPA and IV TPA. He tolerated extubation without difficulty on 1/29. MRI/MRA brain done revealing acute moderate R-MCA, small right distal ACA infarcts, small right posterior watershed territory infarct with minimal petechial hemorrhage and old large left cerebellar infarct with successful revascularization of R-MCA occlusion  Total: 17 NIHSS  Past Medical History  Past Medical History:  Diagnosis Date  . Arthritis    "some in my joints" (01/24/2015)  . CKD (chronic kidney disease), stage II   . Coronary artery disease    a. s/p CABG in 2005 with MV repair and MAZE procedure. b. s/p DES to prox RCA 01/2015.   Marland Kitchen CVA (cerebrovascular accident) [facility]) ~ 2014   RIGHT BRAIN; denies residual on 01/24/2015  . Esophagitis    Distal esophagitis  . GERD (gastroesophageal reflux disease)   . Heart murmur   . History of hiatal hernia   . History of recurrent TIAs   . Hyperlipidemia   . Hypertension   . Hypertensive vascular disease   . MVP (mitral valve prolapse)   . Nephrolithiasis   . Odynophagia   . PAF (paroxysmal atrial fibrillation) ([place])   . Transudative pleural effusion     Family History  family history includes Heart attack in his mother; Hypertension in his father; Kidney failure in his father; Stroke in his father.  Prior Rehab/Hospitalizations:  Has the patient had major surgery during 100 days prior to admission? No  Current Medications   Current Facility-Administered Medications:  .   stroke:  mapping our early stages of recovery book, , Does not apply, Once, Aroor, Karena Addison R, MD .  0.9 %  sodium chloride infusion, , Intravenous, Continuous, Rosalin Hawking, MD, Last Rate: 40 mL/hr at 07/13/17 2041 .  acetaminophen (TYLENOL) suppository 650 mg, 650 mg, Rectal, Q4H PRN, Costello, Mary A, NP .  acetaminophen (TYLENOL) tablet 650 mg, 650 mg, Oral, Q4H PRN **OR** [DISCONTINUED] acetaminophen (TYLENOL) solution 650 mg, 650 mg, Per Tube, Q4H PRN **OR** [DISCONTINUED] acetaminophen (TYLENOL) suppository 650 mg, 650 mg, Rectal, Q4H PRN, Deveshwar, Sanjeev, MD .  amantadine (SYMMETREL) 50 MG/5ML solution 100 mg, 100 mg, Oral, BID, Rosalin Hawking, MD, 100 mg at 07/15/17 1136 .  aspirin EC tablet 325 mg, 325 mg, Oral, Daily, Rosalin Hawking, MD, 325 mg at 07/15/17 1136 .  enoxaparin (LOVENOX) injection 40 mg, 40 mg, Subcutaneous, Q24H, Rosalin Hawking, MD, 40 mg at 07/15/17 1135 .  levofloxacin (LEVAQUIN) IVPB 750 mg, 750 mg, Intravenous, Q48H, Skeet Simmer, [facility], Stopped at 07/14/17 2326 .  metoprolol tartrate (LOPRESSOR) injection 5 mg, 5 mg, Intravenous, Q5 min PRN, Aroor, Karena Addison R, MD .  pantoprazole (PROTONIX) EC tablet 40 mg, 40 mg, Oral, Daily, Rosalin Hawking, MD, 40 mg at 07/15/17 1135 .  phosphorus (K PHOS NEUTRAL) tablet 250 mg, 250 mg, Oral, TID WC & HS, Rosalin Hawking, MD, 250 mg at 07/15/17 0834 .  polyvinyl alcohol (LIQUIFILM TEARS) 1.4 % ophthalmic solution 1-2 drop, 1-2 drop, Left Eye, QID PRN, Rosalin Hawking, MD, 1 drop at 07/13/17 1600 .  rosuvastatin (CRESTOR) tablet 20 mg, 20 mg, Per Tube, q1800, Desai, Rahul P, PA-C, 20 mg at 07/14/17 1732  Patients Current Diet: Check puncture sites for bleeding or hematomas. Bleeding precautions Fall precautions DIET - DYS 1 Room service appropriate? Yes; Fluid consistency: Nectar Thick  Precautions / Restrictions Precautions Precautions: Fall Precaution Comments: L neglect Restrictions Weight Bearing Restrictions: No   Has the patient had 2 or more  falls or a fall with  injury in the past year?No  Prior Activity Level Community (5-7x/wk): Independent and driving pta  Development worker, international aid / [place] Devices/Equipment: None Home Equipment: Wheelchair - manual  Prior Device Use: Indicate devices/aids used by the patient prior to current illness, exacerbation or injury? None of the above  Prior Functional Level Prior Function Level of Independence: Independent Comments: ADLs, IADLs, driving  Self Care: Did the patient need help bathing, dressing, using the toilet or eating?  Independent  Indoor Mobility: Did the patient need assistance with walking from room to room (with or without device)? Independent  Stairs: Did the patient need assistance with internal or external stairs (with or without device)? Independent  Functional Cognition: Did the patient need help planning regular tasks such as shopping or remembering to take medications? Independent  Current Functional Level Cognition  Arousal/Alertness: Awake/alert Overall Cognitive Status: Impaired/Different from baseline Current Attention Level: Sustained Orientation Level: Oriented X4 Following Commands: Follows one step commands with increased time Safety/Judgement: Decreased awareness of deficits General Comments: patient continues to have some decreased intiation and slow processing during session. Remains conversational throughout session although language appears to require increased effort.  Attention: Selective Selective Attention: Appears intact Memory: Appears intact Awareness: Impaired Executive Function: Sequencing Sequencing: Impaired Sequencing Impairment: Verbal complex Behaviors: Lability    Extremity Assessment (includes Sensation/Coordination)  Upper Extremity Assessment: LUE deficits/detail LUE Deficits / Details: LUE with noted increased tone and synergistic pattern present, no isolated movement at this time. Brunnstromm  strage 3. No active movement for grasp.  LUE Coordination: decreased fine motor, decreased gross motor  Lower Extremity Assessment: LLE deficits/detail LLE Deficits / Details: noted increased tone throughout, trace muscle activitation once tone released. Brunstrom 3 pattern of synergistic movement at this time. LLE Sensation: decreased proprioception LLE Coordination: decreased gross motor, decreased fine motor    ADLs  Overall ADL's : Needs assistance/impaired Eating/Feeding: NPO Grooming: Wash/dry face, Maximal assistance, Sitting Grooming Details (indicate cue type and reason): Max A to maintain sitting at EOB. Pt washing his face with his RUE requiring increased time for processing.  Upper Body Bathing: Moderate assistance, Bed level(Supported sitting) Lower Body Bathing: Maximal assistance, +2 for physical assistance, Sit to/from stand, Bed level Upper Body Dressing : Moderate assistance, Bed level(Supported sitting) Lower Body Dressing: Maximal assistance, Bed level, Sit to/from stand, +2 for physical assistance Lower Body Dressing Details (indicate cue type and reason): don socks with Max A Functional mobility during ADLs: Maximal assistance, +2 for physical assistance(Sit<>stand only) General ADL Comments: Pt demonstrating decreased fucntional performance with poor functional use of LUE, vision, balance, and cognition.     Mobility  Overal bed mobility: Needs Assistance Bed Mobility: Rolling, Sidelying to Sit, Supine to Sit Rolling: Max assist, +2 for physical assistance Sidelying to sit: Max assist, +2 for physical assistance Supine to sit: Total assist, +2 for physical assistance General bed mobility comments: Hand-over-hand assist to reach for bed rail for support. Max +2 assist for transition to EOB with bed pad for scooting.    Transfers  Overall transfer level: Needs assistance Equipment used: 2 person hand held assist Transfers: Sit to/from Stand, Stand Pivot  Transfers Sit to Stand: Max assist, +2 physical assistance, From elevated surface Stand pivot transfers: Total assist, +2 physical assistance, From elevated surface General transfer comment: Noted some clonus in sitting after ~2 minutes. Able to be dissipated with light pressure at knee. Bed pad used to assist pt into standing x2, and squat pivot transfer initiated on second stand.  Ambulation / Gait / Stairs / Wheelchair Mobility  Ambulation/Gait General Gait Details: Unable    Posture / Balance Dynamic Sitting Balance Sitting balance - Comments: patient continues to show posterior bias but less extensor tone noted this session Balance Overall balance assessment: Needs assistance Sitting-balance support: Single extremity supported, Feet supported Sitting balance-Leahy Scale: Poor Sitting balance - Comments: patient continues to show posterior bias but less extensor tone noted this session Postural control: Posterior lean Standing balance-Leahy Scale: Zero    Special needs/care consideration BiPAP/CPAP  N/a CPM  N/a Continuous Drip IV  N/a Dialysis  N/a Life Vest  N/a Oxygen  N/a Special Bed  N/a Trach Size  N/a Wound Vac n/a Skin skin tears right and left arms and left hand Bowel mgmt: incontinent LBM 2/1 Bladder mgmt:external catheter Diabetic mgmt  N/a Recommend out pt TEE and long term heart monitoring at that time    Previous Home Environment Living Arrangements: Spouse/significant other  Lives With: Spouse Available Help at Discharge: (wife avialble 24/7; no children alive) Type of Home: House Home Layout: One level Home Access: Stairs to enter Entrance Stairs-Rails: Left Entrance Stairs-Number of Steps: 4 Bathroom Shower/Tub: Multimedia programmer: Standard Bathroom Accessibility: Yes How Accessible: Accessible via walker Home Care Services: No  Discharge Living Setting Plans for Discharge Living Setting: Patient's home, Lives with  (comment)(wife) Type of Home at Discharge: House Discharge Home Layout: One level Discharge Home Access: Stairs to enter Entrance Stairs-Rails: Left Entrance Stairs-Number of Steps: 4 Discharge Bathroom Shower/Tub: Walk-in shower Discharge Bathroom Toilet: Standard Discharge Bathroom Accessibility: Yes How Accessible: Accessible via walker Does the patient have any problems obtaining your medications?: No  Social/Family/Support Systems Patient Roles: Spouse Contact Information: Arthur [name], wife Anticipated Caregiver: wife and hired caregivers Anticipated Ambulance person Information: see above Ability/Limitations of Caregiver: wife can provide supervision level but aware she will need to hire caregivers Caregiver Availability: 24/7 Discharge Plan Discussed with Primary Caregiver: Yes Is Caregiver In Agreement with Plan?: Yes Does Caregiver/Family have Issues with Lodging/Transportation while Pt is in Rehab?: No  Goals/Additional Needs Patient/Family Goal for Rehab: Mod assist with PT, OT, and SLP Expected length of stay: ELOS 21-23 days Pt/Family Agrees to Admission and willing to participate: Yes Program Orientation Provided & Reviewed with Pt/Caregiver Including Roles  & Responsibilities: Yes  Decrease burden of Care through IP rehab admission: n/a  Possible need for SNF placement upon discharge:I have spoken with wife concerning need for physical 24/7 care after an inpt rehab admission. She states she will have to hire that care. I also explained that insurance is not guaranteed to pay for both inpt rehab and then SNF rehab if needed.   Patient Condition: This patient's medical and functional status has changed since the consult dated: 07/13/2017 in which the Rehabilitation Physician determined and documented that the patient's condition is appropriate for intensive rehabilitative care in an inpatient rehabilitation facility. See "History of Present Illness" (above) for medical  update. Functional changes are: overall max assist. Patient's medical and functional status update has been discussed with the Rehabilitation physician and patient remains appropriate for inpatient rehabilitation. Will admit to inpatient rehab today.  Preadmission Screen Completed By:  Cleatrice Burke, 07/15/2017 3:25 PM ______________________________________________________________________   Discussed status with Dr. Naaman Plummer on 07/15/2017 at  1525 and received telephone approval for admission today.  Admission Coordinator:  Cleatrice Burke, time 0630 Date 07/15/2017             Cosigned by: Meredith Staggers,  MD at 07/15/2017 3:41 PM  Revision History

## 2017-07-15 NOTE — Care Management Note (Signed)
Case Management Note  Patient Details  Name: Arthur [name] MRN: [id number] Date of Birth: 1929-01-18  Subjective/Objective:                    Action/Plan: Pt discharging to CIR today. No further needs per CM.  Expected Discharge Date:                  Expected Discharge Plan:  [place]  In-House Referral:     Discharge planning Services  CM Consult  Post Acute Care Choice:    Choice offered to:     DME Arranged:    DME Agency:     HH Arranged:    [place] Agency:     Status of Service:  Completed, signed off  If discussed at [employer] of Stay Meetings, dates discussed:    Additional Comments:  Pollie Friar, RN 07/15/2017, 3:22 PM

## 2017-07-15 NOTE — Progress Notes (Signed)
I await insurance approval for a possible inpt rehab admission today. [phone]

## 2017-07-15 NOTE — PMR Pre-admission (Signed)
PMR Admission Coordinator Pre-Admission Assessment  Patient: Arthur [name] is an 84 y.o., male MRN: [id number] DOB: 07/27/1928 Height: 5\' 8"  (172.7 cm) Weight: 80.2 kg (176 lb 12.9 oz)              Insurance Information HMO: yes    PPO:      PCP:      IPA:      80/20:      OTHER: medicare advantage plan PRIMARY: Blue Medicare      Policy#: [id number]      Subscriber: pt CM Name: Arthur [name]      Phone#: [phone]     Fax#: [phone] Pre-Cert#: [id number]   Approved for 7 days   Employer: retired Benefits:  Phone #: [phone]     Name:  Eff. Date: 06/14/2017     Deduct: none      Out of Pocket Max: $5500      Life Max: none CIR: $310 co pay per day days 1 to 6      SNF: no co pay days 1 to 20; $172 co pay per day days 21-60; no co pay days 61-100 Outpatient: $40 co pay per visit     Co-Pay: visits per medical neccesity Home Health: 100%      Co-Pay: visits per medical neccesity DME: 80%     Co-Pay: 20% Providers: in network  SECONDARY: none  Medicaid Application Date:       Case Manager:  Disability Application Date:       Case Worker:   Emergency Facilities manager Information    Name Relation Home Work Mobile   Arthur [name] Spouse [phone]  [phone]   Arthur [name] Relative [phone]       Current Medical History  Patient Admitting Diagnosis: right MCA infarct  History of Present Illness:   HPI: Arthur [name] a 84 y.o.malewith history of CKD, CAD s/p CABG, CVA, A fib s/p MAZE- chronic coumadin who was admitted on 07/11/17 with left sided weakness with right gaze preference. INR subtherapeutic at admission and CTA head neck showed calcified plaque of aorta and carotid bifurcation, right M2 superior division proximal occlusion with poor down stream collateralization and multiple areas of mild to moderate intracranial atherosclerosis. He underwent cerebral angio with complete revascularization of R-ACA with IA TPA and IV TPA. He tolerated  extubation without difficulty on 1/29. MRI/MRA brain done revealing acute moderate R-MCA, small right distal ACA infarcts, small right posterior watershed territory infarct with minimal petechial hemorrhage and old large left cerebellar infarct with successful revascularization of R-MCA occlusion  Total: 17 NIHSS    Past Medical History  Past Medical History:  Diagnosis Date  . Arthritis    "some in my joints" (01/24/2015)  . CKD (chronic kidney disease), stage II   . Coronary artery disease    a. s/p CABG in 2005 with MV repair and MAZE procedure. b. s/p DES to prox RCA 01/2015.   Marland Kitchen CVA (cerebrovascular accident) Arthur [name]) ~ 2014   RIGHT BRAIN; denies residual on 01/24/2015  . Esophagitis    Distal esophagitis  . GERD (gastroesophageal reflux disease)   . Heart murmur   . History of hiatal hernia   . History of recurrent TIAs   . Hyperlipidemia   . Hypertension   . Hypertensive vascular disease   . MVP (mitral valve prolapse)   . Nephrolithiasis   . Odynophagia   . PAF (paroxysmal atrial fibrillation) (Arthur [name])   . Transudative pleural effusion  Family History  family history includes Heart attack in his mother; Hypertension in his father; Kidney failure in his father; Stroke in his father.  Prior Rehab/Hospitalizations:  Has the patient had major surgery during 100 days prior to admission? No  Current Medications   Current Facility-Administered Medications:  .   stroke: mapping our early stages of recovery book, , Does not apply, Once, Aroor, Karena Addison R, MD .  0.9 %  sodium chloride infusion, , Intravenous, Continuous, Rosalin Hawking, MD, Last Rate: 40 mL/hr at 07/13/17 2041 .  acetaminophen (TYLENOL) suppository 650 mg, 650 mg, Rectal, Q4H PRN, Costello, Mary A, NP .  acetaminophen (TYLENOL) tablet 650 mg, 650 mg, Oral, Q4H PRN **OR** [DISCONTINUED] acetaminophen (TYLENOL) solution 650 mg, 650 mg, Per Tube, Q4H PRN **OR** [DISCONTINUED] acetaminophen (TYLENOL) suppository 650 mg,  650 mg, Rectal, Q4H PRN, Deveshwar, Sanjeev, MD .  amantadine (SYMMETREL) 50 MG/5ML solution 100 mg, 100 mg, Oral, BID, Rosalin Hawking, MD, 100 mg at 07/15/17 1136 .  aspirin EC tablet 325 mg, 325 mg, Oral, Daily, Rosalin Hawking, MD, 325 mg at 07/15/17 1136 .  enoxaparin (LOVENOX) injection 40 mg, 40 mg, Subcutaneous, Q24H, Rosalin Hawking, MD, 40 mg at 07/15/17 1135 .  levofloxacin (LEVAQUIN) IVPB 750 mg, 750 mg, Intravenous, Q48H, Skeet Simmer, [facility], Stopped at 07/14/17 2326 .  metoprolol tartrate (LOPRESSOR) injection 5 mg, 5 mg, Intravenous, Q5 min PRN, Aroor, Karena Addison R, MD .  pantoprazole (PROTONIX) EC tablet 40 mg, 40 mg, Oral, Daily, Rosalin Hawking, MD, 40 mg at 07/15/17 1135 .  phosphorus (K PHOS NEUTRAL) tablet 250 mg, 250 mg, Oral, TID WC & HS, Rosalin Hawking, MD, 250 mg at 07/15/17 0834 .  polyvinyl alcohol (LIQUIFILM TEARS) 1.4 % ophthalmic solution 1-2 drop, 1-2 drop, Left Eye, QID PRN, Rosalin Hawking, MD, 1 drop at 07/13/17 1600 .  rosuvastatin (CRESTOR) tablet 20 mg, 20 mg, Per Tube, q1800, Desai, Rahul P, PA-C, 20 mg at 07/14/17 1732  Patients Current Diet: Check puncture sites for bleeding or hematomas. Bleeding precautions Fall precautions DIET - DYS 1 Room service appropriate? Yes; Fluid consistency: Nectar Thick  Precautions / Restrictions Precautions Precautions: Fall Precaution Comments: L neglect Restrictions Weight Bearing Restrictions: No   Has the patient had 2 or more falls or a fall with injury in the past year?No  Prior Activity Level Community (5-7x/wk): Independent and driving pta  Development worker, international aid / [place] Devices/Equipment: None Home Equipment: Wheelchair - manual  Prior Device Use: Indicate devices/aids used by the patient prior to current illness, exacerbation or injury? None of the above  Prior Functional Level Prior Function Level of Independence: Independent Comments: ADLs, IADLs, driving  Self Care: Did the patient need help  bathing, dressing, using the toilet or eating?  Independent  Indoor Mobility: Did the patient need assistance with walking from room to room (with or without device)? Independent  Stairs: Did the patient need assistance with internal or external stairs (with or without device)? Independent  Functional Cognition: Did the patient need help planning regular tasks such as shopping or remembering to take medications? Independent  Current Functional Level Cognition  Arousal/Alertness: Awake/alert Overall Cognitive Status: Impaired/Different from baseline Current Attention Level: Sustained Orientation Level: Oriented X4 Following Commands: Follows one step commands with increased time Safety/Judgement: Decreased awareness of deficits General Comments: patient continues to have some decreased intiation and slow processing during session. Remains conversational throughout session although language appears to require increased effort.  Attention: Selective Selective Attention: Appears intact Memory:  Appears intact Awareness: Impaired Executive Function: Sequencing Sequencing: Impaired Sequencing Impairment: Verbal complex Behaviors: Lability    Extremity Assessment (includes Sensation/Coordination)  Upper Extremity Assessment: LUE deficits/detail LUE Deficits / Details: LUE with noted increased tone and synergistic pattern present, no isolated movement at this time. Brunnstromm strage 3. No active movement for grasp.  LUE Coordination: decreased fine motor, decreased gross motor  Lower Extremity Assessment: LLE deficits/detail LLE Deficits / Details: noted increased tone throughout, trace muscle activitation once tone released. Brunstrom 3 pattern of synergistic movement at this time. LLE Sensation: decreased proprioception LLE Coordination: decreased gross motor, decreased fine motor    ADLs  Overall ADL's : Needs assistance/impaired Eating/Feeding: NPO Grooming: Wash/dry face, Maximal  assistance, Sitting Grooming Details (indicate cue type and reason): Max A to maintain sitting at EOB. Pt washing his face with his RUE requiring increased time for processing.  Upper Body Bathing: Moderate assistance, Bed level(Supported sitting) Lower Body Bathing: Maximal assistance, +2 for physical assistance, Sit to/from stand, Bed level Upper Body Dressing : Moderate assistance, Bed level(Supported sitting) Lower Body Dressing: Maximal assistance, Bed level, Sit to/from stand, +2 for physical assistance Lower Body Dressing Details (indicate cue type and reason): don socks with Max A Functional mobility during ADLs: Maximal assistance, +2 for physical assistance(Sit<>stand only) General ADL Comments: Pt demonstrating decreased fucntional performance with poor functional use of LUE, vision, balance, and cognition.     Mobility  Overal bed mobility: Needs Assistance Bed Mobility: Rolling, Sidelying to Sit, Supine to Sit Rolling: Max assist, +2 for physical assistance Sidelying to sit: Max assist, +2 for physical assistance Supine to sit: Total assist, +2 for physical assistance General bed mobility comments: Hand-over-hand assist to reach for bed rail for support. Max +2 assist for transition to EOB with bed pad for scooting.    Transfers  Overall transfer level: Needs assistance Equipment used: 2 person hand held assist Transfers: Sit to/from Stand, Stand Pivot Transfers Sit to Stand: Max assist, +2 physical assistance, From elevated surface Stand pivot transfers: Total assist, +2 physical assistance, From elevated surface General transfer comment: Noted some clonus in sitting after ~2 minutes. Able to be dissipated with light pressure at knee. Bed pad used to assist pt into standing x2, and squat pivot transfer initiated on second stand.    Ambulation / Gait / Stairs / Wheelchair Mobility  Ambulation/Gait General Gait Details: Unable    Posture / Balance Dynamic Sitting  Balance Sitting balance - Comments: patient continues to show posterior bias but less extensor tone noted this session Balance Overall balance assessment: Needs assistance Sitting-balance support: Single extremity supported, Feet supported Sitting balance-Leahy Scale: Poor Sitting balance - Comments: patient continues to show posterior bias but less extensor tone noted this session Postural control: Posterior lean Standing balance-Leahy Scale: Zero    Special needs/care consideration BiPAP/CPAP  N/a CPM  N/a Continuous Drip IV  N/a Dialysis  N/a Life Vest  N/a Oxygen  N/a Special Bed  N/a Trach Size  N/a Wound Vac n/a Skin skin tears right and left arms and left hand Bowel mgmt: incontinent LBM 2/1 Bladder mgmt:external catheter Diabetic mgmt  N/a Recommend out pt TEE and long term heart monitoring at that time    Previous Home Environment Living Arrangements: Spouse/significant other  Lives With: Spouse Available Help at Discharge: (wife avialble 24/7; no children alive) Type of Home: House Home Layout: One level Home Access: Stairs to enter Entrance Stairs-Rails: Left Entrance Stairs-Number of Steps: 4 Bathroom Shower/Tub: Holiday representative  Toilet: Standard Bathroom Accessibility: Yes How Accessible: Accessible via walker Home Care Services: No  Discharge Living Setting Plans for Discharge Living Setting: Patient's home, Lives with (comment)(wife) Type of Home at Discharge: House Discharge Home Layout: One level Discharge Home Access: Stairs to enter Entrance Stairs-Rails: Left Entrance Stairs-Number of Steps: 4 Discharge Bathroom Shower/Tub: Walk-in shower Discharge Bathroom Toilet: Standard Discharge Bathroom Accessibility: Yes How Accessible: Accessible via walker Does the patient have any problems obtaining your medications?: No  Social/Family/Support Systems Patient Roles: Spouse Contact Information: Izora Gala, wife Anticipated Caregiver: wife and  hired caregivers Anticipated Ambulance person Information: see above Ability/Limitations of Caregiver: wife can provide supervision level but aware she will need to hire caregivers Caregiver Availability: 24/7 Discharge Plan Discussed with Primary Caregiver: Yes Is Caregiver In Agreement with Plan?: Yes Does Caregiver/Family have Issues with Lodging/Transportation while Pt is in Rehab?: No  Goals/Additional Needs Patient/Family Goal for Rehab: Mod assist with PT, OT, and SLP Expected length of stay: [facility] 21-23 days Pt/Family Agrees to Admission and willing to participate: Yes Program Orientation Provided & Reviewed with Pt/Caregiver Including Roles  & Responsibilities: Yes  Decrease burden of Care through IP rehab admission: n/a  Possible need for SNF placement upon discharge:I have spoken with wife concerning need for physical 24/7 care after an inpt rehab admission. She states she will have to hire that care. I also explained that insurance is not guaranteed to pay for both inpt rehab and then SNF rehab if needed.   Patient Condition: This patient's medical and functional status has changed since the consult dated: 07/13/2017 in which the Rehabilitation Physician determined and documented that the patient's condition is appropriate for intensive rehabilitative care in an inpatient rehabilitation facility. See "History of Present Illness" (above) for medical update. Functional changes are: overall max assist. Patient's medical and functional status update has been discussed with the Rehabilitation physician and patient remains appropriate for inpatient rehabilitation. Will admit to inpatient rehab today.  Preadmission Screen Completed By:  Cleatrice Burke, 07/15/2017 3:25 PM ______________________________________________________________________   Discussed status with Dr. Naaman Plummer on 07/15/2017 at  1525 and received telephone approval for admission today.  Admission Coordinator:   Cleatrice Burke, time 1324 Date 07/15/2017

## 2017-07-15 NOTE — Progress Notes (Signed)
Patient discharging to CIR. All belonging sent with Patient. Transported by stretcher

## 2017-07-15 NOTE — H&P (Signed)
Physical Medicine and Rehabilitation Admission H&P     Chief Complaint  Patient presents with  . Functional deficits due to Stroke  HPI: Arthur [name] is a 84 y.o. male with history of CKD, CAD s/p CABG, CVA, A fib s/p MAZE- chronic coumadin who was admitted on 07/11/17 with left sided weakness with right gaze preference. INR subtherapeutic at admission and CTA head neck showed calcified plaque of aorta and carotid bifurcation, right M2 superior division proximal occlusion with poor down stream collateralization and multiple areas of mild to moderate intracranial atherosclerosis. He underwent cerebral angio with complete revascularization of R-ACA with IA tPA and IV tPA. He tolerated extubation without difficulty on 1/29. MRI/MRA brain done revealing acute moderate R-MCA, small right distal ACA infarcts, small right posterior watershed territory infarct with minimal petechial hemorrhage and old large left cerebellar infarct with successful revascularization of R-MCA occlusion.  MBS done yesterday and he was placed on Dysphagia 1, nectar liquids. He developed fever yesterday pm with leucocytosis and CXR showed low lung volumes. UA negative and he was started on Levaquin due to concerns of aspiration PNA. Stroke felt to be embolic due to subtherapeutic coumadin. Anticoagulation on hold due to size of stroke--now on ASA with recommendations to resume anticoagulation in 5-7 days. Patient with resultant left hemiparesis, left inattention with right gaze preference, dysphagia and left facial droop. Therapy ongoing and CIR recommended due to functional deficits.  Review of Systems  Constitutional: Negative for chills and fever.  HENT: Negative for hearing loss and tinnitus.  Eyes: Positive for redness. Negative for blurred vision, photophobia and pain.  Drainage left eye  Respiratory: Negative for cough, shortness of breath and wheezing.  Cardiovascular: Negative for chest pain and palpitations.    Gastrointestinal: Negative for constipation, heartburn and nausea.  Musculoskeletal: Positive for joint pain (chronic right knee pain due to old injury).  Skin: Negative for itching and rash.  Neurological: Positive for speech change and focal weakness. Negative for dizziness and headaches.  Psychiatric/Behavioral: Negative for depression. The patient does not have insomnia.       Past Medical History:  Diagnosis Date  . Arthritis    "some in my joints" (01/24/2015)  . CKD (chronic kidney disease), stage II   . Coronary artery disease    a. s/p CABG in 2005 with MV repair and MAZE procedure. b. s/p DES to prox RCA 01/2015.   Marland Kitchen CVA (cerebrovascular accident) [facility]) ~ 2014   RIGHT BRAIN; denies residual on 01/24/2015  . Esophagitis    Distal esophagitis  . GERD (gastroesophageal reflux disease)   . Heart murmur   . History of hiatal hernia   . History of recurrent TIAs   . Hyperlipidemia   . Hypertension   . Hypertensive vascular disease   . MVP (mitral valve prolapse)   . Nephrolithiasis   . Odynophagia   . PAF (paroxysmal atrial fibrillation) ([place])   . Transudative pleural effusion         Past Surgical History:  Procedure Laterality Date  . APPENDECTOMY  1960's  . CARDIAC CATHETERIZATION  03/19/2004  . CARDIAC CATHETERIZATION N/A 01/24/2015   Procedure: Left Heart Cath and Coronary Angiography; Surgeon: Peter M Martinique, MD; Location: [facility]; Service: Cardiovascular; Laterality: N/A;  . CARDIAC CATHETERIZATION N/A 01/24/2015   Procedure: Coronary Stent Intervention; Surgeon: Peter M Martinique, MD; Location: [facility]; Service: Cardiovascular; Laterality: N/A;  . CORONARY ANGIOPLASTY WITH STENT PLACEMENT  01/24/2015   "1 stent"  .  CORONARY ARTERY BYPASS GRAFT  04/2004   LIMA GRAFT TO THE DISTAL LAD, SAPHENOUS VEIN GRAFT TO THE FIRST DIADGONAL BRANCH, SAPHENOUS VEIN GRAFT TO THE THIRD MARIGINAL BRANCH, AND SAPHENOUS VEIN GRAFT TO THE PDA  . IR ANGIO VERTEBRAL SEL  SUBCLAVIAN INNOMINATE UNI R MOD SED  07/11/2017  . IR PERCUTANEOUS ART THROMBECTOMY/INFUSION INTRACRANIAL INC DIAG ANGIO  07/11/2017  . MAZE  04/2004  . MITRAL VALVE REPAIR  04/2004  . RADIOLOGY WITH ANESTHESIA N/A 07/11/2017   Procedure: RADIOLOGY WITH ANESTHESIA; Surgeon: Luanne Bras, MD; Location: [place]; Service: Radiology; Laterality: N/A;  . TEE WITHOUT CARDIOVERSION  05/19/2011   Procedure: TRANSESOPHAGEAL ECHOCARDIOGRAM (TEE); Surgeon: Peter Martinique, MD; Location: [facility] ENDOSCOPY; Service: Cardiovascular; Laterality: N/A;  . TONSILLECTOMY AND ADENOIDECTOMY  1944        Family History  Problem Relation Age of Onset  . Heart attack Mother   . Stroke Father   . Hypertension Father   . Kidney failure Father    Social History: Married. Retired Hotel manager. Was independent and relatively active per wife. Per reports that he quit smoking about 62 years ago. His smoking use included cigarettes. He quit after 3.00 years of use. he has never used smokeless tobacco. He reports that he does not drink alcohol or use drugs.       Allergies  Allergen Reactions  . Tape Other (See Comments)    PATIENT IS TAKING COUMADIN; HIS SKIN TEARS & BRUISES EASILY; PLEASE USE COBAN WRAP OR AN ALTERNATIVE TO MEDICAL TAPE!!  . Lasix [Furosemide] Rash  . Penicillins Rash    Has patient had a PCN reaction causing immediate rash, facial/tongue/throat swelling, SOB or lightheadedness with hypotension: Yes  Has patient had a PCN reaction causing severe rash involving mucus membranes or skin necrosis: Unk  Has patient had a PCN reaction that required hospitalization: Unk  Has patient had a PCN reaction occurring within the last 10 years:  If all of the above answers are "NO", then may proceed with Cephalosporin use.   . Sulfa Drugs Cross Reactors Rash         Medications Prior to Admission  Medication Sig Dispense Refill  . acetaminophen (TYLENOL) 650 MG CR tablet Take 650 mg by mouth daily as needed (for knee  pain).    Marland Kitchen amLODipine (NORVASC) 5 MG tablet TAKE ONE TABLET BY MOUTH DAILY (Patient taking differently: Take 5 mg by mouth once a day) 90 tablet 1  . benazepril (LOTENSIN) 40 MG tablet TAKE ONE TABLET BY MOUTH DAILY (Patient taking differently: Take 40 mg by mouth once a day) 78 tablet 3  . carvedilol (COREG) 25 MG tablet TAKE ONE TABLET BY MOUTH TWICE A DAY (Patient taking differently: Take 25 mg by mouth two times a day) 60 tablet 5  . NITROSTAT 0.4 MG SL tablet PLACE 1 TABLET (0.4 MG TOTAL) UNDER THE TONGUE EVERY 5 (FIVE) MINUTESAS NEEDED FOR CHEST PAIN (UP TO 3 DOSES). 25 tablet 2  . ranitidine (ZANTAC) 150 MG tablet TAKE ONE TABLET BY MOUTH TWICE A DAY (Patient taking differently: Take 150 mg by mouth two times a day) 180 tablet 2  . rosuvastatin (CRESTOR) 20 MG tablet TAKE 1 TABLET BY MOUTH DAILY (Patient taking differently: Take 20 mg by mouth at bedtime) 90 tablet 2  . warfarin (COUMADIN) 2.5 MG tablet TAKE 1/2 TO 1 TABLET BY MOUTH DAILY AS DIRECTED BY COUMADIN CLINIC (Patient taking differently: Take 1.25 mg by mouth at bedtime on Sun/Tues/Thurs/Sat and 2.5 mg on Mon/Wed/Fri)  30 tablet 2  . warfarin (COUMADIN) 2.5 MG tablet TAKE 1/2 TO 1 TABLET BY MOUTH DAILY AS DIRECTED BY COUMADIN CLINIC (Patient not taking: Reported on 07/11/2017) 90 tablet 1   Drug Regimen Review  Drug regimen was reviewed and remains appropriate with no significant issues identified  Home:  Home Living  Family/patient expects to be discharged to:: Inpatient rehab  Living Arrangements: Spouse/significant other  Available Help at Discharge: (wife avialble 24/7; no children alive)  Type of Home: House  Home Access: Stairs to enter  Technical brewer of Steps: 4  Entrance Stairs-Rails: Left  Home Layout: One level  Bathroom Shower/Tub: Tourist information centre manager: Environmental health practitioner: Yes  Home Equipment: Wheelchair - manual  Lives With: Spouse  Functional History:  Prior Function  Level  of Independence: Independent  Comments: ADLs, IADLs, driving  Functional Status:  Mobility:  Bed Mobility  Overal bed mobility: Needs Assistance  Bed Mobility: Rolling, Sidelying to Sit, Supine to Sit  Rolling: Max assist, +2 for physical assistance  Sidelying to sit: Max assist, +2 for physical assistance  Supine to sit: Total assist, +2 for physical assistance  General bed mobility comments: Hand-over-hand assist to reach for bed rail for support. Max +2 assist for transition to EOB with bed pad for scooting.  Transfers  Overall transfer level: Needs assistance  Equipment used: 2 person hand held assist  Transfers: Sit to/from Stand, Stand Pivot Transfers  Sit to Stand: Max assist, +2 physical assistance, From elevated surface  Stand pivot transfers: Total assist, +2 physical assistance, From elevated surface  General transfer comment: Noted some clonus in sitting after ~2 minutes. Able to be dissipated with light pressure at knee. Bed pad used to assist pt into standing x2, and squat pivot transfer initiated on second stand.  Ambulation/Gait  General Gait Details: Unable   ADL:  ADL  Overall ADL's : Needs assistance/impaired  Eating/Feeding: NPO  Grooming: Wash/dry face, Maximal assistance, Sitting  Grooming Details (indicate cue type and reason): Max A to maintain sitting at EOB. Pt washing his face with his RUE requiring increased time for processing.  Upper Body Bathing: Moderate assistance, Bed level(Supported sitting)  Lower Body Bathing: Maximal assistance, +2 for physical assistance, Sit to/from stand, Bed level  Upper Body Dressing : Moderate assistance, Bed level(Supported sitting)  Lower Body Dressing: Maximal assistance, Bed level, Sit to/from stand, +2 for physical assistance  Lower Body Dressing Details (indicate cue type and reason): don socks with Max A  Functional mobility during ADLs: Maximal assistance, +2 for physical assistance(Sit<>stand only)  General ADL  Comments: Pt demonstrating decreased fucntional performance with poor functional use of LUE, vision, balance, and cognition.  Cognition:  Cognition  Overall Cognitive Status: Impaired/Different from baseline  Arousal/Alertness: Awake/alert  Orientation Level: Oriented X4  Attention: Selective  Selective Attention: Appears intact  Memory: Appears intact  Awareness: Impaired  Executive Function: Sequencing  Sequencing: Impaired  Sequencing Impairment: Verbal complex  Behaviors: Lability  Cognition  Arousal/Alertness: Awake/alert  Behavior During Therapy: WFL for tasks assessed/performed  Overall Cognitive Status: Impaired/Different from baseline  Area of Impairment: Following commands, Safety/judgement, Awareness, Problem solving, Attention  Current Attention Level: Sustained  Following Commands: Follows one step commands with increased time  Safety/Judgement: Decreased awareness of deficits  Awareness: Intellectual  Problem Solving: Requires verbal cues, Requires tactile cues, Slow processing  General Comments: patient continues to have some decreased intiation and slow processing during session. Remains conversational throughout session although language appears to require  increased effort.  Blood pressure (!) 142/65, pulse 79, temperature 98.6 F (37 C), temperature source Oral, resp. rate 18, height 5\' 8"  (1.727 m), weight 80.2 kg (176 lb 12.9 oz), SpO2 99 %.  Physical Exam  Vitals reviewed.  Constitutional: He is oriented to person, place, and time. He appears well-developed and well-nourished. No distress.  Up in a chair. Fatigued appearing.  HENT:  Head: Normocephalic and atraumatic.  Eyes: Pupils are equal, round, and reactive to light. Left eye exhibits discharge. Left conjunctiva is injected.  Left ptosis with ectropion and inability to move eye to left field. Crusting noted on left eyelids.  Neck: Normal range of motion. Neck supple.  Cardiovascular: Normal rate. An  irregular rhythm present.  Respiratory: Effort normal. No tachypnea. He has decreased breath sounds. He has no wheezes. He exhibits no tenderness.  GI: Soft. Bowel sounds are normal. There is no tenderness.  Musculoskeletal: He exhibits no edema or tenderness.  Neurological: He is alert and oriented to person, place, and time.  Left facial weakness with ptosis and occasional wet sounds due to holding of saliva. Ataxic/ dysarthric speech. Left inattention but able to attend to left with cues. Dense left hemiparesis with flexor tone LUE and extensor tone LLE. Left heel cord tight.  Skin: Skin is warm and dry. He is not diaphoretic.  Psychiatric: His affect is blunt. His speech is delayed and slurred. He is slowed.   Lab Results Last 48 Hours                                                                                                                                                                                                                                                                                                                                            Imaging Results (Last 48 hours)     Medical Problem List and Plan:  1. Functional deficits  and left hemiparesis secondary to right MCA infarct, small right ACA infarct, posterior watershed infarct due to cardio-embolic source  -admit to inpatient rehab  2. DVT Prophylaxis/Anticoagulation: Pharmaceutical: Lovenox  -to begin eliquis in 5-7 days  3. Pain Management: tylenol prn  4. Mood: LCSW to follow for evaluation and support  5. Neuropsych: This patient is not capable of making decisions on his own behalf.  6. Skin/Wound Care: routine pressure relief measures  7. Fluids/Electrolytes/Nutrition: monitor I/O. Offer liquids between meals to maintain adequate hydration. Change IVF to nights to avoid overload  8. CAD s/p Maze/ Chronic A fib: Monitor HR bid. On ASA--to start  Eliquis in 5-7 days.  9. FUO: Continue Levaquin D# 2  10 GERD: Managed with Protonix  11. CKD stage II: Baseline SCr 1.2. Avoid nephrotoxic medications and monitor lytes serially.  12. HTN: Monitor BP bid--Coreg, Lotensin, Amlodipine and  13. Malnutrition? Electrolyte abnormality: Continue Phosphorous and potassium supplement. Recheck labs in am.  14. Irritation left eye: Continue moisturizing drop with patching at nights.  Post Admission Physician Evaluation:  1. Functional deficits secondary to embolic CVA. 2. Patient is admitted to receive collaborative, interdisciplinary care between the physiatrist, rehab nursing staff, and therapy team. 3. Patient's level of medical complexity and substantial therapy needs in context of that medical necessity cannot be provided at a lesser intensity of care such as a SNF. 4. Patient has experienced substantial functional loss from his/her baseline which was documented above under the "Functional History" and "Functional Status" headings. Judging by the patient's diagnosis, physical exam, and functional history, the patient has potential for functional progress which will result in measurable gains while on inpatient rehab. These gains will be of substantial and practical use upon discharge in facilitating mobility and self-care at the household level. 5. Physiatrist will provide 24 hour management of medical needs as well as oversight of the therapy plan/treatment and provide guidance as appropriate regarding the interaction of the two. 6. The Preadmission Screening has been reviewed and patient status is unchanged unless otherwise stated above. 7. 24 hour rehab nursing will assist with bladder management, bowel management, safety, skin/wound care, disease management, medication administration, pain management and patient education and help integrate therapy concepts, techniques,education, etc. 8. PT will assess and treat for/with: Lower extremity strength,  range of motion, stamina, balance, functional mobility, safety, adaptive techniques and equipment, NMR, visual-spatial awareness, spasticity mgt. Goals are: mod assist. 9. OT will assess and treat for/with: ADL's, functional mobility, safety, upper extremity strength, adaptive techniques and equipment, NMR, spasticity mgt, family ed. Goals are: mod assist. Therapy may proceed with showering this patient. 10. SLP will assess and treat for/with: cognition, communication, swallowing, family education. Goals are: mod assist. 11. Case Management and Social Worker will assess and treat for psychological issues and discharge planning. 12. Team conference will be held weekly to assess progress toward goals and to determine barriers to discharge. 13. Patient will receive at least 3 hours of therapy per day at least 5 days per week. 14. ELOS: 21-23 days  15. Prognosis: excellent   Meredith Staggers, MD, [place]  07/15/2017  Bary Leriche, PA-C  07/15/2017

## 2017-07-15 NOTE — Progress Notes (Signed)
Physical Therapy Treatment Patient Details Name: Arthur [name] MRN: [id number] DOB: 1928/12/17 Today's Date: 07/15/2017    History of Present Illness 84 y.o.malewith hx of afib, LAA thrombus s/p MAZE procedure, CAD s/p CABG, CVA in 05/2011 with left large cerebellar infarct, HTN, HLDadmitted for left hemiplegia and right gaze, left neglect and left facial droop.TPA given. Work up revealed rightMCA and ACA patchy scattered moderate to largeinfarct s/p IV and IA    PT Comments    Pt is making good progress towards his goals and is motivated to participate in therapy today. Pt maxAx2 for coming to EoB, and lateral scoot transfer to recliner. Pt able to participate in trunk righting exercises, L sided weightbearing. D/c plan remains appropriate.    Follow Up Recommendations  CIR;Supervision/Assistance - 24 hour     Equipment Recommendations  Other (comment)    Recommendations for Other Services Rehab consult     Precautions / Restrictions Precautions Precautions: Fall Precaution Comments: L neglect Restrictions Weight Bearing Restrictions: No    Mobility  Bed Mobility Overal bed mobility: Needs Assistance Bed Mobility: Rolling;Sidelying to Sit;Sit to Sidelying Rolling: Mod assist Sidelying to sit: Max assist Supine to sit: Max assist     General bed mobility comments: maxA for management of LE to floor and trunk to upright, Worked on leaning on R elbow to minimize pushing L, worked on maintaining midline working agaist  R sided and Aeronautical engineer Overall transfer level: Needs assistance Equipment used: 2 person hand held assist Transfers: Lateral/Scoot Transfers Sit to Stand: Max assist;+2 physical assistance;From elevated surface        Lateral/Scoot Transfers: Max assist;+2 physical assistance;From elevated surface General transfer comment: utilized pt tendency to push R to aid in Lateral scoot transfer to recliner on his L, required 3x squat pivot to  accomplish      Modified Rankin (Stroke Patients Only) Modified Rankin (Stroke Patients Only) Pre-Morbid Rankin Score: No symptoms Modified Rankin: Severe disability     Balance     Sitting balance-Leahy Scale: Poor Sitting balance - Comments: L Lat lean Postural control: Left lateral lean;Posterior lean   Standing balance-Leahy Scale: Zero                              Cognition Arousal/Alertness: Awake/alert Behavior During Therapy: WFL for tasks assessed/performed Overall Cognitive Status: Impaired/Different from baseline Area of Impairment: Attention;Memory;Safety/judgement;Awareness;Problem solving                   Current Attention Level: Sustained Memory: Decreased short-term memory Following Commands: Follows one step commands consistently Safety/Judgement: Decreased awareness of safety;Decreased awareness of deficits Awareness: Intellectual Problem Solving: Slow processing;Decreased initiation;Difficulty sequencing;Requires verbal cues;Requires tactile cues        Exercises Other Exercises Other Exercises: Facilitation of rolling Other Exercises: worked on midline postural control using targets to maintain midline with mod A at times Other Exercises: Left in L sidelying with L shoulder inER to weight bear through L side and increase attention to L        Pertinent Vitals/Pain Pain Assessment: No/denies pain    Home Living     Available Help at Discharge: (wife avialble 24/7; no children alive)                    PT Goals (current goals can now be found in the care plan section) Acute Rehab PT Goals Patient Stated Goal: to get better  PT Goal Formulation: With patient/family Time For Goal Achievement: 07/26/17 Potential to Achieve Goals: Fair Progress towards PT goals: Progressing toward goals    Frequency    Min 4X/week      PT Plan Current plan remains appropriate       AM-PAC PT "6 Clicks" Daily Activity   Outcome Measure  Difficulty turning over in bed (including adjusting bedclothes, sheets and blankets)?: Unable Difficulty moving from lying on back to sitting on the side of the bed? : Unable Difficulty sitting down on and standing up from a chair with arms (e.g., wheelchair, bedside commode, etc,.)?: Unable Help needed moving to and from a bed to chair (including a wheelchair)?: Total Help needed walking in hospital room?: Total Help needed climbing 3-5 steps with a railing? : Total 6 Click Score: 6    End of Session Equipment Utilized During Treatment: Gait belt Activity Tolerance: Patient tolerated treatment well Patient left: in chair;with call bell/phone within reach Nurse Communication: Mobility status PT Visit Diagnosis: Other symptoms and signs involving the nervous system (R29.898);Hemiplegia and hemiparesis Hemiplegia - Right/Left: Left Hemiplegia - dominant/non-dominant: Dominant Hemiplegia - caused by: Cerebral infarction     Time: 3419-3790 PT Time Calculation (min) (ACUTE ONLY): 28 min  Charges:  $Therapeutic Exercise: 8-22 mins $Therapeutic Activity: 8-22 mins                    G Codes:       Daxtyn Rottenberg B. Migdalia Dk PT, DPT Acute Rehabilitation  [phone] Pager [phone]     Arthur [name] 07/15/2017, 4:54 PM

## 2017-07-15 NOTE — Progress Notes (Signed)
Physical Medicine and Rehabilitation Consult   Reason for Consult: Stroke with functional deficits Referring Physician: Dr. Erlinda Hong   HPI: Arthur [name] is a 84 y.o. male with history of CKD, CAD s/p CABG, CVA, A fib s/p MAZE- chronic coumadin who was admitted on 07/11/17 with left sided weakness with right gaze preference. INR subtherapeutic at admission and CTA head neck showed calcified plaque of aorta and carotid bifurcation, right M2 superior division proximal occlusion with poor down stream collateralization and multiple areas of mild to moderate intracranial atherosclerosis. He underwent cerebral angio with complete revascularization of R-ACA with IA tPA and IV tPA.  He tolerated extubation without difficulty on 1/29.  MRI/MRA brain done revealing acute moderate R-MCA, small right distal ACA infarcts, small right posterior watershed territory infarct with minimal petechial hemorrhage and old large left cerebellar infarct with successful revascularization of R-MCA occlusion.   Therapy evaluations done revealing spastic dysarthria, left inattention as well as left hemiparesis affecting functional status. CIR recommended due to current impairments.    Review of Systems  Constitutional: Negative for chills and fever.  HENT: Negative for hearing loss and tinnitus.   Eyes: Negative for blurred vision and double vision.  Respiratory: Negative for cough and hemoptysis.   Cardiovascular: Negative for chest pain and palpitations.  Gastrointestinal: Negative for abdominal pain, heartburn and nausea.  Genitourinary: Negative for dysuria.  Musculoskeletal: Positive for joint pain (right knee due to old football injury).  Neurological: Positive for speech change, focal weakness and weakness. Negative for dizziness and headaches.  Psychiatric/Behavioral: Negative for depression. The patient is not nervous/anxious.         Past Medical History:  Diagnosis Date  . Arthritis     "some in my joints" (01/24/2015)  . CKD (chronic kidney disease), stage II   . Coronary artery disease    a. s/p CABG in 2005 with MV repair and MAZE procedure. b. s/p DES to prox RCA 01/2015.   Marland Kitchen CVA (cerebrovascular accident) [facility]) ~ 2014   RIGHT BRAIN; denies residual on 01/24/2015  . Esophagitis    Distal esophagitis  . GERD (gastroesophageal reflux disease)   . Heart murmur   . History of hiatal hernia   . History of recurrent TIAs   . Hyperlipidemia   . Hypertension   . Hypertensive vascular disease   . MVP (mitral valve prolapse)   . Nephrolithiasis   . Odynophagia   . PAF (paroxysmal atrial fibrillation) ([place])   . Transudative pleural effusion          Past Surgical History:  Procedure Laterality Date  . APPENDECTOMY  1960's  . CARDIAC CATHETERIZATION  03/19/2004  . CARDIAC CATHETERIZATION N/A 01/24/2015   Procedure: Left Heart Cath and Coronary Angiography;  Surgeon: Peter M Martinique, MD;  Location: [place] [facility];  Service: Cardiovascular;  Laterality: N/A;  . CARDIAC CATHETERIZATION N/A 01/24/2015   Procedure: Coronary Stent Intervention;  Surgeon: Peter M Martinique, MD;  Location: [place] [facility];  Service: Cardiovascular;  Laterality: N/A;  . CORONARY ANGIOPLASTY WITH STENT PLACEMENT  01/24/2015   "1 stent"  . CORONARY ARTERY BYPASS GRAFT  04/2004   LIMA GRAFT TO THE DISTAL LAD, SAPHENOUS VEIN GRAFT TO THE FIRST DIADGONAL BRANCH, SAPHENOUS VEIN GRAFT TO THE THIRD MARIGINAL BRANCH, AND SAPHENOUS VEIN GRAFT TO THE PDA  . MAZE  04/2004  . MITRAL VALVE REPAIR  04/2004  . RADIOLOGY WITH ANESTHESIA N/A 07/11/2017   Procedure: RADIOLOGY WITH ANESTHESIA;  Surgeon: Estanislado Pandy,  Willaim Rayas, MD;  Location: [place];  Service: Radiology;  Laterality: N/A;  . TEE WITHOUT CARDIOVERSION  05/19/2011   Procedure: TRANSESOPHAGEAL ECHOCARDIOGRAM (TEE);  Surgeon: Peter Martinique, MD;  Location: [facility];  Service: Cardiovascular;  Laterality: N/A;  .  TONSILLECTOMY AND ADENOIDECTOMY  1944         Family History  Problem Relation Age of Onset  . Heart attack Mother   . Stroke Father   . Hypertension Father   . Kidney failure Father     Social History:  Married. Retired Hotel manager. Was independent and relatively active per wife. Per reports that he quit smoking about 62 years ago. His smoking use included cigarettes. He quit after 3.00 years of use. he has never used smokeless tobacco. He reports that he does not drink alcohol or use drugs.         Allergies  Allergen Reactions  . Tape Other (See Comments)    PATIENT IS TAKING COUMADIN; HIS SKIN TEARS & BRUISES EASILY; PLEASE USE COBAN WRAP OR AN ALTERNATIVE TO MEDICAL TAPE!!  . Lasix [Furosemide] Rash  . Penicillins Rash    Has patient had a PCN reaction causing immediate rash, facial/tongue/throat swelling, SOB or lightheadedness with hypotension: Yes Has patient had a PCN reaction causing severe rash involving mucus membranes or skin necrosis: Unk Has patient had a PCN reaction that required hospitalization: Unk Has patient had a PCN reaction occurring within the last 10 years:  If all of the above answers are "NO", then may proceed with Cephalosporin use.   . Sulfa Drugs Cross Reactors Rash          Medications Prior to Admission  Medication Sig Dispense Refill  . acetaminophen (TYLENOL) 650 MG CR tablet Take 650 mg by mouth daily as needed (for knee pain).    Marland Kitchen amLODipine (NORVASC) 5 MG tablet TAKE ONE TABLET BY MOUTH DAILY (Patient taking differently: Take 5 mg by mouth once a day) 90 tablet 1  . benazepril (LOTENSIN) 40 MG tablet TAKE ONE TABLET BY MOUTH DAILY (Patient taking differently: Take 40 mg by mouth once a day) 78 tablet 3  . carvedilol (COREG) 25 MG tablet TAKE ONE TABLET BY MOUTH TWICE A DAY (Patient taking differently: Take 25 mg by mouth two times a day) 60 tablet 5  . NITROSTAT 0.4 MG SL tablet PLACE 1 TABLET (0.4 MG TOTAL) UNDER THE  TONGUE EVERY 5 (FIVE) MINUTESAS NEEDED FOR CHEST PAIN (UP TO 3 DOSES). 25 tablet 2  . ranitidine (ZANTAC) 150 MG tablet TAKE ONE TABLET BY MOUTH TWICE A DAY (Patient taking differently: Take 150 mg by mouth two times a day) 180 tablet 2  . rosuvastatin (CRESTOR) 20 MG tablet TAKE 1 TABLET BY MOUTH DAILY (Patient taking differently: Take 20 mg by mouth at bedtime) 90 tablet 2  . warfarin (COUMADIN) 2.5 MG tablet TAKE 1/2 TO 1 TABLET BY MOUTH DAILY AS DIRECTED BY COUMADIN CLINIC (Patient taking differently: Take 1.25 mg by mouth at bedtime on Sun/Tues/Thurs/Sat and 2.5 mg on Mon/Wed/Fri) 30 tablet 2  . warfarin (COUMADIN) 2.5 MG tablet TAKE 1/2 TO 1 TABLET BY MOUTH DAILY AS DIRECTED BY COUMADIN CLINIC (Patient not taking: Reported on 07/11/2017) 90 tablet 1    Home: Home Living Family/patient expects to be discharged to:: Private residence Living Arrangements: Spouse/significant other Available Help at Discharge: Family, Available 24 hours/day Type of Home: House Home Access: Stairs to enter Technical brewer of Steps: 4 Entrance Stairs-Rails: Left Home Layout: One level Bathroom  Shower/Tub: Multimedia programmer: Programmer, systems: Yes Home Equipment: Wheelchair - manual  Lives With: Spouse  Functional History: Prior Function Level of Independence: Independent Comments: ADLs, IADLs, driving Functional Status:  Mobility: Bed Mobility Overal bed mobility: Needs Assistance Bed Mobility: Rolling, Sidelying to Sit, Supine to Sit Rolling: Max assist, +2 for physical assistance Sidelying to sit: Max assist, +2 for physical assistance Supine to sit: Total assist, +2 for physical assistance General bed mobility comments: Max assist of 2 persons for rolling to left side, multi modal cues for RUE placement on bed rail, increased extensor tone noted throughout left side requiring maximal to total assist to elevate to upright and rotate to EOB. Total assist to return  to supine post activity Transfers Overall transfer level: Needs assistance Equipment used: 2 person hand held assist(face to face  with gait belt) Transfers: Sit to/from Stand Sit to Stand: Max assist, +2 physical assistance General transfer comment: +2 max assist to power to standing with face to face and gait belt. LLE positioned prior to initiation of mevement, noted LLE clonus requiring manual block out. Use of extensor tone to facilitate power up.  ADL: ADL Overall ADL's : Needs assistance/impaired Eating/Feeding: NPO Grooming: Wash/dry face, Maximal assistance, Sitting Grooming Details (indicate cue type and reason): Max A to maintain sitting at EOB. Pt washing his face with his RUE requiring increased time for processing.  Upper Body Bathing: Moderate assistance, Bed level(Supported sitting) Lower Body Bathing: Maximal assistance, +2 for physical assistance, Sit to/from stand, Bed level Upper Body Dressing : Moderate assistance, Bed level(Supported sitting) Lower Body Dressing: Maximal assistance, Bed level, Sit to/from stand, +2 for physical assistance Lower Body Dressing Details (indicate cue type and reason): don socks with Max A Functional mobility during ADLs: Maximal assistance, +2 for physical assistance(Sit<>stand only) General ADL Comments: Pt demonstrating decreased fucntional performance with poor functional use of LUE, vision, balance, and cognition.   Cognition: Cognition Overall Cognitive Status: Impaired/Different from baseline Arousal/Alertness: Awake/alert Orientation Level: Oriented X4 Attention: Selective Selective Attention: Appears intact Memory: Appears intact Awareness: Impaired Executive Function: Sequencing Sequencing: Impaired Sequencing Impairment: Verbal complex Behaviors: Lability Cognition Arousal/Alertness: Awake/alert Behavior During Therapy: WFL for tasks assessed/performed Overall Cognitive Status: Impaired/Different from baseline Area  of Impairment: Following commands, Safety/judgement, Awareness, Problem solving, Attention Current Attention Level: Sustained Following Commands: Follows one step commands with increased time Safety/Judgement: Decreased awareness of deficits Awareness: Intellectual Problem Solving: Requires verbal cues, Requires tactile cues, Slow processing General Comments: Patient with decreased attention to left side, multi modal cues to attend. visual impairments noted. Required increased time anc cues during ADLs, mobility, and answering therpist questions   Blood pressure (!) 144/65, pulse 79, temperature 98.4 F (36.9 C), temperature source Axillary, resp. rate (!) 22, height 5\' 8"  (1.727 m), weight 80.2 kg (176 lb 12.9 oz), SpO2 93 %. Physical Exam  Nursing note and vitals reviewed. Constitutional: He is oriented to person, place, and time. He appears well-developed and well-nourished. He appears lethargic. He is easily aroused. No distress.  Lying in bed with head turned to the left.   HENT:  Head: Normocephalic and atraumatic.  Mouth/Throat: Oropharynx is clear and moist.  Eyes: Pupils are equal, round, and reactive to light. Left eye exhibits exudate. Left conjunctiva is injected.  Neck: Normal range of motion. Neck supple.  Cardiovascular: Normal rate and regular rhythm.  Murmur heard. Respiratory: Effort normal. No stridor. He has rhonchi.  GI: Soft. Bowel sounds are normal. He exhibits no distension. There  is no tenderness.  Musculoskeletal: He exhibits tenderness. He exhibits no edema.  Neurological: He is oriented to person, place, and time and easily aroused. He appears lethargic.  Left facial droop with significant ptosis. Moderate dysarthria with occasional cough due to difficulty handling oral secretions. Sleepy but aroused without difficulty. Delayed processing but he was able to follow simple motor commands without difficulty. Dense left hemiparesis with extensor tone.     Skin:  Skin is warm and dry. He is not diaphoretic.  Psychiatric: His affect is blunt. His speech is delayed and slurred. He is slowed.    LabResultsLast24Hours       Results for orders placed or performed during the hospital encounter of 07/11/17 (from the past 24 hour(s))  CBC     Status: Abnormal   Collection Time: 07/13/17  3:50 AM  Result Value Ref Range   WBC 9.5 4.0 - 10.5 K/uL   RBC 4.29 4.22 - 5.81 MIL/uL   Hemoglobin 12.8 (L) 13.0 - 17.0 g/dL   HCT 39.3 39.0 - 52.0 %   MCV 91.6 78.0 - 100.0 fL   MCH 29.8 26.0 - 34.0 pg   MCHC 32.6 30.0 - 36.0 g/dL   RDW 13.8 11.5 - 15.5 %   Platelets 195 150 - 400 K/uL  Basic metabolic panel     Status: Abnormal   Collection Time: 07/13/17  3:50 AM  Result Value Ref Range   Sodium 140 135 - 145 mmol/L   Potassium 3.4 (L) 3.5 - 5.1 mmol/L   Chloride 110 101 - 111 mmol/L   CO2 20 (L) 22 - 32 mmol/L   Glucose, Bld 107 (H) 65 - 99 mg/dL   BUN 15 6 - 20 mg/dL   Creatinine, Ser 1.10 0.61 - 1.24 mg/dL   Calcium 8.5 (L) 8.9 - 10.3 mg/dL   GFR calc non Af Amer 59 (L) >60 mL/min   GFR calc Af Amer >60 >60 mL/min   Anion gap 10 5 - 15  Hemoglobin A1c     Status: None   Collection Time: 07/13/17  3:50 AM  Result Value Ref Range   Hgb A1c MFr Bld 5.6 4.8 - 5.6 %   Mean Plasma Glucose 114.02 mg/dL      ImagingResults(Last48hours)  Ct Angio Head W Or Wo Contrast  Result Date: 07/11/2017 CLINICAL DATA:  84 y/o  M; code stroke with left-sided deficits. EXAM: CT ANGIOGRAPHY HEAD AND NECK TECHNIQUE: Multidetector CT imaging of the head and neck was performed using the standard protocol during bolus administration of intravenous contrast. Multiplanar CT image reconstructions and MIPs were obtained to evaluate the vascular anatomy. Carotid stenosis measurements (when applicable) are obtained utilizing NASCET criteria, using the distal internal carotid diameter as the denominator. CONTRAST:  65mL ISOVUE-370 IOPAMIDOL  (ISOVUE-370) INJECTION 76% COMPARISON:  07/11/2016 CT head.  05/15/2011 MRA head. FINDINGS: CTA NECK FINDINGS Aortic arch: Standard branching. Imaged portion shows no evidence of aneurysm or dissection. No significant stenosis of the major arch vessel origins. Moderate calcific atherosclerosis. Status post LIMA and saphenous CABG grafts. Right carotid system: No evidence of dissection, stenosis (50% or greater) or occlusion. Dense calcified plaque of the carotid bifurcation with mild less than 50% distal common carotid and internal carotid artery stenosis. Left carotid system: No evidence of dissection, stenosis (50% or greater) or occlusion. Dense calcified plaque of carotid bifurcation with mild less than 50% proximal ICA stenosis. Calcified plaque of left mid common carotid artery with minimal less than 30% stenosis. Vertebral  arteries: Codominant. No evidence of dissection, stenosis (50% or greater) or occlusion. Right V1 calcified plaque with mild less than 50% stenosis. Skeleton: Severe cervical spondylosis with extensive discogenic degenerative changes greatest at the C5-C7 levels and severe facet hypertrophy. Reversal of cervical curvature with apex at C5. Multiple levels of canal stenosis greatest at the C5-6 level where it is moderate to severe. Uncovertebral and facet hypertrophy narrows the neural foramen greatest at the bilateral C5-6 and C6-7 levels. Other neck: Negative. Upper chest: Negative. Review of the MIP images confirms the above findings CTA HEAD FINDINGS Anterior circulation: Right M2 superior division proximal occlusion (series 7, image 90). Poor collateralization in the right M2 superior division distribution. Left distal M1 short segment of moderate stenosis. Bilateral carotid siphon calcific plaque with moderate bilateral paraclinoid stenosis. Posterior circulation: New focal calcification within the right P4 parietooccipital branch origin (series 8, image 116) with normal downstream  opacification of the artery, possibly atherosclerotic or nonocclusive calcified plaque thrombus. Bilateral P1 irregularity with mild stenosis. Venous sinuses: As permitted by contrast timing, patent. Anatomic variants: Anterior communicating artery. No posterior communicating artery identified, likely hypoplastic or absent. Delayed phase: No abnormal intracranial enhancement. Review of the MIP images confirms the above findings IMPRESSION: CTA neck: 1. No dissection, aneurysm, or high-grade stenosis by NASCET criteria. 2. Calcified plaque of aorta and carotid bifurcations with mild less than 50% proximal ICA stenosis bilaterally. 3. Severe cervical spondylosis greatest at C5 and C6 levels. At least moderate C5-6 canal stenosis. CTA head: 1. Right M2 superior division proximal occlusion with poor downstream collateralization. 2. New calcification within right P 4 parieto-occipital origin, possibly atherosclerotic or nonocclusive calcified plaque thrombus. 3. Intracranial atherosclerosis with multiple areas of mild-to-moderate stenosis. No high-grade stenosis, aneurysm, or additional large vessel occlusion. These results were communicated to Dr. Lorraine Lax at [place] 1/28/2019by text page via the [facility] messaging system. Electronically Signed   By: Kristine Garbe M.D.   On: 07/11/2017 19:05   Ct Head Wo Contrast  Result Date: 07/12/2017 CLINICAL DATA:  24 hour follow-up status post tPA. EXAM: CT HEAD WITHOUT CONTRAST TECHNIQUE: Contiguous axial images were obtained from the base of the skull through the vertex without intravenous contrast. COMPARISON:  Previous MRI from earlier the same day. FINDINGS: Brain: Patchy multifocal ischemic infarcts involving the right MCA and ACA territories as well as the watershed distribution again see, stable from previous MRI. No evidence for hemorrhagic transformation or other complication status post tPA. Otherwise stable appearance of the brain. Small remote left  frontal infarct again noted. Remote left cerebellar infarct. Stable atrophy with chronic small vessel ischemic disease. No other acute intracranial hemorrhage or large vessel territory infarct. No mass lesion, midline shift or mass effect. No hydrocephalus. No extra-axial fluid collection. Vascular: No hyperdense vessel. Scattered vascular calcifications noted within the carotid siphons. Skull: Scalp soft tissues and calvarium within normal limits. Sinuses/Orbits: Globes and orbital soft tissues within normal limits. Air-fluid levels noted within the sphenoid and maxillary sinuses. Scattered mucosal thickening within the ethmoidal sinuses. Mastoids are clear. Other: None. IMPRESSION: 1. Continued interval evolution right MCA and ACA territory infarcts, stable in distribution as compared to recent MRI. No evidence for hemorrhagic transformation or other complication identified. 2. Otherwise stable appearance of the brain. No other acute intracranial abnormality. 3. Acute maxillary and sphenoid sinusitis. Electronically Signed   By: Jeannine Boga M.D.   On: 07/12/2017 19:27   Ct Head Wo Contrast  Result Date: 07/11/2017 CLINICAL DATA:  84 y/o M;  post intra-arterial intervention for stroke. EXAM: CT HEAD WITHOUT CONTRAST TECHNIQUE: Contiguous axial images were obtained from the base of the skull through the vertex without intravenous contrast. COMPARISON:  07/11/2017 CT head and CT angiogram head. FINDINGS: Brain: Interval loss of gray-white differentiation in the right perisylvian frontal and parietal lobes (series 3, image 26 and series 6, image 21) likely representing developing acute infarction. No hemorrhage. Stable small chronic cortical infarcts in the frontal and right parietal lobes, small chronic infarct in left inferior cerebellar hemisphere, mild chronic microvascular ischemic changes, and parenchymal volume loss of the brain. Vascular: Contrast is present within the vascular structures. Skull:  Normal. Negative for fracture or focal lesion. Sinuses/Orbits: Persistent partial opacification of left anterior ethmoid and frontal sinuses. Fluid level in the right maxillary sinus. Normal aeration of mastoid air cells. Other: None. IMPRESSION: 1. Interval loss of gray-white differentiation in the right perisylvian frontal and parietal lobes likely representing a developing acute infarction. No hemorrhage. 2. Stable background of chronic microvascular ischemic changes and parenchymal volume loss of the brain. Electronically Signed   By: Kristine Garbe M.D.   On: 07/11/2017 21:50   Ct Angio Neck W And/or Wo Contrast  Result Date: 07/11/2017 CLINICAL DATA:  84 y/o  M; code stroke with left-sided deficits. EXAM: CT ANGIOGRAPHY HEAD AND NECK TECHNIQUE: Multidetector CT imaging of the head and neck was performed using the standard protocol during bolus administration of intravenous contrast. Multiplanar CT image reconstructions and MIPs were obtained to evaluate the vascular anatomy. Carotid stenosis measurements (when applicable) are obtained utilizing NASCET criteria, using the distal internal carotid diameter as the denominator. CONTRAST:  40mL ISOVUE-370 IOPAMIDOL (ISOVUE-370) INJECTION 76% COMPARISON:  07/11/2016 CT head.  05/15/2011 MRA head. FINDINGS: CTA NECK FINDINGS Aortic arch: Standard branching. Imaged portion shows no evidence of aneurysm or dissection. No significant stenosis of the major arch vessel origins. Moderate calcific atherosclerosis. Status post LIMA and saphenous CABG grafts. Right carotid system: No evidence of dissection, stenosis (50% or greater) or occlusion. Dense calcified plaque of the carotid bifurcation with mild less than 50% distal common carotid and internal carotid artery stenosis. Left carotid system: No evidence of dissection, stenosis (50% or greater) or occlusion. Dense calcified plaque of carotid bifurcation with mild less than 50% proximal ICA stenosis.  Calcified plaque of left mid common carotid artery with minimal less than 30% stenosis. Vertebral arteries: Codominant. No evidence of dissection, stenosis (50% or greater) or occlusion. Right V1 calcified plaque with mild less than 50% stenosis. Skeleton: Severe cervical spondylosis with extensive discogenic degenerative changes greatest at the C5-C7 levels and severe facet hypertrophy. Reversal of cervical curvature with apex at C5. Multiple levels of canal stenosis greatest at the C5-6 level where it is moderate to severe. Uncovertebral and facet hypertrophy narrows the neural foramen greatest at the bilateral C5-6 and C6-7 levels. Other neck: Negative. Upper chest: Negative. Review of the MIP images confirms the above findings CTA HEAD FINDINGS Anterior circulation: Right M2 superior division proximal occlusion (series 7, image 90). Poor collateralization in the right M2 superior division distribution. Left distal M1 short segment of moderate stenosis. Bilateral carotid siphon calcific plaque with moderate bilateral paraclinoid stenosis. Posterior circulation: New focal calcification within the right P4 parietooccipital branch origin (series 8, image 116) with normal downstream opacification of the artery, possibly atherosclerotic or nonocclusive calcified plaque thrombus. Bilateral P1 irregularity with mild stenosis. Venous sinuses: As permitted by contrast timing, patent. Anatomic variants: Anterior communicating artery. No posterior communicating artery identified, likely  hypoplastic or absent. Delayed phase: No abnormal intracranial enhancement. Review of the MIP images confirms the above findings IMPRESSION: CTA neck: 1. No dissection, aneurysm, or high-grade stenosis by NASCET criteria. 2. Calcified plaque of aorta and carotid bifurcations with mild less than 50% proximal ICA stenosis bilaterally. 3. Severe cervical spondylosis greatest at C5 and C6 levels. At least moderate C5-6 canal stenosis. CTA head:  1. Right M2 superior division proximal occlusion with poor downstream collateralization. 2. New calcification within right P 4 parieto-occipital origin, possibly atherosclerotic or nonocclusive calcified plaque thrombus. 3. Intracranial atherosclerosis with multiple areas of mild-to-moderate stenosis. No high-grade stenosis, aneurysm, or additional large vessel occlusion. These results were communicated to Dr. Lorraine Lax at [place] 1/28/2019by text page via the [facility] messaging system. Electronically Signed   By: Kristine Garbe M.D.   On: 07/11/2017 19:05   Mr Jodene Nam Head Wo Contrast  Result Date: 07/12/2017 CLINICAL DATA:  LEFT-sided weakness, RIGHT gaze deficit. Status post tPA and complete revascularization of RIGHT ACA occlusion. EXAM: MRI HEAD WITHOUT CONTRAST MRA HEAD WITHOUT CONTRAST TECHNIQUE: Multiplanar, multiecho pulse sequences of the brain and surrounding structures were obtained without intravenous contrast. Angiographic images of the head were obtained using MRA technique without contrast. COMPARISON:  CT HEAD and CTA HEAD and neck July 11, 2016. MRI of the head May 15, 2011 FINDINGS: MRI HEAD FINDINGS INTRACRANIAL CONTENTS: Confluent RIGHT frontoparietal reduced diffusion including medial cortex with low ADC values. Discontinuous RIGHT frontal and RIGHT basal ganglia reduced diffusion and low ADC values. Subcentimeter reduced diffusion and low ADC values RIGHT posterior temporal lobe. Faint susceptibility artifact RIGHT temporoparietal lobes compatible with petechial hemorrhage. Scattered chronic micro hemorrhages. LEFT cerebellar encephalomalacia. Small area RIGHT temporal occipital and LEFT frontal encephalomalacia. Old small RIGHT cerebellar, RIGHT pontine, RIGHT thalamus and RIGHT basal ganglia to corona radiata infarct. Prominent basal ganglia perivascular spaces associated with chronic small vessel ischemic disease. A few additional subcentimeter supratentorial white matter  FLAIR T2 hyperintensities compatible with mild chronic small vessel ischemic disease, less than expected for age. No advanced parenchymal brain volume loss for age. No midline shift, mass effect or masses. No abnormal extra-axial fluid collections. VASCULAR: Normal major intracranial vascular flow voids present at skull base. SKULL AND UPPER CERVICAL SPINE: No abnormal sellar expansion. No suspicious calvarial bone marrow signal. 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 Craniocervical junction maintained. SINUSES/ORBITS: Pan paranasal sinusitis with large RIGHT maxillary sinus air-fluid level. Trace RIGHT mastoid effusion. Included ocular globes and orbital contents are non-suspicious. OTHER: None. MRA HEAD FINDINGS ANTERIOR CIRCULATION: Normal flow related enhancement of the included cervical, petrous, cavernous and supraclinoid internal carotid arteries. Moderate stenosis bilateral para clinoid internal carotid arteries. Patent anterior communicating artery. Patent anterior and middle cerebral arteries, bilateral anterior cerebral arteries distal segments on included on MRA. No large vessel occlusion, flow limiting stenosis, aneurysm. POSTERIOR CIRCULATION: Codominant vertebral arteries. Patent vertebrobasilar junction and basilar artery. Main branch vessels not well demonstrated. Severe stenosis RIGHT P1 segment, moderate LEFT P2 stenosis. RIGHT P3 occlusion with thready reconstitution. No  aneurysm. ANATOMIC VARIANTS: None. Source images and MIP images were reviewed. IMPRESSION: MRI head: 1. Acute moderate RIGHT MCA and small RIGHT distal ACA infarcts. Small RIGHT posterior watershed territory infarct. Minimal petechial hemorrhage without hemorrhagic conversion. 2. 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 Multiple small vessel supra-and infratentorial old infarcts. MRA head: 1.  Successful revascularization of RIGHT middle cerebral artery occlusion. Distal anterior cerebral artery's  not included on typical MRA head. 2. Focally occluded RIGHT P3 segment (possibly overestimated by calcification and resultant artifact) with immediate reconstitution. Severe stenosis RIGHT P1 segment. Electronically Signed   By: Elon Alas M.D.   On: 07/12/2017 02:04   Mr Brain Wo Contrast  Result Date: 07/12/2017 CLINICAL DATA:  LEFT-sided weakness, RIGHT gaze deficit. Status post tPA and complete revascularization of RIGHT ACA occlusion. EXAM: MRI HEAD WITHOUT CONTRAST MRA HEAD WITHOUT CONTRAST TECHNIQUE: Multiplanar, multiecho pulse sequences of the brain and surrounding structures were obtained without intravenous contrast. Angiographic images of the head were obtained using MRA technique without contrast. COMPARISON:  CT HEAD and CTA HEAD and neck July 11, 2016. MRI of the head May 15, 2011 FINDINGS: MRI HEAD FINDINGS INTRACRANIAL CONTENTS: Confluent RIGHT frontoparietal reduced diffusion including medial cortex with low ADC values. Discontinuous RIGHT frontal and RIGHT basal ganglia reduced diffusion and low ADC values. Subcentimeter reduced diffusion and low ADC values RIGHT posterior temporal lobe. Faint susceptibility artifact RIGHT temporoparietal lobes compatible with petechial hemorrhage. Scattered chronic micro hemorrhages. LEFT cerebellar encephalomalacia. Small area RIGHT temporal occipital and LEFT frontal encephalomalacia. Old small RIGHT cerebellar, RIGHT pontine, RIGHT thalamus and RIGHT basal ganglia to corona radiata infarct. Prominent basal ganglia perivascular spaces associated with chronic small vessel ischemic disease. A few additional subcentimeter supratentorial white matter FLAIR T2 hyperintensities compatible with mild chronic small vessel ischemic disease, less than expected for age. No advanced parenchymal brain volume loss for age. No midline shift, mass effect  or masses. No abnormal extra-axial fluid collections. VASCULAR: Normal major intracranial vascular flow voids present at skull base. SKULL AND UPPER CERVICAL SPINE: No abnormal sellar expansion. No suspicious calvarial bone marrow signal. 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 Craniocervical junction maintained. SINUSES/ORBITS: Pan paranasal sinusitis with large RIGHT maxillary sinus air-fluid level. Trace RIGHT mastoid effusion. Included ocular globes and orbital contents are non-suspicious. OTHER: None. MRA HEAD FINDINGS ANTERIOR CIRCULATION: Normal flow related enhancement of the included cervical, petrous, cavernous and supraclinoid internal carotid arteries. Moderate stenosis bilateral para clinoid internal carotid arteries. Patent anterior communicating artery. Patent anterior and middle cerebral arteries, bilateral anterior cerebral arteries distal segments on included on MRA. No large vessel occlusion, flow limiting stenosis, aneurysm. POSTERIOR CIRCULATION: Codominant vertebral arteries. Patent vertebrobasilar junction and basilar artery. Main branch vessels not well demonstrated. Severe stenosis RIGHT P1 segment, moderate LEFT P2 stenosis. RIGHT P3 occlusion with thready reconstitution. No  aneurysm. ANATOMIC VARIANTS: None. Source images and MIP images were reviewed. IMPRESSION: MRI head: 1. Acute moderate RIGHT MCA and small RIGHT distal ACA infarcts. Small RIGHT posterior watershed territory infarct. Minimal petechial hemorrhage without hemorrhagic conversion. 2. 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 Multiple small vessel supra-and infratentorial old infarcts. MRA head: 1. Successful revascularization of RIGHT middle cerebral artery occlusion. Distal anterior cerebral artery's not included on typical MRA head. 2. Focally occluded RIGHT P3 segment (possibly overestimated by  calcification and resultant artifact) with immediate reconstitution. Severe stenosis RIGHT P1 segment. Electronically Signed   By: Elon Alas M.D.   On: 07/12/2017 02:04   Portable Chest Xray  Result Date: 07/12/2017 CLINICAL DATA:  84 year old male status post right MCA M2 vessel occlusion treated with tPA and revascularization. Intubated. EXAM: PORTABLE CHEST 1 VIEW COMPARISON:  07/11/2017 and earlier. FINDINGS: Portable AP semi upright view at 0617 hours. Stable endotracheal tube tip just below the clavicles. Stable lung volumes. Mediastinal contours remain within normal limits.  Calcified aortic atherosclerosis. Prior CABG. Streaky right mid and lower lung opacity with blunting of the right costophrenic angle appears chronic and not significantly changed compared to 2016. No pneumothorax, pulmonary edema or acute pulmonary opacity. IMPRESSION: 1. Stable endotracheal tube. 2. Chronic opacity in the right lower lung. No acute cardiopulmonary abnormality. Electronically Signed   By: Genevie Ann M.D.   On: 07/12/2017 08:40   Portable Chest X-ray  Result Date: 07/11/2017 CLINICAL DATA:  Respiratory failure EXAM: PORTABLE CHEST 1 VIEW COMPARISON:  Chest x-ray dated 01/13/2015. FINDINGS: Heart size and mediastinal contours are stable in size and configuration. Aortic atherosclerosis. Median sternotomy wires appear intact. Endotracheal tube is adequately positioned with tip approximately 5 cm above the carina. Chronic small pleural effusion and/or pleural thickening at the right lung base. No new lung findings. No pneumothorax seen. IMPRESSION: 1. No active disease.  No evidence of pneumonia or pulmonary edema. 2. Chronic small right pleural effusion and/or pleural thickening, stable. 3. Aortic atherosclerosis. Electronically Signed   By: Franki Cabot M.D.   On: 07/11/2017 22:27   Ct Head Code Stroke Wo Contrast  Result Date: 07/11/2017 CLINICAL DATA:  Code stroke. 84 y/o M; left-sided deficits and  right-sided gaze. EXAM: CT HEAD WITHOUT CONTRAST TECHNIQUE: Contiguous axial images were obtained from the base of the skull through the vertex without intravenous contrast. COMPARISON:  05/13/2011 CT head. FINDINGS: Brain: Stable small chronic cortical infarcts in left frontal and right parietal lobes. Stable small chronic lacunar infarct in the right caudate body and right thalamus. Chronic infarction in the left inferior cerebellar hemisphere. Background of mild chronic microvascular ischemic changes and parenchymal volume loss of the brain. Vascular: Calcific atherosclerosis of carotid siphons. Hyperdensity within right distal M1 (series 6, image 21). Skull: Normal. Negative for fracture or focal lesion. Sinuses/Orbits: Left anterior ethmoid and frontal sinus partial opacification in chronic inflammatory changes. Right intra-ocular lens replacement. Other: None. ASPECTS [facility] Stroke Program Early CT Score) - Ganglionic level infarction (caudate, lentiform nuclei, internal capsule, insula, M1-M3 cortex): 7 - Supraganglionic infarction (M4-M6 cortex): 3 Total score (0-10 with 10 being normal): 10 IMPRESSION: 1. No acute intracranial abnormality identified. 2. ASPECTS is 10 3. Dense distal right M1, suspected thrombus. 4. Stable chronic microvascular ischemic changes, parenchymal volume loss, and small cortical infarctions. These results were called by telephone at the time of interpretation on 07/11/2017 at 6:43 pm to Dr. Lorraine Lax, who verbally acknowledged these results. Electronically Signed   By: Kristine Garbe M.D.   On: 07/11/2017 18:43     Assessment/Plan: Diagnosis: RIght MCA infarct with left hemi, s/p successful revascularization 1. Does the need for close, 24 hr/day medical supervision in concert with the patient's rehab needs make it unreasonable for this patient to be served in a less intensive setting? Yes 2. Co-Morbidities requiring supervision/potential complications: atrial  fibrillation 3. Due to bladder management, bowel management, safety, skin/wound care, disease management and medication administration, does the patient require 24 hr/day rehab nursing? Yes 4. Does the patient require coordinated care of a physician, rehab nurse, PT (1-2 hrs/day, 5 days/week), OT (1-2 hrs/day, 5 days/week) and SLP (.5-1 hrs/day, 5 days/week) to address physical and functional deficits in the context of the above medical diagnosis(es)? Yes Addressing deficits in the following areas: balance, endurance, locomotion, strength, transferring, bowel/bladder control, bathing, dressing, feeding, grooming, toileting, cognition, swallowing and psychosocial support 5. Can the patient actively participate in an intensive therapy program of at least 3 hrs of therapy per day at least 5 days  per week? Yes 6. The potential for patient to make measurable gains while on inpatient rehab is fair 7. Anticipated functional outcomes upon discharge from inpatient rehab are mod assist  with PT, mod assist with OT, supervision with SLP. 8. Estimated rehab length of stay to reach the above functional goals is: 21-23d 9. Anticipated D/C setting: Home vs SNF dependng on caregiver 10. Anticipated post D/C treatments: [place] therapy 11. Overall Rehab/Functional Prognosis: fair  RECOMMENDATIONS: This patient's condition is appropriate for continued rehabilitative care in the following setting: CIR Patient has agreed to participate in recommended program. Yes Note that insurance prior authorization may be required for reimbursement for recommended care.  Comment: Given that goals are for moderate assistance ADL and mobility, need to make sure that patient has SNF coverage in case Family cannot provide this level of assistance  Charlett Blake M.D. [facility] FAAPM&R (Sports Med, Neuromuscular Med) Diplomate Am Board of [place], PA-C 07/13/2017

## 2017-07-15 NOTE — Progress Notes (Signed)
STROKE TEAM PROGRESS NOTE   SUBJECTIVE (INTERVAL HISTORY) No family is at the bedside. Pt is laying in bed in NAD. Left hemiplegia and moderate dysarthria with left facial droop unchanged. Much more alert and interactive with examiner today. Following all simple commands. Remain afebrile with mild leukocytosis. Levaquin in progress. CXR and repeat U/A with no acute findings.  OBJECTIVE Temp:  [98.2 F (36.8 C)-100.6 F (38.1 C)] 98.2 F (36.8 C) (02/01 1039) Pulse Rate:  [71-80] 80 (02/01 1039) Cardiac Rhythm: Normal sinus rhythm (02/01 0800) Resp:  [17-18] 17 (02/01 1039) BP: (135-152)/(63-74) 135/66 (02/01 1039) SpO2:  [96 %-99 %] 99 % (02/01 1039)  Recent Labs  Lab 07/11/17 1819  GLUCAP 90   Recent Labs  Lab 07/11/17 1820 07/11/17 1826 07/12/17 0359 07/13/17 0350 07/15/17 0351  NA 138 139 138 140 142  K 4.6 4.6 4.1 3.4* 4.4  CL 107 105 110 110 112*  CO2 21*  --  19* 20* 20*  GLUCOSE 111* 107* 138* 107* 113*  BUN 21* 21* 22* 15 19  CREATININE 1.29* 1.20 1.17 1.10 1.14  CALCIUM 9.2  --  8.3* 8.5* 8.9  MG  --   --   --   --  1.9  PHOS  --   --   --   --  1.8*   Recent Labs  Lab 07/11/17 1820  AST 25  ALT 14*  ALKPHOS 57  BILITOT 0.9  PROT 6.6  ALBUMIN 4.3   Recent Labs  Lab 07/11/17 1820 07/11/17 1826 07/12/17 0359 07/13/17 0350 07/15/17 0351  WBC 7.2  --  8.4 9.5 10.9*  NEUTROABS 4.7  --  7.1  --   --   HGB 14.4 15.0 12.7* 12.8* 12.6*  HCT 44.1 44.0 38.5* 39.3 39.3  MCV 90.9  --  90.8 91.6 93.1  PLT 219  --  196 195 187   No results for input(s): CKTOTAL, CKMB, CKMBINDEX, TROPONINI in the last 168 hours. No results for input(s): LABPROT, INR in the last 72 hours. Recent Labs    07/14/17 1618  COLORURINE YELLOW  LABSPEC 1.013  PHURINE 6.0  GLUCOSEU 50*  HGBUR MODERATE*  BILIRUBINUR NEGATIVE  KETONESUR 20*  PROTEINUR 30*  NITRITE NEGATIVE  LEUKOCYTESUR NEGATIVE       Component Value Date/Time   CHOL 122 07/12/2017 0359   TRIG 92  07/12/2017 0359   TRIG 93 07/12/2017 0359   HDL 38 (L) 07/12/2017 0359   CHOLHDL 3.2 07/12/2017 0359   VLDL 18 07/12/2017 0359   LDLCALC 66 07/12/2017 0359   Lab Results  Component Value Date   HGBA1C 5.6 07/13/2017      Component Value Date/Time   LABOPIA NONE DETECTED 07/12/2017 0420   COCAINSCRNUR NONE DETECTED 07/12/2017 0420   LABBENZ NONE DETECTED 07/12/2017 0420   AMPHETMU NONE DETECTED 07/12/2017 0420   THCU NONE DETECTED 07/12/2017 0420   LABBARB NONE DETECTED 07/12/2017 0420    Recent Labs  Lab 07/11/17 1820  ETH <10    I have personally reviewed the radiological images below and agree with the radiology interpretations.  Ct Angio Head W Or Wo Contrast  Result Date: 07/11/2017 CLINICAL DATA:  84 y/o  M; code stroke with left-sided deficits. EXAM: CT ANGIOGRAPHY HEAD AND NECK TECHNIQUE: Multidetector CT imaging of the head and neck was performed using the standard protocol during bolus administration of intravenous contrast. Multiplanar CT image reconstructions and MIPs were obtained to evaluate the vascular anatomy. Carotid stenosis measurements (when applicable)  are obtained utilizing NASCET criteria, using the distal internal carotid diameter as the denominator. CONTRAST:  43mL ISOVUE-370 IOPAMIDOL (ISOVUE-370) INJECTION 76% COMPARISON:  07/11/2016 CT head.  05/15/2011 MRA head. FINDINGS: CTA NECK FINDINGS Aortic arch: Standard branching. Imaged portion shows no evidence of aneurysm or dissection. No significant stenosis of the major arch vessel origins. Moderate calcific atherosclerosis. Status post LIMA and saphenous CABG grafts. Right carotid system: No evidence of dissection, stenosis (50% or greater) or occlusion. Dense calcified plaque of the carotid bifurcation with mild less than 50% distal common carotid and internal carotid artery stenosis. Left carotid system: No evidence of dissection, stenosis (50% or greater) or occlusion. Dense calcified plaque of carotid  bifurcation with mild less than 50% proximal ICA stenosis. Calcified plaque of left mid common carotid artery with minimal less than 30% stenosis. Vertebral arteries: Codominant. No evidence of dissection, stenosis (50% or greater) or occlusion. Right V1 calcified plaque with mild less than 50% stenosis. Skeleton: Severe cervical spondylosis with extensive discogenic degenerative changes greatest at the C5-C7 levels and severe facet hypertrophy. Reversal of cervical curvature with apex at C5. Multiple levels of canal stenosis greatest at the C5-6 level where it is moderate to severe. Uncovertebral and facet hypertrophy narrows the neural foramen greatest at the bilateral C5-6 and C6-7 levels. Other neck: Negative. Upper chest: Negative. Review of the MIP images confirms the above findings CTA HEAD FINDINGS Anterior circulation: Right M2 superior division proximal occlusion (series 7, image 90). Poor collateralization in the right M2 superior division distribution. Left distal M1 short segment of moderate stenosis. Bilateral carotid siphon calcific plaque with moderate bilateral paraclinoid stenosis. Posterior circulation: New focal calcification within the right P4 parietooccipital branch origin (series 8, image 116) with normal downstream opacification of the artery, possibly atherosclerotic or nonocclusive calcified plaque thrombus. Bilateral P1 irregularity with mild stenosis. Venous sinuses: As permitted by contrast timing, patent. Anatomic variants: Anterior communicating artery. No posterior communicating artery identified, likely hypoplastic or absent. Delayed phase: No abnormal intracranial enhancement. Review of the MIP images confirms the above findings IMPRESSION: CTA neck: 1. No dissection, aneurysm, or high-grade stenosis by NASCET criteria. 2. Calcified plaque of aorta and carotid bifurcations with mild less than 50% proximal ICA stenosis bilaterally. 3. Severe cervical spondylosis greatest at C5 and  C6 levels. At least moderate C5-6 canal stenosis. CTA head: 1. Right M2 superior division proximal occlusion with poor downstream collateralization. 2. New calcification within right P 4 parieto-occipital origin, possibly atherosclerotic or nonocclusive calcified plaque thrombus. 3. Intracranial atherosclerosis with multiple areas of mild-to-moderate stenosis. No high-grade stenosis, aneurysm, or additional large vessel occlusion. These results were communicated to Dr. Lorraine Lax at [place] 1/28/2019by text page via the [facility] messaging system. Electronically Signed   By: Kristine Garbe M.D.   On: 07/11/2017 19:05   Ct Head Wo Contrast  Result Date: 07/12/2017 CLINICAL DATA:  24 hour follow-up status post tPA. EXAM: CT HEAD WITHOUT CONTRAST TECHNIQUE: Contiguous axial images were obtained from the base of the skull through the vertex without intravenous contrast. COMPARISON:  Previous MRI from earlier the same day. FINDINGS: Brain: Patchy multifocal ischemic infarcts involving the right MCA and ACA territories as well as the watershed distribution again see, stable from previous MRI. No evidence for hemorrhagic transformation or other complication status post tPA. Otherwise stable appearance of the brain. Small remote left frontal infarct again noted. Remote left cerebellar infarct. Stable atrophy with chronic small vessel ischemic disease. No other acute intracranial hemorrhage or large vessel territory  infarct. No mass lesion, midline shift or mass effect. No hydrocephalus. No extra-axial fluid collection. Vascular: No hyperdense vessel. Scattered vascular calcifications noted within the carotid siphons. Skull: Scalp soft tissues and calvarium within normal limits. Sinuses/Orbits: Globes and orbital soft tissues within normal limits. Air-fluid levels noted within the sphenoid and maxillary sinuses. Scattered mucosal thickening within the ethmoidal sinuses. Mastoids are clear. Other: None. IMPRESSION: 1.  Continued interval evolution right MCA and ACA territory infarcts, stable in distribution as compared to recent MRI. No evidence for hemorrhagic transformation or other complication identified. 2. Otherwise stable appearance of the brain. No other acute intracranial abnormality. 3. Acute maxillary and sphenoid sinusitis. Electronically Signed   By: Jeannine Boga M.D.   On: 07/12/2017 19:27   Ct Head Wo Contrast  Result Date: 07/11/2017 CLINICAL DATA:  84 y/o M; post intra-arterial intervention for stroke. EXAM: CT HEAD WITHOUT CONTRAST TECHNIQUE: Contiguous axial images were obtained from the base of the skull through the vertex without intravenous contrast. COMPARISON:  07/11/2017 CT head and CT angiogram head. FINDINGS: Brain: Interval loss of gray-white differentiation in the right perisylvian frontal and parietal lobes (series 3, image 26 and series 6, image 21) likely representing developing acute infarction. No hemorrhage. Stable small chronic cortical infarcts in the frontal and right parietal lobes, small chronic infarct in left inferior cerebellar hemisphere, mild chronic microvascular ischemic changes, and parenchymal volume loss of the brain. Vascular: Contrast is present within the vascular structures. Skull: Normal. Negative for fracture or focal lesion. Sinuses/Orbits: Persistent partial opacification of left anterior ethmoid and frontal sinuses. Fluid level in the right maxillary sinus. Normal aeration of mastoid air cells. Other: None. IMPRESSION: 1. Interval loss of gray-white differentiation in the right perisylvian frontal and parietal lobes likely representing a developing acute infarction. No hemorrhage. 2. Stable background of chronic microvascular ischemic changes and parenchymal volume loss of the brain. Electronically Signed   By: Kristine Garbe M.D.   On: 07/11/2017 21:50   Ct Angio Neck W And/or Wo Contrast  Result Date: 07/11/2017 CLINICAL DATA:  84 y/o  M; code  stroke with left-sided deficits. EXAM: CT ANGIOGRAPHY HEAD AND NECK TECHNIQUE: Multidetector CT imaging of the head and neck was performed using the standard protocol during bolus administration of intravenous contrast. Multiplanar CT image reconstructions and MIPs were obtained to evaluate the vascular anatomy. Carotid stenosis measurements (when applicable) are obtained utilizing NASCET criteria, using the distal internal carotid diameter as the denominator. CONTRAST:  24mL ISOVUE-370 IOPAMIDOL (ISOVUE-370) INJECTION 76% COMPARISON:  07/11/2016 CT head.  05/15/2011 MRA head. FINDINGS: CTA NECK FINDINGS Aortic arch: Standard branching. Imaged portion shows no evidence of aneurysm or dissection. No significant stenosis of the major arch vessel origins. Moderate calcific atherosclerosis. Status post LIMA and saphenous CABG grafts. Right carotid system: No evidence of dissection, stenosis (50% or greater) or occlusion. Dense calcified plaque of the carotid bifurcation with mild less than 50% distal common carotid and internal carotid artery stenosis. Left carotid system: No evidence of dissection, stenosis (50% or greater) or occlusion. Dense calcified plaque of carotid bifurcation with mild less than 50% proximal ICA stenosis. Calcified plaque of left mid common carotid artery with minimal less than 30% stenosis. Vertebral arteries: Codominant. No evidence of dissection, stenosis (50% or greater) or occlusion. Right V1 calcified plaque with mild less than 50% stenosis. Skeleton: Severe cervical spondylosis with extensive discogenic degenerative changes greatest at the C5-C7 levels and severe facet hypertrophy. Reversal of cervical curvature with apex at C5. Multiple levels  of canal stenosis greatest at the C5-6 level where it is moderate to severe. Uncovertebral and facet hypertrophy narrows the neural foramen greatest at the bilateral C5-6 and C6-7 levels. Other neck: Negative. Upper chest: Negative. Review of the  MIP images confirms the above findings CTA HEAD FINDINGS Anterior circulation: Right M2 superior division proximal occlusion (series 7, image 90). Poor collateralization in the right M2 superior division distribution. Left distal M1 short segment of moderate stenosis. Bilateral carotid siphon calcific plaque with moderate bilateral paraclinoid stenosis. Posterior circulation: New focal calcification within the right P4 parietooccipital branch origin (series 8, image 116) with normal downstream opacification of the artery, possibly atherosclerotic or nonocclusive calcified plaque thrombus. Bilateral P1 irregularity with mild stenosis. Venous sinuses: As permitted by contrast timing, patent. Anatomic variants: Anterior communicating artery. No posterior communicating artery identified, likely hypoplastic or absent. Delayed phase: No abnormal intracranial enhancement. Review of the MIP images confirms the above findings IMPRESSION: CTA neck: 1. No dissection, aneurysm, or high-grade stenosis by NASCET criteria. 2. Calcified plaque of aorta and carotid bifurcations with mild less than 50% proximal ICA stenosis bilaterally. 3. Severe cervical spondylosis greatest at C5 and C6 levels. At least moderate C5-6 canal stenosis. CTA head: 1. Right M2 superior division proximal occlusion with poor downstream collateralization. 2. New calcification within right P 4 parieto-occipital origin, possibly atherosclerotic or nonocclusive calcified plaque thrombus. 3. Intracranial atherosclerosis with multiple areas of mild-to-moderate stenosis. No high-grade stenosis, aneurysm, or additional large vessel occlusion. These results were communicated to Dr. Lorraine Lax at [place] 1/28/2019by text page via the [facility] messaging system. Electronically Signed   By: Kristine Garbe M.D.   On: 07/11/2017 19:05   Mr Jodene Nam Head Wo Contrast  Result Date: 07/12/2017 CLINICAL DATA:  LEFT-sided weakness, RIGHT gaze deficit. Status post tPA and  complete revascularization of RIGHT ACA occlusion. EXAM: MRI HEAD WITHOUT CONTRAST MRA HEAD WITHOUT CONTRAST TECHNIQUE: Multiplanar, multiecho pulse sequences of the brain and surrounding structures were obtained without intravenous contrast. Angiographic images of the head were obtained using MRA technique without contrast. COMPARISON:  CT HEAD and CTA HEAD and neck July 11, 2016. MRI of the head May 15, 2011 FINDINGS: MRI HEAD FINDINGS INTRACRANIAL CONTENTS: Confluent RIGHT frontoparietal reduced diffusion including medial cortex with low ADC values. Discontinuous RIGHT frontal and RIGHT basal ganglia reduced diffusion and low ADC values. Subcentimeter reduced diffusion and low ADC values RIGHT posterior temporal lobe. Faint susceptibility artifact RIGHT temporoparietal lobes compatible with petechial hemorrhage. Scattered chronic micro hemorrhages. LEFT cerebellar encephalomalacia. Small area RIGHT temporal occipital and LEFT frontal encephalomalacia. Old small RIGHT cerebellar, RIGHT pontine, RIGHT thalamus and RIGHT basal ganglia to corona radiata infarct. Prominent basal ganglia perivascular spaces associated with chronic small vessel ischemic disease. A few additional subcentimeter supratentorial white matter FLAIR T2 hyperintensities compatible with mild chronic small vessel ischemic disease, less than expected for age. No advanced parenchymal brain volume loss for age. No midline shift, mass effect or masses. No abnormal extra-axial fluid collections. VASCULAR: Normal major intracranial vascular flow voids present at skull base. SKULL AND UPPER CERVICAL SPINE: No abnormal sellar expansion. No suspicious calvarial bone marrow signal. 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 Craniocervical junction maintained. SINUSES/ORBITS: Pan paranasal sinusitis with large RIGHT maxillary sinus air-fluid level. Trace RIGHT mastoid effusion. Included ocular globes and orbital contents  are non-suspicious. OTHER: None. MRA HEAD FINDINGS ANTERIOR CIRCULATION: Normal flow related enhancement of the included cervical, petrous, cavernous and supraclinoid internal carotid arteries. Moderate stenosis bilateral para  clinoid internal carotid arteries. Patent anterior communicating artery. Patent anterior and middle cerebral arteries, bilateral anterior cerebral arteries distal segments on included on MRA. No large vessel occlusion, flow limiting stenosis, aneurysm. POSTERIOR CIRCULATION: Codominant vertebral arteries. Patent vertebrobasilar junction and basilar artery. Main branch vessels not well demonstrated. Severe stenosis RIGHT P1 segment, moderate LEFT P2 stenosis. RIGHT P3 occlusion with thready reconstitution. No  aneurysm. ANATOMIC VARIANTS: None. Source images and MIP images were reviewed. IMPRESSION: MRI head: 1. Acute moderate RIGHT MCA and small RIGHT distal ACA infarcts. Small RIGHT posterior watershed territory infarct. Minimal petechial hemorrhage without hemorrhagic conversion. 2. 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 Multiple small vessel supra-and infratentorial old infarcts. MRA head: 1. Successful revascularization of RIGHT middle cerebral artery occlusion. Distal anterior cerebral artery's not included on typical MRA head. 2. Focally occluded RIGHT P3 segment (possibly overestimated by calcification and resultant artifact) with immediate reconstitution. Severe stenosis RIGHT P1 segment. Electronically Signed   By: Elon Alas M.D.   On: 07/12/2017 02:04   Mr Brain Wo Contrast  Result Date: 07/12/2017 CLINICAL DATA:  LEFT-sided weakness, RIGHT gaze deficit. Status post tPA and complete revascularization of RIGHT ACA occlusion. EXAM: MRI HEAD WITHOUT CONTRAST MRA HEAD WITHOUT CONTRAST TECHNIQUE: Multiplanar, multiecho pulse sequences of the brain and surrounding structures were  obtained without intravenous contrast. Angiographic images of the head were obtained using MRA technique without contrast. COMPARISON:  CT HEAD and CTA HEAD and neck July 11, 2016. MRI of the head May 15, 2011 FINDINGS: MRI HEAD FINDINGS INTRACRANIAL CONTENTS: Confluent RIGHT frontoparietal reduced diffusion including medial cortex with low ADC values. Discontinuous RIGHT frontal and RIGHT basal ganglia reduced diffusion and low ADC values. Subcentimeter reduced diffusion and low ADC values RIGHT posterior temporal lobe. Faint susceptibility artifact RIGHT temporoparietal lobes compatible with petechial hemorrhage. Scattered chronic micro hemorrhages. LEFT cerebellar encephalomalacia. Small area RIGHT temporal occipital and LEFT frontal encephalomalacia. Old small RIGHT cerebellar, RIGHT pontine, RIGHT thalamus and RIGHT basal ganglia to corona radiata infarct. Prominent basal ganglia perivascular spaces associated with chronic small vessel ischemic disease. A few additional subcentimeter supratentorial white matter FLAIR T2 hyperintensities compatible with mild chronic small vessel ischemic disease, less than expected for age. No advanced parenchymal brain volume loss for age. No midline shift, mass effect or masses. No abnormal extra-axial fluid collections. VASCULAR: Normal major intracranial vascular flow voids present at skull base. SKULL AND UPPER CERVICAL SPINE: No abnormal sellar expansion. No suspicious calvarial bone marrow signal. 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 Craniocervical junction maintained. SINUSES/ORBITS: Pan paranasal sinusitis with large RIGHT maxillary sinus air-fluid level. Trace RIGHT mastoid effusion. Included ocular globes and orbital contents are non-suspicious. OTHER: None. MRA HEAD FINDINGS ANTERIOR CIRCULATION: Normal flow related enhancement of the included cervical, petrous, cavernous and supraclinoid internal carotid arteries. Moderate  stenosis bilateral para clinoid internal carotid arteries. Patent anterior communicating artery. Patent anterior and middle cerebral arteries, bilateral anterior cerebral arteries distal segments on included on MRA. No large vessel occlusion, flow limiting stenosis, aneurysm. POSTERIOR CIRCULATION: Codominant vertebral arteries. Patent vertebrobasilar junction and basilar artery. Main branch vessels not well demonstrated. Severe stenosis RIGHT P1 segment, moderate LEFT P2 stenosis. RIGHT P3 occlusion with thready reconstitution. No  aneurysm. ANATOMIC VARIANTS: None. Source images and MIP images were reviewed. IMPRESSION: MRI head: 1. Acute moderate RIGHT MCA and small RIGHT distal ACA infarcts. Small RIGHT posterior watershed territory infarct. Minimal petechial hemorrhage without hemorrhagic conversion. 2.  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 Multiple small vessel supra-and infratentorial old infarcts. MRA head: 1. Successful revascularization of RIGHT middle cerebral artery occlusion. Distal anterior cerebral artery's not included on typical MRA head. 2. Focally occluded RIGHT P3 segment (possibly overestimated by calcification and resultant artifact) with immediate reconstitution. Severe stenosis RIGHT P1 segment. Electronically Signed   By: Elon Alas M.D.   On: 07/12/2017 02:04   Portable Chest Xray  Result Date: 07/12/2017 CLINICAL DATA:  84 year old male status post right MCA M2 vessel occlusion treated with tPA and revascularization. Intubated. EXAM: PORTABLE CHEST 1 VIEW COMPARISON:  07/11/2017 and earlier. FINDINGS: Portable AP semi upright view at 0617 hours. Stable endotracheal tube tip just below the clavicles. Stable lung volumes. Mediastinal contours remain within normal limits. Calcified aortic atherosclerosis. Prior CABG. Streaky right mid and lower lung opacity with blunting of the right  costophrenic angle appears chronic and not significantly changed compared to 2016. No pneumothorax, pulmonary edema or acute pulmonary opacity. IMPRESSION: 1. Stable endotracheal tube. 2. Chronic opacity in the right lower lung. No acute cardiopulmonary abnormality. Electronically Signed   By: Genevie Ann M.D.   On: 07/12/2017 08:40   Portable Chest X-ray  Result Date: 07/11/2017 CLINICAL DATA:  Respiratory failure EXAM: PORTABLE CHEST 1 VIEW COMPARISON:  Chest x-ray dated 01/13/2015. FINDINGS: Heart size and mediastinal contours are stable in size and configuration. Aortic atherosclerosis. Median sternotomy wires appear intact. Endotracheal tube is adequately positioned with tip approximately 5 cm above the carina. Chronic small pleural effusion and/or pleural thickening at the right lung base. No new lung findings. No pneumothorax seen. IMPRESSION: 1. No active disease.  No evidence of pneumonia or pulmonary edema. 2. Chronic small right pleural effusion and/or pleural thickening, stable. 3. Aortic atherosclerosis. Electronically Signed   By: Franki Cabot M.D.   On: 07/11/2017 22:27   Ct Head Code Stroke Wo Contrast  Result Date: 07/11/2017 CLINICAL DATA:  Code stroke. 84 y/o M; left-sided deficits and right-sided gaze. EXAM: CT HEAD WITHOUT CONTRAST TECHNIQUE: Contiguous axial images were obtained from the base of the skull through the vertex without intravenous contrast. COMPARISON:  05/13/2011 CT head. FINDINGS: Brain: Stable small chronic cortical infarcts in left frontal and right parietal lobes. Stable small chronic lacunar infarct in the right caudate body and right thalamus. Chronic infarction in the left inferior cerebellar hemisphere. Background of mild chronic microvascular ischemic changes and parenchymal volume loss of the brain. Vascular: Calcific atherosclerosis of carotid siphons. Hyperdensity within right distal M1 (series 6, image 21). Skull: Normal. Negative for fracture or focal lesion.  Sinuses/Orbits: Left anterior ethmoid and frontal sinus partial opacification in chronic inflammatory changes. Right intra-ocular lens replacement. Other: None. ASPECTS [facility] Stroke Program Early CT Score) - Ganglionic level infarction (caudate, lentiform nuclei, internal capsule, insula, M1-M3 cortex): 7 - Supraganglionic infarction (M4-M6 cortex): 3 Total score (0-10 with 10 being normal): 10 IMPRESSION: 1. No acute intracranial abnormality identified. 2. ASPECTS is 10 3. Dense distal right M1, suspected thrombus. 4. Stable chronic microvascular ischemic changes, parenchymal volume loss, and small cortical infarctions. These results were called by telephone at the time of interpretation on 07/11/2017 at 6:43 pm to Dr. Lorraine Lax, who verbally acknowledged these results. Electronically Signed   By: Kristine Garbe M.D.   On: 07/11/2017 18:43   TTE  - Left ventricle: The cavity size was normal. Systolic function was   normal. The estimated ejection fraction was in the range  of 55%   to 60%. Wall motion was normal; there were no regional wall   motion abnormalities. Features are consistent with a pseudonormal   left ventricular filling pattern, with concomitant abnormal   relaxation and increased filling pressure (grade 2 diastolic   dysfunction). - Aortic valve: Transvalvular velocity was within the normal range.   There was no stenosis. There was no regurgitation. - Aorta: Ascending aortic diameter: 38 mm (S). - Ascending aorta: The ascending aorta was mildly dilated. - Mitral valve: Prior procedures included surgical repair.   Transvalvular velocity was within the normal range. There was no   evidence for stenosis. There was mild regurgitation. Valve area   by pressure half-time: 2.29 cm^2. - Left atrium: The atrium was moderately dilated. - Right ventricle: The cavity size was normal. Wall thickness was   normal. Systolic function was normal. - Atrial septum: No defect or patent foramen  ovale was identified. - Tricuspid valve: There was mild regurgitation. - Pulmonary arteries: Systolic pressure was severely increased. PA   peak pressure: 56 mm Hg (S).  PORTABLE CHEST 1 VIEW  07/13/2017 09:35 IMPRESSION: 1. Low volumes but stable inflation after extubation. 2. Atelectasis or bronchopneumonia at the bases.  PHYSICAL EXAM  Temp:  [98.2 F (36.8 C)-100.6 F (38.1 C)] 98.2 F (36.8 C) (02/01 1039) Pulse Rate:  [71-80] 80 (02/01 1039) Resp:  [17-18] 17 (02/01 1039) BP: (135-152)/(63-74) 135/66 (02/01 1039) SpO2:  [96 %-99 %] 99 % (02/01 1039)  General - Well nourished, well developed, not in acute distress.  Ophthalmologic - fundi not visualized due to noncooperation.  Cardiovascular - irregularly irregular heart rate and rhythm.  Neuro - awake alert, sitting in chair. Able to follow simple commands, moderate dysarthria, paucity of speech, able to name 3/4 and repeat simple sentences. Left eyelid mild ptosis with bloody secretions. PERRL, right gaze preference, barely cross midline to left. Blinking to visual threat on the right but not on the left. Facial symmetry difficult to exam due to ET tube. RUE and RLE 5/5, spontaneous purposeful movement. LUE and LLE hemiplegic. On pain stimulation, mild withdraw on the LLE. DTR 1+ and no babinski. Sensation, coordination and gait not tested.   ASSESSMENT/PLAN [name] is a 84 y.o. 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 TPA given    Stroke:  right MCA and ACA patchy scattered moderate to large infarct s/p IV and IA tPA, embolic secondary to afib on coumadin with subtherapeutic INR  Resultant left hemiplegia, right gaze preference  CTA head and neck - right M2 cut off, b/l ICA proximal 50% stenosis  DSA right M2 recanalized  after IV tPA, right distal ACA recanalized after IA tPA  MRI  Right MCA and ACA acute infarcts, old left large cerebellar infarct  MRA  Right MCA recanalized after IR  2D Echo EF 55-60%  LDL 66  HgbA1c 5.6  lovenox for VTE prophylaxis   warfarin daily prior to admission, now on ASA 325 mg.   Ongoing aggressive stroke risk factor management  Therapy recommendations:  CIR  Disposition:  CIR   07/14/2017: PT was seen in the afternoon, lethargic and sleeping with sign of OSA. However, as per wife, pt was more awake alert and worked with PT/OT well. Sitting in chair for long time and just had  bath, so was worn out. He had low grade fever intermittent today, UA and CXR unrevealing. Will put on empirical antibiotics. Discussed with Dr. Martinique and will re-start [facility] with eliquis in 5 days. Pending CIR.   07/15/2017: Much more alert and interactive today. Remained Afebrile overnight. Levaquin in progress for empirical treatment. Continues to participate in therapies. Awaiting CIR insurance approval.   Afib on coumadin  INR 1.48 on admission  Follows with Dr. Martinique cardiology  Hold off [facility] for now due to moderate to large right MCA and ACA infarcts  Rate under control  Will consider resume AC in 5-7 days  Will consider eliquis, discussed with Dr. Martinique who is in agreement.   History of stroke  05/2011 left large cerebellar infarct  Concerning for cardiac source  Put on DAPT at that time  recommended outpt TEE and long term heart monitoring at that time  Hypertension Stable  Long term BP goal normotensive  Hyperlipidemia  Home meds:  crestor   LDL 66, goal < 70  Resumed crestor 20  Continue statin at discharge  Other Stroke Risk Factors  Advanced age  Coronary artery disease s/p CABG  LAA thrombus S/p MAZE  Other Active Problems  Hypokalemia - Resolved. replacement in progress, repeat labs in AM  Hypophosphoremia - replacement in progress, repeat labs  in AM  Atelectasis vs Bronchopneumonia at bases - CXR- no acute findings  Fever - Resolved today, Mild leukocytosis. CXR-no acute findings,  U/A- Negative. Levaquin in progress.  Left eyelid ulcer - eye drops and eye care in progress  Hospital day # 4   Renie Ora Stroke Neurology Team 07/15/2017 2:17 PM  To contact Stroke Continuity provider, please refer to [email or web address] After hours, contact General Neurology

## 2017-07-16 ENCOUNTER — Inpatient Hospital Stay (HOSPITAL_COMMUNITY): Payer: Medicare Other | Admitting: Physical Therapy

## 2017-07-16 ENCOUNTER — Inpatient Hospital Stay (HOSPITAL_COMMUNITY): Payer: Medicare Other | Admitting: Occupational Therapy

## 2017-07-16 ENCOUNTER — Inpatient Hospital Stay (HOSPITAL_COMMUNITY): Payer: Medicare Other | Admitting: Speech Pathology

## 2017-07-16 DIAGNOSIS — H10022 Other mucopurulent conjunctivitis, left eye: Secondary | ICD-10-CM

## 2017-07-16 DIAGNOSIS — I63521 Cerebral infarction due to unspecified occlusion or stenosis of right anterior cerebral artery: Secondary | ICD-10-CM

## 2017-07-16 LAB — CBC WITH DIFFERENTIAL/PLATELET
BASOS PCT: 0 %
Basophils Absolute: 0 10*3/uL (ref 0.0–0.1)
EOS ABS: 0.1 10*3/uL (ref 0.0–0.7)
EOS PCT: 1 %
HCT: 39.3 % (ref 39.0–52.0)
HEMOGLOBIN: 12.8 g/dL — AB (ref 13.0–17.0)
Lymphocytes Relative: 7 %
Lymphs Abs: 0.7 10*3/uL (ref 0.7–4.0)
MCH: 29.9 pg (ref 26.0–34.0)
MCHC: 32.6 g/dL (ref 30.0–36.0)
MCV: 91.8 fL (ref 78.0–100.0)
MONO ABS: 1.2 10*3/uL — AB (ref 0.1–1.0)
Monocytes Relative: 13 %
NEUTROS PCT: 79 %
Neutro Abs: 7.5 10*3/uL (ref 1.7–7.7)
PLATELETS: 216 10*3/uL (ref 150–400)
RBC: 4.28 MIL/uL (ref 4.22–5.81)
RDW: 14 % (ref 11.5–15.5)
WBC: 9.5 10*3/uL (ref 4.0–10.5)

## 2017-07-16 LAB — COMPREHENSIVE METABOLIC PANEL
ALBUMIN: 2.7 g/dL — AB (ref 3.5–5.0)
ALK PHOS: 49 U/L (ref 38–126)
ALT: 33 U/L (ref 17–63)
ANION GAP: 9 (ref 5–15)
AST: 49 U/L — ABNORMAL HIGH (ref 15–41)
BUN: 18 mg/dL (ref 6–20)
CALCIUM: 8.7 mg/dL — AB (ref 8.9–10.3)
CHLORIDE: 113 mmol/L — AB (ref 101–111)
CO2: 21 mmol/L — AB (ref 22–32)
Creatinine, Ser: 1.09 mg/dL (ref 0.61–1.24)
GFR calc non Af Amer: 59 mL/min — ABNORMAL LOW (ref >60)
Glucose, Bld: 117 mg/dL — ABNORMAL HIGH (ref 65–99)
POTASSIUM: 3.8 mmol/L (ref 3.5–5.1)
SODIUM: 143 mmol/L (ref 135–145)
Total Bilirubin: 0.9 mg/dL (ref 0.3–1.2)
Total Protein: 5.8 g/dL — ABNORMAL LOW (ref 6.5–8.1)

## 2017-07-16 LAB — MAGNESIUM: Magnesium: 1.9 mg/dL (ref 1.7–2.4)

## 2017-07-16 LAB — PHOSPHORUS: PHOSPHORUS: 2.8 mg/dL (ref 2.5–4.6)

## 2017-07-16 MED ORDER — PANTOPRAZOLE SODIUM 40 MG PO PACK
40.0000 mg | PACK | Freq: Every day | ORAL | Status: DC
  Administered 2017-07-16 – 2017-07-25 (×10): 40 mg via ORAL
  Filled 2017-07-16 (×5): qty 20

## 2017-07-16 MED ORDER — DICLOFENAC SODIUM 1 % TD GEL
2.0000 g | Freq: Four times a day (QID) | TRANSDERMAL | Status: DC
  Administered 2017-07-16 – 2017-08-03 (×68): 2 g via TOPICAL
  Filled 2017-07-16 (×2): qty 100

## 2017-07-16 MED ORDER — CIPROFLOXACIN HCL 0.3 % OP SOLN
1.0000 [drp] | OPHTHALMIC | Status: DC
  Administered 2017-07-16 – 2017-07-18 (×11): 1 [drp] via OPHTHALMIC
  Filled 2017-07-16: qty 2.5

## 2017-07-16 NOTE — Plan of Care (Signed)
  RH BOWEL ELIMINATION RH STG MANAGE BOWEL WITH ASSISTANCE Description STG Manage Bowel with Mod Assistance. LBM 2/1 per report in acute care but not recorded 07/16/2017 1527 - Not Progressing by Ander Slade, RN

## 2017-07-16 NOTE — Evaluation (Signed)
Occupational Therapy Assessment and Plan  Patient Details  Name: Arthur [name] MRN: [id number] Date of Birth: 27-Jun-1928  OT Diagnosis: apraxia, cognitive deficits, disturbance of vision, hemiplegia affecting non-dominant side, muscle weakness (generalized) and increased tone Rehab Potential: Rehab Potential (ACUTE ONLY): Fair ELOS: 21-23 days   Today's Date: 07/16/2017 OT Individual Time: 6767-2094 OT Individual Time Calculation (min): 75 min     Problem List:  Patient Active Problem List   Diagnosis Date Noted  . Acute ischemic right ACA stroke ([place]) 07/15/2017  . Left spastic hemiparesis ([place]) 07/13/2017  . Ischemic stroke ([place]) 07/11/2017  . Acute arterial ischemic stroke, multifocal, anterior circulation ([place]) 07/11/2017  . Acute respiratory failure ([place])   . Abnormal nuclear stress test 01/24/2015  . Encounter for therapeutic drug monitoring 07/19/2013  . Long term current use of anticoagulant 05/28/2011  . Dizziness 05/14/2011  . Hyperlipidemia   . Hypertension   . Coronary artery disease   . MVP (mitral valve prolapse)   . Paroxysmal atrial fibrillation (HCC)   . Chronic renal insufficiency   . 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 [facility])     Past Medical History:  Past Medical History:  Diagnosis Date  . Arthritis    "some in my joints" (01/24/2015)  . CKD (chronic kidney disease), stage II   . Coronary artery disease    a. s/p CABG in 2005 with MV repair and MAZE procedure. b. s/p DES to prox RCA 01/2015.   Marland Kitchen CVA (cerebrovascular accident) [facility]) ~ 2014   RIGHT BRAIN; denies residual on 01/24/2015  . Esophagitis    Distal esophagitis  . GERD (gastroesophageal reflux disease)   . Heart murmur   . History of hiatal hernia   . History of recurrent TIAs   . Hyperlipidemia   . Hypertension   . Hypertensive vascular disease   . MVP (mitral valve prolapse)   . Nephrolithiasis   . Odynophagia   . PAF (paroxysmal atrial fibrillation) ([place])   .  Transudative pleural effusion    Past Surgical History:  Past Surgical History:  Procedure Laterality Date  . APPENDECTOMY  1960's  . CARDIAC CATHETERIZATION  03/19/2004  . CARDIAC CATHETERIZATION N/A 01/24/2015   Procedure: Left Heart Cath and Coronary Angiography;  Surgeon: Peter M Martinique, MD;  Location: [place] CV LAB;  Service: Cardiovascular;  Laterality: N/A;  . CARDIAC CATHETERIZATION N/A 01/24/2015   Procedure: Coronary Stent Intervention;  Surgeon: Peter M Martinique, MD;  Location: [place] CV LAB;  Service: Cardiovascular;  Laterality: N/A;  . CORONARY ANGIOPLASTY WITH STENT PLACEMENT  01/24/2015   "1 stent"  . CORONARY ARTERY BYPASS GRAFT  04/2004   LIMA GRAFT TO THE DISTAL LAD, SAPHENOUS VEIN GRAFT TO THE FIRST DIADGONAL BRANCH, SAPHENOUS VEIN GRAFT TO THE THIRD MARIGINAL BRANCH, AND SAPHENOUS VEIN GRAFT TO THE PDA  . IR ANGIO VERTEBRAL SEL SUBCLAVIAN INNOMINATE UNI R MOD SED  07/11/2017  . IR PERCUTANEOUS ART THROMBECTOMY/INFUSION INTRACRANIAL INC DIAG ANGIO  07/11/2017  . MAZE  04/2004  . MITRAL VALVE REPAIR  04/2004  . RADIOLOGY WITH ANESTHESIA N/A 07/11/2017   Procedure: RADIOLOGY WITH ANESTHESIA;  Surgeon: Luanne Bras, MD;  Location: [place];  Service: Radiology;  Laterality: N/A;  . TEE WITHOUT CARDIOVERSION  05/19/2011   Procedure: TRANSESOPHAGEAL ECHOCARDIOGRAM (TEE);  Surgeon: Peter Martinique, MD;  Location: [facility] ENDOSCOPY;  Service: Cardiovascular;  Laterality: N/A;  . TONSILLECTOMY AND ADENOIDECTOMY  1944    Assessment & Plan Clinical Impression: Arthur [name] is a  84 y.o. male with history of CKD, CAD s/p CABG, CVA, A fib s/p MAZE- chronic coumadin who was admitted on 07/11/17 with left sided weakness with right gaze preference. INR subtherapeutic at admission and CTA head neck showed calcified plaque of aorta and carotid bifurcation, right M2 superior division proximal occlusion with poor down stream collateralization and multiple areas of mild to moderate  intracranial atherosclerosis. He underwent cerebral angio with complete revascularization of R-ACA with IA tPA and IV tPA. He tolerated extubation without difficulty on 1/29. MRI/MRA brain done revealing acute moderate R-MCA, small right distal ACA infarcts, small right posterior watershed territory infarct with minimal petechial hemorrhage and old large left cerebellar infarct with successful revascularization of R-MCA occlusion.  MBS done yesterday and he was placed on Dysphagia 1, nectar liquids. He developed fever yesterday pm with leucocytosis and CXR showed low lung volumes. UA negative and he was started on Levaquin due to concerns of aspiration PNA. Stroke felt to be embolic due to subtherapeutic coumadin. Anticoagulation on hold due to size of stroke--now on ASA with recommendations to resume anticoagulation in 5-7 days. Patient with resultant left hemiparesis, left inattention with right gaze preference, dysphagia and left facial droop. Therapy ongoing and CIR recommended due to functional deficits.  Patient transferred to CIR on 07/15/2017 .    Patient currently requires total with basic self-care skills secondary to muscle weakness, decreased cardiorespiratoy endurance, abnormal tone and motor apraxia, decreased visual perceptual skills, decreased visual motor skills and field cut, decreased attention to left, decreased L side awareness, decreased problem solving and decreased sitting balance, decreased standing balance, decreased postural control, hemiplegia and decreased balance strategies.  Prior to hospitalization, patient was fully independent, driving, active. Patient will benefit from skilled intervention to increase independence with basic self-care skills prior to discharge home with care partner.  Anticipate patient will require 24 hour supervision and moderate physical assestance and follow up home health.  OT - End of Session Activity Tolerance: Tolerates 10 - 20 min activity with  multiple rests Endurance Deficit: Yes OT Assessment Rehab Potential (ACUTE ONLY): Fair OT Patient demonstrates impairments in the following area(s): Balance;Cognition;Endurance;Motor;Sensory;Perception;Vision OT Basic ADL's Functional Problem(s): Eating;Grooming;Bathing;Dressing;Toileting OT Transfers Functional Problem(s): Toilet OT Additional Impairment(s): Fuctional Use of Upper Extremity OT Plan OT Intensity: Minimum of 1-2 x/day, 45 to 90 minutes OT Frequency: 5 out of 7 days OT Duration/Estimated Length of Stay: 21-23 days OT Treatment/Interventions: Balance/vestibular training;Cognitive remediation/compensation;Discharge planning;DME/adaptive equipment instruction;Functional mobility training;Neuromuscular re-education;Patient/family education;Self Care/advanced ADL retraining;Therapeutic Activities;UE/LE Strength taining/ROM;Therapeutic Exercise;UE/LE Coordination activities;Visual/perceptual remediation/compensation OT Self Feeding Anticipated Outcome(s): supervision OT Basic Self-Care Anticipated Outcome(s): mod A OT Toileting Anticipated Outcome(s): max A OT Bathroom Transfers Anticipated Outcome(s): mod A to toilet OT Recommendation Patient destination: Home Follow Up Recommendations: Home health OT Equipment Recommended: 3 in 1 bedside comode   Skilled Therapeutic Intervention Pt seen for initial evaluation.  Pt received in bed and was alert enough to have a fluid conversation, answer questions, and was fully oriented. Discussed rehab process, purpose and goals of OT, his goals, and estimated LOS.  He needed a significant amount of A with all mobility and self care (max to total A of 2 overall) due to increased tone and absent sensation and active movement in L arm and leg, painful R knee, limited R shoulder ROM, L visual field cut and L inattention.  Bathing and dressing bed level in supine with rolling.  +2 A from the PT to sit to EOB and squat pivot to w/c. Pt adjusted  in w/c  with quick release belt and Lap tray.  RN in room with patient.   OT Evaluation Precautions/Restrictions  Precautions Precautions: Fall Precaution Comments: L inattention Restrictions Weight Bearing Restrictions: No Pain Pain Assessment Pain Assessment: No/denies pain(no pain at rest, painful R knee with flexion) Home Living/Prior [place] expects to be discharged to:: Private residence Living Arrangements: Spouse/significant other Available Help at Discharge: Family, Available 24 hours/day(wife available 24/7) Type of Home: House Home Access: Stairs to enter Technical brewer of Steps: 5 Entrance Stairs-Rails: Left Home Layout: One level  Lives With: Spouse Prior Function Level of Independence: Independent with transfers, Independent with gait, Independent with basic ADLs  Able to Take Stairs?: Yes Driving: Yes Vocation: Retired Leisure: Hobbies-yes (Comment) Comments: painting ADL ADL ADL Comments: refer to functional navigator Vision Baseline Vision/History: Wears glasses Wears Glasses: At all times Vision Assessment?: Vision impaired- to be further tested in functional context Visual Fields: Left visual field deficit Additional Comments: L eye ptosis Perception  Perception: Impaired Inattention/Neglect: Does not attend to left side of body;Does not attend to left visual field Praxis Praxis: Impaired Praxis Impairment Details: Motor planning Cognition Overall Cognitive Status: Impaired/Different from baseline Arousal/Alertness: Awake/alert Orientation Level: Person;Place;Situation Person: Oriented Place: Oriented Situation: Oriented Year: 2019 Month: January Day of Week: Correct Memory: Appears intact Immediate Memory Recall: Sock;Blue;Bed Memory Recall: Sock;Blue;Bed Memory Recall Sock: Without Cue Memory Recall Blue: Without Cue Memory Recall Bed: Without Cue Awareness: Impaired Problem Solving:  Impaired Safety/Judgment: Impaired Sensation Sensation Light Touch: Impaired by gross assessment Stereognosis: Impaired by gross assessment Hot/Cold: Impaired by gross assessment Proprioception: Impaired by gross assessment Additional Comments: LUE/LLE absent sensation Coordination Gross Motor Movements are Fluid and Coordinated: No Fine Motor Movements are Fluid and Coordinated: No Coordination and Movement Description: LUE/LLE no active movement with clonus/hypertone Motor  Motor Motor: Hemiplegia Motor - Skilled Clinical Observations: increased tone LUE/LLE Mobility  Bed Mobility Bed Mobility: Supine to Sit;Sit to Supine Supine to Sit: 1: +1 Total assist Sit to Supine: 1: +1 Total assist  Trunk/Postural Assessment  Cervical Assessment Cervical Assessment: Exceptions to WFL(head tilts and rotates to the R) Thoracic Assessment Thoracic Assessment: Exceptions to WFL(kyphosis in sitting) Lumbar Assessment Lumbar Assessment: Exceptions to WFL(posterior tilt) Postural Control Postural Control: Deficits on evaluation Trunk Control: leans to his L, unable to hold posture without support  Balance Static Sitting Balance Static Sitting - Level of Assistance: 2: Max assist Extremity/Trunk Assessment RUE Assessment RUE Assessment: Exceptions to [facility] RUE AROM (degrees) Right Shoulder Flexion: 100 Degrees RUE Strength RUE Overall Strength Comments: 4/5 throughout except shoulder LUE Assessment LUE Assessment: Exceptions to WFL LUE AROM (degrees) Overall AROM Left Upper Extremity: Deficits LUE Overall AROM Comments: no active movement LUE Tone LUE Tone: Hypertonic;Other (Comment) Hypertonic Details: clonus when extending elbow   See Function Navigator for Current Functional Status.   Refer to Care Plan for Long Term Goals  Recommendations for other services: None    Discharge Criteria: Patient will be discharged from OT if patient refuses treatment 3 consecutive times  without medical reason, if treatment goals not met, if there is a change in medical status, if patient makes no progress towards goals or if patient is discharged from hospital.  The above assessment, treatment plan, treatment alternatives and goals were discussed and mutually agreed upon: by patient  [place] 07/16/2017, 1:01 PM

## 2017-07-16 NOTE — Evaluation (Signed)
Speech Language Pathology Assessment and Plan  Patient Details  Name: Arthur [name] MRN: [id number] Date of Birth: [birth year]  SLP Diagnosis: Dysarthria;Cognitive Impairments;Dysphagia  Rehab Potential: Good ELOS: 4 weeks    Today's Date: 07/16/2017 SLP Individual Time: 1430-1530 SLP Individual Time Calculation (min): 60 min   Problem List:  Patient Active Problem List   Diagnosis Date Noted  . Acute ischemic right ACA stroke ([place]) 07/15/2017  . Left spastic hemiparesis ([place]) 07/13/2017  . Ischemic stroke ([place]) 07/11/2017  . Acute arterial ischemic stroke, multifocal, anterior circulation ([place]) 07/11/2017  . Acute respiratory failure ([place])   . Abnormal nuclear stress test 01/24/2015  . Encounter for therapeutic drug monitoring 07/19/2013  . Long term current use of anticoagulant 05/28/2011  . Dizziness 05/14/2011  . Hyperlipidemia   . Hypertension   . Coronary artery disease   . MVP (mitral valve prolapse)   . Paroxysmal atrial fibrillation (H[facility])   . Chronic renal insufficiency   . 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 [facility])    Past Medical History:  Past Medical History:  Diagnosis Date  . Arthritis    "some in my joints" (01/24/2015)  . CKD (chronic kidney disease), stage II   . Coronary artery disease    a. s/p CABG in 2005 with MV repair and MAZE procedure. b. s/p DES to prox RCA 01/2015.   Marland Kitchen CVA (cerebrovascular accident) [facility]) ~ 2014   RIGHT BRAIN; denies residual on 01/24/2015  . Esophagitis    Distal esophagitis  . GERD (gastroesophageal reflux disease)   . Heart murmur   . History of hiatal hernia   . History of recurrent TIAs   . Hyperlipidemia   . Hypertension   . Hypertensive vascular disease   . MVP (mitral valve prolapse)   . Nephrolithiasis   . Odynophagia   . PAF (paroxysmal atrial fibrillation) ([place])   . Transudative pleural effusion    Past Surgical History:  Past Surgical History:  Procedure Laterality Date  . APPENDECTOMY   1960's  . CARDIAC CATHETERIZATION  03/19/2004  . CARDIAC CATHETERIZATION N/A 01/24/2015   Procedure: Left Heart Cath and Coronary Angiography;  Surgeon: Peter M Martinique, MD;  Location: [place] CV [facility];  Service: Cardiovascular;  Laterality: N/A;  . CARDIAC CATHETERIZATION N/A 01/24/2015   Procedure: Coronary Stent Intervention;  Surgeon: Peter M Martinique, MD;  Location: [place] [facility];  Service: Cardiovascular;  Laterality: N/A;  . CORONARY ANGIOPLASTY WITH STENT PLACEMENT  01/24/2015   "1 stent"  . CORONARY ARTERY BYPASS GRAFT  04/2004   LIMA GRAFT TO THE DISTAL LAD, SAPHENOUS VEIN GRAFT TO THE FIRST DIADGONAL BRANCH, SAPHENOUS VEIN GRAFT TO THE THIRD MARIGINAL BRANCH, AND SAPHENOUS VEIN GRAFT TO THE PDA  . IR ANGIO VERTEBRAL SEL SUBCLAVIAN INNOMINATE UNI R MOD SED  07/11/2017  . IR PERCUTANEOUS ART THROMBECTOMY/INFUSION INTRACRANIAL INC DIAG ANGIO  07/11/2017  . MAZE  04/2004  . MITRAL VALVE REPAIR  04/2004  . RADIOLOGY WITH ANESTHESIA N/A 07/11/2017   Procedure: RADIOLOGY WITH ANESTHESIA;  Surgeon: Luanne Bras, MD;  Location: [facility];  Service: Radiology;  Laterality: N/A;  . TEE WITHOUT CARDIOVERSION  05/19/2011   Procedure: TRANSESOPHAGEAL ECHOCARDIOGRAM (TEE);  Surgeon: Peter Martinique, MD;  Location: [facility];  Service: Cardiovascular;  Laterality: N/A;  . TONSILLECTOMY AND ADENOIDECTOMY  1944    Assessment / Plan / Recommendation Clinical Impression Arthur [name] is a 84 y.o. male with history of CKD, CAD s/p CABG, CVA, A fib s/p MAZE- chronic  coumadin who was admitted on 07/11/17 with left sided weakness with right gaze preference. INR subtherapeutic at admission and CTA head neck showed calcified plaque of aorta and carotid bifurcation, right M2 superior division proximal occlusion with poor down stream collateralization and multiple areas of mild to moderate intracranial atherosclerosis. He underwent cerebral angio with complete revascularization of R-ACA with IA tPA and IV tPA.  He tolerated extubation without difficulty on 1/29. MRI/MRA brain done revealing acute moderate R-MCA, small right distal ACA infarcts, small right posterior watershed territory infarct with minimal petechial hemorrhage and old large left cerebellar infarct with successful revascularization of R-MCA occlusion. MBS done yesterday and he was placed on Dysphagia 1, nectar liquids. He developed fever yesterday pm with leucocytosis and CXR showed low lung volumes. UA negative and he was started on Levaquin due to concerns of aspiration PNA. Stroke felt to be embolic due to subtherapeutic coumadin. Anticoagulation on hold due to size of stroke--now on ASA with recommendations to resume anticoagulation in 5-7 days. Patient with resultant left hemiparesis, left inattention with right gaze preference, dysphagia and left facial droop. Therapy ongoing and CIR recommended due to functional deficits.  Pt admited to CIR on 07/15/17.   Bedside swallow evaluation and cognitive-linguistic evaluations completed on 07/16/17. Pt presents with moderate oral phase  and mild pharyngeal phase dysphagia. As a result he is currently on dysphagia 1 diet with necatr thick liquids d/t decreased bolus manipulation on left, anterior left spillage and pocketing on left. he presents with mild wetness post swallow of nectar indicative of pharyngeal residue which was cleared with double swallow. Pt also presents with moderate dysarthria at the phrase level d/t facial weakness, imprecise articulation and decreased vocal intensity and is~ 50% intelligible at phrase level. Although he obtained a score of 18 out of 22 on the [place] (n=>18) he required signficantly more than a reasonable amount of time. Skilled ST services are required to address the above mentioned deficits, increase functional independence and reduce caregiver burden. Anticipate that pt will require 24 hour supervision at discharge with follow up [place].    Skilled Therapeutic  Interventions          Skilled treatment session focused on completion of BSE and SLE, see above. Education also provided to wife and pt on swallow deficits and results of recent MBS. Wfie requested to be signed off to supervision pt with meals, however she didn't display full understanding of current diet as she had banana present and wanted to give it to him. Will continue to provide education.    SLP Assessment  Patient will need skilled Speech Lanaguage Pathology Services during CIR admission    Recommendations  SLP Diet Recommendations: Dysphagia 1 (Puree);Nectar Liquid Administration via: Cup Medication Administration: Crushed with puree Supervision: Patient able to self feed;Full supervision/cueing for compensatory strategies Compensations: Slow rate;Small sips/bites;Minimize environmental distractions;Multiple dry swallows after each bite/sip(Double swallow) Postural Changes and/or Swallow Maneuvers: Seated upright 90 degrees Oral Care Recommendations: Oral care BID Patient destination: Home Follow up Recommendations: Home Health SLP;24 hour supervision/assistance Equipment Recommended: To be determined    SLP Frequency 3 to 5 out of 7 days   SLP Duration  SLP Intensity  SLP Treatment/Interventions 4 weeks  Minumum of 1-2 x/day, 30 to 90 minutes  Cognitive remediation/compensation;Cueing hierarchy;Dysphagia/aspiration precaution training;Functional tasks;Patient/family education;Therapeutic Activities;Medication managment;Speech/Language facilitation    Pain    Prior Functioning Cognitive/Linguistic Baseline: Within functional limits Type of Home: House  Lives With: Spouse Available Help at Discharge: Family;Available 24 hours/day  Vocation: Retired  Function:  Eating Eating   Modified Consistency Diet: Yes Eating Assist Level: Supervision or verbal cues;Helper feeds patient           Cognition Comprehension Comprehension assist level: Understands basic 90% of  the time/cues < 10% of the time  Expression   Expression assist level: Expresses basic 50 - 74% of the time/requires cueing 25 - 49% of the time. Needs to repeat parts of sentences.  Social Interaction Social Interaction assist level: Interacts appropriately 90% of the time - Needs monitoring or encouragement for participation or interaction.  Problem Solving Problem solving assist level: Solves basic 75 - 89% of the time/requires cueing 10 - 24% of the time;Solves basic 90% of the time/requires cueing < 10% of the time  Memory Memory assist level: Recognizes or recalls 90% of the time/requires cueing < 10% of the time   Short Term Goals: Week 1: SLP Short Term Goal 1 (Week 1): Pt will complete complex problem solving tasks in timely manner with supervision cues.  SLP Short Term Goal 2 (Week 1): Pt will utilize speech intelligibility strategies to achieve ~ 75% intelligibility at the phrase level.  SLP Short Term Goal 3 (Week 1): Pt will consume dysphagia 1 diet iwth necatr thick liquids iwthoutovert s/s of aspiraiton and Min A cues for use of compensatory swallow strategies.  SLP Short Term Goal 4 (Week 1): Pt will consume trials of ice chips without overt s/s of aspiration to demonstrate readiness for repeat instrumental study.  SLP Short Term Goal 5 (Week 1): Pt will complete 3 set sof 10 CTAR before fatigue.   Refer to Care Plan for Long Term Goals  Recommendations for other services: None   Discharge Criteria: Patient will be discharged from SLP if patient refuses treatment 3 consecutive times without medical reason, if treatment goals not met, if there is a change in medical status, if patient makes no progress towards goals or if patient is discharged from hospital.  The above assessment, treatment plan, treatment alternatives and goals were discussed and mutually agreed upon: by patient and by family  Gracia Saggese 07/16/2017, 5:37 PM

## 2017-07-16 NOTE — Plan of Care (Signed)
Pt requiring total assist with ADLs as he has just transferred here for tonight and is tired and at times confused.

## 2017-07-16 NOTE — Progress Notes (Signed)
Subjective/Complaints: Right knee pain noted by pt and PT , old football injury No recent trauma  ROS- no CP, SOB, N/V/D  Objective: Vital Signs: Blood pressure (!) 160/76, pulse 74, temperature 98.6 F (37 C), temperature source Oral, resp. rate 20, height 5' 7"  (1.702 m), weight 79.6 kg (175 lb 7.8 oz), SpO2 97 %. Dg Chest Port 1 View  Result Date: 07/14/2017 CLINICAL DATA:  84 year old male with a history of fever EXAM: PORTABLE CHEST 1 VIEW COMPARISON:  07/13/2017 FINDINGS: Cardiomediastinal silhouette unchanged in size and contour. Surgical changes of median sternotomy and CABG, as well as valve repair. Low lung volumes with crowded interstitial markings. No pneumothorax. Thickening at the pleuroparenchymal interface on the right. No new confluent airspace disease. Linear opacity in the right mid lung, likely atelectasis. No acute displaced fracture. IMPRESSION: Low lung volumes with basilar atelectasis, and no evidence of new focal airspace disease. Pleuroparenchymal thickening at the periphery of the right lung, may reflect small pleural fluid versus scarring. Electronically Signed   By: Corrie Mckusick D.O.   On: 07/14/2017 17:02   Results for orders placed or performed during the hospital encounter of 07/15/17 (from the past 72 hour(s))  CBC WITH DIFFERENTIAL     Status: Abnormal   Collection Time: 07/16/17  6:22 AM  Result Value Ref Range   WBC 9.5 4.0 - 10.5 K/uL   RBC 4.28 4.22 - 5.81 MIL/uL   Hemoglobin 12.8 (L) 13.0 - 17.0 g/dL   HCT 39.3 39.0 - 52.0 %   MCV 91.8 78.0 - 100.0 fL   MCH 29.9 26.0 - 34.0 pg   MCHC 32.6 30.0 - 36.0 g/dL   RDW 14.0 11.5 - 15.5 %   Platelets 216 150 - 400 K/uL   Neutrophils Relative % 79 %   Neutro Abs 7.5 1.7 - 7.7 K/uL   Lymphocytes Relative 7 %   Lymphs Abs 0.7 0.7 - 4.0 K/uL   Monocytes Relative 13 %   Monocytes Absolute 1.2 (H) 0.1 - 1.0 K/uL   Eosinophils Relative 1 %   Eosinophils Absolute 0.1 0.0 - 0.7 K/uL   Basophils Relative 0 %    Basophils Absolute 0.0 0.0 - 0.1 K/uL    Comment: Performed at [facility], [address] [place], [place] [address]  Comprehensive metabolic panel     Status: Abnormal   Collection Time: 07/16/17  6:22 AM  Result Value Ref Range   Sodium 143 135 - 145 mmol/L   Potassium 3.8 3.5 - 5.1 mmol/L   Chloride 113 (H) 101 - 111 mmol/L   CO2 21 (L) 22 - 32 mmol/L   Glucose, Bld 117 (H) 65 - 99 mg/dL   BUN 18 6 - 20 mg/dL   Creatinine, Ser 1.09 0.61 - 1.24 mg/dL   Calcium 8.7 (L) 8.9 - 10.3 mg/dL   Total Protein 5.8 (L) 6.5 - 8.1 g/dL   Albumin 2.7 (L) 3.5 - 5.0 g/dL   AST 49 (H) 15 - 41 U/L   ALT 33 17 - 63 U/L   Alkaline Phosphatase 49 38 - 126 U/L   Total Bilirubin 0.9 0.3 - 1.2 mg/dL   GFR calc non Af Amer 59 (L) >60 mL/min   GFR calc Af Amer >60 >60 mL/min    Comment: (NOTE) The eGFR has been calculated using the CKD EPI equation. This calculation has not been validated in all clinical situations. eGFR's persistently <60 mL/min signify possible Chronic Kidney Disease.    Anion  gap 9 5 - 15    Comment: Performed at [facility], [place] [address], [place], [place] [address]  Magnesium     Status: None   Collection Time: 07/16/17  6:22 AM  Result Value Ref Range   Magnesium 1.9 1.7 - 2.4 mg/dL    Comment: Performed at [facility] [address], [place], [place] [address]  Phosphorus     Status: None   Collection Time: 07/16/17  6:22 AM  Result Value Ref Range   Phosphorus 2.8 2.5 - 4.6 mg/dL    Comment: Performed at [facility] [address], [place], [place] [address]     HEENT: left eye injected , mucus, left facial droop Cardio: RRR and no murmur Resp: CTA B/L and unlabored GI: BS positive and NT, ND Extremity:  No Edema Skin:   Other ecchymosis Left forearm Neuro: Alert/Oriented, Flat, Abnormal Motor 0/5 in LUE and LLE, Abnormal FMC Tone  Hypotonia and Dysarthric Musc/Skel:  Extremity tender RIght knee, no erythema, reduced ROM Gen  NAD   Assessment/Plan: 1. 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 Physiatrist is providing close team supervision and 24 hour management of active medical problems listed below. Physiatrist and rehab team continue to assess barriers to discharge/monitor patient progress toward functional and medical goals. FIM:    Function- Upper Body Dressing/Undressing What is the patient wearing?: Hospital gown Assist Level: Touching or steadying assistance(Pt > 75%), More than reasonable time Function - Lower Body Dressing/Undressing Lower body dressing/undressing activity did not occur: N/A  Function - Toileting Toileting activity did not occur: No continent bowel/bladder event  Function - Air cabin crew transfer activity did not occur: Safety/medical concerns        Function - Comprehension Comprehension: Auditory Comprehension assist level: Understands basic 50 - 74% of the time/ requires cueing 25 - 49% of the time  Function - Expression Expression: Verbal Expression assist level: Expresses basis less than 25% of the time/requires cueing >75% of the time.  Function - Social Interaction Social Interaction assist level: Interacts appropriately 25 - 49% of time - Needs frequent redirection.  Function - Problem Solving Problem solving assist level: Solves basic less than 25% of the time - needs direction nearly all the time or does not effectively solve problems and may need a restraint for safety  Function - Memory Memory assist level: Recognizes or recalls less than 25% of the time/requires cueing greater than 75% of the time Patient normally able to recall (first 3 days only): That he or she is in a hospital  Medical Problem List and Plan:  1. Functional deficits and left  hemiparesis secondary to right MCA infarct, small right ACA infarct, posterior watershed infarct due to cardio-embolic source  -CIR evals today 2. DVT Prophylaxis/Anticoagulation: Pharmaceutical: Lovenox  -to begin eliquis in 5-7 days  3. Pain Management: tylenol prn  4. Mood: LCSW to follow for evaluation and support  5. Neuropsych: This patient is not capable of making decisions on his own behalf.  6. Skin/Wound Care: routine pressure relief measures  7. Fluids/Electrolytes/Nutrition: monitor I/O. Offer liquids between meals to maintain adequate hydration. Change IVF to nights to avoid overload  8. CAD s/p Maze/ Chronic A fib: Monitor HR bid. On ASA--to start Eliquis in 5-7 days.  9. FUO: Continue Levaquin  D# 2  10 GERD: Managed with Protonix  11. CKD stage II: Baseline SCr 1.2. Avoid nephrotoxic medications and monitor lytes serially.  12. HTN: Monitor BP bid--Coreg, Lotensin, Amlodipine and  Vitals:   07/15/17 1814 07/16/17 0500  BP: 132/73 (!) 160/76  Pulse: 82 74  Resp: 18 20  Temp: 98.1 F (36.7 C) 98.6 F (37 C)  SpO2: 97% 97%   13. Malnutrition? Electrolyte abnormality: Continue Phosphorous and potassium supplement. Recheck labs in am.  14. Irritation left eye: also appears to have conjunctivitis, add ciloxan gtts    LOS (Days) 1 A FACE TO Otero E Kirsteins 07/16/2017, 10:19 AM

## 2017-07-16 NOTE — Evaluation (Addendum)
Physical Therapy Assessment and Plan  Patient Details  Name: Arthur [name] MRN: [id number] Date of Birth: Oct 02, 1928  PT Diagnosis: Abnormality of gait, Hemiplegia non-dominant, Impaired sensation and Pain in joint Rehab Potential: Fair ELOS: 27 to 1    Today's Date: 07/16/2017 PT Individual Time: 1000-1100 PT Individual Time Calculation (min): 60 min    Problem List:  Patient Active Problem List   Diagnosis Date Noted  . Acute ischemic right ACA stroke ([place]) 07/15/2017  . Left spastic hemiparesis ([place]) 07/13/2017  . Ischemic stroke ([place]) 07/11/2017  . Acute arterial ischemic stroke, multifocal, anterior circulation ([place]) 07/11/2017  . Acute respiratory failure ([place])   . Abnormal nuclear stress test 01/24/2015  . Encounter for therapeutic drug monitoring 07/19/2013  . Long term current use of anticoagulant 05/28/2011  . Dizziness 05/14/2011  . Hyperlipidemia   . Hypertension   . Coronary artery disease   . MVP (mitral valve prolapse)   . Paroxysmal atrial fibrillation (HCC)   . Chronic renal insufficiency   . 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 [facility])     Past Medical History:  Past Medical History:  Diagnosis Date  . Arthritis    "some in my joints" (01/24/2015)  . CKD (chronic kidney disease), stage II   . Coronary artery disease    a. s/p CABG in 2005 with MV repair and MAZE procedure. b. s/p DES to prox RCA 01/2015.   Marland Kitchen CVA (cerebrovascular accident) [facility]) ~ 2014   RIGHT BRAIN; denies residual on 01/24/2015  . Esophagitis    Distal esophagitis  . GERD (gastroesophageal reflux disease)   . Heart murmur   . History of hiatal hernia   . History of recurrent TIAs   . Hyperlipidemia   . Hypertension   . Hypertensive vascular disease   . MVP (mitral valve prolapse)   . Nephrolithiasis   . Odynophagia   . PAF (paroxysmal atrial fibrillation) ([place])   . Transudative pleural effusion    Past Surgical History:  Past Surgical History:  Procedure  Laterality Date  . APPENDECTOMY  1960's  . CARDIAC CATHETERIZATION  03/19/2004  . CARDIAC CATHETERIZATION N/A 01/24/2015   Procedure: Left Heart Cath and Coronary Angiography;  Surgeon: Peter M Martinique, MD;  Location: [place] CV LAB;  Service: Cardiovascular;  Laterality: N/A;  . CARDIAC CATHETERIZATION N/A 01/24/2015   Procedure: Coronary Stent Intervention;  Surgeon: Peter M Martinique, MD;  Location: [place] CV LAB;  Service: Cardiovascular;  Laterality: N/A;  . CORONARY ANGIOPLASTY WITH STENT PLACEMENT  01/24/2015   "1 stent"  . CORONARY ARTERY BYPASS GRAFT  04/2004   LIMA GRAFT TO THE DISTAL LAD, SAPHENOUS VEIN GRAFT TO THE FIRST DIADGONAL BRANCH, SAPHENOUS VEIN GRAFT TO THE THIRD MARIGINAL BRANCH, AND SAPHENOUS VEIN GRAFT TO THE PDA  . IR ANGIO VERTEBRAL SEL SUBCLAVIAN INNOMINATE UNI R MOD SED  07/11/2017  . IR PERCUTANEOUS ART THROMBECTOMY/INFUSION INTRACRANIAL INC DIAG ANGIO  07/11/2017  . MAZE  04/2004  . MITRAL VALVE REPAIR  04/2004  . RADIOLOGY WITH ANESTHESIA N/A 07/11/2017   Procedure: RADIOLOGY WITH ANESTHESIA;  Surgeon: Luanne Bras, MD;  Location: [place];  Service: Radiology;  Laterality: N/A;  . TEE WITHOUT CARDIOVERSION  05/19/2011   Procedure: TRANSESOPHAGEAL ECHOCARDIOGRAM (TEE);  Surgeon: Peter Martinique, MD;  Location: [facility] ENDOSCOPY;  Service: Cardiovascular;  Laterality: N/A;  . TONSILLECTOMY AND ADENOIDECTOMY  1944    Assessment & Plan Clinical Impression: Patient is a 84 y.o. male with history of CKD, CAD s/p CABG,  CVA, A fib s/p MAZE- chronic coumadin who was admitted on 07/11/17 with left sided weakness with right gaze preference. INR subtherapeutic at admission and CTA head neck showed calcified plaque of aorta and carotid bifurcation, right M2 superior division proximal occlusion with poor down stream collateralization and multiple areas of mild to moderate intracranial atherosclerosis. He underwent cerebral angio with complete revascularization of R-ACA with IA tPA  and IV tPA. He tolerated extubation without difficulty on 1/29. MRI/MRA brain done revealing acute moderate R-MCA, small right distal ACA infarcts, small right posterior watershed territory infarct with minimal petechial hemorrhage and old large left cerebellar infarct with successful revascularization of R-MCA occlusion.  MBS done yesterday and he was placed on Dysphagia 1, nectar liquids. He developed fever yesterday pm with leucocytosis and CXR showed low lung volumes. UA negative and he was started on Levaquin due to concerns of aspiration PNA. Stroke felt to be embolic due to subtherapeutic coumadin. Anticoagulation on hold due to size of stroke--now on ASA with recommendations to resume anticoagulation in 5-7 days. Patient with resultant left hemiparesis, left inattention with right gaze preference, dysphagia and left facial droop.  Patient transferred to CIR on 07/15/2017 .   Patient currently requires total with mobility secondary to muscle paralysis, decreased cardiorespiratoy endurance, abnormal tone and decreased coordination, left side neglect and decreased safety awareness.  Prior to hospitalization, patient was independent  with mobility and lived with Spouse in a House home.  Home access is 5Stairs to enter.  Patient will benefit from skilled PT intervention to maximize safe functional mobility, minimize fall risk and decrease caregiver burden for planned discharge home with 24 hour assist.  Anticipate patient will benefit from follow up [facility] at discharge.  PT - End of Session Activity Tolerance: Tolerates 30+ min activity with multiple rests Endurance Deficit: Yes PT Assessment Rehab Potential (ACUTE/IP ONLY): Fair PT Barriers to Discharge: Inaccessible home environment;Decreased caregiver support PT Patient demonstrates impairments in the following area(s): Balance;Endurance;Motor;Pain;Safety PT Transfers Functional Problem(s): Bed Mobility;Bed to Chair;Car PT Locomotion Functional  Problem(s): Wheelchair Mobility;Ambulation;Stairs PT Plan PT Intensity: Minimum of 1-2 x/day ,45 to 90 minutes PT Frequency: 5 out of 7 days PT Duration Estimated Length of Stay: 27 to 30  PT Treatment/Interventions: Ambulation/gait training;Balance/vestibular training;Discharge planning;DME/adaptive equipment instruction;Functional mobility training;Neuromuscular re-education;Patient/family education;Pain management;Splinting/orthotics;Stair training;Therapeutic Activities;UE/LE Strength taining/ROM;UE/LE Coordination activities;Wheelchair propulsion/positioning PT Transfers Anticipated Outcome(s): min A transfers,  PT Locomotion Anticipated Outcome(s): S w/c mobility, mod A gait, mod A for stairs PT Recommendation Follow Up Recommendations: Home health PT Patient destination: Home Equipment Recommended: To be determined  Skilled Therapeutic Intervention PT evaluation completed and treatment plan initiated. Pt performed bed mobility with total A. Pt transferred bed to w/c, w/c to bed squat pivot with total A and verbal cues. Pt's participation with therapy limited by pain in R knee secondary to old foot injury when WB, discussed with MD. Following treatment pt returned to room and left sitting up in bed with call bell within reach.   PT Evaluation Precautions/Restrictions PRECAUTIONS: fall, L inattention RESTRICTIONS: no weight restrictions General Pain Pain Assessment Pain Assessment: No/denies pain(no pain at rest, c/o 7/10 pain R knee with WB) Home Living/Prior Functioning Home Living Home Layout: One level Pt lives with wife, 5 stairs to enter with L rail Prior Function Level of Independence: Independent with transfers;Independent with gait Vision/Perception  As per OT evaluation. L neglect Does not attend to L visual field  Does not attend to L side of body Cognition A&O x 4,  impaired problem solving, awareness, safety Sensation Sensation Absent light touch and  proprioception L LE Coordination Coordination and Movement Description: L LE no active movement Motor  Motor Motor - Skilled Clinical Observations: increased tone LUE/LLE  Mobility BED MOBILITY: supine to edge of bed, edge of bed to supine total A TRANSFERS: squat pivot total A Locomotion  Ambulation Ambulation: Yes Ambulation/Gait Assistance: Not tested (comment)(pt unable to tolerate standing to attempt ambulation) Stairs / Additional Locomotion Stairs: Yes Ramp: Not tested (comment)(pt unable to stand to attempt stairs) Wheelchair Mobility Wheelchair Mobility: Yes Wheelchair Assistance: Not tested (comment)(secondary to fatigue)  Trunk/Postural Assessment  Cervical Assessment Cervical Assessment: Deficits on evaluation Thoracic Assessment Thoracic Assessment: Deficits on evaluation Lumbar Assessment Lumbar Assessment: Deficits on evaluation Postural Control Postural Control: Deficits on evaluation Trunk Control: pt leans left, requiring tactile and verbal cues to correct, unable to maintain sitting balance without R UE support  Balance Balance Balance Assessed: Yes Static Sitting Balance Static Sitting - Level of Assistance: 2: Max assist;3: Mod assist Extremity Assessment  B UEs as per OT evaluation.  RLE Assessment RLE Assessment: Exceptions to [facility] RLE PROM (degrees) Overall PROM Right Lower Extremity: Due to pain;Within functional limits for tasks assessed RLE Overall PROM Comments: grossly WFLs except DF to neutral, significant pain in R knee with WB secondary to old football injury RLE Strength RLE Overall Strength: Deficits;Due to pain RLE Overall Strength Comments: grossly 3-/5 LLE Assessment LLE Assessment: Exceptions to WFL LLE PROM (degrees) Overall PROM Left Lower Extremity: Within functional limits for tasks assessed LLE Overall PROM Comments: except DF to neutral LLE Strength LLE Overall Strength: Deficits LLE Overall Strength Comments: increased  tone L LE, no votional movement on eval   See Function Navigator for Current Functional Status.   Refer to Care Plan for Long Term Goals  Recommendations for other services: None   Discharge Criteria: Patient will be discharged from PT if patient refuses treatment 3 consecutive times without medical reason, if treatment goals not met, if there is a change in medical status, if patient makes no progress towards goals or if patient is discharged from hospital.  The above assessment, treatment plan, treatment alternatives and goals were discussed and mutually agreed upon: by patient  Dub Amis 07/16/2017, 4:27 PM

## 2017-07-17 ENCOUNTER — Inpatient Hospital Stay (HOSPITAL_COMMUNITY): Payer: Medicare Other

## 2017-07-17 DIAGNOSIS — M7989 Other specified soft tissue disorders: Secondary | ICD-10-CM

## 2017-07-17 NOTE — Plan of Care (Signed)
  RH BLADDER ELIMINATION RH STG MANAGE BLADDER WITH ASSISTANCE Description STG Manage Bladder With mod Assistance pt incontinent 07/17/2017 1137 - Not Progressing by Ander Slade, RN

## 2017-07-17 NOTE — IPOC Note (Addendum)
Overall Plan of Care [facility]) Patient Details Name: Arthur [name] MRN: [id number] DOB: 26-Nov-1928  Admitting Diagnosis: <principal problem not specified>  Hospital Problems: Active Problems:   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 (HCC)   Left spastic hemiparesis (HCC)   Acute ischemic right ACA stroke ([place])     Functional Problem List: Nursing Nutrition, Medication Management, Skin Integrity, Sensory, Bladder, Bowel, Endurance, Motor, Perception, Safety  PT Balance, Endurance, Motor, Pain, Safety  OT Balance, Cognition, Endurance, Motor, Sensory, Perception, Vision  SLP Endurance  TR         Basic ADL's: OT Eating, Grooming, Bathing, Dressing, Toileting     Advanced  ADL's: OT       Transfers: PT Bed Mobility, Bed to Chair, Car  OT Toilet     Locomotion: PT Wheelchair Mobility, Ambulation, Stairs     Additional Impairments: OT Fuctional Use of Upper Extremity  SLP Swallowing, Communication, Social Cognition expression Problem Solving, Awareness, Attention  TR      Anticipated Outcomes Item Anticipated Outcome  Self Feeding supervision  Swallowing  Supervision   Basic self-care  mod A  Toileting  max A   Bathroom Transfers mod A to toilet  Bowel/Bladder  Pt will manage bowel and bladder with mod assist at discharge.   Transfers  min A transfers,   Locomotion  S w/c mobility, mod A gait, mod A for stairs  Communication  Supervision  Cognition  Mod I  Pain  Pt will manage pain at 3 or less on a scale of 0-10.   Safety/Judgment  Pt will remain free of falls with injury with min assist.    Therapy Plan: PT Intensity: Minimum of 1-2 x/day ,45 to 90 minutes PT Frequency: 5 out of 7 days PT Duration Estimated Length of Stay: 27 to 30  OT Intensity: Minimum of 1-2 x/day, 45 to 90 minutes OT Frequency: 5 out of 7 days OT Duration/Estimated Length of Stay: 21-23 days SLP Intensity: Minumum of 1-2 x/day, 30 to 90 minutes SLP Frequency: 3  to 5 out of 7 days SLP Duration/Estimated Length of Stay: 4 weeks    Team Interventions: Nursing Interventions Patient/Family Education, Bladder Management, Bowel Management, Discharge Planning, Dysphagia/Aspiration Precaution Training, Medication Management, Disease Management/Prevention, Pain Management, Skin Care/Wound Management, Cognitive Remediation/Compensation  PT interventions Ambulation/gait training, Balance/vestibular training, Discharge planning, DME/adaptive equipment instruction, Functional mobility training, Neuromuscular re-education, Patient/family education, Pain management, Splinting/orthotics, Stair training, Therapeutic Activities, UE/LE Strength taining/ROM, UE/LE Coordination activities, Wheelchair propulsion/positioning  OT Interventions Balance/vestibular training, Cognitive remediation/compensation, Discharge planning, DME/adaptive equipment instruction, Functional mobility training, Neuromuscular re-education, Patient/family education, Self Care/advanced ADL retraining, Therapeutic Activities, UE/LE Strength taining/ROM, Therapeutic Exercise, UE/LE Coordination activities, Visual/perceptual remediation/compensation  SLP Interventions Cognitive remediation/compensation, Cueing hierarchy, Dysphagia/aspiration precaution training, Functional tasks, Patient/family education, Therapeutic Activities, Medication managment, Speech/Language facilitation  TR Interventions    SW/CM Interventions  Pt & Family education Psychosocial Assessment, Discharge Planning   Barriers to Discharge MD  Medical stability  Nursing      PT Inaccessible home environment, Decreased caregiver support    OT      SLP      SW       Team Discharge Planning: Destination: PT-Home ,OT- Home , SLP-Home Projected Follow-up: PT-Home health PT, OT-  Home health OT, SLP-Home Health SLP, 24 hour supervision/assistance Projected Equipment Needs: PT-To be determined, OT- 3 in 1 bedside comode, SLP-To be  determined Equipment Details: PT- , OT-  Patient/family involved in discharge planning: PT- Patient,  OT-Patient,  SLP-Patient, Family member/caregiver  MD ELOS: 76-23d Medical Rehab Prognosis:  Good Assessment:  84 y.o. male with history of CKD, CAD s/p CABG, CVA, A fib s/p MAZE- chronic coumadin who was admitted on 07/11/17 with left sided weakness with right gaze preference. INR subtherapeutic at admission and CTA head neck showed calcified plaque of aorta and carotid bifurcation, right M2 superior division proximal occlusion with poor down stream collateralization and multiple areas of mild to moderate intracranial atherosclerosis. He underwent cerebral angio with complete revascularization of R-ACA with IA tPA and IV tPA. He tolerated extubation without difficulty on 1/29. MRI/MRA brain done revealing acute moderate R-MCA, small right distal ACA infarcts, small right posterior watershed territory infarct with minimal petechial hemorrhage and old large left cerebellar infarct with successful revascularization of R-MCA occlusion.  MBS done yesterday and he was placed on Dysphagia 1, nectar liquids. He developed fever yesterday pm with leucocytosis and CXR showed low lung volumes. UA negative and he was started on Levaquin due to concerns of aspiration PNA. Stroke felt to be embolic due to subtherapeutic coumadin. Anticoagulation on hold due to size of stroke--now on ASA with recommendations to resume anticoagulation in 5-7 days. Patient with resultant left hemiparesis, left inattention with right gaze preference, dysphagia and left facial droop.    Now requiring 24/7 Rehab RN,MD, as well as CIR level PT, OT and SLP.  Treatment team will focus on ADLs and mobility with goals set at [place]  See Team Conference Notes for weekly updates to the plan of care

## 2017-07-17 NOTE — Progress Notes (Signed)
*  Preliminary Results* Bilateral lower extremity venous duplex completed. Bilateral lower extremities are negative for deep vein thrombosis. There is no evidence of Baker's cyst bilaterally.  07/17/2017 12:03 PM Maudry Mayhew, BS, RVT, RDCS, RDMS

## 2017-07-17 NOTE — Progress Notes (Addendum)
Pharmacy Antibiotic Note  Arthur [name] is a 84 y.o. male to has been treated with antibiotics for fever.  Pharmacy has been consulted for Levaquin dosing. PCN allergy. Afebrile, most recent WBC wnl.   Plan: Levaquin 750 mg IV q48hrs. Monitor clinic progress, WBC, TMax, renal function, electrolytes F/u future de-escalation, & length of therapy   Height: 5\' 7"  (170.2 cm) Weight: 175 lb 7.8 oz (79.6 kg) IBW/kg (Calculated) : 66.1  Temp (24hrs), Avg:98.5 F (36.9 C), Min:97.9 F (36.6 C), Max:99.1 F (37.3 C)  Recent Labs  Lab 07/11/17 1820 07/11/17 1826 07/12/17 0359 07/13/17 0350 07/15/17 0351 07/16/17 0622  WBC 7.2  --  8.4 9.5 10.9* 9.5  CREATININE 1.29* 1.20 1.17 1.10 1.14 1.09    Estimated Creatinine Clearance: 49.2 mL/min (by C-G formula based on SCr of 1.09 mg/dL).    Allergies  Allergen Reactions  . Tape Other (See Comments)    PATIENT IS TAKING COUMADIN; HIS SKIN TEARS & BRUISES EASILY; PLEASE USE COBAN WRAP OR AN ALTERNATIVE TO MEDICAL TAPE!!  . Lasix [Furosemide] Rash  . Penicillins Rash    Has patient had a PCN reaction causing immediate rash, facial/tongue/throat swelling, SOB or lightheadedness with hypotension: Yes Has patient had a PCN reaction causing severe rash involving mucus membranes or skin necrosis: Unk Has patient had a PCN reaction that required hospitalization: Unk Has patient had a PCN reaction occurring within the last 10 years:  If all of the above answers are "NO", then may proceed with Cephalosporin use.   . Sulfa Drugs Cross Reactors Rash    Antimicrobials this admission:  Levaquin 1/31>>  Dose adjustments this admission:  n/a  Microbiology results:  1/29 MRSA PCR - negative  Thank you for allowing pharmacy to be a part of this patient's care.  Nida Boatman, PharmD PGY1 Acute Care Pharmacy Resident Pager: [phone] 07/17/2017 9:44 AM

## 2017-07-17 NOTE — Progress Notes (Signed)
Subjective/Complaints: Sleeping but arouses to voice, no pain c/os  ROS- no CP, SOB, N/V/D  Objective: Vital Signs: Blood pressure (!) 136/96, pulse 86, temperature 99.1 F (37.3 C), temperature source Oral, resp. rate 16, height 5' 7"  (1.702 m), weight 79.6 kg (175 lb 7.8 oz), SpO2 98 %. No results found. Results for orders placed or performed during the hospital encounter of 07/15/17 (from the past 72 hour(s))  CBC WITH DIFFERENTIAL     Status: Abnormal   Collection Time: 07/16/17  6:22 AM  Result Value Ref Range   WBC 9.5 4.0 - 10.5 K/uL   RBC 4.28 4.22 - 5.81 MIL/uL   Hemoglobin 12.8 (L) 13.0 - 17.0 g/dL   HCT 39.3 39.0 - 52.0 %   MCV 91.8 78.0 - 100.0 fL   MCH 29.9 26.0 - 34.0 pg   MCHC 32.6 30.0 - 36.0 g/dL   RDW 14.0 11.5 - 15.5 %   Platelets 216 150 - 400 K/uL   Neutrophils Relative % 79 %   Neutro Abs 7.5 1.7 - 7.7 K/uL   Lymphocytes Relative 7 %   Lymphs Abs 0.7 0.7 - 4.0 K/uL   Monocytes Relative 13 %   Monocytes Absolute 1.2 (H) 0.1 - 1.0 K/uL   Eosinophils Relative 1 %   Eosinophils Absolute 0.1 0.0 - 0.7 K/uL   Basophils Relative 0 %   Basophils Absolute 0.0 0.0 - 0.1 K/uL    Comment: Performed at [facility], [address], [place], [place] [address]  Comprehensive metabolic panel     Status: Abnormal   Collection Time: 07/16/17  6:22 AM  Result Value Ref Range   Sodium 143 135 - 145 mmol/L   Potassium 3.8 3.5 - 5.1 mmol/L   Chloride 113 (H) 101 - 111 mmol/L   CO2 21 (L) 22 - 32 mmol/L   Glucose, Bld 117 (H) 65 - 99 mg/dL   BUN 18 6 - 20 mg/dL   Creatinine, Ser 1.09 0.61 - 1.24 mg/dL   Calcium 8.7 (L) 8.9 - 10.3 mg/dL   Total Protein 5.8 (L) 6.5 - 8.1 g/dL   Albumin 2.7 (L) 3.5 - 5.0 g/dL   AST 49 (H) 15 - 41 U/L   ALT 33 17 - 63 U/L   Alkaline Phosphatase 49 38 - 126 U/L   Total Bilirubin 0.9 0.3 - 1.2 mg/dL   GFR calc non Af Amer 59 (L) >60 mL/min   GFR calc Af Amer >60 >60 mL/min    Comment: (NOTE) The eGFR has been calculated using the  CKD EPI equation. This calculation has not been validated in all clinical situations. eGFR's persistently <60 mL/min signify possible Chronic Kidney Disease.    Anion gap 9 5 - 15    Comment: Performed at [facility] [address], [place], [place] [address]  Magnesium     Status: None   Collection Time: 07/16/17  6:22 AM  Result Value Ref Range   Magnesium 1.9 1.7 - 2.4 mg/dL    Comment: Performed at [facility] [address], [place], [place] [address]  Phosphorus     Status: None   Collection Time: 07/16/17  6:22 AM  Result Value Ref Range   Phosphorus 2.8 2.5 - 4.6 mg/dL    Comment: Performed at [facility] [address], [place], [place] [address]     HEENT: left eye less injected , minimal mucus, left facial droop Cardio: RRR and no  murmur Resp: CTA B/L and unlabored GI: BS positive and NT, ND Extremity:  No Edema Skin:   Other ecchymosis Left forearm Neuro: Alert/Oriented, Flat, Abnormal Motor 0/5 in LUE and LLE, Abnormal FMC Tone  Hypotonia and Dysarthric Musc/Skel:  Extremity tender RIght knee, no erythema, reduced ROM Gen NAD   Assessment/Plan: 1. 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 Physiatrist is providing close team supervision and 24 hour management of active medical problems listed below. Physiatrist and rehab team continue to assess barriers to discharge/monitor patient progress toward functional and medical goals. FIM: Function - Bathing Position: Bed Body parts bathed by helper: Right arm, Left arm, Chest, Abdomen, Front perineal area, Buttocks, Right upper leg, Left upper leg, Right lower leg, Left lower leg, Back  Function- Upper Body Dressing/Undressing What is the patient wearing?: Hospital gown Assist Level: Touching or  steadying assistance(Pt > 75%), More than reasonable time Function - Lower Body Dressing/Undressing Lower body dressing/undressing activity did not occur: N/A What is the patient wearing?: Pants, Non-skid slipper socks Position: Bed Pants- Performed by helper: Thread/unthread right pants leg, Thread/unthread left pants leg, Pull pants up/down Non-skid slipper socks- Performed by helper: Don/doff right sock, Don/doff left sock Assist for footwear: Dependant Assist for lower body dressing: (total A)  Function - Toileting Toileting activity did not occur: Safety/medical concerns  Function - Air cabin crew transfer activity did not occur: Safety/medical concerns  Function - Chair/bed transfer Chair/bed transfer method: Squat pivot Chair/bed transfer assist level: Total assist (Pt < 25%) Chair/bed transfer assistive device: Bedrails Chair/bed transfer details: Manual facilitation for weight bearing, Manual facilitation for weight shifting, Manual facilitation for placement, Verbal cues for technique  Function - Locomotion: Wheelchair Will patient use wheelchair at discharge?: Yes Type: Manual Wheelchair activity did not occur: Safety/medical concerns Wheel 50 feet with 2 turns activity did not occur: Safety/medical concerns Wheel 150 feet activity did not occur: Safety/medical concerns Function - Locomotion: Ambulation Ambulation activity did not occur: Safety/medical concerns Walk 10 feet activity did not occur: Safety/medical concerns Walk 50 feet with 2 turns activity did not occur: Safety/medical concerns Walk 150 feet activity did not occur: Safety/medical concerns Walk 10 feet on uneven surfaces activity did not occur: Safety/medical concerns  Function - Comprehension Comprehension: Auditory Comprehension assist level: Understands basic 90% of the time/cues < 10% of the time  Function - Expression Expression: Verbal Expression assist level: Expresses basic 50 - 74%  of the time/requires cueing 25 - 49% of the time. Needs to repeat parts of sentences.  Function - Social Interaction Social Interaction assist level: Interacts appropriately 90% of the time - Needs monitoring or encouragement for participation or interaction.  Function - Problem Solving Problem solving assist level: Solves basic 75 - 89% of the time/requires cueing 10 - 24% of the time, Solves basic 90% of the time/requires cueing < 10% of the time  Function - Memory Memory assist level: Recognizes or recalls 90% of the time/requires cueing < 10% of the time Patient normally able to recall (first 3 days only): Current season, Location of own room, Staff names and faces, That he or she is in a hospital  Medical Problem List and Plan:  1. Functional deficits and left hemiparesis secondary to right MCA infarct, small right ACA infarct, posterior watershed infarct due to cardio-embolic source  -CIR PT, OT,  SLP 2. DVT Prophylaxis/Anticoagulation: Pharmaceutical: Lovenox  -to begin eliquis in 5-7 days  3. Pain Management: tylenol prn  4. Mood: LCSW to follow for evaluation and support  5. Neuropsych: This patient is not capable of making decisions on his own behalf.  6. Skin/Wound Care: routine pressure relief measures  7. Fluids/Electrolytes/Nutrition: monitor I/O. Offer liquids between meals to maintain adequate hydration. Change IVF to nights to avoid overload  8. CAD s/p Maze/ Chronic A fib: Monitor HR bid. On ASA--to start Eliquis in 5-7 days.  9. FUO: resolved, no leukocytosis, CXR no infiltrate, may have atelectasis will D/C Levaquin  10 GERD: Managed with Protonix  11. CKD stage II: Baseline SCr 1.2. Most recent 1.09- stable 12. HTN: Monitor BP bid--Coreg, Lotensin, Amlodipine and  Vitals:   07/16/17 1444 07/17/17 0216  BP: (!) 150/75 (!) 136/96  Pulse: 75 86  Resp: 17 16  Temp: 97.9 F (36.6 C) 99.1 F (37.3 C)  SpO2: 97% 98%   13. Malnutrition? Electrolyte abnormality:  Continue Phosphorous and potassium supplement. Recheck labs in am.  14. Irritation left eye: also appears to have conjunctivitis, add ciloxan gtts    LOS (Days) 2 A FACE TO FACE EVALUATION WAS PERFORMED  Charlett Blake 07/17/2017, 10:23 AM

## 2017-07-17 NOTE — Progress Notes (Signed)
Physical Therapy Session Note  Patient Details  Name: Arthur [name] MRN: [id number] Date of Birth: [birth year]  Today's Date: 07/17/2017 PT Individual Time: 8676-1950 PT Individual Time Calculation (min): 55 min   Short Term Goals: Week 1:  PT Short Term Goal 1 (Week 1): Pt will increase bed mobility to max A.  PT Short Term Goal 2 (Week 1): Pt will increase transfers bed to chair to max A.  PT Short Term Goal 3 (Week 1): Pt will ambulate with appropriate assistive device about 5 feet with +2 Assist PT Short Term Goal 4 (Week 1): Pt will ascend/descend 1 stair with 1 rail and max A.  PT Short Term Goal 5 (Week 1): Pt will propel w/c about 25 feet with min A.   Skilled Therapeutic Interventions/Progress Updates:    Pt supine in bed upon PT arrival, agreeable to therapy tx and reports pain in R knee with movement but does not rate. Pt transferred from supine>sitting EOB with max assist. Seated EOB pt worked on static sitting balance and midline orientation x 5 minutes requiring max assist to prevent L LOB. Pt transferred from bed>w/c using slideboard, total assist +2, verbal cues for techniques and manual facilitation for weight shifting. While seated in w/c pt worked on sitting balance and midline working on anterior leans and leaning to the R. Pt transferred back to bed total assist +2 using maximove. Pt performed rolling in each direction x 2 with max assist +2 in order to change brief and don pants. Pt left supine in bed at end of session with needs in reach and family present. Therapist exchanged pts w/c for a TIS w/c secondary to poor seated balance and postural control, will assess fit next session.   Therapy Documentation Precautions:  Precautions Precautions: Fall Precaution Comments: L inattention Restrictions Weight Bearing Restrictions: No   See Function Navigator for Current Functional Status.   Therapy/Group: Individual Therapy  Netta Corrigan, PT, DPT 07/17/2017, 7:56 AM

## 2017-07-18 ENCOUNTER — Inpatient Hospital Stay (HOSPITAL_COMMUNITY): Payer: Medicare Other | Admitting: Occupational Therapy

## 2017-07-18 ENCOUNTER — Inpatient Hospital Stay (HOSPITAL_COMMUNITY): Payer: Medicare Other | Admitting: Speech Pathology

## 2017-07-18 ENCOUNTER — Inpatient Hospital Stay (HOSPITAL_COMMUNITY): Payer: Medicare Other

## 2017-07-18 LAB — CBC
HEMATOCRIT: 40.2 % (ref 39.0–52.0)
Hemoglobin: 12.9 g/dL — ABNORMAL LOW (ref 13.0–17.0)
MCH: 30.1 pg (ref 26.0–34.0)
MCHC: 32.1 g/dL (ref 30.0–36.0)
MCV: 93.7 fL (ref 78.0–100.0)
Platelets: 276 10*3/uL (ref 150–400)
RBC: 4.29 MIL/uL (ref 4.22–5.81)
RDW: 14.4 % (ref 11.5–15.5)
WBC: 8.9 10*3/uL (ref 4.0–10.5)

## 2017-07-18 LAB — BASIC METABOLIC PANEL
Anion gap: 13 (ref 5–15)
BUN: 26 mg/dL — AB (ref 6–20)
CO2: 19 mmol/L — ABNORMAL LOW (ref 22–32)
Calcium: 8.9 mg/dL (ref 8.9–10.3)
Chloride: 114 mmol/L — ABNORMAL HIGH (ref 101–111)
Creatinine, Ser: 1.09 mg/dL (ref 0.61–1.24)
GFR calc Af Amer: >60 mL/min (ref >60)
GFR, EST NON AFRICAN AMERICAN: 59 mL/min — AB (ref >60)
GLUCOSE: 149 mg/dL — AB (ref 65–99)
POTASSIUM: 4 mmol/L (ref 3.5–5.1)
Sodium: 146 mmol/L — ABNORMAL HIGH (ref 135–145)

## 2017-07-18 MED ORDER — ENOXAPARIN SODIUM 40 MG/0.4ML ~~LOC~~ SOLN: 40.0000 mg | SUBCUTANEOUS | Status: DC

## 2017-07-18 MED ORDER — APIXABAN 5 MG PO TABS
5.0000 mg | ORAL_TABLET | Freq: Two times a day (BID) | ORAL | Status: DC
  Administered 2017-07-18 – 2017-08-03 (×32): 5 mg via ORAL
  Filled 2017-07-18 (×32): qty 1

## 2017-07-18 MED ORDER — CIPROFLOXACIN HCL 0.3 % OP SOLN
1.0000 [drp] | OPHTHALMIC | Status: DC
  Administered 2017-07-18 – 2017-07-25 (×30): 1 [drp] via OPHTHALMIC
  Filled 2017-07-18: qty 2.5

## 2017-07-18 NOTE — Progress Notes (Signed)
Social Work  Social Work Assessment and Plan  Patient Details  Name: Arthur [name] MRN: [id number] Date of Birth: 04-May-1929  Today's Date: 07/18/2017  Problem List:  Patient Active Problem List   Diagnosis Date Noted  . Acute ischemic right ACA stroke ([place]) 07/15/2017  . Left spastic hemiparesis ([place]) 07/13/2017  . Ischemic stroke ([place]) 07/11/2017  . Acute arterial ischemic stroke, multifocal, anterior circulation ([place]) 07/11/2017  . Acute respiratory failure ([place])   . Abnormal nuclear stress test 01/24/2015  . Encounter for therapeutic drug monitoring 07/19/2013  . Long term current use of anticoagulant 05/28/2011  . Dizziness 05/14/2011  . Hyperlipidemia   . Hypertension   . Coronary artery disease   . MVP (mitral valve prolapse)   . Paroxysmal atrial fibrillation (H[facility])   . Chronic renal insufficiency   . 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 [facility])    Past Medical History:  Past Medical History:  Diagnosis Date  . Arthritis    "some in my joints" (01/24/2015)  . CKD (chronic kidney disease), stage II   . Coronary artery disease    a. s/p CABG in 2005 with MV repair and MAZE procedure. b. s/p DES to prox RCA 01/2015.   Marland Kitchen CVA (cerebrovascular accident) [facility]) ~ 2014   RIGHT BRAIN; denies residual on 01/24/2015  . Esophagitis    Distal esophagitis  . GERD (gastroesophageal reflux disease)   . Heart murmur   . History of hiatal hernia   . History of recurrent TIAs   . Hyperlipidemia   . Hypertension   . Hypertensive vascular disease   . MVP (mitral valve prolapse)   . Nephrolithiasis   . Odynophagia   . PAF (paroxysmal atrial fibrillation) ([place])   . Transudative pleural effusion    Past Surgical History:  Past Surgical History:  Procedure Laterality Date  . APPENDECTOMY  1960's  . CARDIAC CATHETERIZATION  03/19/2004  . CARDIAC CATHETERIZATION N/A 01/24/2015   Procedure: Left Heart Cath and Coronary Angiography;  Surgeon: Peter M Martinique, MD;   Location: [facility];  Service: Cardiovascular;  Laterality: N/A;  . CARDIAC CATHETERIZATION N/A 01/24/2015   Procedure: Coronary Stent Intervention;  Surgeon: Peter M Martinique, MD;  Location: [facility];  Service: Cardiovascular;  Laterality: N/A;  . CORONARY ANGIOPLASTY WITH STENT PLACEMENT  01/24/2015   "1 stent"  . CORONARY ARTERY BYPASS GRAFT  04/2004   LIMA GRAFT TO THE DISTAL LAD, SAPHENOUS VEIN GRAFT TO THE FIRST DIADGONAL BRANCH, SAPHENOUS VEIN GRAFT TO THE THIRD MARIGINAL BRANCH, AND SAPHENOUS VEIN GRAFT TO THE PDA  . IR ANGIO VERTEBRAL SEL SUBCLAVIAN INNOMINATE UNI R MOD SED  07/11/2017  . IR PERCUTANEOUS ART THROMBECTOMY/INFUSION INTRACRANIAL INC DIAG ANGIO  07/11/2017  . MAZE  04/2004  . MITRAL VALVE REPAIR  04/2004  . RADIOLOGY WITH ANESTHESIA N/A 07/11/2017   Procedure: RADIOLOGY WITH ANESTHESIA;  Surgeon: Luanne Bras, MD;  Location: [facility];  Service: Radiology;  Laterality: N/A;  . TEE WITHOUT CARDIOVERSION  05/19/2011   Procedure: TRANSESOPHAGEAL ECHOCARDIOGRAM (TEE);  Surgeon: Peter Martinique, MD;  Location: [facility];  Service: Cardiovascular;  Laterality: N/A;  . TONSILLECTOMY AND ADENOIDECTOMY  1944   Social History:  reports that he quit smoking about 62 years ago. His smoking use included cigarettes. He quit after 3.00 years of use. he has never used smokeless tobacco. He reports that he does not drink alcohol or use drugs.  Family / Support Systems Marital Status: Married How Long?: 49 years  Patient Roles: Spouse, Other (Comment)(grandfather) Spouse/Significant Other: Izora Gala [phone] Other Supports: Two granddaughter's one in [place] and One in [place] Anticipated Caregiver: Wife and hired assist Ability/Limitations of Caregiver: Wife can provide supervision level but not physical assist. May need to hire assistance Caregiver Availability: Other (Comment)(Need to come up with a 24 hr care plan) Family Dynamics: Close with  their two grandaughter's who are out of town, their son is deceased. They have good freinds and neighbors who are willing to assist and provide whatever they need.   Social History Preferred language: English Religion: Presbyterian Cultural Background: No issues Education: [employer] Read: Yes Write: Yes Employment Status: Retired Date Retired/Disabled/Unemployed: Mudlogger Issues: No issues Guardian/Conservator: none-according to MD pt is not fully capable of making his own decisions while here, will look toward his wife to make any decisions while here   Abuse/Neglect Abuse/Neglect Assessment Can Be Completed: Yes Physical Abuse: Denies Verbal Abuse: Denies Sexual Abuse: Denies Exploitation of patient/patient's resources: Denies Self-Neglect: Denies  Emotional Status Pt's affect, behavior adn adjustment status: Pt is motivated to do well, he does have some premorbid health issues-bad right knee that has given trouble since [employer] football injury. He wants to recover and be as independent as he can before he leaves here. Wife is supportive and provides good emotional support to him. Recent Psychosocial Issues: other health issues did not get around well prior to admission with his bad knee Pyschiatric History: No history deferred depression screen due to pt tired form therapies and still adjusting to the rehab unit. Do feel he would benefit from seeing neuro-psych while here due to extensive stroke and he is aware to know his deficits. He may not be able to go home with wife unless they hire 24 hr care for him. Substance Abuse History: No issues  Patient / Family Perceptions, Expectations & Goals Pt/Family understanding of illness & functional limitations: Pt and wife have a basic understanding of his stroke and deficits. They are hopeful he will do well here and recover to go home from here. They do talk with the MD and are aware of the rehab process.  Will wait and see his progress while here. Premorbid pt/family roles/activities: husband, grandfather, retiree, Librarian, academic for [employer], church member, etc Anticipated changes in roles/activities/participation: resume Pt/family expectations/goals: Pt states: " I want to do well, this is something."  Wife states: " I hope he does well here and improves."  [employer]: Other (Comment)(Cardiac rehab in the past) Premorbid Home Care/DME Agencies: None Transportation available at discharge: Wife drives Resource referrals recommended: Neuropsychology, Support group (specify)  Discharge Planning Living Arrangements: Spouse/significant other Support Systems: Spouse/significant other, Other relatives, Water engineer, Social worker community Type of Residence: Private residence Insurance Resources: Multimedia programmer (specify)([employer]) Financial Resources: Social Security, Family Support Financial Screen Referred: No Living Expenses: Own Money Management: Patient, Spouse Does the patient have any problems obtaining your medications?: No Home Management: Both wife does the home management, pt did the outside work Patient/Family Preliminary Plans: Depends upon how much progress pt makes while here and what his care needs are. Informed both pt will require 24 hr physical assist at discharge from rehab. Wife is not able to provide physical assist due to age and small statue. Discussed hired assist and short term NHP. Will await progress and work on the best plan for pt. Sw Barriers to Discharge: Decreased caregiver support Sw Barriers to Discharge Comments: Wife can not provide physical care  upon discharge Social Work Anticipated Follow Up Needs: HH/OP, SNF, Support Group  Clinical Impression Pleasant tearful gentleman who is aware of his stroke and deficits. His wife is supportive and plans to be here daily to see his progress. They have granddaughter's both out of  state but are supportive. Will work on a safe plan for him, made aware he will require 24 hr physical care upon discharge from rehab. Wife to see how much assist others are willing to provide to them and get back with this worker. Would benefit from seeing neuro-psych for coping during his stay.  Elease Hashimoto 07/18/2017, 2:51 PM

## 2017-07-18 NOTE — Progress Notes (Signed)
Subjective/Complaints: Good fluid and caloric intake this am  ROS- no CP, SOB, N/V/D  Objective: Vital Signs: Blood pressure (!) 144/83, pulse 88, temperature 98.9 F (37.2 C), temperature source Oral, resp. rate 18, height _0  (1.702 m), weight 79.6 kg (175 lb 7.8 oz), SpO2 98 %. No results found. Results for orders placed or performed during the hospital encounter of 07/15/17 (from the past 72 hour(s))  CBC WITH DIFFERENTIAL     Status: Abnormal   Collection Time: 07/16/17  6:22 AM  Result Value Ref Range   WBC 9.5 4.0 - 10.5 K/uL   RBC 4.28 4.22 - 5.81 MIL/uL   Hemoglobin 12.8 (L) 13.0 - 17.0 g/dL   HCT 39.3 39.0 - 52.0 %   MCV 91.8 78.0 - 100.0 fL   MCH 29.9 26.0 - 34.0 pg   MCHC 32.6 30.0 - 36.0 g/dL   RDW 14.0 11.5 - 15.5 %   Platelets 216 150 - 400 K/uL   Neutrophils Relative % 79 %   Neutro Abs 7.5 1.7 - 7.7 K/uL   Lymphocytes Relative 7 %   Lymphs Abs 0.7 0.7 - 4.0 K/uL   Monocytes Relative 13 %   Monocytes Absolute 1.2 (H) 0.1 - 1.0 K/uL   Eosinophils Relative 1 %   Eosinophils Absolute 0.1 0.0 - 0.7 K/uL   Basophils Relative 0 %   Basophils Absolute 0.0 0.0 - 0.1 K/uL    Comment: Performed at [facility], [address] [place], [place] [address]  Comprehensive metabolic panel     Status: Abnormal   Collection Time: 07/16/17  6:22 AM  Result Value Ref Range   Sodium 143 135 - 145 mmol/L   Potassium 3.8 3.5 - 5.1 mmol/L   Chloride 113 (H) 101 - 111 mmol/L   CO2 21 (L) 22 - 32 mmol/L   Glucose, Bld 117 (H) 65 - 99 mg/dL   BUN 18 6 - 20 mg/dL   Creatinine, Ser 1.09 0.61 - 1.24 mg/dL   Calcium 8.7 (L) 8.9 - 10.3 mg/dL   Total Protein 5.8 (L) 6.5 - 8.1 g/dL   Albumin 2.7 (L) 3.5 - 5.0 g/dL   AST 49 (H) 15 - 41 U/L   ALT 33 17 - 63 U/L   Alkaline Phosphatase 49 38 - 126 U/L   Total Bilirubin 0.9 0.3 - 1.2 mg/dL   GFR calc non Af Amer 59 (L) >60 mL/min   GFR calc Af Amer >60 >60 mL/min    Comment: (NOTE) The eGFR has been calculated using the CKD  EPI equation. This calculation has not been validated in all clinical situations. eGFR's persistently <60 mL/min signify possible Chronic Kidney Disease.    Anion gap 9 5 - 15    Comment: Performed at [facility] [address], [place], [place] [address]  Magnesium     Status: None   Collection Time: 07/16/17  6:22 AM  Result Value Ref Range   Magnesium 1.9 1.7 - 2.4 mg/dL    Comment: Performed at [facility] [address] [place], [place] [address]  Phosphorus     Status: None   Collection Time: 07/16/17  6:22 AM  Result Value Ref Range   Phosphorus 2.8 2.5 - 4.6 mg/dL    Comment: Performed at [facility] [address], [place], [place] [address]     HEENT: left eye less injected , minimal mucus, left facial droop Cardio: RRR and no murmur  Resp: CTA B/L and unlabored GI: BS positive and NT, ND Extremity:  No Edema Skin:   Other ecchymosis Left forearm Neuro: Alert/Oriented, Flat, Abnormal Motor 0/5 in LUE and LLE, Abnormal FMC Tone  Hypotonia and Dysarthric Musc/Skel:  Extremity tender RIght knee, no erythema, reduced ROM Gen NAD   Assessment/Plan: 1. 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 Physiatrist is providing close team supervision and 24 hour management of active medical problems listed below. Physiatrist and rehab team continue to assess barriers to discharge/monitor patient progress toward functional and medical goals. FIM: Function - Bathing Position: Bed Body parts bathed by helper: Right arm, Left arm, Chest, Abdomen, Front perineal area, Buttocks, Right upper leg, Left upper leg, Right lower leg, Left lower leg, Back  Function- Upper Body Dressing/Undressing What is the patient wearing?: Hospital gown Assist Level: Touching or steadying  assistance(Pt > 75%), More than reasonable time Function - Lower Body Dressing/Undressing Lower body dressing/undressing activity did not occur: N/A What is the patient wearing?: Pants, Non-skid slipper socks Position: Bed Pants- Performed by helper: Thread/unthread right pants leg, Thread/unthread left pants leg, Pull pants up/down Non-skid slipper socks- Performed by helper: Don/doff right sock, Don/doff left sock Assist for footwear: Dependant Assist for lower body dressing: (total A)  Function - Toileting Toileting activity did not occur: Safety/medical concerns  Function - Air cabin crew transfer activity did not occur: Safety/medical concerns  Function - Chair/bed transfer Chair/bed transfer method: Lateral scoot, Other Chair/bed transfer assist level: 2 helpers Chair/bed transfer assistive device: Sliding board, Armrests, Mechanical lift Mechanical lift: Maximove Chair/bed transfer details: Manual facilitation for weight bearing, Manual facilitation for weight shifting, Manual facilitation for placement, Verbal cues for technique  Function - Locomotion: Wheelchair Will patient use wheelchair at discharge?: Yes Type: Manual Wheelchair activity did not occur: Safety/medical concerns Wheel 50 feet with 2 turns activity did not occur: Safety/medical concerns Wheel 150 feet activity did not occur: Safety/medical concerns Function - Locomotion: Ambulation Ambulation activity did not occur: Safety/medical concerns Walk 10 feet activity did not occur: Safety/medical concerns Walk 50 feet with 2 turns activity did not occur: Safety/medical concerns Walk 150 feet activity did not occur: Safety/medical concerns Walk 10 feet on uneven surfaces activity did not occur: Safety/medical concerns  Function - Comprehension Comprehension: Auditory Comprehension assist level: Understands basic 90% of the time/cues < 10% of the time  Function - Expression Expression:  Verbal Expression assist level: Expresses basic 50 - 74% of the time/requires cueing 25 - 49% of the time. Needs to repeat parts of sentences.  Function - Social Interaction Social Interaction assist level: Interacts appropriately 90% of the time - Needs monitoring or encouragement for participation or interaction.  Function - Problem Solving Problem solving assist level: Solves basic 75 - 89% of the time/requires cueing 10 - 24% of the time, Solves basic 90% of the time/requires cueing < 10% of the time  Function - Memory Memory assist level: Recognizes or recalls 90% of the time/requires cueing < 10% of the time Patient normally able to recall (first 3 days only): Current season, Location of own room, Staff names and faces, That he or she is in a hospital  Medical Problem List and Plan:  1. Functional deficits and left hemiparesis secondary to right MCA infarct, small right ACA infarct, posterior watershed infarct due to cardio-embolic source  -  CIR PT, OT, SLP 2. DVT Prophylaxis/Anticoagulation: Pharmaceutical: Lovenox  -to begin eliquis in 5-7 days  3. Pain Management: tylenol prn  4. Mood: LCSW to follow for evaluation and support  5. Neuropsych: This patient is not capable of making decisions on his own behalf.  6. Skin/Wound Care: routine pressure relief measures  7. Fluids/Electrolytes/Nutrition: monitor I/O. Offer liquids between meals to maintain adequate hydration. Change IVF to nights to avoid overload  8. CAD s/p Maze/ Chronic A fib: Monitor HR bid. On ASA--to start Eliquis in 5-7 days. Around 2/6 9. FUO: resolved,no leukocytosis, CXR no infiltrate, may have atelectasis Remains afebrile off Levaquin  10 GERD: Managed with Protonix  11. CKD stage II: Baseline SCr 1.2. Most recent 1.09- stable 12. HTN: Monitor BP bid--Coreg, Lotensin, Amlodipine and  Vitals:   07/18/17 0035 07/18/17 0842  BP: (!) 148/75 (!) 144/83  Pulse: 85 88  Resp: 16 18  Temp: 97.9 F (36.6 C) 98.9 F  (37.2 C)  SpO2: 98% 98%    14. Irritation left VEZ:[id number] with  ciloxan gtts    LOS (Days) 3 A FACE TO FACE EVALUATION WAS PERFORMED  Charlett Blake 07/18/2017, 8:59 AM

## 2017-07-18 NOTE — Care Management Note (Signed)
Inpatient [facility] Individual Statement of Services  Patient Name:  Arthur [name]  Date:  07/18/2017  Welcome to the [facility].  Our goal is to provide you with an individualized program based on your diagnosis and situation, designed to meet your specific needs.  With this comprehensive rehabilitation program, you will be expected to participate in at least 3 hours of rehabilitation therapies Monday-Friday, with modified therapy programming on the weekends.  Your rehabilitation program will include the following services:  Physical Therapy (PT), Occupational Therapy (OT), Speech Therapy (ST), 24 hour per day rehabilitation nursing, Therapeutic Recreaction (TR), Neuropsychology, Case Management (Social Worker), Rehabilitation Medicine, Nutrition Services and Pharmacy Services  Weekly team conferences will be held on Wednesday to discuss your progress.  Your Social Worker will talk with you frequently to get your input and to update you on team discussions.  Team conferences with you and your family in attendance may also be held.  Expected length of stay: 23-25 days  Overall anticipated outcome: min-mod assist  Depending on your progress and recovery, your program may change. Your Social Worker will coordinate services and will keep you informed of any changes. Your Social Worker's name and contact numbers are listed  below.  The following services may also be recommended but are not provided by the [facility] will be made to provide these services after discharge if needed.  Arrangements include referral to agencies that provide these services.  Your insurance has been verified to be:  [employer] Your primary doctor is:  Lennette Bihari Little  Pertinent information will be shared with your doctor and your insurance  company.  Social Worker:  Ovidio Kin, [place] or ([phone]  Information discussed with and copy given to patient by: Elease Hashimoto, 07/18/2017, 2:32 PM

## 2017-07-18 NOTE — Progress Notes (Signed)
Patient information reviewed and entered into eRehab system by Matvey Llanas, RN, CRRN, PPS Coordinator.  Information including medical coding and functional independence measure will be reviewed and updated through discharge.     Per nursing patient was given "Data Collection Information Summary for Patients in Inpatient Rehabilitation Facilities with attached "Privacy Act Statement-Health Care Records" upon admission.  

## 2017-07-18 NOTE — Progress Notes (Signed)
Occupational Therapy Session Note  Patient Details  Name: Arthur [name] MRN: [id number] Date of Birth: 01/24/1929  Today's Date: 07/18/2017 OT Individual Time: 1300-1415 OT Individual Time Calculation (min): 75 min   Short Term Goals: Week 1:  OT Short Term Goal 1 (Week 1): Pt will be able to roll in bed with mod A to assist caregivers with clothing changes. OT Short Term Goal 2 (Week 1): Pt will be able to sit to EOB with mod A. OT Short Term Goal 3 (Week 1): Pt will be able to maintain static sit EOB for 5 min with min A. OT Short Term Goal 4 (Week 1): Pt will be able to wash UB with min A. OT Short Term Goal 5 (Week 1): Pt will visually scan to the L to find his arm to use his R arm to position it in bed.   Skilled Therapeutic Interventions/Progress Updates:    NMR-ed on L UE using UE ranger in wc, then progressed to table top towel pushes with OT facilitating movement. No muscle activation noted at this time. Incorporated L attention by having pt watch L UE and count reps. L attention, visual scanning, and trunk control with dynavision activity. Pt needed mod cues to scan to locate lights on L side and mod A to turn on trunk and lean forward to hit buttons. Graded peg board puzzle focused on visuospatial skills and L attention. 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 OT provided pt with full lap tray for flaccid L UE support in TIS wc. Pt returned to room and left seated in TIS wc with spouse present and needs met.   Therapy Documentation Precautions:  Precautions Precautions: Fall Precaution Comments: L inattention Restrictions Weight Bearing Restrictions: No Pain:  none/denies pain ADL: ADL ADL Comments: refer to functional navigator  See Function Navigator for Current Functional Status.   Therapy/Group: Individual Therapy   S  07/18/2017, 2:19 PM 

## 2017-07-18 NOTE — Progress Notes (Signed)
Speech Language Pathology Daily Session Note  Patient Details  Name: Arthur [name] MRN: [id number] Date of Birth: 27-Dec-1928  Today's Date: 07/18/2017 SLP Individual Time: 2458-0998 SLP Individual Time Calculation (min): 60 min  Short Term Goals: Week 1: SLP Short Term Goal 1 (Week 1): Pt will complete complex problem solving tasks in timely manner with supervision cues.  SLP Short Term Goal 2 (Week 1): Pt will utilize speech intelligibility strategies to achieve ~ 75% intelligibility at the phrase level.  SLP Short Term Goal 3 (Week 1): Pt will consume dysphagia 1 diet iwth necatr thick liquids iwthoutovert s/s of aspiraiton and Min A cues for use of compensatory swallow strategies.  SLP Short Term Goal 4 (Week 1): Pt will consume trials of ice chips without overt s/s of aspiration to demonstrate readiness for repeat instrumental study.  SLP Short Term Goal 5 (Week 1): Pt will complete 3 set sof 10 CTAR before fatigue.   Skilled Therapeutic Interventions: Skilled treatment session focused on dysphagia and cognition goals. SLP facilitated session by providing skilled observation of pt consuming ice chips and thin water via cup sips. Pt with min overt s/s of aspiration (2 throat clears within 8 oz). Order requested for water protocol. Education provided to wife on swallow deficits but wife doesn't demonstrate understanding to be safe performing supervision during meals or water protocol. Pt able to complete 2 CTARs per set for 3 sets. Pt required Mod A to demonstrate selective attention in moderately distracting environment. He required Max A for task initiation to comb hair and Total A to turn head to left of environment. Pt was returned to room, left upright in wheelchair with full lap tray in place and wife present.      Function:  Eating Eating   Modified Consistency Diet: No(Trials of thins with liquids) Eating Assist Level: More than reasonable amount of time            Cognition Comprehension Comprehension assist level: Understands basic 90% of the time/cues < 10% of the time  Expression   Expression assist level: Expresses basic 50 - 74% of the time/requires cueing 25 - 49% of the time. Needs to repeat parts of sentences.  Social Interaction Social Interaction assist level: Interacts appropriately 75 - 89% of the time - Needs redirection for appropriate language or to initiate interaction.  Problem Solving Problem solving assist level: Solves basic 75 - 89% of the time/requires cueing 10 - 24% of the time;Solves basic 90% of the time/requires cueing < 10% of the time  Memory Memory assist level: Recognizes or recalls 90% of the time/requires cueing < 10% of the time    Pain    Therapy/Group: Individual Therapy  Stiles Maxcy 07/18/2017, 4:04 PM

## 2017-07-18 NOTE — Plan of Care (Signed)
  Progressing Consults RH STROKE PATIENT EDUCATION Description See Patient Education module for education specifics  07/18/2017 1330 - Progressing by Brita Romp, RN [facility] STROKE PATIENT EDUCATION Description See Patient Education module for education specifics  07/18/2017 1330 - Progressing by Brita Romp, RN RH BOWEL ELIMINATION RH STG MANAGE BOWEL WITH ASSISTANCE Description STG Manage Bowel with [place].  07/18/2017 1330 - Progressing by Brita Romp, RN RH STG MANAGE BOWEL W/MEDICATION W/ASSISTANCE Description STG Manage Bowel with Medication with mod Assistance.  07/18/2017 1330 - Progressing by Brita Romp, RN RH BLADDER ELIMINATION RH STG MANAGE BLADDER WITH ASSISTANCE Description STG Manage Bladder With mod Assistance  07/18/2017 1330 - Progressing by Brita Romp, RN RH SKIN INTEGRITY RH STG SKIN FREE OF INFECTION/BREAKDOWN Description No new breakdown with mod  assist    07/18/2017 1330 - Progressing by Brita Romp, RN RH SAFETY RH STG ADHERE TO SAFETY PRECAUTIONS W/ASSISTANCE/DEVICE Description STG Adhere to Safety Precautions With Min Assistance/Device.  07/18/2017 1330 - Progressing by Brita Romp, RN   Not Applicable RH SKIN INTEGRITY RH STG ABLE TO PERFORM INCISION/WOUND CARE W/ASSISTANCE Description STG Able To Perform Incision/Wound Care With mod Assistance.  02/13/5055 9794 - Not Applicable by Brita Romp, RN

## 2017-07-18 NOTE — Progress Notes (Addendum)
Physical Therapy Note  Patient Details  Name: Arthur [name] MRN: [id number] Date of Birth: [birth year] Today's Date: 07/18/2017  1100-1205, 65 min individual tx Pain: none at rest, but R knee painful with active movememt, due to OA.    Pt reported that his R knee has been a problem since a high school football injury, and he has not been able to put his R foot flat on the ground to ambulate sine that time.  R knee noted to be enlarged.   In supine in bed, neuromuscular re-education via multimodal cues for LLE hip flex/ext facilitation in supine; slight mass extension felt 3/10 attempt; bil bridging with assistance, R straight leg raises, x 10 x 1; R ankle pumps 10 x 2.   PROM L hip, knee, and ankle. Seated in w/c, mod/max cues for L visual attention to PT for conversing. Pt became tearful when PT asked about his family.  PT provided emotional support and education about lability after CVa.   Pt noted to be wet . +2 for rolling and bed mobility for hygiene change and donning dry brief and pj bottoms.  Use of MaxiMove +2 for bed> tilt- in- space w/c, with pt holding LUE safely with RUE, given 1 cue.   Pt remembered room number after 5 minutes, and PT's name after 50 minutes. Pt tolerated being up in w/c x 20 minutes at 15 degree tilt, without c/o.   Pt left resting in w/c with quick release belt applied and all needs within reach, tilted back for comfort.  See function navigator for current status.   Arthur [name] 07/18/2017, 7:58 AM

## 2017-07-19 ENCOUNTER — Inpatient Hospital Stay (HOSPITAL_COMMUNITY): Payer: Medicare Other | Admitting: Physical Therapy

## 2017-07-19 ENCOUNTER — Inpatient Hospital Stay (HOSPITAL_COMMUNITY): Payer: Medicare Other | Admitting: *Deleted

## 2017-07-19 ENCOUNTER — Inpatient Hospital Stay (HOSPITAL_COMMUNITY): Payer: Medicare Other

## 2017-07-19 MED ORDER — AMLODIPINE BESYLATE 5 MG PO TABS
5.0000 mg | ORAL_TABLET | Freq: Every day | ORAL | Status: DC
  Administered 2017-07-19 – 2017-07-21 (×3): 5 mg via ORAL
  Filled 2017-07-19 (×3): qty 1

## 2017-07-19 NOTE — Plan of Care (Signed)
  Progressing Consults RH STROKE PATIENT EDUCATION Description See Patient Education module for education specifics  07/19/2017 1617 - Progressing by Brita Romp, RN [facility] STROKE PATIENT EDUCATION Description See Patient Education module for education specifics  07/19/2017 1617 - Progressing by Brita Romp, RN RH BLADDER ELIMINATION RH STG MANAGE BLADDER WITH ASSISTANCE Description STG Manage Bladder With mod Assistance  07/19/2017 1617 - Progressing by Brita Romp, RN RH SKIN INTEGRITY RH STG SKIN FREE OF INFECTION/BREAKDOWN Description No new breakdown with mod  assist    07/19/2017 1617 - Progressing by Brita Romp, RN RH SAFETY RH STG ADHERE TO SAFETY PRECAUTIONS W/ASSISTANCE/DEVICE Description STG Adhere to Safety Precautions With Min Assistance/Device.  07/19/2017 1617 - Progressing by Brita Romp, RN   Not Progressing RH BOWEL ELIMINATION RH STG MANAGE BOWEL WITH ASSISTANCE Description STG Manage Bowel with Mod Assistance.  07/19/2017 1617 - Not Progressing by Brita Romp, RN Note Required suppository due to frequent smearing only RH STG MANAGE BOWEL W/MEDICATION W/ASSISTANCE Description STG Manage Bowel with Medication with mod Assistance.  07/19/2017 1617 - Not Progressing by Brita Romp, RN

## 2017-07-19 NOTE — Progress Notes (Signed)
Physical Therapy Session Note  Patient Details  Name: Arthur [name] MRN: [id number] Date of Birth: 20-Oct-1928  Today's Date: 07/19/2017 PT Individual Time: 1415-1530 PT Individual Time Calculation (min): 75 min   Short Term Goals: Week 1:  PT Short Term Goal 1 (Week 1): Pt will increase bed mobility to max A.  PT Short Term Goal 2 (Week 1): Pt will increase transfers bed to chair to max A.  PT Short Term Goal 3 (Week 1): Pt will ambulate with appropriate assistive device about 5 feet with +2 Assist PT Short Term Goal 4 (Week 1): Pt will ascend/descend 1 stair with 1 rail and max A.  PT Short Term Goal 5 (Week 1): Pt will propel w/c about 25 feet with min A.   Skilled Therapeutic Interventions/Progress Updates:    no c/o pain.  Session focus on attention during self feeding, standing tolerance/posture, sitting balance/midline orientation, and activity tolerance.  Pt with strong push to L in sitting and standing requiring max multimodal cues and frequent reminders to correct.    Pt finishing lunch tray on arrival, wife present.  Pt requires max cues to attend to self feeding task due to distractions from wife and nursing student in room.  PT provided education on reducing environmental distractions when pt attempting to complete any task, including self feeding.    Sit<>stand in standing frame x4 minutes for extended weight bearing, focus on upright and midline posture with mod multimodal cues.   Squat/pivot to R to therapy mat with total assist +1.  On therapy mat pt requires up to max assist to maintain static sitting balance.  Engaged in dynamic reaching task to R, focus on maintaining upright posture and trunk shortening L/lengthening R with min assist at best, consistent mod assist.  Occasional rest breaks and pt emotional this session.  Reminded pt that it was normal to be more emotional following a stroke, and provided emotional support.  Pt completed stand/pivot with 3 musketeers back to  w/c on R.  Returned to room and positioned in tilt in space with call bell in reach and needs met.   Therapy Documentation Precautions:  Precautions Precautions: Fall Precaution Comments: L inattention Restrictions Weight Bearing Restrictions: No   See Function Navigator for Current Functional Status.   Therapy/Group: Individual Therapy  Michel Santee 07/19/2017, 4:52 PM

## 2017-07-19 NOTE — Progress Notes (Signed)
Patient consumed approximately 8 oz of thin water with one mild throat clear after performing oral care per water protocol.  Patient felt his thirst was satisfied and enjoys drinking water.  Brita Romp, RN

## 2017-07-19 NOTE — Progress Notes (Signed)
Speech Language Pathology Daily Session Note  Patient Details  Name: Arthur [name] MRN: [id number] Date of Birth: 1928-12-30  Today's Date: 07/19/2017 SLP Individual Time: 1100-1200 SLP Individual Time Calculation (min): 60 min  Short Term Goals: Week 1: SLP Short Term Goal 1 (Week 1): Pt will complete complex problem solving tasks in timely manner with supervision cues.  SLP Short Term Goal 2 (Week 1): Pt will utilize speech intelligibility strategies to achieve ~ 75% intelligibility at the phrase level.  SLP Short Term Goal 3 (Week 1): Pt will consume dysphagia 1 diet iwth necatr thick liquids iwthoutovert s/s of aspiraiton and Min A cues for use of compensatory swallow strategies.  SLP Short Term Goal 4 (Week 1): Pt will consume trials of ice chips without overt s/s of aspiration to demonstrate readiness for repeat instrumental study.  SLP Short Term Goal 5 (Week 1): Pt will complete 3 set sof 10 CTAR before fatigue.   Skilled Therapeutic Interventions: Skilled ST services focused on swallow and speech skills. Pt was asleep upon entering room and required mod A verbal cues to arousal. Pt agreed to participate in oral care and trials of thin liquids. SLP positioned pt in bed to degrees and provided oral care via suction toothbrush piror to trials of thin liquids. Pt consumed ice chips and then via liquids via controled cup sips requiring Min A verbal cues to take small sips.Pt demonstrated 1 cough during consumption. Of 8oz when attempting to talk, SLP apologized for attempting communication and educated pt to cease verbalization until swallow is complete. Pt required min A verbal cues fading to Mod I in response to familiar Wh Q, possibly due to recently being asleep. Pt required min A verbal cues when asked hospital specific safety problem solving questions.Pt demonstrated intelligibility at the sentences lvel with Min A verbal cues. Pt was left in room with call bell within reach. Recommend to  continue skilled ST services.       Function:  Eating Eating   Modified Consistency Diet: No(trials of thin) Eating Assist Level: More than reasonable amount of time           Cognition Comprehension Comprehension assist level: Understands basic 90% of the time/cues < 10% of the time  Expression   Expression assist level: Expresses basic 50 - 74% of the time/requires cueing 25 - 49% of the time. Needs to repeat parts of sentences.  Social Interaction Social Interaction assist level: Interacts appropriately 75 - 89% of the time - Needs redirection for appropriate language or to initiate interaction.  Problem Solving Problem solving assist level: Solves basic 75 - 89% of the time/requires cueing 10 - 24% of the time;Solves basic 90% of the time/requires cueing < 10% of the time  Memory Memory assist level: Recognizes or recalls 90% of the time/requires cueing < 10% of the time    Pain Pain Assessment Pain Assessment: No/denies pain Pain Score: 0-No pain  Therapy/Group: Individual Therapy  Jaysha Lasure  [facility] 07/19/2017, 12:32 PM

## 2017-07-19 NOTE — Progress Notes (Signed)
Subjective/Complaints: Appears tired this am but no new c/os.  Nursing notes increasing BPs over the last 2 d  ROS- no CP, SOB, N/V/D  Objective: Vital Signs: Blood pressure (!) 180/98, pulse 83, temperature 97.6 F (36.4 C), temperature source Oral, resp. rate 18, height 5' 7"  (1.702 m), weight 79.6 kg (175 lb 7.8 oz), SpO2 96 %.  Results for orders placed or performed during the hospital encounter of 07/15/17 (from the past 72 hour(s))  Basic metabolic panel     Status: Abnormal   Collection Time: 07/18/17  8:04 AM  Result Value Ref Range   Sodium 146 (H) 135 - 145 mmol/L   Potassium 4.0 3.5 - 5.1 mmol/L   Chloride 114 (H) 101 - 111 mmol/L   CO2 19 (L) 22 - 32 mmol/L   Glucose, Bld 149 (H) 65 - 99 mg/dL   BUN 26 (H) 6 - 20 mg/dL   Creatinine, Ser 1.09 0.61 - 1.24 mg/dL   Calcium 8.9 8.9 - 10.3 mg/dL   GFR calc non Af Amer 59 (L) >60 mL/min   GFR calc Af Amer >60 >60 mL/min    Comment: (NOTE) The eGFR has been calculated using the CKD EPI equation. This calculation has not been validated in all clinical situations. eGFR's persistently <60 mL/min signify possible Chronic Kidney Disease.    Anion gap 13 5 - 15    Comment: Performed at [facility] [address], [place], [place] [address]  CBC     Status: Abnormal   Collection Time: 07/18/17  8:04 AM  Result Value Ref Range   WBC 8.9 4.0 - 10.5 K/uL   RBC 4.29 4.22 - 5.81 MIL/uL   Hemoglobin 12.9 (L) 13.0 - 17.0 g/dL   HCT 40.2 39.0 - 52.0 %   MCV 93.7 78.0 - 100.0 fL   MCH 30.1 26.0 - 34.0 pg   MCHC 32.1 30.0 - 36.0 g/dL   RDW 14.4 11.5 - 15.5 %   Platelets 276 150 - 400 K/uL    Comment: Performed at [facility], [place] [address], [place], [place] [address]     HEENT: left eye less injected , minimal mucus, left facial droop Cardio: RRR and no murmur Resp: CTA B/L and unlabored GI: BS positive and NT, ND Extremity:  No Edema, no calf or thigh tenderness, no pain with ROM Skin:   Other ecchymosis Left  forearm Neuro: Alert/Oriented, Flat, Abnormal Motor 0/5 in LUE and LLE, Abnormal FMC Tone  Hypotonia and Dysarthric Musc/Skel:  Extremity tender RIght knee, no erythema, reduced ROM Gen NAD   Assessment/Plan: 1. 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 Physiatrist is providing close team supervision and 24 hour management of active medical problems listed below. Physiatrist and rehab team continue to assess barriers to discharge/monitor patient progress toward functional and medical goals. FIM: Function - Bathing Position: Bed Body parts bathed by helper: Right arm, Left arm, Chest, Abdomen, Front perineal area, Buttocks, Right upper leg, Left upper leg, Right lower leg, Left lower leg, Back  Function- Upper Body Dressing/Undressing What is the patient wearing?: Hospital gown Assist Level: Touching or steadying assistance(Pt > 75%), More than reasonable time Function - Lower Body Dressing/Undressing Lower body dressing/undressing activity did not occur: N/A What is the patient wearing?: Pants, Non-skid slipper socks Position: Bed  Pants- Performed by helper: Thread/unthread right pants leg, Thread/unthread left pants leg, Pull pants up/down Non-skid slipper socks- Performed by helper: Don/doff right sock, Don/doff left sock Assist for footwear: Dependant Assist for lower body dressing: (total A)  Function - Toileting Toileting activity did not occur: Safety/medical concerns Toileting steps completed by helper: Adjust clothing prior to toileting, Performs perineal hygiene, Adjust clothing after toileting(per [place], NT report) Assist level: Two helpers(per [place], NT report)  Function - Air cabin crew transfer activity did not occur: Safety/medical concerns Toilet  transfer assistive device: Mechanical lift(per Candice, NT report) Assist level to toilet: 2 helpers(per Candice, NT report)  Function - Chair/bed transfer Chair/bed transfer method: Lateral scoot, Other Chair/bed transfer assist level: 2 helpers Chair/bed transfer assistive device: Mechanical lift Mechanical lift: Maximove Chair/bed transfer details: Manual facilitation for weight bearing, Manual facilitation for weight shifting, Manual facilitation for placement, Verbal cues for technique, Verbal cues for precautions/safety  Function - Locomotion: Wheelchair Will patient use wheelchair at discharge?: Yes Type: Manual Wheelchair activity did not occur: Safety/medical concerns Wheel 50 feet with 2 turns activity did not occur: Safety/medical concerns Wheel 150 feet activity did not occur: Safety/medical concerns Function - Locomotion: Ambulation Ambulation activity did not occur: Safety/medical concerns Walk 10 feet activity did not occur: Safety/medical concerns Walk 50 feet with 2 turns activity did not occur: Safety/medical concerns Walk 150 feet activity did not occur: Safety/medical concerns Walk 10 feet on uneven surfaces activity did not occur: Safety/medical concerns  Function - Comprehension Comprehension: Auditory Comprehension assist level: Understands basic 90% of the time/cues < 10% of the time  Function - Expression Expression: Verbal Expression assist level: Expresses basic 50 - 74% of the time/requires cueing 25 - 49% of the time. Needs to repeat parts of sentences.  Function - Social Interaction Social Interaction assist level: Interacts appropriately 75 - 89% of the time - Needs redirection for appropriate language or to initiate interaction.  Function - Problem Solving Problem solving assist level: Solves basic 75 - 89% of the time/requires cueing 10 - 24% of the time, Solves basic 90% of the time/requires cueing < 10% of the time  Function - Memory Memory  assist level: Recognizes or recalls 90% of the time/requires cueing < 10% of the time Patient normally able to recall (first 3 days only): Current season, Location of own room, Staff names and faces, That he or she is in a hospital  Medical Problem List and Plan:  1. Functional deficits and left hemiparesis secondary to right MCA infarct, small right ACA infarct, posterior watershed infarct due to cardio-embolic source  -CIR PT, OT, SLP 2. DVT Prophylaxis/Anticoagulation: Pharmaceutical: Lovenox  -to begin eliquis in 2-3 days  3. Pain Management: tylenol prn  4. Mood: LCSW to follow for evaluation and support  5. Neuropsych: This patient is not capable of making decisions on his own behalf.  6. Skin/Wound Care: routine pressure relief measures  7. Fluids/Electrolytes/Nutrition: monitor I/O. Offer liquids between meals to maintain adequate hydration. Change IVF to nights to avoid overload  8. CAD s/p Maze/ Chronic A fib: Monitor HR bid. On ASA--to start Eliquis around 2/6 9. FUO: resolved,no leukocytosis, CXR no infiltrate, may have atelectasis Remains afebrile off Levaquin  10 GERD: Managed with Protonix  11. CKD stage II: Baseline SCr 1.2. Most recent 1.09- stable 12. HTN: Monitor BP bid--Home medsCoreg, Lotensin, Amlodipine  Vitals:   07/19/17 0813 07/19/17 0821  BP: (!) 191/97 (!) 180/98  Pulse: 83   Resp:  Temp:    SpO2:    Resume amlodipine 65m today  14. Irritation left eYLU:DAPTCKFWwith  ciloxan gtts    LOS (Days) 4 A FACE TO FACE EVALUATION WAS PERFORMED  ACharlett Blake2/10/2017, 9:42 AM

## 2017-07-19 NOTE — Plan of Care (Signed)
  Progressing Consults [facility] STROKE PATIENT EDUCATION Description See Patient Education module for education specifics  07/19/2017 0302 - Progressing by Evelena Asa, RN 10/15/6142 3154 - Progressing by Evelena Asa, RN [facility] STROKE PATIENT EDUCATION Description See Patient Education module for education specifics  07/19/2017 0302 - Progressing by Evelena Asa, RN 0/0/8676 1950 - Progressing by Evelena Asa, RN RH BOWEL ELIMINATION RH STG MANAGE BOWEL WITH ASSISTANCE Description STG Manage Bowel with Mod Assistance.  07/19/2017 0302 - Progressing by Evelena Asa, RN 02/14/2670 2458 - Progressing by Evelena Asa, RN RH STG MANAGE BOWEL W/MEDICATION W/ASSISTANCE Description STG Manage Bowel with Medication with mod Assistance.  07/19/2017 0302 - Progressing by Evelena Asa, RN 0/02/9832 8250 - Progressing by Evelena Asa, RN RH SAFETY RH STG ADHERE TO SAFETY PRECAUTIONS W/ASSISTANCE/DEVICE Description STG Adhere to Safety Precautions With Min Assistance/Device.  07/19/2017 0302 - Progressing by Evelena Asa, RN 10/14/9765 3419 - Progressing by Evelena Asa, RN

## 2017-07-19 NOTE — Progress Notes (Signed)
Occupational Therapy Session Note  Patient Details  Name: Arthur [name] MRN: [id number] Date of Birth: Sep 03, 1928  Today's Date: 07/19/2017 OT Individual Time: 1300-1415 OT Individual Time Calculation (min): 75 min   Short Term Goals: Week 1:  OT Short Term Goal 1 (Week 1): Pt will be able to roll in bed with mod A to assist caregivers with clothing changes. OT Short Term Goal 2 (Week 1): Pt will be able to sit to EOB with mod A. OT Short Term Goal 3 (Week 1): Pt will be able to maintain static sit EOB for 5 min with min A. OT Short Term Goal 4 (Week 1): Pt will be able to wash UB with min A. OT Short Term Goal 5 (Week 1): Pt will visually scan to the L to find his arm to use his R arm to position it in bed.   Skilled Therapeutic Interventions/Progress Updates:    OT treatment session focused on sitting balance, modified bathing/dressing, sit<>stand, and self-feeding. Pt transferred to sitting EOB with total A of 1 helper. Pt required Mod/max A throughout session to maintain sitting balance while participating in bathing/dressing tasks. Hand over hand A to integrate L UE into bathing for neuro re-ed. Educated pt on hemi-dressing techniques with pt needing max instructional cues to attend to L side and sequence UB dressing task. Total A +2 for LB dressing. Used Clarise Cruz lift to stand pt with 2 person assist to pull pants up over hips. Clarise Cruz lift then used to transfer pt to [name] wc with 2 person assist. Provided pt with hair washing cap and pt washed hair with mod cues to maintain attention to task in distracting environment. Worked on self-feeding lunch with cues to swallow 2x and check for pocketed food. Pt also needed cues to maintain attention to lunch tray. Pt left in care of nursing student for medications and to finish eating.   Therapy Documentation Precautions:  Precautions Precautions: Fall Precaution Comments: L inattention Restrictions Weight Bearing Restrictions: No Pain: Pain  Assessment Pain Assessment: No/denies pain ADL: ADL ADL Comments: refer to functional navigator  See Function Navigator for Current Functional Status.   Therapy/Group: Individual Therapy  Valma Cava 07/19/2017, 2:18 PM

## 2017-07-19 NOTE — Progress Notes (Signed)
Orthopedic Tech Progress Note Patient Details:  Arthur [name] 1929/03/04 [id number]  Patient ID: Arthur [name], male   DOB: July 29, 1928, 84 y.o.   MRN: [id number]   Arthur [name] 07/19/2017, 8:51 AMCalled [facility] for left Resting hand splint.

## 2017-07-20 ENCOUNTER — Inpatient Hospital Stay (HOSPITAL_COMMUNITY): Payer: Medicare Other

## 2017-07-20 ENCOUNTER — Inpatient Hospital Stay (HOSPITAL_COMMUNITY): Payer: Medicare Other | Admitting: Physical Therapy

## 2017-07-20 ENCOUNTER — Inpatient Hospital Stay (HOSPITAL_COMMUNITY): Payer: Medicare Other | Admitting: Speech Pathology

## 2017-07-20 DIAGNOSIS — I69322 Dysarthria following cerebral infarction: Secondary | ICD-10-CM

## 2017-07-20 DIAGNOSIS — Z7901 Long term (current) use of anticoagulants: Secondary | ICD-10-CM

## 2017-07-20 DIAGNOSIS — I69391 Dysphagia following cerebral infarction: Secondary | ICD-10-CM

## 2017-07-20 NOTE — Progress Notes (Signed)
Speech Language Pathology Daily Session Note  Patient Details  Name: Arthur [name] MRN: [id number] Date of Birth: Nov 05, 1928  Today's Date: 07/20/2017 SLP Individual Time: 7829-5621 SLP Individual Time Calculation (min): 30 min  Short Term Goals: Week 1: SLP Short Term Goal 1 (Week 1): Pt will complete complex problem solving tasks in timely manner with supervision cues.  SLP Short Term Goal 2 (Week 1): Pt will utilize speech intelligibility strategies to achieve ~ 75% intelligibility at the phrase level.  SLP Short Term Goal 3 (Week 1): Pt will consume dysphagia 1 diet iwth necatr thick liquids iwthoutovert s/s of aspiraiton and Min A cues for use of compensatory swallow strategies.  SLP Short Term Goal 4 (Week 1): Pt will consume trials of ice chips without overt s/s of aspiration to demonstrate readiness for repeat instrumental study.  SLP Short Term Goal 5 (Week 1): Pt will complete 3 set sof 10 CTAR before fatigue.   Skilled Therapeutic Interventions: Skilled treatment session focused on dysphagia and speech goals. Upon arrival, patient was consuming his breakfast meal of Dys. 1 textures with nectar-thick liquids via straw. Patient independently recalled his swallowing compensatory strategies and consumed meal without overt s/s of aspiration with Min A-Supervision verbal cues needed for use of swallowing strategies throughout meal. Patient also participated in an informal conversation with Min A verbal cues for use of over-articulation to maximize intelligibility to ~90%.  Patient also noted to have intermittent lability throughout session. Patient left upright in bed with RN present. Continue with current plan of care.      Function:  Eating Eating   Modified Consistency Diet: Yes Eating Assist Level: Set up assist for;Supervision or verbal cues;Helper checks for pocketed food;Helper brings food to mouth           Cognition Comprehension Comprehension assist level: Understands  basic 90% of the time/cues < 10% of the time  Expression   Expression assist level: Expresses basic 75 - 89% of the time/requires cueing 10 - 24% of the time. Needs helper to occlude trach/needs to repeat words.  Social Interaction Social Interaction assist level: Interacts appropriately 90% of the time - Needs monitoring or encouragement for participation or interaction.  Problem Solving Problem solving assist level: Solves basic 75 - 89% of the time/requires cueing 10 - 24% of the time  Memory Memory assist level: Recognizes or recalls 90% of the time/requires cueing < 10% of the time    Pain Pain Assessment Pain Assessment: No/denies pain Pain Score: 0-No pain  Therapy/Group: Individual Therapy  Aniya Jolicoeur 07/20/2017, 10:20 AM

## 2017-07-20 NOTE — Progress Notes (Signed)
Occupational Therapy Session Note  Patient Details  Name: Arthur [name] MRN: [id number] Date of Birth: 10/02/1928  Today's Date: 07/20/2017 OT Individual Time: 1015-1130 OT Individual Time Calculation (min): 75 min    Short Term Goals: Week 1:  OT Short Term Goal 1 (Week 1): Pt will be able to roll in bed with mod A to assist caregivers with clothing changes. OT Short Term Goal 2 (Week 1): Pt will be able to sit to EOB with mod A. OT Short Term Goal 3 (Week 1): Pt will be able to maintain static sit EOB for 5 min with min A. OT Short Term Goal 4 (Week 1): Pt will be able to wash UB with min A. OT Short Term Goal 5 (Week 1): Pt will visually scan to the L to find his arm to use his R arm to position it in bed.   Skilled Therapeutic Interventions/Progress Updates:    Pt resting in bed upon arrival.  OT intervention with focus BADL retraining including bathing/dressing with sit<>stand from EOB.  Pt required tot A for supine to sit EOB.  Pt initiated moving RLE over to EOB. Pt required mod A/max A for sitting balance EOB with max multimodal cues to correct balance.  Pt requires mod a to correct sitting balance.  Pt with significant lean/push to L.  Pt aware of sitting LOB approx 50% of time but requires assistance to initiate/complete correction.  Pt required max A for bathing and UB dressing sitting EOB and tot A +2 for LB dressing tasks.  Pt requires tot A for sit<>stand and slide board transfer to w/c.  Pt completed shaving task at sink Development worker, international aid) with assistance for thoroughness only.  Pt remained in w/c with all needs within reach, QRB and lap tray in place.   Therapy Documentation Precautions:  Precautions Precautions: Fall Precaution Comments: L inattention Restrictions Weight Bearing Restrictions: No  Pain:  Pt denies pain  See Function Navigator for Current Functional Status.   Therapy/Group: Individual Therapy  Leroy Libman 07/20/2017, 2:56 PM

## 2017-07-20 NOTE — Plan of Care (Signed)
  Progressing Consults [facility] STROKE PATIENT EDUCATION Description See Patient Education module for education specifics  07/20/2017 1248 - Progressing by Claris Gladden [facility] STROKE PATIENT EDUCATION Description See Patient Education module for education specifics  07/20/2017 1248 - Progressing by Claude Manges, Student-RN RH BOWEL ELIMINATION RH STG MANAGE BOWEL WITH ASSISTANCE Description STG Manage Bowel with Mod Assistance.  07/20/2017 1248 - Progressing by Claude Manges, Student-RN RH STG MANAGE BOWEL W/MEDICATION W/ASSISTANCE Description STG Manage Bowel with Medication with mod Assistance.  07/20/2017 1248 - Progressing by Claude Manges, Student-RN RH BLADDER ELIMINATION RH STG MANAGE BLADDER WITH ASSISTANCE Description STG Manage Bladder With mod Assistance  07/20/2017 1248 - Progressing by Claude Manges, Student-RN RH SKIN INTEGRITY RH STG SKIN FREE OF INFECTION/BREAKDOWN Description No new breakdown with mod  assist    07/20/2017 1248 - Progressing by Claude Manges, Student-RN RH SAFETY RH STG ADHERE TO SAFETY PRECAUTIONS W/ASSISTANCE/DEVICE Description STG Adhere to Safety Precautions With Min Assistance/Device.  07/20/2017 1248 - Progressing by Claude Manges, Student-RN

## 2017-07-20 NOTE — Progress Notes (Signed)
Patient drank approximately 8 oz of water with one delayed throat clear.  Per water protocol, patient performed oral care prior to drinking.  Brita Romp, RN

## 2017-07-20 NOTE — Patient Care Conference (Signed)
Inpatient RehabilitationTeam Conference and Plan of Care Update Date: 07/20/2017   Time: 11:25 AM    Patient Name: Arthur [name]      Medical Record Number: [id number]  Date of Birth: 1928-07-22 Sex: Male         Room/Bed: 4W22C/4W22C-01 Payor Info: Payor: [place] / Plan: BCBS MEDICARE / Product Type: *No Product type* /    Admitting Diagnosis: CVA  Admit Date/Time:  07/15/2017  5:58 PM Admission Comments: No comment available   Primary Diagnosis:  <principal problem not specified> Principal Problem: <principal problem not specified>  Patient Active Problem List   Diagnosis Date Noted  . Acute ischemic right ACA stroke ([place]) 07/15/2017  . Left spastic hemiparesis ([place]) 07/13/2017  . Ischemic stroke ([place]) 07/11/2017  . Acute arterial ischemic stroke, multifocal, anterior circulation ([place]) 07/11/2017  . Acute respiratory failure ([place])   . Abnormal nuclear stress test 01/24/2015  . Encounter for therapeutic drug monitoring 07/19/2013  . Long term current use of anticoagulant 05/28/2011  . Dizziness 05/14/2011  . Hyperlipidemia   . Hypertension   . Coronary artery disease   . MVP (mitral valve prolapse)   . Paroxysmal atrial fibrillation (HCC)   . Chronic renal insufficiency   . 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 [facility])     Expected Discharge Date: Expected Discharge Date: 08/10/17  Team Members Present: Physician leading conference: Dr. Alysia Penna Social Worker Present: Ovidio Kin, LCSW Nurse Present: Rayetta Pigg, RN PT Present: Dwyane Dee, PT OT Present: Cherylynn Ridges, OT SLP Present: Weston Anna, SLP PPS Coordinator present : Daiva Nakayama, RN, CRRN     Current Status/Progress Goal Weekly Team Focus  Medical             Bowel/Bladder   Intermittent continence of B/B  Patient will use urinal as needed and call for bedpan  Timed toileting, assist as needed   Swallow/Nutrition/ Hydration   Dys. 1 textures  with nectar-thick liquids, Min A  Supervision  use of swallowing strategies, trials of upgraded textures and liquids    ADL's   Total A+2  Mod A overall  Modified bathing/dressing, sitting balance, L NMR, L attention   Mobility   total overall  min for bed mobility and transfers, mod for ambulation and stairs, supervision for w/c  NMR, postural control, midline orientation, attention   Communication   Min A  Supervision  use of speech intelligibility strategies    Safety/Cognition/ Behavioral Observations  Min A  Supervision  milyd complex problem solving, attention to left field of enviornment    Pain   Chronic pain at 4/10 to right knee and neck  Pain < 2 after use of analgesics  Utilize voltaren as scheduled and tylenol PRN   Skin   Inflamed rectal area, skin tears to LUE  Maintain skin integrity, revent infection  Foam to skin tears, barrier cream to peri area.      *See Care Plan and progress notes for long and short-term goals.     Barriers to Discharge  Current Status/Progress Possible Resolutions Date Resolved   Physician                    Nursing                  PT  Inaccessible home environment;Decreased caregiver support                 OT  SLP                SW Decreased caregiver support Wife can not provide physical care upon discharge            Discharge Planning/Teaching Needs:  Wife waiting to see pt's progress, she is limited herself, would need to hire 24 hr caregivers for home. Good neighbors and friends      Team Discussion:  Goals of mod/max level of assist. Nursing doing timed toileting to try to work on continence. Pushes to the left. Eating is fatiguing to him. Neuro-psych to see for coping. Wife would need to hire assist for pt to be able to return home.  Revisions to Treatment Plan:  DC 2/27       Laurabeth Yip, [name] 07/20/2017, 1:13 PM

## 2017-07-20 NOTE — Progress Notes (Signed)
Physical Therapy Note  Patient Details  Name: Arthur [name] MRN: [id number] Date of Birth: 18-Feb-1929 Today's Date: 07/20/2017  [id number], 30 min individual tx Pain: none per pt  Pt seen bedside for neuro re-ed LLE via multimodal cues, visual feedback for supine bil bridging with assistance for hold L foot, bil hip internal rotation with partial AROM LLE noted, LLE mass extension from mass flexed position. Pt cued to focus on movement and hold conversation; he responded well to suggestion.   L visual attention while wearing glasses, to read his schedule, written larger by PT.  With visual cues to look L, pt read simple schedule accurately 100%.    Pt left resting in supine with towel roll behind head on R to encourage L visual attention to environment.  Bed alarm set and all needs within reach.  See function navigator for current status.   Jaking Thayer 07/20/2017, 8:07 AM

## 2017-07-20 NOTE — Progress Notes (Addendum)
Subjective/Complaints: No issues overnite, denies pain  ROS- no CP, SOB, N/V/D  Objective:  Vital Signs: Blood pressure (!) 158/83, pulse 73, temperature 97.6 F (36.4 C), temperature source Oral, resp. rate 18, height _0  (1.702 m), weight 79.6 kg (175 lb 7.8 oz), SpO2 97 %.  Results for orders placed or performed during the hospital encounter of 07/15/17 (from the past 72 hour(s))  Basic metabolic panel     Status: Abnormal   Collection Time: 07/18/17  8:04 AM  Result Value Ref Range   Sodium 146 (H) 135 - 145 mmol/L   Potassium 4.0 3.5 - 5.1 mmol/L   Chloride 114 (H) 101 - 111 mmol/L   CO2 19 (L) 22 - 32 mmol/L   Glucose, Bld 149 (H) 65 - 99 mg/dL   BUN 26 (H) 6 - 20 mg/dL   Creatinine, Ser 1.09 0.61 - 1.24 mg/dL   Calcium 8.9 8.9 - 10.3 mg/dL   GFR calc non Af Amer 59 (L) >60 mL/min   GFR calc Af Amer >60 >60 mL/min    Comment: (NOTE) The eGFR has been calculated using the CKD EPI equation. This calculation has not been validated in all clinical situations. eGFR's persistently <60 mL/min signify possible Chronic Kidney Disease.    Anion gap 13 5 - 15    Comment: Performed at [facility] [address], [place], [place] [address]  CBC     Status: Abnormal   Collection Time: 07/18/17  8:04 AM  Result Value Ref Range   WBC 8.9 4.0 - 10.5 K/uL   RBC 4.29 4.22 - 5.81 MIL/uL   Hemoglobin 12.9 (L) 13.0 - 17.0 g/dL   HCT 40.2 39.0 - 52.0 %   MCV 93.7 78.0 - 100.0 fL   MCH 30.1 26.0 - 34.0 pg   MCHC 32.1 30.0 - 36.0 g/dL   RDW 14.4 11.5 - 15.5 %   Platelets 276 150 - 400 K/uL    Comment: Performed at [facility], [place] [address], [place], [place] [address]     HEENT: left eye less injected , minimal mucus, left facial droop Cardio: RRR and no murmur Resp: CTA B/L and unlabored GI: BS positive and NT, ND Extremity:  No Edema, no calf or thigh tenderness, no pain with ROM Skin:   Other ecchymosis Left forearm Neuro: Alert/Oriented, Flat, Abnormal Motor  0/5 in LUE and LLE, Abnormal FMC Tone  Hypotonia and Dysarthric Musc/Skel:  Extremity tender RIght knee, no erythema, reduced ROM Gen NAD   Assessment/Plan: 1. 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 Physiatrist is providing close team supervision and 24 hour management of active medical problems listed below. Physiatrist and rehab team continue to assess barriers to discharge/monitor patient progress toward functional and medical goals. FIM: Function - Bathing Position: Sitting EOB Body parts bathed by patient: Chest, Abdomen, Right upper leg, Left upper leg Body parts bathed by helper: Right arm, Left arm, Front perineal area, Buttocks, Left lower leg, Right lower leg Assist Level: 2 helpers  Function- Upper Body Dressing/Undressing What is the patient wearing?: Pull over shirt/dress Pull over shirt/dress - Perfomed by patient: Thread/unthread right sleeve Pull over shirt/dress - Perfomed by helper: Thread/unthread left sleeve, Put head through opening, Pull shirt over trunk Assist Level: Touching or steadying assistance(Pt > 75%) Function -  Lower Body Dressing/Undressing Lower body dressing/undressing activity did not occur: N/A What is the patient wearing?: Pants, Non-skid slipper socks Position: Sitting EOB Pants- Performed by helper: Thread/unthread right pants leg, Thread/unthread left pants leg, Pull pants up/down Non-skid slipper socks- Performed by helper: Don/doff left sock, Don/doff right sock Assist for footwear: Dependant Assist for lower body dressing: 2 Helpers  Function - Toileting Toileting activity did not occur: Safety/medical concerns Toileting steps completed by helper: Adjust clothing prior to toileting, Performs perineal hygiene, Adjust clothing after  toileting Assist level: Two helpers  Function - Air cabin crew transfer activity did not occur: Safety/medical concerns Toilet transfer assistive device: Pension scheme manager, NT report) Assist level to toilet: 2 helpers(per Candice, NT report)  Function - Chair/bed transfer Chair/bed transfer method: Stand pivot Chair/bed transfer assist level: 2 helpers Chair/bed transfer assistive device: Mechanical lift Mechanical lift: Clarise Cruz Chair/bed transfer details: Manual facilitation for weight bearing, Manual facilitation for weight shifting, Manual facilitation for placement, Verbal cues for technique, Verbal cues for precautions/safety  Function - Locomotion: Wheelchair Will patient use wheelchair at discharge?: Yes Type: Manual Wheelchair activity did not occur: Safety/medical concerns Wheel 50 feet with 2 turns activity did not occur: Safety/medical concerns Wheel 150 feet activity did not occur: Safety/medical concerns Function - Locomotion: Ambulation Ambulation activity did not occur: Safety/medical concerns Walk 10 feet activity did not occur: Safety/medical concerns Walk 50 feet with 2 turns activity did not occur: Safety/medical concerns Walk 150 feet activity did not occur: Safety/medical concerns Walk 10 feet on uneven surfaces activity did not occur: Safety/medical concerns  Function - Comprehension Comprehension: Auditory Comprehension assist level: Follows complex conversation/direction with extra time/assistive device  Function - Expression Expression: Verbal Expression assist level: Expresses complex ideas: With extra time/assistive device  Function - Social Interaction Social Interaction assist level: Interacts appropriately 90% of the time - Needs monitoring or encouragement for participation or interaction.  Function - Problem Solving Problem solving assist level: Solves basic 90% of the time/requires cueing < 10% of the time  Function -  Memory Memory assist level: Recognizes or recalls 90% of the time/requires cueing < 10% of the time Patient normally able to recall (first 3 days only): Current season, Location of own room, Staff names and faces, That he or she is in a hospital  Medical Problem List and Plan:  1. 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 Formulized individual treatment plan based on medical history, underlying problem and comorbidities. 2. DVT Prophylaxis/Anticoagulation: Pharmaceutical: eliquis 3. Pain Management: tylenol prn  4. Mood: LCSW to follow for evaluation and support  5. Neuropsych: This patient is not capable of making decisions on his own behalf.  6. Skin/Wound Care: routine pressure relief measures  7. Fluids/Electrolytes/Nutrition: monitor I/O. Offer liquids between meals to maintain adequate hydration. Change IVF to nights to avoid overload  8. CAD s/p Maze/ Chronic A fib: Monitor HR bid. Eliquis  2/4 9. FUO: resolved,no leukocytosis, CXR no infiltrate, may have atelectasis Remains afebrile off Levaquin  10 GERD: Managed with Protonix  11. CKD stage II: Baseline SCr 1.2. Most recent 1.09- stable 12. HTN: Monitor BP bid--Home medsCoreg, Lotensin, Amlodipine  Vitals:   07/19/17 1705 07/20/17 0616  BP:  (!) 158/83  Pulse: 81 73  Resp: 18 18  Temp: 97.8 F (36.6 C) 97.6 F (36.4 C)  SpO2:  97%  Resume amlodipine 57m 2/5, monitor , may need to increase to 158m 14. Irritation left eyVTX:[id number]  ciloxan gtts, tx x 1 wk    LOS (Days) 5 A FACE TO FACE EVALUATION WAS PERFORMED  AnCharlett Blake/11/2017, 9:17 AM

## 2017-07-20 NOTE — Progress Notes (Signed)
Physical Therapy Session Note  Patient Details  Name: Arthur [name] MRN: [id number] Date of Birth: 10/15/1928  Today's Date: 07/20/2017 PT Individual Time: 1500-1600 PT Individual Time Calculation (min): 60 min   Short Term Goals: Week 1:  PT Short Term Goal 1 (Week 1): Pt will increase bed mobility to max A.  PT Short Term Goal 2 (Week 1): Pt will increase transfers bed to chair to max A.  PT Short Term Goal 3 (Week 1): Pt will ambulate with appropriate assistive device about 5 feet with +2 Assist PT Short Term Goal 4 (Week 1): Pt will ascend/descend 1 stair with 1 rail and max A.  PT Short Term Goal 5 (Week 1): Pt will propel w/c about 25 feet with min A.   Skilled Therapeutic Interventions/Progress Updates:    no c/o pain at rest, does indicate severe R knee pain with weight bearing.  Session focus on midline orientation, postural control, balance strategies, visual scanning, and attention.    Pt transfers to therapy mat with total assist to R and +2 assist to transfer to L with squat/pivot.  Seated NMR with mirror feedback with focus on midline orientation, postural control, righting reactions, and attention.  Pt continues to require overall max multimodal cues and max assist to maintain static sitting balance with moments of close supervision.  Engaged in dynamic reaching task, but pt unable to attend to posture and task at the same time.  Transition to supine with mod assist for LEs.  Facilitated bridging x10 reps focus on posterior/anterior pelvic positioning.  Thoracic mobilizations focus on thoracic mobility 3x30 seconds in supine.  PT positioned towel roll under L hip in w/c and raised w/c armrests for improved positioning and sitting tolerance. Pt returned to room at end of session and positioned with call bell in reach and needs met.   Therapy Documentation Precautions:  Precautions Precautions: Fall Precaution Comments: L inattention Restrictions Weight Bearing Restrictions:  No   See Function Navigator for Current Functional Status.   Therapy/Group: Individual Therapy   E  07/20/2017, 4:33 PM  

## 2017-07-21 ENCOUNTER — Inpatient Hospital Stay (HOSPITAL_COMMUNITY): Payer: Medicare Other | Admitting: Occupational Therapy

## 2017-07-21 ENCOUNTER — Inpatient Hospital Stay (HOSPITAL_COMMUNITY): Payer: Medicare Other | Admitting: Physical Therapy

## 2017-07-21 ENCOUNTER — Inpatient Hospital Stay (HOSPITAL_COMMUNITY): Payer: Medicare Other | Admitting: Speech Pathology

## 2017-07-21 MED ORDER — AMLODIPINE BESYLATE 10 MG PO TABS
10.0000 mg | ORAL_TABLET | Freq: Every day | ORAL | Status: DC
  Administered 2017-07-22 – 2017-08-03 (×13): 10 mg via ORAL
  Filled 2017-07-21 (×13): qty 1

## 2017-07-21 NOTE — Progress Notes (Signed)
Physical Therapy Session Note  Patient Details  Name: Arthur [name] MRN: [id number] Date of Birth: 07/13/1928  Today's Date: 07/21/2017 PT Individual Time: 1300-1420 PT Individual Time Calculation (min): 80 min   Short Term Goals: Week 1:  PT Short Term Goal 1 (Week 1): Pt will increase bed mobility to max A.  PT Short Term Goal 2 (Week 1): Pt will increase transfers bed to chair to max A.  PT Short Term Goal 3 (Week 1): Pt will ambulate with appropriate assistive device about 5 feet with +2 Assist PT Short Term Goal 4 (Week 1): Pt will ascend/descend 1 stair with 1 rail and max A.  PT Short Term Goal 5 (Week 1): Pt will propel w/c about 25 feet with min A.   Skilled Therapeutic Interventions/Progress Updates:    no c/o pain.  Session focus on NMR for postural control, midline orientation, and core activation.    Pt requires +2 assist for squat/pivot to R x2 today.  Static sitting balance with wall for end point on R progress from mod assist with max cues for upright posture and R weight shift.  Progress to dynamic sitting balance, focus on maintaining balance and return to midline/upright after reaching tasks.  Pt initially requiring max assist and total multimodal cues for reaching forward/down for a cup and placing to the R on the windowsill.  Able to progress to min assist and increased time for balance with blocked practice and min multimodal cues.  Transition to supine on mat with bolster under knees, reaching to R for cup, then placing cup across body on L;  repeat reaching across body to L for cup, and placing down/R with focus on lateral trunk flexion in each direction and abdominal activation.  +2 to come to long sitting on mat, pivot, and scoot to edge of mat.  Pt transferred back to w/c on R in same manner as above and returned to room, call bell in reach and needs met.   Therapy Documentation Precautions:  Precautions Precautions: Fall Precaution Comments: L  inattention Restrictions Weight Bearing Restrictions: No   See Function Navigator for Current Functional Status.   Therapy/Group: Individual Therapy  Michel Santee 07/21/2017, 3:14 PM

## 2017-07-21 NOTE — Progress Notes (Signed)
Speech Language Pathology Weekly Progress Note  Patient Details  Name: Arthur [name] MRN: [id number] Date of Birth: [birth year]  Beginning of progress report period: July 14, 2017 End of progress report period: July 21, 2017   Short Term Goals: Week 1: SLP Short Term Goal 1 (Week 1): Pt will complete complex problem solving tasks in timely manner with supervision cues.  SLP Short Term Goal 1 - Progress (Week 1): Not met SLP Short Term Goal 2 (Week 1): Pt will utilize speech intelligibility strategies to achieve ~ 75% intelligibility at the phrase level.  SLP Short Term Goal 2 - Progress (Week 1): Met SLP Short Term Goal 3 (Week 1): Pt will consume dysphagia 1 diet iwth necatr thick liquids iwthoutovert s/s of aspiraiton and Min A cues for use of compensatory swallow strategies.  SLP Short Term Goal 3 - Progress (Week 1): Met SLP Short Term Goal 4 (Week 1): Pt will consume trials of ice chips without overt s/s of aspiration to demonstrate readiness for repeat instrumental study.  SLP Short Term Goal 4 - Progress (Week 1): Met SLP Short Term Goal 5 (Week 1): Pt will complete 3 set sof 10 CTAR before fatigue.  SLP Short Term Goal 5 - Progress (Week 1): Met    New Short Term Goals: Week 2: SLP Short Term Goal 1 (Week 2): Pt will demonstrate complex problem solving during functional tasks with supervision verbal cues.  SLP Short Term Goal 2 (Week 2): Pt will utilize speech intelligibility strategies to achieve ~ 75% intelligibility at the sentence level with Min A verbal cues.  SLP Short Term Goal 3 (Week 2): Pt will consume current diet with minimal overt s/s of aspiraton with supervision verbal cues for use of swallowing compensatory strategies.  SLP Short Term Goal 4 (Week 2): Patient will attend to left field of enviornment during functional tasks with Min A verbal cues.   Weekly Progress Updates: Patient has made excellent gains and has met 4 of 5 STG's this reporting period.  Currently, patient is consuming Dys. 1 textures with nectar-thick liquids with minimal overt s/s of aspiration and Min A verbal cues for use of swallowing compensatory strategies. Patient is also consuming trials of thin liquids with minimal overt s/s of aspiration, therefore, patient will have repeat MBS tomorrow to assess for possible upgrade. Patient demonstrates increased speech intelligibility and is overall 75-90% intelligible at the phrase level with Min A verbal cues and requires Min-Mod A verbal cues for complex problem solving. Patient and family education is ongoing. Patient would benefit from continued skilled SLP intervention to maximize his cognitive and swallowing function as well as his speech intelligibility in order to maximize his overall functional independence prior to discharge.      Intensity: Minumum of 1-2 x/day, 30 to 90 minutes Frequency: 3 to 5 out of 7 days Duration/Length of Stay: 08/10/17 Treatment/Interventions: Cognitive remediation/compensation;Cueing hierarchy;Dysphagia/aspiration precaution training;Functional tasks;Patient/family education;Therapeutic Activities;Speech/Language facilitation;Environmental controls;Internal/external aids     [place], [place] 07/21/2017, 3:21 PM

## 2017-07-21 NOTE — Plan of Care (Signed)
  Progressing Consults [facility] STROKE PATIENT EDUCATION Description See Patient Education module for education specifics  07/21/2017 0240 - Progressing by Evelena Asa, RN [facility] STROKE PATIENT EDUCATION Description See Patient Education module for education specifics  07/21/2017 0240 - Progressing by Evelena Asa, RN [facility] BOWEL ELIMINATION RH STG MANAGE BOWEL WITH ASSISTANCE Description STG Manage Bowel with [place].  07/21/2017 0240 - Progressing by Evelena Asa, RN RH STG MANAGE BOWEL W/MEDICATION W/ASSISTANCE Description STG Manage Bowel with Medication with mod Assistance.  07/21/2017 0240 - Progressing by Evelena Asa, RN RH BLADDER ELIMINATION RH STG MANAGE BLADDER WITH ASSISTANCE Description STG Manage Bladder With mod Assistance  07/21/2017 0240 - Progressing by Evelena Asa, RN RH SKIN INTEGRITY RH STG SKIN FREE OF INFECTION/BREAKDOWN Description No new breakdown with mod  assist    07/21/2017 0240 - Progressing by Evelena Asa, RN RH SAFETY RH STG ADHERE TO SAFETY PRECAUTIONS W/ASSISTANCE/DEVICE Description STG Adhere to Safety Precautions With Min Assistance/Device.  07/21/2017 0240 - Progressing by Evelena Asa, RN

## 2017-07-21 NOTE — Progress Notes (Signed)
Social Work Patient ID: Arthur [name], male   DOB: 11-26-1928, 84 y.o.   MRN: [id number]  Met with pt, granddaughter and wife to discuss team conference goals min-mod level of assist and coming up with a safe discharge plan. Wife is aware of the cost of hiring assist and wants to await pt's progress. She realizes her abilities and what she would need to do. Asked about home health and coverages. Will work with on safe discharge plan. Granddaughter here from [place] for a week.

## 2017-07-21 NOTE — Progress Notes (Signed)
Occupational Therapy Session Note  Patient Details  Name: Arthur [name] MRN: [id number] Date of Birth: 1929-02-10  Today's Date: 07/21/2017 OT Individual Time: 1000-1100 OT Individual Time Calculation (min): 60 min   Short Term Goals: Week 1:  OT Short Term Goal 1 (Week 1): Pt will be able to roll in bed with mod A to assist caregivers with clothing changes. OT Short Term Goal 2 (Week 1): Pt will be able to sit to EOB with mod A. OT Short Term Goal 3 (Week 1): Pt will be able to maintain static sit EOB for 5 min with min A. OT Short Term Goal 4 (Week 1): Pt will be able to wash UB with min A. OT Short Term Goal 5 (Week 1): Pt will visually scan to the L to find his arm to use his R arm to position it in bed.   Skilled Therapeutic Interventions/Progress Updates:    OT treatment session focused on modified bathing/dressing, L attention, and trunk control. Pt greeted in bed and agreeable to OT. Max A to roll to the L and total A to roll R to wash buttocks and change brief- although brief was dry and he had not had a BM. Total A to thread pant legs, then worked on hip bridging to activate glutes while OT pulled pants up over hips. Pt able to clear L glute from bed with OT supporting LLE. Maximove used to transfer pt to TIS wc. Pt brought to the sink for UB bathing/dressing. Max cues to look to the L to locate warm water and grooming items. Hand over hand A for L UE NMR to wash body parts. Facilitated trunk extension with reaching activity at the sink. Incorporated mirror feedback to help bring pt to midline. Pt left seated in TIS wc at end of session with full lap tray and needs met.   Therapy Documentation Precautions:  Precautions Precautions: Fall Precaution Comments: L inattention Restrictions Weight Bearing Restrictions: No Pain:  none/denies pain ADL: ADL ADL Comments: refer to functional navigator    See Function Navigator for Current Functional Status.   Therapy/Group:  Individual Therapy  Valma Cava 07/21/2017, 9:51 AM

## 2017-07-21 NOTE — Progress Notes (Signed)
Speech Language Pathology Daily Session Note  Patient Details  Name: RAM HAUGAN MRN: [id number] Date of Birth: 1929-03-25  Today's Date: 07/21/2017 SLP Individual Time: 1100-1200 SLP Individual Time Calculation (min): 60 min  Short Term Goals: Week 1: SLP Short Term Goal 1 (Week 1): Pt will complete complex problem solving tasks in timely manner with supervision cues.  SLP Short Term Goal 2 (Week 1): Pt will utilize speech intelligibility strategies to achieve ~ 75% intelligibility at the phrase level.  SLP Short Term Goal 3 (Week 1): Pt will consume dysphagia 1 diet iwth necatr thick liquids iwthoutovert s/s of aspiraiton and Min A cues for use of compensatory swallow strategies.  SLP Short Term Goal 4 (Week 1): Pt will consume trials of ice chips without overt s/s of aspiration to demonstrate readiness for repeat instrumental study.  SLP Short Term Goal 5 (Week 1): Pt will complete 3 set sof 10 CTAR before fatigue.   Skilled Therapeutic Interventions: Skilled treatment session focused on dysphagia and speech goals. SLP facilitated session by providing oral care via the suction toothbrush prior to administering of the water protocol. Patient consumed trials of thin liquids via cup without overt s/s of aspiration and required intermittent verbal cues for use of 2 swallows. Recommend repeat MBS tomorrow to assess for possible upgrade. Patient also required Min A verbal cues for use of speech intelligibility strategies at the phrase and sentence level during a verbal description task in which patient achieved ~90% intelligibility. Patient left upright in wheelchair with all needs within reach. Continue with current plan of care.      Function:  Eating Eating   Modified Consistency Diet: No(with trials from SLP) Eating Assist Level: Supervision or verbal cues      Cognition Comprehension Comprehension assist level: Understands basic 90% of the time/cues < 10% of the time;Follows complex  conversation/direction with extra time/assistive device  Expression   Expression assist level: Expresses complex ideas: With extra time/assistive device  Social Interaction Social Interaction assist level: Interacts appropriately 90% of the time - Needs monitoring or encouragement for participation or interaction.  Problem Solving Problem solving assist level: Solves basic 75 - 89% of the time/requires cueing 10 - 24% of the time  Memory Memory assist level: Recognizes or recalls 90% of the time/requires cueing < 10% of the time    Pain No/Denies Pain   Therapy/Group: Individual Therapy  Bhavik Cabiness 07/21/2017, 12:41 PM

## 2017-07-21 NOTE — Progress Notes (Signed)
Subjective/Complaints:  No issues per RN, eating well  ROS- no CP, SOB, N/V/D  Objective:  Vital Signs: Blood pressure (!) 150/83, pulse 87, temperature 97.9 F (36.6 C), temperature source Axillary, resp. rate 16, height 5\' 7"  (1.702 m), weight 79.6 kg (175 lb 7.8 oz), SpO2 99 %.  No results found for this or any previous visit (from the past 72 hour(s)).   HEENT: left eye less injected , minimal mucus, left facial droop Cardio: RRR and no murmur Resp: CTA B/L and unlabored GI: BS positive and NT, ND Extremity:  No Edema, no calf or thigh tenderness, no pain with ROM Skin:   Other ecchymosis Left forearm Neuro: Alert/Oriented, Flat, Abnormal Motor 0/5 in LUE and LLE, Abnormal FMC Tone  Hypotonia and Dysarthric Musc/Skel:  Extremity tender RIght knee, no erythema, reduced ROM Gen NAD   Assessment/Plan: 1. 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 Physiatrist is providing close team supervision and 24 hour management of active medical problems listed below. Physiatrist and rehab team continue to assess barriers to discharge/monitor patient progress toward functional and medical goals. FIM: Function - Bathing Position: Sitting EOB Body parts bathed by patient: Chest, Abdomen, Right upper leg, Left upper leg Body parts bathed by helper: Right arm, Left arm, Front perineal area, Buttocks, Right lower leg, Left lower leg Assist Level: 2 helpers  Function- Upper Body Dressing/Undressing What is the patient wearing?: Button up shirt Pull over shirt/dress - Perfomed by patient: Thread/unthread right sleeve Pull over shirt/dress - Perfomed by helper: Thread/unthread left sleeve, Put head through opening, Pull shirt over trunk Button up shirt - Perfomed by patient: Thread/unthread right  sleeve Button up shirt - Perfomed by helper: Thread/unthread left sleeve, Pull shirt around back, Button/unbutton shirt Assist Level: 2 helpers Function - Lower Body Dressing/Undressing Lower body dressing/undressing activity did not occur: N/A What is the patient wearing?: Pants, Non-skid slipper socks Position: Sitting EOB Pants- Performed by helper: Thread/unthread right pants leg, Thread/unthread left pants leg, Pull pants up/down Non-skid slipper socks- Performed by helper: Don/doff left sock, Don/doff right sock Assist for footwear: Dependant Assist for lower body dressing: 2 Helpers  Function - Toileting Toileting activity did not occur: Safety/medical concerns Toileting steps completed by helper: Adjust clothing prior to toileting, Performs perineal hygiene, Adjust clothing after toileting Assist level: Two helpers  Function - Air cabin crew transfer activity did not occur: Safety/medical concerns Toilet transfer assistive device: Pension scheme manager, NT report) Assist level to toilet: 2 helpers(per Candice, NT report)  Function - Chair/bed transfer Chair/bed transfer method: Squat pivot Chair/bed transfer assist level: 2 helpers Chair/bed transfer assistive device: Mechanical lift Mechanical lift: Clarise Cruz Chair/bed transfer details: Manual facilitation for weight bearing, Manual facilitation for weight shifting, Manual facilitation for placement, Verbal cues for technique, Verbal cues for precautions/safety  Function - Locomotion: Wheelchair Will patient use wheelchair at discharge?: Yes Type: Manual Wheelchair activity did not occur: Safety/medical concerns Wheel 50 feet with 2 turns activity did not occur: Safety/medical concerns Wheel 150 feet activity did not occur: Safety/medical concerns Function - Locomotion: Ambulation Ambulation activity did not occur: Safety/medical concerns Walk 10 feet activity did not occur: Safety/medical concerns Walk 50  feet with 2 turns activity did not occur: Safety/medical concerns Walk 150 feet activity did not occur: Safety/medical concerns Walk 10 feet on uneven surfaces activity did  not occur: Safety/medical concerns  Function - Comprehension Comprehension: Auditory Comprehension assist level: Understands basic 90% of the time/cues < 10% of the time, Follows complex conversation/direction with extra time/assistive device  Function - Expression Expression: Verbal Expression assist level: Expresses complex ideas: With extra time/assistive device  Function - Social Interaction Social Interaction assist level: Interacts appropriately 90% of the time - Needs monitoring or encouragement for participation or interaction.  Function - Problem Solving Problem solving assist level: Solves basic 75 - 89% of the time/requires cueing 10 - 24% of the time  Function - Memory Memory assist level: Recognizes or recalls 90% of the time/requires cueing < 10% of the time Patient normally able to recall (first 3 days only): Current season, Location of own room, Staff names and faces, That he or she is in a hospital  Medical Problem List and Plan:  1. Functional deficits and left hemiparesis secondary to right MCA infarct, small right ACA infarct, posterior watershed infarct due to cardio-embolic source  -CIR PT, OT, SLP, cont CIR 2. DVT Prophylaxis/Anticoagulation: Pharmaceutical: eliquis 3. Pain Management: tylenol prn  4. Mood: LCSW to follow for evaluation and support  5. Neuropsych: This patient is not capable of making decisions on his own behalf.  6. Skin/Wound Care: routine pressure relief measures  7. Fluids/Electrolytes/Nutrition: monitor I/O. Offer liquids between meals to maintain adequate hydration. Change IVF to nights to avoid overload I 662ml 8. CAD s/p Maze/ Chronic A fib: Monitor HR bid. Eliquis  2/4, no bleeding, 2/4 Hgb 12.9 9. FUO: resolved,no leukocytosis, CXR no infiltrate, may have atelectasis  Remains afebrile off Levaquin  10 GERD: Managed with Protonix  11. CKD stage II: Baseline SCr 1.2. Most recent 1.09- stable  12. HTN: Monitor BP bid--Home medsCoreg, Lotensin, Amlodipine  Vitals:   07/20/17 1500 07/21/17 0016  BP: (!) 160/80 (!) 150/83  Pulse: 80 87  Resp: 18 16  Temp: 97.6 F (36.4 C) 97.9 F (36.6 C)  SpO2: 98% 99%  Resume amlodipine 5mg  2/5, , will  increase to 10mg   14. Irritation left GYI:[id number] with  ciloxan gtts, tx x 1 wk    LOS (Days) 6 A FACE TO FACE EVALUATION WAS PERFORMED  Charlett Blake 07/21/2017, 8:24 AM

## 2017-07-22 ENCOUNTER — Inpatient Hospital Stay (HOSPITAL_COMMUNITY): Payer: Medicare Other

## 2017-07-22 ENCOUNTER — Inpatient Hospital Stay (HOSPITAL_COMMUNITY): Payer: Medicare Other | Admitting: Physical Therapy

## 2017-07-22 ENCOUNTER — Inpatient Hospital Stay (HOSPITAL_COMMUNITY): Payer: Medicare Other | Admitting: Occupational Therapy

## 2017-07-22 ENCOUNTER — Encounter (HOSPITAL_COMMUNITY): Payer: Medicare Other | Admitting: Speech Pathology

## 2017-07-22 NOTE — Plan of Care (Signed)
  Not Progressing RH BLADDER ELIMINATION RH STG MANAGE BLADDER WITH ASSISTANCE Description STG Manage Bladder With mod Assistance Incontinent-requiring max assistance.  07/22/2017 0126 - Not Progressing by Cornell Barman, RN

## 2017-07-22 NOTE — Progress Notes (Signed)
Subjective/Complaints:  Feels like he is moving his LLE more, no pains, eating and drinking well  ROS- no CP, SOB, N/V/D  Objective:  Vital Signs: Blood pressure (!) 168/87, pulse 70, temperature 97.6 F (36.4 C), temperature source Oral, resp. rate 12, height 5\' 7"  (1.702 m), weight 79.6 kg (175 lb 7.8 oz), SpO2 99 %.  No results found for this or any previous visit (from the past 72 hour(s)).   HEENT: left eye less injected , minimal mucus, left facial droop Cardio: RRR and no murmur Resp: CTA B/L and unlabored GI: BS positive and NT, ND Extremity:  No Edema, no calf or thigh tenderness, no pain with ROM Skin:   Other ecchymosis Left forearm Neuro: Alert/Oriented, Flat, Abnormal Motor 0/5 in LUE and2- hip/knee ext  LLE, Abnormal FMC Tone  Hypotonia and Dysarthric Musc/Skel:  Extremity tender RIght knee, no erythema, reduced ROM Gen NAD   Assessment/Plan: 1. 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 Physiatrist is providing close team supervision and 24 hour management of active medical problems listed below. Physiatrist and rehab team continue to assess barriers to discharge/monitor patient progress toward functional and medical goals. FIM: Function - Bathing Position: Sitting EOB Body parts bathed by patient: Chest, Abdomen, Right upper leg, Left upper leg Body parts bathed by helper: Right arm, Left arm, Front perineal area, Buttocks, Right lower leg, Left lower leg Assist Level: 2 helpers  Function- Upper Body Dressing/Undressing What is the patient wearing?: Pull over shirt/dress Pull over shirt/dress - Perfomed by patient: Thread/unthread right sleeve Pull over shirt/dress - Perfomed by helper: Thread/unthread right sleeve, Thread/unthread left sleeve, Put head through  opening, Pull shirt over trunk Button up shirt - Perfomed by patient: Thread/unthread right sleeve Button up shirt - Perfomed by helper: Thread/unthread left sleeve, Pull shirt around back, Button/unbutton shirt Assist Level: 2 helpers Function - Lower Body Dressing/Undressing Lower body dressing/undressing activity did not occur: N/A What is the patient wearing?: Pants, Non-skid slipper socks Position: Bed Pants- Performed by helper: Thread/unthread right pants leg, Pull pants up/down, Thread/unthread left pants leg Non-skid slipper socks- Performed by helper: Don/doff left sock, Don/doff right sock Assist for footwear: Dependant Assist for lower body dressing: 2 Helpers  Function - Toileting Toileting activity did not occur: Safety/medical concerns Toileting steps completed by helper: Adjust clothing prior to toileting, Performs perineal hygiene, Adjust clothing after toileting Assist level: Two helpers  Function - Air cabin crew transfer activity did not occur: Safety/medical concerns Toilet transfer assistive device: Pension scheme manager, NT report) Assist level to toilet: 2 helpers(per Candice, NT report)  Function - Chair/bed transfer Chair/bed transfer method: Squat pivot Chair/bed transfer assist level: 2 helpers Chair/bed transfer assistive device: Mechanical lift Mechanical lift: Clarise Cruz Chair/bed transfer details: Manual facilitation for weight bearing, Manual facilitation for weight shifting, Manual facilitation for placement, Verbal cues for technique, Verbal cues for precautions/safety  Function - Locomotion: Wheelchair Will patient use wheelchair at discharge?: Yes Type: Manual Wheelchair activity did not occur: Safety/medical concerns Wheel 50 feet with 2 turns activity did not occur: Safety/medical concerns Wheel 150 feet activity did not occur: Safety/medical concerns Function - Locomotion: Ambulation Ambulation activity did not occur:  Safety/medical concerns Walk 10 feet activity did not occur: Safety/medical concerns Walk 50 feet with 2 turns activity did not occur: Safety/medical concerns Walk 150 feet activity  did not occur: Safety/medical concerns Walk 10 feet on uneven surfaces activity did not occur: Safety/medical concerns  Function - Comprehension Comprehension: Auditory Comprehension assist level: Understands basic 90% of the time/cues < 10% of the time, Follows complex conversation/direction with extra time/assistive device  Function - Expression Expression: Verbal Expression assist level: Expresses complex ideas: With extra time/assistive device  Function - Social Interaction Social Interaction assist level: Interacts appropriately 90% of the time - Needs monitoring or encouragement for participation or interaction.  Function - Problem Solving Problem solving assist level: Solves basic 75 - 89% of the time/requires cueing 10 - 24% of the time  Function - Memory Memory assist level: Recognizes or recalls 90% of the time/requires cueing < 10% of the time Patient normally able to recall (first 3 days only): Current season, Staff names and faces, That he or she is in a hospital  Medical Problem List and Plan:  1. Functional deficits and left hemiparesis secondary to right MCA infarct, small right ACA infarct, posterior watershed infarct due to cardio-embolic source  -CIR PT, OT, SLP, cont CIR, increased LLE strength 2. DVT Prophylaxis/Anticoagulation: Pharmaceutical: eliquis no clinical evidence of DVT  3. Pain Management: tylenol prn  4. Mood: LCSW to follow for evaluation and support  5. Neuropsych: This patient is not capable of making decisions on his own behalf.  6. Skin/Wound Care: routine pressure relief measures  7. Fluids/Electrolytes/Nutrition: monitor I/O. Offer liquids between meals to maintain adequate hydration. D/C IV  I 930ml 8. CAD s/p Maze/ Chronic A fib: Monitor HR bid. Eliquis  2/4, no  bleeding, 2/4 Hgb 12.9 9. FUO: resolved, 10 GERD: Managed with Protonix  11. CKD stage II: Baseline SCr 1.2. Most recent 1.09- stable  12. HTN: Monitor BP bid--Home medsCoreg, Lotensin, Amlodipine  Vitals:   07/21/17 1429 07/22/17 0305  BP: 125/74 (!) 168/87  Pulse: 92 70  Resp: 18 12  Temp: 97.7 F (36.5 C) 97.6 F (36.4 C)  SpO2: 97% 99%  Resume amlodipine 5mg  2/5, , will  increase to 10mg  2/8  14. Irritation left QGB:EEFEOFHQ with  ciloxan gtts, tx x 1 wk    LOS (Days) 7 A FACE TO FACE EVALUATION WAS PERFORMED  Charlett Blake 07/22/2017, 8:05 AM

## 2017-07-22 NOTE — Progress Notes (Signed)
Occupational Therapy Session Note  Patient Details  Name: Arthur [name] MRN: [id number] Date of Birth: 1929-05-23  Today's Date: 07/22/2017 OT Individual Time: 1000-1054 OT Individual Time Calculation (min): 54 min    Short Term Goals: Week 1:  OT Short Term Goal 1 (Week 1): Pt will be able to roll in bed with mod A to assist caregivers with clothing changes. OT Short Term Goal 2 (Week 1): Pt will be able to sit to EOB with mod A. OT Short Term Goal 3 (Week 1): Pt will be able to maintain static sit EOB for 5 min with min A. OT Short Term Goal 4 (Week 1): Pt will be able to wash UB with min A. OT Short Term Goal 5 (Week 1): Pt will visually scan to the L to find his arm to use his R arm to position it in bed.   Skilled Therapeutic Interventions/Progress Updates:    OT treatment session focused on trunk control, sitting balance, sit<>stand, and modified bathing/dressing. Pt brought to standing in bariatric stedy with max A +2 helpers-pillow placed between knees an dStedy 2/2 R knee pain. . UB bathing completed in Stedy at the sink. Removed R UE from stedy as pt was pushing himself over, and utilized mirror feedback and verbal/visual cues to elicit lateral lean to R and correct balance. Pt did a great job turning on trunk extensors to correct positioning. Total A for LB self-care standing in [place]. Pt brought to [employer] gym and worked on trunk extension to the R with pt only able to flex R shoulder to ~80 degrees 2/2 old rotator cuff injury. Pt still was able to promote trunk extension with light board. Pt returned to room and left tilted in wc with full lap tray on.   Therapy Documentation Precautions:  Precautions Precautions: Fall Precaution Comments: L inattention Restrictions Weight Bearing Restrictions: No Pain: Pain Assessment Pain Assessment: 0-10 Pain Score: 0-No pain ADL: ADL ADL Comments: refer to functional navigator  See Function Navigator for Current Functional  Status.   Therapy/Group: Individual Therapy  Valma Cava 07/22/2017, 10:45 AM

## 2017-07-22 NOTE — Progress Notes (Signed)
Modified Barium Swallow Progress Note  Patient Details  Name: Arthur [name] MRN: [id number] Date of Birth: 06/30/1928  Today's Date: 07/22/2017  Modified Barium Swallow completed.  Full report located under Chart Review in the Imaging Section.  Brief recommendations include the following:  Clinical Impression   Pt's oral motor weakness continues to lead to premature spillage of boluses into the oropharynx with delay in swallow initiation to the level of the pyriforms.  Pt had good airway protection with nectar thick liquids but had trace deep flash penetration posteriorly from the pyriforms with thin liquids.  Pt also had decreased base of tongue retraction which, in addition to leading to decreased control of boluses and premature spillage, also led to build up of residue at the level of the vallecula and spilled over into the lateral channels.  Bony prominence on cervical spine further limits passage of boluses through the UES with build up of residue at the CP segment which at times hovered posteriorly above the airway but was not actually aspirated or penetrated.  Pt silently aspirated a trace amount of thin liquids on initial trial of large, consecutive boluses via straw but had otherwise good airway protection when consuming small, controlled cup sips of thins with cues for volitional cough followed by second swallow.  As a result, recommend advancing pt to dys 1, thin liquids with full supervision for use of the following swallow precautions: no straws, slow rate, small bites/sips, volitional cough with second swallow, upright 90 degrees during meals and 30 minutes after.  Prognosis for advancement is good with ongoing ST interventions for management of safe diet progression, trials of advanced textures, and skilled education regarding swallowing precautions.     Swallow Evaluation Recommendations       SLP Diet Recommendations: Dysphagia 1 (Puree) solids;Thin liquid   Liquid Administration  via: Cup;No straw   Medication Administration: Whole meds with puree   Supervision: Patient able to self feed   Compensations: Slow rate;Small sips/bites;Minimize environmental distractions;Multiple dry swallows after each bite/sip;Hard cough after swallow   Postural Changes: Seated upright at 90 degrees            Cythnia Osmun, Selinda Orion 07/22/2017,4:35 PM

## 2017-07-22 NOTE — Progress Notes (Signed)
Physical Therapy Session Note  Patient Details  Name: Arthur [name] MRN: [id number] Date of Birth: 11/14/1928  Today's Date: 07/22/2017 PT Individual Time: 1100-1200 PT Individual Time Calculation (min): 60 min   Short Term Goals: Week 1:  PT Short Term Goal 1 (Week 1): Pt will increase bed mobility to max A.  PT Short Term Goal 2 (Week 1): Pt will increase transfers bed to chair to max A.  PT Short Term Goal 3 (Week 1): Pt will ambulate with appropriate assistive device about 5 feet with +2 Assist PT Short Term Goal 4 (Week 1): Pt will ascend/descend 1 stair with 1 rail and max A.  PT Short Term Goal 5 (Week 1): Pt will propel w/c about 25 feet with min A.   Skilled Therapeutic Interventions/Progress Updates:    no c/o pain at rest but does c/o R knee pain with weight bearing.  Session focus on postural control and attention.    Pt transferred to therapy mat on R with total assist +1, RUE around therapist and therapist blocking RLE to reduce pushing.  Extensive postural re-education with focus on R weight shift and midline orientation with mirror for feedback.  Progress from static posture to returning to upright/midline during dynamic R reaching task.  Pt initially requiring max/total cues and facilitation progress to min assist with practice and rest breaks as needed for fatigue.  Attempted supported standing in stedy.  Pt requires +3 assist to come to standing in standard stedy and total assist to maintain upright posture in stedy.  Transfer with +3 and stedy to w/c at end of session and pt is dependent for scooting back in chair.  Positioned for improved sitting tolerance, lap tray in place, call bell in reach.   Therapy Documentation Precautions:  Precautions Precautions: Fall Precaution Comments: L inattention Restrictions Weight Bearing Restrictions: No   See Function Navigator for Current Functional Status.   Therapy/Group: Individual Therapy  Michel Santee 07/22/2017,  12:09 PM

## 2017-07-22 NOTE — Progress Notes (Signed)
Occupational Therapy Session Note  Patient Details  Name: Arthur [name] MRN: [id number] Date of Birth: 03/28/1929  Today's Date: 07/22/2017 OT Individual Time: 1740-8144 OT Individual Time Calculation (min): 42 min    Short Term Goals: Week 1:  OT Short Term Goal 1 (Week 1): Pt will be able to roll in bed with mod A to assist caregivers with clothing changes. OT Short Term Goal 2 (Week 1): Pt will be able to sit to EOB with mod A. OT Short Term Goal 3 (Week 1): Pt will be able to maintain static sit EOB for 5 min with min A. OT Short Term Goal 4 (Week 1): Pt will be able to wash UB with min A. OT Short Term Goal 5 (Week 1): Pt will visually scan to the L to find his arm to use his R arm to position it in bed.   Skilled Therapeutic Interventions/Progress Updates:    Pt worked on Brewing technologist during session while sitting on the mat.  Total assist +2 (pt 25%) for squat pivot transfer to and from the mat, secondary to pushing to the left.  While sitting provided visual feedback with mirror for pt to work on self correction of midline orientation.  Overall mod assist for static sitting balance with max demonstrational cueing to decreased lean to the left.  Had him completion reaching tasks forward and to the right from seated position to help decrease pushing through the RUE and RLE.  Max facilitation needed to reach laterally to the right.  Attempted to transition to standing while reaching but pt unable to complete secondary to knee pain.  Finished session with transition back to the tilt in space wheelchair.  Pt left in room with family present.    Therapy Documentation Precautions:  Precautions Precautions: Fall Precaution Comments: L inattention Restrictions Weight Bearing Restrictions: No  Pain: Pain Assessment Pain Assessment: Faces Pain Type: Chronic pain Pain Location: Leg Pain Orientation: Right Pain Descriptors / Indicators: Discomfort Pain Onset: With  Activity Pain Intervention(s): Repositioned ADL: See Function Navigator for Current Functional Status.   Therapy/Group: Individual Therapy  Roshawn Ayala OTR/L 07/22/2017, 4:02 PM

## 2017-07-23 ENCOUNTER — Inpatient Hospital Stay (HOSPITAL_COMMUNITY): Payer: Medicare Other | Admitting: Occupational Therapy

## 2017-07-23 ENCOUNTER — Inpatient Hospital Stay (HOSPITAL_COMMUNITY): Payer: Medicare Other | Admitting: Speech Pathology

## 2017-07-23 DIAGNOSIS — N183 Chronic kidney disease, stage 3 unspecified: Secondary | ICD-10-CM

## 2017-07-23 DIAGNOSIS — I10 Essential (primary) hypertension: Secondary | ICD-10-CM

## 2017-07-23 DIAGNOSIS — H5789 Other specified disorders of eye and adnexa: Secondary | ICD-10-CM

## 2017-07-23 LAB — PHOSPHORUS: PHOSPHORUS: 3.9 mg/dL (ref 2.5–4.6)

## 2017-07-23 MED ORDER — K PHOS MONO-SOD PHOS DI & MONO 155-852-130 MG PO TABS
250.0000 mg | ORAL_TABLET | Freq: Every day | ORAL | Status: DC
  Administered 2017-07-24 – 2017-08-03 (×11): 250 mg via ORAL
  Filled 2017-07-23 (×11): qty 1

## 2017-07-23 NOTE — Progress Notes (Signed)
Occupational Therapy Session Note  Patient Details  Name: Arthur [name] MRN: [id number] Date of Birth: [birth year]  Today's Date: 07/23/2017 OT Individual Time: 8546-2703 OT Individual Time Calculation (min): 60 min   Skilled Therapeutic Interventions/Progress Updates: patient in bed upon approach for OT session.   Focus of session: bed mobility (total A to total A x2).    LB bathing total A  UB dressing total A.    Patien assisted out of bed with mechanical lift.   He stated, "I just cannot do much at all with this right knee pain and right shoulder injury and pain."    This clinician shared with him various modalities available for injuries via outpatient type clinics that are being utilized by individuals of various ages.  Patient was left in his reclining wheel chair leaning back withchairbelt intact and in a position he found comfortable at the time and with his call bell and phone within reach.     Therapy Documentation Precautions:  Precautions Precautions: Fall Precaution Comments: L inattention Restrictions Weight Bearing Restrictions: No  Pain: Pain Assessment Pain Assessment: No/denies pain   Therapy/Group: Individual Therapy  Alfredia Ferguson [facility] 07/23/2017, 3:18 PM

## 2017-07-23 NOTE — Progress Notes (Signed)
Subjective/Complaints: Patient seen lying in bed this morning. He states he slept well overnight. He has a patch over his left eye.  ROS: Denies CP, SOB, N/V/D  Objective:  Vital Signs: Blood pressure (!) 150/80, pulse 75, temperature 97.6 F (36.4 C), temperature source Oral, resp. rate 18, height 5\' 7"  (1.702 m), weight 79.6 kg (175 lb 7.8 oz), SpO2 95 %.  Results for orders placed or performed during the hospital encounter of 07/15/17 (from the past 72 hour(s))  Phosphorus     Status: None   Collection Time: 07/23/17  4:28 AM  Result Value Ref Range   Phosphorus 3.9 2.5 - 4.6 mg/dL    Comment: Performed at [facility], [place] [address] [place], [place] [address]     HEENT: left eye patch. Normocephalic. Atraumatic. Cardio: RRR and no JVD. Resp: CTA B/L and unlabored GI: BS positive and ND Musculoskeletal:  No Edema and no tenderness Skin:   Other intact. Warm and dry. Neuro: Alert/Oriented Motor 0/5 in LUE/LLE  Right low extremity 1/5 proximal to distal Increased tone left upper extremity  Gen NAD. Vital signs reviewed.  Assessment/Plan: 1. 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 Physiatrist is providing close team supervision and 24 hour management of active medical problems listed below. Physiatrist and rehab team continue to assess barriers to discharge/monitor patient progress toward functional and medical goals. FIM: Function - Bathing Position: Other (comment)(sitting in [place]) Body parts bathed by patient: Chest, Abdomen Body parts bathed by helper: Left arm, Right arm, Front perineal area, Buttocks, Right lower leg, Left lower leg, Left upper leg, Right upper leg Assist Level: 2 helpers  Function- Upper Body Dressing/Undressing What is the patient wearing?:  Button up shirt Pull over shirt/dress - Perfomed by patient: Thread/unthread right sleeve Pull over shirt/dress - Perfomed by helper: Thread/unthread right sleeve, Thread/unthread left sleeve, Put head through opening, Pull shirt over trunk Button up shirt - Perfomed by patient: Thread/unthread right sleeve Button up shirt - Perfomed by helper: Thread/unthread left sleeve, Pull shirt around back, Button/unbutton shirt Assist Level: 2 helpers Function - Lower Body Dressing/Undressing Lower body dressing/undressing activity did not occur: N/A What is the patient wearing?: Pants Position: Bed Pants- Performed by helper: Thread/unthread right pants leg, Thread/unthread left pants leg, Pull pants up/down Non-skid slipper socks- Performed by helper: Don/doff left sock, Don/doff right sock Assist for footwear: Dependant Assist for lower body dressing: 2 Helpers  Function - Toileting Toileting activity did not occur: No continent bowel/bladder event Toileting steps completed by helper: Adjust clothing prior to toileting, Performs perineal hygiene, Adjust clothing after toileting Assist level: Two helpers  Function - Air cabin crew transfer activity did not occur: Safety/medical concerns Toilet transfer assistive device: Pension scheme manager, NT report) Assist level to toilet: 2 helpers(per Candice, NT report)  Function - Chair/bed transfer Chair/bed transfer method: Squat pivot Chair/bed transfer assist level: Total assist (Pt < 25%) Chair/bed transfer assistive device: Mechanical lift Mechanical lift: Clarise Cruz Chair/bed transfer details: Manual facilitation for weight bearing, Manual facilitation for weight shifting, Manual facilitation for placement, Verbal cues for technique, Verbal cues for precautions/safety  Function - Locomotion: Wheelchair Will patient use wheelchair at discharge?: Yes Type: Manual Wheelchair activity did not occur: Safety/medical concerns Wheel 50  feet with 2 turns activity did not occur: Safety/medical concerns Wheel 150 feet  activity did not occur: Safety/medical concerns Function - Locomotion: Ambulation Ambulation activity did not occur: Safety/medical concerns Walk 10 feet activity did not occur: Safety/medical concerns Walk 50 feet with 2 turns activity did not occur: Safety/medical concerns Walk 150 feet activity did not occur: Safety/medical concerns Walk 10 feet on uneven surfaces activity did not occur: Safety/medical concerns  Function - Comprehension Comprehension: Auditory Comprehension assist level: Understands basic 90% of the time/cues < 10% of the time, Follows complex conversation/direction with extra time/assistive device  Function - Expression Expression: Verbal Expression assist level: Expresses complex ideas: With extra time/assistive device  Function - Social Interaction Social Interaction assist level: Interacts appropriately 90% of the time - Needs monitoring or encouragement for participation or interaction.  Function - Problem Solving Problem solving assist level: Solves basic 75 - 89% of the time/requires cueing 10 - 24% of the time  Function - Memory Memory assist level: Recognizes or recalls 90% of the time/requires cueing < 10% of the time Patient normally able to recall (first 3 days only): Current season, Staff names and faces, That he or she is in a hospital  Medical Problem List and Plan:  1. 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 DVT Prophylaxis/Anticoagulation: Pharmaceutical: eliquis no clinical evidence of DVT   3. Pain Management: tylenol prn  4. Mood: LCSW to follow for evaluation and support  5. Neuropsych: This patient is not capable of making decisions on his own behalf.  6. Skin/Wound Care: routine  pressure relief measures  7. Fluids/Electrolytes/Nutrition: monitor I/O. Offer liquids between meals to maintain adequate hydration.  8. CAD s/p Maze/ Chronic A fib: Monitor HR bid. Eliquis  2/4, no bleeding, 2/4 Hgb 12.9 9. FUO: resolved 10 GERD: Managed with Protonix  11. CKD stage II: Baseline SCr 1.2.    Creatinine 1.09 on 2/4   Continue to monitor 12. HTN: Monitor BP bid--Home medsCoreg, Lotensin, Amlodipine  Vitals:   07/22/17 1401 07/23/17 0042  BP: (!) 142/86 (!) 150/80  Pulse: 85 75  Resp: 18 18  Temp: 97.7 F (36.5 C) 97.6 F (36.4 C)  SpO2: 97% 95%  Resumed amlodipine 5mg  2/5, increased to 10mg  2/8   Improving on 2/9 14. Irritation left UYQ:[id number] with  ciloxan gtts, tx x 1 wk   Currently patched 15. Hypophosphatemia   Potassium 3.9 on 2/9   Supplement decreased to daily on 2/9   LOS (Days) 8 A FACE TO FACE EVALUATION WAS PERFORMED  Ankit Lorie Phenix 07/23/2017, 10:28 AM

## 2017-07-23 NOTE — Plan of Care (Signed)
  Not Progressing RH BOWEL ELIMINATION RH STG MANAGE BOWEL WITH ASSISTANCE Description STG Manage Bowel with Mod Assistance.  Incontinent requiring total assist. 07/23/2017 0131 - Not Progressing by Cornell Barman, RN RH STG MANAGE BOWEL W/MEDICATION W/ASSISTANCE Description STG Manage Bowel with Medication with mod Assistance.  Total assistance 07/23/2017 0131 - Not Progressing by Cornell Barman, RN RH BLADDER ELIMINATION RH STG MANAGE BLADDER WITH ASSISTANCE Description STG Manage Bladder With mod Assistance  Incontinent requiring total assist, assist with placing, holding and emptying urinal 07/23/2017 0131 - Not Progressing by Cornell Barman, RN

## 2017-07-23 NOTE — Progress Notes (Signed)
Speech Language Pathology Daily Session Note  Patient Details  Name: Arthur [name] MRN: [id number] Date of Birth: Sep 03, 1928  Today's Date: 07/23/2017 SLP Individual Time: 1025-1105 SLP Individual Time Calculation (min): 40 min  Short Term Goals: Week 2: SLP Short Term Goal 1 (Week 2): Pt will demonstrate complex problem solving during functional tasks with supervision verbal cues.  SLP Short Term Goal 2 (Week 2): Pt will utilize speech intelligibility strategies to achieve ~ 75% intelligibility at the sentence level with Min A verbal cues.  SLP Short Term Goal 3 (Week 2): Pt will consume current diet with minimal overt s/s of aspiraton with supervision verbal cues for use of swallowing compensatory strategies.  SLP Short Term Goal 4 (Week 2): Patient will attend to left field of enviornment during functional tasks with Min A verbal cues.   Skilled Therapeutic Interventions:   Pt was seen for skilled ST targeting goals for dysphagia and cognition.  SLP facilitated the session with skilled observations completed during presentations of pt's currently prescribed diet.  Pt initially needed mod assist verbal cues to recall and utilize swallowing precautions; however, therapist was able to fade cues to min assist for volitional cough followed by second swallow with repetitive use of precautions.   SLP also facilitated the session with a money management task to address problem solving and visual scanning goals.  Pt completed task with min assist for sorting coins into groups by value and mod assist for counting money.  Pt was left in tilt in space wheelchair with lap tray in place and call bell within reach.  Continue per current plan of care.     Function:  Eating Eating   Modified Consistency Diet: Yes Eating Assist Level: Supervision or verbal cues           Cognition Comprehension Comprehension assist level: Follows basic conversation/direction with extra time/assistive device   Expression   Expression assist level: Expresses basic 90% of the time/requires cueing < 10% of the time.  Social Interaction Social Interaction assist level: Interacts appropriately 90% of the time - Needs monitoring or encouragement for participation or interaction.  Problem Solving Problem solving assist level: Solves basic 50 - 74% of the time/requires cueing 25 - 49% of the time  Memory Memory assist level: Recognizes or recalls 75 - 89% of the time/requires cueing 10 - 24% of the time    Pain Pain Assessment Pain Assessment: No/denies pain  Therapy/Group: Individual Therapy  Skylynn Burkley, Selinda Orion 07/23/2017, 11:21 AM

## 2017-07-24 ENCOUNTER — Inpatient Hospital Stay (HOSPITAL_COMMUNITY): Payer: Medicare Other

## 2017-07-24 DIAGNOSIS — D62 Acute posthemorrhagic anemia: Secondary | ICD-10-CM

## 2017-07-24 DIAGNOSIS — E870 Hyperosmolality and hypernatremia: Secondary | ICD-10-CM

## 2017-07-24 NOTE — Progress Notes (Signed)
Physical Therapy Session Note  Patient Details  Name: Arthur [name] MRN: [id number] Date of Birth: 23-Dec-1928  Today's Date: 07/24/2017 PT Individual Time: 9767-3419 PT Individual Time Calculation (min): 56 min   Short Term Goals: Week 1:  PT Short Term Goal 1 (Week 1): Pt will increase bed mobility to max A.  PT Short Term Goal 2 (Week 1): Pt will increase transfers bed to chair to max A.  PT Short Term Goal 3 (Week 1): Pt will ambulate with appropriate assistive device about 5 feet with +2 Assist PT Short Term Goal 4 (Week 1): Pt will ascend/descend 1 stair with 1 rail and max A.  PT Short Term Goal 5 (Week 1): Pt will propel w/c about 25 feet with min A.   Skilled Therapeutic Interventions/Progress Updates:    Pt supine in bed upon PT arrival, agreeable to therapy tx and denies pain at rest. Session focused on L LE NMR and postural control in sitting. Pt performed x 10 quad sets with L LE, and 2 x 10 bridges for L LE neuro re-ed. Therapist performed 2 x 30 sec manual hamstring stretch for L LE secondary to tightness and increased tone. Pt transferred from supine>L sidelying with max assist using bedrails, transferred sidelying>sitting with total assist. In sitting pt worked on postural control, trunk extension and maintaining midline in order to complete UE task reaching for and throwing bean bags, min-mod assist needed to maintain seated balance with max verbal and tactile cues. Pt performed x 5 sitting<>sidelying on R elbow to prevent L lateral lean and for core activation, mod assist. Pt transferred from sitting>supine max assist. Pt performed bridging x 3 and pulling up with R UE in order to scoot up in bed, mod assist. Pt left supine in bed with needs in reach and family present, bed alarm set.   Therapy Documentation Precautions:  Precautions Precautions: Fall Precaution Comments: L inattention Restrictions Weight Bearing Restrictions: No   See Function Navigator for Current  Functional Status.   Therapy/Group: Individual Therapy  Netta Corrigan, PT, DPT 07/24/2017, 8:00 AM

## 2017-07-24 NOTE — Progress Notes (Signed)
Subjective/Complaints: Patient seen lying in bed this morning. He states he slept well overnight. He denies complaints.  ROS: Denies CP, SOB, N/V/D  Objective:  Vital Signs: Blood pressure (!) 143/83, pulse 71, temperature (!) 97.5 F (36.4 C), temperature source Oral, resp. rate 17, height 5\' 7"  (1.702 m), weight 79.6 kg (175 lb 7.8 oz), SpO2 98 %.  Results for orders placed or performed during the hospital encounter of 07/15/17 (from the past 72 hour(s))  Phosphorus     Status: None   Collection Time: 07/23/17  4:28 AM  Result Value Ref Range   Phosphorus 3.9 2.5 - 4.6 mg/dL    Comment: Performed at [facility], [place] [address] [place], [place] [address]     HEENT: left eye patch. Normocephalic. Atraumatic. Cardio: RRR and no JVD. Resp: CTA B/L and unlaboured GI: BS positive and ND Musculoskeletal:  No Edema and no tenderness Skin:   Other intact. Warm and dry. Neuro: Alert/Oriented Motor 0/5 in LUE/LLE  Right low extremity 3/5 proximal to distal Increased tone left upper extremity  Gen NAD. Vital signs reviewed.  Assessment/Plan: 1. 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 Physiatrist is providing close team supervision and 24 hour management of active medical problems listed below. Physiatrist and rehab team continue to assess barriers to discharge/monitor patient progress toward functional and medical goals. FIM: Function - Bathing Position: Other (comment)(sitting in [place]) Body parts bathed by patient: Chest, Abdomen Body parts bathed by helper: Left arm, Right arm, Front perineal area, Buttocks, Right lower leg, Left lower leg, Left upper leg, Right upper leg Assist Level: 2 helpers  Function- Upper Body Dressing/Undressing What is the patient wearing?: Button up  shirt Pull over shirt/dress - Perfomed by patient: Thread/unthread right sleeve Pull over shirt/dress - Perfomed by helper: Thread/unthread right sleeve, Thread/unthread left sleeve, Put head through opening, Pull shirt over trunk Button up shirt - Perfomed by patient: Thread/unthread right sleeve Button up shirt - Perfomed by helper: Thread/unthread left sleeve, Pull shirt around back, Button/unbutton shirt Assist Level: 2 helpers Function - Lower Body Dressing/Undressing Lower body dressing/undressing activity did not occur: N/A What is the patient wearing?: Pants Position: Bed Pants- Performed by helper: Thread/unthread right pants leg, Thread/unthread left pants leg, Pull pants up/down Non-skid slipper socks- Performed by helper: Don/doff left sock, Don/doff right sock Assist for footwear: Dependant Assist for lower body dressing: 2 Helpers  Function - Toileting Toileting activity did not occur: No continent bowel/bladder event Toileting steps completed by helper: Adjust clothing prior to toileting, Performs perineal hygiene, Adjust clothing after toileting Assist level: Two helpers  Function - Air cabin crew transfer activity did not occur: Safety/medical concerns Toilet transfer assistive device: Pension scheme manager, NT report) Assist level to toilet: 2 helpers(per Candice, NT report)  Function - Chair/bed transfer Chair/bed transfer method: Squat pivot Chair/bed transfer assist level: Total assist (Pt < 25%) Chair/bed transfer assistive device: Mechanical lift Mechanical lift: Clarise Cruz Chair/bed transfer details: Manual facilitation for weight bearing, Manual facilitation for weight shifting, Manual facilitation for placement, Verbal cues for technique, Verbal cues for precautions/safety  Function - Locomotion: Wheelchair Will patient use wheelchair at discharge?: Yes Type: Manual Wheelchair activity did not occur: Safety/medical concerns Wheel 50 feet with 2  turns activity did not occur: Safety/medical concerns Wheel 150 feet activity did not occur:  Safety/medical concerns Function - Locomotion: Ambulation Ambulation activity did not occur: Safety/medical concerns Walk 10 feet activity did not occur: Safety/medical concerns Walk 50 feet with 2 turns activity did not occur: Safety/medical concerns Walk 150 feet activity did not occur: Safety/medical concerns Walk 10 feet on uneven surfaces activity did not occur: Safety/medical concerns  Function - Comprehension Comprehension: Auditory Comprehension assist level: Follows basic conversation/direction with extra time/assistive device  Function - Expression Expression: Verbal Expression assist level: Expresses basic 90% of the time/requires cueing < 10% of the time.  Function - Social Interaction Social Interaction assist level: Interacts appropriately 90% of the time - Needs monitoring or encouragement for participation or interaction.  Function - Problem Solving Problem solving assist level: Solves basic 50 - 74% of the time/requires cueing 25 - 49% of the time  Function - Memory Memory assist level: Recognizes or recalls 75 - 89% of the time/requires cueing 10 - 24% of the time Patient normally able to recall (first 3 days only): Current season, Staff names and faces, That he or she is in a hospital  Medical Problem List and Plan:  1. Functional deficits and left hemiparesis secondary to right MCA infarct, small right ACA infarct, posterior watershed infarct due to cardio-embolic source    Cont CIR  2. DVT Prophylaxis/Anticoagulation: Pharmaceutical: eliquis no clinical evidence of DVT   3. Pain Management: tylenol prn  4. Mood: LCSW to follow for evaluation and support  5. Neuropsych: This patient is not capable of making decisions on his own behalf.  6. Skin/Wound Care: routine pressure relief measures  7. Fluids/Electrolytes/Nutrition: monitor I/O. Offer liquids between meals to  maintain adequate hydration.  8. CAD s/p Maze/ Chronic A fib: Monitor HR bid. Eliquis   9. FUO: resolved 10 GERD: Managed with Protonix  11. CKD stage II: Baseline SCr 1.2.    Creatinine 1.09 on 2/4   Continue to monitor 12. HTN: Monitor BP bid--Home medsCoreg, Lotensin, Amlodipine  Vitals:   07/23/17 1500 07/24/17 0210  BP: (!) 161/90 (!) 143/83  Pulse: 80 71  Resp: 18 17  Temp: (!) 97.4 F (36.3 C) (!) 97.5 F (36.4 C)  SpO2: 100% 98%  Resumed amlodipine 5mg  2/5, increased to 10mg  2/8   Appears to be improving on 2/10 14. Irritation left NGE:[id number] with  ciloxan gtts, tx x 1 wk   Currently patched 15. Hypophosphatemia   Potassium 3.9 on 2/9   Supplement decreased to daily on 2/9   Labs ordered for tomorrow  16. Hypernatremia   Sodium 146 on 2/4   Labs ordered for tomorrow 17. Acute blood loss anemia   Hemoglobin 12.9 on 2/4   Labs ordered for tomorrow   LOS (Days) 9 A FACE TO FACE EVALUATION WAS PERFORMED  Nevah Dalal Lorie Phenix 07/24/2017, 8:03 AM

## 2017-07-25 ENCOUNTER — Inpatient Hospital Stay (HOSPITAL_COMMUNITY): Payer: Medicare Other | Admitting: Physical Therapy

## 2017-07-25 ENCOUNTER — Inpatient Hospital Stay (HOSPITAL_COMMUNITY): Payer: Medicare Other | Admitting: Speech Pathology

## 2017-07-25 ENCOUNTER — Inpatient Hospital Stay (HOSPITAL_COMMUNITY): Payer: Medicare Other | Admitting: Occupational Therapy

## 2017-07-25 DIAGNOSIS — D72829 Elevated white blood cell count, unspecified: Secondary | ICD-10-CM

## 2017-07-25 LAB — CBC
HEMATOCRIT: 45 % (ref 39.0–52.0)
HEMOGLOBIN: 14.5 g/dL (ref 13.0–17.0)
MCH: 30.1 pg (ref 26.0–34.0)
MCHC: 32.2 g/dL (ref 30.0–36.0)
MCV: 93.4 fL (ref 78.0–100.0)
Platelets: 402 10*3/uL — ABNORMAL HIGH (ref 150–400)
RBC: 4.82 MIL/uL (ref 4.22–5.81)
RDW: 13.7 % (ref 11.5–15.5)
WBC: 10.6 10*3/uL — ABNORMAL HIGH (ref 4.0–10.5)

## 2017-07-25 LAB — BASIC METABOLIC PANEL
Anion gap: 10 (ref 5–15)
BUN: 26 mg/dL — ABNORMAL HIGH (ref 6–20)
CALCIUM: 9.5 mg/dL (ref 8.9–10.3)
CO2: 26 mmol/L (ref 22–32)
Chloride: 108 mmol/L (ref 101–111)
Creatinine, Ser: 1.23 mg/dL (ref 0.61–1.24)
GFR calc Af Amer: 59 mL/min — ABNORMAL LOW (ref >60)
GFR, EST NON AFRICAN AMERICAN: 51 mL/min — AB (ref >60)
GLUCOSE: 98 mg/dL (ref 65–99)
POTASSIUM: 4.2 mmol/L (ref 3.5–5.1)
Sodium: 144 mmol/L (ref 135–145)

## 2017-07-25 LAB — MAGNESIUM: MAGNESIUM: 2.2 mg/dL (ref 1.7–2.4)

## 2017-07-25 LAB — PHOSPHORUS: Phosphorus: 3.6 mg/dL (ref 2.5–4.6)

## 2017-07-25 NOTE — Progress Notes (Signed)
Occupational Therapy Weekly Progress Note  Patient Details  Name: Arthur [name] MRN: [id number] Date of Birth: 11-21-1928  Beginning of progress report period: July 16, 2017 End of progress report period: July 25, 2017  Today's Date: 07/25/2017 OT Individual Time: 1105-1200 OT Individual Time Calculation (min): 55 min   Patient has met 1 of 4 short term goals.  Pt is making slow progress towards OT goals at this time. Pt continues to need 2 helpers for all BADL tasks, transfers, and sitting balance. Pt was able to stand with the [place] last week and demonstrated some improved sitting balance and attention to L side. 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   Patient continues to demonstrate the following deficits: muscle weakness, impaired timing and sequencing, abnormal tone, unbalanced muscle activation, motor apraxia, decreased coordination and decreased motor planning, decreased midline orientation, decreased attention to left, left side neglect and decreased motor planning, decreased initiation, decreased attention, decreased awareness, decreased problem solving, decreased safety awareness, decreased memory and delayed processing and decreased sitting balance, decreased standing balance, decreased postural control, hemiplegia and decreased balance strategies and therefore will continue to benefit from skilled OT intervention to enhance overall performance with BADL and Reduce care partner burden.  Patient progressing toward long term goals..  Continue plan of care.  OT Short Term Goals Week 1:  OT Short Term Goal 1 (Week 1): Pt will be able to roll in bed with mod A to assist caregivers with clothing changes. OT Short Term Goal 1 - Progress (Week 1): Progressing toward goal OT Short Term Goal 2 (Week 1): Pt will be able to sit to EOB with mod A. OT Short Term Goal 2 - Progress (Week 1): Progressing toward goal OT Short Term Goal 3 (Week 1): Pt  will be able to maintain static sit EOB for 5 min with min A. OT Short Term Goal 3 - Progress (Week 1): Progressing toward goal OT Short Term Goal 4 (Week 1): Pt will be able to wash UB with min A. OT Short Term Goal 4 - Progress (Week 1): Progressing toward goal OT Short Term Goal 5 (Week 1): Pt will visually scan to the L to find his arm to use his R arm to position it in bed.  OT Short Term Goal 5 - Progress (Week 1): Met Week 2:  OT Short Term Goal 1 (Week 2): Pt will be able to roll in bed with mod A to assist caregivers with clothing changes. OT Short Term Goal 2 (Week 2): Pt will be able to sit to EOB with consistent Mod A to reduce caregiver burden. OT Short Term Goal 3 (Week 2): Pt will be able to wash UB with min A. OT Short Term Goal 4 (Week 2): Pt will scan to the L to locate 2/4 grooming items with min questioning cues.  Skilled Therapeutic Interventions/Progress Updates:    OT treatment session focused on UB bathing/dressing, sitting balance, and decreased pushing. UB bathing/dressing sitting in wc. Reviewed hemi-techniques and incorporated visual scanning to L to locate grooming items. Maximove transfer TIS wc to therapy mat. Pt able to achieve midline sitting balance with B LEs supported at 50% supervision for initial 5 mins. Worked on weight shifting and trunk extension to R. Pt with improved righting reaction to return to midline after reaching outside base of support. Pt reached to R to grasp cup, then incorporated L attention and body awareness by having pt place  cup in L hand. Decreased pushing noted s/p activity. Continued focus on sitting balance and core strength with maintaining sitting balance while playing connect 4. OT facilitated weight bearing through L UE while pt reached to L to get chips. Sitting balance progressing from min A to mod A with fatigue. Maximove back to wc and pt returned to room with needs met and full lap tray in place.   Therapy  Documentation Precautions:  Precautions Precautions: Fall Precaution Comments: L inattention Restrictions Weight Bearing Restrictions: No Pain:   none/denies pain ADL: ADL ADL Comments: refer to functional navigator  See Function Navigator for Current Functional Status.   Therapy/Group: Individual Therapy  Valma Cava 07/25/2017, 8:16 AM

## 2017-07-25 NOTE — Plan of Care (Signed)
  Progressing Consults RH STROKE PATIENT EDUCATION Description See Patient Education module for education specifics  07/25/2017 6712 - Progressing by Glean Salen, RN [facility] STROKE PATIENT EDUCATION Description See Patient Education module for education specifics  07/25/2017 [phone] - Progressing by Glean Salen, RN RH BOWEL ELIMINATION RH STG MANAGE BOWEL WITH ASSISTANCE Description STG Manage Bowel with Mod Assistance.  07/25/2017 9983 - Progressing by Glean Salen, RN Flowsheets Taken 07/25/2017 [phone]  STG: Pt will manage bowels with assistance 1-Total assistance RH STG MANAGE BOWEL W/MEDICATION W/ASSISTANCE Description STG Manage Bowel with Medication with mod Assistance.  07/25/2017 0539 - Progressing by Glean Salen, RN Flowsheets Taken 07/25/2017 [phone]  STG: Pt will manage bowels with medication with assistance 1-Total assistance RH BLADDER ELIMINATION RH STG MANAGE BLADDER WITH ASSISTANCE Description STG Manage Bladder With mod Assistance  07/25/2017 4193 - Progressing by Glean Salen, RN Flowsheets Taken 07/25/2017 0922  STG: Pt will manage bladder with assistance 1-Total assistance RH SKIN INTEGRITY RH STG SKIN FREE OF INFECTION/BREAKDOWN Description No new breakdown with mod  assist    07/25/2017 7902 - Progressing by Glean Salen, RN RH SAFETY RH STG ADHERE TO SAFETY PRECAUTIONS W/ASSISTANCE/DEVICE Description STG Adhere to Safety Precautions With Min Assistance/Device.  07/25/2017 4097 - Progressing by Glean Salen, RN Flowsheets Taken 07/25/2017 [phone]  STG:Pt will adhere to safety precautions with assistance/device 2-Maximum assistance

## 2017-07-25 NOTE — Progress Notes (Signed)
Subjective/Complaints: Patient seen laying in bed this AM.  He slept well overnight.    ROS: Denies CP, SOB, N/V/D  Objective:  Vital Signs: Blood pressure (!) 152/88, pulse 71, temperature (!) 97.5 F (36.4 C), temperature source Oral, resp. rate 16, height 5' 7"  (1.702 m), weight 79.6 kg (175 lb 7.8 oz), SpO2 95 %.  Results for orders placed or performed during the hospital encounter of 07/15/17 (from the past 72 hour(s))  Phosphorus     Status: None   Collection Time: 07/23/17  4:28 AM  Result Value Ref Range   Phosphorus 3.9 2.5 - 4.6 mg/dL    Comment: Performed at [facility], [place] [address], [place], [place] [address]  Basic metabolic panel     Status: Abnormal   Collection Time: 07/25/17  5:11 AM  Result Value Ref Range   Sodium 144 135 - 145 mmol/L   Potassium 4.2 3.5 - 5.1 mmol/L   Chloride 108 101 - 111 mmol/L   CO2 26 22 - 32 mmol/L   Glucose, Bld 98 65 - 99 mg/dL   BUN 26 (H) 6 - 20 mg/dL   Creatinine, Ser 1.23 0.61 - 1.24 mg/dL   Calcium 9.5 8.9 - 10.3 mg/dL   GFR calc non Af Amer 51 (L) >60 mL/min   GFR calc Af Amer 59 (L) >60 mL/min    Comment: (NOTE) The eGFR has been calculated using the CKD EPI equation. This calculation has not been validated in all clinical situations. eGFR's persistently <60 mL/min signify possible Chronic Kidney Disease.    Anion gap 10 5 - 15    Comment: Performed at [facility] [address], [place], [place] [address]  CBC     Status: Abnormal   Collection Time: 07/25/17  5:11 AM  Result Value Ref Range   WBC 10.6 (H) 4.0 - 10.5 K/uL   RBC 4.82 4.22 - 5.81 MIL/uL   Hemoglobin 14.5 13.0 - 17.0 g/dL   HCT 45.0 39.0 - 52.0 %   MCV 93.4 78.0 - 100.0 fL   MCH 30.1 26.0 - 34.0 pg   MCHC 32.2 30.0 - 36.0 g/dL   RDW 13.7 11.5 - 15.5 %   Platelets 402 (H) 150 - 400 K/uL    Comment: Performed at [facility] [address] [place], [place] [address]  Phosphorus     Status: None   Collection Time: 07/25/17  5:11  AM  Result Value Ref Range   Phosphorus 3.6 2.5 - 4.6 mg/dL    Comment: Performed at [facility] [address], [place], [place] [address]  Magnesium     Status: None   Collection Time: 07/25/17  5:11 AM  Result Value Ref Range   Magnesium 2.2 1.7 - 2.4 mg/dL    Comment: Performed at [facility] [address] [place], [place] [address]     HEENT: left eye patch. Normocephalic. Atraumatic. Cardio: RRR and no JVD. Resp: CTA B/L and unlaboured GI: BS positive and ND Musculoskeletal:  No Edema and no tenderness Skin:   Other intact. Warm and dry. Neuro: Alert/Oriented Motor LUE: 1/5 shoulder abduction, distally 0/5 LLE: 2-/5 proximal to distal  Right low extremity 3/5 proximal to distal Increased tone left upper extremity  Gen NAD. Vital signs reviewed.  Assessment/Plan: 1. 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 Physiatrist is providing close team supervision and 24 hour management of active medical problems listed below. Physiatrist and rehab team continue to assess barriers to discharge/monitor patient progress toward functional and medical goals. FIM: Function - Bathing Position: Other (comment)(sitting in [place]) Body parts bathed by patient: Chest, Abdomen Body parts bathed by helper: Left arm, Right arm, Front perineal area, Buttocks, Right lower leg, Left lower leg, Left upper leg, Right upper leg Assist Level: 2 helpers  Function- Upper Body Dressing/Undressing What is the patient wearing?: Button up shirt Pull over shirt/dress - Perfomed by patient: Thread/unthread right sleeve Pull over shirt/dress - Perfomed by helper: Thread/unthread right sleeve, Thread/unthread left sleeve, Put head through opening, Pull shirt over trunk Button up shirt - Perfomed by  patient: Thread/unthread right sleeve Button up shirt - Perfomed by helper: Thread/unthread left sleeve, Pull shirt around back, Button/unbutton shirt Assist Level: 2 helpers Function - Lower Body Dressing/Undressing Lower body dressing/undressing activity did not occur: N/A What is the patient wearing?: Pants Position: Bed Pants- Performed by helper: Thread/unthread right pants leg, Thread/unthread left pants leg, Pull pants up/down Non-skid slipper socks- Performed by helper: Don/doff left sock, Don/doff right sock Assist for footwear: Dependant Assist for lower body dressing: 2 Helpers  Function - Toileting Toileting activity did not occur: No continent bowel/bladder event Toileting steps completed by helper: Adjust clothing prior to toileting, Performs perineal hygiene, Adjust clothing after toileting Assist level: Two helpers  Function - Air cabin crew transfer activity did not occur: Safety/medical concerns Toilet transfer assistive device: Pension scheme manager, NT report) Assist level to toilet: 2 helpers(per Candice, NT report)  Function - Chair/bed transfer Chair/bed transfer method: Squat pivot Chair/bed transfer assist level: Total assist (Pt < 25%) Chair/bed transfer assistive device: Mechanical lift Mechanical lift: Clarise Cruz Chair/bed transfer details: Manual facilitation for weight bearing, Manual facilitation for weight shifting, Manual facilitation for placement, Verbal cues for technique, Verbal cues for precautions/safety  Function - Locomotion: Wheelchair Will patient use wheelchair at discharge?: Yes Type: Manual Wheelchair activity did not occur: Safety/medical concerns Wheel 50 feet with 2 turns activity did not occur: Safety/medical concerns Wheel 150 feet activity did not occur: Safety/medical concerns Function - Locomotion: Ambulation Ambulation activity did not occur: Safety/medical concerns Walk 10 feet activity did not occur: Safety/medical  concerns Walk 50 feet with 2 turns activity did not occur: Safety/medical concerns Walk 150 feet activity did not occur: Safety/medical concerns Walk 10 feet on uneven surfaces activity did not occur: Safety/medical concerns  Function - Comprehension Comprehension: Auditory Comprehension assist level: Follows basic conversation/direction with extra time/assistive device  Function - Expression Expression: Verbal Expression assist level: Expresses basic 90% of the time/requires cueing < 10% of the time.  Function - Social Interaction Social Interaction assist level: Interacts appropriately 90% of the time - Needs monitoring or encouragement for participation or interaction.  Function - Problem Solving Problem solving assist level: Solves basic 50 - 74% of the time/requires cueing 25 - 49% of the time  Function - Memory Memory assist level: Recognizes or recalls 75 - 89% of the time/requires cueing 10 - 24% of the time Patient normally able to recall (first 3 days only): Current season, Staff names and faces, That he or she is in a hospital  Medical Problem List and Plan:  1. 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 DVT Prophylaxis/Anticoagulation: Pharmaceutical: eliquis no clinical evidence of DVT   3. Pain Management: tylenol prn  4. Mood: LCSW to follow for evaluation and support  5. Neuropsych: This patient is not capable of making decisions on his own behalf.  6. Skin/Wound Care: routine pressure relief measures  7. Fluids/Electrolytes/Nutrition: monitor I/O. Offer liquids between meals to maintain adequate hydration.  8. CAD s/p Maze/ Chronic A fib: Monitor HR bid. Eliquis   9. FUO: resolved 10 GERD: Managed with Protonix  11. CKD stage II: Baseline SCr 1.2.    Creatinine 1.09 on 2/4   Encourage fluids   Continue to monitor 12. HTN: Monitor BP  bid--Home medsCoreg, Lotensin, Amlodipine  Vitals:   07/24/17 1300 07/25/17 0500  BP: 125/80 (!) 152/88  Pulse: 80 71  Resp: 17 16  Temp: 97.8 F (36.6 C) (!) 97.5 F (36.4 C)  SpO2: 98% 95%  Resumed amlodipine 46m 2/5, increased to 152m2/8   Improving overall, consider further adjustments tomorrow if necessary 14. Irritation left eyGTX:MIWOEHOZith  ciloxan gtts, tx x 1 wk, d/ced 2/11   Currently patched 15. Hypophosphatemia   Potassium 3.6 on 2/11   Supplement decreased to daily on 2/9  16. Hypernatremia: Resolved   Sodium 144 on 2/11 17. Acute blood loss anemia: Resolved   Hemoglobin 14.5 on 2/11 18. Leukocytosis  WBCs 10.6 on 2/11   Afebrile   Cont to monitor   LOS (Days) 10 A FACE TO FACE EVALUATION WAS PERFORMED  Arthur [name] AnLorie Phenix/04/2018, 10:47 AM

## 2017-07-25 NOTE — Progress Notes (Signed)
Speech Language Pathology Daily Session Note  Patient Details  Name: Arthur [name] MRN: [id number] Date of Birth: 1929-06-01  Today's Date: 07/25/2017 SLP Individual Time: 0730-0830 SLP Individual Time Calculation (min): 60 min  Short Term Goals: Week 2: SLP Short Term Goal 1 (Week 2): Pt will demonstrate complex problem solving during functional tasks with supervision verbal cues.  SLP Short Term Goal 2 (Week 2): Pt will utilize speech intelligibility strategies to achieve ~ 75% intelligibility at the sentence level with Min A verbal cues.  SLP Short Term Goal 3 (Week 2): Pt will consume current diet with minimal overt s/s of aspiraton with supervision verbal cues for use of swallowing compensatory strategies.  SLP Short Term Goal 4 (Week 2): Patient will attend to left field of enviornment during functional tasks with Min A verbal cues.   Skilled Therapeutic Interventions: Skilled treatment session focused on dysphagia and cognitive goals. Patient independently recalled his swallowing compensatory strategies but required Min-Mod A verbal cues for volitional cough after initial swallow and to self-monitor and clear left pocketing. Patient consumed meal without overt s/s of aspiration. Recommend patient continue current diet. SLP also facilitated session by providing Min A verbal cues for attention to left field of environment during tray set-up and self-feeding and required Mod verbal cues to self-monitor and correct proper positioning for safe PO intake while upright in bed. Patient left upright in bed with alarm on and all needs within reach. Continue with current plan of care.        Function:  Eating Eating   Modified Consistency Diet: Yes Eating Assist Level: Supervision or verbal cues;More than reasonable amount of time;Set up assist for;Helper checks for pocketed food   Eating Set Up Assist For: Opening containers;Cutting food;Applying device (includes dentures)        Cognition Comprehension Comprehension assist level: Follows basic conversation/direction with extra time/assistive device  Expression   Expression assist level: Expresses basic 90% of the time/requires cueing < 10% of the time.  Social Interaction Social Interaction assist level: Interacts appropriately 90% of the time - Needs monitoring or encouragement for participation or interaction.  Problem Solving Problem solving assist level: Solves basic 50 - 74% of the time/requires cueing 25 - 49% of the time  Memory Memory assist level: Recognizes or recalls 75 - 89% of the time/requires cueing 10 - 24% of the time    Pain No/Denies Pain   Therapy/Group: Individual Therapy  [name] 07/25/2017, 8:50 AM

## 2017-07-25 NOTE — Progress Notes (Signed)
Physical Therapy Weekly Progress Note  Patient Details  Name: Arthur [name] MRN: [id number] Date of Birth: [birth year]  Beginning of progress report period: July 16, 2017 End of progress report period: July 25, 2017  Today's Date: 07/25/2017 PT Individual Time: 0910-1020 PT Individual Time Calculation (min): 70 min   Patient has met 0 of 5 short term goals.  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  Pt continues to require +2 for all functional mobility with PT sessions focused primarily on midline orientation, decreased pushing, and attention.  Patient continues to demonstrate the following deficits muscle weakness and muscle paralysis, impaired timing and sequencing, abnormal tone, unbalanced muscle activation and decreased coordination, decreased attention, decreased problem solving, decreased memory and delayed processing and decreased sitting balance, decreased standing balance, decreased postural control, hemiplegia and decreased balance strategies and therefore will continue to benefit from skilled PT intervention to increase functional independence with mobility. Patient progressing toward long term goals, slowly.  Continue plan of care. Will re-evaluate need to downgrade goals over this reporting period.   PT Short Term Goals Week 1:  PT Short Term Goal 1 (Week 1): Pt will increase bed mobility to max A.  PT Short Term Goal 1 - Progress (Week 1): Not met PT Short Term Goal 2 (Week 1): Pt will increase transfers bed to chair to max A.  PT Short Term Goal 2 - Progress (Week 1): Not met PT Short Term Goal 3 (Week 1): Pt will ambulate with appropriate assistive device about 5 feet with +2 Assist PT Short Term Goal 3 - Progress (Week 1): Not met PT Short Term Goal 4 (Week 1): Pt will ascend/descend 1 stair with 1 rail and max A.  PT Short Term Goal 4 - Progress (Week 1): Not met PT Short Term Goal 5 (Week 1): Pt will propel w/c about 25  feet with min A.  PT Short Term Goal 5 - Progress (Week 1): Not met Week 2:  PT Short Term Goal 1 (Week 2): Pt will maintain static sitting balance with min assist x10 minutes PT Short Term Goal 2 (Week 2): Pt will stand with +1 assist PT Short Term Goal 3 (Week 2): pt will maintain dynamic sitting balance with mod assist  Skilled Therapeutic Interventions/Progress Updates:    no c/o pain.  Session focus on NMR for postural control and LLE.    Pt requires max assist for lower body dressing at bed level via bridging and rolling.  Supine>sit with +2 assist.  Attempted squat/pivot to the R with +1 assist but even with RUE support eliminated pt unable to overcome strong push with RLE so ultimately required +2 assist for transfer.  Hoyer transfer from w/c>tilt table.  Pt able to tolerate ~15 minutes in standing with tilt table, no reports of dizziness.  Focus on midline orientation with mirror feedback, and LLE hip/knee extension with max multimodal cues.  By end of tilt table activity pt noted to have trace activation of L quads and L hip flexors.  NMES to L quads x10 minutes at 35 pps, 300 pulse width.  Pt tolerated well.  Returned to room at end of session and positioned upright in w/c with lap tray in place, call bell in reach and needs met.   Therapy Documentation Precautions:  Precautions Precautions: Fall Precaution Comments: L inattention Restrictions Weight Bearing Restrictions: No   See Function Navigator for Current Functional Status.  Therapy/Group: Individual Therapy  Michel Santee 07/25/2017, 10:13 AM

## 2017-07-25 NOTE — Discharge Instructions (Signed)
Inpatient Rehab Discharge Instructions  Arthur [name] Discharge date and time:    Activities/Precautions/ Functional Status: Activity: no lifting, driving, or strenuous exercise  till cleared by MD Diet: cardiac diet Wound Care: none needed    Functional status:  ___ No restrictions     ___ Walk up steps independently _X--24/7 supervision/assistance   ___ Walk up steps with assistance ___ Intermittent supervision/assistance  ___ Bathe/dress independently ___ Walk with walker     _X__ Bathe/dress with assistance ___ Walk Independently    ___ Shower independently ___ Walk with assistance    ___ Shower with assistance _X__ No alcohol     ___ Return to work/school ________  Special Instructions:  STROKE/TIA DISCHARGE INSTRUCTIONS SMOKING Cigarette smoking nearly doubles your risk of having a stroke & is the single most alterable risk factor  If you smoke or have smoked in the last 12 months, you are advised to quit smoking for your health.  Most of the excess cardiovascular risk related to smoking disappears within a year of stopping.  Ask you doctor about anti-smoking medications  [facility] Quit Line: 1-800-QUIT NOW  Free Smoking Cessation Classes [phone]  CHOLESTEROL Know your levels; limit fat & cholesterol in your diet  Lipid Panel     Component Value Date/Time   CHOL 122 07/12/2017 0359   TRIG 92 07/12/2017 0359   TRIG 93 07/12/2017 0359   HDL 38 (L) 07/12/2017 0359   CHOLHDL 3.2 07/12/2017 0359   VLDL 18 07/12/2017 0359   LDLCALC 66 07/12/2017 0359      Many patients benefit from treatment even if their cholesterol is at goal.  Goal: Total Cholesterol (CHOL) less than 160  Goal:  Triglycerides (TRIG) less than 150  Goal:  HDL greater than 40  Goal:  LDL (LDLCALC) less than 100   BLOOD PRESSURE American Stroke Association blood pressure target is less that 120/80 mm/Hg  Your discharge blood pressure is:  BP: 126/80  Monitor your blood pressure  Limit your  salt and alcohol intake  Many individuals will require more than one medication for high blood pressure  DIABETES (A1c is a blood sugar average for last 3 months) Goal HGBA1c is under 7% (HBGA1c is blood sugar average for last 3 months)  Diabetes:     Lab Results  Component Value Date   HGBA1C 5.6 07/13/2017     Your HGBA1c can be lowered with medications, healthy diet, and exercise.  Check your blood sugar as directed by your physician  Call your physician if you experience unexplained or low blood sugars.  PHYSICAL ACTIVITY/REHABILITATION Goal is 30 minutes at least 4 days per week  Activity: No driving, Therapies: see above Return to work: N/A  Activity decreases your risk of heart attack and stroke and makes your heart stronger.  It helps control your weight and blood pressure; helps you relax and can improve your mood.  Participate in a regular exercise program.  Talk with your doctor about the best form of exercise for you (dancing, walking, swimming, cycling).  DIET/WEIGHT Goal is to maintain a healthy weight  Your discharge diet is: DIET - DYS 1 Room service appropriate? Yes; Fluid consistency: Thin  liquids Your height is:  Height: 5\' 7"  (170.2 cm) Your current weight is: Weight: 79.6 kg (175 lb 7.8 oz) Your Body Mass Index (BMI) is:  BMI (Calculated): 27.48  Following the type of diet specifically designed for you will help prevent another stroke.  Your goal weight  is:  159 lbs  Your goal Body Mass Index (BMI) is 19-24.  Healthy food habits can help reduce 3 risk factors for stroke:  High cholesterol, hypertension, and excess weight.  RESOURCES Stroke/Support Group:  Call [phone]   STROKE EDUCATION PROVIDED/REVIEWED AND GIVEN TO PATIENT Stroke warning signs and symptoms How to activate emergency medical system (call 911). Medications prescribed at discharge. Need for follow-up after discharge. Personal risk factors for stroke. Pneumonia vaccine given:    Flu vaccine given:  My questions have been answered, the writing is legible, and I understand these instructions.  I will adhere to these goals & educational materials that have been provided to me after my discharge from the hospital.    My questions have been answered and I understand these instructions. I will adhere to these goals and the provided educational materials after my discharge from the hospital.  Patient/Caregiver Signature _______________________________ Date __________  Clinician Signature _______________________________________ Date __________  Please bring this form and your medication list with you to all your follow-up doctor's appointments.   Information on my medicine - ELIQUIS (apixaban)  This medication education was reviewed with me or my healthcare representative as part of my discharge preparation.  The pharmacist that spoke with me during my hospital stay was:  Tonya  Why was Eliquis prescribed for you? Eliquis was prescribed for you to reduce the risk of forming blood clots that can cause a stroke if you have a medical condition called atrial fibrillation (a type of irregular heartbeat) OR to reduce the risk of a blood clots forming after orthopedic surgery.  What do You need to know about Eliquis ? Take your Eliquis TWICE DAILY - one tablet in the morning and one tablet in the evening with or without food.  It would be best to take the doses about the same time each day.  If you have difficulty swallowing the tablet whole please discuss with your pharmacist how to take the medication safely.  Take Eliquis exactly as prescribed by your doctor and DO NOT stop taking Eliquis without talking to the doctor who prescribed the medication.  Stopping may increase your risk of developing a new clot or stroke.  Refill your prescription before you run out.  After discharge, you should have regular check-up appointments with your healthcare provider that is  prescribing your Eliquis.  In the future your dose may need to be changed if your kidney function or weight changes by a significant amount or as you get older.  What do you do if you miss a dose? If you miss a dose, take it as soon as you remember on the same day and resume taking twice daily.  Do not take more than one dose of ELIQUIS at the same time.  Important Safety Information A possible side effect of Eliquis is bleeding. You should call your healthcare provider right away if you experience any of the following: ? Bleeding from an injury or your nose that does not stop. ? Unusual colored urine (red or dark brown) or unusual colored stools (red or black). ? Unusual bruising for unknown reasons. ? A serious fall or if you hit your head (even if there is no bleeding).  Some medicines may interact with Eliquis and might increase your risk of bleeding or clotting while on Eliquis. To help avoid this, consult your healthcare provider or pharmacist prior to using any new prescription or non-prescription medications, including herbals, vitamins, non-steroidal anti-inflammatory drugs (NSAIDs) and supplements.  This website has  more information on Eliquis (apixaban): [email or web address].

## 2017-07-26 ENCOUNTER — Encounter (HOSPITAL_COMMUNITY): Payer: Medicare Other | Admitting: Psychology

## 2017-07-26 ENCOUNTER — Inpatient Hospital Stay (HOSPITAL_COMMUNITY): Payer: Medicare Other | Admitting: Occupational Therapy

## 2017-07-26 ENCOUNTER — Inpatient Hospital Stay (HOSPITAL_COMMUNITY): Payer: Medicare Other | Admitting: Speech Pathology

## 2017-07-26 ENCOUNTER — Ambulatory Visit (HOSPITAL_COMMUNITY): Payer: Medicare Other | Admitting: Physical Therapy

## 2017-07-26 MED ORDER — PANTOPRAZOLE SODIUM 40 MG PO TBEC
40.0000 mg | DELAYED_RELEASE_TABLET | Freq: Every day | ORAL | Status: DC
  Administered 2017-07-26 – 2017-08-03 (×9): 40 mg via ORAL
  Filled 2017-07-26 (×9): qty 1

## 2017-07-26 NOTE — Progress Notes (Signed)
Occupational Therapy Session Note  Patient Details  Name: Arthur [name] MRN: [id number] Date of Birth: 09/07/1928  Today's Date: 07/26/2017 OT Individual Time: 3837-7939 OT Individual Time Calculation (min): 72 min   Short Term Goals: Week 2:  OT Short Term Goal 1 (Week 2): Pt will be able to roll in bed with mod A to assist caregivers with clothing changes. OT Short Term Goal 2 (Week 2): Pt will be able to sit to EOB with consistent Mod A to reduce caregiver burden. OT Short Term Goal 3 (Week 2): Pt will be able to wash UB with min A. OT Short Term Goal 4 (Week 2): Pt will scan to the L to locate 2/4 grooming items with min questioning cues.  Skilled Therapeutic Interventions/Progress Updates:    OT treatment session focused on decreasing pushing, sit<>stand, attention, initiation, and modified bathing/dressing. 2nd OT present throughout session for +2 assist. Worked on sitting balance at EOB using environmental cues to bring pt to R and decrease pushing. Worked on anterior weight shift with placing clothing into chair in front of pt. Sit<.> stand in stedy with 2 person assist and pillow placed at knees 2/2 R knee pain. +2 to transfer from stedy into roll in shower chair 2/2 severe pushing to L. Had pt reach towards the floor on R side to decrease pushing in shower chair. Pt needed max cues to maintain midline throughout shower and would begin pushing again with increased task demand of washing body parts. Had pt reach to try to wash feet to encourage anterior weight shift and decrease pushing. Sit<>stand at the sink using 3 muskateers technique, OT on R side blocked R foot from sliding out and facilitated weight shift onto L LE 3x.  3rd person exchanged shower chair for TIS wc. Worked on hemi-dressing technqiues for UB dressing sitting in wc at the sink. Pt needed max multimodal cues to orient, initiate, and sequence task. Pt left tilted in TIS with lap tray on and needs met.   Therapy  Documentation Precautions:  Precautions Precautions: Fall Precaution Comments: L inattention Restrictions Weight Bearing Restrictions: No Pain: Pain Assessment Pain Assessment: No/denies pain Pain Score: 0-No pain ADL: ADL ADL Comments: refer to functional navigator  See Function Navigator for Current Functional Status.  Therapy/Group: Individual Therapy  Valma Cava 07/26/2017, 12:02 PM

## 2017-07-26 NOTE — Consult Note (Signed)
Neuropsychological Consultation   Patient:   Arthur [name]   DOB:   Jun 27, 1928  MR Number:  [id number]  Location:  [facility] [id number] [place] [address] Dept: [facility]: [phone]           Date of Service:   07/26/2017  Start Time:   1 PM End Time:   2 PM  Provider/Observer:  Ilean Skill, Psy.D.       Clinical Neuropsychologist       Billing Code/Service: [phone] 4 Units  Chief Complaint:    Arthur [name] is an 84 year old male who was admitted on 07/11/2017 with left-sided weakness and right gaze preference.  Right M2 superior division occlusion was noted.  The patient received TPA.  MRI/MRA revealed acute moderate right MCA small right distal ACA infarcts and small right posterior watershed territory infarcts.  The patient has had residual left-sided hemiparesis but reports that the symptoms have shown some very mild improvement in functioning.  The patient is also had prior CVA and has a number of significant medical issues going on.  Reason for Service:  The patient was referred for neuropsychological/psychological consultation due to ongoing coping issues following his most recent CVA.  The patient is a number of medical issues going on and will likely need to transfer from the acute rehabilitation program to a nursing facility.  Below is the HPI for the current admission.  HPI: Arthur [name] is a 83 y.o. male with history of CKD, CAD s/p CABG, CVA, A fib s/p MAZE- chronic coumadin who was admitted on 07/11/17 with left sided weakness with right gaze preference. INR subtherapeutic at admission and CTA head neck showed calcified plaque of aorta and carotid bifurcation, right M2 superior division proximal occlusion with poor down stream collateralization and multiple areas of mild to moderate intracranial atherosclerosis. He underwent cerebral angio with complete  revascularization of R-ACA with IA tPA and IV tPA. He tolerated extubation without difficulty on 1/29. MRI/MRA brain done revealing acute moderate R-MCA, small right distal ACA infarcts, small right posterior watershed territory infarct with minimal petechial hemorrhage and old large left cerebellar infarct with successful revascularization of R-MCA occlusion.  MBS done yesterday and he was placed on Dysphagia 1, nectar liquids. He developed fever yesterday pm with leucocytosis and CXR showed low lung volumes. UA negative and he was started on Levaquin due to concerns of aspiration PNA. Stroke felt to be embolic due to subtherapeutic coumadin. Anticoagulation on hold due to size of stroke--now on ASA with recommendations to resume anticoagulation in 5-7 days. Patient with resultant left hemiparesis, left inattention with right gaze preference, dysphagia and left facial droop. Therapy ongoing and CIR recommended due to functional deficits.     Current Status:  The patient has continued left-sided hemiparesis showing very mild improvements in motor functioning.  The patient also has significant dysarthria and expressive language issues primarily related to motor control and fluent articulation.  The patient reports that his mood has been generally good and he denies any significant depressive or anxiety based symptomatology.  The patient has a strong family support system and has been visited by his out of state granddaughters recently.  However, his wife is also dealing with her own age related issues and is not likely able to provide the level of care that he will need to allowed to return home.  Behavioral Observation: LORRIS CARDUCCI  presents as a  84 y.o.-year-old Right Caucasian Male who appeared his stated age. his dress was Appropriate and he was Well Groomed and his manners were Appropriate to the situation.  his participation was indicative of Appropriate behaviors.  There were physical disabilities  noted.  he displayed an appropriate level of cooperation and motivation.     Interactions:    Active Appropriate  Attention:   abnormal and attention span appeared shorter than expected for age  Memory:   within normal limits; recent and remote memory intact  Visuo-spatial:  not examined  Speech (Volume):  low  Speech:   slurred; slurred  Thought Process:  Coherent and Relevant  Though Content:  WNL; not suicidal  Orientation:   person, place, time/date and situation  Judgment:   Fair  Planning:   Fair  Affect:    Appropriate  Mood:    Dysphoric  Insight:   Good  Intelligence:   high  Medical History:   Past Medical History:  Diagnosis Date  . Arthritis    "some in my joints" (01/24/2015)  . CKD (chronic kidney disease), stage II   . Coronary artery disease    a. s/p CABG in 2005 with MV repair and MAZE procedure. b. s/p DES to prox RCA 01/2015.   Marland Kitchen CVA (cerebrovascular accident) [facility]) ~ 2014   RIGHT BRAIN; denies residual on 01/24/2015  . Esophagitis    Distal esophagitis  . GERD (gastroesophageal reflux disease)   . Heart murmur   . History of hiatal hernia   . History of recurrent TIAs   . Hyperlipidemia   . Hypertension   . Hypertensive vascular disease   . MVP (mitral valve prolapse)   . Nephrolithiasis   . Odynophagia   . PAF (paroxysmal atrial fibrillation) ([place])   . Transudative pleural effusion        Psychiatric History:  No prior psychiatric history noted.    Family Med/Psych History:  Family History  Problem Relation Age of Onset  . Heart attack Mother   . Stroke Father   . Hypertension Father   . Kidney failure Father     Risk of Suicide/Violence: virtually non-existent patient denies any suicidal or homicidal ideation.  Impression/DX:  Arthur [name] is an 84 year old male who was admitted on 07/11/2017 with left-sided weakness and right gaze preference.  Right M2 superior division occlusion was noted.  The patient received TPA.   MRI/MRA revealed acute moderate right MCA small right distal ACA infarcts and small right posterior watershed territory infarcts.  The patient has had residual left-sided hemiparesis but reports that the symptoms have shown some very mild improvement in functioning.  The patient is also had prior CVA and has a number of significant medical issues going on.  The patient has continued left-sided hemiparesis showing very mild improvements in motor functioning.  The patient also has significant dysarthria and expressive language issues primarily related to motor control and fluent articulation.  The patient reports that his mood has been generally good and he denies any significant depressive or anxiety based symptomatology.  The patient has a strong family support system and has been visited by his out of state granddaughters recently.  However, his wife is also dealing with her own age related issues and is not likely able to provide the level of care that he will need to allowed to return home.  Today we worked on coping and adjustment issues to better manage and deal with the residual effects of his right hemispheric embolic  stroke.  The patient has a general awareness of the new limitations that are going on with him.  While this is frustrating to him the patient does appear to be adjusting and adapting to these recent changes.  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

## 2017-07-26 NOTE — Progress Notes (Signed)
Subjective/Complaints: Good appetite, no other issues  ROS: Denies CP, SOB, N/V/D  Objective:  Vital Signs: Blood pressure (!) 151/83, pulse 75, temperature (!) 97.5 F (36.4 C), temperature source Oral, resp. rate 17, height 5' 7"  (1.702 m), weight 79.6 kg (175 lb 7.8 oz), SpO2 97 %.  Results for orders placed or performed during the hospital encounter of 07/15/17 (from the past 72 hour(s))  Basic metabolic panel     Status: Abnormal   Collection Time: 07/25/17  5:11 AM  Result Value Ref Range   Sodium 144 135 - 145 mmol/L   Potassium 4.2 3.5 - 5.1 mmol/L   Chloride 108 101 - 111 mmol/L   CO2 26 22 - 32 mmol/L   Glucose, Bld 98 65 - 99 mg/dL   BUN 26 (H) 6 - 20 mg/dL   Creatinine, Ser 1.23 0.61 - 1.24 mg/dL   Calcium 9.5 8.9 - 10.3 mg/dL   GFR calc non Af Amer 51 (L) >60 mL/min   GFR calc Af Amer 59 (L) >60 mL/min    Comment: (NOTE) The eGFR has been calculated using the CKD EPI equation. This calculation has not been validated in all clinical situations. eGFR's persistently <60 mL/min signify possible Chronic Kidney Disease.    Anion gap 10 5 - 15    Comment: Performed at [facility] [address] [place], [place] [address]  CBC     Status: Abnormal   Collection Time: 07/25/17  5:11 AM  Result Value Ref Range   WBC 10.6 (H) 4.0 - 10.5 K/uL   RBC 4.82 4.22 - 5.81 MIL/uL   Hemoglobin 14.5 13.0 - 17.0 g/dL   HCT 45.0 39.0 - 52.0 %   MCV 93.4 78.0 - 100.0 fL   MCH 30.1 26.0 - 34.0 pg   MCHC 32.2 30.0 - 36.0 g/dL   RDW 13.7 11.5 - 15.5 %   Platelets 402 (H) 150 - 400 K/uL    Comment: Performed at [facility] [address], [place], [place] [address]  Phosphorus     Status: None   Collection Time: 07/25/17  5:11 AM  Result Value Ref Range   Phosphorus 3.6 2.5 - 4.6 mg/dL    Comment: Performed at [facility] [address] [place], [place] [address]  Magnesium     Status: None   Collection Time: 07/25/17  5:11 AM  Result Value Ref Range   Magnesium 2.2 1.7 - 2.4 mg/dL    Comment: Performed at [facility] [address], [place],  [address]     HEENT: left eye patch. Normocephalic. Atraumatic. Cardio: RRR and no JVD. Resp: CTA B/L and unlaboured GI: BS positive and ND Musculoskeletal:  No Edema and no tenderness Skin:   Other intact. Warm and dry. Neuro: Alert/Oriented Motor LUE: 1/5 shoulder abduction, distally 0/5 LLE: 2-/5 proximal to distal  Right low extremity 3/5 proximal to distal Increased tone left upper extremity at elbow flexors Gen NAD. Vital signs reviewed.  Assessment/Plan: 1. 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 Physiatrist is providing close team supervision and 24 hour management of active medical problems listed below. Physiatrist and rehab team continue to assess barriers to discharge/monitor patient progress toward functional and medical goals. FIM: Function - Bathing Position: Other (comment)(sitting in [place]) Body  parts bathed by patient: Chest, Abdomen Body parts bathed by helper: Left arm, Right arm, Front perineal area, Buttocks, Right lower leg, Left lower leg, Left upper leg, Right upper leg Assist Level: 2 helpers  Function- Upper Body Dressing/Undressing What is the patient wearing?: Button up shirt Pull over shirt/dress - Perfomed by patient: Thread/unthread right sleeve Pull over shirt/dress - Perfomed by helper: Thread/unthread right sleeve, Thread/unthread left sleeve, Put head through opening, Pull shirt over trunk Button up shirt - Perfomed by patient: Thread/unthread right sleeve Button up shirt - Perfomed by helper: Thread/unthread left sleeve, Pull shirt around back, Button/unbutton shirt Assist Level: 2 helpers Function - Lower Body Dressing/Undressing Lower  body dressing/undressing activity did not occur: N/A What is the patient wearing?: Pants Position: Bed Pants- Performed by helper: Thread/unthread right pants leg, Thread/unthread left pants leg, Pull pants up/down Non-skid slipper socks- Performed by helper: Don/doff left sock, Don/doff right sock Assist for footwear: Dependant Assist for lower body dressing: 2 Helpers  Function - Toileting Toileting activity did not occur: No continent bowel/bladder event Toileting steps completed by helper: Adjust clothing prior to toileting, Performs perineal hygiene, Adjust clothing after toileting Assist level: Two helpers  Function - Air cabin crew transfer activity did not occur: Safety/medical concerns Toilet transfer assistive device: Pension scheme manager, NT report) Assist level to toilet: 2 helpers(per Candice, NT report)  Function - Chair/bed transfer Chair/bed transfer method: Squat pivot Chair/bed transfer assist level: 2 helpers Chair/bed transfer assistive device: Mechanical lift Mechanical lift: Clarise Cruz Chair/bed transfer details: Manual facilitation for weight bearing, Manual facilitation for weight shifting, Manual facilitation for placement, Verbal cues for technique, Verbal cues for precautions/safety  Function - Locomotion: Wheelchair Will patient use wheelchair at discharge?: Yes Type: Manual Wheelchair activity did not occur: Safety/medical concerns Wheel 50 feet with 2 turns activity did not occur: Safety/medical concerns Wheel 150 feet activity did not occur: Safety/medical concerns Function - Locomotion: Ambulation Ambulation activity did not occur: Safety/medical concerns Walk 10 feet activity did not occur: Safety/medical concerns Walk 50 feet with 2 turns activity did not occur: Safety/medical concerns Walk 150 feet activity did not occur: Safety/medical concerns Walk 10 feet on uneven surfaces activity did not occur: Safety/medical concerns  Function  - Comprehension Comprehension: Auditory Comprehension assist level: Follows basic conversation/direction with extra time/assistive device  Function - Expression Expression: Verbal Expression assist level: Expresses basic 90% of the time/requires cueing < 10% of the time.  Function - Social Interaction Social Interaction assist level: Interacts appropriately 90% of the time - Needs monitoring or encouragement for participation or interaction.  Function - Problem Solving Problem solving assist level: Solves basic 50 - 74% of the time/requires cueing 25 - 49% of the time  Function - Memory Memory assist level: Recognizes or recalls 75 - 89% of the time/requires cueing 10 - 24% of the time Patient normally able to recall (first 3 days only): Current season, Staff names and faces, That he or she is in a hospital  Medical Problem List and Plan:  1. Functional deficits and left hemiparesis secondary to right MCA infarct, small right ACA infarct, posterior watershed infarct due to cardio-embolic source    Cont CIR Team conf in am 2. DVT Prophylaxis/Anticoagulation: Pharmaceutical: eliquis no clinical evidence of DVT   3. Pain Management: tylenol prn  4. Mood: LCSW to follow for evaluation and support  5. Neuropsych: This patient is not capable of making decisions on his own behalf.  6. Skin/Wound Care: routine pressure relief measures  7. Fluids/Electrolytes/Nutrition: monitor I/O. Offer liquids between meals to maintain adequate hydration.  8. CAD s/p Maze/ Chronic A fib: Monitor HR bid. Eliquis   9. FUO: resolved 10 GERD: Managed with Protonix  11. CKD stage II: Baseline SCr 1.2.    Creatinine 1.09 on 2/4   Encourage fluids   Continue to monitor 12. HTN: Monitor BP bid--Home medsCoreg, Lotensin, Amlodipine  Vitals:   07/25/17 1400 07/26/17 0230  BP: 126/80 (!) 151/83  Pulse: 86 75  Resp: 18 17  Temp: 97.8 F (36.6 C) (!) 97.5 F (36.4 C)  SpO2: 97% 97%  Resumed amlodipine 48m  2/5, increased to 154m2/8   Improving overall, consider further adjustments 2/13 if necessary 13. Irritation left eyKLF:QBDHURTIith  ciloxan gtts, tx x 1 wk, d/ced 2/11   Currently patched 14. Hypophosphatemia   Potassium 3.6 on 2/11   Supplement decreased to daily on 2/9  15 Leukocytosis  WBCs 10.6 on 2/11   Afebrile   Cont to monitor   LOS (Days) 11 A FACE TO FACE EVALUATION WAS PERFORMED  AnCharlett Blake/05/2018, 8:33 AM

## 2017-07-26 NOTE — Progress Notes (Signed)
Speech Language Pathology Daily Session Note  Patient Details  Name: Arthur [name] MRN: [id number] Date of Birth: 1928/11/05  Today's Date: 07/26/2017 SLP Individual Time: 0930-1030 SLP Individual Time Calculation (min): 60 min  Short Term Goals: Week 2: SLP Short Term Goal 1 (Week 2): Pt will demonstrate complex problem solving during functional tasks with supervision verbal cues.  SLP Short Term Goal 2 (Week 2): Pt will utilize speech intelligibility strategies to achieve ~ 75% intelligibility at the sentence level with Min A verbal cues.  SLP Short Term Goal 3 (Week 2): Pt will consume current diet with minimal overt s/s of aspiraton with supervision verbal cues for use of swallowing compensatory strategies.  SLP Short Term Goal 4 (Week 2): Patient will attend to left field of enviornment during functional tasks with Min A verbal cues.   Skilled Therapeutic Interventions: Skilled treatment session focused on dysphagia and cognitive goals. Upon arrival, pt was sitting upright in bed consuming breakfast meal of Dys. 1 textures with thin liquids. Pt consumed meal without overt s/s of aspiration and required Min A verbal cues to self-monitor left pocketing/anterior spillage and for use of volitional cough after each sip of liquid. SLP further facilitated session by providing Mod A verbal cues for problem solving during a mildly complex task (Blink). Pt required supervision verbal cues for attention to left field of environment throughout tasks. Pt left upright in bed with alarm on and all needs within reach. Continue with current plan of care.     Function:  Eating Eating   Modified Consistency Diet: Yes Eating Assist Level: Supervision or verbal cues;Helper checks for pocketed food;Set up assist for   Eating Set Up Assist For: Opening containers;Cutting food;Applying device (includes dentures)       Cognition Comprehension Comprehension assist level: Understands complex 90% of the  time/cues 10% of the time  Expression   Expression assist level: Expresses basic 90% of the time/requires cueing < 10% of the time.  Social Interaction Social Interaction assist level: Interacts appropriately 90% of the time - Needs monitoring or encouragement for participation or interaction.  Problem Solving Problem solving assist level: Solves basic 50 - 74% of the time/requires cueing 25 - 49% of the time  Memory Memory assist level: Recognizes or recalls 90% of the time/requires cueing < 10% of the time    Pain Pain Assessment Pain Assessment: No/denies pain   Therapy/Group: Individual Therapy  Meredeth Ide  SLP - Student 07/26/2017, 3:43 PM

## 2017-07-26 NOTE — Progress Notes (Signed)
Physical Therapy Session Note  Patient Details  Name: Arthur [name] MRN: [id number] Date of Birth: March 02, 1929  Today's Date: 07/26/2017 PT Individual Time: 2548-3234 PT Individual Time Calculation (min): 60 min   Short Term Goals: Week 2:  PT Short Term Goal 1 (Week 2): Pt will maintain static sitting balance with min assist x10 minutes PT Short Term Goal 2 (Week 2): Pt will stand with +1 assist PT Short Term Goal 3 (Week 2): pt will maintain dynamic sitting balance with mod assist  Skilled Therapeutic Interventions/Progress Updates:    no c/o pain at rest, but does report R knee pain in standing.  Session focus on forward weight shift, standing, and LLE NMR.    Utilized dynavision on mode A continuous for focus on forward weight shift from w/c and maintaining balance during dynamic reaching task.  Pt able to reach to R with min guard, but requires max/total for balance when reaching to the L.  Rest breaks provided as needed for physical/mental fatigue.   Sit<>stand x3 attempts (2 successful) facing rail.  Requires max/total to come to standing, but requires +2 assist for midline positioning and upright posture.  In standing focus on decreased LLE tone, weight shift L to reduce R knee pain, and activation of glutes/quads for upright posture.  Pt requires max multimodal cues.    Attempted kinetron with LLE bias for mass extension, however pt unable to activate LLE 2/2 fatigue vs weakness.  Pt returned to room at end of session and positioned in tilt in space chair with call bell in reach and needs met.   Therapy Documentation Precautions:  Precautions Precautions: Fall Precaution Comments: L inattention Restrictions Weight Bearing Restrictions: No   See Function Navigator for Current Functional Status.   Therapy/Group: Individual Therapy  Michel Santee 07/26/2017, 4:40 PM

## 2017-07-27 ENCOUNTER — Inpatient Hospital Stay (HOSPITAL_COMMUNITY): Payer: Medicare Other | Admitting: Speech Pathology

## 2017-07-27 ENCOUNTER — Inpatient Hospital Stay (HOSPITAL_COMMUNITY): Payer: Medicare Other | Admitting: Occupational Therapy

## 2017-07-27 ENCOUNTER — Inpatient Hospital Stay (HOSPITAL_COMMUNITY): Payer: Medicare Other | Admitting: Physical Therapy

## 2017-07-27 MED ORDER — BENAZEPRIL HCL 5 MG PO TABS
5.0000 mg | ORAL_TABLET | Freq: Every day | ORAL | Status: DC
  Administered 2017-07-27 – 2017-08-03 (×8): 5 mg via ORAL
  Filled 2017-07-27 (×8): qty 1

## 2017-07-27 NOTE — Progress Notes (Signed)
Physical Therapy Session Note  Patient Details  Name: Arthur [name] MRN: [id number] Date of Birth: August 12, 1928  Today's Date: 07/27/2017 PT Individual Time: 0945-1100 PT Individual Time Calculation (min): 75 min   Short Term Goals: Week 2:  PT Short Term Goal 1 (Week 2): Pt will maintain static sitting balance with min assist x10 minutes PT Short Term Goal 2 (Week 2): Pt will stand with +1 assist PT Short Term Goal 3 (Week 2): pt will maintain dynamic sitting balance with mod assist  Skilled Therapeutic Interventions/Progress Updates:    no c/o pain at rest.  Session focus on NMR for bed mobility, transfers, sitting/standing balance.    Dressing from bed level focus on rolling and LLE flexion/extension activation.  Supine>sit with HOB elevated and max assist.  Pt able to maintain static sitting balance with close supervision and verbal cues for decreasing pushing. Upper body dressing EOB with assist for thread UEs and min assist to maintain balance.    +2 for squat/pivot to w/c on R and mat on R.  Sit>supine with mod assist and NMR focus on initiation of rolling from hip/shoulder.  Pt completed blocked practicing rolling R and L with mod assist and max multimodal cues.  Sit>supine with total assist.  Sit<>stand x3 with Clarise Cruz + focus on L weight shift and LLE activation in standing for pre-gait with RLE.    Returned to room in tilt in space at end of session and positioned with QRB in place, call bell in reach and needs met.   Therapy Documentation Precautions:  Precautions Precautions: Fall Precaution Comments: L inattention Restrictions Weight Bearing Restrictions: No   See Function Navigator for Current Functional Status.   Therapy/Group: Individual Therapy  Michel Santee 07/27/2017, 11:10 AM

## 2017-07-27 NOTE — Plan of Care (Signed)
  Progressing Consults RH STROKE PATIENT EDUCATION Description See Patient Education module for education specifics  07/27/2017 1025 - Progressing by Glean Salen, RN [facility] STROKE PATIENT EDUCATION Description See Patient Education module for education specifics  07/27/2017 1025 - Progressing by Glean Salen, RN RH BOWEL ELIMINATION RH STG MANAGE BOWEL WITH ASSISTANCE Description STG Manage Bowel with Mod Assistance.  07/27/2017 1025 - Progressing by Glean Salen, RN Flowsheets Taken 07/27/2017 1025  STG: Pt will manage bowels with assistance 1-Total assistance RH STG MANAGE BOWEL W/MEDICATION W/ASSISTANCE Description STG Manage Bowel with Medication with mod Assistance.  07/27/2017 1025 - Progressing by Glean Salen, RN Flowsheets Taken 07/27/2017 1025  STG: Pt will manage bowels with medication with assistance 1-Total assistance RH BLADDER ELIMINATION RH STG MANAGE BLADDER WITH ASSISTANCE Description STG Manage Bladder With mod Assistance  07/27/2017 1025 - Progressing by Glean Salen, RN Flowsheets Taken 07/27/2017 1025  STG: Pt will manage bladder with assistance 1-Total assistance RH SKIN INTEGRITY RH STG SKIN FREE OF INFECTION/BREAKDOWN Description No new breakdown with mod  assist    07/27/2017 1025 - Progressing by Glean Salen, RN RH SAFETY RH STG ADHERE TO SAFETY PRECAUTIONS W/ASSISTANCE/DEVICE Description STG Adhere to Safety Precautions With Min Assistance/Device.  07/27/2017 1025 - Progressing by Glean Salen, RN

## 2017-07-27 NOTE — Progress Notes (Signed)
Occupational Therapy Session Note  Patient Details  Name: Arthur [name] MRN: [id number] Date of Birth: December 06, 1928  Today's Date: 07/27/2017 OT Individual Time: 1301-1400 OT Individual Time Calculation (min): 59 min   Short Term Goals: Week 2:  OT Short Term Goal 1 (Week 2): Pt will be able to roll in bed with mod A to assist caregivers with clothing changes. OT Short Term Goal 2 (Week 2): Pt will be able to sit to EOB with consistent Mod A to reduce caregiver burden. OT Short Term Goal 3 (Week 2): Pt will be able to wash UB with min A. OT Short Term Goal 4 (Week 2): Pt will scan to the L to locate 2/4 grooming items with min questioning cues.  Skilled Therapeutic Interventions/Progress Updates:    OT treatment session focused on transfer training and decreased pushing. Pt came to sitting EOB with max A. Achieved sitting balance with cue to reach toward footboard at end of bed. SB transfer to the R side with Total A +2 and facilitation for head/hips relationship. Attempted SB transfer to the L with pt unable to weight shift off of L 2/2 pushing. SB transfer to R with max A +2. Had pt reach to get cones from floor on R side, then place cones outside base of support on R table to achieve trunk extension and decrease pushing. Pt returned to room at end of session and left tilted in wc with needs met and handoff to SLP.  Therapy Documentation Precautions:  Precautions Precautions: Fall Precaution Comments: L inattention Restrictions Weight Bearing Restrictions: No  See Function Navigator for Current Functional Status.   Therapy/Group: Individual Therapy  Valma Cava 07/27/2017, 2:03 PM

## 2017-07-27 NOTE — Progress Notes (Signed)
Subjective/Complaints:  Patient without new complaints today.  Denies any pains.  ROS: Denies CP, SOB, N/V/D  Objective:  Vital Signs: Blood pressure (!) 155/77, pulse 68, temperature 98 F (36.7 C), temperature source Oral, resp. rate 18, height 5' 7"  (1.702 m), weight 79.6 kg (175 lb 7.8 oz), SpO2 95 %.  Results for orders placed or performed during the hospital encounter of 07/15/17 (from the past 72 hour(s))  Basic metabolic panel     Status: Abnormal   Collection Time: 07/25/17  5:11 AM  Result Value Ref Range   Sodium 144 135 - 145 mmol/L   Potassium 4.2 3.5 - 5.1 mmol/L   Chloride 108 101 - 111 mmol/L   CO2 26 22 - 32 mmol/L   Glucose, Bld 98 65 - 99 mg/dL   BUN 26 (H) 6 - 20 mg/dL   Creatinine, Ser 1.23 0.61 - 1.24 mg/dL   Calcium 9.5 8.9 - 10.3 mg/dL   GFR calc non Af Amer 51 (L) >60 mL/min   GFR calc Af Amer 59 (L) >60 mL/min    Comment: (NOTE) The eGFR has been calculated using the CKD EPI equation. This calculation has not been validated in all clinical situations. eGFR's persistently <60 mL/min signify possible Chronic Kidney Disease.    Anion gap 10 5 - 15    Comment: Performed at [facility] [address] [place], [place] [address]  CBC     Status: Abnormal   Collection Time: 07/25/17  5:11 AM  Result Value Ref Range   WBC 10.6 (H) 4.0 - 10.5 K/uL   RBC 4.82 4.22 - 5.81 MIL/uL   Hemoglobin 14.5 13.0 - 17.0 g/dL   HCT 45.0 39.0 - 52.0 %   MCV 93.4 78.0 - 100.0 fL   MCH 30.1 26.0 - 34.0 pg   MCHC 32.2 30.0 - 36.0 g/dL   RDW 13.7 11.5 - 15.5 %   Platelets 402 (H) 150 - 400 K/uL    Comment: Performed at [facility] [address] [place], [place] [address]  Phosphorus     Status: None   Collection Time: 07/25/17  5:11 AM  Result Value Ref Range   Phosphorus 3.6 2.5 - 4.6 mg/dL    Comment: Performed at [facility] [address] [place], [place] [address]  Magnesium     Status: None   Collection Time: 07/25/17  5:11 AM  Result  Value Ref Range   Magnesium 2.2 1.7 - 2.4 mg/dL    Comment: Performed at [facility] [address], [place], [place] [address]     HEENT: left eye patch. Normocephalic. Atraumatic. Cardio: RRR and no JVD. Resp: CTA B/L and unlaboured GI: BS positive and ND Musculoskeletal:  No Edema and no tenderness Skin:   Other intact. Warm and dry. Neuro: Alert/Oriented Motor LUE: 1/5 shoulder abduction, distally 0/5 LLE: 2-/5 proximal to distal  Right low extremity 3/5 proximal to distal Increased tone left upper extremity at elbow flexors Gen NAD. Vital signs reviewed.  Assessment/Plan: 1. 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 Physiatrist is providing close team supervision and 24 hour management of active medical problems listed below. Physiatrist and rehab team continue to assess barriers to discharge/monitor patient progress toward functional and medical goals. FIM: Function - Bathing Position:  Shower Body parts bathed by patient: Chest, Abdomen, Right upper leg Body parts bathed by helper: Right arm, Left arm, Front perineal area, Buttocks, Back, Left lower leg, Right lower leg, Left upper leg Assist Level: 2 helpers  Function- Upper Body Dressing/Undressing What is the patient wearing?: Pull over shirt/dress Pull over shirt/dress - Perfomed by patient: Thread/unthread right sleeve Pull over shirt/dress - Perfomed by helper: Thread/unthread left sleeve, Put head through opening, Pull shirt over trunk Button up shirt - Perfomed by patient: Thread/unthread right sleeve Button up shirt - Perfomed by helper: Thread/unthread left sleeve, Pull shirt around back, Button/unbutton shirt Assist Level: Touching or steadying assistance(Pt > 75%) Function - Lower Body  Dressing/Undressing Lower body dressing/undressing activity did not occur: N/A What is the patient wearing?: Pants, Shoes, Socks Position: Wheelchair/chair at sink Pants- Performed by helper: Thread/unthread left pants leg, Pull pants up/down, Thread/unthread right pants leg Non-skid slipper socks- Performed by helper: Don/doff left sock, Don/doff right sock Socks - Performed by helper: Don/doff right sock, Don/doff left sock Shoes - Performed by helper: Don/doff left shoe, Don/doff right shoe Assist for footwear: Dependant Assist for lower body dressing: 2 Helpers  Function - Toileting Toileting activity did not occur: No continent bowel/bladder event Toileting steps completed by helper: Adjust clothing prior to toileting, Performs perineal hygiene, Adjust clothing after toileting Assist level: Two helpers  Function - Air cabin crew transfer activity did not occur: Safety/medical concerns Toilet transfer assistive device: Pension scheme manager, NT report) Assist level to toilet: 2 helpers(per Candice, NT report)  Function - Chair/bed transfer Chair/bed transfer method: Squat pivot Chair/bed transfer assist level: 2 helpers Chair/bed transfer assistive device: Mechanical lift Mechanical lift: Clarise Cruz Chair/bed transfer details: Manual facilitation for weight bearing, Manual facilitation for weight shifting, Manual facilitation for placement, Verbal cues for technique, Verbal cues for precautions/safety  Function - Locomotion: Wheelchair Will patient use wheelchair at discharge?: Yes Type: Manual Wheelchair activity did not occur: Safety/medical concerns Wheel 50 feet with 2 turns activity did not occur: Safety/medical concerns Wheel 150 feet activity did not occur: Safety/medical concerns Function - Locomotion: Ambulation Ambulation activity did not occur: Safety/medical concerns Walk 10 feet activity did not occur: Safety/medical concerns Walk 50 feet with 2 turns  activity did not occur: Safety/medical concerns Walk 150 feet activity did not occur: Safety/medical concerns Walk 10 feet on uneven surfaces activity did not occur: Safety/medical concerns  Function - Comprehension Comprehension: Auditory Comprehension assist level: Understands complex 90% of the time/cues 10% of the time  Function - Expression Expression: Verbal Expression assist level: Expresses basic 90% of the time/requires cueing < 10% of the time.  Function - Social Interaction Social Interaction assist level: Interacts appropriately 90% of the time - Needs monitoring or encouragement for participation or interaction.  Function - Problem Solving Problem solving assist level: Solves basic 50 - 74% of the time/requires cueing 25 - 49% of the time  Function - Memory Memory assist level: Recognizes or recalls 90% of the time/requires cueing < 10% of the time Patient normally able to recall (first 3 days only): Current season, Staff names and faces, That he or she is in a hospital  Medical Problem List and Plan:  1. Functional deficits and left hemiparesis secondary to right MCA infarct, small right ACA infarct, posterior watershed infarct due to cardio-embolic source    Cont CIR Team conference today please see physician documentation under team conference tab, met with team face-to-face to discuss problems,progress, and goals. Formulized individual treatment plan based  on medical history, underlying problem and comorbidities. 2. DVT Prophylaxis/Anticoagulation: Pharmaceutical: eliquis no clinical evidence of DVT   3. Pain Management: tylenol prn  4. Mood: LCSW to follow for evaluation and support  5. Neuropsych: This patient is not capable of making decisions on his own behalf.  6. Skin/Wound Care: routine pressure relief measures  7. Fluids/Electrolytes/Nutrition: monitor I/O. Offer liquids between meals to maintain adequate hydration.  Intake 480 mL on 07/26/2017 8. CAD s/p Maze/  Chronic A fib: Monitor HR bid. Eliquis   9. FUO: resolved 10 GERD: Managed with Protonix  11. CKD stage II: Baseline SCr 1.2.    Creatinine 1.09 on 2/4   Encourage fluids   Continue to monitor 12. HTN: Monitor BP bid--Home medsCoreg, Lotensin, Amlodipine  Vitals:   07/26/17 1445 07/27/17 0545  BP: (!) 155/78 (!) 155/77  Pulse: 77 68  Resp: 18 18  Temp: 97.8 F (36.6 C) 98 F (36.7 C)  SpO2: 97% 95%  Resumed amlodipine 78m 2/5, increased to 18m2/8, still elevated add Lotensin   Improving overall, consider further adjustments 2/13 if necessary 13. Irritation left eyPJS:UNHRVACQith  ciloxan gtts, tx x 1 wk, d/ced 2/11   Currently patched 14. Hypophosphatemia   Potassium 3.6 on 2/11   Supplement decreased to daily on 2/9  15 Leukocytosis  WBCs 10.6 on 2/11   Afebrile   Cont to monitor   LOS (Days) 12 A FACE TO FACE EVALUATION WAS PERFORMED  AnCharlett Blake/13/2019, 9:33 AM

## 2017-07-27 NOTE — Progress Notes (Signed)
Social Work Patient ID: Arthur [name], male   DOB: 1929-05-23, 84 y.o.   MRN: [id number]  Met with pt and wife to discuss team conference progression and target discharge date 2/27. Wife was wondering if he could stay here longer since rehab is what he needs. Discussed would nee dot have a medical issue and probably not. Will need to confirm discharge plan and how realistic it is for them to discuss home versus going to a NH for a short time then hopefully home. Wife is here daily and encourages him in therapies. Pt is being seen by neuro-psych today for coping due to fully aware of his deficits, which makes it difficult.

## 2017-07-27 NOTE — Progress Notes (Signed)
Speech Language Pathology Daily Session Note  Patient Details  Name: Arthur [name] MRN: [id number] Date of Birth: 1928/11/09  Today's Date: 07/27/2017 SLP Individual Time: 1400-1500 SLP Individual Time Calculation (min): 60 min  Short Term Goals: Week 2: SLP Short Term Goal 1 (Week 2): Pt will demonstrate complex problem solving during functional tasks with supervision verbal cues.  SLP Short Term Goal 2 (Week 2): Pt will utilize speech intelligibility strategies to achieve ~ 75% intelligibility at the sentence level with Min A verbal cues.  SLP Short Term Goal 3 (Week 2): Pt will consume current diet with minimal overt s/s of aspiraton with supervision verbal cues for use of swallowing compensatory strategies.  SLP Short Term Goal 4 (Week 2): Patient will attend to left field of enviornment during functional tasks with Min A verbal cues.   Skilled Therapeutic Interventions: Skilled treatment session focused on cognitive goals. SLP facilitated session by providing Mod A verbal cues for recall of procedures to a previously learned task (yesterday's session). Pt required Mod A verbal cues for problem solving during the task and Min A verbal cues for attention to left field of environment throughout task. SLP further facilitated session by providing Min A verbal cues for a mildly complex money management task. Pt required increased cueing today, suspect fatigue. Pt left upright in wheelchair with all needs within reach and wife present. Continue with current plan of care.   Function:  Cognition Comprehension Comprehension assist level: Understands basic 90% of the time/cues < 10% of the time  Expression   Expression assist level: Expresses basic 75 - 89% of the time/requires cueing 10 - 24% of the time. Needs helper to occlude trach/needs to repeat words.  Social Interaction Social Interaction assist level: Interacts appropriately 90% of the time - Needs monitoring or encouragement for  participation or interaction.  Problem Solving Problem solving assist level: Solves basic 50 - 74% of the time/requires cueing 25 - 49% of the time  Memory Memory assist level: Recognizes or recalls 50 - 74% of the time/requires cueing 25 - 49% of the time    Pain Pain Assessment Pain Assessment: 0-10 Pain Score: 4  Pain Type: Chronic pain Pain Location: Back Pain Orientation: Lower Pain Intervention(s): Repositioned; Rest   Therapy/Group: Individual Therapy  Meredeth Ide  SLP - Student 07/27/2017, 3:23 PM

## 2017-07-27 NOTE — Patient Care Conference (Signed)
Inpatient RehabilitationTeam Conference and Plan of Care Update Date: 07/27/2017   Time: 11:30 AM    Patient Name: Arthur [name]      Medical Record Number: [id number]  Date of Birth: 03/28/1929 Sex: Male         Room/Bed: 4W22C/4W22C-01 Payor Info: Payor: [place] / Plan: BCBS MEDICARE / Product Type: *No Product type* /    Admitting Diagnosis: CVA  Admit Date/Time:  07/15/2017  5:58 PM Admission Comments: No comment available   Primary Diagnosis:  <principal problem not specified> Principal Problem: <principal problem not specified>  Patient Active Problem List   Diagnosis Date Noted  . Leukocytosis   . Acute blood loss anemia   . Hypernatremia   . Hypophosphatemia   . Irritation of left eye   . Stage 3 chronic kidney disease ([place])   . Acute ischemic right ACA stroke ([place]) 07/15/2017  . Left spastic hemiparesis ([place]) 07/13/2017  . Ischemic stroke ([place]) 07/11/2017  . Acute arterial ischemic stroke, multifocal, anterior circulation ([place]) 07/11/2017  . Acute respiratory failure ([place])   . Abnormal nuclear stress test 01/24/2015  . Encounter for therapeutic drug monitoring 07/19/2013  . Long term current use of anticoagulant 05/28/2011  . Dizziness 05/14/2011  . Hyperlipidemia   . Hypertension   . Coronary artery disease   . MVP (mitral valve prolapse)   . Paroxysmal atrial fibrillation (H[facility])   . Chronic renal insufficiency   . 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 [facility])     Expected Discharge Date: Expected Discharge Date: 08/10/17  Team Members Present: Physician leading conference: Dr. Alysia Penna Social Worker Present: Ovidio Kin, LCSW Nurse Present: Blair Heys, RN PT Present: Dwyane Dee, PT OT Present: Cherylynn Ridges, OT SLP Present: Weston Anna, SLP PPS Coordinator present : Daiva Nakayama, RN, CRRN     Current Status/Progress Goal Weekly Team Focus  Medical   Patient still has very little left upper  extremity or left lower extremity strength.  Blood pressure is still elevated on max dose amlodipine  Blood pressure goal less than 140/90  And second blood pressure agent   Bowel/Bladder   Intermittent incontinence  of B&B HS.  To achieve continence of B&B.  Timed toileting HS.   Swallow/Nutrition/ Hydration   Dys, 1 textures with thin liquids, Min A   Supervision  use of swallowing compensatory strategies, trials of upgraded textures    ADL's   Max/total +2  Mod A overall  Modified bathing/dressing, sitting balance, L NMR, L attention, decreased pushing, NMES, transfer training   Mobility   total overall  min for bed mobility and transfers, mod for ambulation and stairs, supervision for w/c  NMR, postural control, midline orientation   Communication   Min A   Supervision  use of speech intelligibility strategies   Safety/Cognition/ Behavioral Observations  Min A  Supervision  mildly complex problem solving, attention to left, recall    Pain   R knee pain frm 0 to 6 during the day. Tylenol and Voltaren effective.  <3.  Assess q shift and PRN for pain and treat.   Skin   Skin tear left elbow.  Maintain skin integrity.  Keep clean and dry.      *See Care Plan and progress notes for long and short-term goals.     Barriers to Discharge  Current Status/Progress Possible Resolutions Date Resolved   Physician    Medical stability;Lack of/limited family support;Inaccessible home environment     Progressing  slowly towards goals  Continue rehab, will likely need skilled nursing due to elderly caregiver      Nursing                  PT                    OT                  SLP                SW                Discharge Planning/Teaching Needs:  Wife realizes can not provide the care he will require-looking into hiring caregivers and aware of NH option      Team Discussion:  Making slow progress in therapies-still plus 2 total assist. More continent in the daytime than at  night. Pusher syndrome in therapies which makes it more difficult to transfer him. Upgraded diet to Dys 1 thin trials of Dys 2-pocketing still R-knee pain voltaren gel using. Poor po intake. Neuro-psych seeing for coping. Wife will need to hire 24 hr assist or place in NH  Revisions to Treatment Plan:  DC 2/27 versus NHP    Continued Need for Acute Rehabilitation Level of Care: The patient requires daily medical management by a physician with specialized training in physical medicine and rehabilitation for the following conditions: Daily direction of a multidisciplinary physical rehabilitation program to ensure safe treatment while eliciting the highest outcome that is of practical value to the patient.: Yes Daily medical management of patient stability for increased activity during participation in an intensive rehabilitation regime.: Yes Daily analysis of laboratory values and/or radiology reports with any subsequent need for medication adjustment of medical intervention for : Neurological problems;Other  [name], [name] 07/27/2017, 1:15 PM

## 2017-07-28 ENCOUNTER — Ambulatory Visit (HOSPITAL_COMMUNITY): Payer: Medicare Other | Admitting: Physical Therapy

## 2017-07-28 ENCOUNTER — Inpatient Hospital Stay (HOSPITAL_COMMUNITY): Payer: Medicare Other | Admitting: Physical Therapy

## 2017-07-28 ENCOUNTER — Inpatient Hospital Stay (HOSPITAL_COMMUNITY): Payer: Medicare Other | Admitting: Speech Pathology

## 2017-07-28 ENCOUNTER — Inpatient Hospital Stay (HOSPITAL_COMMUNITY): Payer: Medicare Other | Admitting: Occupational Therapy

## 2017-07-28 NOTE — Progress Notes (Signed)
Speech Language Pathology Weekly Progress and Session Note  Patient Details  Name: Arthur [name] MRN: [id number] Date of Birth: 1929-04-15  Beginning of progress report period: July 21, 2017 End of progress report period: July 28, 2017  Today's Date: 07/28/2017 SLP Individual Time: 0730-0830 SLP Individual Time Calculation (min): 60 min  Short Term Goals: Week 2: SLP Short Term Goal 1 (Week 2): Pt will demonstrate complex problem solving during functional tasks with supervision verbal cues.  SLP Short Term Goal 1 - Progress (Week 2): Not met SLP Short Term Goal 2 (Week 2): Pt will utilize speech intelligibility strategies to achieve ~ 75% intelligibility at the sentence level with Min A verbal cues.  SLP Short Term Goal 2 - Progress (Week 2): Met SLP Short Term Goal 3 (Week 2): Pt will consume current diet with minimal overt s/s of aspiraton with supervision verbal cues for use of swallowing compensatory strategies.  SLP Short Term Goal 3 - Progress (Week 2): Not met SLP Short Term Goal 4 (Week 2): Patient will attend to left field of enviornment during functional tasks with Min A verbal cues.  SLP Short Term Goal 4 - Progress (Week 2): Met    New Short Term Goals: Week 3: SLP Short Term Goal 1 (Week 3): Pt will demonstrate complex problem solving during functional tasks with supervision verbal cues.  SLP Short Term Goal 2 (Week 3): Pt will utilize speech intelligibility strategies to achieve ~ 75% intelligibility at the sentence level with supervision A verbal cues.  SLP Short Term Goal 3 (Week 3): Pt will consume current diet with minimal overt s/s of aspiraton with supervision verbal cues for use of swallowing compensatory strategies.  SLP Short Term Goal 4 (Week 3): Patient will attend to left field of enviornment during functional tasks with Supervision verbal cues.   Weekly Progress Updates: Patient has made excellent gains and has met 2 of 4 STG's this reporting period.  Currently, patient is consuming Dys. 1 textures with thin liquids with minimal overt s/s of aspiration and Min A verbal cues for use of swallowing compensatory strategies.  Patient demonstrates increased speech intelligibility and is overall 75-90% intelligible at the phrase level with Min A verbal cues and requires Min-Mod A verbal cues for complex problem solving and for attention to left field of enviornment. Patient and family education is ongoing. Patient would benefit from continued skilled SLP intervention to maximize his cognitive and swallowing function as well as his speech intelligibility in order to maximize his overall functional independence prior to discharge.         Intensity: Minumum of 1-2 x/day, 30 to 90 minutes Frequency: Total of 15 hours over 7 days of combined therapies Duration/Length of Stay: 08/10/17 Treatment/Interventions: Cognitive remediation/compensation;Cueing hierarchy;Dysphagia/aspiration precaution training;Functional tasks;Patient/family education;Therapeutic Activities;Speech/Language facilitation;Environmental controls;Internal/external aids   Daily Session  Skilled Therapeutic Interventions:  Skilled treatment session focused on dysphagia and cognitive goals. SLP facilitated session by providing Min A verbal cues for recall and utilization of swallowing compensatory strategies during breakfast meal of Dys. 1 textures with thin liquids. Patient consumed meal without overt s/s of aspiration and declined trials of Dys. 2 textures. SLP also facilitated session by providing Min A verbal cues for attention to left field of environment and problem solving during tray set-up. Patient also required supervision verbal cues for speech intelligibility at the sentence level to achieve ~90% intelligibility. Patient left upright in bed with NT present. Continue with current plan of care.   Function:  Eating Eating   Modified Consistency Diet: Yes Eating Assist Level:  Supervision or verbal cues;Helper checks for pocketed food;Set up assist for           Cognition Comprehension Comprehension assist level: Understands basic 90% of the time/cues < 10% of the time  Expression   Expression assist level: Expresses basic 75 - 89% of the time/requires cueing 10 - 24% of the time. Needs helper to occlude trach/needs to repeat words.  Social Interaction Social Interaction assist level: Interacts appropriately 90% of the time - Needs monitoring or encouragement for participation or interaction.  Problem Solving Problem solving assist level: Solves basic 75 - 89% of the time/requires cueing 10 - 24% of the time  Memory Memory assist level: Recognizes or recalls 75 - 89% of the time/requires cueing 10 - 24% of the time   Pain Pain Assessment Pain Assessment: No/denies pain  Therapy/Group: Individual Therapy  Arthur [name] 07/28/2017, 12:45 PM

## 2017-07-28 NOTE — Progress Notes (Signed)
Subjective/Complaints:  Discussed care team conf with pt, also discussed need to increase fluid and meal intake  ROS: Denies CP, SOB, N/V/D  Objective:  Vital Signs: Blood pressure 130/72, pulse 72, temperature 98.9 F (37.2 C), temperature source Oral, resp. rate 16, height 5\' 7"  (1.702 m), weight 79.6 kg (175 lb 7.8 oz), SpO2 98 %.  No results found for this or any previous visit (from the past 72 hour(s)).   HEENT: left eye patch. Normocephalic. Atraumatic. Cardio: RRR and no JVD. Resp: CTA B/L and unlaboured GI: BS positive and ND Musculoskeletal:  No Edema and no tenderness Skin:   Other intact. Warm and dry. Neuro: Alert/Oriented Motor LUE: 1/5 shoulder abduction, distally 0/5 LLE: 2-/5 proximal to distal  Right low extremity 3/5 proximal to distal Increased tone left upper extremity at elbow flexors Gen NAD. Vital signs reviewed.  Assessment/Plan: 1. 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 Physiatrist is providing close team supervision and 24 hour management of active medical problems listed below. Physiatrist and rehab team continue to assess barriers to discharge/monitor patient progress toward functional and medical goals. FIM: Function - Bathing Position: Shower Body parts bathed by patient: Chest, Abdomen, Right upper leg Body parts bathed by helper: Right arm, Left arm, Front perineal area, Buttocks, Back, Left lower leg, Right lower leg, Left upper leg Assist Level: 2 helpers  Function- Upper Body Dressing/Undressing What is the patient wearing?: Button up shirt Pull over shirt/dress - Perfomed by patient: Thread/unthread right sleeve Pull over shirt/dress - Perfomed by helper: Thread/unthread left sleeve, Put head through opening, Pull shirt over trunk Button up  shirt - Perfomed by patient: Thread/unthread right sleeve Button up shirt - Perfomed by helper: Thread/unthread left sleeve, Pull shirt around back, Button/unbutton shirt Assist Level: Touching or steadying assistance(Pt > 75%) Function - Lower Body Dressing/Undressing Lower body dressing/undressing activity did not occur: N/A What is the patient wearing?: Pants, Shoes, Socks Position: Bed Pants- Performed by helper: Thread/unthread left pants leg, Pull pants up/down, Thread/unthread right pants leg Non-skid slipper socks- Performed by helper: Don/doff left sock, Don/doff right sock Socks - Performed by helper: Don/doff right sock, Don/doff left sock Shoes - Performed by helper: Don/doff left shoe, Don/doff right shoe Assist for footwear: Dependant Assist for lower body dressing: 2 Helpers  Function - Toileting Toileting activity did not occur: No continent bowel/bladder event Toileting steps completed by helper: Adjust clothing prior to toileting, Performs perineal hygiene, Adjust clothing after toileting Assist level: Two helpers  Function - Air cabin crew transfer activity did not occur: Safety/medical concerns Toilet transfer assistive device: Pension scheme manager, NT report) Assist level to toilet: 2 helpers(per Candice, NT report)  Function - Chair/bed transfer Chair/bed transfer method: Squat pivot Chair/bed transfer assist level: 2 helpers Chair/bed transfer assistive device: Mechanical lift Mechanical lift: Sara Chair/bed transfer details: Manual facilitation for weight bearing, Manual facilitation for weight shifting, Manual facilitation for placement, Verbal cues for technique, Verbal cues for precautions/safety  Function - Locomotion: Wheelchair Will patient use wheelchair at discharge?: Yes Type: Manual Wheelchair activity did not occur: Safety/medical concerns Wheel 50 feet with 2 turns activity did not occur: Safety/medical concerns Wheel 150 feet  activity did not occur: Safety/medical concerns Function - Locomotion: Ambulation Ambulation activity did not occur: Safety/medical concerns Walk 10 feet activity did not occur: Safety/medical concerns Walk  50 feet with 2 turns activity did not occur: Safety/medical concerns Walk 150 feet activity did not occur: Safety/medical concerns Walk 10 feet on uneven surfaces activity did not occur: Safety/medical concerns  Function - Comprehension Comprehension: Auditory Comprehension assist level: Understands basic 90% of the time/cues < 10% of the time  Function - Expression Expression: Verbal Expression assist level: Expresses basic 75 - 89% of the time/requires cueing 10 - 24% of the time. Needs helper to occlude trach/needs to repeat words.  Function - Social Interaction Social Interaction assist level: Interacts appropriately 90% of the time - Needs monitoring or encouragement for participation or interaction.  Function - Problem Solving Problem solving assist level: Solves basic 50 - 74% of the time/requires cueing 25 - 49% of the time  Function - Memory Memory assist level: Recognizes or recalls 50 - 74% of the time/requires cueing 25 - 49% of the time Patient normally able to recall (first 3 days only): Current season, Staff names and faces, That he or she is in a hospital  Medical Problem List and Plan:  1. Functional deficits and left hemiparesis secondary to right MCA infarct, small right ACA infarct, posterior watershed infarct due to cardio-embolic source    Cont CIR PT, OT, SLP- discussed need for 24/7 care , wife's inability to provide required care2. DVT Prophylaxis/Anticoagulation: Pharmaceutical: eliquis no clinical evidence of DVT   3. Pain Management: tylenol prn  4. Mood: LCSW to follow for evaluation and support  5. Neuropsych: This patient is not capable of making decisions on his own behalf.  6. Skin/Wound Care: routine pressure relief measures  7.  Fluids/Electrolytes/Nutrition: monitor I/O. Offer liquids between meals to maintain adequate hydration. 25-75% meals, Intake 480 mL on 07/27/2017 8. CAD s/p Maze/ Chronic A fib: Monitor HR bid. Eliquis   9. FUO: resolved 10 GERD: Managed with Protonix  11. CKD stage II: Baseline SCr 1.2.    Creatinine 1.09 on 2/4   Encourage fluids   Continue to monitor 12. HTN: Monitor BP bid--Home medsCoreg, Lotensin, Amlodipine  Vitals:   07/27/17 1509 07/28/17 0024  BP: 117/77 130/72  Pulse: (!) 101 72  Resp: 18 16  Temp: (!) 97.5 F (36.4 C) 98.9 F (37.2 C)  SpO2: 97% 98%  Resumed amlodipine 5mg  2/5, increased to 10mg  2/8, improved with  Lotensin   Improving overall, consider further adjustments 2/13 if necessary 13. Irritation left XTA:VWPVXYIA with  ciloxan gtts, tx x 1 wk, d/ced 2/11   Currently patched 14. Hypophosphatemia   Potassium 3.6 on 2/11   Supplement decreased to daily on 2/9  15 Leukocytosis  WBCs 10.6 on 2/11   Afebrile   Cont to monitor   LOS (Days) 13 A FACE TO FACE EVALUATION WAS PERFORMED  Charlett Blake 07/28/2017, 8:22 AM

## 2017-07-28 NOTE — Progress Notes (Signed)
Physical Therapy Session Note  Patient Details  Name: Arthur [name] MRN: [id number] Date of Birth: December 18, 1928  Today's Date: 07/28/2017 PT Individual Time: 5027-7412 and 8786-7672 PT Individual Time Calculation (min): 30 min and 51 minutes  Short Term Goals: Week 2:  PT Short Term Goal 1 (Week 2): Pt will maintain static sitting balance with min assist x10 minutes PT Short Term Goal 2 (Week 2): Pt will stand with +1 assist PT Short Term Goal 3 (Week 2): pt will maintain dynamic sitting balance with mod assist  Skilled Therapeutic Interventions/Progress Updates:  Session 1:  No c/o pain, session focus on NMR for LLE and trunk with rolling.  Pt rolled to L with min assist to maintain initiation and muscle activation until in complete side lying.  Rolling to R with increased time, mod assist and max multimodal cues to attend to positioning.  Used hoyer to transfer to w/c and pt taken to dayroom for stroke support group.   Session 2: handoff from stroke support group.  Session focus on positioning in w/c for improved sitting tolerance.  Ultimately transitioned to different tilt in space due to leg rests not being able to be adjusted correctly.  Requires +1 total assist for transfer to mat on R side, pt with good push through LEs and requires assist for pivot and to prevent pushing to L.  Requires total assist for transfer to new w/c on R, but requires second person to prevent tone in LLE from sliding foot underneath mat.  W/c adjusted for improved sitting tolerance and reduced pressure.  Returned to room at end of session and positioned upright with tray in place, call bell in reach and needs met.      Therapy Documentation Precautions:  Precautions Precautions: Fall Precaution Comments: L inattention Restrictions Weight Bearing Restrictions: No   See Function Navigator for Current Functional Status.   Therapy/Group: Individual Therapy  Michel Santee 07/28/2017, 4:48 PM

## 2017-07-28 NOTE — Progress Notes (Signed)
Occupational Therapy Session Note  Patient Details  Name: Arthur [name] MRN: [id number] Date of Birth: 1928-11-19  Today's Date: 07/28/2017 OT Individual Time: 0900-1015 OT Individual Time Calculation (min): 75 min    Short Term Goals: Week 2:  OT Short Term Goal 1 (Week 2): Pt will be able to roll in bed with mod A to assist caregivers with clothing changes. OT Short Term Goal 2 (Week 2): Pt will be able to sit to EOB with consistent Mod A to reduce caregiver burden. OT Short Term Goal 3 (Week 2): Pt will be able to wash UB with min A. OT Short Term Goal 4 (Week 2): Pt will scan to the L to locate 2/4 grooming items with min questioning cues.  Skilled Therapeutic Interventions/Progress Updates:    OT treatment session focused on sitting balance, pushing L attention, and transfers. LB dressing completed bed level with total A to thread B LE's into pant legs. Pt with tight hip flexors on L side and unable to achieve figure 4 position. Pt very tearful throughout session, breaking out into tears with most task today. With OT supporting LL E worked on bridging to pull up pants with pt able to turn in glutes. Sup<>sit with total A. Had pt reach for foot board to achieve sitting balance. Assisted with threading L UE into shirt, then pt able to thread R UE but needed total A to maintain sitting balance 2/2 severe pushing. Total A +2 slideboard transfer to TIS wc. Pt brought to the sink for shaving task and washing face while working on sitting posture and L attention. Pt needed mod instructional cues to locate items on L side of sink and shave L side of face. Pt also needed cues to initiate and ternminate task. Applied 1:1 NMES to CH1 supraspinatus and middle deltoid to help approximate shoulder joint, and Ch 2 wrist extensors.  Ratio 1:1 Rate 35 pps Waveform- Asymmetric Ramp 1.0 Pulse 300 CH1 Intensity- 15  Duration -  15  CH1 Intensity- 20 Duration -  15   Therapy  Documentation Precautions:  Precautions Precautions: Fall Precaution Comments: L inattention Restrictions Weight Bearing Restrictions: No ADL: ADL ADL Comments: refer to functional navigator  See Function Navigator for Current Functional Status.   Therapy/Group: Individual Therapy  Valma Cava 07/28/2017, 9:31 AM

## 2017-07-29 ENCOUNTER — Inpatient Hospital Stay (HOSPITAL_COMMUNITY): Payer: Medicare Other | Admitting: Occupational Therapy

## 2017-07-29 ENCOUNTER — Inpatient Hospital Stay (HOSPITAL_COMMUNITY): Payer: Medicare Other

## 2017-07-29 ENCOUNTER — Inpatient Hospital Stay (HOSPITAL_COMMUNITY): Payer: Medicare Other | Admitting: Speech Pathology

## 2017-07-29 NOTE — NC FL2 (Signed)
[place] LEVEL OF CARE SCREENING TOOL     IDENTIFICATION  Patient Name: Arthur [name] Birthdate: February 02, 1929 Sex: male Admission Date (Current Location): 07/15/2017  [facility] and [place] Number:  Herbalist and Address:  [facility] [place], [place] [address] [place], [place] [address]      Provider Number: [id number]  Attending Physician Name and Address:  Charlett Blake, MD  Relative Name and Phone Number:  Nancy-wife [phone]    Current Level of Care: Other (Comment)(rehab) Recommended Level of Care: [place] Prior Approval Number:    Date Approved/Denied:   PASRR Number: [id number]  Discharge Plan: SNF    Current Diagnoses: Patient Active Problem List   Diagnosis Date Noted  . Leukocytosis   . Acute blood loss anemia   . Hypernatremia   . Hypophosphatemia   . Irritation of left eye   . Stage 3 chronic kidney disease ([place])   . Acute ischemic right ACA stroke ([place]) 07/15/2017  . Left spastic hemiparesis ([place]) 07/13/2017  . Ischemic stroke ([place]) 07/11/2017  . Acute arterial ischemic stroke, multifocal, anterior circulation ([place]) 07/11/2017  . Acute respiratory failure ([place])   . Abnormal nuclear stress test 01/24/2015  . Encounter for therapeutic drug monitoring 07/19/2013  . Long term current use of anticoagulant 05/28/2011  . Dizziness 05/14/2011  . Hyperlipidemia   . Hypertension   . Coronary artery disease   . MVP (mitral valve prolapse)   . Paroxysmal atrial fibrillation ([facility])   . Chronic renal insufficiency   . 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 ([facility])     Orientation RESPIRATION BLADDER Height & Weight     Self, Time, Situation, Place  Normal Incontinent Weight: 175 lb 7.8 oz (79.6 kg) Height:  5\' 7"  (170.2 cm)  BEHAVIORAL SYMPTOMS/MOOD NEUROLOGICAL BOWEL NUTRITION STATUS      Continent Diet(Dys 1 thin liquids trials DYs 2 with Speech)  AMBULATORY STATUS COMMUNICATION  OF NEEDS Skin   Total Care Verbally Normal                       Personal Care Assistance Level of Assistance  Bathing, Feeding, Dressing Bathing Assistance: Limited assistance Feeding assistance: Limited assistance Dressing Assistance: Limited assistance     Functional Limitations Info  Sight Sight Info: Adequate Hearing Info: Adequate Speech Info: Adequate    SPECIAL CARE FACTORS FREQUENCY  PT (By licensed PT), OT (By licensed OT), Bowel and bladder program, Speech therapy     PT Frequency: 5x week OT Frequency: 5x week Bowel and Bladder Program Frequency: Timed tolieting for continence   Speech Therapy Frequency: 5x week      Contractures Contractures Info: Not present    Additional Factors Info  Code Status, Allergies Code Status Info: Full Code Allergies Info: Tap, Lasix Furosemide, Pennicillins, Sulfa Drugs cross reactors           Current Medications (07/29/2017):  This is the current hospital active medication list Current Facility-Administered Medications  Medication Dose Route Frequency Provider Last Rate Last Dose  . acetaminophen (TYLENOL) tablet 325-650 mg  325-650 mg Oral Q4H PRN Bary Leriche, PA-C   650 mg at 07/27/17 1511  . alum & mag hydroxide-simeth (MAALOX/MYLANTA) 200-200-20 MG/5ML suspension 30 mL  30 mL Oral Q4H PRN Love, Pamela S, PA-C      . amLODipine (NORVASC) tablet 10 mg  10 mg Oral Daily Kirsteins, Luanna Salk, MD   10 mg at  07/29/17 0820  . apixaban (ELIQUIS) tablet 5 mg  5 mg Oral BID Bary Leriche, PA-C   5 mg at 07/29/17 0820  . artificial tears (LACRILUBE) ophthalmic ointment   Left Eye QHS Love, Pamela S, PA-C      . benazepril (LOTENSIN) tablet 5 mg  5 mg Oral Daily Kirsteins, Luanna Salk, MD   5 mg at 07/29/17 0820  . bisacodyl (DULCOLAX) suppository 10 mg  10 mg Rectal Daily PRN Bary Leriche, PA-C   10 mg at 07/19/17 1606  . diclofenac sodium (VOLTAREN) 1 % transdermal gel 2 g  2 g Topical QID Jamse Arn, MD   2 g at  07/29/17 1219  . diphenhydrAMINE (BENADRYL) 12.5 MG/5ML elixir 12.5-25 mg  12.5-25 mg Oral Q6H PRN Love, Pamela S, PA-C      . guaiFENesin-dextromethorphan (ROBITUSSIN DM) 100-10 MG/5ML syrup 5-10 mL  5-10 mL Oral Q6H PRN Love, Pamela S, PA-C      . metoprolol tartrate (LOPRESSOR) injection 5 mg  5 mg Intravenous Q5 min PRN Love, Pamela S, PA-C      . pantoprazole (PROTONIX) EC tablet 40 mg  40 mg Oral Daily Charlett Blake, MD   40 mg at 07/29/17 0820  . phosphorus (K PHOS NEUTRAL) tablet 250 mg  250 mg Oral Daily Jamse Arn, MD   250 mg at 07/29/17 0820  . polyvinyl alcohol (LIQUIFILM TEARS) 1.4 % ophthalmic solution 1-2 drop  1-2 drop Left Eye TID WC & HS Love, Ivan Anchors, PA-C   2 drop at 07/29/17 1218  . prochlorperazine (COMPAZINE) tablet 5-10 mg  5-10 mg Oral Q6H PRN Love, Pamela S, PA-C       Or  . prochlorperazine (COMPAZINE) injection 5-10 mg  5-10 mg Intramuscular Q6H PRN Love, Pamela S, PA-C       Or  . prochlorperazine (COMPAZINE) suppository 12.5 mg  12.5 mg Rectal Q6H PRN Love, Pamela S, PA-C      . rosuvastatin (CRESTOR) tablet 20 mg  20 mg Oral q1800 Charlett Blake, MD   20 mg at 07/28/17 2021  . senna-docusate (Senokot-S) tablet 1 tablet  1 tablet Oral QHS PRN Bary Leriche, PA-C   1 tablet at 07/16/17 2023  . sodium phosphate (FLEET) 7-19 GM/118ML enema 1 enema  1 enema Rectal Once PRN Love, Pamela S, PA-C      . traZODone (DESYREL) tablet 25-50 mg  25-50 mg Oral QHS PRN Bary Leriche, PA-C   50 mg at 07/20/17 2114     Discharge Medications: Please see discharge summary for a list of discharge medications.  Relevant Imaging Results:  Relevant Lab Results:   Additional Information SSN: [id number]  Milla Wahlberg, [name], LCSW

## 2017-07-29 NOTE — Progress Notes (Signed)
Occupational Therapy Session Note  Patient Details  Name: Arthur [name] MRN: [id number] Date of Birth: February 02, 1929  Today's Date: 07/29/2017 OT Individual Time: 4818-5631 OT Individual Time Calculation (min): 28 min   Short Term Goals: Week 2:  OT Short Term Goal 1 (Week 2): Pt will be able to roll in bed with mod A to assist caregivers with clothing changes. OT Short Term Goal 2 (Week 2): Pt will be able to sit to EOB with consistent Mod A to reduce caregiver burden. OT Short Term Goal 3 (Week 2): Pt will be able to wash UB with min A. OT Short Term Goal 4 (Week 2): Pt will scan to the L to locate 2/4 grooming items with min questioning cues.  Skilled Therapeutic Interventions/Progress Updates:    Pt brought in front of mirror and practiced weight shifting. Partial sit<>stands with +2 assist with OT facilitating weight bearing through LLE to decrease flexor tone. Progressed to pulling up on parallel bars using underhand grip off to there R to decrease pushing while facilitating weight bearing through LLE. Pt returned to room at end of session with total A +2 SB transfer back to bed. Pt left semi-reclined in bed with needs met and family present.   Therapy Documentation Precautions:  Precautions Precautions: Fall Precaution Comments: L inattention Restrictions Weight Bearing Restrictions: No ADL: ADL ADL Comments: refer to functional navigator    See Function Navigator for Current Functional Status.  Therapy/Group: Individual Therapy  Valma Cava 07/29/2017, 3:34 PM

## 2017-07-29 NOTE — Progress Notes (Signed)
Occupational Therapy Session Note  Patient Details  Name: Arthur [name] MRN: [id number] Date of Birth: 1929/04/19  Today's Date: 07/29/2017  Session 1 OT Individual Time: 1660-6301 OT Individual Time Calculation (min): 30 min   Session 2 OT Individual Time: 1100-1200 OT Individual Time Calculation (min): 60 min    Short Term Goals: Week 2:  OT Short Term Goal 1 (Week 2): Pt will be able to roll in bed with mod A to assist caregivers with clothing changes. OT Short Term Goal 2 (Week 2): Pt will be able to sit to EOB with consistent Mod A to reduce caregiver burden. OT Short Term Goal 3 (Week 2): Pt will be able to wash UB with min A. OT Short Term Goal 4 (Week 2): Pt will scan to the L to locate 2/4 grooming items with min questioning cues.  Skilled Therapeutic Interventions/Progress Updates:    Session 1 OT treatment session focused on L UE NMR and self-feeding. Provided hand over hand A to bring L UE to table as a stabilizer for apple sauce bowl. Min Cues to check for pocketing and to swallow 2x. Pt brought into gravity eliminated side lying position for L NMR with joint input to bring pt through full ROM. Pt with increased flexor synergy patterns, and increased tone. Provided gentle massage and ROM to achieve elbow extension, wrist extension.   Session 2 Pt incontinent of bladder upon OT arrival. Worked on bed mobility and rolling L and R. Pt needed max cues 2/2 apraxia to reach and roll. Pt Total A for LB dressing, worked on hip bridging in bed with good glute activation bilaterally. Transferred to sitting EOB and worked on UB dressing while maintaining sitting balance at EOB. SB transfers L and R bed>wc>therapy mat>wc with total A +2.  1:1 NMES applied to supraspinatus and middle deltoid to help approximate shoulder joint to reduce sublux.   Ratio 1:3 Rate 35 pps Waveform- Asymmetric Ramp 1.0 Pulse 300 Intensity- 28 Duration -  10  Report of pain at the beginning of session  0 Report of pain at the end of session 0  No adverse reactions after treatment and is skin intact.   While NMES applied, worked on R trunk extension and righting reactions with reaching for cones on R side. Incorporated NDT techniques to facilitate weight shift. Applied kinesiotape to L shoulder sublux and pt returned to room with needs met.   Therapy Documentation Precautions:  Precautions Precautions: Fall Precaution Comments: L inattention Restrictions Weight Bearing Restrictions: No Pain:  none/denies pain ADL: ADL ADL Comments: refer to functional navigator  See Function Navigator for Current Functional Status.   Therapy/Group: Individual Therapy  Valma Cava 07/29/2017, 1:04 PM

## 2017-07-29 NOTE — Progress Notes (Signed)
Speech Language Pathology Daily Session Note  Patient Details  Name: Arthur [name] MRN: [id number] Date of Birth: 1929/04/15  Today's Date: 07/29/2017 SLP Individual Time: 0830-0930 SLP Individual Time Calculation (min): 60 min  Short Term Goals: Week 3: SLP Short Term Goal 1 (Week 3): Pt will demonstrate complex problem solving during functional tasks with supervision verbal cues.  SLP Short Term Goal 2 (Week 3): Pt will utilize speech intelligibility strategies to achieve ~ 75% intelligibility at the sentence level with supervision A verbal cues.  SLP Short Term Goal 3 (Week 3): Pt will consume current diet with minimal overt s/s of aspiraton with supervision verbal cues for use of swallowing compensatory strategies.  SLP Short Term Goal 4 (Week 3): Patient will attend to left field of enviornment during functional tasks with Supervision verbal cues.   Skilled Therapeutic Interventions: Skilled treatment session focused on dysphagia and cognitive goals. SLP facilitated session by providing Mod A verbal cues for problem solving with a moderately complex money management task. Pt demonstrated attention to left field of environment with Supervision verbal cues throughout task. SLP further facilitated session by administering trials of dys. 2 textures. Pt consumed dys. 2 textures without overt s/s of aspiration and minimal oral residue/pocketing. Pt required Min A verbal cues for recall and utilization of swallowing compensatory strategies. Recommend trial tray of dys. 2 textures prior to upgrade. Pt left upright in bed with RN. Continue with current plan of care.    Function:  Eating Eating     Eating Assist Level: Set up assist for;Supervision or verbal cues;Helper checks for pocketed food           Cognition Comprehension Comprehension assist level: Understands basic 90% of the time/cues < 10% of the time  Expression   Expression assist level: Expresses basic 75 - 89% of the  time/requires cueing 10 - 24% of the time. Needs helper to occlude trach/needs to repeat words.  Social Interaction Social Interaction assist level: Interacts appropriately 90% of the time - Needs monitoring or encouragement for participation or interaction.  Problem Solving Problem solving assist level: Solves basic 75 - 89% of the time/requires cueing 10 - 24% of the time  Memory Memory assist level: Recognizes or recalls 75 - 89% of the time/requires cueing 10 - 24% of the time    Pain    Therapy/Group: Individual Therapy  Meredeth Ide  SLP - Student 07/29/2017, 3:32 PM

## 2017-07-29 NOTE — Progress Notes (Signed)
Physical Therapy Session Note  Patient Details  Name: Arthur [name] MRN: [id number] Date of Birth: [birth year]  Today's Date: 07/29/2017 PT Individual Time: 8938-1017 PT Individual Time Calculation (min): 40 min   Short Term Goals: Week 2:  PT Short Term Goal 1 (Week 2): Pt will maintain static sitting balance with min assist x10 minutes PT Short Term Goal 2 (Week 2): Pt will stand with +1 assist PT Short Term Goal 3 (Week 2): pt will maintain dynamic sitting balance with mod assist  Skilled Therapeutic Interventions/Progress Updates:    Session focused on NMR to address postural control retraining, reorientation to midline, functional sitting balance, transfers with slideboard, and sit <> stand/partial squats. Pt with strong pushing L tendencies and requires up to max assist to reorient but also at times able to do with min assist for tactile feedback. Transfer to the bed to complete toileting on bedpan requires total +2 to the L and poor postural control with max multimodal cues for technique and weightshift. Pt able to come to EOB with max assist utilizing multimodal cues for rolling technique and then pushing up to sitting EOB. Pt able to maintain functional sitting balance with supervision to min assist x 3 min including slight corrections when losing balance to the L. slideboard transfer back into the w/c to the R with max assist of 1 (+2 needed for safety and assist with positioning of the slideboard), halfway through, pt performing small squat pivots with clearance of bottom into the w/c demonstrating good anterior weightshift and postural control. Repositioned in w/c with max assist to scoot back. Engaged in sit <> stand x 3 reps with significant flexor tone noted in LLE when attempting to get into standing with +2 assist and poor ability to get trunk into extension. Modified to focus on sit to squat with focus on maintaining head to midline (cues to bring head towards R knee) and complete 2  reps with +2 assist where able to maintain midline orientation and clear bottom completely with max assist on L (second person on right but not providing lifting assist).   Therapy Documentation Precautions:  Precautions Precautions: Fall Precaution Comments: L inattention Restrictions Weight Bearing Restrictions: No General: PT Amount of Missed Time (min): 5 Minutes PT Missed Treatment Reason: Nursing care due to use of bedpan/hygiene   Pain:  Reports pain in R knee is better. No intervention needed.    See Function Navigator for Current Functional Status.   Therapy/Group: Individual Therapy  Canary Brim Ivory Broad, PT, DPT  07/29/2017, 3:47 PM

## 2017-07-29 NOTE — Progress Notes (Signed)
Subjective/Complaints:  No new issues overnight.  Anticipating PT OT.  Starting to move left elbow  ROS: Denies CP, SOB, N/V/D  Objective:  Vital Signs: Blood pressure 116/70, pulse 83, temperature 97.8 F (36.6 C), temperature source Oral, resp. rate 18, height 5\' 7"  (1.702 m), weight 79.6 kg (175 lb 7.8 oz), SpO2 100 %.  No results found for this or any previous visit (from the past 72 hour(s)).   HEENT: left eye patch. Normocephalic. Atraumatic. Cardio: RRR and no JVD. Resp: CTA B/L and unlaboured GI: BS positive and ND Musculoskeletal:  No Edema and no tenderness Skin:   Other intact. Warm and dry. Neuro: Alert/Oriented Motor LUE: 1/5 shoulder abduction, 2- left elbow flexor, distally 0/5 LLE: 2-/5 proximal to distal  Right low extremity 3/5 proximal to distal Increased tone left upper extremity at elbow flexors Gen NAD. Vital signs reviewed.  Assessment/Plan: 1. 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 Physiatrist is providing close team supervision and 24 hour management of active medical problems listed below. Physiatrist and rehab team continue to assess barriers to discharge/monitor patient progress toward functional and medical goals. FIM: Function - Bathing Position: Shower Body parts bathed by patient: Chest, Abdomen, Right upper leg Body parts bathed by helper: Right arm, Left arm, Front perineal area, Buttocks, Back, Left lower leg, Right lower leg, Left upper leg Assist Level: 2 helpers  Function- Upper Body Dressing/Undressing What is the patient wearing?: Button up shirt Pull over shirt/dress - Perfomed by patient: Thread/unthread right sleeve Pull over shirt/dress - Perfomed by helper: Thread/unthread left sleeve, Put head through opening, Pull shirt over  trunk Button up shirt - Perfomed by patient: Thread/unthread right sleeve Button up shirt - Perfomed by helper: Thread/unthread left sleeve, Pull shirt around back, Button/unbutton shirt Assist Level: Touching or steadying assistance(Pt > 75%) Function - Lower Body Dressing/Undressing Lower body dressing/undressing activity did not occur: N/A What is the patient wearing?: Pants, Shoes, Socks Position: Bed Pants- Performed by helper: Thread/unthread left pants leg, Pull pants up/down, Thread/unthread right pants leg Non-skid slipper socks- Performed by helper: Don/doff left sock, Don/doff right sock Socks - Performed by helper: Don/doff right sock, Don/doff left sock Shoes - Performed by helper: Don/doff left shoe, Don/doff right shoe Assist for footwear: Dependant Assist for lower body dressing: 2 Helpers  Function - Toileting Toileting activity did not occur: No continent bowel/bladder event Toileting steps completed by helper: Adjust clothing prior to toileting, Performs perineal hygiene, Adjust clothing after toileting Assist level: Two helpers  Function - Air cabin crew transfer activity did not occur: Safety/medical concerns Toilet transfer assistive device: Pension scheme manager, NT report) Assist level to toilet: 2 helpers(per Candice, NT report)  Function - Chair/bed transfer Chair/bed transfer method: Squat pivot Chair/bed transfer assist level: 2 helpers Chair/bed transfer assistive device: Mechanical lift Mechanical lift: Sara Chair/bed transfer details: Manual facilitation for weight bearing, Manual facilitation for weight shifting, Manual facilitation for placement, Verbal cues for technique, Verbal cues for precautions/safety  Function - Locomotion: Wheelchair Will patient use wheelchair at discharge?: Yes Type: Manual Wheelchair activity did not occur: Safety/medical concerns Wheel 50 feet with 2 turns activity did not occur: Safety/medical  concerns Wheel 150 feet activity did not occur: Safety/medical concerns Function - Locomotion: Ambulation Ambulation activity did not occur: Safety/medical concerns Walk 10 feet activity did not occur:  Safety/medical concerns Walk 50 feet with 2 turns activity did not occur: Safety/medical concerns Walk 150 feet activity did not occur: Safety/medical concerns Walk 10 feet on uneven surfaces activity did not occur: Safety/medical concerns  Function - Comprehension Comprehension: Auditory Comprehension assist level: Understands basic 90% of the time/cues < 10% of the time  Function - Expression Expression: Verbal Expression assist level: Expresses basic 75 - 89% of the time/requires cueing 10 - 24% of the time. Needs helper to occlude trach/needs to repeat words.  Function - Social Interaction Social Interaction assist level: Interacts appropriately 90% of the time - Needs monitoring or encouragement for participation or interaction.  Function - Problem Solving Problem solving assist level: Solves basic 50 - 74% of the time/requires cueing 25 - 49% of the time  Function - Memory Memory assist level: Recognizes or recalls 75 - 89% of the time/requires cueing 10 - 24% of the time Patient normally able to recall (first 3 days only): Current season, Staff names and faces, That he or she is in a hospital  Medical Problem List and Plan:  1. 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 DVT Prophylaxis/Anticoagulation: Pharmaceutical: eliquis no clinical evidence of DVT   3. Pain Management: tylenol prn  4. Mood: LCSW to follow for evaluation and support  5. Neuropsych: This patient is not capable of making decisions on his own behalf.  6. Skin/Wound Care: routine pressure relief measures  7.  Fluids/Electrolytes/Nutrition: monitor I/O. Offer liquids between meals to maintain adequate hydration.  Overall improving with meal intake still needs encouragement for fluids 8. CAD s/p Maze/ Chronic A fib: Monitor HR bid. Eliquis   9. FUO: resolved 10 GERD: Managed with Protonix  11. CKD stage II: Baseline SCr 1.2.    Creatinine 1.09 on 2/4   Encourage fluids   Continue to monitor 12. HTN: Monitor BP bid--Home medsCoreg, Lotensin, Amlodipine  Vitals:   07/29/17 0018 07/29/17 0818  BP: (!) 141/83 116/70  Pulse: 74 83  Resp: 18   Temp: 97.8 F (36.6 C)   SpO2: 100%   Resumed amlodipine 5mg  2/5, increased to 10mg  2/8, improved with  Lotensin, controlled 07/29/2017   Improving overall, consider further adjustments 2/13 if necessary 13. Irritation left FYT:WKMQKMMN with  ciloxan gtts, tx x 1 wk, d/ced 2/11   Currently patched 14. Hypophosphatemia   Potassium 3.6 on 2/11   Supplement decreased to daily on 2/9  15 Leukocytosis  WBCs 10.6 on 2/11   Afebrile   Cont to monitor   LOS (Days) 14 A FACE TO FACE EVALUATION WAS PERFORMED  Charlett Blake 07/29/2017, 2:21 PM

## 2017-07-29 NOTE — Plan of Care (Signed)
  Progressing Consults [facility] STROKE PATIENT EDUCATION Description See Patient Education module for education specifics  07/29/2017 1132 - Progressing by Glean Salen, RN [facility] STROKE PATIENT EDUCATION Description See Patient Education module for education specifics  07/29/2017 1132 - Progressing by Glean Salen, RN [facility] BOWEL ELIMINATION RH STG MANAGE BOWEL WITH ASSISTANCE Description STG Manage Bowel with Mod Assistance.  07/29/2017 1132 - Progressing by Glean Salen, RN Flowsheets Taken 07/29/2017 1132  STG: Pt will manage bowels with assistance 1-Total assistance RH STG MANAGE BOWEL W/MEDICATION W/ASSISTANCE Description STG Manage Bowel with Medication with mod Assistance.  07/29/2017 1132 - Progressing by Glean Salen, RN Flowsheets Taken 07/29/2017 1132  STG: Pt will manage bowels with medication with assistance 1-Total assistance RH BLADDER ELIMINATION RH STG MANAGE BLADDER WITH ASSISTANCE Description STG Manage Bladder With mod Assistance  07/29/2017 1132 - Progressing by Glean Salen, RN Flowsheets Taken 07/29/2017 1132  STG: Pt will manage bladder with assistance 1-Total assistance RH SKIN INTEGRITY RH STG SKIN FREE OF INFECTION/BREAKDOWN Description No new breakdown with mod  assist    07/29/2017 1132 - Progressing by Glean Salen, RN RH SAFETY RH STG ADHERE TO SAFETY PRECAUTIONS W/ASSISTANCE/DEVICE Description STG Adhere to Safety Precautions With Min Assistance/Device.  07/29/2017 1132 - Progressing by Glean Salen, RN Flowsheets Taken 07/29/2017 1132  STG:Pt will adhere to safety precautions with assistance/device 2-Maximum assistance

## 2017-07-30 ENCOUNTER — Inpatient Hospital Stay (HOSPITAL_COMMUNITY): Payer: Medicare Other | Admitting: Physical Therapy

## 2017-07-30 NOTE — Plan of Care (Signed)
  Progressing Consults [facility] STROKE PATIENT EDUCATION Description See Patient Education module for education specifics  07/30/2017 1149 - Progressing by Claris Gladden [facility] STROKE PATIENT EDUCATION Description See Patient Education module for education specifics  07/30/2017 1149 - Progressing by Claude Manges, Student-RN RH BOWEL ELIMINATION RH STG MANAGE BOWEL WITH ASSISTANCE Description STG Manage Bowel with Mod Assistance.  07/30/2017 1149 - Progressing by Claude Manges, Student-RN Flowsheets Taken 07/29/2017 1132 by Glean Salen, RN STG: Pt will manage bowels with assistance 1-Total assistance RH STG MANAGE BOWEL W/MEDICATION W/ASSISTANCE Description STG Manage Bowel with Medication with mod Assistance.  07/30/2017 1149 - Progressing by Claude Manges, Student-RN RH BLADDER ELIMINATION RH STG MANAGE BLADDER WITH ASSISTANCE Description STG Manage Bladder With mod Assistance  07/30/2017 1149 - Progressing by Claude Manges, Student-RN Flowsheets Taken 07/30/2017 1149  STG: Pt will manage bladder with assistance 1-Total assistance RH SKIN INTEGRITY RH STG SKIN FREE OF INFECTION/BREAKDOWN Description No new breakdown with mod  assist    07/30/2017 1149 - Progressing by Claude Manges, Student-RN RH SAFETY RH STG ADHERE TO SAFETY PRECAUTIONS W/ASSISTANCE/DEVICE Description STG Adhere to Safety Precautions With Min Assistance/Device.  07/30/2017 1149 - Progressing by Claude Manges, Student-RN

## 2017-07-30 NOTE — Progress Notes (Signed)
Physical Therapy Session Note  Patient Details  Name: Arthur [name] MRN: [id number] Date of Birth: 1928/08/25  Today's Date: 07/30/2017 PT Individual Time: 5573-2202 PT Individual Time Calculation (min): 73 min   Short Term Goals: Week 1:  PT Short Term Goal 1 (Week 1): Pt will increase bed mobility to max A.  PT Short Term Goal 1 - Progress (Week 1): Not met PT Short Term Goal 2 (Week 1): Pt will increase transfers bed to chair to max A.  PT Short Term Goal 2 - Progress (Week 1): Not met PT Short Term Goal 3 (Week 1): Pt will ambulate with appropriate assistive device about 5 feet with +2 Assist PT Short Term Goal 3 - Progress (Week 1): Not met PT Short Term Goal 4 (Week 1): Pt will ascend/descend 1 stair with 1 rail and max A.  PT Short Term Goal 4 - Progress (Week 1): Not met PT Short Term Goal 5 (Week 1): Pt will propel w/c about 25 feet with min A.  PT Short Term Goal 5 - Progress (Week 1): Not met  Skilled Therapeutic Interventions/Progress Updates:    Pt does not voice c/o pain upon therapy engagement.  Pt does report with standing that he has pain in his R knee from an old football injury.  Session today focused on improving seated and supine trunk mobility as well as standing with hemi-walker with total x 2.  Session initiated with donning pants in supine with +2 assist.  Pt then performed supine to sit transfer with total assist and bed to w/c transfer with +2 A utilizing a sliding board.  Pt requires significant cues t/o session (manual and verbal) for forward lean and to reach vs push during mobility activities.  Pt engaged in sitting activities involving reaching over right leg to facilitate R LE weightbearing and integration in sitting as well as to improve postural alignment for standing.  Pt then performed sit to stand x 3 reps with hemi-walker with total +2.  Following session, pt left up in chair, chair tilted posterior, nursing notified of pt location, and call bell in reach.   Vitals pre: 104/68; HR: 85 bpm.  Therapy Documentation Precautions:  Precautions Precautions: Fall Precaution Comments: L inattention Restrictions Weight Bearing Restrictions: No   See Function Navigator for Current Functional Status.   Therapy/Group: Individual Therapy  [name] Hilario Quarry 07/30/2017, 12:10 PM

## 2017-07-30 NOTE — Progress Notes (Signed)
Subjective/Complaints:  Patient sleeping when I arrived.  Awoke easily.  Denies any new problems.  Anxious to get going with therapy today  ROS: pt denies nausea, vomiting, diarrhea, cough, shortness of breath or chest pain   Objective:  Vital Signs: Blood pressure (!) 145/80, pulse 75, temperature 98 F (36.7 C), temperature source Oral, resp. rate 12, height 5\' 7"  (1.702 m), weight 79.6 kg (175 lb 7.8 oz), SpO2 99 %.  No results found for this or any previous visit (from the past 72 hour(s)).   HEENT: left eye patch. Normocephalic. Atraumatic. Cardio: RRR without murmur. No JVD  . Resp:CTA Bilaterally without wheezes or rales. Normal effort  GI: BS positive and ND Musculoskeletal:  No Edema and no tenderness Skin:   Other intact. Warm and dry. Neuro: Alert/Oriented Motor LUE: 1/5 shoulder abduction, 2- left elbow flexor, distally 0/5 LLE: 2-/5 proximal to distal  Right low extremity 3/5 proximal to distal Increased tone left upper extremity at elbow flexors--motor and sensory exam unchanged Gen NAD. Vital signs reviewed.  Assessment/Plan: 1. 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 Physiatrist is providing close team supervision and 24 hour management of active medical problems listed below. Physiatrist and rehab team continue to assess barriers to discharge/monitor patient progress toward functional and medical goals. FIM: Function - Bathing Position: Bed Body parts bathed by patient: Chest, Abdomen, Right upper leg Body parts bathed by helper: Right arm, Left arm, Front perineal area, Buttocks, Back, Left lower leg, Right lower leg, Left upper leg, Chest, Abdomen, Right upper leg Assist Level: 2 helpers  Function- Upper Body Dressing/Undressing What is the patient wearing?:  Pull over shirt/dress Pull over shirt/dress - Perfomed by patient: Thread/unthread right sleeve Pull over shirt/dress - Perfomed by helper: Thread/unthread left sleeve, Put head through opening, Pull shirt over trunk Button up shirt - Perfomed by patient: Thread/unthread right sleeve Button up shirt - Perfomed by helper: Thread/unthread left sleeve, Pull shirt around back, Button/unbutton shirt Assist Level: 2 helpers Function - Lower Body Dressing/Undressing Lower body dressing/undressing activity did not occur: N/A What is the patient wearing?: Socks Position: Bed Pants- Performed by helper: Thread/unthread left pants leg, Pull pants up/down, Thread/unthread right pants leg Non-skid slipper socks- Performed by helper: Don/doff left sock, Don/doff right sock Socks - Performed by helper: Don/doff right sock, Don/doff left sock Shoes - Performed by helper: Don/doff left shoe, Don/doff right shoe Assist for footwear: Dependant Assist for lower body dressing: 2 Helpers  Function - Toileting Toileting activity did not occur: No continent bowel/bladder event Toileting steps completed by helper: Adjust clothing prior to toileting, Performs perineal hygiene, Adjust clothing after toileting Assist level: Two helpers  Function - Air cabin crew transfer activity did not occur: Safety/medical concerns Toilet transfer assistive device: Pension scheme manager, NT report) Assist level to toilet: 2 helpers(per Candice, NT report)  Function - Chair/bed transfer Chair/bed transfer method: Lateral scoot Chair/bed transfer assist level: 2 helpers Chair/bed transfer assistive device: Sliding board, Armrests Mechanical lift: Clarise Cruz Chair/bed transfer details: Manual facilitation for weight bearing, Manual facilitation for weight shifting, Manual facilitation for placement, Verbal cues for technique, Verbal cues for precautions/safety  Function - Locomotion: Wheelchair Will patient use  wheelchair at discharge?: Yes Type: Manual Wheelchair activity did not occur: Safety/medical concerns Assist Level: Dependent (Pt equals 0%)(TIS) Wheel 50 feet with 2  turns activity did not occur: Safety/medical concerns Wheel 150 feet activity did not occur: Safety/medical concerns Function - Locomotion: Ambulation Ambulation activity did not occur: Safety/medical concerns Walk 10 feet activity did not occur: Safety/medical concerns Walk 50 feet with 2 turns activity did not occur: Safety/medical concerns Walk 150 feet activity did not occur: Safety/medical concerns Walk 10 feet on uneven surfaces activity did not occur: Safety/medical concerns  Function - Comprehension Comprehension: Auditory Comprehension assist level: Understands basic 90% of the time/cues < 10% of the time  Function - Expression Expression: Verbal Expression assist level: Expresses basic 75 - 89% of the time/requires cueing 10 - 24% of the time. Needs helper to occlude trach/needs to repeat words.  Function - Social Interaction Social Interaction assist level: Interacts appropriately 90% of the time - Needs monitoring or encouragement for participation or interaction.  Function - Problem Solving Problem solving assist level: Solves basic 75 - 89% of the time/requires cueing 10 - 24% of the time  Function - Memory Memory assist level: Recognizes or recalls 75 - 89% of the time/requires cueing 10 - 24% of the time Patient normally able to recall (first 3 days only): Current season, Staff names and faces, That he or she is in a hospital  Medical Problem List and Plan:  1. 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 DVT Prophylaxis/Anticoagulation: Pharmaceutical: eliquis no clinical evidence of DVT   3. Pain  Management: tylenol prn  4. Mood: LCSW to follow for evaluation and support  5. Neuropsych: This patient is not capable of making decisions on his own behalf.  6. Skin/Wound Care: routine pressure relief measures  7. Fluids/Electrolytes/Nutrition: monitor I/O. Offering liquids between meals to maintain adequate hydration.     -.  Eating quite well now.  Continue to push fluid intake  8. CAD s/p Maze/ Chronic A fib: Monitor HR bid. Eliquis   9. FUO: resolved 10 GERD: Managed with Protonix  11. CKD stage II: Baseline SCr 1.2.    Creatinine 1.23 on 07/25/2017   Encourage fluids   Continue to monitor 12. HTN: Monitor BP bid--Home medsCoreg, Lotensin, Amlodipine  Vitals:   07/29/17 1700 07/30/17 0417  BP: 133/76 (!) 145/80  Pulse: 76 75  Resp:  12  Temp: 97.9 F (36.6 C) 98 F (36.7 C)  SpO2: 100% 99%  Resumed amlodipine 5mg  2/5, increased to 10mg  2/8, improved with  Lotensin, controlled 07/29/2017   Improv improved control overall.  No changes today 13. Irritation left [id number] with  ciloxan gtts, tx x 1 wk, d/ced 2/11   Currently patched 14. Hypophosphatemia   Potassium 4.2 on 07/25/2017   Supplement decreased to daily on 2/9  15 Leukocytosis  WBCs 10.6 on 2/11   Afebrile   Cont to monitor   LOS (Days) 15 A FACE TO FACE EVALUATION WAS PERFORMED  SWARTZ,ZACHARY T 07/30/2017, 8:23 AM

## 2017-07-30 NOTE — Plan of Care (Signed)
  Not Progressing RH BOWEL ELIMINATION RH STG MANAGE BOWEL WITH ASSISTANCE Description STG Manage Bowel with Mod Assistance.  Total assist-incontinent 07/30/2017 2354 - Not Progressing by Cornell Barman, RN RH BLADDER ELIMINATION RH STG MANAGE BLADDER WITH ASSISTANCE Description STG Manage Bladder With mod Assistance  Incontinent at HS-total assist. 07/30/2017 2354 - Not Progressing by Cornell Barman, RN

## 2017-07-31 ENCOUNTER — Inpatient Hospital Stay (HOSPITAL_COMMUNITY): Payer: Medicare Other

## 2017-07-31 MED ORDER — BACLOFEN 5 MG HALF TABLET
5.0000 mg | ORAL_TABLET | Freq: Two times a day (BID) | ORAL | Status: DC
  Administered 2017-07-31 – 2017-08-03 (×7): 5 mg via ORAL
  Filled 2017-07-31 (×7): qty 1

## 2017-07-31 NOTE — Plan of Care (Signed)
  Progressing Consults RH STROKE PATIENT EDUCATION Description See Patient Education module for education specifics  07/31/2017 1227 - Progressing by Brita Romp, RN [facility] STROKE PATIENT EDUCATION Description See Patient Education module for education specifics  07/31/2017 1227 - Progressing by Brita Romp, RN RH BOWEL ELIMINATION RH STG MANAGE BOWEL WITH ASSISTANCE Description STG Manage Bowel with Mod Assistance.  07/31/2017 1227 - Progressing by Brita Romp, RN RH STG MANAGE BOWEL W/MEDICATION W/ASSISTANCE Description STG Manage Bowel with Medication with mod Assistance.  07/31/2017 1227 - Progressing by Brita Romp, RN RH BLADDER ELIMINATION RH STG MANAGE BLADDER WITH ASSISTANCE Description STG Manage Bladder With mod Assistance  07/31/2017 1227 - Progressing by Brita Romp, RN RH SKIN INTEGRITY RH STG SKIN FREE OF INFECTION/BREAKDOWN Description No new breakdown with mod  assist    07/31/2017 1227 - Progressing by Brita Romp, RN RH SAFETY RH STG ADHERE TO SAFETY PRECAUTIONS W/ASSISTANCE/DEVICE Description STG Adhere to Safety Precautions With Min Assistance/Device.  07/31/2017 1227 - Progressing by Brita Romp, RN  Patient mostly continent during the day.  Has some urinary urgency.

## 2017-07-31 NOTE — Progress Notes (Signed)
Occupational Therapy Note  Patient Details  Name: LESSIE MANIGO MRN: [id number] Date of Birth: 06/15/1928  Today's Date: 07/31/2017 OT Individual Time: 1345-1425 OT Individual Time Calculation (min): 40 min   Pt denies pain Individual Therapy  Pt asleep in bed upon arrival but easily aroused.  Pt requested not to be moved in bed stating NT had just positioned him on his side and he was finally comfortable.  Initial focus on LUE PROM/AAROM with emphasis on tone reduction and increased ROM.   1:1 NMES applied to facilitate wrist and finger flexion.  Pt instructed to attempt flexion when stimulation detected.  Pt tolerated well.   Ratio 1:3 Rate 35 pps Waveform- Asymmetric Ramp 1.0 Pulse 300 Intensity- 25 Duration -  10 mins   No adverse reactions after treatment and is skin intact.     Leotis Shames [facility] 07/31/2017, 2:40 PM

## 2017-07-31 NOTE — Progress Notes (Signed)
Subjective/Complaints:  Patient in bed.  Appears to be comfortable.  Nurse reports that patient is having spasms in left arm.  Patient denied that specifically.  ROS: pt denies nausea, vomiting, diarrhea, cough, shortness of breath or chest pain   Objective:  Vital Signs: Blood pressure 104/75, pulse 70, temperature 97.8 F (36.6 C), temperature source Oral, resp. rate 16, height 5\' 7"  (1.702 m), weight 79.6 kg (175 lb 7.8 oz), SpO2 100 %.  No results found for this or any previous visit (from the past 72 hour(s)).   HEENT:   Normocephalic. Atraumatic. Cardio: RRR without murmur. No JVD  . Resp:CTA Bilaterally without wheezes or rales. Normal effort  GI: BS positive and ND Musculoskeletal:  No Edema and no tenderness Skin:   Other intact. Warm and dry. Neuro: Alert/Oriented Motor LUE: 1/5 shoulder abduction, 2- left elbow flexor, distally 0/5 LLE: 2-/5 proximal to distal  Right low extremity 3/5 proximal to distal Increased tone left upper extremity at elbow flexors, mAS 1-2--motor and sensory exam unchanged Gen NAD. Vital signs reviewed.  Assessment/Plan: 1. 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 Physiatrist is providing close team supervision and 24 hour management of active medical problems listed below. Physiatrist and rehab team continue to assess barriers to discharge/monitor patient progress toward functional and medical goals. FIM: Function - Bathing Position: Bed Body parts bathed by patient: Chest, Abdomen, Right upper leg Body parts bathed by helper: Right arm, Left arm, Front perineal area, Buttocks, Back, Left lower leg, Right lower leg, Left upper leg, Chest, Abdomen, Right upper leg Assist Level: 2 helpers  Function- Upper Body Dressing/Undressing What is the  patient wearing?: Pull over shirt/dress Pull over shirt/dress - Perfomed by patient: Thread/unthread right sleeve Pull over shirt/dress - Perfomed by helper: Thread/unthread left sleeve, Put head through opening, Pull shirt over trunk Button up shirt - Perfomed by patient: Thread/unthread right sleeve Button up shirt - Perfomed by helper: Thread/unthread left sleeve, Pull shirt around back, Button/unbutton shirt Assist Level: 2 helpers Function - Lower Body Dressing/Undressing Lower body dressing/undressing activity did not occur: N/A What is the patient wearing?: Socks Position: Bed Pants- Performed by helper: Thread/unthread left pants leg, Pull pants up/down, Thread/unthread right pants leg Non-skid slipper socks- Performed by helper: Don/doff left sock, Don/doff right sock Socks - Performed by helper: Don/doff right sock, Don/doff left sock Shoes - Performed by helper: Don/doff left shoe, Don/doff right shoe Assist for footwear: Dependant Assist for lower body dressing: 2 Helpers  Function - Toileting Toileting activity did not occur: No continent bowel/bladder event Toileting steps completed by helper: Adjust clothing prior to toileting, Performs perineal hygiene, Adjust clothing after toileting Assist level: Two helpers  Function - Air cabin crew transfer activity did not occur: Safety/medical concerns Toilet transfer assistive device: Mechanical lift Assist level to toilet: 2 helpers(per Candice, NT report)  Function - Chair/bed transfer Chair/bed transfer method: Lateral scoot Chair/bed transfer assist level: 2 helpers Chair/bed transfer assistive device: Sliding board, Armrests Mechanical lift: Clarise Cruz Chair/bed transfer details: Manual facilitation for weight shifting, Manual facilitation for weight bearing, Manual facilitation for placement, Verbal cues for technique, Verbal cues for sequencing  Function - Locomotion: Wheelchair Will patient use wheelchair at  discharge?: Yes Type: Manual Wheelchair activity did not occur: Safety/medical concerns Assist Level: Dependent (Pt equals 0%)(TIS) Wheel 50 feet with 2  turns activity did not occur: Safety/medical concerns Wheel 150 feet activity did not occur: Safety/medical concerns Function - Locomotion: Ambulation Ambulation activity did not occur: Safety/medical concerns Walk 10 feet activity did not occur: Safety/medical concerns Walk 50 feet with 2 turns activity did not occur: Safety/medical concerns Walk 150 feet activity did not occur: Safety/medical concerns Walk 10 feet on uneven surfaces activity did not occur: Safety/medical concerns  Function - Comprehension Comprehension: Auditory Comprehension assist level: Understands basic 90% of the time/cues < 10% of the time  Function - Expression Expression: Verbal Expression assist level: Expresses basic 75 - 89% of the time/requires cueing 10 - 24% of the time. Needs helper to occlude trach/needs to repeat words.  Function - Social Interaction Social Interaction assist level: Interacts appropriately 90% of the time - Needs monitoring or encouragement for participation or interaction.  Function - Problem Solving Problem solving assist level: Solves basic 75 - 89% of the time/requires cueing 10 - 24% of the time  Function - Memory Memory assist level: Recognizes or recalls 75 - 89% of the time/requires cueing 10 - 24% of the time Patient normally able to recall (first 3 days only): Current season, Staff names and faces, That he or she is in a hospital  Medical Problem List and Plan:  1. 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 DVT Prophylaxis/Anticoagulation: Pharmaceutical: eliquis no clinical evidence of DVT   3. Pain Management:  tylenol prn    -Add low-dose baclofen for tone increase.  Observe closely for tolerance 4. Mood: LCSW to follow for evaluation and support  5. Neuropsych: This patient is not capable of making decisions on his own behalf.  6. Skin/Wound Care: routine pressure relief measures  7. Fluids/Electrolytes/Nutrition: monitor I/O. Offering liquids between meals to maintain adequate hydration.     -.  Eating quite well now.  Continue to push fluid intake  8. CAD s/p Maze/ Chronic A fib: Monitor HR bid. Eliquis   9. FUO: resolved 10 GERD: Managed with Protonix  11. CKD stage II: Baseline SCr 1.2.    Creatinine 1.23 on 07/25/2017   Encourage fluids   Continue to monitor 12. HTN: Monitor BP bid--Home medsCoreg, Lotensin, Amlodipine  Vitals:   07/30/17 1452 07/31/17 0015  BP: 106/75 104/75  Pulse: 83 70  Resp: 17 16  Temp: 97.8 F (36.6 C) 97.8 F (36.6 C)  SpO2: 100% 100%  Resumed amlodipine 5mg  2/5, increased to 10mg  2/8, improved with  Lotensin, controlled 07/31/2017   Improv improved control overall.  Avoid overtreatment 13. Irritation left [id number] with  ciloxan gtts, tx x 1 wk, d/ced 2/11   Currently patched 14. Hypophosphatemia   Potassium 4.2 on 07/25/2017   Supplement decreased to daily on 2/9  15 Leukocytosis  WBCs 10.6 on 2/11   Afebrile   Cont to monitor   LOS (Days) 16 A FACE TO FACE EVALUATION WAS PERFORMED  SWARTZ,ZACHARY T 07/31/2017, 10:17 AM

## 2017-08-01 ENCOUNTER — Inpatient Hospital Stay (HOSPITAL_COMMUNITY): Payer: Medicare Other | Admitting: Physical Therapy

## 2017-08-01 ENCOUNTER — Inpatient Hospital Stay (HOSPITAL_COMMUNITY): Payer: Medicare Other | Admitting: Speech Pathology

## 2017-08-01 ENCOUNTER — Inpatient Hospital Stay (HOSPITAL_COMMUNITY): Payer: Medicare Other

## 2017-08-01 LAB — CBC
HEMATOCRIT: 46.3 % (ref 39.0–52.0)
HEMOGLOBIN: 14.9 g/dL (ref 13.0–17.0)
MCH: 29.7 pg (ref 26.0–34.0)
MCHC: 32.2 g/dL (ref 30.0–36.0)
MCV: 92.2 fL (ref 78.0–100.0)
Platelets: 321 10*3/uL (ref 150–400)
RBC: 5.02 MIL/uL (ref 4.22–5.81)
RDW: 14 % (ref 11.5–15.5)
WBC: 7 10*3/uL (ref 4.0–10.5)

## 2017-08-01 LAB — BASIC METABOLIC PANEL
ANION GAP: 9 (ref 5–15)
BUN: 24 mg/dL — AB (ref 6–20)
CO2: 25 mmol/L (ref 22–32)
Calcium: 9.9 mg/dL (ref 8.9–10.3)
Chloride: 108 mmol/L (ref 101–111)
Creatinine, Ser: 1.18 mg/dL (ref 0.61–1.24)
GFR calc Af Amer: >60 mL/min (ref >60)
GFR, EST NON AFRICAN AMERICAN: 54 mL/min — AB (ref >60)
GLUCOSE: 96 mg/dL (ref 65–99)
POTASSIUM: 4 mmol/L (ref 3.5–5.1)
Sodium: 142 mmol/L (ref 135–145)

## 2017-08-01 NOTE — Plan of Care (Signed)
  Progressing Consults RH STROKE PATIENT EDUCATION Description See Patient Education module for education specifics  08/01/2017 1341 - Progressing by Glean Salen, RN [facility] STROKE PATIENT EDUCATION Description See Patient Education module for education specifics  08/01/2017 1341 - Progressing by Glean Salen, RN RH BOWEL ELIMINATION RH STG MANAGE BOWEL W/MEDICATION W/ASSISTANCE Description STG Manage Bowel with Medication with mod Assistance.  08/01/2017 1341 - Progressing by Glean Salen, RN Flowsheets Taken 08/01/2017 1341  STG: Pt will manage bowels with medication with assistance 1-Total assistance RH BLADDER ELIMINATION RH STG MANAGE BLADDER WITH ASSISTANCE Description STG Manage Bladder With max Assistance   08/01/2017 1341 - Progressing by Glean Salen, RN Flowsheets Taken 08/01/2017 1341  STG: Pt will manage bladder with assistance 1-Total assistance RH SKIN INTEGRITY RH STG SKIN FREE OF INFECTION/BREAKDOWN Description No new breakdown with mod  assist    08/01/2017 1341 - Progressing by Glean Salen, RN RH SAFETY RH STG ADHERE TO SAFETY PRECAUTIONS W/ASSISTANCE/DEVICE Description STG Adhere to Safety Precautions With Min Assistance/Device.  08/01/2017 1341 - Progressing by Glean Salen, RN Flowsheets Taken 08/01/2017 1341  STG:Pt will adhere to safety precautions with assistance/device 2-Maximum assistance

## 2017-08-01 NOTE — Progress Notes (Signed)
Social Work Patient ID: Arthur [name], male   DOB: Apr 23, 1929, 84 y.o.   MRN: [id number]  [name] has given pt until Wed to find a NH and get insurance approval. Have contacted wife to let her know and she would like to look into [facility] if possible. Will proceed with paperwork and see if [facility] takes [facility] and has a bed for pt.

## 2017-08-01 NOTE — Progress Notes (Signed)
Subjective/Complaints:  No issues overnite, joking woith SLP, diet upgrade to D2!  ROS: pt denies nausea, vomiting, diarrhea, cough, shortness of breath or chest pain   Objective:  Vital Signs: Blood pressure (!) 144/81, pulse 80, temperature 97.9 F (36.6 C), temperature source Oral, resp. rate 16, height 5' 7" (1.702 m), weight 80 kg (176 lb 5.9 oz), SpO2 98 %.  Results for orders placed or performed during the hospital encounter of 07/15/17 (from the past 72 hour(s))  Basic metabolic panel     Status: Abnormal   Collection Time: 08/01/17  4:50 AM  Result Value Ref Range   Sodium 142 135 - 145 mmol/L   Potassium 4.0 3.5 - 5.1 mmol/L   Chloride 108 101 - 111 mmol/L   CO2 25 22 - 32 mmol/L   Glucose, Bld 96 65 - 99 mg/dL   BUN 24 (H) 6 - 20 mg/dL   Creatinine, Ser 1.18 0.61 - 1.24 mg/dL   Calcium 9.9 8.9 - 10.3 mg/dL   GFR calc non Af Amer 54 (L) >60 mL/min   GFR calc Af Amer >60 >60 mL/min    Comment: (NOTE) The eGFR has been calculated using the CKD EPI equation. This calculation has not been validated in all clinical situations. eGFR's persistently <60 mL/min signify possible Chronic Kidney Disease.    Anion gap 9 5 - 15    Comment: Performed at [facility] [address] [place] [address]  CBC     Status: None   Collection Time: 08/01/17  4:50 AM  Result Value Ref Range   WBC 7.0 4.0 - 10.5 K/uL   RBC 5.02 4.22 - 5.81 MIL/uL   Hemoglobin 14.9 13.0 - 17.0 g/dL   HCT 46.3 39.0 - 52.0 %   MCV 92.2 78.0 - 100.0 fL   MCH 29.7 26.0 - 34.0 pg   MCHC 32.2 30.0 - 36.0 g/dL   RDW 14.0 11.5 - 15.5 %   Platelets 321 150 - 400 K/uL    Comment: Performed at [facility], [place] [address] [place], [place] [address]     HEENT:   Normocephalic. Atraumatic. Cardio: RRR without murmur. No JVD  . Resp:CTA Bilaterally without wheezes or rales. Normal effort  GI: BS positive and ND Musculoskeletal:  No Edema and no tenderness Skin:   Other intact. Warm and  dry. Neuro: Alert/Oriented Motor LUE: 1/5 shoulder abduction, 2- left elbow flexor, distally 0/5 LLE: 2-/5 proximal to distal  Right low extremity 3/5 proximal to distal Increased tone left upper extremity at elbow flexors, mAS 1-2--motor and sensory exam unchanged Gen NAD. Vital signs reviewed.  Assessment/Plan: 1. 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 Physiatrist is providing close team supervision and 24 hour management of active medical problems listed below. Physiatrist and rehab team continue to assess barriers to discharge/monitor patient progress toward functional and medical goals. FIM: Function - Bathing Position: Bed Body parts bathed by patient: Chest, Abdomen, Right upper leg Body parts bathed by helper: Right arm, Left arm, Front perineal area, Buttocks, Back, Left lower leg, Right lower leg, Left upper leg, Chest, Abdomen, Right upper leg Assist Level: 2 helpers  Function- Upper Body Dressing/Undressing What is the patient wearing?: Pull over shirt/dress Pull over shirt/dress - Perfomed by patient: Thread/unthread right sleeve Pull over shirt/dress -  Perfomed by helper: Thread/unthread left sleeve, Put head through opening, Pull shirt over trunk Button up shirt - Perfomed by patient: Thread/unthread right sleeve Button up shirt - Perfomed by helper: Thread/unthread left sleeve, Pull shirt around back, Button/unbutton shirt Assist Level: 2 helpers Function - Lower Body Dressing/Undressing Lower body dressing/undressing activity did not occur: N/A What is the patient wearing?: Socks Position: Bed Pants- Performed by helper: Thread/unthread left pants leg, Pull pants up/down, Thread/unthread right pants leg Non-skid slipper socks- Performed by helper: Don/doff left sock,  Don/doff right sock Socks - Performed by helper: Don/doff right sock, Don/doff left sock Shoes - Performed by helper: Don/doff left shoe, Don/doff right shoe Assist for footwear: Dependant Assist for lower body dressing: 2 Helpers  Function - Toileting Toileting activity did not occur: No continent bowel/bladder event Toileting steps completed by helper: Adjust clothing prior to toileting, Performs perineal hygiene, Adjust clothing after toileting Assist level: Two helpers  Function - Air cabin crew transfer activity did not occur: Safety/medical concerns Toilet transfer assistive device: Mechanical lift Assist level to toilet: 2 helpers(per Candice, NT report)  Function - Chair/bed transfer Chair/bed transfer method: Lateral scoot Chair/bed transfer assist level: 2 helpers Chair/bed transfer assistive device: Sliding board, Armrests Mechanical lift: Clarise Cruz Chair/bed transfer details: Manual facilitation for weight shifting, Manual facilitation for weight bearing, Manual facilitation for placement, Verbal cues for technique, Verbal cues for sequencing  Function - Locomotion: Wheelchair Will patient use wheelchair at discharge?: Yes Type: Manual Wheelchair activity did not occur: Safety/medical concerns Assist Level: Dependent (Pt equals 0%)(TIS) Wheel 50 feet with 2 turns activity did not occur: Safety/medical concerns Wheel 150 feet activity did not occur: Safety/medical concerns Function - Locomotion: Ambulation Ambulation activity did not occur: Safety/medical concerns Walk 10 feet activity did not occur: Safety/medical concerns Walk 50 feet with 2 turns activity did not occur: Safety/medical concerns Walk 150 feet activity did not occur: Safety/medical concerns Walk 10 feet on uneven surfaces activity did not occur: Safety/medical concerns  Function - Comprehension Comprehension: Auditory Comprehension assist level: Understands basic 90% of the time/cues < 10% of the  time  Function - Expression Expression: Verbal Expression assist level: Expresses basic 75 - 89% of the time/requires cueing 10 - 24% of the time. Needs helper to occlude trach/needs to repeat words.  Function - Social Interaction Social Interaction assist level: Interacts appropriately 90% of the time - Needs monitoring or encouragement for participation or interaction.  Function - Problem Solving Problem solving assist level: Solves basic 75 - 89% of the time/requires cueing 10 - 24% of the time  Function - Memory Memory assist level: Recognizes or recalls 75 - 89% of the time/requires cueing 10 - 24% of the time Patient normally able to recall (first 3 days only): Current season, Staff names and faces, That he or she is in a hospital  Medical Problem List and Plan:  1. Functional deficits and left hemiparesis secondary to right MCA infarct, small right ACA infarct, posterior watershed infarct due to cardio-embolic source    Cont CIR PT, OT, SLP-progressing with swallow2. DVT Prophylaxis/Anticoagulation: Pharmaceutical: eliquis no clinical evidence of DVT   3. Pain Management: tylenol prn    -Add low-dose baclofen for tone increase.  Observe closely for tolerance 4. Mood: LCSW to follow for evaluation and support  5. Neuropsych: This patient is not capable of making decisions on his own behalf.  6. Skin/Wound Care: routine pressure relief measures  7. Fluids/Electrolytes/Nutrition: monitor I/O. Offering liquids between meals to maintain adequate  hydration.     -.  Eating quite well now.  Continue to push fluid intake  8. CAD s/p Maze/ Chronic A fib: Monitor HR bid. Eliquis   9. FUO: resolved 10 GERD: Managed with Protonix  11. CKD stage II: Baseline SCr 1.2.    Creatinine 1.18 on 08/01/2017   Encourage fluids   Continue to monitor 12. HTN: Monitor BP bid--Home medsCoreg, Lotensin, Amlodipine  Vitals:   07/31/17 1442 08/01/17 0006  BP: 109/74 (!) 144/81  Pulse: 86 80  Resp: 17  16  Temp: (!) 97.5 F (36.4 C) 97.9 F (36.6 C)  SpO2: 96% 98%  Resumed amlodipine 4m 2/5, increased to 133m2/8, improved with  Lotensin, controlled 08/01/2017   Improv improved control overall.  Avoid overtreatment 13. Irritation left eyWCB:JSEGBTDVith  ciloxan gtts, tx x 1 wk, d/ced 2/11   Currently patched 14. Hypophosphatemia   Potassium 4.2 on 07/25/2017   Supplement decreased to daily on 2/9  15 Leukocytosis  WBCs 10.6 on 2/11   Afebrile   Cont to monitor   LOS (Days) 17 A FACE TO FACE EVALUATION WAS PERFORMED  AnCharlett Blake/18/2019, 8:51 AM

## 2017-08-01 NOTE — Plan of Care (Signed)
  Not Progressing RH BOWEL ELIMINATION RH STG MANAGE BOWEL WITH ASSISTANCE Description STG Manage Bowel with Mod Assistance.  Incontinent-total assist 08/01/2017 0109 - Not Progressing by Cornell Barman, RN RH STG MANAGE BOWEL W/MEDICATION W/ASSISTANCE Description STG Manage Bowel with Medication with mod Assistance.  Incontinent-total assist 08/01/2017 0109 - Not Progressing by Cornell Barman, RN

## 2017-08-01 NOTE — Progress Notes (Signed)
Rested quietly during night except for one incident of attempting to get OOB without assistance. Bed alarm in place. Patient removed eye patch during night. Left WHO and left PRAFO in place. Elbow protector in place to left elbow. Tone to LUE and LLE. Arthur [name] A

## 2017-08-01 NOTE — Progress Notes (Signed)
Occupational Therapy Session Note  Patient Details  Name: Arthur [name] MRN: [id number] Date of Birth: 18-Jul-1928  Today's Date: 08/01/2017 OT Individual Time: 1030-1130 OT Individual Time Calculation (min): 60 min    Skilled Therapeutic Interventions/Progress Updates:    1:1. Focus of session on L visual scanning/attention, adaptive bathing and dressing, and tone reduction. Pt supine in bed upon arrival. Pt chooses clothing from provided options in field of 3 on L side of bed for forced L attention with increased time and min VC for turning head. Pt becomes tearful talking about home and missing visiting with family/neighbors. OT provides support and encouragement. Pt washes UB with HOH A to wash LUE with RUE and L to obtain bathing items. Pt requires mod VC for sequencing bathing body parts as he gets distracted in conversation. Pt dons pull over shirt with A to thread LUE, pull shirt down trunk, and VC for problem solving threading head into shirt/hemi dressing techniques. OT washes B lower legs and threads BLE into pants. Pt rolls B to advance pants past hips with min A to roll L and MAX A to roll R. Provided PROM in all planes of motion of houlder, elbow and hand of LUE to reduce tone and risk of contracture. Exited session with pt seated in w/c and call light in reach  (note signed 3 days late in error)  Therapy Documentation Precautions:  Precautions Precautions: Fall Precaution Comments: L inattention Restrictions Weight Bearing Restrictions: No  See Function Navigator for Current Functional Status.   Therapy/Group: Individual Therapy  Tonny Branch 08/01/2017, 12:10 PM

## 2017-08-01 NOTE — Progress Notes (Signed)
Speech Language Pathology Daily Session Note  Patient Details  Name: Arthur [name] MRN: [id number] Date of Birth: 12-Nov-1928  Today's Date: 08/01/2017 SLP Individual Time: 0830-0930 SLP Individual Time Calculation (min): 60 min  Short Term Goals: Week 3: SLP Short Term Goal 1 (Week 3): Pt will demonstrate complex problem solving during functional tasks with supervision verbal cues.  SLP Short Term Goal 2 (Week 3): Pt will utilize speech intelligibility strategies to achieve ~ 75% intelligibility at the sentence level with supervision A verbal cues.  SLP Short Term Goal 3 (Week 3): Pt will consume current diet with minimal overt s/s of aspiraton with supervision verbal cues for use of swallowing compensatory strategies.  SLP Short Term Goal 4 (Week 3): Patient will attend to left field of enviornment during functional tasks with Supervision verbal cues.   Skilled Therapeutic Interventions: Skilled treatment session focused on dysphagia and cognitive goals. SLP facilitated session by providing supervision verbal cues for recall of swallowing compensatory strategies. Pt consumed breakfast of dys. 1 textures with thin liquids without overt s/s of aspiration and required Min A verbal cues for utilization of swallowing compensatory strategies. SLP further facilitated session by administering trials of dys. 2 textures. Pt consumed trials without overt s/s of aspiration and required supervision verbal cues to self-monitor and correct left pocketing. Recommend pt upgrade to dys. 2 textures. Pt demonstrated attention to left field of environment with Mod I throughout tasks and problem solving with self-feeding with Supervision verbal cues. Pt left upright in bed with RN present. Continue with current plan of care.     Function:  Eating Eating   Modified Consistency Diet: Yes Eating Assist Level: Supervision or verbal cues;Helper checks for pocketed food   Eating Set Up Assist For: Opening  containers       Cognition Comprehension Comprehension assist level: Understands basic 90% of the time/cues < 10% of the time  Expression   Expression assist level: Expresses basic 75 - 89% of the time/requires cueing 10 - 24% of the time. Needs helper to occlude trach/needs to repeat words.  Social Interaction Social Interaction assist level: Interacts appropriately 90% of the time - Needs monitoring or encouragement for participation or interaction.  Problem Solving Problem solving assist level: Solves basic 75 - 89% of the time/requires cueing 10 - 24% of the time  Memory Memory assist level: Recognizes or recalls 75 - 89% of the time/requires cueing 10 - 24% of the time    Pain Pain Assessment Pain Assessment: No/denies pain  Therapy/Group: Individual Therapy  Meredeth Ide  SLP - Student 08/01/2017, 12:17 PM

## 2017-08-01 NOTE — Progress Notes (Addendum)
Social Work Patient ID: Arthur [name], male   DOB: [birth year], 84 y.o.   MRN: [id number]  Wife does not want pt to be told yet about going to a NH. She feels it will hurt his morale in therapies. Encouraged her to tell him before some other staff member accidentally tells him. I offered to talk with the both of them and wife declined for today wanted him to rest. Will continue to work on obtaining NH bed. Wife wants to look at [place] along with [employer]

## 2017-08-01 NOTE — Progress Notes (Signed)
Physical Therapy Session Note  Patient Details  Name: Arthur [name] MRN: [id number] Date of Birth: July 08, 1928  Today's Date: 08/01/2017 PT Individual Time: 1300-1415 PT Individual Time Calculation (min): 75 min   Short Term Goals: Week 2:  PT Short Term Goal 1 (Week 2): Pt will maintain static sitting balance with min assist x10 minutes PT Short Term Goal 2 (Week 2): Pt will stand with +1 assist PT Short Term Goal 3 (Week 2): pt will maintain dynamic sitting balance with mod assist  Skilled Therapeutic Interventions/Progress Updates:    no c/o pain.  Session focus on attention, visual scanning, posture, and weight shifting.    Pt finishing lunch tray on arrival.  PT provided skilled supervision for completion of lunch tray with occasional verbal cues for attention to L pocketing and to recall swallowing strategies.    Sit>supine with max assist to L side, once sitting able to maintain static sitting balance EOB with supervision.  Squat/pivot transfer to w/c on pt's R with max assist to facilitate forward weight shift, head/hips relationship, and to clear bottom from bed.  Transfer to therapy mat to pt's L side with mod assist and max multimodal cues for weight shifting and head/hips and to decrease pushing.    On therapy mat, pt with static sitting balance ranging supervision/mod assist with visual feedback from pressure mapping system.  Pt able to maintain R bias in sitting with verbal cues x5 minutes.  Attempted sit<>stand with therapist in front and on side, but unable to stand with +1 assist.  Sit<>stand x4 in sara + with focus on L weight shift and pre-gait with RLE.  On final stand, pt demonstrates improved L weight shift during stand.    Pt positioned in w/c at end of session with call bell in reach and needs met.   Therapy Documentation Precautions:  Precautions Precautions: Fall Precaution Comments: L inattention Restrictions Weight Bearing Restrictions: No   See Function  Navigator for Current Functional Status.   Therapy/Group: Individual Therapy  Michel Santee 08/01/2017, 4:01 PM

## 2017-08-01 NOTE — Discharge Summary (Signed)
Physician Discharge Summary  Patient ID: Arthur [name] MRN: [id number] DOB/AGE: 1929/01/09 84 y.o.  Admit date: 07/15/2017 Discharge date: 08/03/2017  Discharge Diagnoses:  Principal Problem:   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 [facility]) Active Problems:   Left spastic hemiparesis (HCC)   Hypophosphatemia   Irritation of left eye   Stage 3 chronic kidney disease (HCC)   Acute blood loss anemia   Hypernatremia   Leukocytosis   Discharged Condition: stable   Significant Diagnostic Studies: N/A   Labs:  Basic Metabolic Panel: BMP Latest Ref Rng & Units 08/01/2017 07/25/2017 07/18/2017  Glucose 65 - 99 mg/dL 96 98 149(H)  BUN 6 - 20 mg/dL 24(H) 26(H) 26(H)  Creatinine 0.61 - 1.24 mg/dL 1.18 1.23 1.09  Sodium 135 - 145 mmol/L 142 144 146(H)  Potassium 3.5 - 5.1 mmol/L 4.0 4.2 4.0  Chloride 101 - 111 mmol/L 108 108 114(H)  CO2 22 - 32 mmol/L 25 26 19(L)  Calcium 8.9 - 10.3 mg/dL 9.9 9.5 8.9    CBC: CBC Latest Ref Rng & Units 08/01/2017 07/25/2017 07/18/2017  WBC 4.0 - 10.5 K/uL 7.0 10.6(H) 8.9  Hemoglobin 13.0 - 17.0 g/dL 14.9 14.5 12.9(L)  Hematocrit 39.0 - 52.0 % 46.3 45.0 40.2  Platelets 150 - 400 K/uL 321 402(H) 276    CBG: No results for input(s): GLUCAP in the last 168 hours.  Brief HPI:   Arthur [name] a 84 y.o.malewith history of CKD, CAD s/p CABG, CVA, A fib s/p MAZE- chronic coumadin who was admitted on 07/11/17 with left sided weakness with right gaze preference. INR subtherapeutic at admission and CTA head neck showed calcified plaque of aorta and carotid bifurcation, right M2 superior division proximal occlusion with poor down stream collateralization and multiple areas of mild to moderate intracranial atherosclerosis. He underwent cerebral angio with complete revascularization of R-ACA with IA tPA and IV tPA. MRI/MRA brain done revealing acute moderate R-MCA, small right distal ACA infarcts, small right posterior watershed territory infarct with  minimal petechial hemorrhage and old large left cerebellar infarct with successful revascularization of R-MCA occlusion.  He tolerated   MBS done yesterday and he was placed on  Dysphagia 1, nectar liquids. He developed fever yesterday pm with leucocytosis and CXR showed low lung volumes. UA negative and he was started on Levaquin due to concerns of aspiration PNA.  Stroke felt to be embolic due to subtherapeutic coumadin. Anticoagulation was on hold due to size of stroke with recommendations to start Eliquis in 5-7 days.   Patient with resultant left hemiparesis, left inattention with right gaze preference, dysphagia and left facial droop. Therapy ongoing and CIR recommended due to functional deficits.     Hospital Course: Arthur [name] was admitted to rehab 82/06/2017 for inpatient therapies to consist of PT, ST and OT at least three hours five days a week. Past admission physiatrist, therapy team and rehab RN have worked together to provide customized collaborative inpatient rehab. He was maintained on amantadine for activation and this was discontinued on 2/13 as lethargy has resolved.  His IVF were changed to nights to prevent fluid overload. Levaquin was discontinue on 2/4 as no signs of infection noted and leucocytosis has resolved. Renal status has been monitored during his stay and SCr has improved to 1.18  Magnesium levels have improved to 2.2 and phosphorous supplement was decreased as hypophosphatemia is resolving. Hypokalemia has resolved with brief supplementation. Hypernatremia has resolved with increase in fluid intake and d/c of IVF.  Blood pressures have been closely monitored and amlodipine was resumed and lotensin was added to help with better control.   Eliquis was initiated on 2/4 with serial CBC showing H/H to be stable. Conjunctivitis left eye was treated with one week course of ciloxan eye drops .  Tone with spasticity due to left hemiparesis was treated with addition of baclofen and  he is tolerating this without SE. He has been making slow but steady progress and continues to be limited by left inattention, left hemiparesis as well as cognitive impairments.  He currenty requires extensive amount of assistance and family is unable to provide care needed. He was discharged to [facility] on 08/03/17 for further therapy    Rehab course: During patient's stay in rehab weekly team conferences were held to monitor patient's progress, set goals and discuss barriers to discharge. At admission, patient required total assist with basic self care tasks and with mobility. He presented with moderate dysarthria with low voice volume with MoCA Blind score 18/22 required significant amount of time for cognitive tasks.  He  has had improvement in activity tolerance, balance, postural control as well as ability to compensate for deficits.   He is able to complete bathing with hand over hand assist for [facility] assist and moderate cues for sequencing. He requires min to max assist for LE care.  He requires max assist with transfers and to maintain static sitting balance. He has decreased in pusher tendencies with sitting  Therapy is working on standing in [place] with focus on weight shifting on LLE. He is tolerating dysphagia 2 diet, thin liwuids without S/S of aspirarion and needs verbal cues to adhere to safe swallow strategies. He continues to demonstrate mild cognitive impairments and needs min assist with extra time to complete familiar and functional tasks safely  .   Disposition: Skilled Nursing Facility  Diet: Dysphagia 2, thin liquids. Needs full supervision for safety with intake.   Special Instructions: 1. No straws. Administer medications whole in puree. Cough after sips of liquids.  2. Offer fluids between meals to maintain adequate hydration.  3. Recheck BMET in a week.   Allergies as of 08/03/2017      Reactions   Tape Other (See Comments)   PATIENT IS TAKING COUMADIN; HIS SKIN TEARS &  BRUISES EASILY; PLEASE USE COBAN WRAP OR AN ALTERNATIVE TO MEDICAL TAPE!!   Lasix [furosemide] Rash   Penicillins Rash   Has patient had a PCN reaction causing immediate rash, facial/tongue/throat swelling, SOB or lightheadedness with hypotension: Yes Has patient had a PCN reaction causing severe rash involving mucus membranes or skin necrosis: Unk Has patient had a PCN reaction that required hospitalization: Unk Has patient had a PCN reaction occurring within the last 10 years:  If all of the above answers are "NO", then may proceed with Cephalosporin use.   Sulfa Drugs Cross Reactors Rash      Medication List    STOP taking these medications   carvedilol 25 MG tablet Commonly known as:  COREG   warfarin 2.5 MG tablet Commonly known as:  COUMADIN     TAKE these medications   acetaminophen 650 MG CR tablet Commonly known as:  TYLENOL Take 650 mg by mouth daily as needed (for knee pain).   amLODipine 10 MG tablet Commonly known as:  NORVASC Take 1 tablet (10 mg total) by mouth daily. Start taking on:  08/04/2017 What changed:    medication strength  how much to take  apixaban 5 MG Tabs tablet Commonly known as:  ELIQUIS Take 1 tablet (5 mg total) by mouth 2 (two) times daily.   artificial tears Oint ophthalmic ointment Commonly known as:  LACRILUBE Place into the left eye at bedtime.   Baclofen 5 MG Tabs Take 5 mg by mouth 2 (two) times daily.   benazepril 5 MG tablet Commonly known as:  LOTENSIN Take 1 tablet (5 mg total) by mouth daily. Start taking on:  08/04/2017 What changed:    medication strength  how much to take   diclofenac sodium 1 % Gel Commonly known as:  VOLTAREN Apply 2 g topically 4 (four) times daily.   NITROSTAT 0.4 MG SL tablet Generic drug:  nitroGLYCERIN PLACE 1 TABLET (0.4 MG TOTAL) UNDER THE TONGUE EVERY 5 (FIVE) MINUTESAS NEEDED FOR CHEST PAIN (UP TO 3 DOSES).   pantoprazole 40 MG tablet Commonly known as:  PROTONIX Take 1  tablet (40 mg total) by mouth daily. Start taking on:  08/04/2017   phosphorus 155-852-130 MG tablet Commonly known as:  K PHOS NEUTRAL Take 1 tablet (250 mg total) by mouth daily. Start taking on:  08/04/2017   polyvinyl alcohol 1.4 % ophthalmic solution Commonly known as:  LIQUIFILM TEARS Place 1-2 drops into the left eye 4 (four) times daily -  with meals and at bedtime.   ranitidine 150 MG tablet Commonly known as:  ZANTAC TAKE ONE TABLET BY MOUTH TWICE A DAY What changed:    how much to take  how to take this  when to take this   rosuvastatin 20 MG tablet Commonly known as:  CRESTOR TAKE 1 TABLET BY MOUTH DAILY What changed:    how much to take  how to take this  when to take this       Contact information for follow-up providers    Kirsteins, Luanna Salk, MD Follow up.   Specialty:  Physical Medicine and Rehabilitation Why:  Office will call you with follow up appointment Contact information: [place] [address] [phone]        Dennie Bible, NP. Call in 1 day(s).   Specialty:  Family Medicine Why:  for follow up appointment3-4 weeks Contact information: [address] [place] [address] [phone]            Contact information for after-discharge care    Destination    [facility] .   Service:  Skilled Nursing Contact information: [address] [place] [phone]                  Signed: Bary Leriche 08/03/2017, 10:37 AM

## 2017-08-02 ENCOUNTER — Inpatient Hospital Stay (HOSPITAL_COMMUNITY): Payer: Medicare Other | Admitting: Occupational Therapy

## 2017-08-02 ENCOUNTER — Inpatient Hospital Stay (HOSPITAL_COMMUNITY): Payer: Medicare Other | Admitting: Physical Therapy

## 2017-08-02 ENCOUNTER — Inpatient Hospital Stay (HOSPITAL_COMMUNITY): Payer: Medicare Other | Admitting: Speech Pathology

## 2017-08-02 NOTE — Plan of Care (Signed)
  RH Bed Mobility LTG Patient will perform bed mobility with assist (PT) Description LTG: Patient will perform bed mobility with assistance, with/without cues (PT). 08/02/2017 1521 - Not Met (add Reason) by Michel Santee, PT Note Shortened LOS due to insurance   RH Bed to Chair Transfers LTG Patient will perform bed/chair transfers w/assist (PT) Description LTG: Patient will perform bed/chair transfers with assistance, with/without cues (PT). 08/02/2017 1521 - Not Met (add Reason) by Michel Santee, PT Note Shortened LOS due to insurance   RH Car Transfers LTG Patient will perform car transfers with assist (PT) Description LTG: Patient will perform car transfers with assistance (PT). 8/38/1840 3754 - Not Applicable by Michel Santee, PT   RH Ambulation LTG Patient will ambulate in controlled environment (PT) Description LTG: Patient will ambulate in a controlled environment, # of feet with assistance (PT). 08/02/2017 1521 - Not Met (add Reason) by Michel Santee, PT Note Unable to attempt 2/2 shortened LOS due to insurance coverage   RH Wheelchair Mobility LTG Patient will propel w/c in controlled environment (PT) Description LTG: Patient will propel wheelchair in controlled environment, # of feet with assist (PT) 08/02/2017 1521 - Not Met (add Reason) by Michel Santee, PT Note Shortened LOS due to insurance LTG Patient will propel w/c in home environment (PT) Description LTG: Patient will propel wheelchair in home environment, # of feet with assistance (PT). 08/02/2017 1521 - Not Met (add Reason) by Michel Santee, PT Note Shortened LOS due to insurance LTG Patient will propel w/c in community environment (PT) Description LTG: Patient will propel wheelchair in community environment, # of feet with assist (PT) 08/02/2017 1521 - Not Met (add Reason) by Michel Santee, PT Note Shortened LOS due to insurance   RH Stairs LTG Patient will ambulate up and  down stairs w/assist (PT) Description LTG: Patient will ambulate up and down # of stairs with assistance (PT) 08/02/2017 1521 - Not Met (add Reason) by Michel Santee, PT Note Shortened LOS due to insurance

## 2017-08-02 NOTE — Plan of Care (Signed)
  RH Balance LTG Patient will maintain dynamic standing with ADLs (OT) Description LTG:  Patient will maintain dynamic standing balance with assist during activities of daily living (OT)  08/02/2017 2004 - Not Met (add Reason) by Valma Cava, OT Note Shortened LOS 2/2 insurance approval for SNF   [facility] Dressing LTG Patient will perform upper body dressing (OT) Description LTG Patient will perform upper body dressing with assist, with/without cues (OT). 08/02/2017 2004 - Not Met (add Reason) by Valma Cava, OT Note Shortened LOS 2/2 insurance approval for SNF   [facility] Dressing LTG Patient will perform lower body dressing w/assist (OT) Description LTG: Patient will perform lower body dressing with assist, with/without cues in positioning using equipment (OT) 08/02/2017 2004 - Not Met (add Reason) by Nima Kemppainen, Daneen Schick, OT Note Shortened LOS 2/2 insurance approval for SNF   RH Toileting LTG Patient will perform toileting w/assist, cues/equip (OT) Description LTG: Patient will perform toiletiing (clothes management/hygiene) with assist, with/without cues using equipment (OT) 08/02/2017 2004 - Not Met (add Reason) by Marielena Harvell, Daneen Schick, OT Note Shortened LOS 2/2 insurance approval for SNF   [facility] Toilet Transfers LTG Patient will perform toilet transfers w/assist (OT) Description LTG: Patient will perform toilet transfers with assist, with/without cues using equipment (OT) 08/02/2017 2004 - Not Met (add Reason) by Keri Tavella, Daneen Schick, OT Note Shortened LOS 2/2 insurance approval for SNF

## 2017-08-02 NOTE — Progress Notes (Signed)
Speech Language Pathology Daily Session Note  Patient Details  Name: Arthur [name] MRN: [id number] Date of Birth: 08-26-1928  Today's Date: 08/02/2017 SLP Individual Time: 0730-0830 SLP Individual Time Calculation (min): 60 min  Short Term Goals: Week 3: SLP Short Term Goal 1 (Week 3): Pt will demonstrate complex problem solving during functional tasks with supervision verbal cues.  SLP Short Term Goal 2 (Week 3): Pt will utilize speech intelligibility strategies to achieve ~ 75% intelligibility at the sentence level with supervision A verbal cues.  SLP Short Term Goal 3 (Week 3): Pt will consume current diet with minimal overt s/s of aspiraton with supervision verbal cues for use of swallowing compensatory strategies.  SLP Short Term Goal 4 (Week 3): Patient will attend to left field of enviornment during functional tasks with Supervision verbal cues.   Skilled Therapeutic Interventions: Skilled treatment session focused on dysphagia and cognitive goals. SLP facilitated session by providing supervision verbal cues for recall of swallowing compensatory strategies. Pt consumed breakfast of dys. 2 textures with thin liquids without overt s/s of aspiration. Pt attempted to clear left pocketing throughout meal; however, unsuccessful. Therefore, SLP had to move residue from left buccal pocket manually with a swab. Recommend pt continue current diet with full supervision to maximize safety. Pt demonstrated attention to left field of environment with Mod I throughout tasks and problem solving with self-feeding with Supervision verbal cues. Of note, pt appeared more lethargic this morning. Pt left upright in bed with student nurse present. Continue with current plan of care.    Function:  Eating Eating   Modified Consistency Diet: Yes Eating Assist Level: Set up assist for;Supervision or verbal cues;Helper checks for pocketed food   Eating Set Up Assist For: Opening containers        Cognition Comprehension Comprehension assist level: Understands basic 90% of the time/cues < 10% of the time  Expression   Expression assist level: Expresses basic 90% of the time/requires cueing < 10% of the time.  Social Interaction Social Interaction assist level: Interacts appropriately 90% of the time - Needs monitoring or encouragement for participation or interaction.  Problem Solving Problem solving assist level: Solves basic 90% of the time/requires cueing < 10% of the time  Memory Memory assist level: Recognizes or recalls 90% of the time/requires cueing < 10% of the time    Pain Pain Assessment Pain Assessment: No/denies pain   Therapy/Group: Individual Therapy  Meredeth Ide  SLP - Student 08/02/2017, 10:00 AM

## 2017-08-02 NOTE — Progress Notes (Addendum)
Occupational Therapy Progress Note  Patient Details  Name: Arthur [name] MRN: [id number] Date of Birth: Jan 22, 1929   Today's Date: 08/02/2017 OT Individual Time: 0930-1000 OT Individual Time Calculation (min): 30 min    Patient has met 3 of 4 short term goals.  Pt continues to make slow progress towards OT goals at this time, despite his motivation and hard work in therapy.  He has demonstrated improved attention to L side and can locate objects on L with min/mod questioning cues. Pt has also progressed with sitting balance and some improved body awareness. 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   OT Short Term Goals Week 2:  OT Short Term Goal 1 (Week 2): Pt will be able to roll in bed with mod A to assist caregivers with clothing changes. OT Short Term Goal 1 - Progress (Week 2): Progressing toward goal OT Short Term Goal 2 (Week 2): Pt will be able to sit to EOB with consistent Mod A to reduce caregiver burden. OT Short Term Goal 2 - Progress (Week 2): Met OT Short Term Goal 3 (Week 2): Pt will be able to wash UB with min A. OT Short Term Goal 3 - Progress (Week 2): Met OT Short Term Goal 4 (Week 2): Pt will scan to the L to locate 2/4 grooming items with min questioning cues. OT Short Term Goal 4 - Progress (Week 2): Met Week 3:  OT Short Term Goal 1 (Week 3): LTG=STG 2/2 ELOS  Skilled Therapeutic Interventions/Progress Updates:    Pt transferred to sitting EOB with Max A and HOB elevated. Pt achieved sitting balance with cues to reach towards foot board. Max A +2 SB transfer with focus on anterior weight shift and decreased pushing. Pt brought to therapy gym for activity using [employer] focused on L attention and functional use of L UE. OT provided hand over hand A to L UE to push buttons on light board. Pt needed min questioning cues to locate 75% of buttons on L side. Able to feel some initiation from scap and shoulder to initiate  move towards buttons. Pt returned to room at end of session and left with needs met and fulll lap tray in place.   Therapy Documentation Precautions:  Precautions Precautions: Fall Precaution Comments: L inattention Restrictions Weight Bearing Restrictions: No Pain: Pain Assessment Pain Assessment: No/denies pain Pain Score: 0-No pain ADL: ADL ADL Comments: refer to functional navigator    See Function Navigator for Current Functional Status.   Therapy/Group: Individual Therapy  Valma Cava 08/02/2017, 12:34 PM

## 2017-08-02 NOTE — Progress Notes (Signed)
Occupational Therapy Session Note  Patient Details  Name: Arthur [name] MRN: [id number] Date of Birth: [birth year]  Today's Date: 08/02/2017 OT Individual Time: 0930-1000 OT Individual Time Calculation (min): 30 min    Short Term Goals: Week 2:  OT Short Term Goal 1 (Week 2): Pt will be able to roll in bed with mod A to assist caregivers with clothing changes. OT Short Term Goal 1 - Progress (Week 2): Met OT Short Term Goal 2 (Week 2): Pt will be able to sit to EOB with consistent Mod A to reduce caregiver burden. OT Short Term Goal 2 - Progress (Week 2): Met OT Short Term Goal 3 (Week 2): Pt will be able to wash UB with min A. OT Short Term Goal 3 - Progress (Week 2): Met OT Short Term Goal 4 (Week 2): Pt will scan to the L to locate 2/4 grooming items with min questioning cues. OT Short Term Goal 4 - Progress (Week 2): Met   OT Short Term Goal 1 (Week 3): LTG=STG 2/2 ELOS  Skilled Therapeutic Interventions/Progress Updates:    Pt received in bed and agreeable to working on dressing and functional mobility from bed level.  To reduce tone in LLE, pt worked on bent knee sways with mod A to bend and maintain bent L knee in position.  He then donned pants with max A as he assisted pulling pants over R leg and hip. Pt worked on rolling side to side to in bed with min A.  Pt adjusted in bed in chair position and then he donned shirt with mod A.  Improved attention to L side.  PROM and tone reduction stretching to L hand, wrist and elbow. Pt positioned with pillows to support LUE. Pt in bed with alarm set with all needs met.  Therapy Documentation Precautions:  Precautions Precautions: Fall Precaution Comments: L inattention Restrictions Weight Bearing Restrictions: No      Pain: Pain Assessment Pain Assessment: No/denies pain Pain Score: 0-No pain ADL: ADL ADL Comments: refer to functional navigator   See Function Navigator for Current Functional Status.   Therapy/Group:  Individual Therapy  [place] 08/02/2017, 12:31 PM

## 2017-08-02 NOTE — Progress Notes (Signed)
Social Work  Discharge Note  The overall goal for the admission was met for:   Discharge location: [facility]  Length of Stay: Yes-19 DAYS  Discharge activity level: Yes-MOD ASSIST  Home/community participation: Yes  Services provided included: MD, RD, PT, OT, SLP, RN, CM, TR, Pharmacy, Neuropsych and SW  Financial Services: Private Insurance: [place]  Follow-up services arranged: Other: NHP  Comments (or additional information):PT IS TOO MUCH CARE FOR WIFE AT THIS TIME NEEDS MORE REHAB TO GET TO A LEVEL WHERE HE CAN RETURN HOME WITH HER  Patient/Family verbalized understanding of follow-up arrangements: Yes  Individual responsible for coordination of the follow-up plan: NANCY-WIFE  Confirmed correct DME delivered: Elease Hashimoto 08/02/2017    Elease Hashimoto

## 2017-08-02 NOTE — Progress Notes (Signed)
Occupational Therapy Discharge Summary  Patient Details  Name: Arthur [name] MRN: [id number] Date of Birth: 09-11-1928  Patient has met 6 of 11 long term goals due to improved activity tolerance, improved balance, postural control, ability to compensate for deficits, functional use of  LEFT upper and LEFT lower extremity, improved attention, improved awareness and improved coordination.  Patient to discharge at [facility] Max Assist level.  Patient's care partner is unable to provide the necessary physical and cognitive assistance at discharge.  Reasons goals not met: Goals not met 2/2 shortened LOS due to insurance approval for SNF.  Recommendation:  Patient will benefit from ongoing skilled OT services in skilled nursing facility setting to continue to advance functional skills in the area of Reduce care partner burden.  Equipment: No equipment provided  Reasons for discharge: discharge from hospital  Patient/family agrees with progress made and goals achieved: Yes  OT Discharge Precautions/Restrictions  Precautions Precautions: Fall Precaution Comments: dense L hemi, L inattention, pusher syndrome Restrictions Weight Bearing Restrictions: No ADL ADL ADL Comments: Please see functional navigator ]Perception  Perception: Impaired Inattention/Neglect: Does not attend to left side of body Praxis Praxis: Impaired Praxis Impairment Details: Initiation;Motor planning Cognition Overall Cognitive Status: Impaired/Different from baseline Arousal/Alertness: Awake/alert Attention: Sustained Awareness: Impaired Problem Solving: Impaired Safety/Judgment: Impaired Sensation Sensation Light Touch: Impaired Detail Light Touch Impaired Details: Absent LLE;Absent LUE Stereognosis: Impaired by gross assessment Hot/Cold: Impaired by gross assessment Proprioception: Impaired by gross assessment Additional Comments: LUE/LLE absent sensation Coordination Gross Motor Movements are Fluid  and Coordinated: No Fine Motor Movements are Fluid and Coordinated: No Coordination and Movement Description: LUE/LLE minimql active movement with clonus/hypertone Motor  Motor Motor: Hemiplegia;Abnormal postural alignment and control;Abnormal tone Motor - Discharge Observations: improved midline orientation and sitting balance, limited by L UE and LLE tone Mobility  Bed Mobility Bed Mobility: Rolling Left;Rolling Right;Sit to Supine;Supine to Sit Rolling Right: 3: Mod assist Rolling Left: 4: Min assist Supine to Sit: 2: Max assist;HOB elevated;With rails Sit to Supine: 3: Mod assist;With rail Transfers Sit to Stand: 1: +2 Total assist  Trunk/Postural Assessment  Postural Control Postural Control: Deficits on evaluation  Balance Balance Balance Assessed: Yes Static Sitting Balance Static Sitting - Balance Support: Feet supported;No upper extremity supported Static Sitting - Level of Assistance: 5: Stand by assistance;4: Min assist Dynamic Sitting Balance Dynamic Sitting - Balance Support: Feet supported;No upper extremity supported Dynamic Sitting - Level of Assistance: 3: Mod assist Sitting balance - Comments: L Lat lean Static Standing Balance Static Standing - Level of Assistance: 1: +2 Total assist Extremity/Trunk Assessment RUE Assessment RUE Assessment: Exceptions to [facility] RUE AROM (degrees) Right Shoulder Flexion: 100 Degrees RUE Strength RUE Overall Strength Comments: 4/5 throughout except shoulder LUE Assessment LUE Assessment: Exceptions to WFL LUE AROM (degrees) Overall AROM Left Upper Extremity: Deficits LUE Overall AROM Comments: slight activation within flexor synergies LUE Tone LUE Tone: Hypertonic Hypertonic Details: flexor synergy patterns, clonus when extending elbow   See Function Navigator for Current Functional Status.  Daneen Schick Christipher Rieger 08/02/2017, 8:15 PM

## 2017-08-02 NOTE — Progress Notes (Signed)
Subjective/Complaints:  No issues overnight patient eating well, intake up to 360 mL yesterday  ROS: pt denies nausea, vomiting, diarrhea, cough, shortness of breath or chest pain   Objective:  Vital Signs: Blood pressure 134/85, pulse 78, temperature 98.1 F (36.7 C), temperature source Oral, resp. rate 12, height 5' 7"  (1.702 m), weight 80 kg (176 lb 5.9 oz), SpO2 98 %.  Results for orders placed or performed during the hospital encounter of 07/15/17 (from the past 72 hour(s))  Basic metabolic panel     Status: Abnormal   Collection Time: 08/01/17  4:50 AM  Result Value Ref Range   Sodium 142 135 - 145 mmol/L   Potassium 4.0 3.5 - 5.1 mmol/L   Chloride 108 101 - 111 mmol/L   CO2 25 22 - 32 mmol/L   Glucose, Bld 96 65 - 99 mg/dL   BUN 24 (H) 6 - 20 mg/dL   Creatinine, Ser 1.18 0.61 - 1.24 mg/dL   Calcium 9.9 8.9 - 10.3 mg/dL   GFR calc non Af Amer 54 (L) >60 mL/min   GFR calc Af Amer >60 >60 mL/min    Comment: (NOTE) The eGFR has been calculated using the CKD EPI equation. This calculation has not been validated in all clinical situations. eGFR's persistently <60 mL/min signify possible Chronic Kidney Disease.    Anion gap 9 5 - 15    Comment: Performed at [facility] [address] [place] [address]  CBC     Status: None   Collection Time: 08/01/17  4:50 AM  Result Value Ref Range   WBC 7.0 4.0 - 10.5 K/uL   RBC 5.02 4.22 - 5.81 MIL/uL   Hemoglobin 14.9 13.0 - 17.0 g/dL   HCT 46.3 39.0 - 52.0 %   MCV 92.2 78.0 - 100.0 fL   MCH 29.7 26.0 - 34.0 pg   MCHC 32.2 30.0 - 36.0 g/dL   RDW 14.0 11.5 - 15.5 %   Platelets 321 150 - 400 K/uL    Comment: Performed at [facility], [place] [address] [place], [place] [address]     HEENT:   Normocephalic. Atraumatic. Cardio: RRR without murmur. No JVD  . Resp:CTA Bilaterally without wheezes or rales. Normal effort  GI: BS positive and ND Musculoskeletal:  No Edema and no tenderness Skin:   Other intact.  Warm and dry. Neuro: Alert/Oriented Motor LUE: 1/5 shoulder abduction, 2- left elbow flexor, distally 0/5 LLE: 2-/5 proximal to distal  Right low extremity 3/5 proximal to distal Increased tone left upper extremity at elbow flexors, mAS 1-2--motor and sensory exam unchanged Gen NAD. Vital signs reviewed.  Assessment/Plan: 1. 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 Physiatrist is providing close team supervision and 24 hour management of active medical problems listed below. Physiatrist and rehab team continue to assess barriers to discharge/monitor patient progress toward functional and medical goals. FIM: Function - Bathing Position: Bed Body parts bathed by patient: Chest, Abdomen, Right upper leg Body parts bathed by helper: Right arm, Left arm, Front perineal area, Buttocks, Back, Left lower leg, Right lower leg, Left upper leg, Chest, Abdomen, Right upper leg Assist Level: Touching or steadying assistance(Pt > 75%)  Function- Upper Body Dressing/Undressing What is the patient wearing?: Pull over shirt/dress Pull over shirt/dress - Perfomed by patient: Thread/unthread  right sleeve Pull over shirt/dress - Perfomed by helper: Thread/unthread left sleeve, Put head through opening, Pull shirt over trunk Button up shirt - Perfomed by patient: Thread/unthread right sleeve Button up shirt - Perfomed by helper: Thread/unthread left sleeve, Pull shirt around back, Button/unbutton shirt Assist Level: 2 helpers Function - Lower Body Dressing/Undressing Lower body dressing/undressing activity did not occur: N/A What is the patient wearing?: Socks Position: Bed Pants- Performed by helper: Thread/unthread left pants leg, Pull pants up/down, Thread/unthread right pants leg Non-skid slipper socks-  Performed by helper: Don/doff left sock, Don/doff right sock Socks - Performed by helper: Don/doff right sock, Don/doff left sock Shoes - Performed by helper: Don/doff left shoe, Don/doff right shoe Assist for footwear: Dependant Assist for lower body dressing: 2 Helpers  Function - Toileting Toileting activity did not occur: No continent bowel/bladder event Toileting steps completed by helper: Adjust clothing prior to toileting, Performs perineal hygiene, Adjust clothing after toileting Assist level: Two helpers  Function - Air cabin crew transfer activity did not occur: Safety/medical concerns Toilet transfer assistive device: Mechanical lift Assist level to toilet: 2 helpers(per Candice, NT report)  Function - Chair/bed transfer Chair/bed transfer method: Lateral scoot Chair/bed transfer assist level: 2 helpers Chair/bed transfer assistive device: Sliding board, Armrests Mechanical lift: Clarise Cruz Chair/bed transfer details: Manual facilitation for weight shifting, Manual facilitation for weight bearing, Manual facilitation for placement, Verbal cues for technique, Verbal cues for sequencing  Function - Locomotion: Wheelchair Will patient use wheelchair at discharge?: Yes Type: Manual Wheelchair activity did not occur: Safety/medical concerns Assist Level: Dependent (Pt equals 0%)(TIS) Wheel 50 feet with 2 turns activity did not occur: Safety/medical concerns Wheel 150 feet activity did not occur: Safety/medical concerns Function - Locomotion: Ambulation Ambulation activity did not occur: Safety/medical concerns Walk 10 feet activity did not occur: Safety/medical concerns Walk 50 feet with 2 turns activity did not occur: Safety/medical concerns Walk 150 feet activity did not occur: Safety/medical concerns Walk 10 feet on uneven surfaces activity did not occur: Safety/medical concerns  Function - Comprehension Comprehension: Auditory Comprehension assist level:  Understands basic 90% of the time/cues < 10% of the time  Function - Expression Expression: Verbal Expression assist level: Expresses basic 90% of the time/requires cueing < 10% of the time.  Function - Social Interaction Social Interaction assist level: Interacts appropriately 90% of the time - Needs monitoring or encouragement for participation or interaction.  Function - Problem Solving Problem solving assist level: Solves basic 90% of the time/requires cueing < 10% of the time  Function - Memory Memory assist level: Recognizes or recalls 90% of the time/requires cueing < 10% of the time Patient normally able to recall (first 3 days only): Current season, Staff names and faces, That he or she is in a hospital  Medical Problem List and Plan:  1. Functional deficits and left hemiparesis secondary to right MCA infarct, small right ACA infarct, posterior watershed infarct due to cardio-embolic source    Cont CIR PT, OT, SLP-progressing with swallow2. DVT Prophylaxis/Anticoagulation: Pharmaceutical: eliquis no clinical evidence of DVT   3. Pain Management: tylenol prn    -Add low-dose baclofen for tone increase.  Observe closely for tolerance 4. Mood: LCSW to follow for evaluation and support  5. Neuropsych: This patient is not capable of making decisions on his own behalf.  6. Skin/Wound Care: routine pressure relief measures  7. Fluids/Electrolytes/Nutrition: monitor I/O. Offering liquids between meals to maintain adequate hydration.     -.  Eating quite well now.  Continue to push fluid intake  8. CAD s/p Maze/ Chronic A fib: Monitor HR bid. Eliquis   9. FUO: resolved 10 GERD: Managed with Protonix  11. CKD stage II: Baseline SCr 1.2.    Creatinine 1.18 on 08/01/2017   Encourage fluids   Continue to monitor 12. HTN: Monitor BP bid--Home medsCoreg, Lotensin, Amlodipine  Vitals:   08/01/17 1524 08/02/17 0720  BP: 92/70 134/85  Pulse: 85 78  Resp: 14 12  Temp: 97.9 F (36.6 C)  98.1 F (36.7 C)  SpO2: 98% 98%  Resumed amlodipine 84m 2/5, increased to 177m2/8, improved with  Lotensin, controlled 08/01/2017   Improv improved control overall.  Avoid overtreatment 13. Irritation left eyJBH:[id number]  ciloxan gtts, tx x 1 wk, d/ced 2/11   Currently patched 14. Hypophosphatemia   Potassium 4.2 on 07/25/2017   Supplement decreased to daily on 2/9  15 Leukocytosis  WBCs 10.6 on 2/11   Afebrile   Cont to monitor   LOS (Days) 18 A FACE TO FACE EVALUATION WAS PERFORMED  AnLuanna Salkirsteins 08/02/2017, 10:00 AM

## 2017-08-02 NOTE — Progress Notes (Signed)
Social Work Patient ID: Arthur [name], male   DOB: Apr 07, 1929, 84 y.o.   MRN: [id number] Spoke with [facility] who has approved pt for NH at [facility]. Kelly-admission coordinator to be here to com[plete paperwork with wife at 94. Contacted wife and pt is aware now and in agreement with the plan and aware of the move tomorrow. Team aware plan for transfer tomorrow.

## 2017-08-02 NOTE — Progress Notes (Signed)
Physical Therapy Discharge Summary  Patient Details  Name: Arthur [name] MRN: [id number] Date of Birth: 06/09/1929  Today's Date: 08/02/2017 PT Individual Time: 1300-1415 PT Individual Time Calculation (min): 75 min    Patient has met 0 of 7 long term goals, however does demonstrate improved activity tolerance, improved balance, improved postural control, decreased pain, ability to compensate for deficits, improved attention and improved awareness.  Patient to discharge at a wheelchair level Max Assist.   Patient's care partner unavailable to provide the necessary physical assistance at discharge.  Pt to d/c to SNF for further rehab per insurance request.  Pt making steady progress with therapy in inpatient setting, currently max +1 for transfers with squat/pivot to R and L, and demonstrates static sitting balance with supervision<>min guard.  Pt with significantly decreased pushing in sitting.  Pt progressing towards standing with therapy, however continues to demonstrate strong RLE push in standing which bothers his R knee.  Does better when RLE blocked at ankle and knee laterally with assist for L weight shift and facilitation for L weight bearing.   Reasons goals not met: Pt's LOS shortened per insurance request.   Recommendation:  Patient will benefit from ongoing skilled PT services in skilled nursing facility setting to continue to advance safe functional mobility, address ongoing impairments in postural control, midline orientation, balance, ROM, motor control, coordination, and activity tolerance, and minimize fall risk.  Equipment: No equipment provided  Reasons for discharge: discharge from hospital, insurance request to transfer to SNF  Patient/family agrees with progress made and goals achieved: No   Skilled PT Intervention: C/o Soreness in L hip with sitting.  PT adjusted pt's position in w/c and adjusted inflation of ROHO portion of cushion with improvement.  Session focus on  sitting balance and transfers and d/c education.  PT provided education to wife and pt on current level of function and expected progression at SNF level of care.  Discussed improvement in pushing in sitting, and hoping for progression towards standing while at SNF.  Pt and wife were both hoping to stay here through full estimated LOS.  Pt transfers from w/c to therapy mat on L with max assist +1, with multimodal cues for forward weight shift, head/hips relationship, and maintaining R elbow flexion for improved lateral translation of hips versus shoulders.  Sitting balance edge of mat x15 minutes with range from supervision to min guard with verbal cues for midline orientation.  Attempted sit<>stand x2 with +2 in 3 musketeers style, pt with strong LLE tone requiring facilitation for weight bearing and positioning as well as L weight shift, and assist for blocking RLE laterally for decreased pushing.  Unable to bring trunk into full extension.  Pt transitioned back to w/c on R with max assist.  Returned to room and positioned upright in tilt in space with call bell in reach and needs met.   PT Discharge Precautions/Restrictions Precautions Precautions: Fall Precaution Comments: dense L hemi, L inattention, pushes L in standing Restrictions Weight Bearing Restrictions: No Pain Pain Assessment Pain Assessment: No/denies pain Vision/Perception  Perception Perception: Impaired Inattention/Neglect: Does not attend to right side of body Praxis Praxis: Impaired Praxis Impairment Details: Initiation;Motor planning  Cognition Overall Cognitive Status: Impaired/Different from baseline Arousal/Alertness: Awake/alert Orientation Level: Oriented X4 Attention: Sustained Sustained Attention: Impaired Awareness: Impaired Awareness Impairment: Emergent impairment;Anticipatory impairment Problem Solving: Impaired Behaviors: Lability Safety/Judgment: Impaired Sensation Sensation Light Touch: Appears  Intact Proprioception: Impaired by gross assessment Coordination Gross Motor Movements are Fluid and Coordinated: No  Fine Motor Movements are Fluid and Coordinated: No Motor  Motor Motor: Hemiplegia;Abnormal postural alignment and control;Abnormal tone Motor - Discharge Observations: improving midline orientation in sitting, ongoing tone in LUE/LLE  Mobility Bed Mobility Bed Mobility: Rolling Left;Rolling Right;Sit to Supine;Supine to Sit Rolling Right: 3: Mod assist Rolling Left: 4: Min assist Supine to Sit: 2: Max assist;HOB elevated;With rails Sit to Supine: 3: Mod assist;With rail Transfers Transfers: Yes Sit to Stand: 1: +2 Total assist Squat Pivot Transfers: 2: Max assist Locomotion  Ambulation Ambulation: No Stairs / Additional Locomotion Stairs: No Wheelchair Mobility Wheelchair Mobility: No  Trunk/Postural Assessment  Cervical Assessment Cervical Assessment: (rotated R ) Thoracic Assessment Thoracic Assessment: (rotated R anteriorly, rounded shoulders, kyphosis) Lumbar Assessment Lumbar Assessment: (preference for posterior pelvic tilt but can correct) Postural Control Postural Control: Deficits on evaluation Righting Reactions: delayed and insufficient Protective Responses: delayed and insufficient  Balance Balance Balance Assessed: Yes Static Sitting Balance Static Sitting - Balance Support: Feet supported;No upper extremity supported Static Sitting - Level of Assistance: 5: Stand by assistance;4: Min assist Dynamic Sitting Balance Dynamic Sitting - Balance Support: Feet supported;No upper extremity supported Dynamic Sitting - Level of Assistance: 3: Mod assist Static Standing Balance Static Standing - Level of Assistance: 1: +2 Total assist Extremity Assessment      RLE Strength RLE Overall Strength Comments: grossly 3-/5 LLE Assessment LLE Assessment: Exceptions to [facility] LLE Strength LLE Overall Strength: Deficits LLE Overall Strength Comments:  increased tone, able to elicit active adduction, hamstring, glute, and flexion at hip   See Function Navigator for Current Functional Status.  Michel Santee 08/02/2017, 2:24 PM

## 2017-08-03 DIAGNOSIS — M6281 Muscle weakness (generalized): Secondary | ICD-10-CM | POA: Diagnosis not present

## 2017-08-03 DIAGNOSIS — I63521 Cerebral infarction due to unspecified occlusion or stenosis of right anterior cerebral artery: Secondary | ICD-10-CM | POA: Diagnosis not present

## 2017-08-03 DIAGNOSIS — E871 Hypo-osmolality and hyponatremia: Secondary | ICD-10-CM | POA: Diagnosis not present

## 2017-08-03 DIAGNOSIS — H6121 Impacted cerumen, right ear: Secondary | ICD-10-CM | POA: Diagnosis not present

## 2017-08-03 DIAGNOSIS — D500 Iron deficiency anemia secondary to blood loss (chronic): Secondary | ICD-10-CM | POA: Diagnosis not present

## 2017-08-03 DIAGNOSIS — R2689 Other abnormalities of gait and mobility: Secondary | ICD-10-CM | POA: Diagnosis not present

## 2017-08-03 DIAGNOSIS — L603 Nail dystrophy: Secondary | ICD-10-CM | POA: Diagnosis not present

## 2017-08-03 DIAGNOSIS — K219 Gastro-esophageal reflux disease without esophagitis: Secondary | ICD-10-CM | POA: Diagnosis not present

## 2017-08-03 DIAGNOSIS — W19XXXD Unspecified fall, subsequent encounter: Secondary | ICD-10-CM | POA: Diagnosis not present

## 2017-08-03 DIAGNOSIS — I69354 Hemiplegia and hemiparesis following cerebral infarction affecting left non-dominant side: Secondary | ICD-10-CM | POA: Diagnosis not present

## 2017-08-03 DIAGNOSIS — R52 Pain, unspecified: Secondary | ICD-10-CM | POA: Diagnosis not present

## 2017-08-03 DIAGNOSIS — B351 Tinea unguium: Secondary | ICD-10-CM | POA: Diagnosis not present

## 2017-08-03 DIAGNOSIS — R41841 Cognitive communication deficit: Secondary | ICD-10-CM | POA: Diagnosis not present

## 2017-08-03 DIAGNOSIS — R2681 Unsteadiness on feet: Secondary | ICD-10-CM | POA: Diagnosis not present

## 2017-08-03 DIAGNOSIS — M62838 Other muscle spasm: Secondary | ICD-10-CM | POA: Diagnosis not present

## 2017-08-03 DIAGNOSIS — Q845 Enlarged and hypertrophic nails: Secondary | ICD-10-CM | POA: Diagnosis not present

## 2017-08-03 DIAGNOSIS — F4322 Adjustment disorder with anxiety: Secondary | ICD-10-CM | POA: Diagnosis not present

## 2017-08-03 DIAGNOSIS — I69352 Hemiplegia and hemiparesis following cerebral infarction affecting left dominant side: Secondary | ICD-10-CM | POA: Diagnosis not present

## 2017-08-03 DIAGNOSIS — I4891 Unspecified atrial fibrillation: Secondary | ICD-10-CM | POA: Diagnosis not present

## 2017-08-03 DIAGNOSIS — K5900 Constipation, unspecified: Secondary | ICD-10-CM | POA: Diagnosis not present

## 2017-08-03 DIAGNOSIS — G8114 Spastic hemiplegia affecting left nondominant side: Secondary | ICD-10-CM | POA: Diagnosis not present

## 2017-08-03 DIAGNOSIS — N183 Chronic kidney disease, stage 3 (moderate): Secondary | ICD-10-CM | POA: Diagnosis not present

## 2017-08-03 DIAGNOSIS — I69392 Facial weakness following cerebral infarction: Secondary | ICD-10-CM | POA: Diagnosis not present

## 2017-08-03 DIAGNOSIS — R1310 Dysphagia, unspecified: Secondary | ICD-10-CM | POA: Diagnosis not present

## 2017-08-03 DIAGNOSIS — H5712 Ocular pain, left eye: Secondary | ICD-10-CM | POA: Diagnosis not present

## 2017-08-03 DIAGNOSIS — R278 Other lack of coordination: Secondary | ICD-10-CM | POA: Diagnosis not present

## 2017-08-03 DIAGNOSIS — J9600 Acute respiratory failure, unspecified whether with hypoxia or hypercapnia: Secondary | ICD-10-CM | POA: Diagnosis not present

## 2017-08-03 DIAGNOSIS — L989 Disorder of the skin and subcutaneous tissue, unspecified: Secondary | ICD-10-CM | POA: Diagnosis not present

## 2017-08-03 DIAGNOSIS — R410 Disorientation, unspecified: Secondary | ICD-10-CM | POA: Diagnosis not present

## 2017-08-03 DIAGNOSIS — D72829 Elevated white blood cell count, unspecified: Secondary | ICD-10-CM | POA: Diagnosis not present

## 2017-08-03 DIAGNOSIS — L02225 Furuncle of perineum: Secondary | ICD-10-CM | POA: Diagnosis not present

## 2017-08-03 DIAGNOSIS — Z043 Encounter for examination and observation following other accident: Secondary | ICD-10-CM | POA: Diagnosis not present

## 2017-08-03 DIAGNOSIS — Z23 Encounter for immunization: Secondary | ICD-10-CM | POA: Diagnosis not present

## 2017-08-03 DIAGNOSIS — R451 Restlessness and agitation: Secondary | ICD-10-CM | POA: Diagnosis not present

## 2017-08-03 DIAGNOSIS — Z111 Encounter for screening for respiratory tuberculosis: Secondary | ICD-10-CM | POA: Diagnosis not present

## 2017-08-03 DIAGNOSIS — E785 Hyperlipidemia, unspecified: Secondary | ICD-10-CM | POA: Diagnosis not present

## 2017-08-03 DIAGNOSIS — H04129 Dry eye syndrome of unspecified lacrimal gland: Secondary | ICD-10-CM | POA: Diagnosis not present

## 2017-08-03 DIAGNOSIS — F419 Anxiety disorder, unspecified: Secondary | ICD-10-CM | POA: Diagnosis not present

## 2017-08-03 DIAGNOSIS — B379 Candidiasis, unspecified: Secondary | ICD-10-CM | POA: Diagnosis not present

## 2017-08-03 DIAGNOSIS — I10 Essential (primary) hypertension: Secondary | ICD-10-CM | POA: Diagnosis not present

## 2017-08-03 DIAGNOSIS — W19XXXA Unspecified fall, initial encounter: Secondary | ICD-10-CM | POA: Diagnosis not present

## 2017-08-03 DIAGNOSIS — R1312 Dysphagia, oropharyngeal phase: Secondary | ICD-10-CM | POA: Diagnosis not present

## 2017-08-03 DIAGNOSIS — I739 Peripheral vascular disease, unspecified: Secondary | ICD-10-CM | POA: Diagnosis not present

## 2017-08-03 DIAGNOSIS — R600 Localized edema: Secondary | ICD-10-CM | POA: Diagnosis not present

## 2017-08-03 DIAGNOSIS — R05 Cough: Secondary | ICD-10-CM | POA: Diagnosis not present

## 2017-08-03 MED ORDER — K PHOS MONO-SOD PHOS DI & MONO 155-852-130 MG PO TABS: 250.0000 mg | ORAL_TABLET | Freq: Every day | ORAL | Status: DC

## 2017-08-03 MED ORDER — DICLOFENAC SODIUM 1 % TD GEL: 2.0000 g | Freq: Four times a day (QID) | TRANSDERMAL | Status: DC

## 2017-08-03 MED ORDER — ARTIFICIAL TEARS OPHTHALMIC OINT
TOPICAL_OINTMENT | Freq: Every day | OPHTHALMIC | Status: DC
Start: 2017-08-03 — End: 2018-11-20

## 2017-08-03 MED ORDER — PANTOPRAZOLE SODIUM 40 MG PO TBEC: 40.0000 mg | DELAYED_RELEASE_TABLET | Freq: Every day | ORAL | Status: DC

## 2017-08-03 MED ORDER — AMLODIPINE BESYLATE 10 MG PO TABS: 10.0000 mg | ORAL_TABLET | Freq: Every day | ORAL | Status: DC

## 2017-08-03 MED ORDER — APIXABAN 5 MG PO TABS: 5.0000 mg | ORAL_TABLET | Freq: Two times a day (BID) | ORAL | Status: DC

## 2017-08-03 MED ORDER — BACLOFEN 5 MG PO TABS: 5.0000 mg | ORAL_TABLET | Freq: Two times a day (BID) | ORAL | Status: DC

## 2017-08-03 MED ORDER — POLYVINYL ALCOHOL 1.4 % OP SOLN: 1.0000 [drp] | Freq: Three times a day (TID) | OPHTHALMIC | 0 refills | Status: DC

## 2017-08-03 MED ORDER — BENAZEPRIL HCL 5 MG PO TABS: 5.0000 mg | ORAL_TABLET | Freq: Every day | ORAL | Status: DC

## 2017-08-03 NOTE — Progress Notes (Signed)
Subjective/Complaints:  No new issues   ROS: pt denies nausea, vomiting, diarrhea, cough, shortness of breath or chest pain   Objective:  Vital Signs: Blood pressure 101/74, pulse 84, temperature 97.6 F (36.4 C), temperature source Axillary, resp. rate 18, height _0  (1.702 m), weight 80 kg (176 lb 5.9 oz), SpO2 99 %.  Results for orders placed or performed during the hospital encounter of 07/15/17 (from the past 72 hour(s))  Basic metabolic panel     Status: Abnormal   Collection Time: 08/01/17  4:50 AM  Result Value Ref Range   Sodium 142 135 - 145 mmol/L   Potassium 4.0 3.5 - 5.1 mmol/L   Chloride 108 101 - 111 mmol/L   CO2 25 22 - 32 mmol/L   Glucose, Bld 96 65 - 99 mg/dL   BUN 24 (H) 6 - 20 mg/dL   Creatinine, Ser 1.18 0.61 - 1.24 mg/dL   Calcium 9.9 8.9 - 10.3 mg/dL   GFR calc non Af Amer 54 (L) >60 mL/min   GFR calc Af Amer >60 >60 mL/min    Comment: (NOTE) The eGFR has been calculated using the CKD EPI equation. This calculation has not been validated in all clinical situations. eGFR's persistently <60 mL/min signify possible Chronic Kidney Disease.    Anion gap 9 5 - 15    Comment: Performed at [facility] [address], [place] [address]  CBC     Status: None   Collection Time: 08/01/17  4:50 AM  Result Value Ref Range   WBC 7.0 4.0 - 10.5 K/uL   RBC 5.02 4.22 - 5.81 MIL/uL   Hemoglobin 14.9 13.0 - 17.0 g/dL   HCT 46.3 39.0 - 52.0 %   MCV 92.2 78.0 - 100.0 fL   MCH 29.7 26.0 - 34.0 pg   MCHC 32.2 30.0 - 36.0 g/dL   RDW 14.0 11.5 - 15.5 %   Platelets 321 150 - 400 K/uL    Comment: Performed at [facility], [place] [address] [place], [place] [address]     HEENT:   Normocephalic. Atraumatic. Cardio: RRR without murmur. No JVD  . Resp:CTA Bilaterally without wheezes or rales. Normal effort  GI: BS positive and ND Musculoskeletal:  No Edema and no tenderness Skin:   Other intact. Warm and dry. Neuro: Alert/Oriented Motor LUE: 1/5  shoulder abduction, 2- left elbow flexor, distally 0/5 LLE: 2-/5 proximal to distal  Right low extremity 3/5 proximal to distal Increased tone left upper extremity at elbow flexors, mAS 1-2--motor and sensory exam unchanged Gen NAD. Vital signs reviewed.  Assessment/Plan: 1. Functional deficits secondary to right MCA infarct, small right ACA infarct, posterior watershed infarct due to cardio-embolic source  Stable for D/C today F/u PCP in 3-4 weeks F/u PM&R 2 weeks See D/C summary See D/C instructions   FIM: Function - Bathing Position: Bed Body parts bathed by patient: Chest, Abdomen, Right upper leg, Left arm, Right arm, Left upper leg Body parts bathed by helper: Left lower leg, Right lower leg, Buttocks, Front perineal area Assist Level: Touching or steadying assistance(Pt > 75%)  Function- Upper Body Dressing/Undressing What is the patient wearing?: Pull over shirt/dress Pull over shirt/dress - Perfomed by patient: Thread/unthread right sleeve, Put head through opening Pull over shirt/dress - Perfomed by helper: Thread/unthread left sleeve, Pull shirt over trunk Button up shirt - Perfomed by patient: Thread/unthread right sleeve Button up shirt - Perfomed by helper: Thread/unthread left sleeve, Pull shirt around  back, Button/unbutton shirt Assist Level: Touching or steadying assistance(Pt > 75%) Function - Lower Body Dressing/Undressing Lower body dressing/undressing activity did not occur: N/A What is the patient wearing?: Pants, Non-skid slipper socks Position: Sitting EOB(and in bed) Pants- Performed by helper: Thread/unthread right pants leg, Thread/unthread left pants leg, Pull pants up/down Non-skid slipper socks- Performed by helper: Don/doff left sock, Don/doff right sock Socks - Performed by helper: Don/doff right sock, Don/doff left sock Shoes - Performed by helper: Don/doff right shoe, Don/doff left shoe Assist for footwear: Dependant Assist for lower body  dressing: Touching or steadying assistance (Pt > 75%)  Function - Toileting Toileting activity did not occur: No continent bowel/bladder event Toileting steps completed by helper: Adjust clothing prior to toileting, Performs perineal hygiene, Adjust clothing after toileting Assist level: Two helpers  Function - Air cabin crew transfer activity did not occur: Safety/medical concerns Toilet transfer assistive device: Bedside commode, Drop arm commode Assist level to toilet: 2 helpers(per Candice, NT report) Assist level to bedside commode (at bedside): Maximal assist (Pt 25 - 49%/lift and lower) Assist level from bedside commode (at bedside): Maximal assist (Pt 25 - 49%/lift and lower)  Function - Chair/bed transfer Chair/bed transfer method: Squat pivot Chair/bed transfer assist level: Maximal assist (Pt 25 - 49%/lift and lower) Chair/bed transfer assistive device: Armrests Mechanical lift: Clarise Cruz Chair/bed transfer details: Manual facilitation for weight shifting, Manual facilitation for weight bearing, Manual facilitation for placement, Verbal cues for technique, Verbal cues for sequencing  Function - Locomotion: Wheelchair Will patient use wheelchair at discharge?: Yes Type: Manual Wheelchair activity did not occur: Safety/medical concerns Assist Level: Dependent (Pt equals 0%) Wheel 50 feet with 2 turns activity did not occur: Safety/medical concerns Assist Level: Dependent (Pt equals 0%) Wheel 150 feet activity did not occur: Safety/medical concerns Assist Level: Dependent (Pt equals 0%) Turns around,maneuvers to table,bed, and toilet,negotiates 3% grade,maneuvers on rugs and over doorsills: No Function - Locomotion: Ambulation Ambulation activity did not occur: Safety/medical concerns Walk 10 feet activity did not occur: Safety/medical concerns Walk 50 feet with 2 turns activity did not occur: Safety/medical concerns Walk 150 feet activity did not occur: Safety/medical  concerns Walk 10 feet on uneven surfaces activity did not occur: Safety/medical concerns  Function - Comprehension Comprehension: Auditory Comprehension assist level: Understands basic 90% of the time/cues < 10% of the time  Function - Expression Expression: Verbal Expression assist level: Expresses basic 90% of the time/requires cueing < 10% of the time.  Function - Social Interaction Social Interaction assist level: Interacts appropriately 90% of the time - Needs monitoring or encouragement for participation or interaction.  Function - Problem Solving Problem solving assist level: Solves basic 90% of the time/requires cueing < 10% of the time  Function - Memory Memory assist level: Recognizes or recalls 90% of the time/requires cueing < 10% of the time Patient normally able to recall (first 3 days only): Current season, Staff names and faces, That he or she is in a hospital  Medical Problem List and Plan:  1. Functional deficits and left hemiparesis secondary to right MCA infarct, small right ACA infarct, posterior watershed infarct due to cardio-embolic source    Cont CIR PT, OT, SLP-progressing with swallow, to SNF2. DVT Prophylaxis/Anticoagulation: Pharmaceutical: eliquis no clinical evidence of DVT   3. Pain Management: tylenol prn    -Add low-dose baclofen for tone increase.  Observe closely for tolerance 4. Mood: LCSW to follow for evaluation and support  5. Neuropsych: This patient is not capable of  making decisions on his own behalf.  6. Skin/Wound Care: routine pressure relief measures  7. Fluids/Electrolytes/Nutrition: monitor I/O. Offering liquids between meals to maintain adequate hydration.     -.  Eating quite well now.  Continue to push fluid intake  8. CAD s/p Maze/ Chronic A fib: Monitor HR bid. Eliquis   9. FUO: resolved 10 GERD: Managed with Protonix  11. CKD stage II: Baseline SCr 1.2.    Creatinine 1.18 on 08/01/2017   Encourage fluids   Continue to  monitor 12. HTN: Monitor BP bid--Home medsCoreg, Lotensin, Amlodipine  Vitals:   08/03/17 0033 08/03/17 0947  BP: 140/90 101/74  Pulse: 75 84  Resp: 16 18  Temp: 98.9 F (37.2 C) 97.6 F (36.4 C)  SpO2: 98% 99%  Resumed amlodipine 61m 2/5, increased to 168m2/8, improved with  Lotensin, controlled 08/03/2017   Improv improved control overall.  Avoid overtreatment 13. Irritation left eyNOB:SJGGEZMOith  ciloxan gtts, tx x 1 wk, d/ced 2/11   Currently patched 14. Hypophosphatemia   Potassium 4.2 on 07/25/2017   Supplement decreased to daily on 2/9  15 Leukocytosis  WBCs 10.6 on 2/11   Afebrile   Cont to monitor   LOS (Days) 19 A FACE TO FACE EVALUATION WAS PERFORMED  AnCharlett Blake/20/2019, 9:53 AM

## 2017-08-03 NOTE — Progress Notes (Signed)
Speech Language Pathology Discharge Summary  Patient Details  Name: Arthur [name] MRN: [id number] Date of Birth: Jan 06, 1929   Patient has met 5 of 6 long term goals.  Patient to discharge at overall Supervision;Min level.   Reasons goals not met: Patient had a shorter length of stay than planned due to insurance    Clinical Impression/Discharge Summary: Patient has made functional gains and has met 5 of 6 LTG's this admission. Currently, patient is consuming Dys. 2 textures with thin liquids without overt s/s of aspiration and requires supervision verbal cues for use of swallowing compensatory strategies that include a cough after thin liquids and to clear left buccal pocketing. Patient also requires supervision verbal cues for use of speech intelligibility strategies to achieve ~90-100% intelligibility at the sentence level.  Patient continues to demonstrate mild cognitive impairments that impact problem solving and attention left field of environment and requires overall Min A and extra time to complete functional and familiar tasks safely. Patient's family is unable to provide the necessary assistance needed at this time, therefore, patient will d/c to a SNF. Patient would benefit from f/u SLP services to maximize his cognitive and swallowing function as well as his speech intelligibility to maximize his overall functional independence.   Care Partner:  Caregiver Able to Provide Assistance: No  Type of Caregiver Assistance: Physical;Cognitive  Recommendation:  Skilled Nursing facility  Rationale for SLP Follow Up: Reduce caregiver burden;Maximize functional communication;Maximize cognitive function and independence;Maximize swallowing safety   Equipment: N/A   Reasons for discharge: Discharged from hospital   Patient/Family Agrees with Progress Made and Goals Achieved: Yes     Mark Benecke 08/03/2017, 7:09 AM

## 2017-08-04 DIAGNOSIS — I4891 Unspecified atrial fibrillation: Secondary | ICD-10-CM | POA: Diagnosis not present

## 2017-08-04 DIAGNOSIS — M62838 Other muscle spasm: Secondary | ICD-10-CM | POA: Diagnosis not present

## 2017-08-04 DIAGNOSIS — D500 Iron deficiency anemia secondary to blood loss (chronic): Secondary | ICD-10-CM | POA: Diagnosis not present

## 2017-08-04 DIAGNOSIS — H04129 Dry eye syndrome of unspecified lacrimal gland: Secondary | ICD-10-CM | POA: Diagnosis not present

## 2017-08-04 DIAGNOSIS — N183 Chronic kidney disease, stage 3 (moderate): Secondary | ICD-10-CM | POA: Diagnosis not present

## 2017-08-04 DIAGNOSIS — M6281 Muscle weakness (generalized): Secondary | ICD-10-CM | POA: Diagnosis not present

## 2017-08-04 DIAGNOSIS — I69352 Hemiplegia and hemiparesis following cerebral infarction affecting left dominant side: Secondary | ICD-10-CM | POA: Diagnosis not present

## 2017-08-04 DIAGNOSIS — R52 Pain, unspecified: Secondary | ICD-10-CM | POA: Diagnosis not present

## 2017-08-04 DIAGNOSIS — I69392 Facial weakness following cerebral infarction: Secondary | ICD-10-CM | POA: Diagnosis not present

## 2017-08-04 DIAGNOSIS — I10 Essential (primary) hypertension: Secondary | ICD-10-CM | POA: Diagnosis not present

## 2017-08-04 DIAGNOSIS — R1310 Dysphagia, unspecified: Secondary | ICD-10-CM | POA: Diagnosis not present

## 2017-08-05 DIAGNOSIS — M6281 Muscle weakness (generalized): Secondary | ICD-10-CM | POA: Diagnosis not present

## 2017-08-05 DIAGNOSIS — H04129 Dry eye syndrome of unspecified lacrimal gland: Secondary | ICD-10-CM | POA: Diagnosis not present

## 2017-08-05 DIAGNOSIS — I69392 Facial weakness following cerebral infarction: Secondary | ICD-10-CM | POA: Diagnosis not present

## 2017-08-05 DIAGNOSIS — N183 Chronic kidney disease, stage 3 (moderate): Secondary | ICD-10-CM | POA: Diagnosis not present

## 2017-08-05 DIAGNOSIS — M62838 Other muscle spasm: Secondary | ICD-10-CM | POA: Diagnosis not present

## 2017-08-05 DIAGNOSIS — I4891 Unspecified atrial fibrillation: Secondary | ICD-10-CM | POA: Diagnosis not present

## 2017-08-05 DIAGNOSIS — I10 Essential (primary) hypertension: Secondary | ICD-10-CM | POA: Diagnosis not present

## 2017-08-05 DIAGNOSIS — R1310 Dysphagia, unspecified: Secondary | ICD-10-CM | POA: Diagnosis not present

## 2017-08-05 DIAGNOSIS — R52 Pain, unspecified: Secondary | ICD-10-CM | POA: Diagnosis not present

## 2017-08-05 DIAGNOSIS — I69352 Hemiplegia and hemiparesis following cerebral infarction affecting left dominant side: Secondary | ICD-10-CM | POA: Diagnosis not present

## 2017-08-05 DIAGNOSIS — D500 Iron deficiency anemia secondary to blood loss (chronic): Secondary | ICD-10-CM | POA: Diagnosis not present

## 2017-08-08 DIAGNOSIS — R52 Pain, unspecified: Secondary | ICD-10-CM | POA: Diagnosis not present

## 2017-08-08 DIAGNOSIS — M6281 Muscle weakness (generalized): Secondary | ICD-10-CM | POA: Diagnosis not present

## 2017-08-08 DIAGNOSIS — I10 Essential (primary) hypertension: Secondary | ICD-10-CM | POA: Diagnosis not present

## 2017-08-08 DIAGNOSIS — I4891 Unspecified atrial fibrillation: Secondary | ICD-10-CM | POA: Diagnosis not present

## 2017-08-08 DIAGNOSIS — M62838 Other muscle spasm: Secondary | ICD-10-CM | POA: Diagnosis not present

## 2017-08-08 DIAGNOSIS — N183 Chronic kidney disease, stage 3 (moderate): Secondary | ICD-10-CM | POA: Diagnosis not present

## 2017-08-08 DIAGNOSIS — R1310 Dysphagia, unspecified: Secondary | ICD-10-CM | POA: Diagnosis not present

## 2017-08-08 DIAGNOSIS — I69352 Hemiplegia and hemiparesis following cerebral infarction affecting left dominant side: Secondary | ICD-10-CM | POA: Diagnosis not present

## 2017-08-08 DIAGNOSIS — D500 Iron deficiency anemia secondary to blood loss (chronic): Secondary | ICD-10-CM | POA: Diagnosis not present

## 2017-08-08 DIAGNOSIS — I69392 Facial weakness following cerebral infarction: Secondary | ICD-10-CM | POA: Diagnosis not present

## 2017-08-08 DIAGNOSIS — H04129 Dry eye syndrome of unspecified lacrimal gland: Secondary | ICD-10-CM | POA: Diagnosis not present

## 2017-08-10 DIAGNOSIS — M6281 Muscle weakness (generalized): Secondary | ICD-10-CM | POA: Diagnosis not present

## 2017-08-10 DIAGNOSIS — I10 Essential (primary) hypertension: Secondary | ICD-10-CM | POA: Diagnosis not present

## 2017-08-10 DIAGNOSIS — M62838 Other muscle spasm: Secondary | ICD-10-CM | POA: Diagnosis not present

## 2017-08-10 DIAGNOSIS — I69392 Facial weakness following cerebral infarction: Secondary | ICD-10-CM | POA: Diagnosis not present

## 2017-08-10 DIAGNOSIS — D500 Iron deficiency anemia secondary to blood loss (chronic): Secondary | ICD-10-CM | POA: Diagnosis not present

## 2017-08-10 DIAGNOSIS — R1310 Dysphagia, unspecified: Secondary | ICD-10-CM | POA: Diagnosis not present

## 2017-08-10 DIAGNOSIS — I69352 Hemiplegia and hemiparesis following cerebral infarction affecting left dominant side: Secondary | ICD-10-CM | POA: Diagnosis not present

## 2017-08-10 DIAGNOSIS — R52 Pain, unspecified: Secondary | ICD-10-CM | POA: Diagnosis not present

## 2017-08-10 DIAGNOSIS — N183 Chronic kidney disease, stage 3 (moderate): Secondary | ICD-10-CM | POA: Diagnosis not present

## 2017-08-10 DIAGNOSIS — I4891 Unspecified atrial fibrillation: Secondary | ICD-10-CM | POA: Diagnosis not present

## 2017-08-10 DIAGNOSIS — H04129 Dry eye syndrome of unspecified lacrimal gland: Secondary | ICD-10-CM | POA: Diagnosis not present

## 2017-08-15 DIAGNOSIS — L02225 Furuncle of perineum: Secondary | ICD-10-CM | POA: Diagnosis not present

## 2017-08-15 DIAGNOSIS — R600 Localized edema: Secondary | ICD-10-CM | POA: Diagnosis not present

## 2017-08-17 DIAGNOSIS — M62838 Other muscle spasm: Secondary | ICD-10-CM | POA: Diagnosis not present

## 2017-08-17 DIAGNOSIS — R52 Pain, unspecified: Secondary | ICD-10-CM | POA: Diagnosis not present

## 2017-08-17 DIAGNOSIS — D500 Iron deficiency anemia secondary to blood loss (chronic): Secondary | ICD-10-CM | POA: Diagnosis not present

## 2017-08-17 DIAGNOSIS — R1310 Dysphagia, unspecified: Secondary | ICD-10-CM | POA: Diagnosis not present

## 2017-08-17 DIAGNOSIS — I10 Essential (primary) hypertension: Secondary | ICD-10-CM | POA: Diagnosis not present

## 2017-08-17 DIAGNOSIS — I69352 Hemiplegia and hemiparesis following cerebral infarction affecting left dominant side: Secondary | ICD-10-CM | POA: Diagnosis not present

## 2017-08-17 DIAGNOSIS — I69392 Facial weakness following cerebral infarction: Secondary | ICD-10-CM | POA: Diagnosis not present

## 2017-08-17 DIAGNOSIS — H04129 Dry eye syndrome of unspecified lacrimal gland: Secondary | ICD-10-CM | POA: Diagnosis not present

## 2017-08-17 DIAGNOSIS — M6281 Muscle weakness (generalized): Secondary | ICD-10-CM | POA: Diagnosis not present

## 2017-08-17 DIAGNOSIS — I4891 Unspecified atrial fibrillation: Secondary | ICD-10-CM | POA: Diagnosis not present

## 2017-08-17 DIAGNOSIS — N183 Chronic kidney disease, stage 3 (moderate): Secondary | ICD-10-CM | POA: Diagnosis not present

## 2017-08-19 DIAGNOSIS — L989 Disorder of the skin and subcutaneous tissue, unspecified: Secondary | ICD-10-CM | POA: Diagnosis not present

## 2017-08-24 ENCOUNTER — Ambulatory Visit: Payer: Self-pay | Admitting: Adult Health

## 2017-08-24 ENCOUNTER — Ambulatory Visit: Payer: Self-pay | Admitting: Neurology

## 2017-08-24 DIAGNOSIS — I69392 Facial weakness following cerebral infarction: Secondary | ICD-10-CM | POA: Diagnosis not present

## 2017-08-24 DIAGNOSIS — I69352 Hemiplegia and hemiparesis following cerebral infarction affecting left dominant side: Secondary | ICD-10-CM | POA: Diagnosis not present

## 2017-08-24 DIAGNOSIS — R1310 Dysphagia, unspecified: Secondary | ICD-10-CM | POA: Diagnosis not present

## 2017-08-30 DIAGNOSIS — I69352 Hemiplegia and hemiparesis following cerebral infarction affecting left dominant side: Secondary | ICD-10-CM | POA: Diagnosis not present

## 2017-08-30 DIAGNOSIS — M6281 Muscle weakness (generalized): Secondary | ICD-10-CM | POA: Diagnosis not present

## 2017-09-01 DIAGNOSIS — R451 Restlessness and agitation: Secondary | ICD-10-CM | POA: Diagnosis not present

## 2017-09-01 DIAGNOSIS — F419 Anxiety disorder, unspecified: Secondary | ICD-10-CM | POA: Diagnosis not present

## 2017-09-01 DIAGNOSIS — R410 Disorientation, unspecified: Secondary | ICD-10-CM | POA: Diagnosis not present

## 2017-09-05 DIAGNOSIS — Z043 Encounter for examination and observation following other accident: Secondary | ICD-10-CM | POA: Diagnosis not present

## 2017-09-05 DIAGNOSIS — W19XXXD Unspecified fall, subsequent encounter: Secondary | ICD-10-CM | POA: Diagnosis not present

## 2017-09-12 DIAGNOSIS — H6121 Impacted cerumen, right ear: Secondary | ICD-10-CM | POA: Diagnosis not present

## 2017-09-16 DIAGNOSIS — L989 Disorder of the skin and subcutaneous tissue, unspecified: Secondary | ICD-10-CM | POA: Diagnosis not present

## 2017-09-20 DIAGNOSIS — I69352 Hemiplegia and hemiparesis following cerebral infarction affecting left dominant side: Secondary | ICD-10-CM | POA: Diagnosis not present

## 2017-09-20 DIAGNOSIS — M6281 Muscle weakness (generalized): Secondary | ICD-10-CM | POA: Diagnosis not present

## 2017-09-20 DIAGNOSIS — R1310 Dysphagia, unspecified: Secondary | ICD-10-CM | POA: Diagnosis not present

## 2017-09-22 DIAGNOSIS — B379 Candidiasis, unspecified: Secondary | ICD-10-CM | POA: Diagnosis not present

## 2017-09-29 DIAGNOSIS — H04129 Dry eye syndrome of unspecified lacrimal gland: Secondary | ICD-10-CM | POA: Diagnosis not present

## 2017-09-29 DIAGNOSIS — I69352 Hemiplegia and hemiparesis following cerebral infarction affecting left dominant side: Secondary | ICD-10-CM | POA: Diagnosis not present

## 2017-09-29 DIAGNOSIS — R1310 Dysphagia, unspecified: Secondary | ICD-10-CM | POA: Diagnosis not present

## 2017-09-29 DIAGNOSIS — M6281 Muscle weakness (generalized): Secondary | ICD-10-CM | POA: Diagnosis not present

## 2017-09-29 DIAGNOSIS — D500 Iron deficiency anemia secondary to blood loss (chronic): Secondary | ICD-10-CM | POA: Diagnosis not present

## 2017-09-29 DIAGNOSIS — I69392 Facial weakness following cerebral infarction: Secondary | ICD-10-CM | POA: Diagnosis not present

## 2017-09-29 DIAGNOSIS — B379 Candidiasis, unspecified: Secondary | ICD-10-CM | POA: Diagnosis not present

## 2017-09-29 DIAGNOSIS — N183 Chronic kidney disease, stage 3 (moderate): Secondary | ICD-10-CM | POA: Diagnosis not present

## 2017-09-29 DIAGNOSIS — I10 Essential (primary) hypertension: Secondary | ICD-10-CM | POA: Diagnosis not present

## 2017-09-29 DIAGNOSIS — I4891 Unspecified atrial fibrillation: Secondary | ICD-10-CM | POA: Diagnosis not present

## 2017-09-29 DIAGNOSIS — R52 Pain, unspecified: Secondary | ICD-10-CM | POA: Diagnosis not present

## 2017-10-03 ENCOUNTER — Other Ambulatory Visit: Payer: Self-pay

## 2017-10-04 DIAGNOSIS — R1310 Dysphagia, unspecified: Secondary | ICD-10-CM | POA: Diagnosis not present

## 2017-10-04 DIAGNOSIS — I10 Essential (primary) hypertension: Secondary | ICD-10-CM | POA: Diagnosis not present

## 2017-10-04 DIAGNOSIS — R52 Pain, unspecified: Secondary | ICD-10-CM | POA: Diagnosis not present

## 2017-10-04 DIAGNOSIS — I69352 Hemiplegia and hemiparesis following cerebral infarction affecting left dominant side: Secondary | ICD-10-CM | POA: Diagnosis not present

## 2017-10-04 DIAGNOSIS — N183 Chronic kidney disease, stage 3 (moderate): Secondary | ICD-10-CM | POA: Diagnosis not present

## 2017-10-04 DIAGNOSIS — I69392 Facial weakness following cerebral infarction: Secondary | ICD-10-CM | POA: Diagnosis not present

## 2017-10-04 DIAGNOSIS — B379 Candidiasis, unspecified: Secondary | ICD-10-CM | POA: Diagnosis not present

## 2017-10-04 DIAGNOSIS — I4891 Unspecified atrial fibrillation: Secondary | ICD-10-CM | POA: Diagnosis not present

## 2017-10-04 DIAGNOSIS — H04129 Dry eye syndrome of unspecified lacrimal gland: Secondary | ICD-10-CM | POA: Diagnosis not present

## 2017-10-04 DIAGNOSIS — D500 Iron deficiency anemia secondary to blood loss (chronic): Secondary | ICD-10-CM | POA: Diagnosis not present

## 2017-10-04 DIAGNOSIS — M6281 Muscle weakness (generalized): Secondary | ICD-10-CM | POA: Diagnosis not present

## 2017-10-07 ENCOUNTER — Other Ambulatory Visit: Payer: Self-pay

## 2017-10-12 DIAGNOSIS — F4322 Adjustment disorder with anxiety: Secondary | ICD-10-CM | POA: Diagnosis not present

## 2017-10-13 ENCOUNTER — Other Ambulatory Visit: Payer: Self-pay

## 2017-10-13 NOTE — Patient Outreach (Signed)
Telephone outreach to patient to obtain mRS was successfully completed. mRS = 5 

## 2017-10-20 ENCOUNTER — Ambulatory Visit: Payer: Self-pay | Admitting: Neurology

## 2017-10-28 ENCOUNTER — Encounter: Payer: Self-pay | Admitting: Cardiology

## 2017-10-28 DIAGNOSIS — M6281 Muscle weakness (generalized): Secondary | ICD-10-CM | POA: Diagnosis not present

## 2017-10-28 DIAGNOSIS — I69352 Hemiplegia and hemiparesis following cerebral infarction affecting left dominant side: Secondary | ICD-10-CM | POA: Diagnosis not present

## 2017-11-01 DIAGNOSIS — M6281 Muscle weakness (generalized): Secondary | ICD-10-CM | POA: Diagnosis not present

## 2017-11-01 DIAGNOSIS — W19XXXA Unspecified fall, initial encounter: Secondary | ICD-10-CM | POA: Diagnosis not present

## 2017-11-03 DIAGNOSIS — I69352 Hemiplegia and hemiparesis following cerebral infarction affecting left dominant side: Secondary | ICD-10-CM | POA: Diagnosis not present

## 2017-11-03 DIAGNOSIS — I4891 Unspecified atrial fibrillation: Secondary | ICD-10-CM | POA: Diagnosis not present

## 2017-11-03 DIAGNOSIS — H04129 Dry eye syndrome of unspecified lacrimal gland: Secondary | ICD-10-CM | POA: Diagnosis not present

## 2017-11-03 DIAGNOSIS — N183 Chronic kidney disease, stage 3 (moderate): Secondary | ICD-10-CM | POA: Diagnosis not present

## 2017-11-03 DIAGNOSIS — M6281 Muscle weakness (generalized): Secondary | ICD-10-CM | POA: Diagnosis not present

## 2017-11-03 DIAGNOSIS — I10 Essential (primary) hypertension: Secondary | ICD-10-CM | POA: Diagnosis not present

## 2017-11-03 DIAGNOSIS — R52 Pain, unspecified: Secondary | ICD-10-CM | POA: Diagnosis not present

## 2017-11-03 DIAGNOSIS — R1310 Dysphagia, unspecified: Secondary | ICD-10-CM | POA: Diagnosis not present

## 2017-11-03 DIAGNOSIS — D500 Iron deficiency anemia secondary to blood loss (chronic): Secondary | ICD-10-CM | POA: Diagnosis not present

## 2017-11-03 DIAGNOSIS — E785 Hyperlipidemia, unspecified: Secondary | ICD-10-CM | POA: Diagnosis not present

## 2017-11-03 DIAGNOSIS — I69392 Facial weakness following cerebral infarction: Secondary | ICD-10-CM | POA: Diagnosis not present

## 2017-11-14 DIAGNOSIS — F515 Nightmare disorder: Secondary | ICD-10-CM | POA: Diagnosis not present

## 2017-11-14 DIAGNOSIS — M62838 Other muscle spasm: Secondary | ICD-10-CM | POA: Diagnosis not present

## 2017-11-15 DIAGNOSIS — R52 Pain, unspecified: Secondary | ICD-10-CM | POA: Diagnosis not present

## 2017-11-15 DIAGNOSIS — I4891 Unspecified atrial fibrillation: Secondary | ICD-10-CM | POA: Diagnosis not present

## 2017-11-15 DIAGNOSIS — N183 Chronic kidney disease, stage 3 (moderate): Secondary | ICD-10-CM | POA: Diagnosis not present

## 2017-11-15 DIAGNOSIS — I10 Essential (primary) hypertension: Secondary | ICD-10-CM | POA: Diagnosis not present

## 2017-11-15 DIAGNOSIS — I69354 Hemiplegia and hemiparesis following cerebral infarction affecting left non-dominant side: Secondary | ICD-10-CM | POA: Diagnosis not present

## 2017-11-15 DIAGNOSIS — J690 Pneumonitis due to inhalation of food and vomit: Secondary | ICD-10-CM | POA: Diagnosis not present

## 2017-11-15 DIAGNOSIS — M6281 Muscle weakness (generalized): Secondary | ICD-10-CM | POA: Diagnosis not present

## 2017-11-15 DIAGNOSIS — I63521 Cerebral infarction due to unspecified occlusion or stenosis of right anterior cerebral artery: Secondary | ICD-10-CM | POA: Diagnosis not present

## 2017-11-15 DIAGNOSIS — K219 Gastro-esophageal reflux disease without esophagitis: Secondary | ICD-10-CM | POA: Diagnosis not present

## 2017-11-15 DIAGNOSIS — E785 Hyperlipidemia, unspecified: Secondary | ICD-10-CM | POA: Diagnosis not present

## 2017-11-15 DIAGNOSIS — H04129 Dry eye syndrome of unspecified lacrimal gland: Secondary | ICD-10-CM | POA: Diagnosis not present

## 2017-11-15 DIAGNOSIS — R1312 Dysphagia, oropharyngeal phase: Secondary | ICD-10-CM | POA: Diagnosis not present

## 2017-11-15 DIAGNOSIS — M62422 Contracture of muscle, left upper arm: Secondary | ICD-10-CM | POA: Diagnosis not present

## 2017-11-15 DIAGNOSIS — I639 Cerebral infarction, unspecified: Secondary | ICD-10-CM | POA: Diagnosis not present

## 2017-11-15 DIAGNOSIS — R278 Other lack of coordination: Secondary | ICD-10-CM | POA: Diagnosis not present

## 2017-11-15 DIAGNOSIS — D500 Iron deficiency anemia secondary to blood loss (chronic): Secondary | ICD-10-CM | POA: Diagnosis not present

## 2017-11-15 NOTE — Progress Notes (Signed)
Arthur [name] Date of Birth: Dec 12, 1928   History of Present Illness: [name] is seen for followup of CAD. He has a history of coronary disease and is status post CABG in 2005. He had mitral valve repair and a Maze procedure at that time. He subsequently developed recurrent CVA and TEE demonstrated persistent left atrial appendage thrombus despite a history of previous ligation of the appendage. He has been on chronic anticoagulation.  In April 2016 he presented with a near syncopal episode. He was found to be in atrial fibrillation. Managed with rate control and anticoagulation. Converted back to NSR.    He underwent nuclear stress test in 01/03/2015 revealed a normal ejection fraction however there was a moderate sized, severe intensity, reversible inferior defect consistent with moderate inferior ischemia. Cardiac cath 01/24/15 which showed severe 3v obstructive CAD with patent LIMA-LAD, SVG-diag, SVG-OM3 and occluded SVG-PDA. He had successful stenting of prox RCA with a DES.   He was admitted on 07/11/17 with left sided weakness with right gaze preference. INR subtherapeutic at admission and CTA head neck showed calcified plaque of aorta and carotid bifurcation, right M2 superior division proximal occlusion with poor down stream collateralization and multiple areas of mild to moderate intracranial atherosclerosis. He underwent cerebral angio with complete revascularization of R-ACA with IA tPA and IV tPA. MRI/MRA brain done revealing acute moderate R-MCA, small right distal ACA infarcts, small right posterior watershed territory infarct with minimal petechial hemorrhage and old large left cerebellar infarct with successful revascularization of R-MCA occlusion. Planned to restart anticoagulation with Eliquis after 7-10 days. Discharged from inpatient Rehab on 08/03/17 to SNF.   On follow up today he is doing OK. He is still in [place] at [place]. Still has left hemiparesis. Denies any chest  pain, palpitations, dizziness. No new CVA symptoms on Eliquis. Seen in a wheelchair. He has a recent cough. CXR showed mild RLL infiltrate. Now on doxycycline. No fever.   Current Outpatient Medications on File Prior to Visit  Medication Sig Dispense Refill  . acetaminophen (TYLENOL) 650 MG CR tablet Take 650 mg by mouth daily as needed (for knee pain).    Marland Kitchen albuterol (PROVENTIL) (2.5 MG/3ML) 0.083% nebulizer solution Take 2.5 mg by nebulization every 6 (six) hours as needed for wheezing or shortness of breath.    Marland Kitchen amLODipine (NORVASC) 5 MG tablet Take 5 mg by mouth daily.    Marland Kitchen apixaban (ELIQUIS) 5 MG TABS tablet Take 1 tablet (5 mg total) by mouth 2 (two) times daily. 60 tablet   . artificial tears (LACRILUBE) OINT ophthalmic ointment Place into the left eye at bedtime.    Marland Kitchen BACLOFEN PO Take 2.5 mg by mouth 2 (two) times daily.    . benazepril (LOTENSIN) 5 MG tablet Take 1 tablet (5 mg total) by mouth daily.    . diclofenac sodium (VOLTAREN) 1 % GEL Apply 2 g topically 4 (four) times daily.    Marland Kitchen DOXYCYCLINE HYCLATE PO Take 100 mg by mouth 2 (two) times daily.    Marland Kitchen guaiFENesin (ROBITUSSIN) 100 MG/5ML liquid Take 200 mg by mouth 3 (three) times daily as needed for cough.    Marland Kitchen guaiFENesin (ROBITUSSIN) 100 MG/5ML SOLN Take 10 mLs by mouth 3 (three) times daily as needed for cough or to loosen phlegm.    Marland Kitchen NITROSTAT 0.4 MG SL tablet PLACE 1 TABLET (0.4 MG TOTAL) UNDER THE TONGUE EVERY 5 (FIVE) MINUTESAS NEEDED FOR CHEST PAIN (UP TO 3 DOSES). 25 tablet 2  .  pantoprazole (PROTONIX) 40 MG tablet Take 1 tablet (40 mg total) by mouth daily.    . phosphorus (K PHOS NEUTRAL) 155-852-130 MG tablet Take 1 tablet (250 mg total) by mouth daily.    . polyvinyl alcohol (LIQUIFILM TEARS) 1.4 % ophthalmic solution Place 1-2 drops into the left eye 4 (four) times daily -  with meals and at bedtime. 15 mL 0  . ranitidine (ZANTAC) 150 MG tablet TAKE ONE TABLET BY MOUTH TWICE A DAY (Patient taking differently: Take  150 mg by mouth two times a day) 180 tablet 2  . rosuvastatin (CRESTOR) 20 MG tablet TAKE 1 TABLET BY MOUTH DAILY (Patient taking differently: Take 20 mg by mouth at bedtime) 90 tablet 2   No current facility-administered medications on file prior to visit.     Allergies  Allergen Reactions  . Tape Other (See Comments)    PATIENT IS TAKING COUMADIN; HIS SKIN TEARS & BRUISES EASILY; PLEASE USE COBAN WRAP OR AN ALTERNATIVE TO MEDICAL TAPE!!  . Lasix [Furosemide] Rash  . Penicillins Rash    Has patient had a PCN reaction causing immediate rash, facial/tongue/throat swelling, SOB or lightheadedness with hypotension: Yes Has patient had a PCN reaction causing severe rash involving mucus membranes or skin necrosis: Unk Has patient had a PCN reaction that required hospitalization: Unk Has patient had a PCN reaction occurring within the last 10 years:  If all of the above answers are "NO", then may proceed with Cephalosporin use.   [name] Cross Reactors Rash    Past Medical History:  Diagnosis Date  . Arthritis    "some in my joints" (01/24/2015)  . CKD (chronic kidney disease), stage II   . Coronary artery disease    a. s/p CABG in 2005 with MV repair and MAZE procedure. b. s/p DES to prox RCA 01/2015.   Marland Kitchen CVA (cerebrovascular accident) [facility]) ~ 2014   RIGHT BRAIN; denies residual on 01/24/2015  . Esophagitis    Distal esophagitis  . GERD (gastroesophageal reflux disease)   . Heart murmur   . History of hiatal hernia   . History of recurrent TIAs   . Hyperlipidemia   . Hypertension   . Hypertensive vascular disease   . MVP (mitral valve prolapse)   . Nephrolithiasis   . Odynophagia   . PAF (paroxysmal atrial fibrillation) ([place])   . Transudative pleural effusion     Past Surgical History:  Procedure Laterality Date  . APPENDECTOMY  1960's  . CARDIAC CATHETERIZATION  03/19/2004  . CARDIAC CATHETERIZATION N/A 01/24/2015   Procedure: Left Heart Cath and Coronary Angiography;   Surgeon: Jennessy Sandridge M Martinique, MD;  Location: [facility];  Service: Cardiovascular;  Laterality: N/A;  . CARDIAC CATHETERIZATION N/A 01/24/2015   Procedure: Coronary Stent Intervention;  Surgeon: Roger Fasnacht M Martinique, MD;  Location: [facility];  Service: Cardiovascular;  Laterality: N/A;  . CORONARY ANGIOPLASTY WITH STENT PLACEMENT  01/24/2015   "1 stent"  . CORONARY ARTERY BYPASS GRAFT  04/2004   LIMA GRAFT TO THE DISTAL LAD, SAPHENOUS VEIN GRAFT TO THE FIRST DIADGONAL BRANCH, SAPHENOUS VEIN GRAFT TO THE THIRD MARIGINAL BRANCH, AND SAPHENOUS VEIN GRAFT TO THE PDA  . IR ANGIO VERTEBRAL SEL SUBCLAVIAN INNOMINATE UNI R MOD SED  07/11/2017  . IR PERCUTANEOUS ART THROMBECTOMY/INFUSION INTRACRANIAL INC DIAG ANGIO  07/11/2017  . MAZE  04/2004  . MITRAL VALVE REPAIR  04/2004  . RADIOLOGY WITH ANESTHESIA N/A 07/11/2017   Procedure: RADIOLOGY WITH ANESTHESIA;  Surgeon: Luanne Bras, MD;  Location: [facility];  Service: Radiology;  Laterality: N/A;  . TEE WITHOUT CARDIOVERSION  05/19/2011   Procedure: TRANSESOPHAGEAL ECHOCARDIOGRAM (TEE);  Surgeon: Hamlet Lasecki Martinique, MD;  Location: [facility];  Service: Cardiovascular;  Laterality: N/A;  . TONSILLECTOMY AND ADENOIDECTOMY  1944    Social History   Tobacco Use  Smoking Status Former Smoker  . Years: 3.00  . Types: Cigarettes  . Last attempt to quit: 06/15/1955  . Years since quitting: 62.4  Smokeless Tobacco Never Used    Social History   Substance and Sexual Activity  Alcohol Use No    Family History  Problem Relation Age of Onset  . Heart attack Mother   . Stroke Father   . Hypertension Father   . Kidney failure Father     Review of Systems: As noted in history of present illness.  All other systems were reviewed and are negative.  Physical Exam: BP 122/61   Pulse 82   Resp (!) 97   Ht 5\' 7"  (1.702 m)   Wt 162 lb (73.5 kg)   BMI 25.37 kg/m  GENERAL:  Elderly WM in NAD HEENT:  PERRL, EOMI, sclera are clear. Oropharynx is  clear. NECK:  No jugular venous distention, carotid upstroke brisk and symmetric, no bruits, no thyromegaly or adenopathy LUNGS:  Bilateral coarse rhonchi and wheezing. CHEST:  Unremarkable HEART:  RRR,  PMI not displaced or sustained,S1 and S2 within normal limits, no S3, no S4: no clicks, no rubs, no murmurs ABD:  Soft, nontender. BS +, no masses or bruits. No hepatomegaly, no splenomegaly EXT:  2 + pulses throughout, no edema, no cyanosis no clubbing SKIN:  Warm and dry.  No rashes NEURO:  Alert and oriented x 3. Cranial nerves II through XII intact. Left sided weakness. PSYCH:  Cognitively intact   LABORATORY DATA:   Lab Results  Component Value Date   WBC 7.0 08/01/2017   HGB 14.9 08/01/2017   HCT 46.3 08/01/2017   PLT 321 08/01/2017   GLUCOSE 96 08/01/2017   CHOL 122 07/12/2017   TRIG 92 07/12/2017   TRIG 93 07/12/2017   HDL 38 (L) 07/12/2017   LDLCALC 66 07/12/2017   ALT 33 07/16/2017   AST 49 (H) 07/16/2017   NA 142 08/01/2017   K 4.0 08/01/2017   CL 108 08/01/2017   CREATININE 1.18 08/01/2017   BUN 24 (H) 08/01/2017   CO2 25 08/01/2017   TSH 0.293 (L) 05/14/2011   INR 1.48 07/11/2017   HGBA1C 5.6 07/13/2017    Echo 07/12/17:Study Conclusions  - Left ventricle: The cavity size was normal. Systolic function was   normal. The estimated ejection fraction was in the range of 55%   to 60%. Wall motion was normal; there were no regional wall   motion abnormalities. Features are consistent with a pseudonormal   left ventricular filling pattern, with concomitant abnormal   relaxation and increased filling pressure (grade 2 diastolic   dysfunction). - Aortic valve: Transvalvular velocity was within the normal range.   There was no stenosis. There was no regurgitation. - Aorta: Ascending aortic diameter: 38 mm (S). - Ascending aorta: The ascending aorta was mildly dilated. - Mitral valve: Prior procedures included surgical repair.   Transvalvular velocity was  within the normal range. There was no   evidence for stenosis. There was mild regurgitation. Valve area   by pressure half-time: 2.29 cm^2. - Left atrium: The atrium was moderately dilated. - Right ventricle: The  cavity size was normal. Wall thickness was   normal. Systolic function was normal. - Atrial septum: No defect or patent foramen ovale was identified. - Tricuspid valve: There was mild regurgitation. - Pulmonary arteries: Systolic pressure was severely increased. PA   peak pressure: 56 mm Hg (S).    Assessment / Plan: 1.  CVA recurrent embolic right MCA. Remote TEE documented a left atrial appendage thrombus despite prior ligation of the atrial appendage. He had been on chronic anticoagulation with coumadin. Now on Eliquis since coumadin subtherapeutic at time of CVA. Continuing Rehab therapies.   2. Coronary disease status post CABG in 2005. He also had a Maze procedure at that time.  Cardiac cath in August 2016 showed all grafts were patent except SVG to the RCA that was occluded. He underwent successful stenting of the proximal RCA with DES. He remains  asymptomatic.   3. Atrial fibrillation-status post Maze procedure. Will continue metoprolol and Eliquis. He has been maintaining NSR.  4. Hypercholesterolemia. On chronic Crestor. Excellent control.  5. HTN. Blood pressure is well controlled. Continue Rx.  6. Bronchitis versus early PNA. Now on doxycycline.   Follow up in 6 months

## 2017-11-16 DIAGNOSIS — J690 Pneumonitis due to inhalation of food and vomit: Secondary | ICD-10-CM | POA: Diagnosis not present

## 2017-11-16 DIAGNOSIS — R1312 Dysphagia, oropharyngeal phase: Secondary | ICD-10-CM | POA: Diagnosis not present

## 2017-11-16 DIAGNOSIS — I63521 Cerebral infarction due to unspecified occlusion or stenosis of right anterior cerebral artery: Secondary | ICD-10-CM | POA: Diagnosis not present

## 2017-11-16 DIAGNOSIS — I69354 Hemiplegia and hemiparesis following cerebral infarction affecting left non-dominant side: Secondary | ICD-10-CM | POA: Diagnosis not present

## 2017-11-16 DIAGNOSIS — M6281 Muscle weakness (generalized): Secondary | ICD-10-CM | POA: Diagnosis not present

## 2017-11-16 DIAGNOSIS — R278 Other lack of coordination: Secondary | ICD-10-CM | POA: Diagnosis not present

## 2017-11-17 DIAGNOSIS — R278 Other lack of coordination: Secondary | ICD-10-CM | POA: Diagnosis not present

## 2017-11-17 DIAGNOSIS — M6281 Muscle weakness (generalized): Secondary | ICD-10-CM | POA: Diagnosis not present

## 2017-11-17 DIAGNOSIS — J690 Pneumonitis due to inhalation of food and vomit: Secondary | ICD-10-CM | POA: Diagnosis not present

## 2017-11-17 DIAGNOSIS — I63521 Cerebral infarction due to unspecified occlusion or stenosis of right anterior cerebral artery: Secondary | ICD-10-CM | POA: Diagnosis not present

## 2017-11-17 DIAGNOSIS — R062 Wheezing: Secondary | ICD-10-CM | POA: Diagnosis not present

## 2017-11-17 DIAGNOSIS — I69354 Hemiplegia and hemiparesis following cerebral infarction affecting left non-dominant side: Secondary | ICD-10-CM | POA: Diagnosis not present

## 2017-11-17 DIAGNOSIS — R05 Cough: Secondary | ICD-10-CM | POA: Diagnosis not present

## 2017-11-17 DIAGNOSIS — F419 Anxiety disorder, unspecified: Secondary | ICD-10-CM | POA: Diagnosis not present

## 2017-11-17 DIAGNOSIS — R1312 Dysphagia, oropharyngeal phase: Secondary | ICD-10-CM | POA: Diagnosis not present

## 2017-11-18 ENCOUNTER — Ambulatory Visit (INDEPENDENT_AMBULATORY_CARE_PROVIDER_SITE_OTHER): Payer: Medicare Other | Admitting: Cardiology

## 2017-11-18 ENCOUNTER — Encounter: Payer: Self-pay | Admitting: Cardiology

## 2017-11-18 VITALS — BP 122/61 | HR 82 | Resp 97 | Ht 67.0 in | Wt 162.0 lb

## 2017-11-18 DIAGNOSIS — I480 Paroxysmal atrial fibrillation: Secondary | ICD-10-CM | POA: Diagnosis not present

## 2017-11-18 DIAGNOSIS — I639 Cerebral infarction, unspecified: Secondary | ICD-10-CM | POA: Diagnosis not present

## 2017-11-18 DIAGNOSIS — E7800 Pure hypercholesterolemia, unspecified: Secondary | ICD-10-CM | POA: Diagnosis not present

## 2017-11-18 DIAGNOSIS — R1312 Dysphagia, oropharyngeal phase: Secondary | ICD-10-CM | POA: Diagnosis not present

## 2017-11-18 DIAGNOSIS — R278 Other lack of coordination: Secondary | ICD-10-CM | POA: Diagnosis not present

## 2017-11-18 DIAGNOSIS — I341 Nonrheumatic mitral (valve) prolapse: Secondary | ICD-10-CM

## 2017-11-18 DIAGNOSIS — M6281 Muscle weakness (generalized): Secondary | ICD-10-CM | POA: Diagnosis not present

## 2017-11-18 DIAGNOSIS — Z952 Presence of prosthetic heart valve: Secondary | ICD-10-CM | POA: Diagnosis not present

## 2017-11-18 DIAGNOSIS — J690 Pneumonitis due to inhalation of food and vomit: Secondary | ICD-10-CM | POA: Diagnosis not present

## 2017-11-18 DIAGNOSIS — I25810 Atherosclerosis of coronary artery bypass graft(s) without angina pectoris: Secondary | ICD-10-CM | POA: Diagnosis not present

## 2017-11-18 DIAGNOSIS — I63521 Cerebral infarction due to unspecified occlusion or stenosis of right anterior cerebral artery: Secondary | ICD-10-CM | POA: Diagnosis not present

## 2017-11-18 DIAGNOSIS — I69354 Hemiplegia and hemiparesis following cerebral infarction affecting left non-dominant side: Secondary | ICD-10-CM | POA: Diagnosis not present

## 2017-11-18 NOTE — Patient Instructions (Signed)
Continue your current therapy  I will see you in 6 months.   

## 2017-11-21 DIAGNOSIS — J690 Pneumonitis due to inhalation of food and vomit: Secondary | ICD-10-CM | POA: Diagnosis not present

## 2017-11-21 DIAGNOSIS — M6281 Muscle weakness (generalized): Secondary | ICD-10-CM | POA: Diagnosis not present

## 2017-11-21 DIAGNOSIS — I63521 Cerebral infarction due to unspecified occlusion or stenosis of right anterior cerebral artery: Secondary | ICD-10-CM | POA: Diagnosis not present

## 2017-11-21 DIAGNOSIS — R278 Other lack of coordination: Secondary | ICD-10-CM | POA: Diagnosis not present

## 2017-11-21 DIAGNOSIS — Z79899 Other long term (current) drug therapy: Secondary | ICD-10-CM | POA: Diagnosis not present

## 2017-11-21 DIAGNOSIS — I69354 Hemiplegia and hemiparesis following cerebral infarction affecting left non-dominant side: Secondary | ICD-10-CM | POA: Diagnosis not present

## 2017-11-21 DIAGNOSIS — R1312 Dysphagia, oropharyngeal phase: Secondary | ICD-10-CM | POA: Diagnosis not present

## 2017-11-21 DIAGNOSIS — J159 Unspecified bacterial pneumonia: Secondary | ICD-10-CM | POA: Diagnosis not present

## 2017-11-21 DIAGNOSIS — R05 Cough: Secondary | ICD-10-CM | POA: Diagnosis not present

## 2017-11-22 DIAGNOSIS — M6281 Muscle weakness (generalized): Secondary | ICD-10-CM | POA: Diagnosis not present

## 2017-11-22 DIAGNOSIS — J690 Pneumonitis due to inhalation of food and vomit: Secondary | ICD-10-CM | POA: Diagnosis not present

## 2017-11-22 DIAGNOSIS — R1312 Dysphagia, oropharyngeal phase: Secondary | ICD-10-CM | POA: Diagnosis not present

## 2017-11-22 DIAGNOSIS — R278 Other lack of coordination: Secondary | ICD-10-CM | POA: Diagnosis not present

## 2017-11-22 DIAGNOSIS — I63521 Cerebral infarction due to unspecified occlusion or stenosis of right anterior cerebral artery: Secondary | ICD-10-CM | POA: Diagnosis not present

## 2017-11-22 DIAGNOSIS — I69354 Hemiplegia and hemiparesis following cerebral infarction affecting left non-dominant side: Secondary | ICD-10-CM | POA: Diagnosis not present

## 2017-11-23 DIAGNOSIS — I63521 Cerebral infarction due to unspecified occlusion or stenosis of right anterior cerebral artery: Secondary | ICD-10-CM | POA: Diagnosis not present

## 2017-11-23 DIAGNOSIS — M6281 Muscle weakness (generalized): Secondary | ICD-10-CM | POA: Diagnosis not present

## 2017-11-23 DIAGNOSIS — J690 Pneumonitis due to inhalation of food and vomit: Secondary | ICD-10-CM | POA: Diagnosis not present

## 2017-11-23 DIAGNOSIS — R1312 Dysphagia, oropharyngeal phase: Secondary | ICD-10-CM | POA: Diagnosis not present

## 2017-11-23 DIAGNOSIS — R278 Other lack of coordination: Secondary | ICD-10-CM | POA: Diagnosis not present

## 2017-11-23 DIAGNOSIS — I69354 Hemiplegia and hemiparesis following cerebral infarction affecting left non-dominant side: Secondary | ICD-10-CM | POA: Diagnosis not present

## 2017-11-24 DIAGNOSIS — R278 Other lack of coordination: Secondary | ICD-10-CM | POA: Diagnosis not present

## 2017-11-24 DIAGNOSIS — M6281 Muscle weakness (generalized): Secondary | ICD-10-CM | POA: Diagnosis not present

## 2017-11-24 DIAGNOSIS — J690 Pneumonitis due to inhalation of food and vomit: Secondary | ICD-10-CM | POA: Diagnosis not present

## 2017-11-24 DIAGNOSIS — F419 Anxiety disorder, unspecified: Secondary | ICD-10-CM | POA: Diagnosis not present

## 2017-11-24 DIAGNOSIS — I69354 Hemiplegia and hemiparesis following cerebral infarction affecting left non-dominant side: Secondary | ICD-10-CM | POA: Diagnosis not present

## 2017-11-24 DIAGNOSIS — I63521 Cerebral infarction due to unspecified occlusion or stenosis of right anterior cerebral artery: Secondary | ICD-10-CM | POA: Diagnosis not present

## 2017-11-24 DIAGNOSIS — R1312 Dysphagia, oropharyngeal phase: Secondary | ICD-10-CM | POA: Diagnosis not present

## 2017-11-25 DIAGNOSIS — I63521 Cerebral infarction due to unspecified occlusion or stenosis of right anterior cerebral artery: Secondary | ICD-10-CM | POA: Diagnosis not present

## 2017-11-25 DIAGNOSIS — J690 Pneumonitis due to inhalation of food and vomit: Secondary | ICD-10-CM | POA: Diagnosis not present

## 2017-11-25 DIAGNOSIS — R1312 Dysphagia, oropharyngeal phase: Secondary | ICD-10-CM | POA: Diagnosis not present

## 2017-11-25 DIAGNOSIS — I69354 Hemiplegia and hemiparesis following cerebral infarction affecting left non-dominant side: Secondary | ICD-10-CM | POA: Diagnosis not present

## 2017-11-25 DIAGNOSIS — M6281 Muscle weakness (generalized): Secondary | ICD-10-CM | POA: Diagnosis not present

## 2017-11-25 DIAGNOSIS — R278 Other lack of coordination: Secondary | ICD-10-CM | POA: Diagnosis not present

## 2017-11-28 DIAGNOSIS — J690 Pneumonitis due to inhalation of food and vomit: Secondary | ICD-10-CM | POA: Diagnosis not present

## 2017-11-28 DIAGNOSIS — I69354 Hemiplegia and hemiparesis following cerebral infarction affecting left non-dominant side: Secondary | ICD-10-CM | POA: Diagnosis not present

## 2017-11-28 DIAGNOSIS — M6281 Muscle weakness (generalized): Secondary | ICD-10-CM | POA: Diagnosis not present

## 2017-11-28 DIAGNOSIS — R1312 Dysphagia, oropharyngeal phase: Secondary | ICD-10-CM | POA: Diagnosis not present

## 2017-11-28 DIAGNOSIS — I63521 Cerebral infarction due to unspecified occlusion or stenosis of right anterior cerebral artery: Secondary | ICD-10-CM | POA: Diagnosis not present

## 2017-11-28 DIAGNOSIS — R278 Other lack of coordination: Secondary | ICD-10-CM | POA: Diagnosis not present

## 2017-11-29 DIAGNOSIS — M6281 Muscle weakness (generalized): Secondary | ICD-10-CM | POA: Diagnosis not present

## 2017-11-29 DIAGNOSIS — I63521 Cerebral infarction due to unspecified occlusion or stenosis of right anterior cerebral artery: Secondary | ICD-10-CM | POA: Diagnosis not present

## 2017-11-29 DIAGNOSIS — R1312 Dysphagia, oropharyngeal phase: Secondary | ICD-10-CM | POA: Diagnosis not present

## 2017-11-29 DIAGNOSIS — I69354 Hemiplegia and hemiparesis following cerebral infarction affecting left non-dominant side: Secondary | ICD-10-CM | POA: Diagnosis not present

## 2017-11-29 DIAGNOSIS — J690 Pneumonitis due to inhalation of food and vomit: Secondary | ICD-10-CM | POA: Diagnosis not present

## 2017-11-29 DIAGNOSIS — R278 Other lack of coordination: Secondary | ICD-10-CM | POA: Diagnosis not present

## 2017-11-30 DIAGNOSIS — I63521 Cerebral infarction due to unspecified occlusion or stenosis of right anterior cerebral artery: Secondary | ICD-10-CM | POA: Diagnosis not present

## 2017-11-30 DIAGNOSIS — R1312 Dysphagia, oropharyngeal phase: Secondary | ICD-10-CM | POA: Diagnosis not present

## 2017-11-30 DIAGNOSIS — I69354 Hemiplegia and hemiparesis following cerebral infarction affecting left non-dominant side: Secondary | ICD-10-CM | POA: Diagnosis not present

## 2017-11-30 DIAGNOSIS — R278 Other lack of coordination: Secondary | ICD-10-CM | POA: Diagnosis not present

## 2017-11-30 DIAGNOSIS — J690 Pneumonitis due to inhalation of food and vomit: Secondary | ICD-10-CM | POA: Diagnosis not present

## 2017-11-30 DIAGNOSIS — M6281 Muscle weakness (generalized): Secondary | ICD-10-CM | POA: Diagnosis not present

## 2017-12-01 DIAGNOSIS — I69352 Hemiplegia and hemiparesis following cerebral infarction affecting left dominant side: Secondary | ICD-10-CM | POA: Diagnosis not present

## 2017-12-01 DIAGNOSIS — I10 Essential (primary) hypertension: Secondary | ICD-10-CM | POA: Diagnosis not present

## 2017-12-01 DIAGNOSIS — N183 Chronic kidney disease, stage 3 (moderate): Secondary | ICD-10-CM | POA: Diagnosis not present

## 2017-12-01 DIAGNOSIS — R52 Pain, unspecified: Secondary | ICD-10-CM | POA: Diagnosis not present

## 2017-12-01 DIAGNOSIS — M62422 Contracture of muscle, left upper arm: Secondary | ICD-10-CM | POA: Diagnosis not present

## 2017-12-01 DIAGNOSIS — M6281 Muscle weakness (generalized): Secondary | ICD-10-CM | POA: Diagnosis not present

## 2017-12-01 DIAGNOSIS — R278 Other lack of coordination: Secondary | ICD-10-CM | POA: Diagnosis not present

## 2017-12-01 DIAGNOSIS — J690 Pneumonitis due to inhalation of food and vomit: Secondary | ICD-10-CM | POA: Diagnosis not present

## 2017-12-01 DIAGNOSIS — I69392 Facial weakness following cerebral infarction: Secondary | ICD-10-CM | POA: Diagnosis not present

## 2017-12-01 DIAGNOSIS — I69354 Hemiplegia and hemiparesis following cerebral infarction affecting left non-dominant side: Secondary | ICD-10-CM | POA: Diagnosis not present

## 2017-12-01 DIAGNOSIS — I4891 Unspecified atrial fibrillation: Secondary | ICD-10-CM | POA: Diagnosis not present

## 2017-12-01 DIAGNOSIS — I639 Cerebral infarction, unspecified: Secondary | ICD-10-CM | POA: Diagnosis not present

## 2017-12-01 DIAGNOSIS — E785 Hyperlipidemia, unspecified: Secondary | ICD-10-CM | POA: Diagnosis not present

## 2017-12-01 DIAGNOSIS — D500 Iron deficiency anemia secondary to blood loss (chronic): Secondary | ICD-10-CM | POA: Diagnosis not present

## 2017-12-01 DIAGNOSIS — R1312 Dysphagia, oropharyngeal phase: Secondary | ICD-10-CM | POA: Diagnosis not present

## 2017-12-01 DIAGNOSIS — I63521 Cerebral infarction due to unspecified occlusion or stenosis of right anterior cerebral artery: Secondary | ICD-10-CM | POA: Diagnosis not present

## 2017-12-01 DIAGNOSIS — J189 Pneumonia, unspecified organism: Secondary | ICD-10-CM | POA: Diagnosis not present

## 2017-12-01 DIAGNOSIS — F419 Anxiety disorder, unspecified: Secondary | ICD-10-CM | POA: Diagnosis not present

## 2017-12-02 DIAGNOSIS — R278 Other lack of coordination: Secondary | ICD-10-CM | POA: Diagnosis not present

## 2017-12-02 DIAGNOSIS — R1312 Dysphagia, oropharyngeal phase: Secondary | ICD-10-CM | POA: Diagnosis not present

## 2017-12-02 DIAGNOSIS — I69354 Hemiplegia and hemiparesis following cerebral infarction affecting left non-dominant side: Secondary | ICD-10-CM | POA: Diagnosis not present

## 2017-12-02 DIAGNOSIS — J690 Pneumonitis due to inhalation of food and vomit: Secondary | ICD-10-CM | POA: Diagnosis not present

## 2017-12-02 DIAGNOSIS — M6281 Muscle weakness (generalized): Secondary | ICD-10-CM | POA: Diagnosis not present

## 2017-12-02 DIAGNOSIS — I63521 Cerebral infarction due to unspecified occlusion or stenosis of right anterior cerebral artery: Secondary | ICD-10-CM | POA: Diagnosis not present

## 2017-12-05 DIAGNOSIS — R278 Other lack of coordination: Secondary | ICD-10-CM | POA: Diagnosis not present

## 2017-12-05 DIAGNOSIS — I69354 Hemiplegia and hemiparesis following cerebral infarction affecting left non-dominant side: Secondary | ICD-10-CM | POA: Diagnosis not present

## 2017-12-05 DIAGNOSIS — R1312 Dysphagia, oropharyngeal phase: Secondary | ICD-10-CM | POA: Diagnosis not present

## 2017-12-05 DIAGNOSIS — J984 Other disorders of lung: Secondary | ICD-10-CM | POA: Diagnosis not present

## 2017-12-05 DIAGNOSIS — M6281 Muscle weakness (generalized): Secondary | ICD-10-CM | POA: Diagnosis not present

## 2017-12-05 DIAGNOSIS — I63521 Cerebral infarction due to unspecified occlusion or stenosis of right anterior cerebral artery: Secondary | ICD-10-CM | POA: Diagnosis not present

## 2017-12-05 DIAGNOSIS — J690 Pneumonitis due to inhalation of food and vomit: Secondary | ICD-10-CM | POA: Diagnosis not present

## 2017-12-06 DIAGNOSIS — J690 Pneumonitis due to inhalation of food and vomit: Secondary | ICD-10-CM | POA: Diagnosis not present

## 2017-12-06 DIAGNOSIS — I63521 Cerebral infarction due to unspecified occlusion or stenosis of right anterior cerebral artery: Secondary | ICD-10-CM | POA: Diagnosis not present

## 2017-12-06 DIAGNOSIS — R1312 Dysphagia, oropharyngeal phase: Secondary | ICD-10-CM | POA: Diagnosis not present

## 2017-12-06 DIAGNOSIS — M6281 Muscle weakness (generalized): Secondary | ICD-10-CM | POA: Diagnosis not present

## 2017-12-06 DIAGNOSIS — R278 Other lack of coordination: Secondary | ICD-10-CM | POA: Diagnosis not present

## 2017-12-06 DIAGNOSIS — I69354 Hemiplegia and hemiparesis following cerebral infarction affecting left non-dominant side: Secondary | ICD-10-CM | POA: Diagnosis not present

## 2017-12-07 DIAGNOSIS — R278 Other lack of coordination: Secondary | ICD-10-CM | POA: Diagnosis not present

## 2017-12-07 DIAGNOSIS — R1312 Dysphagia, oropharyngeal phase: Secondary | ICD-10-CM | POA: Diagnosis not present

## 2017-12-07 DIAGNOSIS — J690 Pneumonitis due to inhalation of food and vomit: Secondary | ICD-10-CM | POA: Diagnosis not present

## 2017-12-07 DIAGNOSIS — I69354 Hemiplegia and hemiparesis following cerebral infarction affecting left non-dominant side: Secondary | ICD-10-CM | POA: Diagnosis not present

## 2017-12-07 DIAGNOSIS — M6281 Muscle weakness (generalized): Secondary | ICD-10-CM | POA: Diagnosis not present

## 2017-12-07 DIAGNOSIS — I63521 Cerebral infarction due to unspecified occlusion or stenosis of right anterior cerebral artery: Secondary | ICD-10-CM | POA: Diagnosis not present

## 2017-12-07 DIAGNOSIS — F4322 Adjustment disorder with anxiety: Secondary | ICD-10-CM | POA: Diagnosis not present

## 2017-12-08 DIAGNOSIS — I69354 Hemiplegia and hemiparesis following cerebral infarction affecting left non-dominant side: Secondary | ICD-10-CM | POA: Diagnosis not present

## 2017-12-08 DIAGNOSIS — I63521 Cerebral infarction due to unspecified occlusion or stenosis of right anterior cerebral artery: Secondary | ICD-10-CM | POA: Diagnosis not present

## 2017-12-08 DIAGNOSIS — R278 Other lack of coordination: Secondary | ICD-10-CM | POA: Diagnosis not present

## 2017-12-08 DIAGNOSIS — R1312 Dysphagia, oropharyngeal phase: Secondary | ICD-10-CM | POA: Diagnosis not present

## 2017-12-08 DIAGNOSIS — M6281 Muscle weakness (generalized): Secondary | ICD-10-CM | POA: Diagnosis not present

## 2017-12-08 DIAGNOSIS — J690 Pneumonitis due to inhalation of food and vomit: Secondary | ICD-10-CM | POA: Diagnosis not present

## 2017-12-09 DIAGNOSIS — M6281 Muscle weakness (generalized): Secondary | ICD-10-CM | POA: Diagnosis not present

## 2017-12-09 DIAGNOSIS — I69354 Hemiplegia and hemiparesis following cerebral infarction affecting left non-dominant side: Secondary | ICD-10-CM | POA: Diagnosis not present

## 2017-12-09 DIAGNOSIS — I63521 Cerebral infarction due to unspecified occlusion or stenosis of right anterior cerebral artery: Secondary | ICD-10-CM | POA: Diagnosis not present

## 2017-12-09 DIAGNOSIS — R1312 Dysphagia, oropharyngeal phase: Secondary | ICD-10-CM | POA: Diagnosis not present

## 2017-12-09 DIAGNOSIS — R278 Other lack of coordination: Secondary | ICD-10-CM | POA: Diagnosis not present

## 2017-12-09 DIAGNOSIS — J690 Pneumonitis due to inhalation of food and vomit: Secondary | ICD-10-CM | POA: Diagnosis not present

## 2017-12-12 DIAGNOSIS — I63521 Cerebral infarction due to unspecified occlusion or stenosis of right anterior cerebral artery: Secondary | ICD-10-CM | POA: Diagnosis not present

## 2017-12-12 DIAGNOSIS — R278 Other lack of coordination: Secondary | ICD-10-CM | POA: Diagnosis not present

## 2017-12-12 DIAGNOSIS — J690 Pneumonitis due to inhalation of food and vomit: Secondary | ICD-10-CM | POA: Diagnosis not present

## 2017-12-12 DIAGNOSIS — I69354 Hemiplegia and hemiparesis following cerebral infarction affecting left non-dominant side: Secondary | ICD-10-CM | POA: Diagnosis not present

## 2017-12-12 DIAGNOSIS — R1312 Dysphagia, oropharyngeal phase: Secondary | ICD-10-CM | POA: Diagnosis not present

## 2017-12-12 DIAGNOSIS — M6281 Muscle weakness (generalized): Secondary | ICD-10-CM | POA: Diagnosis not present

## 2017-12-13 DIAGNOSIS — M6281 Muscle weakness (generalized): Secondary | ICD-10-CM | POA: Diagnosis not present

## 2017-12-13 DIAGNOSIS — R278 Other lack of coordination: Secondary | ICD-10-CM | POA: Diagnosis not present

## 2017-12-13 DIAGNOSIS — I63521 Cerebral infarction due to unspecified occlusion or stenosis of right anterior cerebral artery: Secondary | ICD-10-CM | POA: Diagnosis not present

## 2017-12-13 DIAGNOSIS — J690 Pneumonitis due to inhalation of food and vomit: Secondary | ICD-10-CM | POA: Diagnosis not present

## 2017-12-13 DIAGNOSIS — R1312 Dysphagia, oropharyngeal phase: Secondary | ICD-10-CM | POA: Diagnosis not present

## 2017-12-13 DIAGNOSIS — I69354 Hemiplegia and hemiparesis following cerebral infarction affecting left non-dominant side: Secondary | ICD-10-CM | POA: Diagnosis not present

## 2017-12-14 DIAGNOSIS — I63521 Cerebral infarction due to unspecified occlusion or stenosis of right anterior cerebral artery: Secondary | ICD-10-CM | POA: Diagnosis not present

## 2017-12-14 DIAGNOSIS — I69354 Hemiplegia and hemiparesis following cerebral infarction affecting left non-dominant side: Secondary | ICD-10-CM | POA: Diagnosis not present

## 2017-12-14 DIAGNOSIS — J690 Pneumonitis due to inhalation of food and vomit: Secondary | ICD-10-CM | POA: Diagnosis not present

## 2017-12-14 DIAGNOSIS — R278 Other lack of coordination: Secondary | ICD-10-CM | POA: Diagnosis not present

## 2017-12-14 DIAGNOSIS — R1312 Dysphagia, oropharyngeal phase: Secondary | ICD-10-CM | POA: Diagnosis not present

## 2017-12-14 DIAGNOSIS — M6281 Muscle weakness (generalized): Secondary | ICD-10-CM | POA: Diagnosis not present

## 2017-12-15 DIAGNOSIS — I63521 Cerebral infarction due to unspecified occlusion or stenosis of right anterior cerebral artery: Secondary | ICD-10-CM | POA: Diagnosis not present

## 2017-12-15 DIAGNOSIS — R278 Other lack of coordination: Secondary | ICD-10-CM | POA: Diagnosis not present

## 2017-12-15 DIAGNOSIS — M6281 Muscle weakness (generalized): Secondary | ICD-10-CM | POA: Diagnosis not present

## 2017-12-15 DIAGNOSIS — J690 Pneumonitis due to inhalation of food and vomit: Secondary | ICD-10-CM | POA: Diagnosis not present

## 2017-12-15 DIAGNOSIS — R1312 Dysphagia, oropharyngeal phase: Secondary | ICD-10-CM | POA: Diagnosis not present

## 2017-12-15 DIAGNOSIS — I69354 Hemiplegia and hemiparesis following cerebral infarction affecting left non-dominant side: Secondary | ICD-10-CM | POA: Diagnosis not present

## 2017-12-16 DIAGNOSIS — M6281 Muscle weakness (generalized): Secondary | ICD-10-CM | POA: Diagnosis not present

## 2017-12-16 DIAGNOSIS — J690 Pneumonitis due to inhalation of food and vomit: Secondary | ICD-10-CM | POA: Diagnosis not present

## 2017-12-16 DIAGNOSIS — R1312 Dysphagia, oropharyngeal phase: Secondary | ICD-10-CM | POA: Diagnosis not present

## 2017-12-16 DIAGNOSIS — R278 Other lack of coordination: Secondary | ICD-10-CM | POA: Diagnosis not present

## 2017-12-16 DIAGNOSIS — I69354 Hemiplegia and hemiparesis following cerebral infarction affecting left non-dominant side: Secondary | ICD-10-CM | POA: Diagnosis not present

## 2017-12-16 DIAGNOSIS — I63521 Cerebral infarction due to unspecified occlusion or stenosis of right anterior cerebral artery: Secondary | ICD-10-CM | POA: Diagnosis not present

## 2017-12-17 DIAGNOSIS — R1312 Dysphagia, oropharyngeal phase: Secondary | ICD-10-CM | POA: Diagnosis not present

## 2017-12-17 DIAGNOSIS — J690 Pneumonitis due to inhalation of food and vomit: Secondary | ICD-10-CM | POA: Diagnosis not present

## 2017-12-17 DIAGNOSIS — I63521 Cerebral infarction due to unspecified occlusion or stenosis of right anterior cerebral artery: Secondary | ICD-10-CM | POA: Diagnosis not present

## 2017-12-17 DIAGNOSIS — I69354 Hemiplegia and hemiparesis following cerebral infarction affecting left non-dominant side: Secondary | ICD-10-CM | POA: Diagnosis not present

## 2017-12-17 DIAGNOSIS — R278 Other lack of coordination: Secondary | ICD-10-CM | POA: Diagnosis not present

## 2017-12-17 DIAGNOSIS — M6281 Muscle weakness (generalized): Secondary | ICD-10-CM | POA: Diagnosis not present

## 2017-12-19 DIAGNOSIS — R1312 Dysphagia, oropharyngeal phase: Secondary | ICD-10-CM | POA: Diagnosis not present

## 2017-12-19 DIAGNOSIS — I69354 Hemiplegia and hemiparesis following cerebral infarction affecting left non-dominant side: Secondary | ICD-10-CM | POA: Diagnosis not present

## 2017-12-19 DIAGNOSIS — R278 Other lack of coordination: Secondary | ICD-10-CM | POA: Diagnosis not present

## 2017-12-19 DIAGNOSIS — I63521 Cerebral infarction due to unspecified occlusion or stenosis of right anterior cerebral artery: Secondary | ICD-10-CM | POA: Diagnosis not present

## 2017-12-19 DIAGNOSIS — J690 Pneumonitis due to inhalation of food and vomit: Secondary | ICD-10-CM | POA: Diagnosis not present

## 2017-12-19 DIAGNOSIS — M6281 Muscle weakness (generalized): Secondary | ICD-10-CM | POA: Diagnosis not present

## 2017-12-20 DIAGNOSIS — J690 Pneumonitis due to inhalation of food and vomit: Secondary | ICD-10-CM | POA: Diagnosis not present

## 2017-12-20 DIAGNOSIS — R1312 Dysphagia, oropharyngeal phase: Secondary | ICD-10-CM | POA: Diagnosis not present

## 2017-12-20 DIAGNOSIS — M6281 Muscle weakness (generalized): Secondary | ICD-10-CM | POA: Diagnosis not present

## 2017-12-20 DIAGNOSIS — R278 Other lack of coordination: Secondary | ICD-10-CM | POA: Diagnosis not present

## 2017-12-20 DIAGNOSIS — I63521 Cerebral infarction due to unspecified occlusion or stenosis of right anterior cerebral artery: Secondary | ICD-10-CM | POA: Diagnosis not present

## 2017-12-20 DIAGNOSIS — I69354 Hemiplegia and hemiparesis following cerebral infarction affecting left non-dominant side: Secondary | ICD-10-CM | POA: Diagnosis not present

## 2017-12-21 DIAGNOSIS — I63521 Cerebral infarction due to unspecified occlusion or stenosis of right anterior cerebral artery: Secondary | ICD-10-CM | POA: Diagnosis not present

## 2017-12-21 DIAGNOSIS — R278 Other lack of coordination: Secondary | ICD-10-CM | POA: Diagnosis not present

## 2017-12-21 DIAGNOSIS — F4322 Adjustment disorder with anxiety: Secondary | ICD-10-CM | POA: Diagnosis not present

## 2017-12-21 DIAGNOSIS — J690 Pneumonitis due to inhalation of food and vomit: Secondary | ICD-10-CM | POA: Diagnosis not present

## 2017-12-21 DIAGNOSIS — I69354 Hemiplegia and hemiparesis following cerebral infarction affecting left non-dominant side: Secondary | ICD-10-CM | POA: Diagnosis not present

## 2017-12-21 DIAGNOSIS — R1312 Dysphagia, oropharyngeal phase: Secondary | ICD-10-CM | POA: Diagnosis not present

## 2017-12-21 DIAGNOSIS — M6281 Muscle weakness (generalized): Secondary | ICD-10-CM | POA: Diagnosis not present

## 2017-12-22 DIAGNOSIS — J690 Pneumonitis due to inhalation of food and vomit: Secondary | ICD-10-CM | POA: Diagnosis not present

## 2017-12-22 DIAGNOSIS — M6281 Muscle weakness (generalized): Secondary | ICD-10-CM | POA: Diagnosis not present

## 2017-12-22 DIAGNOSIS — I63521 Cerebral infarction due to unspecified occlusion or stenosis of right anterior cerebral artery: Secondary | ICD-10-CM | POA: Diagnosis not present

## 2017-12-22 DIAGNOSIS — F419 Anxiety disorder, unspecified: Secondary | ICD-10-CM | POA: Diagnosis not present

## 2017-12-22 DIAGNOSIS — Q845 Enlarged and hypertrophic nails: Secondary | ICD-10-CM | POA: Diagnosis not present

## 2017-12-22 DIAGNOSIS — L603 Nail dystrophy: Secondary | ICD-10-CM | POA: Diagnosis not present

## 2017-12-22 DIAGNOSIS — I739 Peripheral vascular disease, unspecified: Secondary | ICD-10-CM | POA: Diagnosis not present

## 2017-12-22 DIAGNOSIS — B351 Tinea unguium: Secondary | ICD-10-CM | POA: Diagnosis not present

## 2017-12-22 DIAGNOSIS — R1312 Dysphagia, oropharyngeal phase: Secondary | ICD-10-CM | POA: Diagnosis not present

## 2017-12-22 DIAGNOSIS — I69354 Hemiplegia and hemiparesis following cerebral infarction affecting left non-dominant side: Secondary | ICD-10-CM | POA: Diagnosis not present

## 2017-12-22 DIAGNOSIS — R278 Other lack of coordination: Secondary | ICD-10-CM | POA: Diagnosis not present

## 2017-12-23 DIAGNOSIS — J690 Pneumonitis due to inhalation of food and vomit: Secondary | ICD-10-CM | POA: Diagnosis not present

## 2017-12-23 DIAGNOSIS — I69354 Hemiplegia and hemiparesis following cerebral infarction affecting left non-dominant side: Secondary | ICD-10-CM | POA: Diagnosis not present

## 2017-12-23 DIAGNOSIS — R278 Other lack of coordination: Secondary | ICD-10-CM | POA: Diagnosis not present

## 2017-12-23 DIAGNOSIS — I63521 Cerebral infarction due to unspecified occlusion or stenosis of right anterior cerebral artery: Secondary | ICD-10-CM | POA: Diagnosis not present

## 2017-12-23 DIAGNOSIS — M6281 Muscle weakness (generalized): Secondary | ICD-10-CM | POA: Diagnosis not present

## 2017-12-23 DIAGNOSIS — R1312 Dysphagia, oropharyngeal phase: Secondary | ICD-10-CM | POA: Diagnosis not present

## 2017-12-25 DIAGNOSIS — M6281 Muscle weakness (generalized): Secondary | ICD-10-CM | POA: Diagnosis not present

## 2017-12-25 DIAGNOSIS — J690 Pneumonitis due to inhalation of food and vomit: Secondary | ICD-10-CM | POA: Diagnosis not present

## 2017-12-25 DIAGNOSIS — I69354 Hemiplegia and hemiparesis following cerebral infarction affecting left non-dominant side: Secondary | ICD-10-CM | POA: Diagnosis not present

## 2017-12-25 DIAGNOSIS — R278 Other lack of coordination: Secondary | ICD-10-CM | POA: Diagnosis not present

## 2017-12-25 DIAGNOSIS — R1312 Dysphagia, oropharyngeal phase: Secondary | ICD-10-CM | POA: Diagnosis not present

## 2017-12-25 DIAGNOSIS — I63521 Cerebral infarction due to unspecified occlusion or stenosis of right anterior cerebral artery: Secondary | ICD-10-CM | POA: Diagnosis not present

## 2017-12-26 ENCOUNTER — Ambulatory Visit: Payer: Self-pay | Admitting: Neurology

## 2017-12-26 DIAGNOSIS — M6281 Muscle weakness (generalized): Secondary | ICD-10-CM | POA: Diagnosis not present

## 2017-12-26 DIAGNOSIS — J690 Pneumonitis due to inhalation of food and vomit: Secondary | ICD-10-CM | POA: Diagnosis not present

## 2017-12-26 DIAGNOSIS — I69354 Hemiplegia and hemiparesis following cerebral infarction affecting left non-dominant side: Secondary | ICD-10-CM | POA: Diagnosis not present

## 2017-12-26 DIAGNOSIS — I63521 Cerebral infarction due to unspecified occlusion or stenosis of right anterior cerebral artery: Secondary | ICD-10-CM | POA: Diagnosis not present

## 2017-12-26 DIAGNOSIS — R1312 Dysphagia, oropharyngeal phase: Secondary | ICD-10-CM | POA: Diagnosis not present

## 2017-12-26 DIAGNOSIS — R278 Other lack of coordination: Secondary | ICD-10-CM | POA: Diagnosis not present

## 2017-12-27 DIAGNOSIS — M6281 Muscle weakness (generalized): Secondary | ICD-10-CM | POA: Diagnosis not present

## 2017-12-27 DIAGNOSIS — R1312 Dysphagia, oropharyngeal phase: Secondary | ICD-10-CM | POA: Diagnosis not present

## 2017-12-27 DIAGNOSIS — J690 Pneumonitis due to inhalation of food and vomit: Secondary | ICD-10-CM | POA: Diagnosis not present

## 2017-12-27 DIAGNOSIS — I69354 Hemiplegia and hemiparesis following cerebral infarction affecting left non-dominant side: Secondary | ICD-10-CM | POA: Diagnosis not present

## 2017-12-27 DIAGNOSIS — I63521 Cerebral infarction due to unspecified occlusion or stenosis of right anterior cerebral artery: Secondary | ICD-10-CM | POA: Diagnosis not present

## 2017-12-27 DIAGNOSIS — R278 Other lack of coordination: Secondary | ICD-10-CM | POA: Diagnosis not present

## 2017-12-28 DIAGNOSIS — J690 Pneumonitis due to inhalation of food and vomit: Secondary | ICD-10-CM | POA: Diagnosis not present

## 2017-12-28 DIAGNOSIS — R278 Other lack of coordination: Secondary | ICD-10-CM | POA: Diagnosis not present

## 2017-12-28 DIAGNOSIS — I69354 Hemiplegia and hemiparesis following cerebral infarction affecting left non-dominant side: Secondary | ICD-10-CM | POA: Diagnosis not present

## 2017-12-28 DIAGNOSIS — I63521 Cerebral infarction due to unspecified occlusion or stenosis of right anterior cerebral artery: Secondary | ICD-10-CM | POA: Diagnosis not present

## 2017-12-28 DIAGNOSIS — M6281 Muscle weakness (generalized): Secondary | ICD-10-CM | POA: Diagnosis not present

## 2017-12-28 DIAGNOSIS — R1312 Dysphagia, oropharyngeal phase: Secondary | ICD-10-CM | POA: Diagnosis not present

## 2017-12-29 DIAGNOSIS — M6281 Muscle weakness (generalized): Secondary | ICD-10-CM | POA: Diagnosis not present

## 2017-12-29 DIAGNOSIS — I69354 Hemiplegia and hemiparesis following cerebral infarction affecting left non-dominant side: Secondary | ICD-10-CM | POA: Diagnosis not present

## 2017-12-29 DIAGNOSIS — R1312 Dysphagia, oropharyngeal phase: Secondary | ICD-10-CM | POA: Diagnosis not present

## 2017-12-29 DIAGNOSIS — F419 Anxiety disorder, unspecified: Secondary | ICD-10-CM | POA: Diagnosis not present

## 2017-12-29 DIAGNOSIS — I63521 Cerebral infarction due to unspecified occlusion or stenosis of right anterior cerebral artery: Secondary | ICD-10-CM | POA: Diagnosis not present

## 2017-12-29 DIAGNOSIS — J690 Pneumonitis due to inhalation of food and vomit: Secondary | ICD-10-CM | POA: Diagnosis not present

## 2017-12-29 DIAGNOSIS — R278 Other lack of coordination: Secondary | ICD-10-CM | POA: Diagnosis not present

## 2017-12-30 DIAGNOSIS — D500 Iron deficiency anemia secondary to blood loss (chronic): Secondary | ICD-10-CM | POA: Diagnosis not present

## 2017-12-30 DIAGNOSIS — I69352 Hemiplegia and hemiparesis following cerebral infarction affecting left dominant side: Secondary | ICD-10-CM | POA: Diagnosis not present

## 2017-12-30 DIAGNOSIS — R52 Pain, unspecified: Secondary | ICD-10-CM | POA: Diagnosis not present

## 2017-12-30 DIAGNOSIS — I639 Cerebral infarction, unspecified: Secondary | ICD-10-CM | POA: Diagnosis not present

## 2017-12-30 DIAGNOSIS — M6281 Muscle weakness (generalized): Secondary | ICD-10-CM | POA: Diagnosis not present

## 2017-12-30 DIAGNOSIS — M62422 Contracture of muscle, left upper arm: Secondary | ICD-10-CM | POA: Diagnosis not present

## 2017-12-30 DIAGNOSIS — I4891 Unspecified atrial fibrillation: Secondary | ICD-10-CM | POA: Diagnosis not present

## 2017-12-30 DIAGNOSIS — N183 Chronic kidney disease, stage 3 (moderate): Secondary | ICD-10-CM | POA: Diagnosis not present

## 2017-12-30 DIAGNOSIS — J189 Pneumonia, unspecified organism: Secondary | ICD-10-CM | POA: Diagnosis not present

## 2017-12-30 DIAGNOSIS — E785 Hyperlipidemia, unspecified: Secondary | ICD-10-CM | POA: Diagnosis not present

## 2017-12-30 DIAGNOSIS — I10 Essential (primary) hypertension: Secondary | ICD-10-CM | POA: Diagnosis not present

## 2017-12-30 DIAGNOSIS — I69392 Facial weakness following cerebral infarction: Secondary | ICD-10-CM | POA: Diagnosis not present

## 2017-12-31 DIAGNOSIS — I63521 Cerebral infarction due to unspecified occlusion or stenosis of right anterior cerebral artery: Secondary | ICD-10-CM | POA: Diagnosis not present

## 2017-12-31 DIAGNOSIS — J690 Pneumonitis due to inhalation of food and vomit: Secondary | ICD-10-CM | POA: Diagnosis not present

## 2017-12-31 DIAGNOSIS — R1312 Dysphagia, oropharyngeal phase: Secondary | ICD-10-CM | POA: Diagnosis not present

## 2017-12-31 DIAGNOSIS — I69354 Hemiplegia and hemiparesis following cerebral infarction affecting left non-dominant side: Secondary | ICD-10-CM | POA: Diagnosis not present

## 2017-12-31 DIAGNOSIS — M6281 Muscle weakness (generalized): Secondary | ICD-10-CM | POA: Diagnosis not present

## 2017-12-31 DIAGNOSIS — R278 Other lack of coordination: Secondary | ICD-10-CM | POA: Diagnosis not present

## 2018-01-02 DIAGNOSIS — M6281 Muscle weakness (generalized): Secondary | ICD-10-CM | POA: Diagnosis not present

## 2018-01-02 DIAGNOSIS — I63521 Cerebral infarction due to unspecified occlusion or stenosis of right anterior cerebral artery: Secondary | ICD-10-CM | POA: Diagnosis not present

## 2018-01-02 DIAGNOSIS — I69354 Hemiplegia and hemiparesis following cerebral infarction affecting left non-dominant side: Secondary | ICD-10-CM | POA: Diagnosis not present

## 2018-01-02 DIAGNOSIS — J690 Pneumonitis due to inhalation of food and vomit: Secondary | ICD-10-CM | POA: Diagnosis not present

## 2018-01-02 DIAGNOSIS — R278 Other lack of coordination: Secondary | ICD-10-CM | POA: Diagnosis not present

## 2018-01-02 DIAGNOSIS — R1312 Dysphagia, oropharyngeal phase: Secondary | ICD-10-CM | POA: Diagnosis not present

## 2018-01-03 DIAGNOSIS — M6281 Muscle weakness (generalized): Secondary | ICD-10-CM | POA: Diagnosis not present

## 2018-01-03 DIAGNOSIS — R1312 Dysphagia, oropharyngeal phase: Secondary | ICD-10-CM | POA: Diagnosis not present

## 2018-01-03 DIAGNOSIS — R278 Other lack of coordination: Secondary | ICD-10-CM | POA: Diagnosis not present

## 2018-01-03 DIAGNOSIS — I69354 Hemiplegia and hemiparesis following cerebral infarction affecting left non-dominant side: Secondary | ICD-10-CM | POA: Diagnosis not present

## 2018-01-03 DIAGNOSIS — J690 Pneumonitis due to inhalation of food and vomit: Secondary | ICD-10-CM | POA: Diagnosis not present

## 2018-01-03 DIAGNOSIS — I63521 Cerebral infarction due to unspecified occlusion or stenosis of right anterior cerebral artery: Secondary | ICD-10-CM | POA: Diagnosis not present

## 2018-01-04 DIAGNOSIS — R278 Other lack of coordination: Secondary | ICD-10-CM | POA: Diagnosis not present

## 2018-01-04 DIAGNOSIS — M6281 Muscle weakness (generalized): Secondary | ICD-10-CM | POA: Diagnosis not present

## 2018-01-04 DIAGNOSIS — J690 Pneumonitis due to inhalation of food and vomit: Secondary | ICD-10-CM | POA: Diagnosis not present

## 2018-01-04 DIAGNOSIS — I63521 Cerebral infarction due to unspecified occlusion or stenosis of right anterior cerebral artery: Secondary | ICD-10-CM | POA: Diagnosis not present

## 2018-01-04 DIAGNOSIS — I69354 Hemiplegia and hemiparesis following cerebral infarction affecting left non-dominant side: Secondary | ICD-10-CM | POA: Diagnosis not present

## 2018-01-04 DIAGNOSIS — R1312 Dysphagia, oropharyngeal phase: Secondary | ICD-10-CM | POA: Diagnosis not present

## 2018-01-05 DIAGNOSIS — R278 Other lack of coordination: Secondary | ICD-10-CM | POA: Diagnosis not present

## 2018-01-05 DIAGNOSIS — I63521 Cerebral infarction due to unspecified occlusion or stenosis of right anterior cerebral artery: Secondary | ICD-10-CM | POA: Diagnosis not present

## 2018-01-05 DIAGNOSIS — J690 Pneumonitis due to inhalation of food and vomit: Secondary | ICD-10-CM | POA: Diagnosis not present

## 2018-01-05 DIAGNOSIS — M6281 Muscle weakness (generalized): Secondary | ICD-10-CM | POA: Diagnosis not present

## 2018-01-05 DIAGNOSIS — I69354 Hemiplegia and hemiparesis following cerebral infarction affecting left non-dominant side: Secondary | ICD-10-CM | POA: Diagnosis not present

## 2018-01-05 DIAGNOSIS — R1312 Dysphagia, oropharyngeal phase: Secondary | ICD-10-CM | POA: Diagnosis not present

## 2018-01-06 DIAGNOSIS — I69354 Hemiplegia and hemiparesis following cerebral infarction affecting left non-dominant side: Secondary | ICD-10-CM | POA: Diagnosis not present

## 2018-01-06 DIAGNOSIS — R1312 Dysphagia, oropharyngeal phase: Secondary | ICD-10-CM | POA: Diagnosis not present

## 2018-01-06 DIAGNOSIS — R278 Other lack of coordination: Secondary | ICD-10-CM | POA: Diagnosis not present

## 2018-01-06 DIAGNOSIS — I63521 Cerebral infarction due to unspecified occlusion or stenosis of right anterior cerebral artery: Secondary | ICD-10-CM | POA: Diagnosis not present

## 2018-01-06 DIAGNOSIS — M6281 Muscle weakness (generalized): Secondary | ICD-10-CM | POA: Diagnosis not present

## 2018-01-06 DIAGNOSIS — J690 Pneumonitis due to inhalation of food and vomit: Secondary | ICD-10-CM | POA: Diagnosis not present

## 2018-01-09 DIAGNOSIS — I69354 Hemiplegia and hemiparesis following cerebral infarction affecting left non-dominant side: Secondary | ICD-10-CM | POA: Diagnosis not present

## 2018-01-09 DIAGNOSIS — I63521 Cerebral infarction due to unspecified occlusion or stenosis of right anterior cerebral artery: Secondary | ICD-10-CM | POA: Diagnosis not present

## 2018-01-09 DIAGNOSIS — J690 Pneumonitis due to inhalation of food and vomit: Secondary | ICD-10-CM | POA: Diagnosis not present

## 2018-01-09 DIAGNOSIS — R278 Other lack of coordination: Secondary | ICD-10-CM | POA: Diagnosis not present

## 2018-01-09 DIAGNOSIS — M6281 Muscle weakness (generalized): Secondary | ICD-10-CM | POA: Diagnosis not present

## 2018-01-09 DIAGNOSIS — R1312 Dysphagia, oropharyngeal phase: Secondary | ICD-10-CM | POA: Diagnosis not present

## 2018-01-19 DIAGNOSIS — F419 Anxiety disorder, unspecified: Secondary | ICD-10-CM | POA: Diagnosis not present

## 2018-01-22 DIAGNOSIS — R0989 Other specified symptoms and signs involving the circulatory and respiratory systems: Secondary | ICD-10-CM | POA: Diagnosis not present

## 2018-01-23 DIAGNOSIS — R05 Cough: Secondary | ICD-10-CM | POA: Diagnosis not present

## 2018-01-23 DIAGNOSIS — J209 Acute bronchitis, unspecified: Secondary | ICD-10-CM | POA: Diagnosis not present

## 2018-01-24 DIAGNOSIS — K219 Gastro-esophageal reflux disease without esophagitis: Secondary | ICD-10-CM | POA: Diagnosis not present

## 2018-01-24 DIAGNOSIS — I639 Cerebral infarction, unspecified: Secondary | ICD-10-CM | POA: Diagnosis not present

## 2018-01-24 DIAGNOSIS — M6281 Muscle weakness (generalized): Secondary | ICD-10-CM | POA: Diagnosis not present

## 2018-01-24 DIAGNOSIS — E785 Hyperlipidemia, unspecified: Secondary | ICD-10-CM | POA: Diagnosis not present

## 2018-01-24 DIAGNOSIS — H04129 Dry eye syndrome of unspecified lacrimal gland: Secondary | ICD-10-CM | POA: Diagnosis not present

## 2018-01-24 DIAGNOSIS — J189 Pneumonia, unspecified organism: Secondary | ICD-10-CM | POA: Diagnosis not present

## 2018-01-24 DIAGNOSIS — R52 Pain, unspecified: Secondary | ICD-10-CM | POA: Diagnosis not present

## 2018-01-24 DIAGNOSIS — N183 Chronic kidney disease, stage 3 (moderate): Secondary | ICD-10-CM | POA: Diagnosis not present

## 2018-01-24 DIAGNOSIS — I4891 Unspecified atrial fibrillation: Secondary | ICD-10-CM | POA: Diagnosis not present

## 2018-01-24 DIAGNOSIS — I10 Essential (primary) hypertension: Secondary | ICD-10-CM | POA: Diagnosis not present

## 2018-01-24 DIAGNOSIS — I69352 Hemiplegia and hemiparesis following cerebral infarction affecting left dominant side: Secondary | ICD-10-CM | POA: Diagnosis not present

## 2018-01-25 DIAGNOSIS — F4322 Adjustment disorder with anxiety: Secondary | ICD-10-CM | POA: Diagnosis not present

## 2018-01-30 DIAGNOSIS — M6281 Muscle weakness (generalized): Secondary | ICD-10-CM | POA: Diagnosis not present

## 2018-01-30 DIAGNOSIS — I69352 Hemiplegia and hemiparesis following cerebral infarction affecting left dominant side: Secondary | ICD-10-CM | POA: Diagnosis not present

## 2018-01-30 DIAGNOSIS — E785 Hyperlipidemia, unspecified: Secondary | ICD-10-CM | POA: Diagnosis not present

## 2018-01-30 DIAGNOSIS — J209 Acute bronchitis, unspecified: Secondary | ICD-10-CM | POA: Diagnosis not present

## 2018-01-30 DIAGNOSIS — I10 Essential (primary) hypertension: Secondary | ICD-10-CM | POA: Diagnosis not present

## 2018-01-30 DIAGNOSIS — R05 Cough: Secondary | ICD-10-CM | POA: Diagnosis not present

## 2018-01-30 DIAGNOSIS — I4891 Unspecified atrial fibrillation: Secondary | ICD-10-CM | POA: Diagnosis not present

## 2018-01-30 DIAGNOSIS — R062 Wheezing: Secondary | ICD-10-CM | POA: Diagnosis not present

## 2018-01-30 DIAGNOSIS — N183 Chronic kidney disease, stage 3 (moderate): Secondary | ICD-10-CM | POA: Diagnosis not present

## 2018-01-30 DIAGNOSIS — R635 Abnormal weight gain: Secondary | ICD-10-CM | POA: Diagnosis not present

## 2018-01-30 DIAGNOSIS — I639 Cerebral infarction, unspecified: Secondary | ICD-10-CM | POA: Diagnosis not present

## 2018-01-30 DIAGNOSIS — D500 Iron deficiency anemia secondary to blood loss (chronic): Secondary | ICD-10-CM | POA: Diagnosis not present

## 2018-02-02 DIAGNOSIS — F419 Anxiety disorder, unspecified: Secondary | ICD-10-CM | POA: Diagnosis not present

## 2018-02-03 DIAGNOSIS — J209 Acute bronchitis, unspecified: Secondary | ICD-10-CM | POA: Diagnosis not present

## 2018-02-03 DIAGNOSIS — R05 Cough: Secondary | ICD-10-CM | POA: Diagnosis not present

## 2018-02-03 DIAGNOSIS — J849 Interstitial pulmonary disease, unspecified: Secondary | ICD-10-CM | POA: Diagnosis not present

## 2018-02-03 DIAGNOSIS — R062 Wheezing: Secondary | ICD-10-CM | POA: Diagnosis not present

## 2018-02-08 ENCOUNTER — Ambulatory Visit (INDEPENDENT_AMBULATORY_CARE_PROVIDER_SITE_OTHER): Payer: Medicare Other | Admitting: Neurology

## 2018-02-08 ENCOUNTER — Encounter: Payer: Self-pay | Admitting: Neurology

## 2018-02-08 VITALS — BP 156/96 | HR 91 | Ht 67.0 in

## 2018-02-08 DIAGNOSIS — G8110 Spastic hemiplegia affecting unspecified side: Secondary | ICD-10-CM

## 2018-02-08 DIAGNOSIS — I639 Cerebral infarction, unspecified: Secondary | ICD-10-CM

## 2018-02-08 NOTE — Patient Instructions (Signed)
I had a long d/w patient and his wife about his recent embolic stroke,spastic hemiplegia, atrial fibrillationrisk for recurrent stroke/TIAs, personally independently reviewed imaging studies and stroke evaluation results and answered questions.Continue Eliquis (apixaban) daily  for secondary stroke prevention and maintain strict control of hypertension with blood pressure goal below 130/90, diabetes with hemoglobin A1c goal below 6.5% and lipids with LDL cholesterol goal below 70 mg/dL.  He was advised to continue physical and occupational therapy. Followup in the future with meonly if necessary and no routine schedule appointment was made   Stroke Prevention Some medical conditions and behaviors are associated with a higher chance of having a stroke. You can help prevent a stroke by making nutrition, lifestyle, and other changes, including managing any medical conditions you may have. What nutrition changes can be made?  Eat healthy foods. You can do this by: ? Choosing foods high in fiber, such as fresh fruits and vegetables and whole grains. ? Eating at least 5 or more servings of fruits and vegetables a day. Try to fill half of your plate at each meal with fruits and vegetables. ? Choosing lean protein foods, such as lean cuts of meat, poultry without skin, fish, tofu, beans, and nuts. ? Eating low-fat dairy products. ? Avoiding foods that are high in salt (sodium). This can help lower blood pressure. ? Avoiding foods that have saturated fat, trans fat, and cholesterol. This can help prevent high cholesterol. ? Avoiding processed and premade foods.  Follow your health care provider's specific guidelines for losing weight, controlling high blood pressure (hypertension), lowering high cholesterol, and managing diabetes. These may include: ? Reducing your daily calorie intake. ? Limiting your daily sodium intake to 1,500 milligrams (mg). ? Using only healthy fats for cooking, such as olive oil,  canola oil, or sunflower oil. ? Counting your daily carbohydrate intake. What lifestyle changes can be made?  Maintain a healthy weight. Talk to your health care provider about your ideal weight.  Get at least 30 minutes of moderate physical activity at least 5 days a week. Moderate activity includes brisk walking, biking, and swimming.  Do not use any products that contain nicotine or tobacco, such as cigarettes and e-cigarettes. If you need help quitting, ask your health care provider. It may also be helpful to avoid exposure to secondhand smoke.  Limit alcohol intake to no more than 1 drink a day for nonpregnant women and 2 drinks a day for men. One drink equals 12 oz of beer, 5 oz of wine, or 1 oz of hard liquor.  Stop any illegal drug use.  Avoid taking birth control pills. Talk to your health care provider about the risks of taking birth control pills if: ? You are over 73 years old. ? You smoke. ? You get migraines. ? You have ever had a blood clot. What other changes can be made?  Manage your cholesterol levels. ? Eating a healthy diet is important for preventing high cholesterol. If cholesterol cannot be managed through diet alone, you may also need to take medicines. ? Take any prescribed medicines to control your cholesterol as told by your health care provider.  Manage your diabetes. ? Eating a healthy diet and exercising regularly are important parts of managing your blood sugar. If your blood sugar cannot be managed through diet and exercise, you may need to take medicines. ? Take any prescribed medicines to control your diabetes as told by your health care provider.  Control your hypertension. ? To reduce  your risk of stroke, try to keep your blood pressure below 130/80. ? Eating a healthy diet and exercising regularly are an important part of controlling your blood pressure. If your blood pressure cannot be managed through diet and exercise, you may need to take  medicines. ? Take any prescribed medicines to control hypertension as told by your health care provider. ? Ask your health care provider if you should monitor your blood pressure at home. ? Have your blood pressure checked every year, even if your blood pressure is normal. Blood pressure increases with age and some medical conditions.  Get evaluated for sleep disorders (sleep apnea). Talk to your health care provider about getting a sleep evaluation if you snore a lot or have excessive sleepiness.  Take over-the-counter and prescription medicines only as told by your health care provider. Aspirin or blood thinners (antiplatelets or anticoagulants) may be recommended to reduce your risk of forming blood clots that can lead to stroke.  Make sure that any other medical conditions you have, such as atrial fibrillation or atherosclerosis, are managed. What are the warning signs of a stroke? The warning signs of a stroke can be easily remembered as BEFAST.  B is for balance. Signs include: ? Dizziness. ? Loss of balance or coordination. ? Sudden trouble walking.  E is for eyes. Signs include: ? A sudden change in vision. ? Trouble seeing.  F is for face. Signs include: ? Sudden weakness or numbness of the face. ? The face or eyelid drooping to one side.  A is for arms. Signs include: ? Sudden weakness or numbness of the arm, usually on one side of the body.  S is for speech. Signs include: ? Trouble speaking (aphasia). ? Trouble understanding.  T is for time. ? These symptoms may represent a serious problem that is an emergency. Do not wait to see if the symptoms will go away. Get medical help right away. Call your local emergency services (911 in the [place] Do not drive yourself to the hospital.  Other signs of stroke may include: ? A sudden, severe headache with no known cause. ? Nausea or vomiting. ? Seizure.  Where to find more information: For more information,  visit:  American Stroke Association: www.strokeassociation.org  National Stroke Association: www.stroke.org  Summary  You can prevent a stroke by eating healthy, exercising, not smoking, limiting alcohol intake, and managing any medical conditions you may have.  Do not use any products that contain nicotine or tobacco, such as cigarettes and e-cigarettes. If you need help quitting, ask your health care provider. It may also be helpful to avoid exposure to secondhand smoke.  Remember BEFAST for warning signs of stroke. Get help right away if you or a loved one has any of these signs. This information is not intended to replace advice given to you by your health care provider. Make sure you discuss any questions you have with your health care provider. Document Released: 07/08/2004 Document Revised: 07/06/2016 Document Reviewed: 07/06/2016 Elsevier Interactive Patient Education  [employer].

## 2018-02-08 NOTE — Progress Notes (Signed)
[facility] [address] [place]. [place] [address] [phone]       OFFICE FOLLOW-UP NOTE  Arthur. Arthur [name] Date of Birth:  04-Nov-1928 Medical Record Number:  [id number]   HPI: Arthur [name] is a 84 year old pleasant Caucasian male seen today for initial office follow-up visit after hospital admission for stroke in January 2019. He is accompanied by his wife. History is obtained from them as well as review of electronic medical records. I have personally reviewed imaging films.HPI ( per Dr Lorraine Lax) Arthur [name] is an 84 y.o. male with PMH of CVA, left atrial appendage thrombus, CAD s/p CABG, Afib s/p Maze, HLD, HTN  On Coumadin presents as stroke alert for left side weakness and right gaze deviation.  Per wife, patient drove her to [facility] around 5pm. Patient was waiting in the car and when she returned he had left facial droop, left side weakness and not talking right. She called 911 and EMS brought pt to [facility] ER. BP on arrival was 172/85 and CBG was 92. Patient was neglecting left side, hemiplegic and had forced right gaze deviation. CT head showed no hemorrhage. Pt received tpA was INR returned as 1.48. CTA revealed m2 RMCA cuttoff and patient taken to IR. Date last known well: 07/11/17  Time last known well: 5.15 pm  tPA Given: yes    NIHSS: 19 Baseline MRS 1.. He was given IV TPA and CT angiogram showed a right M2 superior division occlusion with poor downstream: Collateral sedation with multiple areas of mild to moderate intracranial atherosclerosis. After taking written informed consent patient went for mechanical thrombectomy with Dr. Estanislado Pandy who obtained endovascular complete revascularization of occluded right anterior cerebral artery pericallosal artery with 7 mg of super selective intracranial intra-tPA achieving a TICI 3 Reperfusion. But S  In the  right middle cerebral artery it could get only TICI 2b reperfusion.follow-up MRI scan showed moderate size right MCA and  small distal right ACA infarcts with small right posterior watershed territory infarct with minimal petechial hemorrhage without frank hemorrhagic conversion. Patient was kept in intensive care unit and blood pressure was tightly controlled. He was transferred out of the unit when stable and within the physical occupational therapy and rehabilitation team. It is felt to be a candidate for rehabilitation and skilled nursing setting. He is currently still and [facility]. He is still not able to walk but can stand and help with transfers. He is getting physical occupational therapy. The patient's son-in-law is a physical therapist and the family is interested in moving him home to live with his daughter and son-in-law and get therapy at home. Patient continues to have significant left hemiplegia as not able to move the left upper extremity at all. He has some antigravity strength in the left leg but still cannot bear weight or walk. He was on warfarin at the time of stroke with suboptimal INR and now has been switched to eliquis which he seems to be tolerating well without bruising or bleeding. His blood pressure seems to be under good control though it is slightly elevated today. He is tolerating Crestor well without muscle aches and pains. following IV tPA ROS:   14 system review of systems is positive for  Cough, walking difficulty, speech difficulty, weakness and all other systems negative PMH:  Past Medical History:  Diagnosis Date  . Arthritis    "some in my joints" (01/24/2015)  . CKD (chronic kidney disease), stage II   . Coronary  artery disease    a. s/p CABG in 2005 with MV repair and MAZE procedure. b. s/p DES to prox RCA 01/2015.   Marland Kitchen CVA (cerebrovascular accident) [facility]) ~ 2014   RIGHT BRAIN; denies residual on 01/24/2015  . Esophagitis    Distal esophagitis  . GERD (gastroesophageal reflux disease)   . Heart murmur   . History of hiatal hernia   . History of recurrent TIAs   .  Hyperlipidemia   . Hypertension   . Hypertensive vascular disease   . MVP (mitral valve prolapse)   . Nephrolithiasis   . Odynophagia   . PAF (paroxysmal atrial fibrillation) ([place])   . Transudative pleural effusion     Social History:  Social History   Socioeconomic History  . Marital status: Married    Spouse name: Not on file  . Number of children: Not on file  . Years of education: Not on file  . Highest education level: Not on file  Occupational History  . 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 Financial resource strain: Not on file  . Food insecurity:    Worry: Not on file    Inability: Not on file  . Transportation needs:    Medical: Not on file    Non-medical: Not on file  Tobacco Use  . Smoking status: Former Smoker    Years: 3.00    Types: Cigarettes    Last attempt to quit: 06/15/1955    Years since quitting: 62.6  . Smokeless tobacco: Never Used  Substance and Sexual Activity  . Alcohol use: No  . Drug use: No  . Sexual activity: Never  Lifestyle  . Physical activity:    Days per week: Not on file    Minutes per session: Not on file  . Stress: Not on file  Relationships  . Social connections:    Talks on phone: Not on file    Gets together: Not on file    Attends religious service: Not on file    Active member of club or organization: Not on file    Attends meetings of clubs or organizations: Not on file    Relationship status: Not on file  . Intimate partner violence:    Fear of current or ex partner: Not on file    Emotionally abused: Not on file    Physically abused: Not on file    Forced sexual activity: Not on file  Other Topics Concern  . Not on file  Social History Narrative  . Not on file    Medications:   Current Outpatient Medications on File Prior to Visit  Medication Sig Dispense Refill  . acetaminophen (TYLENOL) 650 MG CR tablet Take 650 mg by mouth daily as needed (for knee pain).    Marland Kitchen amLODipine (NORVASC) 2.5  MG tablet   98  . apixaban (ELIQUIS) 5 MG TABS tablet Take 1 tablet (5 mg total) by mouth 2 (two) times daily. 60 tablet   . artificial tears (LACRILUBE) OINT ophthalmic ointment Place into the left eye at bedtime.    Marland Kitchen BACLOFEN PO Take 2.5 mg by mouth 2 (two) times daily.    . benazepril (LOTENSIN) 5 MG tablet Take 1 tablet (5 mg total) by mouth daily.    . bumetanide (BUMEX) 1 MG tablet   0  . diclofenac sodium (VOLTAREN) 1 % GEL Apply 2 g topically 4 (four) times daily.    Marland Kitchen DOXYCYCLINE HYCLATE PO Take 100 mg  by mouth 2 (two) times daily.    Marland Kitchen ELIQUIS 2.5 MG TABS tablet   0  . guaiFENesin (ROBITUSSIN) 100 MG/5ML liquid Take 200 mg by mouth 3 (three) times daily as needed for cough.    . montelukast (SINGULAIR) 10 MG tablet   98  . NITROSTAT 0.4 MG SL tablet PLACE 1 TABLET (0.4 MG TOTAL) UNDER THE TONGUE EVERY 5 (FIVE) MINUTESAS NEEDED FOR CHEST PAIN (UP TO 3 DOSES). 25 tablet 2  . pantoprazole (PROTONIX) 40 MG tablet Take 1 tablet (40 mg total) by mouth daily.    . phosphorus (K PHOS NEUTRAL) 155-852-130 MG tablet Take 1 tablet (250 mg total) by mouth daily.    . polyvinyl alcohol (LIQUIFILM TEARS) 1.4 % ophthalmic solution Place 1-2 drops into the left eye 4 (four) times daily -  with meals and at bedtime. 15 mL 0  . ranitidine (ZANTAC) 150 MG tablet TAKE ONE TABLET BY MOUTH TWICE A DAY (Patient taking differently: Take 150 mg by mouth two times a day) 180 tablet 2  . rosuvastatin (CRESTOR) 20 MG tablet TAKE 1 TABLET BY MOUTH DAILY (Patient taking differently: Take 20 mg by mouth at bedtime) 90 tablet 2   No current facility-administered medications on file prior to visit.     Allergies:   Allergies  Allergen Reactions  . Tape Other (See Comments)    PATIENT IS TAKING COUMADIN; HIS SKIN TEARS & BRUISES EASILY; PLEASE USE COBAN WRAP OR AN ALTERNATIVE TO MEDICAL TAPE!!  . Lasix [Furosemide] Rash  . Penicillins Rash    Has patient had a PCN reaction causing immediate rash,  facial/tongue/throat swelling, SOB or lightheadedness with hypotension: Yes Has patient had a PCN reaction causing severe rash involving mucus membranes or skin necrosis: Unk Has patient had a PCN reaction that required hospitalization: Unk Has patient had a PCN reaction occurring within the last 10 years:  If all of the above answers are "NO", then may proceed with Cephalosporin use.   . Sulfa Drugs Cross Reactors Rash    Physical Exam General: pleasant frail elderly caucasian male, seated, in no evident distress Head: head normocephalic and atraumatic.  Neck: supple with no carotid or supraclavicular bruits Cardiovascular: regular rate and rhythm, no murmurs Musculoskeletal: no deformity except left foot drop and wearing a left foot brace Skin:  no rash/petichiae Vascular:  Normal pulses all extremities Vitals:   02/08/18 1122  BP: (!) 156/96  Pulse: 91   Neurologic Exam Mental Status: Awake and fully alert. Oriented to place and time. Recent and remote memory poor. Attention span, concentration and fund of knowledge diminished. Mood and affect appropriate.  Cranial Nerves: Fundoscopic exam reveals sharp disc margins. Pupils equal, briskly reactive to light. Extraocular movements full without nystagmus. Visual fields full to confrontation. Hearing diminished slightly. Facial sensation intact.Mild left facial weakness, tongue, palate moves normally and symmetrically.  Motor: spastic left hemiplegia with 0/5 left upper extremity strength with on fixed flexion contractures of the left fingers and wrist. 3/5 strength proximally in the left lower extremity and 2/5 strength at the knee and left foot drop and ankle dorsiflexor weakness. Tone is increased on the left compared to the right. Normal strength on the right side.. Sensory.: intact to touch ,pinprick .position and vibratory sensation.  Coordination: normal coordination on the right and impaired on the left. Gait and Station: patient  unable to walk even with assistance and sitting in a wheelchair and struck tested Reflexes: 2+ and asymmetric And brisker on the  left. Toes downgoing.   NIHSS  10 Modified Rankin 4   ASSESSMENT: 84 year old Caucasian male with embolic right MCA infarct in January 2019 secondary to atrial fibrillation treated with IV TPA and mechanical thrombectomy but yet with significant residual spastic left hemiplegia. Vascular risk factors of hypertension, hyperlipidemia and atrial fibrillation     PLAN: I had a long d/w patient and his wife about his recent embolic stroke,spastic hemiplegia, atrial fibrillationrisk for recurrent stroke/TIAs, personally independently reviewed imaging studies and stroke evaluation results and answered questions.Continue Eliquis (apixaban) daily  for secondary stroke prevention and maintain strict control of hypertension with blood pressure goal below 130/90, diabetes with hemoglobin A1c goal below 6.5% and lipids with LDL cholesterol goal below 70 mg/dL.  He was advised to continue physical and occupational therapy. Followup in the future with meonly if necessary and no routine schedule appointment was made Greater than 50% of time during this 25 minute visit was spent on counseling,explanation of diagnosis of embolic stroke and spastic hemiplegia, planning of further management, discussion with patient and family and coordination of care Antony Contras, MD  [facility] [address] [place], [place] [address]  Phone [phone] Fax [phone]: This document was prepared with digital dictation and possible smart phrase technology. Any transcriptional errors that result from this process are unintentional

## 2018-02-09 DIAGNOSIS — F419 Anxiety disorder, unspecified: Secondary | ICD-10-CM | POA: Diagnosis not present

## 2018-02-09 DIAGNOSIS — I10 Essential (primary) hypertension: Secondary | ICD-10-CM | POA: Diagnosis not present

## 2018-02-09 DIAGNOSIS — D649 Anemia, unspecified: Secondary | ICD-10-CM | POA: Diagnosis not present

## 2018-02-13 DIAGNOSIS — R05 Cough: Secondary | ICD-10-CM | POA: Diagnosis not present

## 2018-02-13 DIAGNOSIS — I509 Heart failure, unspecified: Secondary | ICD-10-CM | POA: Diagnosis not present

## 2018-02-16 DIAGNOSIS — F419 Anxiety disorder, unspecified: Secondary | ICD-10-CM | POA: Diagnosis not present

## 2018-02-16 DIAGNOSIS — R0602 Shortness of breath: Secondary | ICD-10-CM | POA: Diagnosis not present

## 2018-02-16 DIAGNOSIS — I10 Essential (primary) hypertension: Secondary | ICD-10-CM | POA: Diagnosis not present

## 2018-02-17 DIAGNOSIS — I509 Heart failure, unspecified: Secondary | ICD-10-CM | POA: Diagnosis not present

## 2018-02-17 DIAGNOSIS — S51801A Unspecified open wound of right forearm, initial encounter: Secondary | ICD-10-CM | POA: Diagnosis not present

## 2018-02-17 DIAGNOSIS — S51012A Laceration without foreign body of left elbow, initial encounter: Secondary | ICD-10-CM | POA: Diagnosis not present

## 2018-02-17 DIAGNOSIS — H109 Unspecified conjunctivitis: Secondary | ICD-10-CM | POA: Diagnosis not present

## 2018-02-17 DIAGNOSIS — R05 Cough: Secondary | ICD-10-CM | POA: Diagnosis not present

## 2018-02-20 DIAGNOSIS — I10 Essential (primary) hypertension: Secondary | ICD-10-CM | POA: Diagnosis not present

## 2018-02-22 DIAGNOSIS — B351 Tinea unguium: Secondary | ICD-10-CM | POA: Diagnosis not present

## 2018-02-22 DIAGNOSIS — Q845 Enlarged and hypertrophic nails: Secondary | ICD-10-CM | POA: Diagnosis not present

## 2018-02-22 DIAGNOSIS — I739 Peripheral vascular disease, unspecified: Secondary | ICD-10-CM | POA: Diagnosis not present

## 2018-02-22 DIAGNOSIS — L603 Nail dystrophy: Secondary | ICD-10-CM | POA: Diagnosis not present

## 2018-02-23 DIAGNOSIS — I509 Heart failure, unspecified: Secondary | ICD-10-CM | POA: Diagnosis not present

## 2018-02-23 DIAGNOSIS — F419 Anxiety disorder, unspecified: Secondary | ICD-10-CM | POA: Diagnosis not present

## 2018-03-08 ENCOUNTER — Ambulatory Visit: Payer: BLUE CROSS/BLUE SHIELD | Admitting: Physician Assistant

## 2018-03-09 DIAGNOSIS — F419 Anxiety disorder, unspecified: Secondary | ICD-10-CM | POA: Diagnosis not present

## 2018-03-16 DIAGNOSIS — F419 Anxiety disorder, unspecified: Secondary | ICD-10-CM | POA: Diagnosis not present

## 2018-03-20 DIAGNOSIS — I4891 Unspecified atrial fibrillation: Secondary | ICD-10-CM | POA: Diagnosis not present

## 2018-03-20 DIAGNOSIS — N183 Chronic kidney disease, stage 3 (moderate): Secondary | ICD-10-CM | POA: Diagnosis not present

## 2018-03-20 DIAGNOSIS — R05 Cough: Secondary | ICD-10-CM | POA: Diagnosis not present

## 2018-03-20 DIAGNOSIS — R3981 Functional urinary incontinence: Secondary | ICD-10-CM | POA: Diagnosis not present

## 2018-03-20 DIAGNOSIS — I10 Essential (primary) hypertension: Secondary | ICD-10-CM | POA: Diagnosis not present

## 2018-03-20 DIAGNOSIS — S51801A Unspecified open wound of right forearm, initial encounter: Secondary | ICD-10-CM | POA: Diagnosis not present

## 2018-03-20 DIAGNOSIS — J209 Acute bronchitis, unspecified: Secondary | ICD-10-CM | POA: Diagnosis not present

## 2018-03-20 DIAGNOSIS — R635 Abnormal weight gain: Secondary | ICD-10-CM | POA: Diagnosis not present

## 2018-03-20 DIAGNOSIS — Z7409 Other reduced mobility: Secondary | ICD-10-CM | POA: Diagnosis not present

## 2018-03-20 DIAGNOSIS — I5042 Chronic combined systolic (congestive) and diastolic (congestive) heart failure: Secondary | ICD-10-CM | POA: Diagnosis not present

## 2018-03-20 DIAGNOSIS — R062 Wheezing: Secondary | ICD-10-CM | POA: Diagnosis not present

## 2018-03-20 DIAGNOSIS — M6281 Muscle weakness (generalized): Secondary | ICD-10-CM | POA: Diagnosis not present

## 2018-03-21 ENCOUNTER — Ambulatory Visit: Payer: Self-pay | Admitting: Pharmacist Clinician (PhC)/ Clinical Pharmacy Specialist

## 2018-03-21 DIAGNOSIS — I341 Nonrheumatic mitral (valve) prolapse: Secondary | ICD-10-CM

## 2018-03-21 DIAGNOSIS — Z5181 Encounter for therapeutic drug level monitoring: Secondary | ICD-10-CM

## 2018-03-23 DIAGNOSIS — F419 Anxiety disorder, unspecified: Secondary | ICD-10-CM | POA: Diagnosis not present

## 2018-03-29 DIAGNOSIS — I63521 Cerebral infarction due to unspecified occlusion or stenosis of right anterior cerebral artery: Secondary | ICD-10-CM | POA: Diagnosis not present

## 2018-03-29 DIAGNOSIS — I69354 Hemiplegia and hemiparesis following cerebral infarction affecting left non-dominant side: Secondary | ICD-10-CM | POA: Diagnosis not present

## 2018-03-29 DIAGNOSIS — R1312 Dysphagia, oropharyngeal phase: Secondary | ICD-10-CM | POA: Diagnosis not present

## 2018-03-29 DIAGNOSIS — R278 Other lack of coordination: Secondary | ICD-10-CM | POA: Diagnosis not present

## 2018-03-29 DIAGNOSIS — J690 Pneumonitis due to inhalation of food and vomit: Secondary | ICD-10-CM | POA: Diagnosis not present

## 2018-03-29 DIAGNOSIS — M6281 Muscle weakness (generalized): Secondary | ICD-10-CM | POA: Diagnosis not present

## 2018-03-30 DIAGNOSIS — I69354 Hemiplegia and hemiparesis following cerebral infarction affecting left non-dominant side: Secondary | ICD-10-CM | POA: Diagnosis not present

## 2018-03-30 DIAGNOSIS — J690 Pneumonitis due to inhalation of food and vomit: Secondary | ICD-10-CM | POA: Diagnosis not present

## 2018-03-30 DIAGNOSIS — R278 Other lack of coordination: Secondary | ICD-10-CM | POA: Diagnosis not present

## 2018-03-30 DIAGNOSIS — M6281 Muscle weakness (generalized): Secondary | ICD-10-CM | POA: Diagnosis not present

## 2018-03-30 DIAGNOSIS — I63521 Cerebral infarction due to unspecified occlusion or stenosis of right anterior cerebral artery: Secondary | ICD-10-CM | POA: Diagnosis not present

## 2018-03-30 DIAGNOSIS — R1312 Dysphagia, oropharyngeal phase: Secondary | ICD-10-CM | POA: Diagnosis not present

## 2018-03-31 DIAGNOSIS — M6281 Muscle weakness (generalized): Secondary | ICD-10-CM | POA: Diagnosis not present

## 2018-03-31 DIAGNOSIS — R278 Other lack of coordination: Secondary | ICD-10-CM | POA: Diagnosis not present

## 2018-03-31 DIAGNOSIS — R1312 Dysphagia, oropharyngeal phase: Secondary | ICD-10-CM | POA: Diagnosis not present

## 2018-03-31 DIAGNOSIS — I63521 Cerebral infarction due to unspecified occlusion or stenosis of right anterior cerebral artery: Secondary | ICD-10-CM | POA: Diagnosis not present

## 2018-03-31 DIAGNOSIS — J690 Pneumonitis due to inhalation of food and vomit: Secondary | ICD-10-CM | POA: Diagnosis not present

## 2018-03-31 DIAGNOSIS — I69354 Hemiplegia and hemiparesis following cerebral infarction affecting left non-dominant side: Secondary | ICD-10-CM | POA: Diagnosis not present

## 2018-04-03 DIAGNOSIS — I69354 Hemiplegia and hemiparesis following cerebral infarction affecting left non-dominant side: Secondary | ICD-10-CM | POA: Diagnosis not present

## 2018-04-03 DIAGNOSIS — R1312 Dysphagia, oropharyngeal phase: Secondary | ICD-10-CM | POA: Diagnosis not present

## 2018-04-03 DIAGNOSIS — R278 Other lack of coordination: Secondary | ICD-10-CM | POA: Diagnosis not present

## 2018-04-03 DIAGNOSIS — M6281 Muscle weakness (generalized): Secondary | ICD-10-CM | POA: Diagnosis not present

## 2018-04-03 DIAGNOSIS — I63521 Cerebral infarction due to unspecified occlusion or stenosis of right anterior cerebral artery: Secondary | ICD-10-CM | POA: Diagnosis not present

## 2018-04-03 DIAGNOSIS — J690 Pneumonitis due to inhalation of food and vomit: Secondary | ICD-10-CM | POA: Diagnosis not present

## 2018-04-04 DIAGNOSIS — I63521 Cerebral infarction due to unspecified occlusion or stenosis of right anterior cerebral artery: Secondary | ICD-10-CM | POA: Diagnosis not present

## 2018-04-04 DIAGNOSIS — J690 Pneumonitis due to inhalation of food and vomit: Secondary | ICD-10-CM | POA: Diagnosis not present

## 2018-04-04 DIAGNOSIS — I69354 Hemiplegia and hemiparesis following cerebral infarction affecting left non-dominant side: Secondary | ICD-10-CM | POA: Diagnosis not present

## 2018-04-04 DIAGNOSIS — M6281 Muscle weakness (generalized): Secondary | ICD-10-CM | POA: Diagnosis not present

## 2018-04-04 DIAGNOSIS — R1312 Dysphagia, oropharyngeal phase: Secondary | ICD-10-CM | POA: Diagnosis not present

## 2018-04-04 DIAGNOSIS — R278 Other lack of coordination: Secondary | ICD-10-CM | POA: Diagnosis not present

## 2018-04-05 DIAGNOSIS — I63521 Cerebral infarction due to unspecified occlusion or stenosis of right anterior cerebral artery: Secondary | ICD-10-CM | POA: Diagnosis not present

## 2018-04-05 DIAGNOSIS — M6281 Muscle weakness (generalized): Secondary | ICD-10-CM | POA: Diagnosis not present

## 2018-04-05 DIAGNOSIS — R278 Other lack of coordination: Secondary | ICD-10-CM | POA: Diagnosis not present

## 2018-04-05 DIAGNOSIS — R1312 Dysphagia, oropharyngeal phase: Secondary | ICD-10-CM | POA: Diagnosis not present

## 2018-04-05 DIAGNOSIS — F4322 Adjustment disorder with anxiety: Secondary | ICD-10-CM | POA: Diagnosis not present

## 2018-04-05 DIAGNOSIS — I69354 Hemiplegia and hemiparesis following cerebral infarction affecting left non-dominant side: Secondary | ICD-10-CM | POA: Diagnosis not present

## 2018-04-05 DIAGNOSIS — J690 Pneumonitis due to inhalation of food and vomit: Secondary | ICD-10-CM | POA: Diagnosis not present

## 2018-04-06 DIAGNOSIS — R278 Other lack of coordination: Secondary | ICD-10-CM | POA: Diagnosis not present

## 2018-04-06 DIAGNOSIS — J690 Pneumonitis due to inhalation of food and vomit: Secondary | ICD-10-CM | POA: Diagnosis not present

## 2018-04-06 DIAGNOSIS — F419 Anxiety disorder, unspecified: Secondary | ICD-10-CM | POA: Diagnosis not present

## 2018-04-06 DIAGNOSIS — I69354 Hemiplegia and hemiparesis following cerebral infarction affecting left non-dominant side: Secondary | ICD-10-CM | POA: Diagnosis not present

## 2018-04-06 DIAGNOSIS — M6281 Muscle weakness (generalized): Secondary | ICD-10-CM | POA: Diagnosis not present

## 2018-04-06 DIAGNOSIS — R1312 Dysphagia, oropharyngeal phase: Secondary | ICD-10-CM | POA: Diagnosis not present

## 2018-04-06 DIAGNOSIS — I63521 Cerebral infarction due to unspecified occlusion or stenosis of right anterior cerebral artery: Secondary | ICD-10-CM | POA: Diagnosis not present

## 2018-04-07 DIAGNOSIS — J690 Pneumonitis due to inhalation of food and vomit: Secondary | ICD-10-CM | POA: Diagnosis not present

## 2018-04-07 DIAGNOSIS — M6281 Muscle weakness (generalized): Secondary | ICD-10-CM | POA: Diagnosis not present

## 2018-04-07 DIAGNOSIS — I63521 Cerebral infarction due to unspecified occlusion or stenosis of right anterior cerebral artery: Secondary | ICD-10-CM | POA: Diagnosis not present

## 2018-04-07 DIAGNOSIS — I69354 Hemiplegia and hemiparesis following cerebral infarction affecting left non-dominant side: Secondary | ICD-10-CM | POA: Diagnosis not present

## 2018-04-07 DIAGNOSIS — R278 Other lack of coordination: Secondary | ICD-10-CM | POA: Diagnosis not present

## 2018-04-07 DIAGNOSIS — R1312 Dysphagia, oropharyngeal phase: Secondary | ICD-10-CM | POA: Diagnosis not present

## 2018-04-10 DIAGNOSIS — R278 Other lack of coordination: Secondary | ICD-10-CM | POA: Diagnosis not present

## 2018-04-10 DIAGNOSIS — R1312 Dysphagia, oropharyngeal phase: Secondary | ICD-10-CM | POA: Diagnosis not present

## 2018-04-10 DIAGNOSIS — I63521 Cerebral infarction due to unspecified occlusion or stenosis of right anterior cerebral artery: Secondary | ICD-10-CM | POA: Diagnosis not present

## 2018-04-10 DIAGNOSIS — I69354 Hemiplegia and hemiparesis following cerebral infarction affecting left non-dominant side: Secondary | ICD-10-CM | POA: Diagnosis not present

## 2018-04-10 DIAGNOSIS — M6281 Muscle weakness (generalized): Secondary | ICD-10-CM | POA: Diagnosis not present

## 2018-04-10 DIAGNOSIS — J690 Pneumonitis due to inhalation of food and vomit: Secondary | ICD-10-CM | POA: Diagnosis not present

## 2018-04-11 DIAGNOSIS — M6281 Muscle weakness (generalized): Secondary | ICD-10-CM | POA: Diagnosis not present

## 2018-04-11 DIAGNOSIS — R278 Other lack of coordination: Secondary | ICD-10-CM | POA: Diagnosis not present

## 2018-04-11 DIAGNOSIS — J690 Pneumonitis due to inhalation of food and vomit: Secondary | ICD-10-CM | POA: Diagnosis not present

## 2018-04-11 DIAGNOSIS — R1312 Dysphagia, oropharyngeal phase: Secondary | ICD-10-CM | POA: Diagnosis not present

## 2018-04-11 DIAGNOSIS — I63521 Cerebral infarction due to unspecified occlusion or stenosis of right anterior cerebral artery: Secondary | ICD-10-CM | POA: Diagnosis not present

## 2018-04-11 DIAGNOSIS — I69354 Hemiplegia and hemiparesis following cerebral infarction affecting left non-dominant side: Secondary | ICD-10-CM | POA: Diagnosis not present

## 2018-04-13 DIAGNOSIS — F419 Anxiety disorder, unspecified: Secondary | ICD-10-CM | POA: Diagnosis not present

## 2018-04-20 DIAGNOSIS — I63521 Cerebral infarction due to unspecified occlusion or stenosis of right anterior cerebral artery: Secondary | ICD-10-CM | POA: Diagnosis not present

## 2018-04-20 DIAGNOSIS — M6281 Muscle weakness (generalized): Secondary | ICD-10-CM | POA: Diagnosis not present

## 2018-04-20 DIAGNOSIS — R278 Other lack of coordination: Secondary | ICD-10-CM | POA: Diagnosis not present

## 2018-04-20 DIAGNOSIS — I69354 Hemiplegia and hemiparesis following cerebral infarction affecting left non-dominant side: Secondary | ICD-10-CM | POA: Diagnosis not present

## 2018-04-20 DIAGNOSIS — F419 Anxiety disorder, unspecified: Secondary | ICD-10-CM | POA: Diagnosis not present

## 2018-04-20 DIAGNOSIS — J690 Pneumonitis due to inhalation of food and vomit: Secondary | ICD-10-CM | POA: Diagnosis not present

## 2018-04-20 DIAGNOSIS — R1312 Dysphagia, oropharyngeal phase: Secondary | ICD-10-CM | POA: Diagnosis not present

## 2018-04-21 DIAGNOSIS — J690 Pneumonitis due to inhalation of food and vomit: Secondary | ICD-10-CM | POA: Diagnosis not present

## 2018-04-21 DIAGNOSIS — I69354 Hemiplegia and hemiparesis following cerebral infarction affecting left non-dominant side: Secondary | ICD-10-CM | POA: Diagnosis not present

## 2018-04-21 DIAGNOSIS — M6281 Muscle weakness (generalized): Secondary | ICD-10-CM | POA: Diagnosis not present

## 2018-04-21 DIAGNOSIS — I63521 Cerebral infarction due to unspecified occlusion or stenosis of right anterior cerebral artery: Secondary | ICD-10-CM | POA: Diagnosis not present

## 2018-04-21 DIAGNOSIS — R278 Other lack of coordination: Secondary | ICD-10-CM | POA: Diagnosis not present

## 2018-04-21 DIAGNOSIS — R1312 Dysphagia, oropharyngeal phase: Secondary | ICD-10-CM | POA: Diagnosis not present

## 2018-04-23 DIAGNOSIS — I480 Paroxysmal atrial fibrillation: Secondary | ICD-10-CM | POA: Diagnosis not present

## 2018-04-23 DIAGNOSIS — N183 Chronic kidney disease, stage 3 (moderate): Secondary | ICD-10-CM | POA: Diagnosis not present

## 2018-04-23 DIAGNOSIS — I5022 Chronic systolic (congestive) heart failure: Secondary | ICD-10-CM | POA: Diagnosis not present

## 2018-04-23 DIAGNOSIS — I2510 Atherosclerotic heart disease of native coronary artery without angina pectoris: Secondary | ICD-10-CM | POA: Diagnosis not present

## 2018-04-23 DIAGNOSIS — I69354 Hemiplegia and hemiparesis following cerebral infarction affecting left non-dominant side: Secondary | ICD-10-CM | POA: Diagnosis not present

## 2018-04-23 DIAGNOSIS — I130 Hypertensive heart and chronic kidney disease with heart failure and stage 1 through stage 4 chronic kidney disease, or unspecified chronic kidney disease: Secondary | ICD-10-CM | POA: Diagnosis not present

## 2018-04-24 ENCOUNTER — Telehealth: Payer: Self-pay | Admitting: Cardiology

## 2018-04-24 DIAGNOSIS — N183 Chronic kidney disease, stage 3 (moderate): Secondary | ICD-10-CM | POA: Diagnosis not present

## 2018-04-24 DIAGNOSIS — I480 Paroxysmal atrial fibrillation: Secondary | ICD-10-CM | POA: Diagnosis not present

## 2018-04-24 DIAGNOSIS — I2510 Atherosclerotic heart disease of native coronary artery without angina pectoris: Secondary | ICD-10-CM | POA: Diagnosis not present

## 2018-04-24 DIAGNOSIS — I69354 Hemiplegia and hemiparesis following cerebral infarction affecting left non-dominant side: Secondary | ICD-10-CM | POA: Diagnosis not present

## 2018-04-24 DIAGNOSIS — I5022 Chronic systolic (congestive) heart failure: Secondary | ICD-10-CM | POA: Diagnosis not present

## 2018-04-24 DIAGNOSIS — I130 Hypertensive heart and chronic kidney disease with heart failure and stage 1 through stage 4 chronic kidney disease, or unspecified chronic kidney disease: Secondary | ICD-10-CM | POA: Diagnosis not present

## 2018-04-24 NOTE — Telephone Encounter (Signed)
Returned call to Will with [facility] Dr.Jordan advised ok to order OT.

## 2018-04-24 NOTE — Telephone Encounter (Signed)
And order PT  Peter Martinique MD, [facility]

## 2018-04-24 NOTE — Telephone Encounter (Signed)
New Message   Per Will  Will need verbal authorization on 1 time week 1st week, 2 times for 2nd week, 1 time a week for 3rd week, 2 times a week for 3 weeks. Will fax information as well.

## 2018-04-24 NOTE — Telephone Encounter (Signed)
° ° °  Joaquim Lai from [facility] calling to request additional PT visits,  Twice a week for 4 weeks  Please call with verbal order [phone]

## 2018-04-24 NOTE — Telephone Encounter (Signed)
Spoke with Will from [facility] who states they also need OT verbal order for; Week 1- 1 time a week Week 2- 2 times a week Week 3-  1 time a week Then 2 times a week for 3 week. Will route to MD

## 2018-04-24 NOTE — Telephone Encounter (Signed)
OK to order OT   Martinique MD, [facility]

## 2018-04-24 NOTE — Telephone Encounter (Signed)
Spoke with Joaquim Lai from [facility] who states she is needing verbal order for PT twice a week for 4 weeks. Will route to MD

## 2018-04-24 NOTE — Telephone Encounter (Signed)
Returned call to [place] with [facility] Dr.Jordan advised ok to order PT.

## 2018-04-24 NOTE — Telephone Encounter (Signed)
OK to order OT  Peter Martinique MD, [facility]

## 2018-04-27 DIAGNOSIS — I69354 Hemiplegia and hemiparesis following cerebral infarction affecting left non-dominant side: Secondary | ICD-10-CM | POA: Diagnosis not present

## 2018-04-27 DIAGNOSIS — I5022 Chronic systolic (congestive) heart failure: Secondary | ICD-10-CM | POA: Diagnosis not present

## 2018-04-27 DIAGNOSIS — I2510 Atherosclerotic heart disease of native coronary artery without angina pectoris: Secondary | ICD-10-CM | POA: Diagnosis not present

## 2018-04-27 DIAGNOSIS — I130 Hypertensive heart and chronic kidney disease with heart failure and stage 1 through stage 4 chronic kidney disease, or unspecified chronic kidney disease: Secondary | ICD-10-CM | POA: Diagnosis not present

## 2018-04-27 DIAGNOSIS — N183 Chronic kidney disease, stage 3 (moderate): Secondary | ICD-10-CM | POA: Diagnosis not present

## 2018-04-27 DIAGNOSIS — I480 Paroxysmal atrial fibrillation: Secondary | ICD-10-CM | POA: Diagnosis not present

## 2018-04-28 DIAGNOSIS — I5022 Chronic systolic (congestive) heart failure: Secondary | ICD-10-CM | POA: Diagnosis not present

## 2018-04-28 DIAGNOSIS — I69354 Hemiplegia and hemiparesis following cerebral infarction affecting left non-dominant side: Secondary | ICD-10-CM | POA: Diagnosis not present

## 2018-04-28 DIAGNOSIS — I130 Hypertensive heart and chronic kidney disease with heart failure and stage 1 through stage 4 chronic kidney disease, or unspecified chronic kidney disease: Secondary | ICD-10-CM | POA: Diagnosis not present

## 2018-04-28 DIAGNOSIS — I2510 Atherosclerotic heart disease of native coronary artery without angina pectoris: Secondary | ICD-10-CM | POA: Diagnosis not present

## 2018-04-28 DIAGNOSIS — I480 Paroxysmal atrial fibrillation: Secondary | ICD-10-CM | POA: Diagnosis not present

## 2018-04-28 DIAGNOSIS — N183 Chronic kidney disease, stage 3 (moderate): Secondary | ICD-10-CM | POA: Diagnosis not present

## 2018-05-01 DIAGNOSIS — I480 Paroxysmal atrial fibrillation: Secondary | ICD-10-CM | POA: Diagnosis not present

## 2018-05-01 DIAGNOSIS — I130 Hypertensive heart and chronic kidney disease with heart failure and stage 1 through stage 4 chronic kidney disease, or unspecified chronic kidney disease: Secondary | ICD-10-CM | POA: Diagnosis not present

## 2018-05-01 DIAGNOSIS — I69354 Hemiplegia and hemiparesis following cerebral infarction affecting left non-dominant side: Secondary | ICD-10-CM | POA: Diagnosis not present

## 2018-05-01 DIAGNOSIS — I5022 Chronic systolic (congestive) heart failure: Secondary | ICD-10-CM | POA: Diagnosis not present

## 2018-05-01 DIAGNOSIS — N183 Chronic kidney disease, stage 3 (moderate): Secondary | ICD-10-CM | POA: Diagnosis not present

## 2018-05-01 DIAGNOSIS — I2510 Atherosclerotic heart disease of native coronary artery without angina pectoris: Secondary | ICD-10-CM | POA: Diagnosis not present

## 2018-05-02 DIAGNOSIS — I5022 Chronic systolic (congestive) heart failure: Secondary | ICD-10-CM | POA: Diagnosis not present

## 2018-05-02 DIAGNOSIS — N183 Chronic kidney disease, stage 3 (moderate): Secondary | ICD-10-CM | POA: Diagnosis not present

## 2018-05-02 DIAGNOSIS — I480 Paroxysmal atrial fibrillation: Secondary | ICD-10-CM | POA: Diagnosis not present

## 2018-05-02 DIAGNOSIS — I2510 Atherosclerotic heart disease of native coronary artery without angina pectoris: Secondary | ICD-10-CM | POA: Diagnosis not present

## 2018-05-02 DIAGNOSIS — I69354 Hemiplegia and hemiparesis following cerebral infarction affecting left non-dominant side: Secondary | ICD-10-CM | POA: Diagnosis not present

## 2018-05-02 DIAGNOSIS — I130 Hypertensive heart and chronic kidney disease with heart failure and stage 1 through stage 4 chronic kidney disease, or unspecified chronic kidney disease: Secondary | ICD-10-CM | POA: Diagnosis not present

## 2018-05-03 ENCOUNTER — Other Ambulatory Visit: Payer: Self-pay | Admitting: Cardiology

## 2018-05-03 DIAGNOSIS — I130 Hypertensive heart and chronic kidney disease with heart failure and stage 1 through stage 4 chronic kidney disease, or unspecified chronic kidney disease: Secondary | ICD-10-CM | POA: Diagnosis not present

## 2018-05-03 DIAGNOSIS — I480 Paroxysmal atrial fibrillation: Secondary | ICD-10-CM | POA: Diagnosis not present

## 2018-05-03 DIAGNOSIS — I5022 Chronic systolic (congestive) heart failure: Secondary | ICD-10-CM | POA: Diagnosis not present

## 2018-05-03 DIAGNOSIS — N183 Chronic kidney disease, stage 3 (moderate): Secondary | ICD-10-CM | POA: Diagnosis not present

## 2018-05-03 DIAGNOSIS — I69354 Hemiplegia and hemiparesis following cerebral infarction affecting left non-dominant side: Secondary | ICD-10-CM | POA: Diagnosis not present

## 2018-05-03 DIAGNOSIS — I2510 Atherosclerotic heart disease of native coronary artery without angina pectoris: Secondary | ICD-10-CM | POA: Diagnosis not present

## 2018-05-03 MED ORDER — RANITIDINE HCL 150 MG PO TABS: 150.0000 mg | ORAL_TABLET | Freq: Two times a day (BID) | ORAL | 0 refills | Status: DC

## 2018-05-03 MED ORDER — MONTELUKAST SODIUM 10 MG PO TABS: 10.0000 mg | ORAL_TABLET | Freq: Every day | ORAL | 0 refills | Status: DC

## 2018-05-03 MED ORDER — K PHOS MONO-SOD PHOS DI & MONO 155-852-130 MG PO TABS: 250.0000 mg | ORAL_TABLET | Freq: Every day | ORAL | 0 refills | Status: DC

## 2018-05-03 MED ORDER — ROSUVASTATIN CALCIUM 20 MG PO TABS: 20.0000 mg | ORAL_TABLET | Freq: Every day | ORAL | 0 refills | Status: DC

## 2018-05-03 MED ORDER — BENAZEPRIL HCL 5 MG PO TABS: 5.0000 mg | ORAL_TABLET | Freq: Every day | ORAL | 0 refills | Status: DC

## 2018-05-03 MED ORDER — PANTOPRAZOLE SODIUM 40 MG PO TBEC: 40.0000 mg | DELAYED_RELEASE_TABLET | Freq: Every day | ORAL | 0 refills | Status: DC

## 2018-05-03 MED ORDER — APIXABAN 5 MG PO TABS: 5.0000 mg | ORAL_TABLET | Freq: Two times a day (BID) | ORAL | 1 refills | Status: DC

## 2018-05-03 NOTE — Telephone Encounter (Signed)
°*  STAT* If patient is at the pharmacy, call can be transferred to refill team.   1. Which medications need to be refilled? (please list name of each medication and dose if known) Need new prescriptions for Rosuvastatin, Benazepril, Singulair, Eliquis,Zantac and Phospha, Protonix, HCTZ 2. Which pharmacy/location (including street and city if local pharmacy) is medication to be sent to? [facility] [phone]  3. Do they need a 30 day or 90 day supply? Rosuvastatin #90 and refills Benazpril-#90 and refills Singular #90 and refill Phospha #90 and refills Eliquis 180 and refills, Zantac 180 and refills- Protonix 90 and refills, HCTZ 90 and refills

## 2018-05-04 DIAGNOSIS — I480 Paroxysmal atrial fibrillation: Secondary | ICD-10-CM | POA: Diagnosis not present

## 2018-05-04 DIAGNOSIS — I69354 Hemiplegia and hemiparesis following cerebral infarction affecting left non-dominant side: Secondary | ICD-10-CM | POA: Diagnosis not present

## 2018-05-04 DIAGNOSIS — N183 Chronic kidney disease, stage 3 (moderate): Secondary | ICD-10-CM | POA: Diagnosis not present

## 2018-05-04 DIAGNOSIS — I130 Hypertensive heart and chronic kidney disease with heart failure and stage 1 through stage 4 chronic kidney disease, or unspecified chronic kidney disease: Secondary | ICD-10-CM | POA: Diagnosis not present

## 2018-05-04 DIAGNOSIS — I5022 Chronic systolic (congestive) heart failure: Secondary | ICD-10-CM | POA: Diagnosis not present

## 2018-05-04 DIAGNOSIS — I2510 Atherosclerotic heart disease of native coronary artery without angina pectoris: Secondary | ICD-10-CM | POA: Diagnosis not present

## 2018-05-08 DIAGNOSIS — I5022 Chronic systolic (congestive) heart failure: Secondary | ICD-10-CM | POA: Diagnosis not present

## 2018-05-08 DIAGNOSIS — I2510 Atherosclerotic heart disease of native coronary artery without angina pectoris: Secondary | ICD-10-CM | POA: Diagnosis not present

## 2018-05-08 DIAGNOSIS — I480 Paroxysmal atrial fibrillation: Secondary | ICD-10-CM | POA: Diagnosis not present

## 2018-05-08 DIAGNOSIS — I130 Hypertensive heart and chronic kidney disease with heart failure and stage 1 through stage 4 chronic kidney disease, or unspecified chronic kidney disease: Secondary | ICD-10-CM | POA: Diagnosis not present

## 2018-05-08 DIAGNOSIS — I69354 Hemiplegia and hemiparesis following cerebral infarction affecting left non-dominant side: Secondary | ICD-10-CM | POA: Diagnosis not present

## 2018-05-08 DIAGNOSIS — N183 Chronic kidney disease, stage 3 (moderate): Secondary | ICD-10-CM | POA: Diagnosis not present

## 2018-05-09 DIAGNOSIS — I69354 Hemiplegia and hemiparesis following cerebral infarction affecting left non-dominant side: Secondary | ICD-10-CM | POA: Diagnosis not present

## 2018-05-09 DIAGNOSIS — I480 Paroxysmal atrial fibrillation: Secondary | ICD-10-CM | POA: Diagnosis not present

## 2018-05-09 DIAGNOSIS — I130 Hypertensive heart and chronic kidney disease with heart failure and stage 1 through stage 4 chronic kidney disease, or unspecified chronic kidney disease: Secondary | ICD-10-CM | POA: Diagnosis not present

## 2018-05-09 DIAGNOSIS — N183 Chronic kidney disease, stage 3 (moderate): Secondary | ICD-10-CM | POA: Diagnosis not present

## 2018-05-09 DIAGNOSIS — I5022 Chronic systolic (congestive) heart failure: Secondary | ICD-10-CM | POA: Diagnosis not present

## 2018-05-09 DIAGNOSIS — I2510 Atherosclerotic heart disease of native coronary artery without angina pectoris: Secondary | ICD-10-CM | POA: Diagnosis not present

## 2018-05-10 DIAGNOSIS — I5022 Chronic systolic (congestive) heart failure: Secondary | ICD-10-CM | POA: Diagnosis not present

## 2018-05-10 DIAGNOSIS — I69354 Hemiplegia and hemiparesis following cerebral infarction affecting left non-dominant side: Secondary | ICD-10-CM | POA: Diagnosis not present

## 2018-05-10 DIAGNOSIS — I2510 Atherosclerotic heart disease of native coronary artery without angina pectoris: Secondary | ICD-10-CM | POA: Diagnosis not present

## 2018-05-10 DIAGNOSIS — I480 Paroxysmal atrial fibrillation: Secondary | ICD-10-CM | POA: Diagnosis not present

## 2018-05-10 DIAGNOSIS — I130 Hypertensive heart and chronic kidney disease with heart failure and stage 1 through stage 4 chronic kidney disease, or unspecified chronic kidney disease: Secondary | ICD-10-CM | POA: Diagnosis not present

## 2018-05-10 DIAGNOSIS — N183 Chronic kidney disease, stage 3 (moderate): Secondary | ICD-10-CM | POA: Diagnosis not present

## 2018-05-12 DIAGNOSIS — I130 Hypertensive heart and chronic kidney disease with heart failure and stage 1 through stage 4 chronic kidney disease, or unspecified chronic kidney disease: Secondary | ICD-10-CM | POA: Diagnosis not present

## 2018-05-12 DIAGNOSIS — I2510 Atherosclerotic heart disease of native coronary artery without angina pectoris: Secondary | ICD-10-CM | POA: Diagnosis not present

## 2018-05-12 DIAGNOSIS — I480 Paroxysmal atrial fibrillation: Secondary | ICD-10-CM | POA: Diagnosis not present

## 2018-05-12 DIAGNOSIS — N183 Chronic kidney disease, stage 3 (moderate): Secondary | ICD-10-CM | POA: Diagnosis not present

## 2018-05-12 DIAGNOSIS — I5022 Chronic systolic (congestive) heart failure: Secondary | ICD-10-CM | POA: Diagnosis not present

## 2018-05-12 DIAGNOSIS — I69354 Hemiplegia and hemiparesis following cerebral infarction affecting left non-dominant side: Secondary | ICD-10-CM | POA: Diagnosis not present

## 2018-05-15 DIAGNOSIS — I5022 Chronic systolic (congestive) heart failure: Secondary | ICD-10-CM | POA: Diagnosis not present

## 2018-05-15 DIAGNOSIS — I130 Hypertensive heart and chronic kidney disease with heart failure and stage 1 through stage 4 chronic kidney disease, or unspecified chronic kidney disease: Secondary | ICD-10-CM | POA: Diagnosis not present

## 2018-05-15 DIAGNOSIS — I480 Paroxysmal atrial fibrillation: Secondary | ICD-10-CM | POA: Diagnosis not present

## 2018-05-15 DIAGNOSIS — I69354 Hemiplegia and hemiparesis following cerebral infarction affecting left non-dominant side: Secondary | ICD-10-CM | POA: Diagnosis not present

## 2018-05-15 DIAGNOSIS — I2510 Atherosclerotic heart disease of native coronary artery without angina pectoris: Secondary | ICD-10-CM | POA: Diagnosis not present

## 2018-05-15 DIAGNOSIS — N183 Chronic kidney disease, stage 3 (moderate): Secondary | ICD-10-CM | POA: Diagnosis not present

## 2018-05-16 DIAGNOSIS — N183 Chronic kidney disease, stage 3 (moderate): Secondary | ICD-10-CM | POA: Diagnosis not present

## 2018-05-16 DIAGNOSIS — I2510 Atherosclerotic heart disease of native coronary artery without angina pectoris: Secondary | ICD-10-CM | POA: Diagnosis not present

## 2018-05-16 DIAGNOSIS — I69354 Hemiplegia and hemiparesis following cerebral infarction affecting left non-dominant side: Secondary | ICD-10-CM | POA: Diagnosis not present

## 2018-05-16 DIAGNOSIS — I5022 Chronic systolic (congestive) heart failure: Secondary | ICD-10-CM | POA: Diagnosis not present

## 2018-05-16 DIAGNOSIS — I480 Paroxysmal atrial fibrillation: Secondary | ICD-10-CM | POA: Diagnosis not present

## 2018-05-16 DIAGNOSIS — I130 Hypertensive heart and chronic kidney disease with heart failure and stage 1 through stage 4 chronic kidney disease, or unspecified chronic kidney disease: Secondary | ICD-10-CM | POA: Diagnosis not present

## 2018-05-17 ENCOUNTER — Telehealth: Payer: Self-pay | Admitting: Cardiology

## 2018-05-17 DIAGNOSIS — I2510 Atherosclerotic heart disease of native coronary artery without angina pectoris: Secondary | ICD-10-CM | POA: Diagnosis not present

## 2018-05-17 DIAGNOSIS — I69354 Hemiplegia and hemiparesis following cerebral infarction affecting left non-dominant side: Secondary | ICD-10-CM | POA: Diagnosis not present

## 2018-05-17 DIAGNOSIS — I130 Hypertensive heart and chronic kidney disease with heart failure and stage 1 through stage 4 chronic kidney disease, or unspecified chronic kidney disease: Secondary | ICD-10-CM | POA: Diagnosis not present

## 2018-05-17 DIAGNOSIS — N183 Chronic kidney disease, stage 3 (moderate): Secondary | ICD-10-CM | POA: Diagnosis not present

## 2018-05-17 DIAGNOSIS — I5022 Chronic systolic (congestive) heart failure: Secondary | ICD-10-CM | POA: Diagnosis not present

## 2018-05-17 DIAGNOSIS — I480 Paroxysmal atrial fibrillation: Secondary | ICD-10-CM | POA: Diagnosis not present

## 2018-05-17 NOTE — Telephone Encounter (Signed)
New Message   Pt c/o medication issue:  1. Name of Medication: hydrochlorothiazide  2. How are you currently taking this medication (dosage and times per day)? n/a  3. Are you having a reaction (difficulty breathing--STAT)? n/a  4. What is your medication issue? Pt's wife is calling states the pt was on hydrochlorothiazide in the nursing home but since he has been discharged he was not prescribed the medication. So they want to know if he should still be taking it. Please call

## 2018-05-17 NOTE — Telephone Encounter (Signed)
Received call back from patient's wife Dr.Jordan's recommendation given.Stated husband is feeling better since discharged from [facility].Advised to keep appointment with Dr.Jordan 05/30/18 at 2:20 pm.

## 2018-05-17 NOTE — Telephone Encounter (Signed)
Mucinex is only for cough so he doesn't need this. Hctz is for BP so if BP is well controlled he may not need this as well.  Arthur Barrick Martinique MD, [facility]

## 2018-05-17 NOTE — Telephone Encounter (Signed)
Returned call to patient's wife no answer.LMTC. 

## 2018-05-17 NOTE — Telephone Encounter (Signed)
Returned call to wife. Patient was in [place] and was put on mucinex and hctz when he was in the nursing home center for respiratory issues - she states he was there for like 9 months. He was discharged but was not given Rx for these medications. He has been without hctz for several days. Wife states that [facility] was supposed to send a med list over for MD to review.   Per wife, his last CXR was normal and lungs were clear on most recent assessment by nurse at [facility].  She is wanting to know if Dr. Martinique thinks he should continue mucinex & hctz. Patient has no weight gain or swelling. Advised that MD has not assessed patient since last visit in June but will send him the message to review and advise if possible. Patient has OV on 12/17

## 2018-05-18 ENCOUNTER — Telehealth: Payer: Self-pay | Admitting: Cardiology

## 2018-05-18 DIAGNOSIS — I2510 Atherosclerotic heart disease of native coronary artery without angina pectoris: Secondary | ICD-10-CM | POA: Diagnosis not present

## 2018-05-18 DIAGNOSIS — N183 Chronic kidney disease, stage 3 (moderate): Secondary | ICD-10-CM | POA: Diagnosis not present

## 2018-05-18 DIAGNOSIS — I5022 Chronic systolic (congestive) heart failure: Secondary | ICD-10-CM | POA: Diagnosis not present

## 2018-05-18 DIAGNOSIS — I69354 Hemiplegia and hemiparesis following cerebral infarction affecting left non-dominant side: Secondary | ICD-10-CM | POA: Diagnosis not present

## 2018-05-18 DIAGNOSIS — I130 Hypertensive heart and chronic kidney disease with heart failure and stage 1 through stage 4 chronic kidney disease, or unspecified chronic kidney disease: Secondary | ICD-10-CM | POA: Diagnosis not present

## 2018-05-18 DIAGNOSIS — I480 Paroxysmal atrial fibrillation: Secondary | ICD-10-CM | POA: Diagnosis not present

## 2018-05-18 NOTE — Telephone Encounter (Signed)
Ok to extend therapy 3 more weeks.  Rayshaun Needle Martinique MD, [facility]

## 2018-05-18 NOTE — Telephone Encounter (Signed)
Returned call to [place] with [facility].Dr.Jordan advised ok to extend therapy 3 more weeks.

## 2018-05-18 NOTE — Telephone Encounter (Signed)
New Message:   [name] physical therapist  with [facility] would like to extend patient therapy  for three more weeks.

## 2018-05-21 ENCOUNTER — Inpatient Hospital Stay (HOSPITAL_COMMUNITY)
Admission: EM | Admit: 2018-05-21 | Discharge: 2018-05-25 | DRG: 872 | Disposition: A | Discharge status: Home Health | Payer: Medicare Other | Attending: Internal Medicine | Admitting: Internal Medicine

## 2018-05-21 ENCOUNTER — Emergency Department (HOSPITAL_COMMUNITY): Payer: Medicare Other

## 2018-05-21 DIAGNOSIS — E872 Acidosis, unspecified: Secondary | ICD-10-CM

## 2018-05-21 DIAGNOSIS — R5381 Other malaise: Secondary | ICD-10-CM | POA: Diagnosis present

## 2018-05-21 DIAGNOSIS — I2510 Atherosclerotic heart disease of native coronary artery without angina pectoris: Secondary | ICD-10-CM | POA: Diagnosis present

## 2018-05-21 DIAGNOSIS — R000 Tachycardia, unspecified: Secondary | ICD-10-CM | POA: Diagnosis not present

## 2018-05-21 DIAGNOSIS — I130 Hypertensive heart and chronic kidney disease with heart failure and stage 1 through stage 4 chronic kidney disease, or unspecified chronic kidney disease: Secondary | ICD-10-CM | POA: Diagnosis present

## 2018-05-21 DIAGNOSIS — Z87891 Personal history of nicotine dependence: Secondary | ICD-10-CM | POA: Diagnosis not present

## 2018-05-21 DIAGNOSIS — N12 Tubulo-interstitial nephritis, not specified as acute or chronic: Secondary | ICD-10-CM | POA: Diagnosis present

## 2018-05-21 DIAGNOSIS — R059 Cough, unspecified: Secondary | ICD-10-CM

## 2018-05-21 DIAGNOSIS — R7881 Bacteremia: Secondary | ICD-10-CM | POA: Diagnosis not present

## 2018-05-21 DIAGNOSIS — R531 Weakness: Secondary | ICD-10-CM | POA: Diagnosis not present

## 2018-05-21 DIAGNOSIS — Z8249 Family history of ischemic heart disease and other diseases of the circulatory system: Secondary | ICD-10-CM | POA: Diagnosis not present

## 2018-05-21 DIAGNOSIS — I482 Chronic atrial fibrillation, unspecified: Secondary | ICD-10-CM | POA: Diagnosis present

## 2018-05-21 DIAGNOSIS — I69354 Hemiplegia and hemiparesis following cerebral infarction affecting left non-dominant side: Secondary | ICD-10-CM | POA: Diagnosis not present

## 2018-05-21 DIAGNOSIS — R0902 Hypoxemia: Secondary | ICD-10-CM | POA: Diagnosis not present

## 2018-05-21 DIAGNOSIS — I6930 Unspecified sequelae of cerebral infarction: Secondary | ICD-10-CM

## 2018-05-21 DIAGNOSIS — A4151 Sepsis due to Escherichia coli [E. coli]: Principal | ICD-10-CM | POA: Diagnosis present

## 2018-05-21 DIAGNOSIS — R509 Fever, unspecified: Secondary | ICD-10-CM

## 2018-05-21 DIAGNOSIS — R0689 Other abnormalities of breathing: Secondary | ICD-10-CM | POA: Diagnosis not present

## 2018-05-21 DIAGNOSIS — Z823 Family history of stroke: Secondary | ICD-10-CM

## 2018-05-21 DIAGNOSIS — N183 Chronic kidney disease, stage 3 unspecified: Secondary | ICD-10-CM | POA: Diagnosis present

## 2018-05-21 DIAGNOSIS — A419 Sepsis, unspecified organism: Secondary | ICD-10-CM | POA: Diagnosis not present

## 2018-05-21 DIAGNOSIS — E785 Hyperlipidemia, unspecified: Secondary | ICD-10-CM | POA: Diagnosis present

## 2018-05-21 DIAGNOSIS — Z7901 Long term (current) use of anticoagulants: Secondary | ICD-10-CM

## 2018-05-21 DIAGNOSIS — L8990 Pressure ulcer of unspecified site, unspecified stage: Secondary | ICD-10-CM

## 2018-05-21 DIAGNOSIS — R404 Transient alteration of awareness: Secondary | ICD-10-CM | POA: Diagnosis not present

## 2018-05-21 DIAGNOSIS — I5032 Chronic diastolic (congestive) heart failure: Secondary | ICD-10-CM | POA: Diagnosis present

## 2018-05-21 DIAGNOSIS — E876 Hypokalemia: Secondary | ICD-10-CM | POA: Diagnosis present

## 2018-05-21 DIAGNOSIS — Z8673 Personal history of transient ischemic attack (TIA), and cerebral infarction without residual deficits: Secondary | ICD-10-CM

## 2018-05-21 DIAGNOSIS — Z951 Presence of aortocoronary bypass graft: Secondary | ICD-10-CM | POA: Diagnosis not present

## 2018-05-21 DIAGNOSIS — N179 Acute kidney failure, unspecified: Secondary | ICD-10-CM | POA: Diagnosis present

## 2018-05-21 DIAGNOSIS — Z79899 Other long term (current) drug therapy: Secondary | ICD-10-CM

## 2018-05-21 DIAGNOSIS — I480 Paroxysmal atrial fibrillation: Secondary | ICD-10-CM | POA: Diagnosis present

## 2018-05-21 DIAGNOSIS — R05 Cough: Secondary | ICD-10-CM | POA: Diagnosis not present

## 2018-05-21 DIAGNOSIS — Z791 Long term (current) use of non-steroidal anti-inflammatories (NSAID): Secondary | ICD-10-CM

## 2018-05-21 DIAGNOSIS — I4891 Unspecified atrial fibrillation: Secondary | ICD-10-CM | POA: Diagnosis not present

## 2018-05-21 DIAGNOSIS — I10 Essential (primary) hypertension: Secondary | ICD-10-CM | POA: Diagnosis not present

## 2018-05-21 DIAGNOSIS — E860 Dehydration: Secondary | ICD-10-CM | POA: Diagnosis present

## 2018-05-21 DIAGNOSIS — R739 Hyperglycemia, unspecified: Secondary | ICD-10-CM | POA: Diagnosis present

## 2018-05-21 DIAGNOSIS — Z955 Presence of coronary angioplasty implant and graft: Secondary | ICD-10-CM | POA: Diagnosis not present

## 2018-05-21 DIAGNOSIS — I25810 Atherosclerosis of coronary artery bypass graft(s) without angina pectoris: Secondary | ICD-10-CM | POA: Diagnosis not present

## 2018-05-21 LAB — I-STAT CG4 LACTIC ACID, ED
LACTIC ACID, VENOUS: 3.14 mmol/L — AB (ref 0.5–1.9)
Lactic Acid, Venous: 1.6 mmol/L (ref 0.5–1.9)

## 2018-05-21 LAB — COMPREHENSIVE METABOLIC PANEL
ALT: 23 U/L (ref 0–44)
AST: 34 U/L (ref 15–41)
Albumin: 3.4 g/dL — ABNORMAL LOW (ref 3.5–5.0)
Alkaline Phosphatase: 51 U/L (ref 38–126)
Anion gap: 13 (ref 5–15)
BILIRUBIN TOTAL: 1.3 mg/dL — AB (ref 0.3–1.2)
BUN: 19 mg/dL (ref 8–23)
CO2: 18 mmol/L — ABNORMAL LOW (ref 22–32)
CREATININE: 1.33 mg/dL — AB (ref 0.61–1.24)
Calcium: 8.6 mg/dL — ABNORMAL LOW (ref 8.9–10.3)
Chloride: 106 mmol/L (ref 98–111)
GFR, EST AFRICAN AMERICAN: 55 mL/min — AB (ref >60)
GFR, EST NON AFRICAN AMERICAN: 48 mL/min — AB (ref >60)
Glucose, Bld: 199 mg/dL — ABNORMAL HIGH (ref 70–99)
POTASSIUM: 3.3 mmol/L — AB (ref 3.5–5.1)
Sodium: 137 mmol/L (ref 135–145)
TOTAL PROTEIN: 6.1 g/dL — AB (ref 6.5–8.1)

## 2018-05-21 LAB — I-STAT VENOUS BLOOD GAS, ED
Acid-base deficit: 3 mmol/L — ABNORMAL HIGH (ref 0.0–2.0)
Bicarbonate: 20.1 mmol/L (ref 20.0–28.0)
O2 Saturation: 90 %
PCO2 VEN: 29.4 mmHg — AB (ref 44.0–60.0)
TCO2: 21 mmol/L — ABNORMAL LOW (ref 22–32)
pH, Ven: 7.442 — ABNORMAL HIGH (ref 7.250–7.430)
pO2, Ven: 54 mmHg — ABNORMAL HIGH (ref 32.0–45.0)

## 2018-05-21 LAB — CBC WITH DIFFERENTIAL/PLATELET
ABS IMMATURE GRANULOCYTES: 0.02 10*3/uL (ref 0.00–0.07)
BASOS PCT: 0 %
Basophils Absolute: 0 10*3/uL (ref 0.0–0.1)
EOS PCT: 0 %
Eosinophils Absolute: 0 10*3/uL (ref 0.0–0.5)
HEMATOCRIT: 44.2 % (ref 39.0–52.0)
Hemoglobin: 14.2 g/dL (ref 13.0–17.0)
Immature Granulocytes: 0 %
LYMPHS PCT: 2 %
Lymphs Abs: 0.1 10*3/uL — ABNORMAL LOW (ref 0.7–4.0)
MCH: 29.1 pg (ref 26.0–34.0)
MCHC: 32.1 g/dL (ref 30.0–36.0)
MCV: 90.6 fL (ref 80.0–100.0)
Monocytes Absolute: 0 10*3/uL — ABNORMAL LOW (ref 0.1–1.0)
Monocytes Relative: 0 %
NEUTROS ABS: 6.5 10*3/uL (ref 1.7–7.7)
NRBC: 0 % (ref 0.0–0.2)
Neutrophils Relative %: 98 %
Platelets: 172 10*3/uL (ref 150–400)
RBC: 4.88 MIL/uL (ref 4.22–5.81)
RDW: 14.5 % (ref 11.5–15.5)
WBC: 6.7 10*3/uL (ref 4.0–10.5)

## 2018-05-21 LAB — BRAIN NATRIURETIC PEPTIDE: B Natriuretic Peptide: 207.5 pg/mL — ABNORMAL HIGH (ref 0.0–100.0)

## 2018-05-21 LAB — PROTIME-INR
INR: 1.29
PROTHROMBIN TIME: 15.9 s — AB (ref 11.4–15.2)

## 2018-05-21 LAB — I-STAT TROPONIN, ED: TROPONIN I, POC: 0.04 ng/mL (ref 0.00–0.08)

## 2018-05-21 MED ORDER — SODIUM CHLORIDE 0.9 % IV SOLN
2.0000 g | Freq: Once | INTRAVENOUS | Status: AC
  Administered 2018-05-21: 2 g via INTRAVENOUS
  Filled 2018-05-21: qty 2

## 2018-05-21 MED ORDER — SODIUM CHLORIDE 0.9 % IV BOLUS
1000.0000 mL | Freq: Once | INTRAVENOUS | Status: AC
  Administered 2018-05-21: 1000 mL via INTRAVENOUS

## 2018-05-21 MED ORDER — SODIUM CHLORIDE 0.9 % IV BOLUS: 500.0000 mL | Freq: Once | INTRAVENOUS | Status: DC

## 2018-05-21 MED ORDER — VANCOMYCIN HCL IN DEXTROSE 1-5 GM/200ML-% IV SOLN
1000.0000 mg | INTRAVENOUS | Status: DC
  Filled 2018-05-21: qty 200

## 2018-05-21 MED ORDER — VANCOMYCIN HCL IN DEXTROSE 1-5 GM/200ML-% IV SOLN: 1000.0000 mg | Freq: Two times a day (BID) | INTRAVENOUS | Status: DC

## 2018-05-21 MED ORDER — SODIUM CHLORIDE 0.9 % IV SOLN
1.0000 g | Freq: Three times a day (TID) | INTRAVENOUS | Status: DC
  Administered 2018-05-22: 1 g via INTRAVENOUS
  Filled 2018-05-21 (×3): qty 1

## 2018-05-21 MED ORDER — ONDANSETRON HCL 4 MG/2ML IJ SOLN
4.0000 mg | Freq: Once | INTRAMUSCULAR | Status: AC
  Administered 2018-05-21: 4 mg via INTRAVENOUS
  Filled 2018-05-21: qty 2

## 2018-05-21 MED ORDER — POTASSIUM CHLORIDE 10 MEQ/100ML IV SOLN
10.0000 meq | Freq: Once | INTRAVENOUS | Status: AC
  Administered 2018-05-21: 10 meq via INTRAVENOUS
  Filled 2018-05-21: qty 100

## 2018-05-21 MED ORDER — VANCOMYCIN HCL IN DEXTROSE 1-5 GM/200ML-% IV SOLN
1000.0000 mg | Freq: Once | INTRAVENOUS | Status: AC
  Administered 2018-05-21: 1000 mg via INTRAVENOUS
  Filled 2018-05-21: qty 200

## 2018-05-21 NOTE — ED Notes (Signed)
Pt headed to CT,  I will get 2nd set of blood cultures when pt returns.

## 2018-05-21 NOTE — Progress Notes (Signed)
Pharmacy Antibiotic Note  Arthur [name] is a 84 y.o. male admitted on 05/21/2018 with sepsis.  Pharmacy has been consulted for vancomycin and aztreonam dosing.  Allergy to PCN was immediate rash.  Plan: Start vancomycin 1g IV Q12h Give aztreonam 2g IV x 1, then start aztreonam 1g IV Q8h Monitor clinical picture, renal function, vanc levels prn F/U C&S, abx deescalation / LOT    No data recorded.  No results for input(s): WBC, CREATININE, LATICACIDVEN, VANCOTROUGH, VANCOPEAK, VANCORANDOM, GENTTROUGH, GENTPEAK, GENTRANDOM, TOBRATROUGH, TOBRAPEAK, TOBRARND, AMIKACINPEAK, AMIKACINTROU, AMIKACIN in the last 168 hours.  CrCl cannot be calculated (Patient's most recent lab result is older than the maximum 21 days allowed.).    Allergies  Allergen Reactions  . Tape Other (See Comments)    PATIENT IS TAKING COUMADIN; HIS SKIN TEARS & BRUISES EASILY; PLEASE USE COBAN WRAP OR AN ALTERNATIVE TO MEDICAL TAPE!!  . Lasix [Furosemide] Rash  . Penicillins Rash    Has patient had a PCN reaction causing immediate rash, facial/tongue/throat swelling, SOB or lightheadedness with hypotension: Yes Has patient had a PCN reaction causing severe rash involving mucus membranes or skin necrosis: Unk Has patient had a PCN reaction that required hospitalization: Unk Has patient had a PCN reaction occurring within the last 10 years:  If all of the above answers are "NO", then may proceed with Cephalosporin use.   [name] Cross Reactors Rash   Thank you for allowing pharmacy to be a part of this patient's care.  Reginia Naas 05/21/2018 9:16 PM

## 2018-05-21 NOTE — ED Notes (Signed)
Nurse will draw labs. 

## 2018-05-21 NOTE — ED Triage Notes (Signed)
Pt brought in by [facility] from home for sudden onset of lethargy and chills tonight. Pt family endorses NV as well. Pt son states pt has had cloudy and malodorous urine x2 weeks. Pt himsels had no c/o. Pt has hx of stroke with left sided deficits. Pt is A+Ox4.

## 2018-05-22 ENCOUNTER — Encounter (HOSPITAL_COMMUNITY): Payer: Self-pay | Admitting: Family Medicine

## 2018-05-22 DIAGNOSIS — I480 Paroxysmal atrial fibrillation: Secondary | ICD-10-CM

## 2018-05-22 DIAGNOSIS — R739 Hyperglycemia, unspecified: Secondary | ICD-10-CM | POA: Diagnosis present

## 2018-05-22 DIAGNOSIS — N183 Chronic kidney disease, stage 3 (moderate): Secondary | ICD-10-CM

## 2018-05-22 DIAGNOSIS — Z8673 Personal history of transient ischemic attack (TIA), and cerebral infarction without residual deficits: Secondary | ICD-10-CM

## 2018-05-22 DIAGNOSIS — A419 Sepsis, unspecified organism: Secondary | ICD-10-CM

## 2018-05-22 DIAGNOSIS — I10 Essential (primary) hypertension: Secondary | ICD-10-CM

## 2018-05-22 DIAGNOSIS — E876 Hypokalemia: Secondary | ICD-10-CM

## 2018-05-22 DIAGNOSIS — I25810 Atherosclerosis of coronary artery bypass graft(s) without angina pectoris: Secondary | ICD-10-CM

## 2018-05-22 DIAGNOSIS — E872 Acidosis: Secondary | ICD-10-CM

## 2018-05-22 LAB — CBC WITH DIFFERENTIAL/PLATELET
Abs Immature Granulocytes: 0.06 10*3/uL (ref 0.00–0.07)
Basophils Absolute: 0 10*3/uL (ref 0.0–0.1)
Basophils Relative: 0 %
Eosinophils Absolute: 0 10*3/uL (ref 0.0–0.5)
Eosinophils Relative: 0 %
HCT: 38.4 % — ABNORMAL LOW (ref 39.0–52.0)
Hemoglobin: 12.4 g/dL — ABNORMAL LOW (ref 13.0–17.0)
Immature Granulocytes: 1 %
Lymphocytes Relative: 1 %
Lymphs Abs: 0.1 10*3/uL — ABNORMAL LOW (ref 0.7–4.0)
MCH: 29.3 pg (ref 26.0–34.0)
MCHC: 32.3 g/dL (ref 30.0–36.0)
MCV: 90.8 fL (ref 80.0–100.0)
Monocytes Absolute: 0.4 10*3/uL (ref 0.1–1.0)
Monocytes Relative: 4 %
NRBC: 0 % (ref 0.0–0.2)
Neutro Abs: 11 10*3/uL — ABNORMAL HIGH (ref 1.7–7.7)
Neutrophils Relative %: 94 %
Platelets: 164 10*3/uL (ref 150–400)
RBC: 4.23 MIL/uL (ref 4.22–5.81)
RDW: 14.6 % (ref 11.5–15.5)
WBC: 11.7 10*3/uL — AB (ref 4.0–10.5)

## 2018-05-22 LAB — COMPREHENSIVE METABOLIC PANEL
ALT: 20 U/L (ref 0–44)
AST: 38 U/L (ref 15–41)
Albumin: 3 g/dL — ABNORMAL LOW (ref 3.5–5.0)
Alkaline Phosphatase: 37 U/L — ABNORMAL LOW (ref 38–126)
Anion gap: 9 (ref 5–15)
BUN: 18 mg/dL (ref 8–23)
CO2: 19 mmol/L — ABNORMAL LOW (ref 22–32)
Calcium: 8.2 mg/dL — ABNORMAL LOW (ref 8.9–10.3)
Chloride: 108 mmol/L (ref 98–111)
Creatinine, Ser: 1.49 mg/dL — ABNORMAL HIGH (ref 0.61–1.24)
GFR calc Af Amer: 48 mL/min — ABNORMAL LOW (ref >60)
GFR calc non Af Amer: 42 mL/min — ABNORMAL LOW (ref >60)
Glucose, Bld: 156 mg/dL — ABNORMAL HIGH (ref 70–99)
Potassium: 3.3 mmol/L — ABNORMAL LOW (ref 3.5–5.1)
Sodium: 136 mmol/L (ref 135–145)
Total Bilirubin: 0.9 mg/dL (ref 0.3–1.2)
Total Protein: 5.2 g/dL — ABNORMAL LOW (ref 6.5–8.1)

## 2018-05-22 LAB — MRSA PCR SCREENING: MRSA by PCR: NEGATIVE

## 2018-05-22 LAB — BLOOD CULTURE ID PANEL (REFLEXED)
Acinetobacter baumannii: NOT DETECTED
CANDIDA KRUSEI: NOT DETECTED
Candida albicans: NOT DETECTED
Candida glabrata: NOT DETECTED
Candida parapsilosis: NOT DETECTED
Candida tropicalis: NOT DETECTED
Carbapenem resistance: NOT DETECTED
Enterobacter cloacae complex: NOT DETECTED
Enterobacteriaceae species: DETECTED — AB
Enterococcus species: NOT DETECTED
Escherichia coli: DETECTED — AB
Haemophilus influenzae: NOT DETECTED
Klebsiella oxytoca: NOT DETECTED
Klebsiella pneumoniae: NOT DETECTED
Listeria monocytogenes: NOT DETECTED
Neisseria meningitidis: NOT DETECTED
PROTEUS SPECIES: NOT DETECTED
Pseudomonas aeruginosa: NOT DETECTED
SERRATIA MARCESCENS: NOT DETECTED
STAPHYLOCOCCUS SPECIES: NOT DETECTED
Staphylococcus aureus (BCID): NOT DETECTED
Streptococcus agalactiae: NOT DETECTED
Streptococcus pneumoniae: NOT DETECTED
Streptococcus pyogenes: NOT DETECTED
Streptococcus species: NOT DETECTED

## 2018-05-22 LAB — URINALYSIS, ROUTINE W REFLEX MICROSCOPIC
Bilirubin Urine: NEGATIVE
GLUCOSE, UA: NEGATIVE mg/dL
Ketones, ur: NEGATIVE mg/dL
Nitrite: NEGATIVE
PROTEIN: NEGATIVE mg/dL
SPECIFIC GRAVITY, URINE: 1.014 (ref 1.005–1.030)
pH: 5 (ref 5.0–8.0)

## 2018-05-22 LAB — CBG MONITORING, ED
Glucose-Capillary: 143 mg/dL — ABNORMAL HIGH (ref 70–99)
Glucose-Capillary: 103 mg/dL — ABNORMAL HIGH (ref 70–99)
Glucose-Capillary: 90 mg/dL (ref 70–99)

## 2018-05-22 LAB — MAGNESIUM: Magnesium: 1.5 mg/dL — ABNORMAL LOW (ref 1.7–2.4)

## 2018-05-22 LAB — LACTIC ACID, PLASMA
Lactic Acid, Venous: 2.9 mmol/L (ref 0.5–1.9)
Lactic Acid, Venous: 1.6 mmol/L (ref 0.5–1.9)

## 2018-05-22 LAB — GLUCOSE, CAPILLARY
Glucose-Capillary: 104 mg/dL — ABNORMAL HIGH (ref 70–99)
Glucose-Capillary: 122 mg/dL — ABNORMAL HIGH (ref 70–99)

## 2018-05-22 MED ORDER — ACETAMINOPHEN 650 MG RE SUPP: 650.0000 mg | Freq: Four times a day (QID) | RECTAL | Status: DC | PRN

## 2018-05-22 MED ORDER — SODIUM CHLORIDE 0.9 % IV BOLUS
500.0000 mL | Freq: Once | INTRAVENOUS | Status: AC
  Administered 2018-05-22: 500 mL via INTRAVENOUS

## 2018-05-22 MED ORDER — MAGNESIUM SULFATE 2 GM/50ML IV SOLN
2.0000 g | Freq: Once | INTRAVENOUS | Status: AC
  Administered 2018-05-22: 2 g via INTRAVENOUS
  Filled 2018-05-22: qty 50

## 2018-05-22 MED ORDER — INSULIN ASPART 100 UNIT/ML ~~LOC~~ SOLN: 0.0000 [IU] | Freq: Every day | SUBCUTANEOUS | Status: DC

## 2018-05-22 MED ORDER — ROSUVASTATIN CALCIUM 20 MG PO TABS
20.0000 mg | ORAL_TABLET | Freq: Every day | ORAL | Status: DC
  Administered 2018-05-22 – 2018-05-24 (×3): 20 mg via ORAL
  Filled 2018-05-22 (×3): qty 1

## 2018-05-22 MED ORDER — FAMOTIDINE 20 MG PO TABS: 20.0000 mg | ORAL_TABLET | Freq: Two times a day (BID) | ORAL | Status: DC

## 2018-05-22 MED ORDER — APIXABAN 5 MG PO TABS
5.0000 mg | ORAL_TABLET | Freq: Two times a day (BID) | ORAL | Status: DC
  Administered 2018-05-22 – 2018-05-25 (×8): 5 mg via ORAL
  Filled 2018-05-22 (×9): qty 1

## 2018-05-22 MED ORDER — MAGNESIUM SULFATE IN D5W 1-5 GM/100ML-% IV SOLN
1.0000 g | Freq: Once | INTRAVENOUS | Status: AC
  Administered 2018-05-22: 1 g via INTRAVENOUS
  Filled 2018-05-22: qty 100

## 2018-05-22 MED ORDER — SODIUM CHLORIDE 0.9% FLUSH
3.0000 mL | Freq: Two times a day (BID) | INTRAVENOUS | Status: DC
  Administered 2018-05-22 – 2018-05-24 (×5): 3 mL via INTRAVENOUS

## 2018-05-22 MED ORDER — ONDANSETRON HCL 4 MG/2ML IJ SOLN: 4.0000 mg | Freq: Four times a day (QID) | INTRAMUSCULAR | Status: DC | PRN

## 2018-05-22 MED ORDER — POLYVINYL ALCOHOL 1.4 % OP SOLN
Freq: Every day | OPHTHALMIC | Status: DC
  Administered 2018-05-22 – 2018-05-23 (×2): via OPHTHALMIC
  Administered 2018-05-24: 1 [drp] via OPHTHALMIC
  Filled 2018-05-22 (×2): qty 15

## 2018-05-22 MED ORDER — FAMOTIDINE 20 MG PO TABS
20.0000 mg | ORAL_TABLET | Freq: Every day | ORAL | Status: DC
  Administered 2018-05-23 – 2018-05-25 (×3): 20 mg via ORAL
  Filled 2018-05-22 (×3): qty 1

## 2018-05-22 MED ORDER — POLYVINYL ALCOHOL 1.4 % OP SOLN: 1.0000 [drp] | Freq: Four times a day (QID) | OPHTHALMIC | Status: DC | PRN

## 2018-05-22 MED ORDER — ONDANSETRON HCL 4 MG PO TABS: 4.0000 mg | ORAL_TABLET | Freq: Four times a day (QID) | ORAL | Status: DC | PRN

## 2018-05-22 MED ORDER — GUAIFENESIN ER 600 MG PO TB12
600.0000 mg | ORAL_TABLET | Freq: Two times a day (BID) | ORAL | Status: DC | PRN
  Administered 2018-05-22 – 2018-05-23 (×2): 600 mg via ORAL
  Filled 2018-05-22 (×2): qty 1

## 2018-05-22 MED ORDER — MONTELUKAST SODIUM 10 MG PO TABS
10.0000 mg | ORAL_TABLET | Freq: Every day | ORAL | Status: DC
  Administered 2018-05-22 – 2018-05-24 (×3): 10 mg via ORAL
  Filled 2018-05-22 (×5): qty 1

## 2018-05-22 MED ORDER — SODIUM CHLORIDE 0.9 % IV SOLN
2.0000 g | INTRAVENOUS | Status: DC
  Administered 2018-05-22 – 2018-05-24 (×3): 2 g via INTRAVENOUS
  Filled 2018-05-22 (×3): qty 20

## 2018-05-22 MED ORDER — HYDROCODONE-ACETAMINOPHEN 5-325 MG PO TABS: 1.0000 | ORAL_TABLET | ORAL | Status: DC | PRN

## 2018-05-22 MED ORDER — INSULIN ASPART 100 UNIT/ML ~~LOC~~ SOLN: 0.0000 [IU] | Freq: Three times a day (TID) | SUBCUTANEOUS | Status: DC

## 2018-05-22 MED ORDER — ACETAMINOPHEN 325 MG PO TABS
650.0000 mg | ORAL_TABLET | Freq: Four times a day (QID) | ORAL | Status: DC | PRN
  Administered 2018-05-22: 650 mg via ORAL
  Filled 2018-05-22: qty 2

## 2018-05-22 MED ORDER — POTASSIUM CHLORIDE 10 MEQ/100ML IV SOLN
10.0000 meq | INTRAVENOUS | Status: AC
  Administered 2018-05-22 (×3): 10 meq via INTRAVENOUS
  Filled 2018-05-22 (×3): qty 100

## 2018-05-22 MED ORDER — DICLOFENAC SODIUM 1 % TD GEL: 2.0000 g | Freq: Four times a day (QID) | TRANSDERMAL | Status: DC | PRN

## 2018-05-22 MED ORDER — METRONIDAZOLE IN NACL 5-0.79 MG/ML-% IV SOLN
500.0000 mg | Freq: Three times a day (TID) | INTRAVENOUS | Status: DC
  Administered 2018-05-22 (×2): 500 mg via INTRAVENOUS
  Filled 2018-05-22 (×2): qty 100

## 2018-05-22 MED ORDER — DILTIAZEM HCL-DEXTROSE 100-5 MG/100ML-% IV SOLN (PREMIX)
5.0000 mg/h | INTRAVENOUS | Status: DC
  Filled 2018-05-22: qty 100

## 2018-05-22 MED ORDER — POTASSIUM CHLORIDE IN NACL 20-0.9 MEQ/L-% IV SOLN
INTRAVENOUS | Status: DC
  Administered 2018-05-22 – 2018-05-24 (×4): via INTRAVENOUS
  Filled 2018-05-22 (×5): qty 1000

## 2018-05-22 MED ORDER — PANTOPRAZOLE SODIUM 40 MG PO TBEC
40.0000 mg | DELAYED_RELEASE_TABLET | Freq: Every day | ORAL | Status: DC
  Administered 2018-05-22 – 2018-05-25 (×4): 40 mg via ORAL
  Filled 2018-05-22 (×4): qty 1

## 2018-05-22 MED ORDER — ACETAMINOPHEN 325 MG PO TABS: 650.0000 mg | ORAL_TABLET | Freq: Four times a day (QID) | ORAL | Status: DC | PRN

## 2018-05-22 MED ORDER — SENNOSIDES-DOCUSATE SODIUM 8.6-50 MG PO TABS: 1.0000 | ORAL_TABLET | Freq: Every evening | ORAL | Status: DC | PRN

## 2018-05-22 NOTE — ED Notes (Signed)
Pt resting with eyes closed. Resp even and unlabored. Pt easily arousable with soft voice. Pt denies discomfort. Updated on plan of care.

## 2018-05-22 NOTE — ED Notes (Signed)
Paged opyd for rn susanna

## 2018-05-22 NOTE — ED Notes (Signed)
Dr Myna Hidalgo notified of elevated lactic acid and heart rate ranging from mid 30's to 90's prior to starting cardizem.

## 2018-05-22 NOTE — Progress Notes (Signed)
TEAM 1 - Stepdown/ICU TEAM  Arthur [name]  CNO:[id number] DOB: 06/29/1928 DOA: 05/21/2018 PCP: Martinique, Peter M, MD    Brief Narrative:  84 y.o. male w/ a hx of chronic diastolic CHF, paroxysmal atrial fibrillation on Eliquis, CVA with left hemiparesis, and CKD stage III who presented to the ED w/ the acute onset of lethargy, shaking chills, and nausea with one episode of nonbloody vomiting.    In the ED he was found to be tachycardic in the 120s, mildly tachypneic, and w/ SBP in the 90s to low 100s. EKG featured atrial fibrillation with RVR at 126. Lactic acid was elevated to 3.14.   Significant Events: 12/9 admit   Subjective: Pt is seen for a f/u visit.    Assessment & Plan:  Sepsis due to Pyelonephritis w/ E coli bacteremia    Chronic Atrial fibrillation with acute RVR  Chronically on Eliquis but not on rate-control agent - CHADS-VASc is 6   History of CVA  Stable left-sided deficits   CAD No anginal complaints - troponin normal   AKI on CKD stage III  Baseline crt 1.1-1.2 in February 2019    Hypokalemia   Hypomagnesemia   DVT prophylaxis: eliquis  Code Status: FULL CODE Family Communication:  Disposition Plan:   Consultants:  none  Antimicrobials:  Aztreonam 12/8 > Flagyl 12/8 > Vanc 12/8 >  Objective: Blood pressure 123/74, pulse (!) 109, temperature (!) 97.4 F (36.3 C), temperature source Axillary, resp. rate 17, height 5\' 7"  (1.702 m), weight 74 kg, SpO2 92 %.  Intake/Output Summary (Last 24 hours) at 05/22/2018 1652 Last data filed at 05/22/2018 1637 Gross per 24 hour  Intake 2100 ml  Output 125 ml  Net 1975 ml   Filed Weights   05/21/18 2200  Weight: 74 kg    Examination: Pt was seen for a f/u visit.    CBC: Recent Labs  Lab 05/21/18 2110 05/22/18 0202  WBC 6.7 11.7*  NEUTROABS 6.5 11.0*  HGB 14.2 12.4*  HCT 44.2 38.4*  MCV 90.6 90.8  PLT 172 294   Basic Metabolic Panel: Recent Labs  Lab 05/21/18 2110  05/22/18 0202  NA 137 136  K 3.3* 3.3*  CL 106 108  CO2 18* 19*  GLUCOSE 199* 156*  BUN 19 18  CREATININE 1.33* 1.49*  CALCIUM 8.6* 8.2*  MG  --  1.5*   GFR: Estimated Creatinine Clearance: 32.7 mL/min (A) (by C-G formula based on SCr of 1.49 mg/dL (H)).  Liver Function Tests: Recent Labs  Lab 05/21/18 2110 05/22/18 0202  AST 34 38  ALT 23 20  ALKPHOS 51 37*  BILITOT 1.3* 0.9  PROT 6.1* 5.2*  ALBUMIN 3.4* 3.0*    Coagulation Profile: Recent Labs  Lab 05/21/18 2110  INR 1.29    HbA1C: Hgb A1c MFr Bld  Date/Time Value Ref Range Status  07/13/2017 03:50 AM 5.6 4.8 - 5.6 % Final    Comment:    (NOTE) Pre diabetes:          5.7%-6.4% Diabetes:              >6.4% Glycemic control for   <7.0% adults with diabetes   07/12/2017 03:59 AM 5.6 4.8 - 5.6 % Final    Comment:    (NOTE) Pre diabetes:          5.7%-6.4% Diabetes:              >6.4% Glycemic control for   <7.0% adults with  diabetes     CBG: Recent Labs  Lab 05/22/18 0213 05/22/18 0804 05/22/18 1100  GLUCAP 143* 103* 90    Recent Results (from the past 240 hour(s))  Blood Culture (routine x 2)     Status: None (Preliminary result)   Collection Time: 05/21/18  9:20 PM  Result Value Ref Range Status   Specimen Description BLOOD RIGHT HAND  Final   Special Requests   Final    BOTTLES DRAWN AEROBIC AND ANAEROBIC Blood Culture results may not be optimal due to an inadequate volume of blood received in culture bottles   Culture   Final    NO GROWTH < 24 HOURS Performed at [facility], [place] [address] [place], [place] [address]    Report Status PENDING  Incomplete  Blood Culture (routine x 2)     Status: None (Preliminary result)   Collection Time: 05/21/18  9:40 PM  Result Value Ref Range Status   Specimen Description BLOOD RIGHT HAND  Final   Special Requests   Final    BOTTLES DRAWN AEROBIC AND ANAEROBIC Blood Culture adequate volume   Culture  Setup Time   Final    GRAM NEGATIVE  RODS AEROBIC BOTTLE ONLY Organism ID to follow CRITICAL RESULT CALLED TO, READ BACK BY AND VERIFIED WITH: Mentor [id number] MLM Performed at [facility], [place] [address], [place], [place] [address]    Culture GRAM NEGATIVE RODS  Final   Report Status PENDING  Incomplete  Blood Culture ID Panel (Reflexed)     Status: Abnormal   Collection Time: 05/21/18  9:40 PM  Result Value Ref Range Status   Enterococcus species NOT DETECTED NOT DETECTED Final   Listeria monocytogenes NOT DETECTED NOT DETECTED Final   Staphylococcus species NOT DETECTED NOT DETECTED Final   Staphylococcus aureus (BCID) NOT DETECTED NOT DETECTED Final   Streptococcus species NOT DETECTED NOT DETECTED Final   Streptococcus agalactiae NOT DETECTED NOT DETECTED Final   Streptococcus pneumoniae NOT DETECTED NOT DETECTED Final   Streptococcus pyogenes NOT DETECTED NOT DETECTED Final   Acinetobacter baumannii NOT DETECTED NOT DETECTED Final   Enterobacteriaceae species DETECTED (A) NOT DETECTED Final    Comment: Enterobacteriaceae represent a large family of gram-negative bacteria, not a single organism. CRITICAL RESULT CALLED TO, READ BACK BY AND VERIFIED WITH: PHARMD M PHAM [id number] MLM    Enterobacter cloacae complex NOT DETECTED NOT DETECTED Final   Escherichia coli DETECTED (A) NOT DETECTED Final    Comment: CRITICAL RESULT CALLED TO, READ BACK BY AND VERIFIED WITH: PHARMD M PHAM [phone] MLM    Klebsiella oxytoca NOT DETECTED NOT DETECTED Final   Klebsiella pneumoniae NOT DETECTED NOT DETECTED Final   Proteus species NOT DETECTED NOT DETECTED Final   Serratia marcescens NOT DETECTED NOT DETECTED Final   Carbapenem resistance NOT DETECTED NOT DETECTED Final   Haemophilus influenzae NOT DETECTED NOT DETECTED Final   Neisseria meningitidis NOT DETECTED NOT DETECTED Final   Pseudomonas aeruginosa NOT DETECTED NOT DETECTED Final   Candida albicans NOT DETECTED NOT DETECTED Final   Candida  glabrata NOT DETECTED NOT DETECTED Final   Candida krusei NOT DETECTED NOT DETECTED Final   Candida parapsilosis NOT DETECTED NOT DETECTED Final   Candida tropicalis NOT DETECTED NOT DETECTED Final    Comment: Performed at [facility], [place]. [address], [place], [place] [address]     Scheduled Meds: . apixaban  5 mg Oral BID  .  famotidine  20 mg Oral Daily  . insulin aspart  0-5 Units Subcutaneous QHS  . insulin aspart  0-9 Units Subcutaneous TID WC  . montelukast  10 mg Oral QHS  . pantoprazole  40 mg Oral QPC breakfast  . polyvinyl alcohol   Left Eye QHS  . rosuvastatin  20 mg Oral q1800  . sodium chloride flush  3 mL Intravenous Q12H   Continuous Infusions: . 0.9 % NaCl with KCl 20 mEq / L 75 mL/hr at 05/22/18 1629  . cefTRIAXone (ROCEPHIN)  IV       LOS: 1 day   Time spent: No Charge  Cherene Altes, MD [facility] Office  [phone] Pager - Text Page per Amion as per below:  On-Call/Text Page:      [email or web address]  If 7PM-7AM, please contact night-coverage [email or web address] 05/22/2018, 4:52 PM

## 2018-05-22 NOTE — ED Notes (Signed)
Message sent to pharmacy to send aztreonam

## 2018-05-22 NOTE — Progress Notes (Signed)
Received call from tele that patient had a 15 beat run of Vtach. MD notified. Patient resting comfortably. Will continue to monitor.

## 2018-05-22 NOTE — Progress Notes (Signed)
PHARMACY - PHYSICIAN COMMUNICATION CRITICAL VALUE ALERT - BLOOD CULTURE IDENTIFICATION (BCID)  Arthur [name] is an 84 y.o. male who presented to [facility] on 05/21/2018 with a chief complaint of chills, lethargy.   Assessment:  Blood cultures turned positive for e.coli 1/4 bottles today. Currently he is on vanc/aztreonam empirically. PCN allergy = Rash. Will d/w with Dr. Thereasa Solo to change to ceftriaxone.   Name of physician (or Provider) Contacted: Dr. Thereasa Solo  Current antibiotics: Vanc/aztreonam/flagyl  Changes to prescribed antibiotics recommended:  Ceftriaxone 2g IV q24  Onnie Boer, PharmD, [place], AAHIVP, CPP Infectious Disease Pharmacist 05/22/2018 3:41 PM

## 2018-05-22 NOTE — H&P (Signed)
History and Physical    Arthur [name]:[id number] DOB: March 05, 1929 DOA: 05/21/2018  PCP: Martinique, Peter M, MD   Patient coming from: Home   Chief Complaint: Acute-onset lethargy, shaking chills, nausea with vomiting x1   HPI: Arthur [name] is a 84 y.o. male with medical history significant for coronary artery disease chronic diastolic CHF,, paroxysmal atrial fibrillation on Eliquis, history of CVA with left hemiparesis, and chronic kidney disease stage III, now presenting to the emergency department for evaluation of acute onset lethargy, shaking chills, and nausea with one episode of nonbloody vomiting.  Patient is accompanied by a family member who assist with the history.  He had reportedly been in his usual state until last night when he developed acute onset of lethargy, complained of chills and was noted to be shaking, and also complained of nausea with one episode of nonbloody vomiting.  Per report of family, he has been expressing a need to urinate frequently for the past couple days, often very shortly after he had just voided.  He denies any abdominal pain, had some fleeting lower back pain last night, but also denies chest pain or shortness of breath.  He has had a mild nonproductive cough, but no rhinorrhea or sore throat.  Denies lower extremity swelling or tenderness.  Has a small skin tear at his left elbow, but no other rash or wounds.  No headache or neck stiffness.  No diarrhea. EMS was called and the patient was found to be febrile and transported to the hospital.  ED Course: Upon arrival to the ED, patient is found to be tachycardic in the 120s, mildly tachypneic, and with systolic blood pressure in the 90s to low 100s.  EKG features atrial fibrillation with RVR, rate 126.  Chest x-ray is negative for acute cardiopulmonary disease.  Chemistry panel is notable for potassium 3.3, bicarbonate 18, and creatinine of 1.33, slightly higher than priors.  CBC is unremarkable.  Lactic acid  is elevated to 3.14, troponin is normal, and BNP is slightly elevated.  Blood cultures were collected in the ED, 1 L of normal saline was given, patient was treated with aztreonam and vancomycin.  Heart rate improved slightly, blood pressure remained stable, and the patient will be admitted to the stepdown unit for ongoing evaluation and management of sepsis, likely urinary, complicated by atrial fibrillation with RVR.  Review of Systems:  All other systems reviewed and apart from HPI, are negative.  Past Medical History:  Diagnosis Date  . Arthritis    "some in my joints" (01/24/2015)  . CKD (chronic kidney disease), stage II   . Coronary artery disease    a. s/p CABG in 2005 with MV repair and MAZE procedure. b. s/p DES to prox RCA 01/2015.   Marland Kitchen CVA (cerebrovascular accident) [facility]) ~ 2014   RIGHT BRAIN; denies residual on 01/24/2015  . Esophagitis    Distal esophagitis  . GERD (gastroesophageal reflux disease)   . Heart murmur   . History of hiatal hernia   . History of recurrent TIAs   . Hyperlipidemia   . Hypertension   . Hypertensive vascular disease   . MVP (mitral valve prolapse)   . Nephrolithiasis   . Odynophagia   . PAF (paroxysmal atrial fibrillation) ([place])   . Transudative pleural effusion     Past Surgical History:  Procedure Laterality Date  . APPENDECTOMY  1960's  . CARDIAC CATHETERIZATION  03/19/2004  . CARDIAC CATHETERIZATION N/A 01/24/2015   Procedure: Left Heart Cath  and Coronary Angiography;  Surgeon: Peter M Martinique, MD;  Location: [place] [facility];  Service: Cardiovascular;  Laterality: N/A;  . CARDIAC CATHETERIZATION N/A 01/24/2015   Procedure: Coronary Stent Intervention;  Surgeon: Peter M Martinique, MD;  Location: [place] [facility];  Service: Cardiovascular;  Laterality: N/A;  . CORONARY ANGIOPLASTY WITH STENT PLACEMENT  01/24/2015   "1 stent"  . CORONARY ARTERY BYPASS GRAFT  04/2004   LIMA GRAFT TO THE DISTAL LAD, SAPHENOUS VEIN GRAFT TO THE FIRST DIADGONAL  BRANCH, SAPHENOUS VEIN GRAFT TO THE THIRD MARIGINAL BRANCH, AND SAPHENOUS VEIN GRAFT TO THE PDA  . IR ANGIO VERTEBRAL SEL SUBCLAVIAN INNOMINATE UNI R MOD SED  07/11/2017  . IR PERCUTANEOUS ART THROMBECTOMY/INFUSION INTRACRANIAL INC DIAG ANGIO  07/11/2017  . MAZE  04/2004  . MITRAL VALVE REPAIR  04/2004  . RADIOLOGY WITH ANESTHESIA N/A 07/11/2017   Procedure: RADIOLOGY WITH ANESTHESIA;  Surgeon: Luanne Bras, MD;  Location: [place];  Service: Radiology;  Laterality: N/A;  . TEE WITHOUT CARDIOVERSION  05/19/2011   Procedure: TRANSESOPHAGEAL ECHOCARDIOGRAM (TEE);  Surgeon: Peter Martinique, MD;  Location: [facility];  Service: Cardiovascular;  Laterality: N/A;  . [place]     reports that he quit smoking about 62 years ago. His smoking use included cigarettes. He quit after 3.00 years of use. He has never used smokeless tobacco. He reports that he does not drink alcohol or use drugs.  Allergies  Allergen Reactions  . Tape Other (See Comments)    PATIENT IS TAKING COUMADIN; HIS SKIN TEARS & BRUISES EASILY; PLEASE USE COBAN WRAP OR AN ALTERNATIVE TO MEDICAL TAPE!!  . Lasix [Furosemide] Rash  . Penicillins Rash    Has patient had a PCN reaction causing immediate rash, facial/tongue/throat swelling, SOB or lightheadedness with hypotension: Yes Has patient had a PCN reaction causing severe rash involving mucus membranes or skin necrosis: Unknown Has patient had a PCN reaction that required hospitalization: Unknown Has patient had a PCN reaction occurring within the last 10 years: unknown If all of the above answers are "NO", then may proceed with Cephalosporin use.   [name] Cross Reactors Rash    Family History  Problem Relation Age of Onset  . Heart attack Mother   . Stroke Father   . Hypertension Father   . Kidney failure Father      Prior to Admission medications   Medication Sig Start Date End Date Taking? Authorizing Provider  acetaminophen  (TYLENOL) 650 MG CR tablet Take 650 mg by mouth daily as needed (knee pain).    Yes [provider]  apixaban (ELIQUIS) 5 MG TABS tablet Take 1 tablet (5 mg total) by mouth 2 (two) times daily. Patient taking differently: Take 5 mg by mouth See admin instructions. Take one tablet (5 mg) by mouth twice daily - before breakfast and after supper 05/03/18  Yes Martinique, Peter M, MD  benazepril (LOTENSIN) 5 MG tablet Take 1 tablet (5 mg total) by mouth daily. Patient taking differently: Take 5 mg by mouth daily after breakfast.  05/03/18  Yes Martinique, Peter M, MD  Carboxymethylcellulose Sodium (REFRESH LIQUIGEL OP) Place 1 drop into the left eye at bedtime.   Yes [provider]  diclofenac sodium (VOLTAREN) 1 % GEL Apply 2 g topically 4 (four) times daily. Patient taking differently: Apply 2 g topically 4 (four) times daily as needed (joint pain).  08/03/17  Yes Love, Ivan Anchors, PA-C  guaiFENesin (MUCINEX) 600 MG 12 hr  tablet Take 600 mg by mouth daily.   Yes [provider]  montelukast (SINGULAIR) 10 MG tablet Take 1 tablet (10 mg total) by mouth at bedtime. Patient taking differently: Take 10 mg by mouth daily after breakfast.  05/03/18  Yes Martinique, Peter M, MD  NITROSTAT 0.4 MG SL tablet PLACE 1 TABLET (0.4 MG TOTAL) UNDER THE TONGUE EVERY 5 (FIVE) MINUTESAS NEEDED FOR CHEST PAIN (UP TO 3 DOSES). Patient taking differently: Place 0.4 mg under the tongue every 5 (five) minutes as needed for chest pain.  12/13/16  Yes Martinique, Peter M, MD  pantoprazole (PROTONIX) 40 MG tablet Take 1 tablet (40 mg total) by mouth daily. Patient taking differently: Take 40 mg by mouth daily after breakfast.  05/03/18  Yes Martinique, Peter M, MD  phosphorus (K PHOS NEUTRAL) [phone] MG tablet Take 1 tablet (250 mg total) by mouth daily. Patient taking differently: Take 250 mg by mouth daily after breakfast.  05/03/18  Yes Martinique, Peter M, MD  Polyvinyl Alcohol-Povidone (REFRESH OP) Place 1 drop into  the left eye 4 (four) times daily as needed (dry eyes).   Yes [provider]  ranitidine (ZANTAC) 150 MG tablet Take 1 tablet (150 mg total) by mouth 2 (two) times daily. Patient taking differently: Take 150 mg by mouth See admin instructions. Take one tablet (150 mg) by mouth twice daily - before breakfast and after supper 05/03/18  Yes Martinique, Peter M, MD  rosuvastatin (CRESTOR) 20 MG tablet Take 1 tablet (20 mg total) by mouth daily. Patient taking differently: Take 20 mg by mouth daily after breakfast.  05/03/18  Yes Martinique, Peter M, MD  White Petrolatum-Mineral Oil (REFRESH P.M. OP) Place 1 application into the left eye at bedtime.   Yes [provider]  artificial tears (LACRILUBE) OINT ophthalmic ointment Place into the left eye at bedtime. Patient not taking: Reported on 05/21/2018 08/03/17   Love, Ivan Anchors, PA-C  polyvinyl alcohol (LIQUIFILM TEARS) 1.4 % ophthalmic solution Place 1-2 drops into the left eye 4 (four) times daily -  with meals and at bedtime. Patient not taking: Reported on 05/21/2018 08/03/17   Flora Lipps    Physical Exam: Vitals:   05/21/18 2315 05/21/18 2330 05/21/18 2345 05/22/18 0029  BP: 114/69 106/66 107/73 98/67  Pulse: (!) 128 (!) 127 (!) 128 (!) 123  Resp: (!) 27 (!) 21 (!) 25 (!) 22  Temp:    98.4 F (36.9 C)  TempSrc:    Oral  SpO2: 96% 96% 95% 98%  Weight:        Constitutional: NAD, calm, comfortable Eyes: PERTLA, lids and conjunctivae normal ENMT: Mucous membranes are moist. Posterior pharynx clear of any exudate or lesions.   Neck: normal, supple, no masses, no thyromegaly Respiratory: clear to auscultation bilaterally, no wheezing, no crackles. Normal respiratory effort.    Cardiovascular: Rate ~120 and regular. No extremity edema.  Abdomen: No distension, no tenderness, soft. Bowel sounds normal.  Musculoskeletal: no clubbing / cyanosis. No joint deformity upper and lower extremities.    Skin: no significant rashes,  lesions, ulcers. Warm, dry, well-perfused. Neurologic: No gross facial asymmetry. Mild dysarthria. Sensation intact. Left-sided hemiplegia.  Psychiatric: Alert and oriented to person, place, and situation. Pleasant and cooperative.    Labs on Admission: I have personally reviewed following labs and imaging studies  CBC: Recent Labs  Lab 05/21/18 2110  WBC 6.7  NEUTROABS 6.5  HGB 14.2  HCT 44.2  MCV 90.6  PLT 172  Basic Metabolic Panel: Recent Labs  Lab 05/21/18 2110  NA 137  K 3.3*  CL 106  CO2 18*  GLUCOSE 199*  BUN 19  CREATININE 1.33*  CALCIUM 8.6*   GFR: Estimated Creatinine Clearance: 36.6 mL/min (A) (by C-G formula based on SCr of 1.33 mg/dL (H)). Liver Function Tests: Recent Labs  Lab 05/21/18 2110  AST 34  ALT 23  ALKPHOS 51  BILITOT 1.3*  PROT 6.1*  ALBUMIN 3.4*   No results for input(s): LIPASE, AMYLASE in the last 168 hours. No results for input(s): AMMONIA in the last 168 hours. Coagulation Profile: Recent Labs  Lab 05/21/18 2110  INR 1.29   Cardiac Enzymes: No results for input(s): CKTOTAL, CKMB, CKMBINDEX, TROPONINI in the last 168 hours. BNP (last 3 results) No results for input(s): PROBNP in the last 8760 hours. HbA1C: No results for input(s): HGBA1C in the last 72 hours. CBG: No results for input(s): GLUCAP in the last 168 hours. Lipid Profile: No results for input(s): CHOL, HDL, LDLCALC, TRIG, CHOLHDL, LDLDIRECT in the last 72 hours. Thyroid Function Tests: No results for input(s): TSH, T4TOTAL, FREET4, T3FREE, THYROIDAB in the last 72 hours. Anemia Panel: No results for input(s): VITAMINB12, FOLATE, FERRITIN, TIBC, IRON, RETICCTPCT in the last 72 hours. Urine analysis:    Component Value Date/Time   COLORURINE YELLOW 07/14/2017 1618   APPEARANCEUR CLEAR 07/14/2017 1618   LABSPEC 1.013 07/14/2017 1618   PHURINE 6.0 07/14/2017 1618   GLUCOSEU 50 (A) 07/14/2017 1618   HGBUR MODERATE (A) 07/14/2017 1618   BILIRUBINUR NEGATIVE  07/14/2017 1618   KETONESUR 20 (A) 07/14/2017 1618   PROTEINUR 30 (A) 07/14/2017 1618   UROBILINOGEN 1.0 05/13/2011 2200   NITRITE NEGATIVE 07/14/2017 1618   LEUKOCYTESUR NEGATIVE 07/14/2017 1618   Sepsis Labs: @LABRCNTIP (procalcitonin:4,lacticidven:4) )No results found for this or any previous visit (from the past 240 hour(s)).   Radiological Exams on Admission: Dg Chest Port 1 View  Result Date: 05/21/2018 CLINICAL DATA:  Lethargy, chills EXAM: PORTABLE CHEST 1 VIEW COMPARISON:  07/14/2017 FINDINGS: Chronic pleural-parenchymal scarring along the lateral right lower hemithorax. Left lung is clear. No pleural effusion or pneumothorax. Mild cardiomegaly.  Insert cabbage Median sternotomy. IMPRESSION: No evidence of acute cardiopulmonary disease. Chronic pleural-parenchymal scarring along the lateral right lower hemithorax. Electronically Signed   By: Julian Hy M.D.   On: 05/21/2018 21:48    EKG: Independently reviewed. Atrial fibrillation with RVR (rate 126).   Assessment/Plan   1. Sepsis - Presents with acute-onset of lethargy, rigors, and N/V without abdominal pain  - He was complaining of urinary urgency and frequency last couple days, denies abdominal or flank pain, and denies SOB  - Found to be febrile, tachycardic, tachypneic, with normal WBC but lactate 3.14, clear CXR, UA still not collected  - Blood cultures were collected, 1 liter NS given, and he was treated with vancomycin and aztreonam in ED  - Source is most likely urinary, and given his rigors, likely bacteremic   - Check UA and send for culture, given additional fluid bolus, continue empiric broad-spectrum antibiotics and narrow to urinary coverage if indicated when UA available    2. Atrial fibrillation with RVR  - Patient has PAF on Eliquis, not on rate-control agent, presents with HR in 130 range, improved slightly with IVF  - CHADS-VASc is 49 (age x2, CVA x2, CHF, CAD)  - Continue Eliquis, start diltiazem  infusion   3. History of CVA  - Stable left-sided deficits  - Continue statin  and Eliquis    4. CAD - No anginal complaints, troponin normal  - Continue statin, hold ACE initially given sepsis with low-normal BP in ED    5. CKD stage III  - SCr is 1.33 in ED, up from 1.1-1.2 in February  - Renally-dose medications, monitor    6. Hypokalemia  - Serum potassium is 3.3 on admission, likely from vomiting  - Replaced IV, repeat chem panel in am     DVT prophylaxis: Eliquis  Code Status: Full Family Communication: Grandson-in-law updated at bedside Consults called: None Admission status: Inpatient     Vianne Bulls, MD [facility] Pager [phone]  If 7PM-7AM, please contact night-coverage [email or web address] Password [email or web address]  05/22/2018, 12:58 AM

## 2018-05-22 NOTE — ED Provider Notes (Addendum)
[facility] EMERGENCY DEPARTMENT Provider Note   CSN: [id number] Arrival date & time: 05/21/18  2048     History   Chief Complaint Chief Complaint  Patient presents with  . Weakness  . Emesis    HPI Arthur [name] is a 84 y.o. male.  84 year old male with prior medical history as detailed below presents for evaluation of fever with associated lethargy and weakness.  Symptoms began today.  EMS reports a temperature of 102 and mild hypoxia into the upper 80's on room air.  Patient with complaint of generalized weakness with associated nausea and vomiting.  History is provided by family member at bedside.  Patient without reported chest pain.  The history is provided by the patient and medical records.  Fever   This is a new problem. The current episode started 3 to 5 hours ago. The problem occurs constantly. The problem has not changed since onset.His temperature was unmeasured prior to arrival. Associated symptoms include vomiting and cough. Pertinent negatives include no chest pain. He has tried nothing for the symptoms.    Past Medical History:  Diagnosis Date  . Arthritis    "some in my joints" (01/24/2015)  . CKD (chronic kidney disease), stage II   . Coronary artery disease    a. s/p CABG in 2005 with MV repair and MAZE procedure. b. s/p DES to prox RCA 01/2015.   Marland Kitchen CVA (cerebrovascular accident) [facility]) ~ 2014   RIGHT BRAIN; denies residual on 01/24/2015  . Esophagitis    Distal esophagitis  . GERD (gastroesophageal reflux disease)   . Heart murmur   . History of hiatal hernia   . History of recurrent TIAs   . Hyperlipidemia   . Hypertension   . Hypertensive vascular disease   . MVP (mitral valve prolapse)   . Nephrolithiasis   . Odynophagia   . PAF (paroxysmal atrial fibrillation) ([place])   . Transudative pleural effusion     Patient Active Problem List   Diagnosis Date Noted  . Sepsis ([place]) 05/21/2018  . Leukocytosis   . Acute blood loss  anemia   . Hypernatremia   . Hypophosphatemia   . Irritation of left eye   . Stage 3 chronic kidney disease ([place])   . Acute ischemic right ACA stroke ([place]) 07/15/2017  . Left spastic hemiparesis ([place]) 07/13/2017  . Ischemic stroke ([place]) 07/11/2017  . Acute arterial ischemic stroke, multifocal, anterior circulation ([place]) 07/11/2017  . Acute respiratory failure ([place])   . Abnormal nuclear stress test 01/24/2015  . Long term current use of anticoagulant 05/28/2011  . Dizziness 05/14/2011  . Hyperlipidemia   . Hypertension   . Coronary artery disease   . Paroxysmal atrial fibrillation (H[facility])   . Chronic renal insufficiency   . 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 [facility])     Past Surgical History:  Procedure Laterality Date  . APPENDECTOMY  1960's  . CARDIAC CATHETERIZATION  03/19/2004  . CARDIAC CATHETERIZATION N/A 01/24/2015   Procedure: Left Heart Cath and Coronary Angiography;  Surgeon: Hutch Rhett M Martinique, MD;  Location: [facility] CV LAB;  Service: Cardiovascular;  Laterality: N/A;  . CARDIAC CATHETERIZATION N/A 01/24/2015   Procedure: Coronary Stent Intervention;  Surgeon: Tyren Dugar M Martinique, MD;  Location: [facility] LAB;  Service: Cardiovascular;  Laterality: N/A;  . CORONARY ANGIOPLASTY WITH STENT PLACEMENT  01/24/2015   "1 stent"  . CORONARY ARTERY BYPASS GRAFT  04/2004   LIMA GRAFT TO THE DISTAL LAD, SAPHENOUS VEIN  GRAFT TO THE FIRST DIADGONAL BRANCH, SAPHENOUS VEIN GRAFT TO THE THIRD MARIGINAL BRANCH, AND SAPHENOUS VEIN GRAFT TO THE PDA  . IR ANGIO VERTEBRAL SEL SUBCLAVIAN INNOMINATE UNI R MOD SED  07/11/2017  . IR PERCUTANEOUS ART THROMBECTOMY/INFUSION INTRACRANIAL INC DIAG ANGIO  07/11/2017  . MAZE  04/2004  . MITRAL VALVE REPAIR  04/2004  . RADIOLOGY WITH ANESTHESIA N/A 07/11/2017   Procedure: RADIOLOGY WITH ANESTHESIA;  Surgeon: Luanne Bras, MD;  Location: [facility];  Service: Radiology;  Laterality: N/A;  . TEE WITHOUT CARDIOVERSION  05/19/2011   Procedure:  TRANSESOPHAGEAL ECHOCARDIOGRAM (TEE);  Surgeon: Bridgitte Felicetti Martinique, MD;  Location: [facility] ENDOSCOPY;  Service: Cardiovascular;  Laterality: N/A;  . TONSILLECTOMY AND ADENOIDECTOMY  1944        Home Medications    Prior to Admission medications   Medication Sig Start Date End Date Taking? Authorizing Provider  acetaminophen (TYLENOL) 650 MG CR tablet Take 650 mg by mouth daily as needed (knee pain).    Yes [provider]  apixaban (ELIQUIS) 5 MG TABS tablet Take 1 tablet (5 mg total) by mouth 2 (two) times daily. Patient taking differently: Take 5 mg by mouth See admin instructions. Take one tablet (5 mg) by mouth twice daily - before breakfast and after supper 05/03/18  Yes Martinique, Hye Trawick M, MD  benazepril (LOTENSIN) 5 MG tablet Take 1 tablet (5 mg total) by mouth daily. Patient taking differently: Take 5 mg by mouth daily after breakfast.  05/03/18  Yes Martinique, Derreon Consalvo M, MD  Carboxymethylcellulose Sodium (REFRESH LIQUIGEL OP) Place 1 drop into the left eye at bedtime.   Yes [provider]  diclofenac sodium (VOLTAREN) 1 % GEL Apply 2 g topically 4 (four) times daily. Patient taking differently: Apply 2 g topically 4 (four) times daily as needed (joint pain).  08/03/17  Yes Love, Ivan Anchors, PA-C  guaiFENesin (MUCINEX) 600 MG 12 hr tablet Take 600 mg by mouth daily.   Yes [provider]  montelukast (SINGULAIR) 10 MG tablet Take 1 tablet (10 mg total) by mouth at bedtime. Patient taking differently: Take 10 mg by mouth daily after breakfast.  05/03/18  Yes Martinique, Pharrah Rottman M, MD  NITROSTAT 0.4 MG SL tablet PLACE 1 TABLET (0.4 MG TOTAL) UNDER THE TONGUE EVERY 5 (FIVE) MINUTESAS NEEDED FOR CHEST PAIN (UP TO 3 DOSES). Patient taking differently: Place 0.4 mg under the tongue every 5 (five) minutes as needed for chest pain.  12/13/16  Yes Martinique, London Tarnowski M, MD  pantoprazole (PROTONIX) 40 MG tablet Take 1 tablet (40 mg total) by mouth daily. Patient taking differently: Take 40 mg by mouth  daily after breakfast.  05/03/18  Yes Martinique, Klaire Court M, MD  phosphorus (K PHOS NEUTRAL) [phone] MG tablet Take 1 tablet (250 mg total) by mouth daily. Patient taking differently: Take 250 mg by mouth daily after breakfast.  05/03/18  Yes Martinique, Shirell Struthers M, MD  Polyvinyl Alcohol-Povidone (REFRESH OP) Place 1 drop into the left eye 4 (four) times daily as needed (dry eyes).   Yes [provider]  ranitidine (ZANTAC) 150 MG tablet Take 1 tablet (150 mg total) by mouth 2 (two) times daily. Patient taking differently: Take 150 mg by mouth See admin instructions. Take one tablet (150 mg) by mouth twice daily - before breakfast and after supper 05/03/18  Yes Martinique, Nestor Wieneke M, MD  rosuvastatin (CRESTOR) 20 MG tablet Take 1 tablet (20 mg total) by mouth daily. Patient taking differently: Take 20 mg by mouth daily after  breakfast.  05/03/18  Yes Martinique, Keaten Mashek M, MD  White Petrolatum-Mineral Oil (REFRESH P.M. OP) Place 1 application into the left eye at bedtime.   Yes [provider]  artificial tears (LACRILUBE) OINT ophthalmic ointment Place into the left eye at bedtime. Patient not taking: Reported on 05/21/2018 08/03/17   Love, Ivan Anchors, PA-C  polyvinyl alcohol (LIQUIFILM TEARS) 1.4 % ophthalmic solution Place 1-2 drops into the left eye 4 (four) times daily -  with meals and at bedtime. Patient not taking: Reported on 05/21/2018 08/03/17   Bary Leriche, PA-C    Family History Family History  Problem Relation Age of Onset  . Heart attack Mother   . Stroke Father   . Hypertension Father   . Kidney failure Father     Social History Social History   Tobacco Use  . Smoking status: Former Smoker    Years: 3.00    Types: Cigarettes    Last attempt to quit: 06/15/1955    Years since quitting: 62.9  . Smokeless tobacco: Never Used  Substance Use Topics  . Alcohol use: No  . Drug use: No     Allergies   Tape; Lasix [furosemide]; Penicillins; and Sulfa drugs cross  reactors   Review of Systems Review of Systems  Constitutional: Positive for fever.  Respiratory: Positive for cough.   Cardiovascular: Negative for chest pain.  Gastrointestinal: Positive for vomiting.  All other systems reviewed and are negative.    Physical Exam Updated Vital Signs BP 107/73   Pulse (!) 128   Resp (!) 25   Wt 74 kg   SpO2 95%   BMI 25.55 kg/m   Physical Exam   ED Treatments / Results  Labs (all labs ordered are listed, but only abnormal results are displayed) Labs Reviewed  COMPREHENSIVE METABOLIC PANEL - Abnormal; Notable for the following components:      Result Value   Potassium 3.3 (*)    CO2 18 (*)    Glucose, Bld 199 (*)    Creatinine, Ser 1.33 (*)    Calcium 8.6 (*)    Total Protein 6.1 (*)    Albumin 3.4 (*)    Total Bilirubin 1.3 (*)    GFR calc non Af Amer 48 (*)    GFR calc Af Amer 55 (*)    All other components within normal limits  CBC WITH DIFFERENTIAL/PLATELET - Abnormal; Notable for the following components:   Lymphs Abs 0.1 (*)    Monocytes Absolute 0.0 (*)    All other components within normal limits  PROTIME-INR - Abnormal; Notable for the following components:   Prothrombin Time 15.9 (*)    All other components within normal limits  BRAIN NATRIURETIC PEPTIDE - Abnormal; Notable for the following components:   B Natriuretic Peptide 207.5 (*)    All other components within normal limits  I-STAT CG4 LACTIC ACID, ED - Abnormal; Notable for the following components:   Lactic Acid, Venous 3.14 (*)    All other components within normal limits  I-STAT VENOUS BLOOD GAS, ED - Abnormal; Notable for the following components:   pH, Ven 7.442 (*)    pCO2, Ven 29.4 (*)    pO2, Ven 54.0 (*)    TCO2 21 (*)    Acid-base deficit 3.0 (*)    All other components within normal limits  CULTURE, BLOOD (ROUTINE X 2)  CULTURE, BLOOD (ROUTINE X 2)  URINE CULTURE  URINALYSIS, ROUTINE W REFLEX MICROSCOPIC  BLOOD GAS, VENOUS  I-STAT  TROPONIN, ED  I-STAT CG4 LACTIC ACID, ED    EKG EKG Interpretation  Date/Time:  Sunday May 21 2018 22:00:45 EST Ventricular Rate:  126 PR Interval:    QRS Duration: 92 QT Interval:  301 QTC Calculation: 436 R Axis:   -93 Text Interpretation:  Atrial fibrillation Consider inferior infarct Anterior infarct, old Confirmed by Dene Gentry [phone]) on 05/21/2018 11:52:49 PM   Radiology Dg Chest Port 1 View  Result Date: 05/21/2018 CLINICAL DATA:  Lethargy, chills EXAM: PORTABLE CHEST 1 VIEW COMPARISON:  07/14/2017 FINDINGS: Chronic pleural-parenchymal scarring along the lateral right lower hemithorax. Left lung is clear. No pleural effusion or pneumothorax. Mild cardiomegaly.  Insert cabbage Median sternotomy. IMPRESSION: No evidence of acute cardiopulmonary disease. Chronic pleural-parenchymal scarring along the lateral right lower hemithorax. Electronically Signed   By: Julian Hy M.D.   On: 05/21/2018 21:48    Procedures Procedures (including critical care time) CRITICAL CARE Performed by: Valarie Merino   Total critical care time: 30  minutes  Critical care time was exclusive of separately billable procedures and treating other patients.  Critical care was necessary to treat or prevent imminent or life-threatening deterioration.  Critical care was time spent personally by me on the following activities: development of treatment plan with patient and/or surrogate as well as nursing, discussions with consultants, evaluation of patient's response to treatment, examination of patient, obtaining history from patient or surrogate, ordering and performing treatments and interventions, ordering and review of laboratory studies, ordering and review of radiographic studies, pulse oximetry and re-evaluation of patient's condition.   Medications Ordered in ED Medications  sodium chloride 0.9 % bolus 1,000 mL (1,000 mLs Intravenous New Bag/Given 05/21/18 2304)  aztreonam  (AZACTAM) 1 g in sodium chloride 0.9 % 100 mL IVPB (has no administration in time range)  vancomycin (VANCOCIN) IVPB 1000 mg/200 mL premix (has no administration in time range)  potassium chloride 10 mEq in 100 mL IVPB (10 mEq Intravenous New Bag/Given 05/21/18 2310)  ondansetron (ZOFRAN) injection 4 mg (4 mg Intravenous Given 05/21/18 2207)  aztreonam (AZACTAM) 2 g in sodium chloride 0.9 % 100 mL IVPB (2 g Intravenous New Bag/Given 05/21/18 2305)  vancomycin (VANCOCIN) IVPB 1000 mg/200 mL premix (0 mg Intravenous Stopped 05/21/18 2306)     Initial Impression / Assessment and Plan / ED Course  I have reviewed the triage vital signs and the nursing notes.  Pertinent labs & imaging results that were available during my care of the patient were reviewed by me and considered in my medical decision making (see chart for details).     MDM  Screen complete  Patient is presenting for evaluation of fever with cough and hypoxia.  Patient clinically is likely to have pneumonia although his chest x-ray does not suggest the same.  Patient with lactic acidosis and evidence of dehydration on initial exam.  Patient given broad spectrum antibiotics based on presentation and possible sepsis.  Initial screening labs are remarkable for elevated lactic acid and mildly increased creatinine from his baseline.  Patient will require admission for further inpatient work-up and treatment.  Hospitalist service is aware of case and will evaluate the patient for admission. Final Clinical Impressions(s) / ED Diagnoses   Final diagnoses:  Fever, unspecified fever cause  Cough  Lactic acidosis    ED Discharge Orders    None       Valarie Merino, MD 05/22/18 0006    Valarie Merino, MD 05/22/18 [phone]

## 2018-05-23 ENCOUNTER — Other Ambulatory Visit: Payer: Self-pay

## 2018-05-23 ENCOUNTER — Encounter (HOSPITAL_COMMUNITY): Payer: Self-pay | Admitting: General Practice

## 2018-05-23 DIAGNOSIS — R7881 Bacteremia: Secondary | ICD-10-CM

## 2018-05-23 DIAGNOSIS — L8990 Pressure ulcer of unspecified site, unspecified stage: Secondary | ICD-10-CM

## 2018-05-23 DIAGNOSIS — A4151 Sepsis due to Escherichia coli [E. coli]: Principal | ICD-10-CM

## 2018-05-23 LAB — COMPREHENSIVE METABOLIC PANEL
ALT: 27 U/L (ref 0–44)
AST: 44 U/L — ABNORMAL HIGH (ref 15–41)
Albumin: 2.9 g/dL — ABNORMAL LOW (ref 3.5–5.0)
Alkaline Phosphatase: 46 U/L (ref 38–126)
Anion gap: 9 (ref 5–15)
BUN: 17 mg/dL (ref 8–23)
CO2: 19 mmol/L — ABNORMAL LOW (ref 22–32)
Calcium: 8.5 mg/dL — ABNORMAL LOW (ref 8.9–10.3)
Chloride: 109 mmol/L (ref 98–111)
Creatinine, Ser: 1.38 mg/dL — ABNORMAL HIGH (ref 0.61–1.24)
GFR calc Af Amer: 53 mL/min — ABNORMAL LOW (ref >60)
GFR calc non Af Amer: 46 mL/min — ABNORMAL LOW (ref >60)
Glucose, Bld: 97 mg/dL (ref 70–99)
Potassium: 4.3 mmol/L (ref 3.5–5.1)
SODIUM: 137 mmol/L (ref 135–145)
Total Bilirubin: 1.2 mg/dL (ref 0.3–1.2)
Total Protein: 5.4 g/dL — ABNORMAL LOW (ref 6.5–8.1)

## 2018-05-23 LAB — CBC
HCT: 41.7 % (ref 39.0–52.0)
Hemoglobin: 13.2 g/dL (ref 13.0–17.0)
MCH: 28.9 pg (ref 26.0–34.0)
MCHC: 31.7 g/dL (ref 30.0–36.0)
MCV: 91.2 fL (ref 80.0–100.0)
Platelets: 137 10*3/uL — ABNORMAL LOW (ref 150–400)
RBC: 4.57 MIL/uL (ref 4.22–5.81)
RDW: 15.1 % (ref 11.5–15.5)
WBC: 9.8 10*3/uL (ref 4.0–10.5)
nRBC: 0 % (ref 0.0–0.2)

## 2018-05-23 LAB — GLUCOSE, CAPILLARY
Glucose-Capillary: 92 mg/dL (ref 70–99)
Glucose-Capillary: 108 mg/dL — ABNORMAL HIGH (ref 70–99)

## 2018-05-23 LAB — URINE CULTURE

## 2018-05-23 LAB — MAGNESIUM: Magnesium: 2 mg/dL (ref 1.7–2.4)

## 2018-05-23 MED ORDER — LEVALBUTEROL HCL 1.25 MG/0.5ML IN NEBU
1.2500 mg | INHALATION_SOLUTION | Freq: Once | RESPIRATORY_TRACT | Status: AC
  Administered 2018-05-24: 1.25 mg via RESPIRATORY_TRACT
  Filled 2018-05-23 (×2): qty 0.5

## 2018-05-23 MED ORDER — METOPROLOL TARTRATE 5 MG/5ML IV SOLN: 10.0000 mg | Freq: Once | INTRAVENOUS | Status: DC

## 2018-05-23 MED ORDER — METOPROLOL TARTRATE 5 MG/5ML IV SOLN
5.0000 mg | Freq: Four times a day (QID) | INTRAVENOUS | Status: DC
  Administered 2018-05-23 – 2018-05-24 (×4): 5 mg via INTRAVENOUS
  Filled 2018-05-23 (×4): qty 5

## 2018-05-23 NOTE — Progress Notes (Signed)
[employer] TEAM 1 - Stepdown/ICU TEAM  Arthur [name]  EXH:[id number] DOB: 12-31-1928 DOA: 05/21/2018 PCP: Hulan Fess, MD    Brief Narrative:  84 y.o. male w/ a hx of chronic diastolic CHF, paroxysmal atrial fibrillation on Eliquis, CVA with left hemiparesis, and CKD stage III who presented to the ED w/ the acute onset of lethargy, shaking chills, and nausea with one episode of nonbloody vomiting.    In the ED he was found to be tachycardic in the 120s, mildly tachypneic, and w/ SBP in the 90s to low 100s. EKG featured atrial fibrillation with RVR at 126. Lactic acid was elevated to 3.14.   Significant Events: 12/9 admit   Subjective: The patient is awake and alert.  He reports feeling weak in general.  He reports a very poor appetite.  He denies chest pain shortness of breath nausea vomiting or abdominal pain.  Assessment & Plan:  Sepsis due to Pyelonephritis w/ E coli bacteremia   Continue directed antibiotic therapy - clinically much improved  Chronic Atrial fibrillation with acute RVR  Chronically on Eliquis but not on rate-control agent - CHADS-VASc is 6 - rate proving difficult to control - initiated scheduled IV lopressor and follow - may have to consider cardizem gtt short term    Hx Mitral Valve Repair + Maze   History of CVA due to L atrial appendage thrombus  Stable left-sided hemiparesis    CAD s/p CABG 2005 Cardiac cath 01/24/15 showed severe 3v obstructive CAD with patent LIMA-LAD, SVG-diag, SVG-OM3 and occluded SVG-PDA > successful stenting of prox RCA with a DES - no anginal complaints - troponin normal   AKI on CKD stage III  Baseline crt 1.1-1.2 in February 2019  - crt beginning to improve - follow   Hypokalemia  Corrected   Hypomagnesemia  Corrected   DVT prophylaxis: eliquis  Code Status: FULL CODE Family Communication: no family present  Disposition Plan: SDU  Consultants:  none  Antimicrobials:  Aztreonam 12/8 > 12/9 Flagyl 12/8 >  12/9 Vanc 12/8 > 12/9 Rocephin 12/9 >  Objective: Blood pressure (!) 160/103, pulse (!) 115, temperature 98.3 F (36.8 C), temperature source Oral, resp. rate (!) 24, height 5\' 7"  (1.702 m), weight 81.1 kg, SpO2 98 %.  Intake/Output Summary (Last 24 hours) at 05/23/2018 1612 Last data filed at 05/23/2018 1611 Gross per 24 hour  Intake 1815.93 ml  Output 2610 ml  Net -794.07 ml   Filed Weights   05/21/18 2200 05/23/18 0411  Weight: 74 kg 81.1 kg    Examination: General: No acute respiratory distress - alert and conversant  Lungs: Clear to auscultation bilaterally without wheezes or rhonchi, with good air movement throughout all fields Cardiovascular: tachycardic at 114 - irreg irreg - no M or rub  Abdomen: Nontender, nondistended, soft, bowel sounds present, no organomegaly, no rebound, no ascites Extremities: trace B LE edema   CBC: Recent Labs  Lab 05/21/18 2110 05/22/18 0202 05/23/18 0502  WBC 6.7 11.7* 9.8  NEUTROABS 6.5 11.0*  --   HGB 14.2 12.4* 13.2  HCT 44.2 38.4* 41.7  MCV 90.6 90.8 91.2  PLT 172 164 381*   Basic Metabolic Panel: Recent Labs  Lab 05/21/18 2110 05/22/18 0202 05/23/18 0502  NA 137 136 137  K 3.3* 3.3* 4.3  CL 106 108 109  CO2 18* 19* 19*  GLUCOSE 199* 156* 97  BUN 19 18 17   CREATININE 1.33* 1.49* 1.38*  CALCIUM 8.6* 8.2* 8.5*  MG  --  1.5* 2.0   GFR: Estimated Creatinine Clearance: 38.5 mL/min (A) (by C-G formula based on SCr of 1.38 mg/dL (H)).  Liver Function Tests: Recent Labs  Lab 05/21/18 2110 05/22/18 0202 05/23/18 0502  AST 34 38 44*  ALT 23 20 27   ALKPHOS 51 37* 46  BILITOT 1.3* 0.9 1.2  PROT 6.1* 5.2* 5.4*  ALBUMIN 3.4* 3.0* 2.9*    Coagulation Profile: Recent Labs  Lab 05/21/18 2110  INR 1.29    HbA1C: Hgb A1c MFr Bld  Date/Time Value Ref Range Status  07/13/2017 03:50 AM 5.6 4.8 - 5.6 % Final    Comment:    (NOTE) Pre diabetes:          5.7%-6.4% Diabetes:              >6.4% Glycemic control for    <7.0% adults with diabetes   07/12/2017 03:59 AM 5.6 4.8 - 5.6 % Final    Comment:    (NOTE) Pre diabetes:          5.7%-6.4% Diabetes:              >6.4% Glycemic control for   <7.0% adults with diabetes     CBG: Recent Labs  Lab 05/22/18 1100 05/22/18 1704 05/22/18 2225 05/23/18 0807 05/23/18 1224  GLUCAP 90 104* 122* 92 108*    Recent Results (from the past 240 hour(s))  Blood Culture (routine x 2)     Status: None (Preliminary result)   Collection Time: 05/21/18  9:20 PM  Result Value Ref Range Status   Specimen Description BLOOD RIGHT HAND  Final   Special Requests   Final    BOTTLES DRAWN AEROBIC AND ANAEROBIC Blood Culture results may not be optimal due to an inadequate volume of blood received in culture bottles   Culture   Final    NO GROWTH 2 DAYS Performed at [facility], [place] [address], [place], [place] [address]    Report Status PENDING  Incomplete  Blood Culture (routine x 2)     Status: Abnormal (Preliminary result)   Collection Time: 05/21/18  9:40 PM  Result Value Ref Range Status   Specimen Description BLOOD RIGHT HAND  Final   Special Requests   Final    BOTTLES DRAWN AEROBIC AND ANAEROBIC Blood Culture adequate volume   Culture  Setup Time   Final    GRAM NEGATIVE RODS AEROBIC BOTTLE ONLY Organism ID to follow CRITICAL RESULT CALLED TO, READ BACK BY AND VERIFIED WITH: PAHRMD M PHAM [id number] MLM    Culture (A)  Final    ESCHERICHIA COLI SUSCEPTIBILITIES TO FOLLOW Performed at [facility], [place] [address] [place], [place] [address]    Report Status PENDING  Incomplete  Blood Culture ID Panel (Reflexed)     Status: Abnormal   Collection Time: 05/21/18  9:40 PM  Result Value Ref Range Status   Enterococcus species NOT DETECTED NOT DETECTED Final   Listeria monocytogenes NOT DETECTED NOT DETECTED Final   Staphylococcus species NOT DETECTED NOT DETECTED Final   Staphylococcus aureus (BCID) NOT DETECTED NOT DETECTED Final    Streptococcus species NOT DETECTED NOT DETECTED Final   Streptococcus agalactiae NOT DETECTED NOT DETECTED Final   Streptococcus pneumoniae NOT DETECTED NOT DETECTED Final   Streptococcus pyogenes NOT DETECTED NOT DETECTED Final   Acinetobacter baumannii NOT DETECTED NOT DETECTED Final   Enterobacteriaceae species DETECTED (A) NOT DETECTED Final    Comment: Enterobacteriaceae represent a large family  of gram-negative bacteria, not a single organism. CRITICAL RESULT CALLED TO, READ BACK BY AND VERIFIED WITH: PHARMD M PHAM [id number] [phone] MLM    Enterobacter cloacae complex NOT DETECTED NOT DETECTED Final   Escherichia coli DETECTED (A) NOT DETECTED Final    Comment: CRITICAL RESULT CALLED TO, READ BACK BY AND VERIFIED WITH: PHARMD M PHAM [id number] MLM    Klebsiella oxytoca NOT DETECTED NOT DETECTED Final   Klebsiella pneumoniae NOT DETECTED NOT DETECTED Final   Proteus species NOT DETECTED NOT DETECTED Final   Serratia marcescens NOT DETECTED NOT DETECTED Final   Carbapenem resistance NOT DETECTED NOT DETECTED Final   Haemophilus influenzae NOT DETECTED NOT DETECTED Final   Neisseria meningitidis NOT DETECTED NOT DETECTED Final   Pseudomonas aeruginosa NOT DETECTED NOT DETECTED Final   Candida albicans NOT DETECTED NOT DETECTED Final   Candida glabrata NOT DETECTED NOT DETECTED Final   Candida krusei NOT DETECTED NOT DETECTED Final   Candida parapsilosis NOT DETECTED NOT DETECTED Final   Candida tropicalis NOT DETECTED NOT DETECTED Final    Comment: Performed at [facility], [place]. [address], [place], [place] [address]  Urine Culture     Status: Abnormal   Collection Time: 05/22/18 12:48 AM  Result Value Ref Range Status   Specimen Description URINE, RANDOM  Final   Special Requests   Final    NONE Performed at [facility], [place] [address] [place], [place] [address]    Culture MULTIPLE SPECIES PRESENT, SUGGEST RECOLLECTION (A)  Final   Report Status 05/23/2018 FINAL   Final  MRSA PCR Screening     Status: None   Collection Time: 05/22/18  2:54 PM  Result Value Ref Range Status   MRSA by PCR NEGATIVE NEGATIVE Final    Comment:        The GeneXpert MRSA Assay (FDA approved for NASAL specimens only), is one component of a comprehensive MRSA colonization surveillance program. It is not intended to diagnose MRSA infection nor to guide or monitor treatment for MRSA infections. Performed at [facility], [place] [address] [place], [place] [address]      Scheduled Meds: . apixaban  5 mg Oral BID  . famotidine  20 mg Oral Daily  . montelukast  10 mg Oral QHS  . pantoprazole  40 mg Oral QPC breakfast  . polyvinyl alcohol   Left Eye QHS  . rosuvastatin  20 mg Oral q1800  . sodium chloride flush  3 mL Intravenous Q12H   Continuous Infusions: . 0.9 % NaCl with KCl 20 mEq / L 75 mL/hr at 05/23/18 0634  . cefTRIAXone (ROCEPHIN)  IV 2 g (05/22/18 1713)     LOS: 2 days   Cherene Altes, MD [facility] Office  [phone] Pager - Text Page per Amion as per below:  On-Call/Text Page:      [email or web address]  If 7PM-7AM, please contact night-coverage [email or web address] 05/23/2018, 4:12 PM

## 2018-05-23 NOTE — Discharge Instructions (Signed)

## 2018-05-23 NOTE — Progress Notes (Signed)
Patient refusing breathing treatment , even though he is wheezing. I explained to patient the importance of receiving breathing treatments. Patient adamantly refused stating " I see dust particles in my room and that why I will not take breathing treatment" . I personally check his room and did not see any dust particles settled in any part of the room.  Charge nurse made aware.

## 2018-05-23 NOTE — Progress Notes (Signed)
Called to patients room to give a one time neb tx.  Patient states its been dusty in his room all day and refuses to take neb there.  He requests I take him to a conference room or a room upstairs to administer etc.  Seems confused to me but definitely not allowing me to administer tx in his assigned bed at this time.  RN made aware.

## 2018-05-24 ENCOUNTER — Inpatient Hospital Stay (HOSPITAL_COMMUNITY): Payer: Medicare Other

## 2018-05-24 DIAGNOSIS — I4891 Unspecified atrial fibrillation: Secondary | ICD-10-CM

## 2018-05-24 DIAGNOSIS — N179 Acute kidney failure, unspecified: Secondary | ICD-10-CM

## 2018-05-24 LAB — TSH: TSH: 1.161 u[IU]/mL (ref 0.350–4.500)

## 2018-05-24 LAB — CBC
HCT: 39.7 % (ref 39.0–52.0)
HEMOGLOBIN: 12.9 g/dL — AB (ref 13.0–17.0)
MCH: 28.6 pg (ref 26.0–34.0)
MCHC: 32.5 g/dL (ref 30.0–36.0)
MCV: 88 fL (ref 80.0–100.0)
Platelets: 159 10*3/uL (ref 150–400)
RBC: 4.51 MIL/uL (ref 4.22–5.81)
RDW: 14.7 % (ref 11.5–15.5)
WBC: 8.7 10*3/uL (ref 4.0–10.5)
nRBC: 0 % (ref 0.0–0.2)

## 2018-05-24 LAB — COMPREHENSIVE METABOLIC PANEL
ALK PHOS: 45 U/L (ref 38–126)
ALT: 26 U/L (ref 0–44)
AST: 35 U/L (ref 15–41)
Albumin: 3 g/dL — ABNORMAL LOW (ref 3.5–5.0)
Anion gap: 10 (ref 5–15)
BUN: 13 mg/dL (ref 8–23)
CHLORIDE: 105 mmol/L (ref 98–111)
CO2: 19 mmol/L — ABNORMAL LOW (ref 22–32)
Calcium: 8.8 mg/dL — ABNORMAL LOW (ref 8.9–10.3)
Creatinine, Ser: 1.22 mg/dL (ref 0.61–1.24)
GFR calc Af Amer: >60 mL/min (ref >60)
GFR calc non Af Amer: 53 mL/min — ABNORMAL LOW (ref >60)
Glucose, Bld: 106 mg/dL — ABNORMAL HIGH (ref 70–99)
Potassium: 4 mmol/L (ref 3.5–5.1)
Sodium: 134 mmol/L — ABNORMAL LOW (ref 135–145)
Total Bilirubin: 1.1 mg/dL (ref 0.3–1.2)
Total Protein: 5.8 g/dL — ABNORMAL LOW (ref 6.5–8.1)

## 2018-05-24 LAB — CULTURE, BLOOD (ROUTINE X 2): Special Requests: ADEQUATE

## 2018-05-24 LAB — MAGNESIUM: Magnesium: 1.8 mg/dL (ref 1.7–2.4)

## 2018-05-24 MED ORDER — MAGNESIUM SULFATE 2 GM/50ML IV SOLN
2.0000 g | Freq: Once | INTRAVENOUS | Status: AC
  Administered 2018-05-24: 2 g via INTRAVENOUS
  Filled 2018-05-24: qty 50

## 2018-05-24 MED ORDER — METOPROLOL TARTRATE 5 MG/5ML IV SOLN: 5.0000 mg | Freq: Four times a day (QID) | INTRAVENOUS | Status: DC | PRN

## 2018-05-24 MED ORDER — METOPROLOL TARTRATE 25 MG PO TABS
25.0000 mg | ORAL_TABLET | Freq: Three times a day (TID) | ORAL | Status: DC
  Administered 2018-05-24 – 2018-05-25 (×3): 25 mg via ORAL
  Filled 2018-05-24 (×4): qty 1

## 2018-05-24 NOTE — Progress Notes (Signed)
PT Cancellation Note  Patient Details Name: Arthur [name] MRN: [id number] DOB: [birth year]   Cancelled Treatment:    Reason Eval/Treat Not Completed: Medical issues which prohibited therapy(Chart reviewed, most recent BP: 185/134mm Hg. Holding PT evalaution at this time. Will attempt again at later date/time. )  12:42 PM, 05/24/18 Etta Grandchild, PT, DPT Physical Therapist - [place] [phone] (Pager)  [phone] (Office)      Dawnn Nam C 05/24/2018, 12:42 PM

## 2018-05-24 NOTE — Progress Notes (Signed)
Schorr, NP text paged for notification of eight beats of VT per tele. Patient sleeping. BP 134/84. HR 80s. Schorr, NP notified. Patient getting Metoprolol dose now. Will continue to monitor.

## 2018-05-24 NOTE — Care Management Important Message (Signed)
Important Message  Patient Details  Name: Arthur [name] MRN: [id number] Date of Birth: 10-08-1928   Medicare Important Message Given:  Yes    [name] 05/24/2018, 2:23 PM

## 2018-05-24 NOTE — Progress Notes (Signed)
PROGRESS NOTE        PATIENT DETAILS Name: Arthur [name] Age: 84 y.o. Sex: male Date of Birth: 05-26-1929 Admit Date: 05/21/2018 Admitting Physician Vianne Bulls, MD [id number], Lennette Bihari, MD  Brief Narrative: Patient is a 84 y.o. male with history of prior CVA with chronic left-sided hemiplegia, CKD stage III, PAF on Eliquis, chronic diastolic heart failure presented with sepsis secondary to E. coli bacteremia.  See below for further details  Subjective: Awake-alert-denies any chest pain or shortness of breath.  Lying comfortably in bed.  Assessment/Plan: Sepsis secondary to E. coli bacteremia/pyelonephritis: Sepsis pathophysiology has significantly improved-remains on IV Rocephin-awaiting sensitivity data.  AKI: Likely hemodynamically mediated-improving with supportive care.  Chronic atrial fibrillation with RVR: Although heart rate better-heart rate still in the low 100s range-start oral metoprolol-change IV metoprolol to as needed.  Continue Eliquis.  Reviewed prior cardiology notes-he is status post Maze procedure in 2005 when he had CABG.  History of CAD status post CABG in 2005: No anginal symptoms-follow.  History of CVA with chronic left-sided hemiplegia: At [facility].  Hypertension: Appears uncontrolled-hold benazepril-starting oral metoprolol-reassess tomorrow.  Hypokalemia/hypomagnesemia: Repleted-recheck periodically  Debility/deconditioning: Has significant left-sided hemiparesis at baseline-appears to be more weak than usual baseline-likely secondary to acute illness-have ordered PT evaluation  DVT Prophylaxis: Full dose anticoagulation with Eliquis  Code Status: Full code   Family Communication: None at bedside  Disposition Plan: Remain inpatient-requires several more days of hospitalization before discharge home-await PT evaluation.  Antimicrobial agents: Anti-infectives (From admission, onward)   Start     Dose/Rate  Route Frequency Ordered Stop   05/22/18 1700  cefTRIAXone (ROCEPHIN) 2 g in sodium chloride 0.9 % 100 mL IVPB     2 g 200 mL/hr over 30 Minutes Intravenous Every 24 hours 05/22/18 1550     05/22/18 1200  vancomycin (VANCOCIN) IVPB 1000 mg/200 mL premix  Status:  Discontinued     1,000 mg 200 mL/hr over 60 Minutes Intravenous Every 24 hours 05/21/18 2214 05/22/18 1550   05/22/18 1000  vancomycin (VANCOCIN) IVPB 1000 mg/200 mL premix  Status:  Discontinued     1,000 mg 200 mL/hr over 60 Minutes Intravenous Every 12 hours 05/21/18 2211 05/21/18 2214   05/22/18 0700  aztreonam (AZACTAM) 1 g in sodium chloride 0.9 % 100 mL IVPB  Status:  Discontinued     1 g 200 mL/hr over 30 Minutes Intravenous Every 8 hours 05/21/18 2211 05/22/18 1550   05/22/18 0200  metroNIDAZOLE (FLAGYL) IVPB 500 mg  Status:  Discontinued     500 mg 100 mL/hr over 60 Minutes Intravenous Every 8 hours 05/22/18 0058 05/22/18 1550   05/21/18 2115  aztreonam (AZACTAM) 2 g in sodium chloride 0.9 % 100 mL IVPB     2 g 200 mL/hr over 30 Minutes Intravenous  Once 05/21/18 2112 05/21/18 2335   05/21/18 2115  vancomycin (VANCOCIN) IVPB 1000 mg/200 mL premix     1,000 mg 200 mL/hr over 60 Minutes Intravenous  Once 05/21/18 2112 05/21/18 2306      Procedures: None  CONSULTS:  None  Time spent: 35 minutes-Greater than 50% of this time was spent in counseling, explanation of diagnosis, planning of further management, and coordination of care.  MEDICATIONS: Scheduled Meds: . apixaban  5 mg Oral BID  . famotidine  20 mg Oral Daily  . metoprolol tartrate  10 mg Intravenous Once  . metoprolol tartrate  5 mg Intravenous Q6H  . montelukast  10 mg Oral QHS  . pantoprazole  40 mg Oral QPC breakfast  . polyvinyl alcohol   Left Eye QHS  . rosuvastatin  20 mg Oral q1800  . sodium chloride flush  3 mL Intravenous Q12H   Continuous Infusions: . 0.9 % NaCl with KCl 20 mEq / L 50 mL/hr at 05/24/18 1208  . cefTRIAXone (ROCEPHIN)   IV 2 g (05/23/18 1713)   PRN Meds:.acetaminophen, diclofenac sodium, guaiFENesin, HYDROcodone-acetaminophen, ondansetron **OR** ondansetron (ZOFRAN) IV, polyvinyl alcohol, senna-docusate   PHYSICAL EXAM: Vital signs: Vitals:   05/24/18 0704 05/24/18 0808 05/24/18 1126 05/24/18 1248  BP: (!) 144/104  (!) 185/121   Pulse: (!) 108  (!) 130   Resp: 20  (!) 24   Temp:  98.1 F (36.7 C) 98.5 F (36.9 C) 97.9 F (36.6 C)  TempSrc:  Oral Oral Oral  SpO2: 98%  100%   Weight:      Height:       Filed Weights   05/21/18 2200 05/23/18 0411  Weight: 74 kg 81.1 kg   Body mass index is 28 kg/m.   General appearance :Awake, alert, not in any distress. Speech Clear.  Eyes:Pink conjunctiva HEENT: Atraumatic and Normocephalic Neck: supple Resp:Good air entry bilaterally, no added sounds  CVS: S1 S2 irregular-tachycardic GI: Bowel sounds present, Non tender and not distended with no gaurding, rigidity or rebound.No organomegaly Extremities: B/L Lower Ext shows no edema, both legs are warm to touch Neurology: Left-sided hemiplegia Psychiatric: Normal judgment and insight. Alert and oriented x 3. Normal mood. Musculoskeletal:No digital cyanosis Skin:No Rash, warm and dry Wounds:N/A  I have personally reviewed following labs and imaging studies  LABORATORY DATA: CBC: Recent Labs  Lab 05/21/18 2110 05/22/18 0202 05/23/18 0502 05/24/18 0509  WBC 6.7 11.7* 9.8 8.7  NEUTROABS 6.5 11.0*  --   --   HGB 14.2 12.4* 13.2 12.9*  HCT 44.2 38.4* 41.7 39.7  MCV 90.6 90.8 91.2 88.0  PLT 172 164 137* 563    Basic Metabolic Panel: Recent Labs  Lab 05/21/18 2110 05/22/18 0202 05/23/18 0502 05/24/18 0509  NA 137 136 137 134*  K 3.3* 3.3* 4.3 4.0  CL 106 108 109 105  CO2 18* 19* 19* 19*  GLUCOSE 199* 156* 97 106*  BUN 19 18 17 13   CREATININE 1.33* 1.49* 1.38* 1.22  CALCIUM 8.6* 8.2* 8.5* 8.8*  MG  --  1.5* 2.0 1.8    GFR: Estimated Creatinine Clearance: 43.5 mL/min (by C-G  formula based on SCr of 1.22 mg/dL).  Liver Function Tests: Recent Labs  Lab 05/21/18 2110 05/22/18 0202 05/23/18 0502 05/24/18 0509  AST 34 38 44* 35  ALT 23 20 27 26   ALKPHOS 51 37* 46 45  BILITOT 1.3* 0.9 1.2 1.1  PROT 6.1* 5.2* 5.4* 5.8*  ALBUMIN 3.4* 3.0* 2.9* 3.0*   No results for input(s): LIPASE, AMYLASE in the last 168 hours. No results for input(s): AMMONIA in the last 168 hours.  Coagulation Profile: Recent Labs  Lab 05/21/18 2110  INR 1.29    Cardiac Enzymes: No results for input(s): CKTOTAL, CKMB, CKMBINDEX, TROPONINI in the last 168 hours.  BNP (last 3 results) No results for input(s): PROBNP in the last 8760 hours.  HbA1C: No results for input(s): HGBA1C in the last 72 hours.  CBG: Recent Labs  Lab 05/22/18 1100 05/22/18 1704 05/22/18 2225 05/23/18 0807 05/23/18 1224  GLUCAP 90 104* 122* 92 108*    Lipid Profile: No results for input(s): CHOL, HDL, LDLCALC, TRIG, CHOLHDL, LDLDIRECT in the last 72 hours.  Thyroid Function Tests: Recent Labs    05/24/18 0509  TSH 1.161    Anemia Panel: No results for input(s): VITAMINB12, FOLATE, FERRITIN, TIBC, IRON, RETICCTPCT in the last 72 hours.  Urine analysis:    Component Value Date/Time   COLORURINE YELLOW 05/22/2018 0045   APPEARANCEUR CLOUDY (A) 05/22/2018 0045   LABSPEC 1.014 05/22/2018 0045   PHURINE 5.0 05/22/2018 0045   GLUCOSEU NEGATIVE 05/22/2018 0045   HGBUR MODERATE (A) 05/22/2018 0045   BILIRUBINUR NEGATIVE 05/22/2018 0045   KETONESUR NEGATIVE 05/22/2018 0045   PROTEINUR NEGATIVE 05/22/2018 0045   UROBILINOGEN 1.0 05/13/2011 2200   NITRITE NEGATIVE 05/22/2018 0045   LEUKOCYTESUR LARGE (A) 05/22/2018 0045    Sepsis Labs: Lactic Acid, Venous    Component Value Date/Time   LATICACIDVEN 1.6 05/22/2018 0508    MICROBIOLOGY: Recent Results (from the past 240 hour(s))  Blood Culture (routine x 2)     Status: None (Preliminary result)   Collection Time: 05/21/18  9:20 PM    Result Value Ref Range Status   Specimen Description BLOOD RIGHT HAND  Final   Special Requests   Final    BOTTLES DRAWN AEROBIC AND ANAEROBIC Blood Culture results may not be optimal due to an inadequate volume of blood received in culture bottles   Culture   Final    NO GROWTH 2 DAYS Performed at [facility], [place] [address] [place], [place] [address]    Report Status PENDING  Incomplete  Blood Culture (routine x 2)     Status: Abnormal   Collection Time: 05/21/18  9:40 PM  Result Value Ref Range Status   Specimen Description BLOOD RIGHT HAND  Final   Special Requests   Final    BOTTLES DRAWN AEROBIC AND ANAEROBIC Blood Culture adequate volume   Culture  Setup Time   Final    GRAM NEGATIVE RODS AEROBIC BOTTLE ONLY CRITICAL RESULT CALLED TO, READ BACK BY AND VERIFIED WITH: [place] [phone] MLM Performed at [facility], [place] [address], [place], [place] [address]    Culture ESCHERICHIA COLI (A)  Final   Report Status 05/24/2018 FINAL  Final   Organism ID, Bacteria ESCHERICHIA COLI  Final      Susceptibility   Escherichia coli - MIC*    AMPICILLIN <=2 SENSITIVE Sensitive     CEFAZOLIN <=4 SENSITIVE Sensitive     CEFEPIME <=1 SENSITIVE Sensitive     CEFTAZIDIME <=1 SENSITIVE Sensitive     CEFTRIAXONE <=1 SENSITIVE Sensitive     CIPROFLOXACIN <=0.25 SENSITIVE Sensitive     GENTAMICIN <=1 SENSITIVE Sensitive     IMIPENEM <=0.25 SENSITIVE Sensitive     TRIMETH/SULFA <=20 SENSITIVE Sensitive     AMPICILLIN/SULBACTAM <=2 SENSITIVE Sensitive     PIP/TAZO <=4 SENSITIVE Sensitive     Extended ESBL NEGATIVE Sensitive     * ESCHERICHIA COLI  Blood Culture ID Panel (Reflexed)     Status: Abnormal   Collection Time: 05/21/18  9:40 PM  Result Value Ref Range Status   Enterococcus species NOT DETECTED NOT DETECTED Final   Listeria monocytogenes NOT DETECTED NOT DETECTED Final   Staphylococcus species NOT DETECTED NOT DETECTED Final   Staphylococcus aureus (BCID)  NOT DETECTED NOT DETECTED Final   Streptococcus species NOT DETECTED NOT DETECTED Final   Streptococcus agalactiae NOT  DETECTED NOT DETECTED Final   Streptococcus pneumoniae NOT DETECTED NOT DETECTED Final   Streptococcus pyogenes NOT DETECTED NOT DETECTED Final   Acinetobacter baumannii NOT DETECTED NOT DETECTED Final   Enterobacteriaceae species DETECTED (A) NOT DETECTED Final    Comment: Enterobacteriaceae represent a large family of gram-negative bacteria, not a single organism. CRITICAL RESULT CALLED TO, READ BACK BY AND VERIFIED WITH: PHARMD M PHAM [phone] MLM    Enterobacter cloacae complex NOT DETECTED NOT DETECTED Final   Escherichia coli DETECTED (A) NOT DETECTED Final    Comment: CRITICAL RESULT CALLED TO, READ BACK BY AND VERIFIED WITH: PHARMD M PHAM [phone] MLM    Klebsiella oxytoca NOT DETECTED NOT DETECTED Final   Klebsiella pneumoniae NOT DETECTED NOT DETECTED Final   Proteus species NOT DETECTED NOT DETECTED Final   Serratia marcescens NOT DETECTED NOT DETECTED Final   Carbapenem resistance NOT DETECTED NOT DETECTED Final   Haemophilus influenzae NOT DETECTED NOT DETECTED Final   Neisseria meningitidis NOT DETECTED NOT DETECTED Final   Pseudomonas aeruginosa NOT DETECTED NOT DETECTED Final   Candida albicans NOT DETECTED NOT DETECTED Final   Candida glabrata NOT DETECTED NOT DETECTED Final   Candida krusei NOT DETECTED NOT DETECTED Final   Candida parapsilosis NOT DETECTED NOT DETECTED Final   Candida tropicalis NOT DETECTED NOT DETECTED Final    Comment: Performed at [facility], [place]. [address] [place], [place] [address]  Urine Culture     Status: Abnormal   Collection Time: 05/22/18 12:48 AM  Result Value Ref Range Status   Specimen Description URINE, RANDOM  Final   Special Requests   Final    NONE Performed at [facility], [place] [address], [place], [place] [address]    Culture MULTIPLE SPECIES PRESENT, SUGGEST RECOLLECTION (A)  Final    Report Status 05/23/2018 FINAL  Final  MRSA PCR Screening     Status: None   Collection Time: 05/22/18  2:54 PM  Result Value Ref Range Status   MRSA by PCR NEGATIVE NEGATIVE Final    Comment:        The GeneXpert MRSA Assay (FDA approved for NASAL specimens only), is one component of a comprehensive MRSA colonization surveillance program. It is not intended to diagnose MRSA infection nor to guide or monitor treatment for MRSA infections. Performed at [facility], [place] [address], [place], [place] [address]     RADIOLOGY STUDIES/RESULTS: Dg Chest Port 1 View  Result Date: 05/24/2018 CLINICAL DATA:  Cough, altered mental status. EXAM: PORTABLE CHEST 1 VIEW COMPARISON:  05/21/2018 FINDINGS: Cardiomegaly. Prior CABG and valve replacement. Stable blunting of the right costophrenic angle. Mild vascular congestion. No acute bony abnormality. IMPRESSION: Cardiomegaly with vascular congestion. Stable chronic pleural thickening and scarring at the right lung base. Electronically Signed   By: Rolm Baptise M.D.   On: 05/24/2018 09:35   Dg Chest Port 1 View  Result Date: 05/21/2018 CLINICAL DATA:  Lethargy, chills EXAM: PORTABLE CHEST 1 VIEW COMPARISON:  07/14/2017 FINDINGS: Chronic pleural-parenchymal scarring along the lateral right lower hemithorax. Left lung is clear. No pleural effusion or pneumothorax. Mild cardiomegaly.  Insert cabbage Median sternotomy. IMPRESSION: No evidence of acute cardiopulmonary disease. Chronic pleural-parenchymal scarring along the lateral right lower hemithorax. Electronically Signed   By: Julian Hy M.D.   On: 05/21/2018 21:48     LOS: 3 days   Oren Binet, MD  [facility]  If 7PM-7AM, please contact night-coverage  Please page  via [email or web address] [email or web address]-click on MD name and type text message  05/24/2018, 1:53 PM

## 2018-05-25 LAB — BASIC METABOLIC PANEL
Anion gap: 12 (ref 5–15)
BUN: 14 mg/dL (ref 8–23)
CO2: 20 mmol/L — ABNORMAL LOW (ref 22–32)
Calcium: 8.9 mg/dL (ref 8.9–10.3)
Chloride: 104 mmol/L (ref 98–111)
Creatinine, Ser: 1.17 mg/dL (ref 0.61–1.24)
GFR calc Af Amer: >60 mL/min (ref >60)
GFR calc non Af Amer: 56 mL/min — ABNORMAL LOW (ref >60)
Glucose, Bld: 91 mg/dL (ref 70–99)
Potassium: 3.7 mmol/L (ref 3.5–5.1)
Sodium: 136 mmol/L (ref 135–145)

## 2018-05-25 LAB — MAGNESIUM: MAGNESIUM: 2.2 mg/dL (ref 1.7–2.4)

## 2018-05-25 MED ORDER — CEFDINIR 300 MG PO CAPS
300.0000 mg | ORAL_CAPSULE | Freq: Two times a day (BID) | ORAL | Status: DC
  Administered 2018-05-25: 300 mg via ORAL
  Filled 2018-05-25 (×2): qty 1

## 2018-05-25 MED ORDER — CEFDINIR 300 MG PO CAPS: 300.0000 mg | ORAL_CAPSULE | Freq: Two times a day (BID) | ORAL | 0 refills | Status: DC

## 2018-05-25 MED ORDER — METOPROLOL TARTRATE 25 MG PO TABS: 37.5000 mg | ORAL_TABLET | Freq: Two times a day (BID) | ORAL | 0 refills | Status: DC

## 2018-05-25 MED FILL — CEFDINIR 300 MG CAPSULE: 300 | 5 days supply | Qty: 10 | Fill #0

## 2018-05-25 MED FILL — METOPROLOL TARTRATE 25 MG T: 25 | 30 days supply | Qty: 90 | Fill #0

## 2018-05-25 NOTE — Progress Notes (Signed)
Physical Therapy Evaluation Patient Details Name: Arthur [name] MRN: [id number] DOB: [birth year] Today's Date: 05/25/2018   History of Present Illness  pt is an 84 y/o male with pmh significant for CAD, dCHF, pAFIB, CVA with left hemiparesis, CKD3 presenting to the ED for evaluation of acute onset lethargy, chills and nausea/vomiting.  Found to be in sepsis due to E coli bacteremia/pyelonephritis.  Clinical Impression  Pt is not at  baseline functioning, but  should be safe at home with family assist. There are no further acute PT needs.  Will sign off at this time.     Follow Up Recommendations Home health PT;Supervision/Assistance - 24 hour    Equipment Recommendations  None recommended by PT    Recommendations for Other Services       Precautions / Restrictions Precautions Precautions: Fall      Mobility  Bed Mobility Overal bed mobility: Needs Assistance Bed Mobility: Rolling;Sidelying to Sit Rolling: Min guard(with rail and HOB up) Sidelying to sit: Min assist;HOB elevated(use of rail)       General bed mobility comments: rolled well, but pt had difficulty coming up as L UE not as functional as normal  Transfers Overall transfer level: Needs assistance   Transfers: Sit to/from [employer] Transfers Sit to Stand: Max assist   Squat pivot transfers: Max assist     General transfer comment: initially pt able to give more assist, but L LE fatigues quickly  Ambulation/Gait                Stairs            Wheelchair Mobility    Modified Rankin (Stroke Patients Only) Modified Rankin (Stroke Patients Only) Pre-Morbid Rankin Score: Moderately severe disability Modified Rankin: Severe disability     Balance Overall balance assessment: Needs assistance Sitting-balance support: Single extremity supported;Bilateral upper extremity supported Sitting balance-Leahy Scale: Poor(to fair) Sitting balance - Comments: listing to the left initially  then finally could sit EOB without assist     Standing balance-Leahy Scale: Zero Standing balance comment: sit to stand for pericare                             Pertinent Vitals/Pain Pain Assessment: No/denies pain    Home Living Family/patient expects to be discharged to:: Private residence   Available Help at Discharge: Family;Available 24 hours/day Type of Home: House Home Access: Stairs to enter;Ramped entrance     Home Layout: One level Home Equipment: Wheelchair - manual      Prior Function Level of Independence: Independent               Hand Dominance   Dominant Hand: Right    Extremity/Trunk Assessment   Upper Extremity Assessment Upper Extremity Assessment: LUE deficits/detail(L UE moves synergistically, minimally functional for transfe)    Lower Extremity Assessment Lower Extremity Assessment: LLE deficits/detail LLE Deficits / Details: stiff, resistant to movement. Moves in synergy with little ability to isolate.  gross extention 3 to 3+ LLE Coordination: decreased fine motor       Communication   Communication: Other (comment)  Cognition Arousal/Alertness: Awake/alert Behavior During Therapy: WFL for tasks assessed/performed Overall Cognitive Status: Within Functional Limits for tasks assessed(followed commands well, quick witted)  General Comments      Exercises     Assessment/Plan    PT Assessment Patient needs continued PT services  PT Problem List Decreased strength;Decreased activity tolerance;Decreased balance;Decreased mobility;Decreased coordination       PT Treatment Interventions      PT Goals (Current goals can be found in the Care Plan section)  Acute Rehab PT Goals Patient Stated Goal: home PT Goal Formulation: All assessment and education complete, DC therapy    Frequency     Barriers to discharge        Co-evaluation                AM-PAC PT "6 Clicks" Mobility  Outcome Measure Help needed turning from your back to your side while in a flat bed without using bedrails?: A Little Help needed moving from lying on your back to sitting on the side of a flat bed without using bedrails?: A Little Help needed moving to and from a bed to a chair (including a wheelchair)?: A Lot Help needed standing up from a chair using your arms (e.g., wheelchair or bedside chair)?: A Lot Help needed to walk in hospital room?: Total Help needed climbing 3-5 steps with a railing? : Total 6 Click Score: 12    End of Session   Activity Tolerance: Patient tolerated treatment well Patient left: in chair;with call bell/phone within reach Nurse Communication: Mobility status PT Visit Diagnosis: Unsteadiness on feet (R26.81);Other abnormalities of gait and mobility (R26.89);Other symptoms and signs involving the nervous system (R29.898)    Time: 1324-4010 PT Time Calculation (min) (ACUTE ONLY): 29 min   Charges:   PT Evaluation $PT Eval Moderate Complexity: 1 Mod PT Treatments $Therapeutic Activity: 8-22 mins        05/25/2018  Donnella Sham, PT Acute Rehabilitation Services [phone]  (pager) [phone]  (office)  [name] 05/25/2018, 12:30 PM

## 2018-05-25 NOTE — Discharge Summary (Signed)
PATIENT DETAILS Name: Arthur [name] Age: 84 y.o. Sex: male Date of Birth: November 28, 1928 MRN: [id number]. Admitting Physician: Vianne Bulls, MD QBH:[id number], Lennette Bihari, MD  Admit Date: 05/21/2018 Discharge date: 05/25/2018  Recommendations for Outpatient Follow-up:  1. Follow up with PCP in 1-2 weeks 2. Please obtain BMP/CBC in one week 3. Please repeat blood cultures, once patient completes a course of antimicrobial therapy-to ensure resolution of bacteremia.  Admitted From:  Home  Disposition: Home with home health services    Home Health: Yes  Equipment/Devices: None  Discharge Condition: Stable  CODE STATUS: FULL CODE  Diet recommendation:  Heart Healthy  Brief Summary: See H&P, Labs, Consult and Test reports for all details in brief, Patient is a 84 y.o. male with history of prior CVA with chronic left-sided hemiplegia, CKD stage III, PAF on Eliquis, chronic diastolic heart failure presented with sepsis secondary to E. coli bacteremia.  See below for further details  Brief Hospital Course: Sepsis secondary to E. coli bacteremia/pyelonephritis: Sepsis pathophysiology has resolved-patient feels significantly better and is actually requesting to be discharged home.  Was treated with IV Rocephin-we will transition to [facility] on discharge.  Bacteremia is thought likely to have originated from UTI/pyelonephritis-his abdominal exam is completely benign with no indication of any biliary source at this time-LFTs are also stable.  Since clinically improved-stable for discharge.  AKI: Likely hemodynamically mediated-resolved with supportive care and resolution of sepsis pathophysiology..  Chronic atrial fibrillation with RVR: Hospital course complicated by RVR-has been started on scheduled oral metoprolol with good rate control.  Continue Eliquis.  Stable for outpatient follow-up with his primary cardiologist. Reviewed prior cardiology notes-he is status post Maze procedure in  2005 when he had CABG.  History of CAD status post CABG in 2005: No anginal symptoms-follow.  History of CVA with chronic left-sided hemiplegia: At [facility].  Hypertension:  Blood pressure better controlled with metoprolol-continue to hold benazepril on discharge.  Hypokalemia/hypomagnesemia: Repleted-recheck periodically as outpatient  Debility/deconditioning: Has significant left-sided hemiparesis at baseline-appears to be more weak than usual baseline-likely secondary to acute illness-have ordered home health services.  Patient is not keen on going to SNF.  Note-Spoke to granddaughter over the phone and relayed to her regarding plans of discharge later today.  Procedures/Studies: None  Discharge Diagnoses:  Principal Problem:   Sepsis ([place]) Active Problems:   Hypertension   Coronary artery disease   Paroxysmal atrial fibrillation with RVR ([facility])   CKD (chronic kidney disease), stage III ([place])   History of completed stroke   Hypokalemia   Hyperglycemia   Pressure injury of skin   Discharge Instructions:  Activity:  As tolerated with Full fall precautions use walker/cane & assistance as needed   Discharge Instructions    Call MD for:  severe uncontrolled pain   Complete by:  As directed    Call MD for:  temperature >100.4   Complete by:  As directed    Diet - low sodium heart healthy   Complete by:  As directed    Discharge instructions   Complete by:  As directed    Follow with Primary MD  Hulan Fess, MD in 1 week  Please get a complete blood count and chemistry panel checked by your Primary MD at your next visit, and again as instructed by your Primary MD.  Get Medicines reviewed and adjusted: Please take all your medications with you for your next visit with your Primary MD  Laboratory/radiological data: Please request your Primary MD to  go over all hospital tests and procedure/radiological results at the follow up, please ask your Primary  MD to get all Hospital records sent to his/her office.  In some cases, they will be blood work, cultures and biopsy results pending at the time of your discharge. Please request that your primary care M.D. follows up on these results.  Also Note the following: If you experience worsening of your admission symptoms, develop shortness of breath, life threatening emergency, suicidal or homicidal thoughts you must seek medical attention immediately by calling 911 or calling your MD immediately  if symptoms less severe.  You must read complete instructions/literature along with all the possible adverse reactions/side effects for all the Medicines you take and that have been prescribed to you. Take any new Medicines after you have completely understood and accpet all the possible adverse reactions/side effects.   Do not drive when taking Pain medications or sleeping medications (Benzodaizepines)  Do not take more than prescribed Pain, Sleep and Anxiety Medications. It is not advisable to combine anxiety,sleep and pain medications without talking with your primary care practitioner  Special Instructions: If you have smoked or chewed Tobacco  in the last 2 yrs please stop smoking, stop any regular Alcohol  and or any Recreational drug use.  Wear Seat belts while driving.  Please note: You were cared for by a hospitalist during your hospital stay. Once you are discharged, your primary care physician will handle any further medical issues. Please note that NO REFILLS for any discharge medications will be authorized once you are discharged, as it is imperative that you return to your primary care physician (or establish a relationship with a primary care physician if you do not have one) for your post hospital discharge needs so that they can reassess your need for medications and monitor your lab values.   Increase activity slowly   Complete by:  As directed      Allergies as of 05/25/2018       Reactions   Tape Other (See Comments)   PATIENT IS TAKING COUMADIN; HIS SKIN TEARS & BRUISES EASILY; PLEASE USE COBAN WRAP OR AN ALTERNATIVE TO MEDICAL TAPE!!   Lasix [furosemide] Rash   Penicillins Rash   Has patient had a PCN reaction causing immediate rash, facial/tongue/throat swelling, SOB or lightheadedness with hypotension: Yes Has patient had a PCN reaction causing severe rash involving mucus membranes or skin necrosis: Unknown Has patient had a PCN reaction that required hospitalization: Unknown Has patient had a PCN reaction occurring within the last 10 years: unknown If all of the above answers are "NO", then may proceed with Cephalosporin use.   Sulfa Drugs Cross Reactors Rash      Medication List    STOP taking these medications   benazepril 5 MG tablet Commonly known as:  LOTENSIN     TAKE these medications   acetaminophen 650 MG CR tablet Commonly known as:  TYLENOL Take 650 mg by mouth daily as needed (knee pain).   apixaban 5 MG Tabs tablet Commonly known as:  ELIQUIS Take 1 tablet (5 mg total) by mouth 2 (two) times daily. What changed:    when to take this  additional instructions   REFRESH P.M. OP Place 1 application into the left eye at bedtime.   artificial tears Oint ophthalmic ointment Commonly known as:  LACRILUBE Place into the left eye at bedtime.   cefdinir 300 MG capsule Commonly known as:  OMNICEF Take 1 capsule (300 mg total) by  mouth every 12 (twelve) hours.   diclofenac sodium 1 % Gel Commonly known as:  VOLTAREN Apply 2 g topically 4 (four) times daily. What changed:    when to take this  reasons to take this   guaiFENesin 600 MG 12 hr tablet Commonly known as:  MUCINEX Take 600 mg by mouth daily.   metoprolol tartrate 25 MG tablet Commonly known as:  LOPRESSOR Take 1.5 tablets (37.5 mg total) by mouth 2 (two) times daily.   montelukast 10 MG tablet Commonly known as:  SINGULAIR Take 1 tablet (10 mg total) by mouth at  bedtime. What changed:  when to take this   NITROSTAT 0.4 MG SL tablet Generic drug:  nitroGLYCERIN PLACE 1 TABLET (0.4 MG TOTAL) UNDER THE TONGUE EVERY 5 (FIVE) MINUTESAS NEEDED FOR CHEST PAIN (UP TO 3 DOSES). What changed:  See the new instructions.   pantoprazole 40 MG tablet Commonly known as:  PROTONIX Take 1 tablet (40 mg total) by mouth daily. What changed:  when to take this   phosphorus 155-852-130 MG tablet Commonly known as:  K PHOS NEUTRAL Take 1 tablet (250 mg total) by mouth daily. What changed:  when to take this   polyvinyl alcohol 1.4 % ophthalmic solution Commonly known as:  LIQUIFILM TEARS Place 1-2 drops into the left eye 4 (four) times daily -  with meals and at bedtime.   ranitidine 150 MG tablet Commonly known as:  ZANTAC Take 1 tablet (150 mg total) by mouth 2 (two) times daily. What changed:    when to take this  additional instructions   REFRESH LIQUIGEL OP Place 1 drop into the left eye at bedtime.   REFRESH OP Place 1 drop into the left eye 4 (four) times daily as needed (dry eyes).   rosuvastatin 20 MG tablet Commonly known as:  CRESTOR Take 1 tablet (20 mg total) by mouth daily. What changed:  when to take this      Follow-up Information    Little, Lennette Bihari, MD. Schedule an appointment as soon as possible for a visit in 1 week(s).   Specialty:  Family Medicine Contact information: [place] [address] [phone]          Allergies  Allergen Reactions  . Tape Other (See Comments)    PATIENT IS TAKING COUMADIN; HIS SKIN TEARS & BRUISES EASILY; PLEASE USE COBAN WRAP OR AN ALTERNATIVE TO MEDICAL TAPE!!  . Lasix [Furosemide] Rash  . Penicillins Rash    Has patient had a PCN reaction causing immediate rash, facial/tongue/throat swelling, SOB or lightheadedness with hypotension: Yes Has patient had a PCN reaction causing severe rash involving mucus membranes or skin necrosis: Unknown Has patient had a PCN  reaction that required hospitalization: Unknown Has patient had a PCN reaction occurring within the last 10 years: unknown If all of the above answers are "NO", then may proceed with Cephalosporin use.   [name] Cross Reactors Rash    Consultations:   None  Other Procedures/Studies: Dg Chest Port 1 View  Result Date: 05/24/2018 CLINICAL DATA:  Cough, altered mental status. EXAM: PORTABLE CHEST 1 VIEW COMPARISON:  05/21/2018 FINDINGS: Cardiomegaly. Prior CABG and valve replacement. Stable blunting of the right costophrenic angle. Mild vascular congestion. No acute bony abnormality. IMPRESSION: Cardiomegaly with vascular congestion. Stable chronic pleural thickening and scarring at the right lung base. Electronically Signed   By: Rolm Baptise M.D.   On: 05/24/2018 09:35   Dg Chest [facility]  Result Date: 05/21/2018 CLINICAL DATA:  Lethargy, chills EXAM: PORTABLE CHEST 1 VIEW COMPARISON:  07/14/2017 FINDINGS: Chronic pleural-parenchymal scarring along the lateral right lower hemithorax. Left lung is clear. No pleural effusion or pneumothorax. Mild cardiomegaly.  Insert cabbage Median sternotomy. IMPRESSION: No evidence of acute cardiopulmonary disease. Chronic pleural-parenchymal scarring along the lateral right lower hemithorax. Electronically Signed   By: Julian Hy M.D.   On: 05/21/2018 21:48      TODAY-DAY OF DISCHARGE:  Subjective:   Brentin Shin today has no headache,no chest abdominal pain,no new weakness tingling or numbness, feels much better wants to go home today.   Objective:   Blood pressure (!) 147/106, pulse 92, temperature 98.3 F (36.8 C), temperature source Oral, resp. rate 18, height 5\' 7"  (1.702 m), weight 76.7 kg, SpO2 92 %.  Intake/Output Summary (Last 24 hours) at 05/25/2018 1026 Last data filed at 05/25/2018 0650 Gross per 24 hour  Intake 1659.61 ml  Output 3150 ml  Net -1490.39 ml   Filed Weights   05/21/18 2200 05/23/18 0411 05/25/18  0357  Weight: 74 kg 81.1 kg 76.7 kg    Exam: Awake Alert, Oriented *3, No new F.N deficits, Normal affect [place].AT,PERRAL Supple Neck,No JVD, No cervical lymphadenopathy appriciated.  Symmetrical Chest wall movement, Good air movement bilaterally, CTAB RRR,No Gallops,Rubs or new Murmurs, No Parasternal Heave +ve B.Sounds, Abd Soft, Non tender, No organomegaly appriciated, No rebound -guarding or rigidity. No Cyanosis, Clubbing or edema, No new Rash or bruise   PERTINENT RADIOLOGIC STUDIES: Dg Chest Port 1 View  Result Date: 05/24/2018 CLINICAL DATA:  Cough, altered mental status. EXAM: PORTABLE CHEST 1 VIEW COMPARISON:  05/21/2018 FINDINGS: Cardiomegaly. Prior CABG and valve replacement. Stable blunting of the right costophrenic angle. Mild vascular congestion. No acute bony abnormality. IMPRESSION: Cardiomegaly with vascular congestion. Stable chronic pleural thickening and scarring at the right lung base. Electronically Signed   By: Rolm Baptise M.D.   On: 05/24/2018 09:35   Dg Chest Port 1 View  Result Date: 05/21/2018 CLINICAL DATA:  Lethargy, chills EXAM: PORTABLE CHEST 1 VIEW COMPARISON:  07/14/2017 FINDINGS: Chronic pleural-parenchymal scarring along the lateral right lower hemithorax. Left lung is clear. No pleural effusion or pneumothorax. Mild cardiomegaly.  Insert cabbage Median sternotomy. IMPRESSION: No evidence of acute cardiopulmonary disease. Chronic pleural-parenchymal scarring along the lateral right lower hemithorax. Electronically Signed   By: Julian Hy M.D.   On: 05/21/2018 21:48     PERTINENT LAB RESULTS: CBC: Recent Labs    05/23/18 0502 05/24/18 0509  WBC 9.8 8.7  HGB 13.2 12.9*  HCT 41.7 39.7  PLT 137* 159   CMET CMP     Component Value Date/Time   NA 136 05/25/2018 0407   K 3.7 05/25/2018 0407   CL 104 05/25/2018 0407   CO2 20 (L) 05/25/2018 0407   GLUCOSE 91 05/25/2018 0407   BUN 14 05/25/2018 0407   CREATININE 1.17 05/25/2018 0407    CREATININE 1.24 (H) 11/17/2016 0958   CALCIUM 8.9 05/25/2018 0407   PROT 5.8 (L) 05/24/2018 0509   ALBUMIN 3.0 (L) 05/24/2018 0509   AST 35 05/24/2018 0509   ALT 26 05/24/2018 0509   ALKPHOS 45 05/24/2018 0509   BILITOT 1.1 05/24/2018 0509   GFRNONAA 56 (L) 05/25/2018 0407   GFRAA >60 05/25/2018 0407    GFR Estimated Creatinine Clearance: 41.6 mL/min (by C-G formula based on SCr of 1.17 mg/dL). No results for input(s): LIPASE, AMYLASE in the last 72 hours. No results for input(s):  CKTOTAL, CKMB, CKMBINDEX, TROPONINI in the last 72 hours. Invalid input(s): POCBNP No results for input(s): DDIMER in the last 72 hours. No results for input(s): HGBA1C in the last 72 hours. No results for input(s): CHOL, HDL, LDLCALC, TRIG, CHOLHDL, LDLDIRECT in the last 72 hours. Recent Labs    05/24/18 0509  TSH 1.161   No results for input(s): VITAMINB12, FOLATE, FERRITIN, TIBC, IRON, RETICCTPCT in the last 72 hours. Coags: No results for input(s): INR in the last 72 hours.  Invalid input(s): PT Microbiology: Recent Results (from the past 240 hour(s))  Blood Culture (routine x 2)     Status: None (Preliminary result)   Collection Time: 05/21/18  9:20 PM  Result Value Ref Range Status   Specimen Description BLOOD RIGHT HAND  Final   Special Requests   Final    BOTTLES DRAWN AEROBIC AND ANAEROBIC Blood Culture results may not be optimal due to an inadequate volume of blood received in culture bottles   Culture   Final    NO GROWTH 3 DAYS Performed at [facility], [place] [address], [place], [place] [address]    Report Status PENDING  Incomplete  Blood Culture (routine x 2)     Status: Abnormal   Collection Time: 05/21/18  9:40 PM  Result Value Ref Range Status   Specimen Description BLOOD RIGHT HAND  Final   Special Requests   Final    BOTTLES DRAWN AEROBIC AND ANAEROBIC Blood Culture adequate volume   Culture  Setup Time   Final    GRAM NEGATIVE RODS AEROBIC BOTTLE ONLY CRITICAL  RESULT CALLED TO, READ BACK BY AND VERIFIED WITH: Liborio Negron Torres [phone] MLM Performed at [facility], [place] [address] [place], [place] [address]    Culture ESCHERICHIA COLI (A)  Final   Report Status 05/24/2018 FINAL  Final   Organism ID, Bacteria ESCHERICHIA COLI  Final      Susceptibility   Escherichia coli - MIC*    AMPICILLIN <=2 SENSITIVE Sensitive     CEFAZOLIN <=4 SENSITIVE Sensitive     CEFEPIME <=1 SENSITIVE Sensitive     CEFTAZIDIME <=1 SENSITIVE Sensitive     CEFTRIAXONE <=1 SENSITIVE Sensitive     CIPROFLOXACIN <=0.25 SENSITIVE Sensitive     GENTAMICIN <=1 SENSITIVE Sensitive     IMIPENEM <=0.25 SENSITIVE Sensitive     TRIMETH/SULFA <=20 SENSITIVE Sensitive     AMPICILLIN/SULBACTAM <=2 SENSITIVE Sensitive     PIP/TAZO <=4 SENSITIVE Sensitive     Extended ESBL NEGATIVE Sensitive     * ESCHERICHIA COLI  Blood Culture ID Panel (Reflexed)     Status: Abnormal   Collection Time: 05/21/18  9:40 PM  Result Value Ref Range Status   Enterococcus species NOT DETECTED NOT DETECTED Final   Listeria monocytogenes NOT DETECTED NOT DETECTED Final   Staphylococcus species NOT DETECTED NOT DETECTED Final   Staphylococcus aureus (BCID) NOT DETECTED NOT DETECTED Final   Streptococcus species NOT DETECTED NOT DETECTED Final   Streptococcus agalactiae NOT DETECTED NOT DETECTED Final   Streptococcus pneumoniae NOT DETECTED NOT DETECTED Final   Streptococcus pyogenes NOT DETECTED NOT DETECTED Final   Acinetobacter baumannii NOT DETECTED NOT DETECTED Final   Enterobacteriaceae species DETECTED (A) NOT DETECTED Final    Comment: Enterobacteriaceae represent a large family of gram-negative bacteria, not a single organism. CRITICAL RESULT CALLED TO, READ BACK BY AND VERIFIED WITH: PHARMD M PHAM [phone] MLM    Enterobacter cloacae complex NOT DETECTED  NOT DETECTED Final   Escherichia coli DETECTED (A) NOT DETECTED Final    Comment: CRITICAL RESULT CALLED TO, READ BACK BY AND  VERIFIED WITH: PHARMD M PHAM [phone] MLM    Klebsiella oxytoca NOT DETECTED NOT DETECTED Final   Klebsiella pneumoniae NOT DETECTED NOT DETECTED Final   Proteus species NOT DETECTED NOT DETECTED Final   Serratia marcescens NOT DETECTED NOT DETECTED Final   Carbapenem resistance NOT DETECTED NOT DETECTED Final   Haemophilus influenzae NOT DETECTED NOT DETECTED Final   Neisseria meningitidis NOT DETECTED NOT DETECTED Final   Pseudomonas aeruginosa NOT DETECTED NOT DETECTED Final   Candida albicans NOT DETECTED NOT DETECTED Final   Candida glabrata NOT DETECTED NOT DETECTED Final   Candida krusei NOT DETECTED NOT DETECTED Final   Candida parapsilosis NOT DETECTED NOT DETECTED Final   Candida tropicalis NOT DETECTED NOT DETECTED Final    Comment: Performed at [facility], [place] [address] [place], [place] [address]  Urine Culture     Status: Abnormal   Collection Time: 05/22/18 12:48 AM  Result Value Ref Range Status   Specimen Description URINE, RANDOM  Final   Special Requests   Final    NONE Performed at [facility], [place] [address] [place], [place] [address]    Culture MULTIPLE SPECIES PRESENT, SUGGEST RECOLLECTION (A)  Final   Report Status 05/23/2018 FINAL  Final  MRSA PCR Screening     Status: None   Collection Time: 05/22/18  2:54 PM  Result Value Ref Range Status   MRSA by PCR NEGATIVE NEGATIVE Final    Comment:        The GeneXpert MRSA Assay (FDA approved for NASAL specimens only), is one component of a comprehensive MRSA colonization surveillance program. It is not intended to diagnose MRSA infection nor to guide or monitor treatment for MRSA infections. Performed at [facility], [place] [address] [place], [place] [address]     FURTHER DISCHARGE INSTRUCTIONS:  Get Medicines reviewed and adjusted: Please take all your medications with you for your next visit with your Primary MD  Laboratory/radiological data: Please request your Primary  MD to go over all hospital tests and procedure/radiological results at the follow up, please ask your Primary MD to get all Hospital records sent to his/her office.  In some cases, they will be blood work, cultures and biopsy results pending at the time of your discharge. Please request that your primary care M.D. goes through all the records of your hospital data and follows up on these results.  Also Note the following: If you experience worsening of your admission symptoms, develop shortness of breath, life threatening emergency, suicidal or homicidal thoughts you must seek medical attention immediately by calling 911 or calling your MD immediately  if symptoms less severe.  You must read complete instructions/literature along with all the possible adverse reactions/side effects for all the Medicines you take and that have been prescribed to you. Take any new Medicines after you have completely understood and accpet all the possible adverse reactions/side effects.   Do not drive when taking Pain medications or sleeping medications (Benzodaizepines)  Do not take more than prescribed Pain, Sleep and Anxiety Medications. It is not advisable to combine anxiety,sleep and pain medications without talking with your primary care practitioner  Special Instructions: If you have smoked or chewed Tobacco  in the last 2 yrs please stop smoking, stop any regular Alcohol  and or any Recreational drug use.  Wear Seat belts while driving.  Please note: You were cared for by a hospitalist during your hospital stay. Once you are discharged, your primary care physician will handle any further medical issues. Please note that NO REFILLS for any discharge medications will be authorized once you are discharged, as it is imperative that you return to your primary care physician (or establish a relationship with a primary care physician if you do not have one) for your post hospital discharge needs so that they can  reassess your need for medications and monitor your lab values.  Total Time spent coordinating discharge including counseling, education and face to face time equals  45 minutes.  SignedOren Binet 05/25/2018 10:26 AM

## 2018-05-26 DIAGNOSIS — I480 Paroxysmal atrial fibrillation: Secondary | ICD-10-CM | POA: Diagnosis not present

## 2018-05-26 DIAGNOSIS — I5022 Chronic systolic (congestive) heart failure: Secondary | ICD-10-CM | POA: Diagnosis not present

## 2018-05-26 DIAGNOSIS — I69354 Hemiplegia and hemiparesis following cerebral infarction affecting left non-dominant side: Secondary | ICD-10-CM | POA: Diagnosis not present

## 2018-05-26 DIAGNOSIS — I130 Hypertensive heart and chronic kidney disease with heart failure and stage 1 through stage 4 chronic kidney disease, or unspecified chronic kidney disease: Secondary | ICD-10-CM | POA: Diagnosis not present

## 2018-05-26 DIAGNOSIS — I2510 Atherosclerotic heart disease of native coronary artery without angina pectoris: Secondary | ICD-10-CM | POA: Diagnosis not present

## 2018-05-26 DIAGNOSIS — N183 Chronic kidney disease, stage 3 (moderate): Secondary | ICD-10-CM | POA: Diagnosis not present

## 2018-05-26 LAB — CULTURE, BLOOD (ROUTINE X 2): Culture: NO GROWTH

## 2018-05-29 NOTE — Progress Notes (Signed)
Arthur [name] Date of Birth: [birth year]   History of Present Illness: Arthur [name] is seen for followup of CAD. He has a history of coronary disease and is status post CABG in 2005. He had mitral valve repair and a Maze procedure at that time. He subsequently developed recurrent CVA and TEE demonstrated persistent left atrial appendage thrombus despite a history of previous ligation of the appendage. He has been on chronic anticoagulation.  In April 2016 he presented with a near syncopal episode. He was found to be in atrial fibrillation. Managed with rate control and anticoagulation. Converted back to NSR.    He underwent nuclear stress test in 01/03/2015 revealed a normal ejection fraction however there was a moderate sized, severe intensity, reversible inferior defect consistent with moderate inferior ischemia. Cardiac cath 01/24/15 which showed severe 3v obstructive CAD with patent LIMA-LAD, SVG-diag, SVG-OM3 and occluded SVG-PDA. He had successful stenting of prox RCA with a DES.   He was admitted on 07/11/17 with left sided weakness with right gaze preference. INR subtherapeutic at admission and CTA head neck showed calcified plaque of aorta and carotid bifurcation, right M2 superior division proximal occlusion with poor down stream collateralization and multiple areas of mild to moderate intracranial atherosclerosis. He underwent cerebral angio with complete revascularization of R-ACA with IA tPA and IV tPA. MRI/MRA brain done revealing acute moderate R-MCA, small right distal ACA infarcts, small right posterior watershed territory infarct with minimal petechial hemorrhage and old large left cerebellar infarct with successful revascularization of R-MCA occlusion. Planned to restart anticoagulation with Eliquis after 7-10 days. Discharged from inpatient Rehab on 08/03/17 to SNF.   He was admitted to the hospital from 12/8-12/12/19 with urosepsis and E. Coli bacteremia. He had AKI with creatinine up  to 1.49. this improved to 1.14 at discharge. He had Afib with RVR controlled with metoprolol. Continued on Eliquis. DC to SNF.   On follow up today he reports he is back living at home. He is in a wheelchair. He is getting therapy in the home. Denies any recurrent fever, chills, cough, SOB, Chest pain or edema. No dizziness. No bleeding.    Current Outpatient Medications on File Prior to Visit  Medication Sig Dispense Refill  . acetaminophen (TYLENOL) 650 MG CR tablet Take 650 mg by mouth daily as needed (knee pain).     Marland Kitchen apixaban (ELIQUIS) 5 MG TABS tablet Take 1 tablet (5 mg total) by mouth 2 (two) times daily. (Patient taking differently: Take 5 mg by mouth See admin instructions. Take one tablet (5 mg) by mouth twice daily - before breakfast and after supper) 180 tablet 1  . artificial tears (LACRILUBE) OINT ophthalmic ointment Place into the left eye at bedtime. (Patient not taking: Reported on 05/21/2018)    . Carboxymethylcellulose Sodium (REFRESH LIQUIGEL OP) Place 1 drop into the left eye at bedtime.    . cefdinir (OMNICEF) 300 MG capsule Take 1 capsule (300 mg total) by mouth every 12 (twelve) hours. 10 capsule 0  . diclofenac sodium (VOLTAREN) 1 % GEL Apply 2 g topically 4 (four) times daily. (Patient taking differently: Apply 2 g topically 4 (four) times daily as needed (joint pain). )    . guaiFENesin (MUCINEX) 600 MG 12 hr tablet Take 600 mg by mouth daily.    . metoprolol tartrate (LOPRESSOR) 25 MG tablet Take 1.5 tablets (37.5 mg total) by mouth 2 (two) times daily. 90 tablet 0  . montelukast (SINGULAIR) 10 MG tablet Take 1 tablet (10  mg total) by mouth at bedtime. (Patient taking differently: Take 10 mg by mouth daily after breakfast. ) 90 tablet 0  . NITROSTAT 0.4 MG SL tablet PLACE 1 TABLET (0.4 MG TOTAL) UNDER THE TONGUE EVERY 5 (FIVE) MINUTESAS NEEDED FOR CHEST PAIN (UP TO 3 DOSES). (Patient taking differently: Place 0.4 mg under the tongue every 5 (five) minutes as needed for  chest pain. ) 25 tablet 2  . pantoprazole (PROTONIX) 40 MG tablet Take 1 tablet (40 mg total) by mouth daily. (Patient taking differently: Take 40 mg by mouth daily after breakfast. ) 90 tablet 0  . phosphorus (K PHOS NEUTRAL) 155-852-130 MG tablet Take 1 tablet (250 mg total) by mouth daily. (Patient taking differently: Take 250 mg by mouth daily after breakfast. ) 90 tablet 0  . polyvinyl alcohol (LIQUIFILM TEARS) 1.4 % ophthalmic solution Place 1-2 drops into the left eye 4 (four) times daily -  with meals and at bedtime. (Patient not taking: Reported on 05/21/2018) 15 mL 0  . Polyvinyl Alcohol-Povidone (REFRESH OP) Place 1 drop into the left eye 4 (four) times daily as needed (dry eyes).    . ranitidine (ZANTAC) 150 MG tablet Take 1 tablet (150 mg total) by mouth 2 (two) times daily. (Patient taking differently: Take 150 mg by mouth See admin instructions. Take one tablet (150 mg) by mouth twice daily - before breakfast and after supper) 180 tablet 0  . rosuvastatin (CRESTOR) 20 MG tablet Take 1 tablet (20 mg total) by mouth daily. (Patient taking differently: Take 20 mg by mouth daily after breakfast. ) 90 tablet 0  . White Petrolatum-Mineral Oil (REFRESH P.M. OP) Place 1 application into the left eye at bedtime.     No current facility-administered medications on file prior to visit.     Allergies  Allergen Reactions  . Tape Other (See Comments)    PATIENT IS TAKING COUMADIN; HIS SKIN TEARS & BRUISES EASILY; PLEASE USE COBAN WRAP OR AN ALTERNATIVE TO MEDICAL TAPE!!  . Lasix [Furosemide] Rash  . Penicillins Rash    Has patient had a PCN reaction causing immediate rash, facial/tongue/throat swelling, SOB or lightheadedness with hypotension: Yes Has patient had a PCN reaction causing severe rash involving mucus membranes or skin necrosis: Unknown Has patient had a PCN reaction that required hospitalization: Unknown Has patient had a PCN reaction occurring within the last 10 years: unknown If  all of the above answers are "NO", then may proceed with Cephalosporin use.   [name] Cross Reactors Rash    Past Medical History:  Diagnosis Date  . Arthritis    "some in my joints" (01/24/2015)  . CKD (chronic kidney disease), stage II   . Coronary artery disease    a. s/p CABG in 2005 with MV repair and MAZE procedure. b. s/p DES to prox RCA 01/2015.   Marland Kitchen CVA (cerebrovascular accident) [facility]) ~ 2014   RIGHT BRAIN; denies residual on 01/24/2015  . Esophagitis    Distal esophagitis  . GERD (gastroesophageal reflux disease)   . Heart murmur   . History of hiatal hernia   . History of recurrent TIAs   . Hyperlipidemia   . Hypertension   . Hypertensive vascular disease   . MVP (mitral valve prolapse)   . Nephrolithiasis   . Odynophagia   . PAF (paroxysmal atrial fibrillation) ([place])   . Transudative pleural effusion     Past Surgical History:  Procedure Laterality Date  . APPENDECTOMY  1960's  . CARDIAC  CATHETERIZATION  03/19/2004  . CARDIAC CATHETERIZATION N/A 01/24/2015   Procedure: Left Heart Cath and Coronary Angiography;  Surgeon: Rance Smithson M Martinique, MD;  Location: [place] [facility];  Service: Cardiovascular;  Laterality: N/A;  . CARDIAC CATHETERIZATION N/A 01/24/2015   Procedure: Coronary Stent Intervention;  Surgeon: Pocahontas Cohenour M Martinique, MD;  Location: [place] [facility];  Service: Cardiovascular;  Laterality: N/A;  . CORONARY ANGIOPLASTY WITH STENT PLACEMENT  01/24/2015   "1 stent"  . CORONARY ARTERY BYPASS GRAFT  04/2004   LIMA GRAFT TO THE DISTAL LAD, SAPHENOUS VEIN GRAFT TO THE FIRST DIADGONAL BRANCH, SAPHENOUS VEIN GRAFT TO THE THIRD MARIGINAL BRANCH, AND SAPHENOUS VEIN GRAFT TO THE PDA  . IR ANGIO VERTEBRAL SEL SUBCLAVIAN INNOMINATE UNI R MOD SED  07/11/2017  . IR PERCUTANEOUS ART THROMBECTOMY/INFUSION INTRACRANIAL INC DIAG ANGIO  07/11/2017  . MAZE  04/2004  . MITRAL VALVE REPAIR  04/2004  . RADIOLOGY WITH ANESTHESIA N/A 07/11/2017   Procedure: RADIOLOGY WITH ANESTHESIA;   Surgeon: Luanne Bras, MD;  Location: [place];  Service: Radiology;  Laterality: N/A;  . TEE WITHOUT CARDIOVERSION  05/19/2011   Procedure: TRANSESOPHAGEAL ECHOCARDIOGRAM (TEE);  Surgeon: Katelynne Revak Martinique, MD;  Location: [place];  Service: Cardiovascular;  Laterality: N/A;  . TONSILLECTOMY AND ADENOIDECTOMY  1944    Social History   Tobacco Use  Smoking Status Former Smoker  . Years: 3.00  . Types: Cigarettes  . Last attempt to quit: 06/15/1955  . Years since quitting: 62.9  Smokeless Tobacco Never Used    Social History   Substance and Sexual Activity  Alcohol Use No    Family History  Problem Relation Age of Onset  . Heart attack Mother   . Stroke Father   . Hypertension Father   . Kidney failure Father     Review of Systems: As noted in history of present illness.  All other systems were reviewed and are negative.  Physical Exam: There were no vitals taken for this visit. GENERAL:  Well appearing elderly WM in NAD HEENT:  PERRL, EOMI, sclera are clear. Oropharynx is clear. NECK:  No jugular venous distention, carotid upstroke brisk and symmetric, no bruits, no thyromegaly or adenopathy LUNGS:  Clear to auscultation bilaterally CHEST:  Unremarkable HEART:  IRRR,  PMI not displaced or sustained,S1 and S2 within normal limits, no S3, no S4: no clicks, no rubs, no murmurs ABD:  Soft, nontender. BS +, no masses or bruits. No hepatomegaly, no splenomegaly EXT:  2 + pulses throughout, no edema, left leg in brace. SKIN:  Warm and dry.  No rashes NEURO:  Alert and oriented x 3. Cranial nerves II through XII intact. PSYCH:  Cognitively intact     LABORATORY DATA:   Lab Results  Component Value Date   WBC 8.7 05/24/2018   HGB 12.9 (L) 05/24/2018   HCT 39.7 05/24/2018   PLT 159 05/24/2018   GLUCOSE 91 05/25/2018   CHOL 122 07/12/2017   TRIG 92 07/12/2017   TRIG 93 07/12/2017   HDL 38 (L) 07/12/2017   LDLCALC 66 07/12/2017   ALT 26 05/24/2018   AST 35  05/24/2018   NA 136 05/25/2018   K 3.7 05/25/2018   CL 104 05/25/2018   CREATININE 1.17 05/25/2018   BUN 14 05/25/2018   CO2 20 (L) 05/25/2018   TSH 1.161 05/24/2018   INR 1.29 05/21/2018   HGBA1C 5.6 07/13/2017    Echo 07/12/17:Study Conclusions  - Left ventricle: The cavity size was normal. Systolic function  was   normal. The estimated ejection fraction was in the range of 55%   to 60%. Wall motion was normal; there were no regional wall   motion abnormalities. Features are consistent with a pseudonormal   left ventricular filling pattern, with concomitant abnormal   relaxation and increased filling pressure (grade 2 diastolic   dysfunction). - Aortic valve: Transvalvular velocity was within the normal range.   There was no stenosis. There was no regurgitation. - Aorta: Ascending aortic diameter: 38 mm (S). - Ascending aorta: The ascending aorta was mildly dilated. - Mitral valve: Prior procedures included surgical repair.   Transvalvular velocity was within the normal range. There was no   evidence for stenosis. There was mild regurgitation. Valve area   by pressure half-time: 2.29 cm^2. - Left atrium: The atrium was moderately dilated. - Right ventricle: The cavity size was normal. Wall thickness was   normal. Systolic function was normal. - Atrial septum: No defect or patent foramen ovale was identified. - Tricuspid valve: There was mild regurgitation. - Pulmonary arteries: Systolic pressure was severely increased. PA   peak pressure: 56 mm Hg (S).    Assessment / Plan: 1.  CVA recurrent embolic right MCA. Remote TEE documented a left atrial appendage thrombus despite prior ligation of the atrial appendage. He had been on chronic anticoagulation previously with coumadin. Now on Eliquis since coumadin subtherapeutic at time of CVA. Continuing Rehab therapies.   2. Coronary disease status post CABG in 2005. He also had a Maze procedure at that time.  Cardiac cath in August  2016 showed all grafts were patent except SVG to the RCA that was occluded. He underwent successful stenting of the proximal RCA with DES. He remains  asymptomatic.   3. Atrial fibrillation-status post Maze procedure. Will continue metoprolol and Eliquis. Went into Afib with RVR associated with bacteremia.  He was previously  maintaining NSR. Now still in Afib but rate well controlled on metoprolol.   4. Hypercholesterolemia. On chronic Crestor.   5. HTN. Blood pressure is well controlled. Continue Rx.  6. Recent E. Coli bacteremia with urosepsis. Resolved.   Follow up in 6 months

## 2018-05-30 ENCOUNTER — Encounter: Payer: Self-pay | Admitting: Cardiology

## 2018-05-30 ENCOUNTER — Ambulatory Visit (INDEPENDENT_AMBULATORY_CARE_PROVIDER_SITE_OTHER): Payer: Medicare Other | Admitting: Cardiology

## 2018-05-30 VITALS — BP 139/78 | HR 97

## 2018-05-30 DIAGNOSIS — E7800 Pure hypercholesterolemia, unspecified: Secondary | ICD-10-CM | POA: Diagnosis not present

## 2018-05-30 DIAGNOSIS — I639 Cerebral infarction, unspecified: Secondary | ICD-10-CM | POA: Diagnosis not present

## 2018-05-30 DIAGNOSIS — I4819 Other persistent atrial fibrillation: Secondary | ICD-10-CM

## 2018-05-30 DIAGNOSIS — I25810 Atherosclerosis of coronary artery bypass graft(s) without angina pectoris: Secondary | ICD-10-CM

## 2018-05-30 DIAGNOSIS — I10 Essential (primary) hypertension: Secondary | ICD-10-CM | POA: Diagnosis not present

## 2018-05-30 DIAGNOSIS — I341 Nonrheumatic mitral (valve) prolapse: Secondary | ICD-10-CM | POA: Diagnosis not present

## 2018-05-31 DIAGNOSIS — I480 Paroxysmal atrial fibrillation: Secondary | ICD-10-CM | POA: Diagnosis not present

## 2018-05-31 DIAGNOSIS — I5022 Chronic systolic (congestive) heart failure: Secondary | ICD-10-CM | POA: Diagnosis not present

## 2018-05-31 DIAGNOSIS — N183 Chronic kidney disease, stage 3 (moderate): Secondary | ICD-10-CM | POA: Diagnosis not present

## 2018-05-31 DIAGNOSIS — I2510 Atherosclerotic heart disease of native coronary artery without angina pectoris: Secondary | ICD-10-CM | POA: Diagnosis not present

## 2018-05-31 DIAGNOSIS — I130 Hypertensive heart and chronic kidney disease with heart failure and stage 1 through stage 4 chronic kidney disease, or unspecified chronic kidney disease: Secondary | ICD-10-CM | POA: Diagnosis not present

## 2018-05-31 DIAGNOSIS — I69354 Hemiplegia and hemiparesis following cerebral infarction affecting left non-dominant side: Secondary | ICD-10-CM | POA: Diagnosis not present

## 2018-06-03 DIAGNOSIS — I2510 Atherosclerotic heart disease of native coronary artery without angina pectoris: Secondary | ICD-10-CM | POA: Diagnosis not present

## 2018-06-03 DIAGNOSIS — I130 Hypertensive heart and chronic kidney disease with heart failure and stage 1 through stage 4 chronic kidney disease, or unspecified chronic kidney disease: Secondary | ICD-10-CM | POA: Diagnosis not present

## 2018-06-03 DIAGNOSIS — I69354 Hemiplegia and hemiparesis following cerebral infarction affecting left non-dominant side: Secondary | ICD-10-CM | POA: Diagnosis not present

## 2018-06-03 DIAGNOSIS — N183 Chronic kidney disease, stage 3 (moderate): Secondary | ICD-10-CM | POA: Diagnosis not present

## 2018-06-03 DIAGNOSIS — I5022 Chronic systolic (congestive) heart failure: Secondary | ICD-10-CM | POA: Diagnosis not present

## 2018-06-03 DIAGNOSIS — I480 Paroxysmal atrial fibrillation: Secondary | ICD-10-CM | POA: Diagnosis not present

## 2018-06-09 DIAGNOSIS — I130 Hypertensive heart and chronic kidney disease with heart failure and stage 1 through stage 4 chronic kidney disease, or unspecified chronic kidney disease: Secondary | ICD-10-CM | POA: Diagnosis not present

## 2018-06-09 DIAGNOSIS — I480 Paroxysmal atrial fibrillation: Secondary | ICD-10-CM | POA: Diagnosis not present

## 2018-06-09 DIAGNOSIS — I2510 Atherosclerotic heart disease of native coronary artery without angina pectoris: Secondary | ICD-10-CM | POA: Diagnosis not present

## 2018-06-09 DIAGNOSIS — N183 Chronic kidney disease, stage 3 (moderate): Secondary | ICD-10-CM | POA: Diagnosis not present

## 2018-06-09 DIAGNOSIS — I69354 Hemiplegia and hemiparesis following cerebral infarction affecting left non-dominant side: Secondary | ICD-10-CM | POA: Diagnosis not present

## 2018-06-09 DIAGNOSIS — I5022 Chronic systolic (congestive) heart failure: Secondary | ICD-10-CM | POA: Diagnosis not present

## 2018-06-13 DIAGNOSIS — I480 Paroxysmal atrial fibrillation: Secondary | ICD-10-CM | POA: Diagnosis not present

## 2018-06-13 DIAGNOSIS — I2510 Atherosclerotic heart disease of native coronary artery without angina pectoris: Secondary | ICD-10-CM | POA: Diagnosis not present

## 2018-06-13 DIAGNOSIS — I130 Hypertensive heart and chronic kidney disease with heart failure and stage 1 through stage 4 chronic kidney disease, or unspecified chronic kidney disease: Secondary | ICD-10-CM | POA: Diagnosis not present

## 2018-06-13 DIAGNOSIS — I5022 Chronic systolic (congestive) heart failure: Secondary | ICD-10-CM | POA: Diagnosis not present

## 2018-06-13 DIAGNOSIS — N183 Chronic kidney disease, stage 3 (moderate): Secondary | ICD-10-CM | POA: Diagnosis not present

## 2018-06-13 DIAGNOSIS — I69354 Hemiplegia and hemiparesis following cerebral infarction affecting left non-dominant side: Secondary | ICD-10-CM | POA: Diagnosis not present

## 2018-06-15 DIAGNOSIS — I2510 Atherosclerotic heart disease of native coronary artery without angina pectoris: Secondary | ICD-10-CM | POA: Diagnosis not present

## 2018-06-15 DIAGNOSIS — I480 Paroxysmal atrial fibrillation: Secondary | ICD-10-CM | POA: Diagnosis not present

## 2018-06-15 DIAGNOSIS — I130 Hypertensive heart and chronic kidney disease with heart failure and stage 1 through stage 4 chronic kidney disease, or unspecified chronic kidney disease: Secondary | ICD-10-CM | POA: Diagnosis not present

## 2018-06-15 DIAGNOSIS — I69354 Hemiplegia and hemiparesis following cerebral infarction affecting left non-dominant side: Secondary | ICD-10-CM | POA: Diagnosis not present

## 2018-06-15 DIAGNOSIS — N183 Chronic kidney disease, stage 3 (moderate): Secondary | ICD-10-CM | POA: Diagnosis not present

## 2018-06-15 DIAGNOSIS — I5022 Chronic systolic (congestive) heart failure: Secondary | ICD-10-CM | POA: Diagnosis not present

## 2018-06-16 ENCOUNTER — Telehealth: Payer: Self-pay | Admitting: Cardiology

## 2018-06-16 DIAGNOSIS — I5022 Chronic systolic (congestive) heart failure: Secondary | ICD-10-CM | POA: Diagnosis not present

## 2018-06-16 DIAGNOSIS — I69354 Hemiplegia and hemiparesis following cerebral infarction affecting left non-dominant side: Secondary | ICD-10-CM | POA: Diagnosis not present

## 2018-06-16 DIAGNOSIS — I2510 Atherosclerotic heart disease of native coronary artery without angina pectoris: Secondary | ICD-10-CM | POA: Diagnosis not present

## 2018-06-16 DIAGNOSIS — I130 Hypertensive heart and chronic kidney disease with heart failure and stage 1 through stage 4 chronic kidney disease, or unspecified chronic kidney disease: Secondary | ICD-10-CM | POA: Diagnosis not present

## 2018-06-16 DIAGNOSIS — I480 Paroxysmal atrial fibrillation: Secondary | ICD-10-CM | POA: Diagnosis not present

## 2018-06-16 DIAGNOSIS — N183 Chronic kidney disease, stage 3 (moderate): Secondary | ICD-10-CM | POA: Diagnosis not present

## 2018-06-16 NOTE — Telephone Encounter (Signed)
Spoke with Arthur [name], aware the urine order did not come from [place].

## 2018-06-16 NOTE — Telephone Encounter (Signed)
New message   Home health nurse has an order to collect urine sample today but was unable to retreive it. Needs verbal order to collect on Jan 6th

## 2018-06-19 DIAGNOSIS — I69354 Hemiplegia and hemiparesis following cerebral infarction affecting left non-dominant side: Secondary | ICD-10-CM | POA: Diagnosis not present

## 2018-06-19 DIAGNOSIS — I480 Paroxysmal atrial fibrillation: Secondary | ICD-10-CM | POA: Diagnosis not present

## 2018-06-19 DIAGNOSIS — N183 Chronic kidney disease, stage 3 (moderate): Secondary | ICD-10-CM | POA: Diagnosis not present

## 2018-06-19 DIAGNOSIS — I2510 Atherosclerotic heart disease of native coronary artery without angina pectoris: Secondary | ICD-10-CM | POA: Diagnosis not present

## 2018-06-19 DIAGNOSIS — I130 Hypertensive heart and chronic kidney disease with heart failure and stage 1 through stage 4 chronic kidney disease, or unspecified chronic kidney disease: Secondary | ICD-10-CM | POA: Diagnosis not present

## 2018-06-19 DIAGNOSIS — I5022 Chronic systolic (congestive) heart failure: Secondary | ICD-10-CM | POA: Diagnosis not present

## 2018-06-20 DIAGNOSIS — I480 Paroxysmal atrial fibrillation: Secondary | ICD-10-CM | POA: Diagnosis not present

## 2018-06-20 DIAGNOSIS — I130 Hypertensive heart and chronic kidney disease with heart failure and stage 1 through stage 4 chronic kidney disease, or unspecified chronic kidney disease: Secondary | ICD-10-CM | POA: Diagnosis not present

## 2018-06-20 DIAGNOSIS — I5022 Chronic systolic (congestive) heart failure: Secondary | ICD-10-CM | POA: Diagnosis not present

## 2018-06-20 DIAGNOSIS — I2510 Atherosclerotic heart disease of native coronary artery without angina pectoris: Secondary | ICD-10-CM | POA: Diagnosis not present

## 2018-06-20 DIAGNOSIS — N183 Chronic kidney disease, stage 3 (moderate): Secondary | ICD-10-CM | POA: Diagnosis not present

## 2018-06-20 DIAGNOSIS — I69354 Hemiplegia and hemiparesis following cerebral infarction affecting left non-dominant side: Secondary | ICD-10-CM | POA: Diagnosis not present

## 2018-06-22 ENCOUNTER — Telehealth: Payer: Self-pay | Admitting: Cardiology

## 2018-06-22 MED ORDER — METOPROLOL TARTRATE 25 MG PO TABS: 37.5000 mg | ORAL_TABLET | Freq: Two times a day (BID) | ORAL | 1 refills | Status: DC

## 2018-06-22 NOTE — Telephone Encounter (Addendum)
Called patient, wife answered and ask that Metoprolol be called to [place]. Confirmed dose and that patient is not taking Carvedilol . Tried to call Hedwig Morton D and he was at lunch. Advised tech would just send electronically.

## 2018-06-22 NOTE — Telephone Encounter (Signed)
New message   Pt c/o medication issue:  1. Name of Medication: metoprolol  2. How are you currently taking this medication (dosage and times per day)? n/a  3. Are you having a reaction (difficulty breathing--STAT)?n/a  4. What is your medication issue? Pharmacy states that he does not have a prescription for metoprolol. Patient states that he uses to be carvedilol. Pharmacy needs a prescription for metoprolol.

## 2018-07-03 DIAGNOSIS — L603 Nail dystrophy: Secondary | ICD-10-CM | POA: Diagnosis not present

## 2018-07-03 DIAGNOSIS — B351 Tinea unguium: Secondary | ICD-10-CM | POA: Diagnosis not present

## 2018-07-03 DIAGNOSIS — L600 Ingrowing nail: Secondary | ICD-10-CM | POA: Diagnosis not present

## 2018-07-03 DIAGNOSIS — I739 Peripheral vascular disease, unspecified: Secondary | ICD-10-CM | POA: Diagnosis not present

## 2018-07-03 DIAGNOSIS — M79671 Pain in right foot: Secondary | ICD-10-CM | POA: Diagnosis not present

## 2018-07-03 DIAGNOSIS — M79672 Pain in left foot: Secondary | ICD-10-CM | POA: Diagnosis not present

## 2018-07-18 ENCOUNTER — Telehealth: Payer: Self-pay

## 2018-07-18 NOTE — Telephone Encounter (Signed)
Spoke to patient's wife handicap parking form completed.Form placed in mail.

## 2018-07-28 ENCOUNTER — Telehealth: Payer: Self-pay | Admitting: Cardiology

## 2018-07-28 NOTE — Telephone Encounter (Signed)
New Message   PTs wife is calling because she has questions she would like to ask the nurse Please call

## 2018-07-28 NOTE — Telephone Encounter (Signed)
Returned pt wife call. Mrs.Melgarejo called to give an update to [place]. Adv her that Dr.Joprdan is working in the cath lab today. The pt has a history of bad dreams that started after the pt Jan 2019 CVA. Recently the pt has begun seeing things that are not there. She is unsure if this could be signs of dementia. The pt denies stroke like symptoms. The pt does not have any cardiac symptoms to report. The pt has seen Dr.Sethi after his Jan 2019 stroke. Adv Mrs.Korman to f/u with Dr.Sethi's office or the pt pcp Dr.Little about her concerns.  She declined and would like Dr.Jordan to give his recommendation. She is most comfortable and trust Dr.Jordan. She wonders if Dr.Jordan thinks the patient's medications could be causing the hallucinations, and does he need to see Mr.Asante.  Adv pt wife that I will fed the update to Dr.Jordan and we will call back with his recommendation. She voiced appreciation for the assistance.

## 2018-07-30 NOTE — Telephone Encounter (Signed)
I don't see any of his medication causing hallucinations. This may be related to prior CVA. I would recommend he see Neurology for evaluation of these symptoms  Peter Martinique MD, [facility]

## 2018-07-31 NOTE — Telephone Encounter (Signed)
Returned call to patient's wife Dr.Jordan's recommendation given.Stated husband has slept better the past 2 nights.She will call neurologist and schedule appointment.

## 2018-08-04 ENCOUNTER — Other Ambulatory Visit: Payer: Self-pay | Admitting: Cardiology

## 2018-08-11 DIAGNOSIS — L820 Inflamed seborrheic keratosis: Secondary | ICD-10-CM | POA: Diagnosis not present

## 2018-08-11 DIAGNOSIS — L570 Actinic keratosis: Secondary | ICD-10-CM | POA: Diagnosis not present

## 2018-08-11 DIAGNOSIS — L304 Erythema intertrigo: Secondary | ICD-10-CM | POA: Diagnosis not present

## 2018-08-11 DIAGNOSIS — L308 Other specified dermatitis: Secondary | ICD-10-CM | POA: Diagnosis not present

## 2018-08-11 DIAGNOSIS — X32XXXD Exposure to sunlight, subsequent encounter: Secondary | ICD-10-CM | POA: Diagnosis not present

## 2018-08-14 ENCOUNTER — Emergency Department (HOSPITAL_COMMUNITY)
Admission: EM | Admit: 2018-08-14 | Discharge: 2018-08-15 | Disposition: A | Discharge status: Home / Self Care | Payer: Medicare Other | Attending: Emergency Medicine | Admitting: Emergency Medicine

## 2018-08-14 ENCOUNTER — Other Ambulatory Visit: Payer: Self-pay

## 2018-08-14 ENCOUNTER — Telehealth: Payer: Self-pay | Admitting: Neurology

## 2018-08-14 ENCOUNTER — Encounter (HOSPITAL_COMMUNITY): Payer: Self-pay

## 2018-08-14 DIAGNOSIS — Z951 Presence of aortocoronary bypass graft: Secondary | ICD-10-CM | POA: Diagnosis not present

## 2018-08-14 DIAGNOSIS — M25552 Pain in left hip: Secondary | ICD-10-CM

## 2018-08-14 DIAGNOSIS — N182 Chronic kidney disease, stage 2 (mild): Secondary | ICD-10-CM | POA: Insufficient documentation

## 2018-08-14 DIAGNOSIS — I4891 Unspecified atrial fibrillation: Secondary | ICD-10-CM | POA: Diagnosis not present

## 2018-08-14 DIAGNOSIS — I129 Hypertensive chronic kidney disease with stage 1 through stage 4 chronic kidney disease, or unspecified chronic kidney disease: Secondary | ICD-10-CM | POA: Diagnosis not present

## 2018-08-14 DIAGNOSIS — I10 Essential (primary) hypertension: Secondary | ICD-10-CM | POA: Diagnosis not present

## 2018-08-14 DIAGNOSIS — Z8673 Personal history of transient ischemic attack (TIA), and cerebral infarction without residual deficits: Secondary | ICD-10-CM | POA: Insufficient documentation

## 2018-08-14 DIAGNOSIS — Z79899 Other long term (current) drug therapy: Secondary | ICD-10-CM | POA: Insufficient documentation

## 2018-08-14 DIAGNOSIS — N179 Acute kidney failure, unspecified: Secondary | ICD-10-CM

## 2018-08-14 DIAGNOSIS — Z87891 Personal history of nicotine dependence: Secondary | ICD-10-CM | POA: Diagnosis not present

## 2018-08-14 DIAGNOSIS — R4781 Slurred speech: Secondary | ICD-10-CM | POA: Diagnosis not present

## 2018-08-14 DIAGNOSIS — R531 Weakness: Secondary | ICD-10-CM | POA: Insufficient documentation

## 2018-08-14 DIAGNOSIS — Z7901 Long term (current) use of anticoagulants: Secondary | ICD-10-CM | POA: Diagnosis not present

## 2018-08-14 DIAGNOSIS — M25559 Pain in unspecified hip: Secondary | ICD-10-CM | POA: Diagnosis not present

## 2018-08-14 DIAGNOSIS — R52 Pain, unspecified: Secondary | ICD-10-CM | POA: Diagnosis not present

## 2018-08-14 NOTE — Telephone Encounter (Signed)
Pt wife(Nahar,Nancy)on DPR has called to inform of a concern she has about pt having a small stroke or TIA.  Wife was encouraged to take pt to ED, she declined stating pt memory is in tact and does not feel the need.  Wife stated she would like pt to see Dr Leonie Man, wife was told of Dr Clydene Fake 1st available being in April and wants to know if pt could be checked out by NP re: pain in shoulders, joints and groin.  Please call

## 2018-08-14 NOTE — Telephone Encounter (Addendum)
I called patients wife back regarding her concerns. She was trying to not talk loud because she did not want her husband to know she had call. She stated last week her husband was trying to find his words and could not get them out. Her son in law is a therapist and he thinks pt might of had a stroke. Also she reported he is having pain in his shoulders, joints and groin. I advise her we dont treat pain issues and his PCP can evaluate his shoulders, joints and groin area. The wife verbalized understanding the need to see PCP of her husband pain issues. She stated her husband memory is doing well bedside that once episode. I stated to wife that the ED should evaluate her husband if she thinks he had a TIA.or a stroke.I also offer to make an appt with Dr. Leonie Man at next available. THe wife stated she will call the PCP about the groin, shoulder, and joint pain. THe wife will call us back about to make an appt with Dr.SEthi. My recommendation was the ED for pt.

## 2018-08-14 NOTE — Telephone Encounter (Signed)
Revised. 

## 2018-08-14 NOTE — ED Triage Notes (Signed)
Pt here for left hip pain and spasms. History of CVA with left sided weakness deficits since then.  HTN for EMS.  VAN neg no new neuro deficits.

## 2018-08-15 ENCOUNTER — Emergency Department (HOSPITAL_COMMUNITY): Payer: Medicare Other

## 2018-08-15 DIAGNOSIS — M25552 Pain in left hip: Secondary | ICD-10-CM | POA: Diagnosis not present

## 2018-08-15 DIAGNOSIS — R531 Weakness: Secondary | ICD-10-CM | POA: Diagnosis not present

## 2018-08-15 LAB — CBC WITH DIFFERENTIAL/PLATELET
Abs Immature Granulocytes: 0.05 10*3/uL (ref 0.00–0.07)
Basophils Absolute: 0.1 10*3/uL (ref 0.0–0.1)
Basophils Relative: 0 %
Eosinophils Absolute: 0 10*3/uL (ref 0.0–0.5)
Eosinophils Relative: 0 %
HCT: 49.3 % (ref 39.0–52.0)
Hemoglobin: 15.6 g/dL (ref 13.0–17.0)
Immature Granulocytes: 0 %
LYMPHS PCT: 6 %
Lymphs Abs: 0.8 10*3/uL (ref 0.7–4.0)
MCH: 28.7 pg (ref 26.0–34.0)
MCHC: 31.6 g/dL (ref 30.0–36.0)
MCV: 90.8 fL (ref 80.0–100.0)
Monocytes Absolute: 0.6 10*3/uL (ref 0.1–1.0)
Monocytes Relative: 4 %
Neutro Abs: 12 10*3/uL — ABNORMAL HIGH (ref 1.7–7.7)
Neutrophils Relative %: 90 %
Platelets: 232 10*3/uL (ref 150–400)
RBC: 5.43 MIL/uL (ref 4.22–5.81)
RDW: 14 % (ref 11.5–15.5)
WBC: 13.4 10*3/uL — AB (ref 4.0–10.5)
nRBC: 0 % (ref 0.0–0.2)

## 2018-08-15 LAB — URINALYSIS, ROUTINE W REFLEX MICROSCOPIC
Bilirubin Urine: NEGATIVE
Glucose, UA: NEGATIVE mg/dL
Hgb urine dipstick: NEGATIVE
Ketones, ur: NEGATIVE mg/dL
Leukocytes,Ua: NEGATIVE
Nitrite: NEGATIVE
Protein, ur: 30 mg/dL — AB
Specific Gravity, Urine: 1.017 (ref 1.005–1.030)
pH: 5 (ref 5.0–8.0)

## 2018-08-15 LAB — BASIC METABOLIC PANEL
Anion gap: 10 (ref 5–15)
BUN: 24 mg/dL — ABNORMAL HIGH (ref 8–23)
CO2: 23 mmol/L (ref 22–32)
Calcium: 9.9 mg/dL (ref 8.9–10.3)
Chloride: 106 mmol/L (ref 98–111)
Creatinine, Ser: 1.68 mg/dL — ABNORMAL HIGH (ref 0.61–1.24)
GFR calc Af Amer: 42 mL/min — ABNORMAL LOW (ref >60)
GFR calc non Af Amer: 36 mL/min — ABNORMAL LOW (ref >60)
Glucose, Bld: 128 mg/dL — ABNORMAL HIGH (ref 70–99)
Potassium: 3.8 mmol/L (ref 3.5–5.1)
SODIUM: 139 mmol/L (ref 135–145)

## 2018-08-15 MED ORDER — HYDROCODONE-ACETAMINOPHEN 5-325 MG PO TABS: 1.0000 | ORAL_TABLET | Freq: Four times a day (QID) | ORAL | 0 refills | Status: DC | PRN

## 2018-08-15 MED ORDER — FENTANYL CITRATE (PF) 100 MCG/2ML IJ SOLN
50.0000 ug | Freq: Once | INTRAMUSCULAR | Status: AC
  Administered 2018-08-15: 50 ug via INTRAVENOUS
  Filled 2018-08-15: qty 2

## 2018-08-15 MED ORDER — SODIUM CHLORIDE 0.9 % IV BOLUS (SEPSIS)
500.0000 mL | Freq: Once | INTRAVENOUS | Status: AC
  Administered 2018-08-15: 500 mL via INTRAVENOUS

## 2018-08-15 MED ORDER — ONDANSETRON HCL 4 MG/2ML IJ SOLN
4.0000 mg | Freq: Once | INTRAMUSCULAR | Status: AC
  Administered 2018-08-15: 4 mg via INTRAVENOUS
  Filled 2018-08-15: qty 2

## 2018-08-15 NOTE — ED Notes (Signed)
Sent urine culture with UA. 

## 2018-08-15 NOTE — Discharge Instructions (Addendum)
Your kidney function was slightly elevated today compared to previous.  We have given you IV fluids and have recommended close follow-up with your primary care physician to follow this.  You also had a slightly elevated white blood cell count which can be from pain or infection.  No signs of infection present today.  X-ray of the hip showed degenerative changes, arthritis but no fracture.  Head CT showed no new stroke or other acute abnormality.  I recommend that you avoid ibuprofen, aspirin, Aleve for pain.  I do not recommend that you fill the Tylenol 3 that you were prescribed urgent care today.  You may take tizanidine as needed as a muscle relaxer.  This medication may make you very drowsy so recommend caution with taking it.  We also discharging with prescription of Vicodin to take as needed for pain.  Please do not take tizanidine and Vicodin at the same time.  Vicodin can make you constipated as it is a narcotic pain medication.  You may use MiraLAX and Colace over-the-counter to help with constipation.

## 2018-08-15 NOTE — ED Notes (Signed)
Patient transported to CT 

## 2018-08-15 NOTE — ED Notes (Signed)
Patient verbalizes understanding of discharge instructions. Opportunity for questioning and answers were provided. Armband removed by staff, pt discharged from ED ambulatory.   

## 2018-08-15 NOTE — ED Provider Notes (Signed)
TIME SEEN: 3:19 AM  CHIEF COMPLAINT: Left-sided weakness and muscle spasm  HPI: Patient is an 85 year old male with history of CAD status post CABG, CVA with left-sided hemiparesis, hypertension, hyperlipidemia, chronic kidney disease, atrial fibrillation on Eliquis who presents to the emergency department with his wife and grandson in law who is a physical therapist.  They report over the past several weeks he has had increasing pain in the left hip and increasing contractures of the left upper and lower extremity.  They have not seen their primary care doctor Dr. Rex Kras for this.  State that patient is normally bedbound or wheelchair-bound but is able to transfer on his own but this has become more difficult since pain is increasing.  Went to walk-in clinic today and was prescribed tizanidine and Tylenol 3.  At the walk-in clinic his blood pressure was in the 200s/110s and he was sent to the ER for hypertensive urgency.  He denies any chest pain or shortness of breath.  No headache.  No vision changes.  No new numbness.  He does not feel that there is any increased weakness in the left side.  Family reports they feel like his speech has been slurred intermittently but not currently.  He denies any headache.  No injury to the left hip.  ROS: See HPI Constitutional: no fever  Eyes: no drainage  ENT: no runny nose   Cardiovascular:  no chest pain  Resp: no SOB  GI: no vomiting GU: no dysuria Integumentary: no rash  Allergy: no hives  Musculoskeletal: no leg swelling  Neurological: no slurred speech ROS otherwise negative  PAST MEDICAL HISTORY/PAST SURGICAL HISTORY:  Past Medical History:  Diagnosis Date  . Arthritis    "some in my joints" (01/24/2015)  . CKD (chronic kidney disease), stage II   . Coronary artery disease    a. s/p CABG in 2005 with MV repair and MAZE procedure. b. s/p DES to prox RCA 01/2015.   Marland Kitchen CVA (cerebrovascular accident) [facility]) ~ 2014   RIGHT BRAIN; denies residual on  01/24/2015  . Esophagitis    Distal esophagitis  . GERD (gastroesophageal reflux disease)   . Heart murmur   . History of hiatal hernia   . History of recurrent TIAs   . Hyperlipidemia   . Hypertension   . Hypertensive vascular disease   . MVP (mitral valve prolapse)   . Nephrolithiasis   . Odynophagia   . PAF (paroxysmal atrial fibrillation) ([place])   . Transudative pleural effusion     MEDICATIONS:  Prior to Admission medications   Medication Sig Start Date End Date Taking? Authorizing Provider  acetaminophen (TYLENOL) 650 MG CR tablet Take 650 mg by mouth daily as needed (knee pain).     [provider]  apixaban (ELIQUIS) 5 MG TABS tablet Take 1 tablet (5 mg total) by mouth 2 (two) times daily. Patient taking differently: Take 5 mg by mouth See admin instructions. Take one tablet (5 mg) by mouth twice daily - before breakfast and after supper 05/03/18   Martinique, Peter M, MD  artificial tears (LACRILUBE) OINT ophthalmic ointment Place into the left eye at bedtime. 08/03/17   Love, Ivan Anchors, PA-C  Carboxymethylcellulose Sodium (REFRESH LIQUIGEL OP) Place 1 drop into the left eye at bedtime.    [provider]  cefdinir (OMNICEF) 300 MG capsule Take 1 capsule (300 mg total) by mouth every 12 (twelve) hours. 05/25/18   Ghimire, Henreitta Leber, MD  diclofenac sodium (VOLTAREN) 1 % GEL  Apply 2 g topically 4 (four) times daily. Patient taking differently: Apply 2 g topically 4 (four) times daily as needed (joint pain).  08/03/17   Love, Ivan Anchors, PA-C  guaiFENesin (MUCINEX) 600 MG 12 hr tablet Take 600 mg by mouth daily.    [provider]  K Phos Mono-Sod Phos Di & Mono (PHOSPHA 250 NEUTRAL) [phone] MG TABS Take 1 tablet (250 mg total) by mouth daily after breakfast. 08/07/18   Martinique, Peter M, MD  metoprolol tartrate (LOPRESSOR) 25 MG tablet Take 1.5 tablets (37.5 mg total) by mouth 2 (two) times daily. 06/22/18   Martinique, Peter M, MD  montelukast (SINGULAIR) 10 MG  tablet Take 1 tablet (10 mg total) by mouth daily after breakfast. 08/07/18   Martinique, Peter M, MD  NITROSTAT 0.4 MG SL tablet PLACE 1 TABLET (0.4 MG TOTAL) UNDER THE TONGUE EVERY 5 (FIVE) MINUTESAS NEEDED FOR CHEST PAIN (UP TO 3 DOSES). Patient taking differently: Place 0.4 mg under the tongue every 5 (five) minutes as needed for chest pain.  12/13/16   Martinique, Peter M, MD  pantoprazole (PROTONIX) 40 MG tablet Take 1 tablet (40 mg total) by mouth daily. 08/07/18   Martinique, Peter M, MD  polyvinyl alcohol (LIQUIFILM TEARS) 1.4 % ophthalmic solution Place 1-2 drops into the left eye 4 (four) times daily -  with meals and at bedtime. 08/03/17   Love, Ivan Anchors, PA-C  Polyvinyl Alcohol-Povidone (REFRESH OP) Place 1 drop into the left eye 4 (four) times daily as needed (dry eyes).    [provider]  ranitidine (ZANTAC) 150 MG tablet Take 1 tablet (150 mg total) by mouth 2 (two) times daily before a meal. 08/07/18   Martinique, Peter M, MD  rosuvastatin (CRESTOR) 20 MG tablet Take 1 tablet (20 mg total) by mouth daily. 08/07/18   Martinique, Peter M, MD  White Petrolatum-Mineral Oil (REFRESH P.M. OP) Place 1 application into the left eye at bedtime.    [provider]    ALLERGIES:  Allergies  Allergen Reactions  . Tape Other (See Comments)    PATIENT IS TAKING COUMADIN; HIS SKIN TEARS & BRUISES EASILY; PLEASE USE COBAN WRAP OR AN ALTERNATIVE TO MEDICAL TAPE!!  . Lasix [Furosemide] Rash  . Penicillins Rash    Has patient had a PCN reaction causing immediate rash, facial/tongue/throat swelling, SOB or lightheadedness with hypotension: Yes Has patient had a PCN reaction causing severe rash involving mucus membranes or skin necrosis: Unknown Has patient had a PCN reaction that required hospitalization: Unknown Has patient had a PCN reaction occurring within the last 10 years: unknown If all of the above answers are "NO", then may proceed with Cephalosporin use.   [name] Cross Reactors Rash     SOCIAL HISTORY:  Social History   Tobacco Use  . Smoking status: Former Smoker    Years: 3.00    Types: Cigarettes    Last attempt to quit: 06/15/1955    Years since quitting: 63.2  . Smokeless tobacco: Never Used  Substance Use Topics  . Alcohol use: No    FAMILY HISTORY: Family History  Problem Relation Age of Onset  . Heart attack Mother   . Stroke Father   . Hypertension Father   . Kidney failure Father     EXAM: BP (!) 117/43   Pulse (!) 107   Temp 97.7 F (36.5 C) (Oral)   Resp 18   SpO2 97%  CONSTITUTIONAL: Alert and oriented and responds appropriately to  questions.  Elderly, chronically ill-appearing HEAD: Normocephalic EYES: Conjunctivae clear, pupils appear equal, EOMI ENT: normal nose; moist mucous membranes NECK: Supple, no meningismus, no nuchal rigidity, no LAD  CARD: Irregularly irregular and intermittently mildly tachycardic; S1 and S2 appreciated; no murmurs, no clicks, no rubs, no gallops RESP: Normal chest excursion without splinting or tachypnea; breath sounds clear and equal bilaterally; no wheezes, no rhonchi, no rales, no hypoxia or respiratory distress, speaking full sentences ABD/GI: Normal bowel sounds; non-distended; soft, non-tender, no rebound, no guarding, no peritoneal signs, no hepatosplenomegaly BACK:  The back appears normal and is non-tender to palpation, there is no CVA tenderness EXT: Hemiplegia of the left side which is chronic.  Tender to palpation over the lateral left hip without redness, warmth, swelling.  No other bony tenderness throughout the left leg.  Left arm is nontender to palpation.  Compartments are soft.  Extremities are warm and well-perfused with 2+ radial and DP pulses bilaterally. SKIN: Normal color for age and race; warm; no rash NEURO: Hemiaplegia of the left upper and lower extremity with contractures.  He reports normal sensation diffusely.  Normal movement in the right upper and lower extremity.  Cranial nerves  II through XII intact.  No dysarthria or aphasia noted.   PSYCH: The patient's mood and manner are appropriate. Grooming and personal hygiene are appropriate.  MEDICAL DECISION MAKING: Patient here with increasing pain secondary to contractures of his left upper and lower extremity.  Family concerned that he could have a new stroke although I feel this is less likely.  Will repeat head CT given he is on anticoagulation to ensure there is no intracranial hemorrhage.  He does not appear to have any new focal neurologic deficits today.  Discussed with family that this could be exacerbation of old symptoms if he had other medical issues present today such as UTI, dehydration, anemia.  Will check labs, urine.  EKG shows atrial fibrillation which is chronic without ischemic abnormality.  Will obtain x-ray of the left hip to evaluate for pathologic fracture and give fentanyl for pain.  If work-up here is unremarkable and pain is well controlled, family felt comfortable taking patient home and will follow up with Dr. Rex Kras.  ED PROGRESS: CT head shows no acute abnormality.  X-ray of the hip shows degenerative changes but no acute fracture or dislocation.  He feels his pain is improved.  Labs show leukocytosis of 13,000 with left shift but he denies fever or infectious symptoms.  Has history of chronic kidney disease but creatinine mildly elevated from baseline.  Will give IV hydration.  Urine shows no sign of infection today.  Family comfortable with plan for discharge home.  Have advised against Tylenol 3 given concerns for black box warnings.  They will fill tizanidine and will discharge with prescription of Vicodin to use as needed.  Recommended close follow-up with Dr. Rex Kras as an outpatient.   At this time, I do not feel there is any life-threatening condition present. I have reviewed and discussed all results (EKG, imaging, lab, urine as appropriate) and exam findings with patient/family. I have reviewed  nursing notes and appropriate previous records.  I feel the patient is safe to be discharged home without further emergent workup and can continue workup as an outpatient as needed. Discussed usual and customary return precautions. Patient/family verbalize understanding and are comfortable with this plan.  Outpatient follow-up has been provided as needed. All questions have been answered.     EKG Interpretation  Date/Time:  Tuesday August 15 2018 04:29:44 EST Ventricular Rate:  120 PR Interval:    QRS Duration: 83 QT Interval:  335 QTC Calculation: 474 R Axis:   -115 Text Interpretation:  Atrial fibrillation Left anterior fascicular block Anterior infarct, old Minimal ST depression, diffuse leads No significant change since last tracing Confirmed by Ward, Cyril Mourning [phone]) on 08/15/2018 4:32:16 AM          Ward, Delice Bison, DO 08/15/18 [id number]

## 2018-08-18 DIAGNOSIS — Z8673 Personal history of transient ischemic attack (TIA), and cerebral infarction without residual deficits: Secondary | ICD-10-CM | POA: Diagnosis not present

## 2018-08-18 DIAGNOSIS — I4891 Unspecified atrial fibrillation: Secondary | ICD-10-CM | POA: Diagnosis not present

## 2018-08-18 DIAGNOSIS — G8114 Spastic hemiplegia affecting left nondominant side: Secondary | ICD-10-CM | POA: Diagnosis not present

## 2018-08-18 DIAGNOSIS — M25552 Pain in left hip: Secondary | ICD-10-CM | POA: Diagnosis not present

## 2018-09-05 DIAGNOSIS — R82998 Other abnormal findings in urine: Secondary | ICD-10-CM | POA: Diagnosis not present

## 2018-10-02 DIAGNOSIS — M25552 Pain in left hip: Secondary | ICD-10-CM | POA: Diagnosis not present

## 2018-10-02 DIAGNOSIS — G8114 Spastic hemiplegia affecting left nondominant side: Secondary | ICD-10-CM | POA: Diagnosis not present

## 2018-11-01 ENCOUNTER — Telehealth: Payer: Self-pay | Admitting: Cardiology

## 2018-11-01 DIAGNOSIS — I4819 Other persistent atrial fibrillation: Secondary | ICD-10-CM

## 2018-11-02 NOTE — Telephone Encounter (Signed)
SCr elevated at 1.68 in hospital.  Previously was 1.17.  Has not been checked since then in [place], epic or [place].  Will refill x 1 for 30 days, has appt scheduled with Dr. Martinique in a few weeks, will ask for BMET

## 2018-11-02 NOTE — Telephone Encounter (Signed)
Pt is an 48 yom wt of 73.5kg(11/18/17), scr of 1.68(08/15/18), lov w/Dr. Jordan(05/30/18) requesting 5mg  eliquis bur only clears for 2.5 mg will make pharmd aware

## 2018-11-07 ENCOUNTER — Telehealth: Payer: Self-pay | Admitting: Cardiology

## 2018-11-07 NOTE — Telephone Encounter (Signed)
New message   Pt c/o medication issue:  1. Name of Medication: ranitidine  2. How are you currently taking this medication (dosage and times per day)? Twice daily  3. Are you having a reaction (difficulty breathing--STAT)? No   4. What is your medication issue? Patient's wife would like to know if he can take another medicine comparable to this medication. Please advise.

## 2018-11-07 NOTE — Telephone Encounter (Signed)
Pt wife advised to talk with Dr. Rex Kras office re: an alternative to the Zantac. She agreed and will try them this afternoon.

## 2018-11-08 ENCOUNTER — Other Ambulatory Visit: Payer: Self-pay

## 2018-11-08 NOTE — Telephone Encounter (Signed)
Refill

## 2018-11-10 NOTE — Telephone Encounter (Signed)
Almost all of the H2 blockers are backordered or not available.  Have him take the protonix bid instead of qd

## 2018-11-10 NOTE — Telephone Encounter (Signed)
Called and spoke with wife- she states that they wanted to come off Ranitidine as [employer] told them they should- I advised that currently they were having issues with it and suggested to take Protonix twice daily to see if it helps, and if it does- we could discuss the changes in medication.   Patient wife verbalized understanding.

## 2018-11-10 NOTE — Telephone Encounter (Signed)
Follow up:    The pharmacy calling concering this patient stating that the Zantac and ranitidine are on back oderThey need something else.

## 2018-11-10 NOTE — Telephone Encounter (Signed)
Any advice on changing this medication to something else since on backorder?

## 2018-11-15 ENCOUNTER — Telehealth: Payer: Self-pay | Admitting: Cardiology

## 2018-11-15 NOTE — Telephone Encounter (Signed)
Mychart sent via email, no smartphone, consent (verbal), pre reg complete 11/15/18 AF

## 2018-11-16 NOTE — Progress Notes (Signed)
Virtual Visit via Telephone Note   This visit type was conducted due to national recommendations for restrictions regarding the COVID-19 Pandemic (e.g. social distancing) in an effort to limit this patient's exposure and mitigate transmission in our community.  Due to his co-morbid illnesses, this patient is at least at moderate risk for complications without adequate follow up.  This format is felt to be most appropriate for this patient at this time.  The patient did not have access to video technology/had technical difficulties with video requiring transitioning to audio format only (telephone).  All issues noted in this document were discussed and addressed.  No physical exam could be performed with this format.  Please refer to the patient's chart for his  consent to telehealth for [facility].   Date:  11/20/2018   ID:  Arthur [name], DOB 04-Dec-1928, MRN [id number]  Patient Location: Home Provider Location: Office  PCP:  Kristen Loader, FNP  Cardiologist:  Peter Martinique MD Electrophysiologist:  None   Evaluation Performed:  Follow-Up Visit  Chief Complaint:  Atrial fibrillation  History of Present Illness:    Arthur [name] is a 85 y.o. male with a history of coronary disease and is status post CABG in 2005. He had mitral valve repair and a Maze procedure at that time. He subsequently developed recurrent CVA and TEE demonstrated persistent left atrial appendage thrombus despite a history of previous ligation of the appendage. He has been on chronic anticoagulation.  In April 2016 he presented with a near syncopal episode. He was found to be in atrial fibrillation. Managed with rate control and anticoagulation. Converted back to NSR.    He underwent nuclear stress test in 01/03/2015 revealed a normal ejection fraction however there was a moderate sized, severe intensity, reversible inferior defect consistent with moderate inferior ischemia. Cardiac cath 01/24/15 which showed severe  3v obstructive CAD with patent LIMA-LAD, SVG-diag, SVG-OM3 and occluded SVG-PDA. He had successful stenting of prox RCA with a DES.   He was admitted on 07/11/17 with left sided weakness with right gaze preference. INR subtherapeutic at admission and CTA head neck showed calcified plaque of aorta and carotid bifurcation, right M2 superior division proximal occlusion with poor down stream collateralization and multiple areas of mild to moderate intracranial atherosclerosis. He underwent cerebral angio with complete revascularization of R-ACA with IA tPA and IV tPA. MRI/MRA brain done revealing acute moderate R-MCA, small right distal ACA infarcts, small right posterior watershed territory infarct with minimal petechial hemorrhage and old large left cerebellar infarct with successful revascularization of R-MCA occlusion. Planned to restart anticoagulation with Eliquis after 7-10 days. Discharged from inpatient Rehab on 08/03/17 to SNF.   He was admitted to the hospital from 12/8-12/12/19 with urosepsis and E. Coli bacteremia. He had AKI with creatinine up to 1.49. this improved to 1.14 at discharge. He had Afib with RVR controlled with metoprolol. Continued on Eliquis. DC to SNF. Later was able to return home.   On follow up today he is doing well. Denies any chest pain, dyspnea, palpitation or edema. No new neurologic symptoms, muscle spasms improved.   The patient does not have symptoms concerning for COVID-19 infection (fever, chills, cough, or new shortness of breath).    Past Medical History:  Diagnosis Date  . Arthritis    "some in my joints" (01/24/2015)  . CKD (chronic kidney disease), stage II   . Coronary artery disease    a. s/p CABG in 2005 with MV repair and  MAZE procedure. b. s/p DES to prox RCA 01/2015.   Marland Kitchen CVA (cerebrovascular accident) [facility]) ~ 2014   RIGHT BRAIN; denies residual on 01/24/2015  . Esophagitis    Distal esophagitis  . GERD (gastroesophageal reflux disease)   . Heart  murmur   . History of hiatal hernia   . History of recurrent TIAs   . Hyperlipidemia   . Hypertension   . Hypertensive vascular disease   . MVP (mitral valve prolapse)   . Nephrolithiasis   . Odynophagia   . PAF (paroxysmal atrial fibrillation) ([place])   . Transudative pleural effusion    Past Surgical History:  Procedure Laterality Date  . APPENDECTOMY  1960's  . CARDIAC CATHETERIZATION  03/19/2004  . CARDIAC CATHETERIZATION N/A 01/24/2015   Procedure: Left Heart Cath and Coronary Angiography;  Surgeon: Peter M Martinique, MD;  Location: [place] CV LAB;  Service: Cardiovascular;  Laterality: N/A;  . CARDIAC CATHETERIZATION N/A 01/24/2015   Procedure: Coronary Stent Intervention;  Surgeon: Peter M Martinique, MD;  Location: [place] CV LAB;  Service: Cardiovascular;  Laterality: N/A;  . CORONARY ANGIOPLASTY WITH STENT PLACEMENT  01/24/2015   "1 stent"  . CORONARY ARTERY BYPASS GRAFT  04/2004   LIMA GRAFT TO THE DISTAL LAD, SAPHENOUS VEIN GRAFT TO THE FIRST DIADGONAL BRANCH, SAPHENOUS VEIN GRAFT TO THE THIRD MARIGINAL BRANCH, AND SAPHENOUS VEIN GRAFT TO THE PDA  . IR ANGIO VERTEBRAL SEL SUBCLAVIAN INNOMINATE UNI R MOD SED  07/11/2017  . IR PERCUTANEOUS ART THROMBECTOMY/INFUSION INTRACRANIAL INC DIAG ANGIO  07/11/2017  . MAZE  04/2004  . MITRAL VALVE REPAIR  04/2004  . RADIOLOGY WITH ANESTHESIA N/A 07/11/2017   Procedure: RADIOLOGY WITH ANESTHESIA;  Surgeon: Luanne Bras, MD;  Location: [place];  Service: Radiology;  Laterality: N/A;  . TEE WITHOUT CARDIOVERSION  05/19/2011   Procedure: TRANSESOPHAGEAL ECHOCARDIOGRAM (TEE);  Surgeon: Peter Martinique, MD;  Location[facility] ENDOSCOPY;  Service: Cardiovascular;  Laterality: N/A;  . TONSILLECTOMY AND ADENOIDECTOMY  1944     Current Meds  Medication Sig  . acetaminophen (TYLENOL) 650 MG CR tablet Take 650 mg by mouth daily as needed (knee pain).   . DULoxetine (CYMBALTA) 60 MG capsule Take 1 capsule by mouth at bedtime.  Marland Kitchen ELIQUIS 5 MG TABS tablet  TAKE 1 TABLET BY MOUTH TWO TIMES A DAY  . K Phos Mono-Sod Phos Di & Mono (PHOSPHA 250 NEUTRAL) [phone] MG TABS Take 1 tablet (250 mg total) by mouth daily after breakfast.  . metoprolol tartrate (LOPRESSOR) 25 MG tablet Take 1.5 tablets (37.5 mg total) by mouth 2 (two) times daily.  . montelukast (SINGULAIR) 10 MG tablet Take 1 tablet (10 mg total) by mouth daily after breakfast.  . NITROSTAT 0.4 MG SL tablet PLACE 1 TABLET (0.4 MG TOTAL) UNDER THE TONGUE EVERY 5 (FIVE) MINUTESAS NEEDED FOR CHEST PAIN (UP TO 3 DOSES). (Patient taking differently: Place 0.4 mg under the tongue every 5 (five) minutes as needed for chest pain. )  . pantoprazole (PROTONIX) 40 MG tablet Take 1 tablet (40 mg total) by mouth daily.  . rosuvastatin (CRESTOR) 20 MG tablet Take 1 tablet (20 mg total) by mouth daily.  Marland Kitchen tiZANidine (ZANAFLEX) 4 MG tablet Take 1 tablet by mouth daily as needed for muscle spasms.     Allergies:   Tape; Lasix [furosemide]; Penicillins; and Sulfa drugs cross reactors   Social History   Tobacco Use  . Smoking status: Former Smoker    Years: 3.00    Types: Cigarettes  Last attempt to quit: 06/15/1955    Years since quitting: 63.4  . Smokeless tobacco: Never Used  Substance Use Topics  . Alcohol use: No  . Drug use: No     Family Hx: The patient's family history includes Heart attack in his mother; Hypertension in his father; Kidney failure in his father; Stroke in his father.  ROS:   Please see the history of present illness.    All other systems reviewed and are negative.   Prior CV studies:   The following studies were reviewed today:  Echo 07/12/17:Study Conclusions  - Left ventricle: The cavity size was normal. Systolic function was normal. The estimated ejection fraction was in the range of 55% to 60%. Wall motion was normal; there were no regional wall motion abnormalities. Features are consistent with a pseudonormal left ventricular filling pattern, with  concomitant abnormal relaxation and increased filling pressure (grade 2 diastolic dysfunction). - Aortic valve: Transvalvular velocity was within the normal range. There was no stenosis. There was no regurgitation. - Aorta: Ascending aortic diameter: 38 mm (S). - Ascending aorta: The ascending aorta was mildly dilated. - Mitral valve: Prior procedures included surgical repair. Transvalvular velocity was within the normal range. There was no evidence for stenosis. There was mild regurgitation. Valve area by pressure half-time: 2.29 cm^2. - Left atrium: The atrium was moderately dilated. - Right ventricle: The cavity size was normal. Wall thickness was normal. Systolic function was normal. - Atrial septum: No defect or patent foramen ovale was identified. - Tricuspid valve: There was mild regurgitation. - Pulmonary arteries: Systolic pressure was severely increased. PA peak pressure: 56 mm Hg (S).   Labs/Other Tests and Data Reviewed:    EKG:  No ECG reviewed.  Recent Labs: 05/21/2018: B Natriuretic Peptide 207.5 05/24/2018: ALT 26; TSH 1.161 05/25/2018: Magnesium 2.2 08/15/2018: BUN 24; Creatinine, Ser 1.68; Hemoglobin 15.6; Platelets 232; Potassium 3.8; Sodium 139   Recent Lipid Panel Lab Results  Component Value Date/Time   CHOL 122 07/12/2017 03:59 AM   TRIG 92 07/12/2017 03:59 AM   TRIG 93 07/12/2017 03:59 AM   HDL 38 (L) 07/12/2017 03:59 AM   CHOLHDL 3.2 07/12/2017 03:59 AM   LDLCALC 66 07/12/2017 03:59 AM    Wt Readings from Last 3 Encounters:  05/25/18 169 lb 1.5 oz (76.7 kg)  11/18/17 162 lb (73.5 kg)  08/01/17 176 lb 5.9 oz (80 kg)     Objective:    Vital Signs:  BP (!) 142/86   Ht 5\' 7"  (1.702 m)   BMI 26.48 kg/m    VITAL SIGNS:  reviewed  ASSESSMENT & PLAN:    1.  CVA recurrent embolic right MCA. Remote TEE documented a left atrial appendage thrombus despite prior ligation of the atrial appendage. He had been on chronic anticoagulation  previously with coumadin. Now on Eliquis since coumadin subtherapeutic at time of CVA. Clinically stable.   2. Coronary disease status post CABG in 2005. He also had a Maze procedure at that time.  Cardiac cath in August 2016 showed all grafts were patent except SVG to the RCA that was occluded. He underwent successful stenting of the proximal RCA with DES. He is  asymptomatic.   3. Atrial fibrillation-status post Maze procedure. Will continue metoprolol and Eliquis.   4. Hypercholesterolemia. On chronic Crestor. Will check fasting labs with next OV.   5. HTN. Blood pressure is fairly well controlled. Continue Rx.    COVID-19 Education: The signs and symptoms of COVID-19 were discussed  with the patient and how to seek care for testing (follow up with PCP or arrange E-visit).  The importance of social distancing was discussed today.  Time:   Today, I have spent 15 minutes with the patient with telehealth technology discussing the above problems.     Medication Adjustments/Labs and Tests Ordered: Current medicines are reviewed at length with the patient today.  Concerns regarding medicines are outlined above.   Tests Ordered: No orders of the defined types were placed in this encounter.   Medication Changes: No orders of the defined types were placed in this encounter.   Disposition:  Follow up in 6 month(s)  Signed, Peter Martinique, MD  11/20/2018 2:43 PM    [place]

## 2018-11-20 ENCOUNTER — Telehealth (INDEPENDENT_AMBULATORY_CARE_PROVIDER_SITE_OTHER): Payer: Medicare Other | Admitting: Cardiology

## 2018-11-20 ENCOUNTER — Encounter: Payer: Self-pay | Admitting: Cardiology

## 2018-11-20 VITALS — BP 142/86 | Ht 67.0 in

## 2018-11-20 DIAGNOSIS — I25810 Atherosclerosis of coronary artery bypass graft(s) without angina pectoris: Secondary | ICD-10-CM

## 2018-11-20 DIAGNOSIS — I480 Paroxysmal atrial fibrillation: Secondary | ICD-10-CM | POA: Diagnosis not present

## 2018-11-20 DIAGNOSIS — E7800 Pure hypercholesterolemia, unspecified: Secondary | ICD-10-CM

## 2018-11-20 DIAGNOSIS — I10 Essential (primary) hypertension: Secondary | ICD-10-CM

## 2018-11-20 NOTE — Patient Instructions (Signed)
Medication Instructions:  Continue same medications If you need a refill on your cardiac medications before your next appointment, please call your pharmacy.   Lab work: Fasting labs in 6 months have done 3 to 5 days before appointment.No lab appointment needed Nothing to eat after midnight you may have water.Lab open 8:00 am to 12:00 noon.  Testing/Procedures: None ordered  Follow-Up: At [facility], you and your health needs are our priority.  As part of our continuing mission to provide you with exceptional heart care, we have created designated Provider Care Teams.  These Care Teams include your primary Cardiologist (physician) and Advanced Practice Providers (APPs -  Physician Assistants and Nurse Practitioners) who all work together to provide you with the care you need, when you need it. . Schedule follow up appointment in 6 months    Call 3 months before to schedule

## 2018-12-04 DIAGNOSIS — R350 Frequency of micturition: Secondary | ICD-10-CM | POA: Diagnosis not present

## 2018-12-08 ENCOUNTER — Other Ambulatory Visit: Payer: Self-pay | Admitting: Cardiology

## 2019-04-02 DIAGNOSIS — M25552 Pain in left hip: Secondary | ICD-10-CM | POA: Diagnosis not present

## 2019-04-02 DIAGNOSIS — I4891 Unspecified atrial fibrillation: Secondary | ICD-10-CM | POA: Diagnosis not present

## 2019-04-02 DIAGNOSIS — D6869 Other thrombophilia: Secondary | ICD-10-CM | POA: Diagnosis not present

## 2019-05-16 ENCOUNTER — Telehealth: Payer: Self-pay | Admitting: Cardiology

## 2019-05-16 NOTE — Telephone Encounter (Signed)
New Message:   Pt's wife wants to know because of COVID, if Dr Martinique thinks pt should have a  Phone Visit or come in for his appointment on Monday?i

## 2019-05-17 NOTE — Telephone Encounter (Signed)
Spoke to wife she stated husband has appointment with Dr.Jordan 12/7 and she wanted to come back with him.Advised she can come back at his appointment.

## 2019-05-18 NOTE — Progress Notes (Signed)
Arthur [name] Date of Birth: [birth year]   History of Present Illness: Arthur [name] is seen for followup of CAD. He has a history of coronary disease and is status post CABG in 2005. He had mitral valve repair and a Maze procedure at that time. He subsequently developed recurrent CVA and TEE demonstrated persistent left atrial appendage thrombus despite a history of previous ligation of the appendage. He has been on chronic anticoagulation.  In April 2016 he presented with a near syncopal episode. He was found to be in atrial fibrillation. Managed with rate control and anticoagulation. Converted back to NSR.    He underwent nuclear stress test in 01/03/2015 revealed a normal ejection fraction however there was a moderate sized, severe intensity, reversible inferior defect consistent with moderate inferior ischemia. Cardiac cath 01/24/15 which showed severe 3v obstructive CAD with patent LIMA-LAD, SVG-diag, SVG-OM3 and occluded SVG-PDA. He had successful stenting of prox RCA with a DES.   He was admitted on 07/11/17 with left sided weakness with right gaze preference. INR subtherapeutic at admission and CTA head neck showed calcified plaque of aorta and carotid bifurcation, right M2 superior division proximal occlusion with poor down stream collateralization and multiple areas of mild to moderate intracranial atherosclerosis. He underwent cerebral angio with complete revascularization of R-ACA with IA tPA and IV tPA. MRI/MRA brain done revealing acute moderate R-MCA, small right distal ACA infarcts, small right posterior watershed territory infarct with minimal petechial hemorrhage and old large left cerebellar infarct with successful revascularization of R-MCA occlusion. Planned to restart anticoagulation with Eliquis after 7-10 days. Discharged from inpatient Rehab on 08/03/17 to SNF.   He was admitted to the hospital from 12/8-12/12/19 with urosepsis and E. Coli bacteremia. He had AKI with creatinine up  to 1.49. this improved to 1.14 at discharge. He had Afib with RVR controlled with metoprolol. Continued on Eliquis. DC to SNF. Now living at home.  He is in a wheelchair. He has a sitter/CNA that helps with ADLs.  He does have a cough with clear sputum. No fever. Takes Mucinex. Denies any Chest pain or edema. No dizziness. No bleeding. BP at home runs [id number] systolic.    Current Outpatient Medications on File Prior to Visit  Medication Sig Dispense Refill  . acetaminophen (TYLENOL) 650 MG CR tablet Take 650 mg by mouth daily as needed (knee pain).     . DULoxetine (CYMBALTA) 60 MG capsule Take 1 capsule by mouth at bedtime.    . K Phos Mono-Sod Phos Di & Mono (PHOSPHA 250 NEUTRAL) 155-852-130 MG TABS Take 1 tablet (250 mg total) by mouth daily after breakfast. 90 tablet 3  . montelukast (SINGULAIR) 10 MG tablet Take 1 tablet (10 mg total) by mouth daily after breakfast. 90 tablet 3  . NITROSTAT 0.4 MG SL tablet PLACE 1 TABLET (0.4 MG TOTAL) UNDER THE TONGUE EVERY 5 (FIVE) MINUTESAS NEEDED FOR CHEST PAIN (UP TO 3 DOSES). (Patient taking differently: Place 0.4 mg under the tongue every 5 (five) minutes as needed for chest pain. ) 25 tablet 2  . pantoprazole (PROTONIX) 40 MG tablet Take 1 tablet (40 mg total) by mouth daily. 90 tablet 3  . tiZANidine (ZANAFLEX) 4 MG tablet Take 1 tablet by mouth daily as needed for muscle spasms.     No current facility-administered medications on file prior to visit.     Allergies  Allergen Reactions  . Tape Other (See Comments)    PATIENT IS TAKING COUMADIN; HIS SKIN TEARS &  BRUISES EASILY; PLEASE USE COBAN WRAP OR AN ALTERNATIVE TO MEDICAL TAPE!!  . Lasix [Furosemide] Rash  . Penicillins Rash    Has patient had a PCN reaction causing immediate rash, facial/tongue/throat swelling, SOB or lightheadedness with hypotension: Yes Has patient had a PCN reaction causing severe rash involving mucus membranes or skin necrosis: Unknown Has patient had a PCN reaction  that required hospitalization: Unknown Has patient had a PCN reaction occurring within the last 10 years: unknown If all of the above answers are "NO", then may proceed with Cephalosporin use.   Arthur [name] Drugs Cross Reactors Rash    Past Medical History:  Diagnosis Date  . Arthritis    "some in my joints" (01/24/2015)  . CKD (chronic kidney disease), stage II   . Coronary artery disease    a. s/p CABG in 2005 with MV repair and MAZE procedure. b. s/p DES to prox RCA 01/2015.   Marland Kitchen CVA (cerebrovascular accident) [facility]) ~ 2014   RIGHT BRAIN; denies residual on 01/24/2015  . Esophagitis    Distal esophagitis  . GERD (gastroesophageal reflux disease)   . Heart murmur   . History of hiatal hernia   . History of recurrent TIAs   . Hyperlipidemia   . Hypertension   . Hypertensive vascular disease   . MVP (mitral valve prolapse)   . Nephrolithiasis   . Odynophagia   . PAF (paroxysmal atrial fibrillation) ([place])   . Transudative pleural effusion     Past Surgical History:  Procedure Laterality Date  . APPENDECTOMY  1960's  . CARDIAC CATHETERIZATION  03/19/2004  . CARDIAC CATHETERIZATION N/A 01/24/2015   Procedure: Left Heart Cath and Coronary Angiography;  Surgeon: Peter M Martinique, MD;  Location: [place] [facility];  Service: Cardiovascular;  Laterality: N/A;  . CARDIAC CATHETERIZATION N/A 01/24/2015   Procedure: Coronary Stent Intervention;  Surgeon: Peter M Martinique, MD;  Location: [place] [facility];  Service: Cardiovascular;  Laterality: N/A;  . CORONARY ANGIOPLASTY WITH STENT PLACEMENT  01/24/2015   "1 stent"  . CORONARY ARTERY BYPASS GRAFT  04/2004   LIMA GRAFT TO THE DISTAL LAD, SAPHENOUS VEIN GRAFT TO THE FIRST DIADGONAL BRANCH, SAPHENOUS VEIN GRAFT TO THE THIRD MARIGINAL BRANCH, AND SAPHENOUS VEIN GRAFT TO THE PDA  . IR ANGIO VERTEBRAL SEL SUBCLAVIAN INNOMINATE UNI R MOD SED  07/11/2017  . IR PERCUTANEOUS ART THROMBECTOMY/INFUSION INTRACRANIAL INC DIAG ANGIO  07/11/2017  . MAZE  04/2004   . MITRAL VALVE REPAIR  04/2004  . RADIOLOGY WITH ANESTHESIA N/A 07/11/2017   Procedure: RADIOLOGY WITH ANESTHESIA;  Surgeon: Luanne Bras, MD;  Location: [place];  Service: Radiology;  Laterality: N/A;  . TEE WITHOUT CARDIOVERSION  05/19/2011   Procedure: TRANSESOPHAGEAL ECHOCARDIOGRAM (TEE);  Surgeon: Peter Martinique, MD;  Location: [place];  Service: Cardiovascular;  Laterality: N/A;  . TONSILLECTOMY AND ADENOIDECTOMY  1944    Social History   Tobacco Use  Smoking Status Former Smoker  . Years: 3.00  . Types: Cigarettes  . Quit date: 06/15/1955  . Years since quitting: 63.9  Smokeless Tobacco Never Used    Social History   Substance and Sexual Activity  Alcohol Use No    Family History  Problem Relation Age of Onset  . Heart attack Mother   . Stroke Father   . Hypertension Father   . Kidney failure Father     Review of Systems: As noted in history of present illness.  All other systems were reviewed and are negative.  Physical  Exam: BP (!) 165/100   Pulse 76   Ht 5\' 7"  (1.702 m)   BMI 26.48 kg/m  GENERAL:  Well appearing elderly WM in NAD HEENT:  PERRL, EOMI, sclera are clear. Oropharynx is clear. NECK:  No jugular venous distention, carotid upstroke brisk and symmetric, no bruits, no thyromegaly or adenopathy LUNGS:  Clear to auscultation bilaterally CHEST:  Unremarkable HEART:  IRRR,  PMI not displaced or sustained,S1 and S2 within normal limits, no S3, no S4: no clicks, no rubs, no murmurs ABD:  Soft, nontender. BS +, no masses or bruits. No hepatomegaly, no splenomegaly EXT:  2 + pulses throughout, no edema, left leg in brace. SKIN:  Warm and dry.  No rashes NEURO:  Alert and oriented x 3. Cranial nerves II through XII intact. PSYCH:  Cognitively intact     LABORATORY DATA:   Lab Results  Component Value Date   WBC 13.4 (H) 08/15/2018   HGB 15.6 08/15/2018   HCT 49.3 08/15/2018   PLT 232 08/15/2018   GLUCOSE 128 (H) 08/15/2018   CHOL 122  07/12/2017   TRIG 92 07/12/2017   TRIG 93 07/12/2017   HDL 38 (L) 07/12/2017   LDLCALC 66 07/12/2017   ALT 26 05/24/2018   AST 35 05/24/2018   NA 139 08/15/2018   K 3.8 08/15/2018   CL 106 08/15/2018   CREATININE 1.68 (H) 08/15/2018   BUN 24 (H) 08/15/2018   CO2 23 08/15/2018   TSH 1.161 05/24/2018   INR 1.29 05/21/2018   HGBA1C 5.6 07/13/2017    Echo 07/12/17:Study Conclusions  - Left ventricle: The cavity size was normal. Systolic function was   normal. The estimated ejection fraction was in the range of 55%   to 60%. Wall motion was normal; there were no regional wall   motion abnormalities. Features are consistent with a pseudonormal   left ventricular filling pattern, with concomitant abnormal   relaxation and increased filling pressure (grade 2 diastolic   dysfunction). - Aortic valve: Transvalvular velocity was within the normal range.   There was no stenosis. There was no regurgitation. - Aorta: Ascending aortic diameter: 38 mm (S). - Ascending aorta: The ascending aorta was mildly dilated. - Mitral valve: Prior procedures included surgical repair.   Transvalvular velocity was within the normal range. There was no   evidence for stenosis. There was mild regurgitation. Valve area   by pressure half-time: 2.29 cm^2. - Left atrium: The atrium was moderately dilated. - Right ventricle: The cavity size was normal. Wall thickness was   normal. Systolic function was normal. - Atrial septum: No defect or patent foramen ovale was identified. - Tricuspid valve: There was mild regurgitation. - Pulmonary arteries: Systolic pressure was severely increased. PA   peak pressure: 56 mm Hg (S).    Assessment / Plan: 1.  CVA recurrent embolic right MCA. Remote TEE documented a left atrial appendage thrombus despite prior ligation of the atrial appendage. He had been on chronic anticoagulation previously with coumadin. Now on Eliquis since coumadin subtherapeutic at time of CVA.    2. Coronary disease status post CABG in 2005. He also had a Maze procedure at that time.  Cardiac cath in August 2016 showed all grafts were patent except SVG to the RCA that was occluded. He underwent successful stenting of the proximal RCA with DES. He remains  asymptomatic.   3. Atrial fibrillation-status post Maze procedure. Will continue metoprolol and Eliquis.  Now still in Afib but rate well controlled on  metoprolol.   4. Hypercholesterolemia. On chronic Crestor.   5. HTN. Blood pressure is high today but generally well controlled. Continue Rx.  We will give flu vaccine today. Check lab work including CBC, CMET, lipids.    Follow up in 6 months

## 2019-05-21 ENCOUNTER — Other Ambulatory Visit: Payer: Self-pay

## 2019-05-21 ENCOUNTER — Encounter: Payer: Self-pay | Admitting: Cardiology

## 2019-05-21 ENCOUNTER — Ambulatory Visit (INDEPENDENT_AMBULATORY_CARE_PROVIDER_SITE_OTHER): Payer: Medicare Other | Admitting: Cardiology

## 2019-05-21 VITALS — BP 165/100 | HR 76 | Ht 67.0 in

## 2019-05-21 DIAGNOSIS — I25810 Atherosclerosis of coronary artery bypass graft(s) without angina pectoris: Secondary | ICD-10-CM

## 2019-05-21 DIAGNOSIS — I639 Cerebral infarction, unspecified: Secondary | ICD-10-CM

## 2019-05-21 DIAGNOSIS — Z23 Encounter for immunization: Secondary | ICD-10-CM | POA: Diagnosis not present

## 2019-05-21 DIAGNOSIS — E7800 Pure hypercholesterolemia, unspecified: Secondary | ICD-10-CM | POA: Diagnosis not present

## 2019-05-21 MED ORDER — METOPROLOL TARTRATE 25 MG PO TABS: 37.5000 mg | ORAL_TABLET | Freq: Two times a day (BID) | ORAL | 3 refills | Status: DC

## 2019-05-21 MED ORDER — ROSUVASTATIN CALCIUM 20 MG PO TABS: 20.0000 mg | ORAL_TABLET | Freq: Every day | ORAL | 3 refills | Status: DC

## 2019-05-21 MED ORDER — APIXABAN 5 MG PO TABS: 5.0000 mg | ORAL_TABLET | Freq: Two times a day (BID) | ORAL | 3 refills | Status: DC

## 2019-05-21 NOTE — Patient Instructions (Signed)
Medication Instructions:  Continue same medications   Lab Work: Cmet,lipid panel,cbc today    Testing/Procedures: None ordered  Follow-Up: At [facility], you and your health needs are our priority.  As part of our continuing mission to provide you with exceptional heart care, we have created designated Provider Care Teams.  These Care Teams include your primary Cardiologist (physician) and Advanced Practice Providers (APPs -  Physician Assistants and Nurse Practitioners) who all work together to provide you with the care you need, when you need it.  Your next appointment:  6 months   Call in March to schedule June appointment   The format for your next appointment:  Office   Provider:  Dr.Jordan

## 2019-05-22 LAB — COMPREHENSIVE METABOLIC PANEL
ALT: 15 IU/L (ref 0–44)
AST: 22 IU/L (ref 0–40)
Albumin/Globulin Ratio: 1.9 (ref 1.2–2.2)
Albumin: 4.4 g/dL (ref 3.6–4.6)
Alkaline Phosphatase: 75 IU/L (ref 39–117)
BUN/Creatinine Ratio: 11 (ref 10–24)
BUN: 14 mg/dL (ref 8–27)
Bilirubin Total: 0.8 mg/dL (ref 0.0–1.2)
CO2: 21 mmol/L (ref 20–29)
Calcium: 9.4 mg/dL (ref 8.6–10.2)
Chloride: 103 mmol/L (ref 96–106)
Creatinine, Ser: 1.27 mg/dL (ref 0.76–1.27)
GFR calc Af Amer: 58 mL/min/{1.73_m2} — ABNORMAL LOW (ref >59)
GFR calc non Af Amer: 50 mL/min/{1.73_m2} — ABNORMAL LOW (ref >59)
Globulin, Total: 2.3 g/dL (ref 1.5–4.5)
Glucose: 89 mg/dL (ref 65–99)
Potassium: 4.3 mmol/L (ref 3.5–5.2)
Sodium: 144 mmol/L (ref 134–144)
Total Protein: 6.7 g/dL (ref 6.0–8.5)

## 2019-05-22 LAB — CBC WITH DIFFERENTIAL/PLATELET
Basophils Absolute: 0.1 10*3/uL (ref 0.0–0.2)
Basos: 1 %
EOS (ABSOLUTE): 0.3 10*3/uL (ref 0.0–0.4)
Eos: 4 %
Hematocrit: 50.3 % (ref 37.5–51.0)
Hemoglobin: 16.8 g/dL (ref 13.0–17.7)
Immature Grans (Abs): 0 10*3/uL (ref 0.0–0.1)
Immature Granulocytes: 0 %
Lymphocytes Absolute: 1.1 10*3/uL (ref 0.7–3.1)
Lymphs: 16 %
MCH: 30.6 pg (ref 26.6–33.0)
MCHC: 33.4 g/dL (ref 31.5–35.7)
MCV: 92 fL (ref 79–97)
Monocytes Absolute: 0.6 10*3/uL (ref 0.1–0.9)
Monocytes: 9 %
Neutrophils Absolute: 5 10*3/uL (ref 1.4–7.0)
Neutrophils: 70 %
Platelets: 204 10*3/uL (ref 150–450)
RBC: 5.49 x10E6/uL (ref 4.14–5.80)
RDW: 13.4 % (ref 11.6–15.4)
WBC: 7 10*3/uL (ref 3.4–10.8)

## 2019-05-22 LAB — LIPID PANEL
Chol/HDL Ratio: 3.1 ratio (ref 0.0–5.0)
Cholesterol, Total: 142 mg/dL (ref 100–199)
HDL: 46 mg/dL (ref >39)
LDL Chol Calc (NIH): 77 mg/dL (ref 0–99)
Triglycerides: 105 mg/dL (ref 0–149)
VLDL Cholesterol Cal: 19 mg/dL (ref 5–40)

## 2019-06-26 DIAGNOSIS — Z711 Person with feared health complaint in whom no diagnosis is made: Secondary | ICD-10-CM | POA: Diagnosis not present

## 2019-07-25 ENCOUNTER — Other Ambulatory Visit: Payer: Self-pay | Admitting: Cardiology

## 2019-08-02 ENCOUNTER — Other Ambulatory Visit: Payer: Self-pay | Admitting: Cardiology

## 2019-08-27 ENCOUNTER — Other Ambulatory Visit: Payer: Self-pay | Admitting: Cardiology

## 2019-09-03 DIAGNOSIS — Z23 Encounter for immunization: Secondary | ICD-10-CM | POA: Diagnosis not present

## 2019-10-03 DIAGNOSIS — Z23 Encounter for immunization: Secondary | ICD-10-CM | POA: Diagnosis not present

## 2019-10-26 DIAGNOSIS — Z8673 Personal history of transient ischemic attack (TIA), and cerebral infarction without residual deficits: Secondary | ICD-10-CM | POA: Diagnosis not present

## 2019-10-26 DIAGNOSIS — Z993 Dependence on wheelchair: Secondary | ICD-10-CM | POA: Diagnosis not present

## 2019-10-26 DIAGNOSIS — I4891 Unspecified atrial fibrillation: Secondary | ICD-10-CM | POA: Diagnosis not present

## 2019-10-26 DIAGNOSIS — E785 Hyperlipidemia, unspecified: Secondary | ICD-10-CM | POA: Diagnosis not present

## 2019-10-26 DIAGNOSIS — Z951 Presence of aortocoronary bypass graft: Secondary | ICD-10-CM | POA: Diagnosis not present

## 2019-10-26 DIAGNOSIS — I69354 Hemiplegia and hemiparesis following cerebral infarction affecting left non-dominant side: Secondary | ICD-10-CM | POA: Diagnosis not present

## 2019-10-26 DIAGNOSIS — Z7901 Long term (current) use of anticoagulants: Secondary | ICD-10-CM | POA: Diagnosis not present

## 2019-10-26 DIAGNOSIS — Z952 Presence of prosthetic heart valve: Secondary | ICD-10-CM | POA: Diagnosis not present

## 2019-10-26 DIAGNOSIS — Z87442 Personal history of urinary calculi: Secondary | ICD-10-CM | POA: Diagnosis not present

## 2019-10-26 DIAGNOSIS — Z87891 Personal history of nicotine dependence: Secondary | ICD-10-CM | POA: Diagnosis not present

## 2019-10-26 DIAGNOSIS — I10 Essential (primary) hypertension: Secondary | ICD-10-CM | POA: Diagnosis not present

## 2019-10-26 DIAGNOSIS — Z9181 History of falling: Secondary | ICD-10-CM | POA: Diagnosis not present

## 2019-10-26 DIAGNOSIS — I2510 Atherosclerotic heart disease of native coronary artery without angina pectoris: Secondary | ICD-10-CM | POA: Diagnosis not present

## 2019-10-31 DIAGNOSIS — I69354 Hemiplegia and hemiparesis following cerebral infarction affecting left non-dominant side: Secondary | ICD-10-CM | POA: Diagnosis not present

## 2019-10-31 DIAGNOSIS — I2510 Atherosclerotic heart disease of native coronary artery without angina pectoris: Secondary | ICD-10-CM | POA: Diagnosis not present

## 2019-10-31 DIAGNOSIS — I4891 Unspecified atrial fibrillation: Secondary | ICD-10-CM | POA: Diagnosis not present

## 2019-10-31 DIAGNOSIS — E785 Hyperlipidemia, unspecified: Secondary | ICD-10-CM | POA: Diagnosis not present

## 2019-10-31 DIAGNOSIS — Z951 Presence of aortocoronary bypass graft: Secondary | ICD-10-CM | POA: Diagnosis not present

## 2019-10-31 DIAGNOSIS — I10 Essential (primary) hypertension: Secondary | ICD-10-CM | POA: Diagnosis not present

## 2019-11-01 DIAGNOSIS — I2510 Atherosclerotic heart disease of native coronary artery without angina pectoris: Secondary | ICD-10-CM | POA: Diagnosis not present

## 2019-11-01 DIAGNOSIS — I10 Essential (primary) hypertension: Secondary | ICD-10-CM | POA: Diagnosis not present

## 2019-11-01 DIAGNOSIS — I4891 Unspecified atrial fibrillation: Secondary | ICD-10-CM | POA: Diagnosis not present

## 2019-11-01 DIAGNOSIS — Z951 Presence of aortocoronary bypass graft: Secondary | ICD-10-CM | POA: Diagnosis not present

## 2019-11-01 DIAGNOSIS — E785 Hyperlipidemia, unspecified: Secondary | ICD-10-CM | POA: Diagnosis not present

## 2019-11-01 DIAGNOSIS — I69354 Hemiplegia and hemiparesis following cerebral infarction affecting left non-dominant side: Secondary | ICD-10-CM | POA: Diagnosis not present

## 2019-11-02 DIAGNOSIS — L905 Scar conditions and fibrosis of skin: Secondary | ICD-10-CM | POA: Diagnosis not present

## 2019-11-02 DIAGNOSIS — L980 Pyogenic granuloma: Secondary | ICD-10-CM | POA: Diagnosis not present

## 2019-11-02 DIAGNOSIS — I69354 Hemiplegia and hemiparesis following cerebral infarction affecting left non-dominant side: Secondary | ICD-10-CM | POA: Diagnosis not present

## 2019-11-02 DIAGNOSIS — Z951 Presence of aortocoronary bypass graft: Secondary | ICD-10-CM | POA: Diagnosis not present

## 2019-11-02 DIAGNOSIS — C4441 Basal cell carcinoma of skin of scalp and neck: Secondary | ICD-10-CM | POA: Diagnosis not present

## 2019-11-02 DIAGNOSIS — I2510 Atherosclerotic heart disease of native coronary artery without angina pectoris: Secondary | ICD-10-CM | POA: Diagnosis not present

## 2019-11-02 DIAGNOSIS — D485 Neoplasm of uncertain behavior of skin: Secondary | ICD-10-CM | POA: Diagnosis not present

## 2019-11-02 DIAGNOSIS — D225 Melanocytic nevi of trunk: Secondary | ICD-10-CM | POA: Diagnosis not present

## 2019-11-02 DIAGNOSIS — E785 Hyperlipidemia, unspecified: Secondary | ICD-10-CM | POA: Diagnosis not present

## 2019-11-02 DIAGNOSIS — I10 Essential (primary) hypertension: Secondary | ICD-10-CM | POA: Diagnosis not present

## 2019-11-02 DIAGNOSIS — I4891 Unspecified atrial fibrillation: Secondary | ICD-10-CM | POA: Diagnosis not present

## 2019-11-05 DIAGNOSIS — Z951 Presence of aortocoronary bypass graft: Secondary | ICD-10-CM | POA: Diagnosis not present

## 2019-11-05 DIAGNOSIS — I2510 Atherosclerotic heart disease of native coronary artery without angina pectoris: Secondary | ICD-10-CM | POA: Diagnosis not present

## 2019-11-05 DIAGNOSIS — I69354 Hemiplegia and hemiparesis following cerebral infarction affecting left non-dominant side: Secondary | ICD-10-CM | POA: Diagnosis not present

## 2019-11-05 DIAGNOSIS — E785 Hyperlipidemia, unspecified: Secondary | ICD-10-CM | POA: Diagnosis not present

## 2019-11-05 DIAGNOSIS — I10 Essential (primary) hypertension: Secondary | ICD-10-CM | POA: Diagnosis not present

## 2019-11-05 DIAGNOSIS — I4891 Unspecified atrial fibrillation: Secondary | ICD-10-CM | POA: Diagnosis not present

## 2019-11-08 DIAGNOSIS — I10 Essential (primary) hypertension: Secondary | ICD-10-CM | POA: Diagnosis not present

## 2019-11-08 DIAGNOSIS — E785 Hyperlipidemia, unspecified: Secondary | ICD-10-CM | POA: Diagnosis not present

## 2019-11-08 DIAGNOSIS — I69354 Hemiplegia and hemiparesis following cerebral infarction affecting left non-dominant side: Secondary | ICD-10-CM | POA: Diagnosis not present

## 2019-11-08 DIAGNOSIS — I2510 Atherosclerotic heart disease of native coronary artery without angina pectoris: Secondary | ICD-10-CM | POA: Diagnosis not present

## 2019-11-08 DIAGNOSIS — I4891 Unspecified atrial fibrillation: Secondary | ICD-10-CM | POA: Diagnosis not present

## 2019-11-08 DIAGNOSIS — Z951 Presence of aortocoronary bypass graft: Secondary | ICD-10-CM | POA: Diagnosis not present

## 2019-11-10 NOTE — Progress Notes (Signed)
Arthur [name] Date of Birth: 02-24-1929   History of Present Illness: Arthur [name] is seen for followup of CAD. He has a history of coronary disease and is status post CABG in 2005. He had mitral valve repair and a Maze procedure at that time. He subsequently developed recurrent CVA and TEE demonstrated persistent left atrial appendage thrombus despite a history of previous ligation of the appendage. He has been on chronic anticoagulation.  In April 2016 he presented with a near syncopal episode. He was found to be in atrial fibrillation. Managed with rate control and anticoagulation. Converted back to NSR.    He underwent nuclear stress test in 01/03/2015 revealed a normal ejection fraction however there was a moderate sized, severe intensity, reversible inferior defect consistent with moderate inferior ischemia. Cardiac cath 01/24/15 which showed severe 3v obstructive CAD with patent LIMA-LAD, SVG-diag, SVG-OM3 and occluded SVG-PDA. He had successful stenting of prox RCA with a DES.   He was admitted on 07/11/17 with left sided weakness with right gaze preference. INR subtherapeutic at admission and CTA head neck showed calcified plaque of aorta and carotid bifurcation, right M2 superior division proximal occlusion with poor down stream collateralization and multiple areas of mild to moderate intracranial atherosclerosis. He underwent cerebral angio with complete revascularization of R-ACA with IA tPA and IV tPA. MRI/MRA brain done revealing acute moderate R-MCA, small right distal ACA infarcts, small right posterior watershed territory infarct with minimal petechial hemorrhage and old large left cerebellar infarct with successful revascularization of R-MCA occlusion. Planned to restart anticoagulation with Eliquis after 7-10 days. Discharged from inpatient Rehab on 08/03/17 to SNF.   He was admitted to the hospital from 12/8-12/12/19 with urosepsis and E. Coli bacteremia. He had AKI with creatinine up  to 1.49. this improved to 1.14 at discharge. He had Afib with RVR controlled with metoprolol. Continued on Eliquis. DC to SNF. Now living at home.  On follow up today he is seen with his wife. He is in a wheelchair. He has a sitter/CNA that helps with ADLs. He reports he is doing well.   Denies any Chest pain or edema. No dizziness. No bleeding. No SOB. BP at home is well controlled. No new Neurologic issues.   Current Outpatient Medications on File Prior to Visit  Medication Sig Dispense Refill   acetaminophen (TYLENOL) 650 MG CR tablet Take 650 mg by mouth daily as needed (knee pain).      apixaban (ELIQUIS) 5 MG TABS tablet Take 1 tablet (5 mg total) by mouth 2 (two) times daily. 180 tablet 3   DULoxetine (CYMBALTA) 30 MG capsule Take 30 mg by mouth daily.     K Phos Mono-Sod Phos Di & Mono (PHOSPHA 250 NEUTRAL) 155-852-130 MG TABS TAKE ONE TABLET BY MOUTH DAILY AFTER BREAKFAST 90 tablet 2   metoprolol tartrate (LOPRESSOR) 25 MG tablet Take 1.5 tablets (37.5 mg total) by mouth 2 (two) times daily. 270 tablet 3   montelukast (SINGULAIR) 10 MG tablet TAKE ONE TABLET BY MOUTH DAILY AFTER BREAKFAST 90 tablet 2   NITROSTAT 0.4 MG SL tablet PLACE 1 TABLET (0.4 MG TOTAL) UNDER THE TONGUE EVERY 5 (FIVE) MINUTESAS NEEDED FOR CHEST PAIN (UP TO 3 DOSES). (Patient taking differently: Place 0.4 mg under the tongue every 5 (five) minutes as needed for chest pain. ) 25 tablet 2   pantoprazole (PROTONIX) 40 MG tablet TAKE ONE TABLET BY MOUTH DAILY 90 tablet 3   rosuvastatin (CRESTOR) 20 MG tablet Take 1 tablet (  20 mg total) by mouth daily. 90 tablet 3   tiZANidine (ZANAFLEX) 4 MG tablet Take 1 tablet by mouth daily as needed for muscle spasms.     No current facility-administered medications on file prior to visit.    Allergies  Allergen Reactions   Tape Other (See Comments)    PATIENT IS TAKING COUMADIN; HIS SKIN TEARS & BRUISES EASILY; PLEASE USE COBAN WRAP OR AN ALTERNATIVE TO MEDICAL TAPE!!    Lasix [Furosemide] Rash   Penicillins Rash    Has patient had a PCN reaction causing immediate rash, facial/tongue/throat swelling, SOB or lightheadedness with hypotension: Yes Has patient had a PCN reaction causing severe rash involving mucus membranes or skin necrosis: Unknown Has patient had a PCN reaction that required hospitalization: Unknown Has patient had a PCN reaction occurring within the last 10 years: unknown If all of the above answers are "NO", then may proceed with Cephalosporin use.    Sulfa Drugs Cross Reactors Rash    Past Medical History:  Diagnosis Date   Arthritis    "some in my joints" (01/24/2015)   CKD (chronic kidney disease), stage II    Coronary artery disease    a. s/p CABG in 2005 with MV repair and MAZE procedure. b. s/p DES to prox RCA 01/2015.    CVA (cerebrovascular accident) ([place]) ~ 2014   RIGHT BRAIN; denies residual on 01/24/2015   Esophagitis    Distal esophagitis   GERD (gastroesophageal reflux disease)    Heart murmur    History of hiatal hernia    History of recurrent TIAs    Hyperlipidemia    Hypertension    Hypertensive vascular disease    MVP (mitral valve prolapse)    Nephrolithiasis    Odynophagia    PAF (paroxysmal atrial fibrillation) ([facility])    Transudative pleural effusion     Past Surgical History:  Procedure Laterality Date   APPENDECTOMY  1960's   CARDIAC CATHETERIZATION  03/19/2004   CARDIAC CATHETERIZATION N/A 01/24/2015   Procedure: Left Heart Cath and Coronary Angiography;  Surgeon: Chastity Noland M Martinique, MD;  Location: [place] [facility];  Service: Cardiovascular;  Laterality: N/A;   CARDIAC CATHETERIZATION N/A 01/24/2015   Procedure: Coronary Stent Intervention;  Surgeon: Janeece Blok M Martinique, MD;  Location: [place] [facility];  Service: Cardiovascular;  Laterality: N/A;   CORONARY ANGIOPLASTY WITH STENT PLACEMENT  01/24/2015   "1 stent"   CORONARY ARTERY BYPASS GRAFT  04/2004   LIMA GRAFT TO THE DISTAL  LAD, SAPHENOUS VEIN GRAFT TO THE FIRST DIADGONAL BRANCH, SAPHENOUS VEIN GRAFT TO THE THIRD MARIGINAL BRANCH, AND SAPHENOUS VEIN GRAFT TO THE PDA   IR ANGIO VERTEBRAL SEL SUBCLAVIAN INNOMINATE UNI R MOD SED  07/11/2017   IR PERCUTANEOUS ART THROMBECTOMY/INFUSION INTRACRANIAL INC DIAG ANGIO  07/11/2017   MAZE  04/2004   MITRAL VALVE REPAIR  04/2004   RADIOLOGY WITH ANESTHESIA N/A 07/11/2017   Procedure: RADIOLOGY WITH ANESTHESIA;  Surgeon: Luanne Bras, MD;  Location: [place];  Service: Radiology;  Laterality: N/A;   TEE WITHOUT CARDIOVERSION  05/19/2011   Procedure: TRANSESOPHAGEAL ECHOCARDIOGRAM (TEE);  Surgeon: Truman Aceituno Martinique, MD;  [facility];  Service: Cardiovascular;  Laterality: N/A;   TONSILLECTOMY AND ADENOIDECTOMY  1944    Social History   Tobacco Use  Smoking Status Former Smoker   Years: 3.00   Types: Cigarettes   Quit date: 06/15/1955   Years since quitting: 64.4  Smokeless Tobacco Never Used    Social History   Substance  and Sexual Activity  Alcohol Use No    Family History  Problem Relation Age of Onset   Heart attack Mother    Stroke Father    Hypertension Father    Kidney failure Father     Review of Systems: As noted in history of present illness.  All other systems were reviewed and are negative.  Physical Exam: BP 132/82    Pulse 73    Ht 5\' 7"  (1.702 m)    Wt 172 lb (78 kg)    BMI 26.94 kg/m  GENERAL:  Well appearing elderly WM in NAD HEENT:  PERRL, EOMI, sclera are clear. Oropharynx is clear. NECK:  No jugular venous distention, carotid upstroke brisk and symmetric, no bruits, no thyromegaly or adenopathy LUNGS:  Clear to auscultation bilaterally CHEST:  Unremarkable HEART:  IRRR,  PMI not displaced or sustained,S1 and S2 within normal limits, no S3, no S4: no clicks, no rubs, no murmurs ABD:  Soft, nontender. BS +, no masses or bruits. No hepatomegaly, no splenomegaly EXT:  2 + pulses throughout, no edema, left leg in  brace. SKIN:  Warm and dry.  No rashes NEURO:  Alert and oriented x 3. Cranial nerves II through XII intact. PSYCH:  Cognitively intact     LABORATORY DATA:   Lab Results  Component Value Date   WBC 7.0 05/21/2019   HGB 16.8 05/21/2019   HCT 50.3 05/21/2019   PLT 204 05/21/2019   GLUCOSE 89 05/21/2019   CHOL 142 05/21/2019   TRIG 105 05/21/2019   HDL 46 05/21/2019   LDLCALC 77 05/21/2019   ALT 15 05/21/2019   AST 22 05/21/2019   NA 144 05/21/2019   K 4.3 05/21/2019   CL 103 05/21/2019   CREATININE 1.27 05/21/2019   BUN 14 05/21/2019   CO2 21 05/21/2019   TSH 1.161 05/24/2018   INR 1.29 05/21/2018   HGBA1C 5.6 07/13/2017   Ecg today shows AFib with rate 73. LAD. Low voltage, old AS MI. I have personally reviewed and interpreted this study.   Echo 07/12/17:Study Conclusions  - Left ventricle: The cavity size was normal. Systolic function was   normal. The estimated ejection fraction was in the range of 55%   to 60%. Wall motion was normal; there were no regional wall   motion abnormalities. Features are consistent with a pseudonormal   left ventricular filling pattern, with concomitant abnormal   relaxation and increased filling pressure (grade 2 diastolic   dysfunction). - Aortic valve: Transvalvular velocity was within the normal range.   There was no stenosis. There was no regurgitation. - Aorta: Ascending aortic diameter: 38 mm (S). - Ascending aorta: The ascending aorta was mildly dilated. - Mitral valve: Prior procedures included surgical repair.   Transvalvular velocity was within the normal range. There was no   evidence for stenosis. There was mild regurgitation. Valve area   by pressure half-time: 2.29 cm^2. - Left atrium: The atrium was moderately dilated. - Right ventricle: The cavity size was normal. Wall thickness was   normal. Systolic function was normal. - Atrial septum: No defect or patent foramen ovale was identified. - Tricuspid valve: There  was mild regurgitation. - Pulmonary arteries: Systolic pressure was severely increased. PA   peak pressure: 56 mm Hg (S).    Assessment / Plan: 1.  CVA recurrent embolic right MCA. Remote TEE documented a left atrial appendage thrombus despite prior ligation of the atrial appendage. He had been on chronic anticoagulation previously with  coumadin. Now on Eliquis   2. Coronary disease status post CABG in 2005. He also had a Maze procedure at that time.  Cardiac cath in August 2016 showed all grafts were patent except SVG to the RCA that was occluded. He underwent successful stenting of the proximal RCA with DES. He remains  asymptomatic.   3. Atrial fibrillation-persistent. status post Maze procedure. Will continue metoprolol and Eliquis.  Now still in Afib but rate well controlled on metoprolol.   4. Hypercholesterolemia. On chronic Crestor. At goal.  5. HTN. Blood pressure is well controlled  FU 6 months. Check lab work including CBC, CMET, lipids then.

## 2019-11-13 DIAGNOSIS — I69354 Hemiplegia and hemiparesis following cerebral infarction affecting left non-dominant side: Secondary | ICD-10-CM | POA: Diagnosis not present

## 2019-11-13 DIAGNOSIS — Z951 Presence of aortocoronary bypass graft: Secondary | ICD-10-CM | POA: Diagnosis not present

## 2019-11-13 DIAGNOSIS — E785 Hyperlipidemia, unspecified: Secondary | ICD-10-CM | POA: Diagnosis not present

## 2019-11-13 DIAGNOSIS — I10 Essential (primary) hypertension: Secondary | ICD-10-CM | POA: Diagnosis not present

## 2019-11-13 DIAGNOSIS — I4891 Unspecified atrial fibrillation: Secondary | ICD-10-CM | POA: Diagnosis not present

## 2019-11-13 DIAGNOSIS — I2510 Atherosclerotic heart disease of native coronary artery without angina pectoris: Secondary | ICD-10-CM | POA: Diagnosis not present

## 2019-11-15 DIAGNOSIS — I69354 Hemiplegia and hemiparesis following cerebral infarction affecting left non-dominant side: Secondary | ICD-10-CM | POA: Diagnosis not present

## 2019-11-15 DIAGNOSIS — I10 Essential (primary) hypertension: Secondary | ICD-10-CM | POA: Diagnosis not present

## 2019-11-15 DIAGNOSIS — I2510 Atherosclerotic heart disease of native coronary artery without angina pectoris: Secondary | ICD-10-CM | POA: Diagnosis not present

## 2019-11-15 DIAGNOSIS — I4891 Unspecified atrial fibrillation: Secondary | ICD-10-CM | POA: Diagnosis not present

## 2019-11-15 DIAGNOSIS — Z951 Presence of aortocoronary bypass graft: Secondary | ICD-10-CM | POA: Diagnosis not present

## 2019-11-15 DIAGNOSIS — E785 Hyperlipidemia, unspecified: Secondary | ICD-10-CM | POA: Diagnosis not present

## 2019-11-16 ENCOUNTER — Ambulatory Visit (INDEPENDENT_AMBULATORY_CARE_PROVIDER_SITE_OTHER): Payer: Medicare Other | Admitting: Cardiology

## 2019-11-16 ENCOUNTER — Other Ambulatory Visit: Payer: Self-pay

## 2019-11-16 ENCOUNTER — Encounter: Payer: Self-pay | Admitting: Cardiology

## 2019-11-16 VITALS — BP 132/82 | HR 73 | Ht 67.0 in | Wt 172.0 lb

## 2019-11-16 DIAGNOSIS — E7800 Pure hypercholesterolemia, unspecified: Secondary | ICD-10-CM | POA: Diagnosis not present

## 2019-11-16 DIAGNOSIS — I4811 Longstanding persistent atrial fibrillation: Secondary | ICD-10-CM | POA: Diagnosis not present

## 2019-11-16 DIAGNOSIS — I25810 Atherosclerosis of coronary artery bypass graft(s) without angina pectoris: Secondary | ICD-10-CM | POA: Diagnosis not present

## 2019-11-16 DIAGNOSIS — I341 Nonrheumatic mitral (valve) prolapse: Secondary | ICD-10-CM | POA: Diagnosis not present

## 2019-11-16 DIAGNOSIS — I10 Essential (primary) hypertension: Secondary | ICD-10-CM | POA: Diagnosis not present

## 2019-11-19 DIAGNOSIS — Z951 Presence of aortocoronary bypass graft: Secondary | ICD-10-CM | POA: Diagnosis not present

## 2019-11-19 DIAGNOSIS — I4891 Unspecified atrial fibrillation: Secondary | ICD-10-CM | POA: Diagnosis not present

## 2019-11-19 DIAGNOSIS — I69354 Hemiplegia and hemiparesis following cerebral infarction affecting left non-dominant side: Secondary | ICD-10-CM | POA: Diagnosis not present

## 2019-11-19 DIAGNOSIS — I10 Essential (primary) hypertension: Secondary | ICD-10-CM | POA: Diagnosis not present

## 2019-11-19 DIAGNOSIS — I2510 Atherosclerotic heart disease of native coronary artery without angina pectoris: Secondary | ICD-10-CM | POA: Diagnosis not present

## 2019-11-19 DIAGNOSIS — E785 Hyperlipidemia, unspecified: Secondary | ICD-10-CM | POA: Diagnosis not present

## 2019-11-23 DIAGNOSIS — I10 Essential (primary) hypertension: Secondary | ICD-10-CM | POA: Diagnosis not present

## 2019-11-23 DIAGNOSIS — I69354 Hemiplegia and hemiparesis following cerebral infarction affecting left non-dominant side: Secondary | ICD-10-CM | POA: Diagnosis not present

## 2019-11-23 DIAGNOSIS — I2510 Atherosclerotic heart disease of native coronary artery without angina pectoris: Secondary | ICD-10-CM | POA: Diagnosis not present

## 2019-11-23 DIAGNOSIS — E785 Hyperlipidemia, unspecified: Secondary | ICD-10-CM | POA: Diagnosis not present

## 2019-11-23 DIAGNOSIS — I4891 Unspecified atrial fibrillation: Secondary | ICD-10-CM | POA: Diagnosis not present

## 2019-11-23 DIAGNOSIS — Z951 Presence of aortocoronary bypass graft: Secondary | ICD-10-CM | POA: Diagnosis not present

## 2019-11-25 DIAGNOSIS — I4891 Unspecified atrial fibrillation: Secondary | ICD-10-CM | POA: Diagnosis not present

## 2019-11-25 DIAGNOSIS — Z7901 Long term (current) use of anticoagulants: Secondary | ICD-10-CM | POA: Diagnosis not present

## 2019-11-25 DIAGNOSIS — Z9181 History of falling: Secondary | ICD-10-CM | POA: Diagnosis not present

## 2019-11-25 DIAGNOSIS — Z951 Presence of aortocoronary bypass graft: Secondary | ICD-10-CM | POA: Diagnosis not present

## 2019-11-25 DIAGNOSIS — Z8673 Personal history of transient ischemic attack (TIA), and cerebral infarction without residual deficits: Secondary | ICD-10-CM | POA: Diagnosis not present

## 2019-11-25 DIAGNOSIS — Z87891 Personal history of nicotine dependence: Secondary | ICD-10-CM | POA: Diagnosis not present

## 2019-11-25 DIAGNOSIS — I10 Essential (primary) hypertension: Secondary | ICD-10-CM | POA: Diagnosis not present

## 2019-11-25 DIAGNOSIS — I2510 Atherosclerotic heart disease of native coronary artery without angina pectoris: Secondary | ICD-10-CM | POA: Diagnosis not present

## 2019-11-25 DIAGNOSIS — Z993 Dependence on wheelchair: Secondary | ICD-10-CM | POA: Diagnosis not present

## 2019-11-25 DIAGNOSIS — Z952 Presence of prosthetic heart valve: Secondary | ICD-10-CM | POA: Diagnosis not present

## 2019-11-25 DIAGNOSIS — Z87442 Personal history of urinary calculi: Secondary | ICD-10-CM | POA: Diagnosis not present

## 2019-11-25 DIAGNOSIS — I69354 Hemiplegia and hemiparesis following cerebral infarction affecting left non-dominant side: Secondary | ICD-10-CM | POA: Diagnosis not present

## 2019-11-25 DIAGNOSIS — E785 Hyperlipidemia, unspecified: Secondary | ICD-10-CM | POA: Diagnosis not present

## 2019-11-26 DIAGNOSIS — I69354 Hemiplegia and hemiparesis following cerebral infarction affecting left non-dominant side: Secondary | ICD-10-CM | POA: Diagnosis not present

## 2019-11-26 DIAGNOSIS — I10 Essential (primary) hypertension: Secondary | ICD-10-CM | POA: Diagnosis not present

## 2019-11-26 DIAGNOSIS — Z951 Presence of aortocoronary bypass graft: Secondary | ICD-10-CM | POA: Diagnosis not present

## 2019-11-26 DIAGNOSIS — I2510 Atherosclerotic heart disease of native coronary artery without angina pectoris: Secondary | ICD-10-CM | POA: Diagnosis not present

## 2019-11-26 DIAGNOSIS — E785 Hyperlipidemia, unspecified: Secondary | ICD-10-CM | POA: Diagnosis not present

## 2019-11-26 DIAGNOSIS — I4891 Unspecified atrial fibrillation: Secondary | ICD-10-CM | POA: Diagnosis not present

## 2019-11-28 DIAGNOSIS — Z951 Presence of aortocoronary bypass graft: Secondary | ICD-10-CM | POA: Diagnosis not present

## 2019-11-28 DIAGNOSIS — I10 Essential (primary) hypertension: Secondary | ICD-10-CM | POA: Diagnosis not present

## 2019-11-28 DIAGNOSIS — E785 Hyperlipidemia, unspecified: Secondary | ICD-10-CM | POA: Diagnosis not present

## 2019-11-28 DIAGNOSIS — I4891 Unspecified atrial fibrillation: Secondary | ICD-10-CM | POA: Diagnosis not present

## 2019-11-28 DIAGNOSIS — I2510 Atherosclerotic heart disease of native coronary artery without angina pectoris: Secondary | ICD-10-CM | POA: Diagnosis not present

## 2019-11-28 DIAGNOSIS — I69354 Hemiplegia and hemiparesis following cerebral infarction affecting left non-dominant side: Secondary | ICD-10-CM | POA: Diagnosis not present

## 2019-12-02 IMAGING — CT CT HEAD W/O CM
4 series · 15 of 47 positions shown, 17 images · non-contrast
Comparison: 07/11/2017 CT head and CT angiogram head.

CLINICAL DATA: 88 y/o M; post intra-arterial intervention for
stroke.

EXAM:
CT HEAD WITHOUT CONTRAST
TECHNIQUE: Contiguous axial images were obtained from the base of the skull
through the vertex without intravenous contrast.

[Series 3: head without · axial · non-contrast · 0.45mm/px · z∈[-144,-24]mm · 7 of 34 slices shown, 9 images]
[im 5/34  brain]
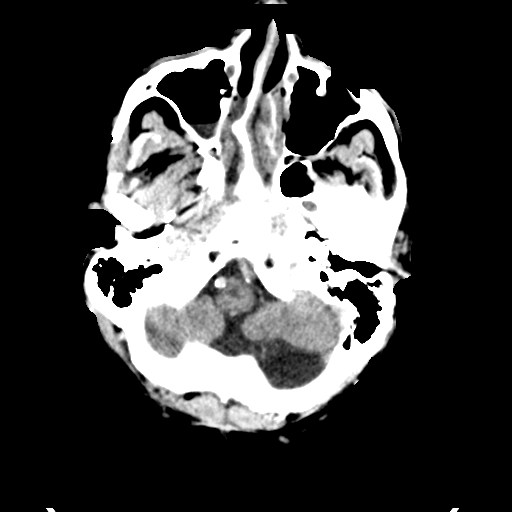
[im 5/34  bone]
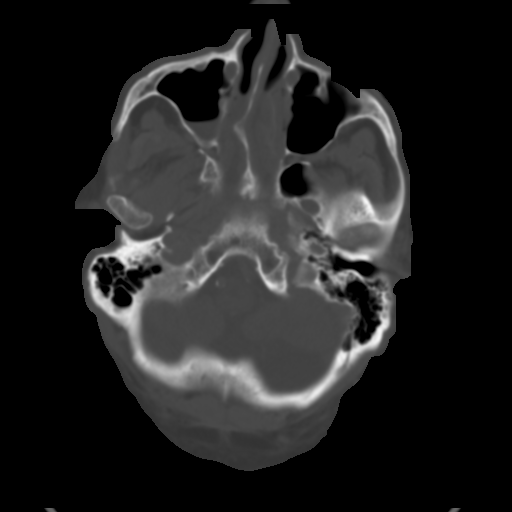
[im 9/34  brain]
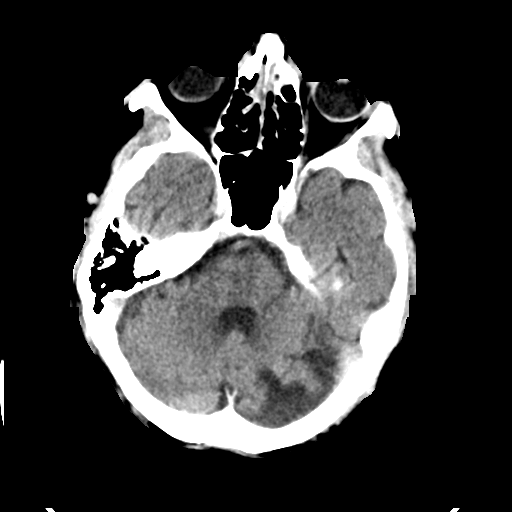
[im 13/34  brain]
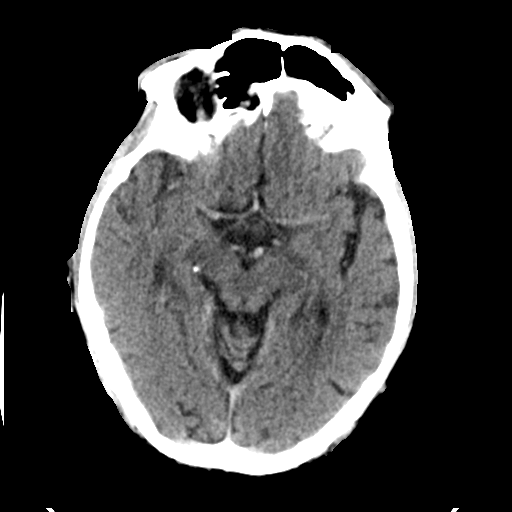
[im 17/34  brain]
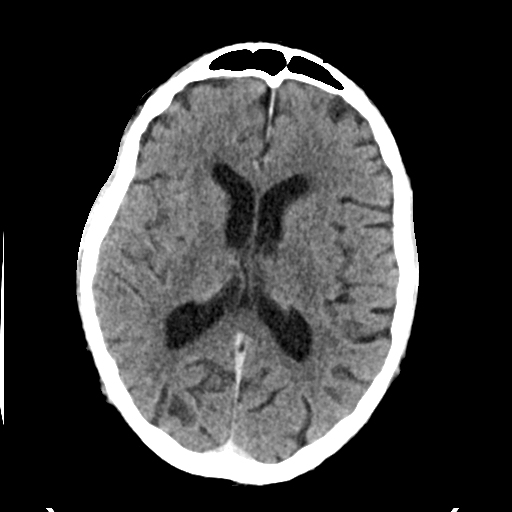
[im 21/34  brain]
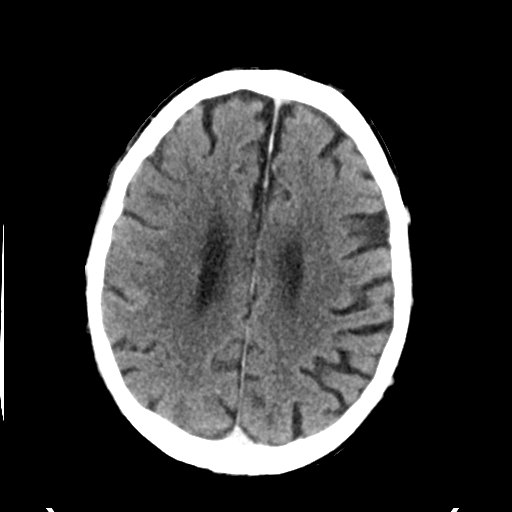
[im 21/34  bone]
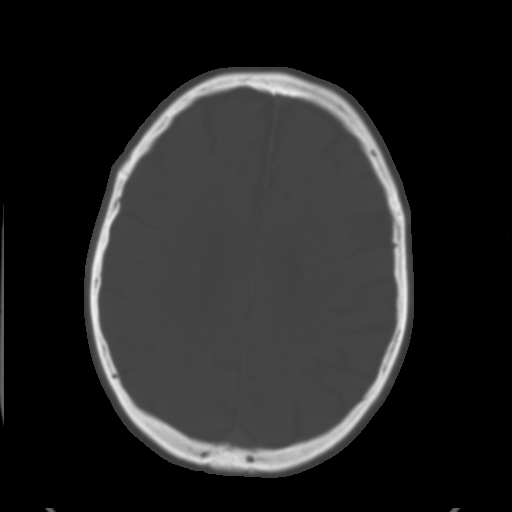
[im 25/34  brain]
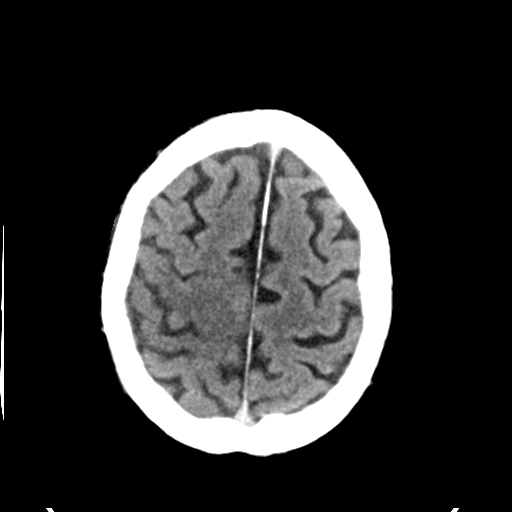
[im 29/34  brain]
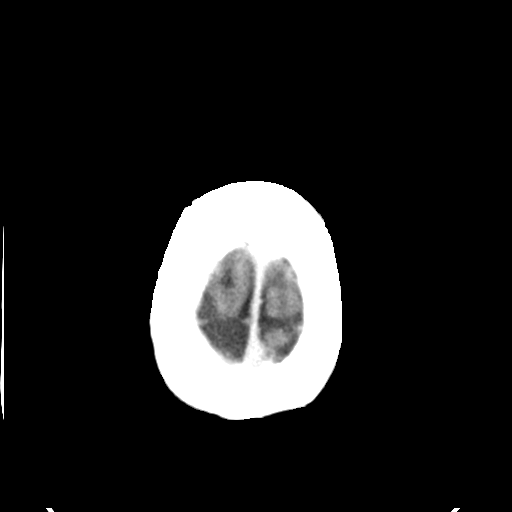

[Series 4: head bone · axial · 0.45mm/px · z∈[-148,-132]mm · 2 of 85 slices shown]
[im 9/85  bone]
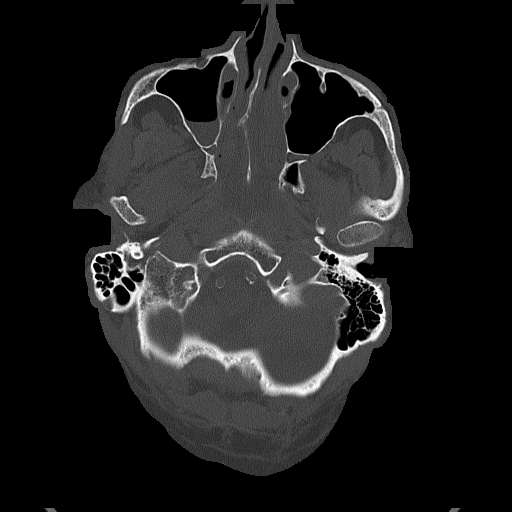
[im 17/85  bone]
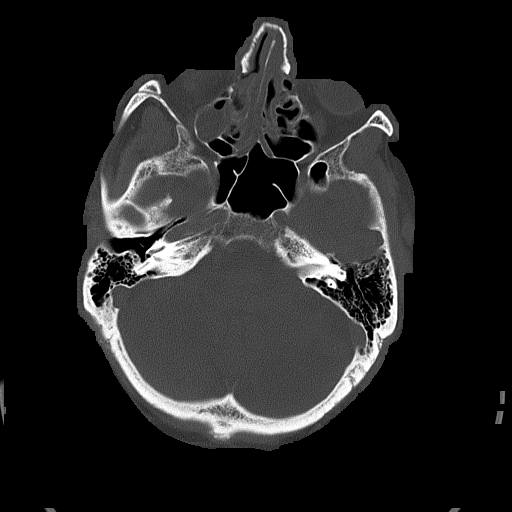

[Series 5: head without cor · coronal · non-contrast · 0.32mm/px · 3 of 72 slices shown]
[im 24/72  brain]
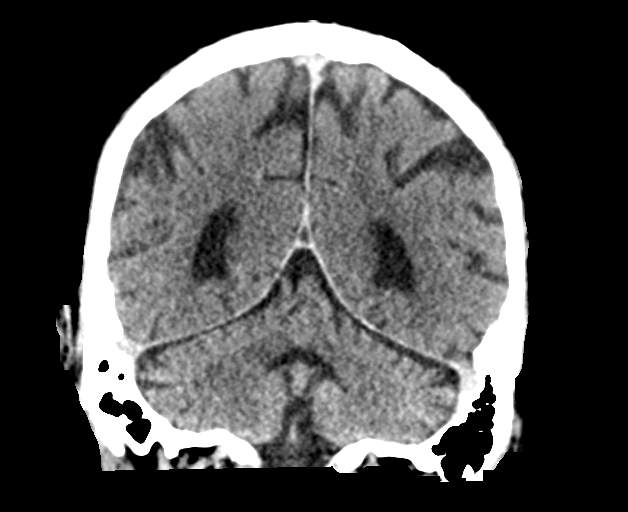
[im 32/72  brain]
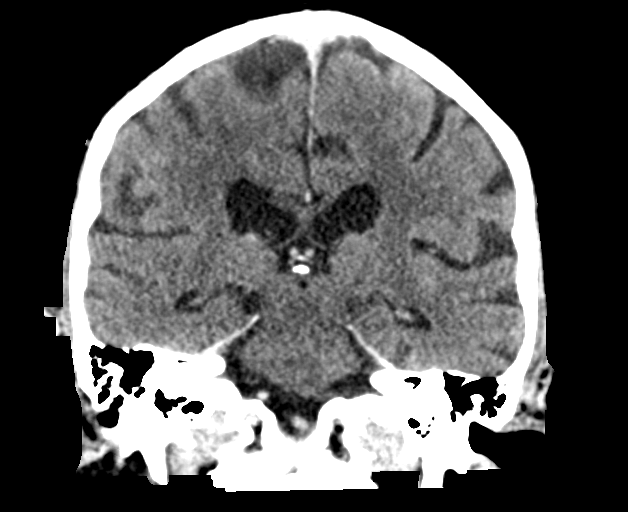
[im 40/72  brain]
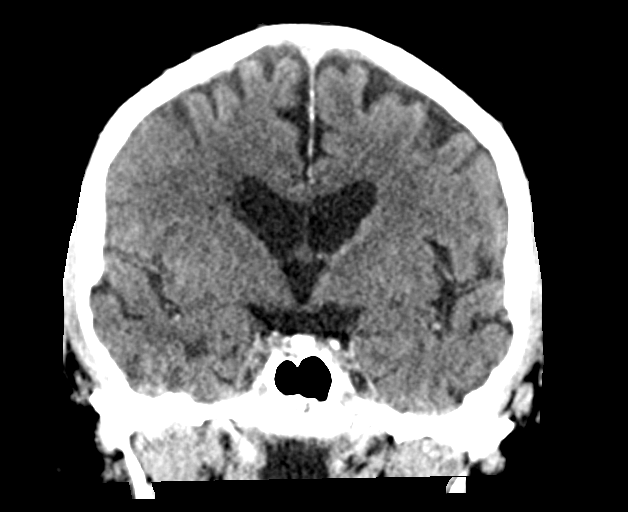

[Series 6: head without sag · sagittal · non-contrast · 0.31mm/px · 3 of 62 slices shown]
[im 21/62  brain]
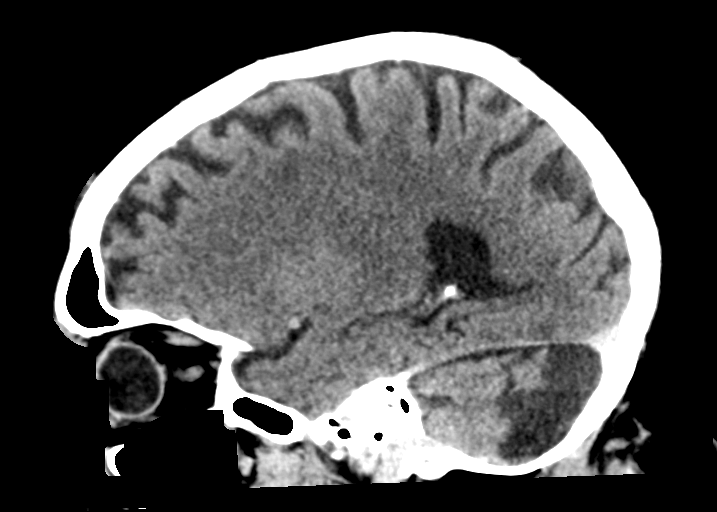
[im 31/62  brain]
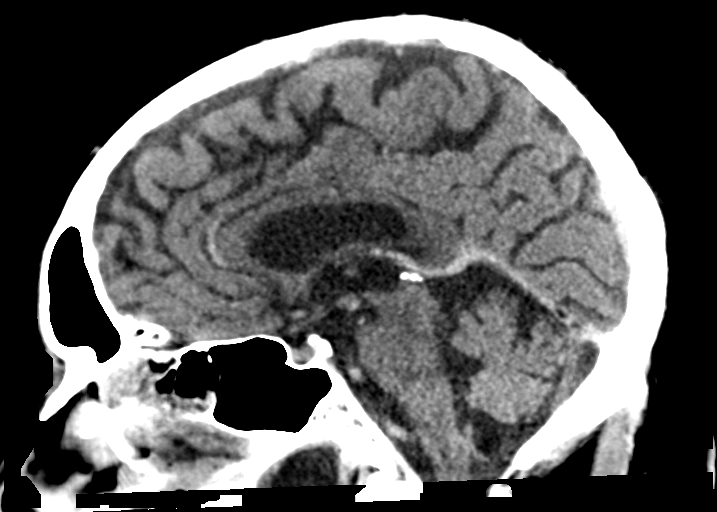
[im 41/62  brain]
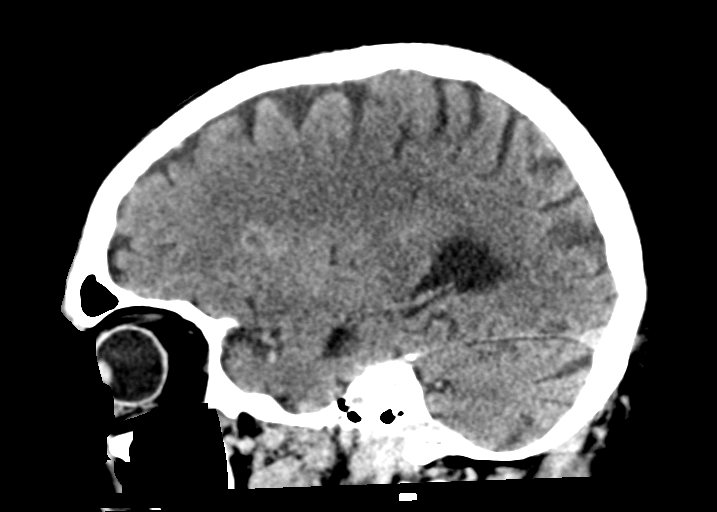

[15 of 47 positions shown; findings below may reference images not displayed]

FINDINGS: Brain: Interval loss of gray-white differentiation in the right
perisylvian frontal and parietal lobes (series 3, image 26 and
series 6, image 21) likely representing developing acute infarction.
No hemorrhage.

Stable small chronic cortical infarcts in the frontal and right
parietal lobes, small chronic infarct in left inferior cerebellar
hemisphere, mild chronic microvascular ischemic changes, and
parenchymal volume loss of the brain.

Vascular: Contrast is present within the vascular structures.

Skull: Normal. Negative for fracture or focal lesion.

Sinuses/Orbits: Persistent partial opacification of left anterior
ethmoid and frontal sinuses. Fluid level in the right maxillary
sinus. Normal aeration of mastoid air cells.

Other: None.
IMPRESSION: 1. Interval loss of gray-white differentiation in the right
perisylvian frontal and parietal lobes likely representing a
developing acute infarction. No hemorrhage.
2. Stable background of chronic microvascular ischemic changes and
parenchymal volume loss of the brain.

By: Deanne Tiger M.D.

## 2019-12-03 DIAGNOSIS — I2510 Atherosclerotic heart disease of native coronary artery without angina pectoris: Secondary | ICD-10-CM | POA: Diagnosis not present

## 2019-12-03 DIAGNOSIS — I4891 Unspecified atrial fibrillation: Secondary | ICD-10-CM | POA: Diagnosis not present

## 2019-12-03 DIAGNOSIS — E785 Hyperlipidemia, unspecified: Secondary | ICD-10-CM | POA: Diagnosis not present

## 2019-12-03 DIAGNOSIS — I10 Essential (primary) hypertension: Secondary | ICD-10-CM | POA: Diagnosis not present

## 2019-12-03 DIAGNOSIS — Z951 Presence of aortocoronary bypass graft: Secondary | ICD-10-CM | POA: Diagnosis not present

## 2019-12-03 DIAGNOSIS — I69354 Hemiplegia and hemiparesis following cerebral infarction affecting left non-dominant side: Secondary | ICD-10-CM | POA: Diagnosis not present

## 2019-12-11 DIAGNOSIS — Z951 Presence of aortocoronary bypass graft: Secondary | ICD-10-CM | POA: Diagnosis not present

## 2019-12-11 DIAGNOSIS — I10 Essential (primary) hypertension: Secondary | ICD-10-CM | POA: Diagnosis not present

## 2019-12-11 DIAGNOSIS — I69354 Hemiplegia and hemiparesis following cerebral infarction affecting left non-dominant side: Secondary | ICD-10-CM | POA: Diagnosis not present

## 2019-12-11 DIAGNOSIS — I2510 Atherosclerotic heart disease of native coronary artery without angina pectoris: Secondary | ICD-10-CM | POA: Diagnosis not present

## 2019-12-11 DIAGNOSIS — I4891 Unspecified atrial fibrillation: Secondary | ICD-10-CM | POA: Diagnosis not present

## 2019-12-11 DIAGNOSIS — E785 Hyperlipidemia, unspecified: Secondary | ICD-10-CM | POA: Diagnosis not present

## 2019-12-13 IMAGING — RF DG SWALLOWING FUNCTION - NRPT [facility]
1 series · 18 of 24 positions shown · non-contrast
Comparison: none

[Series 1: run · 24 acquisitions, 18 frames shown]
[im 1/24]
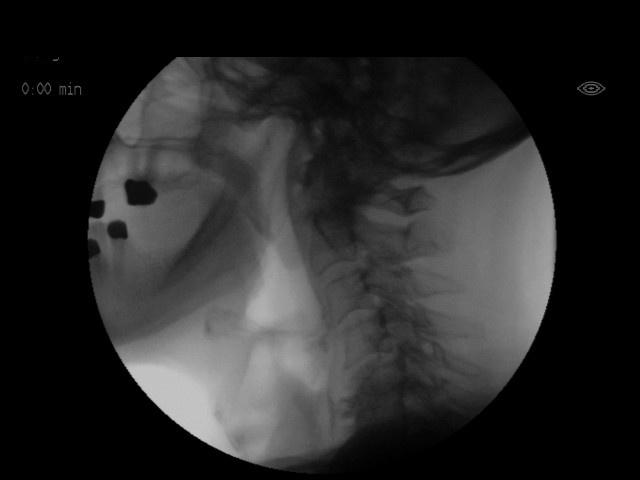
[im 3/24]
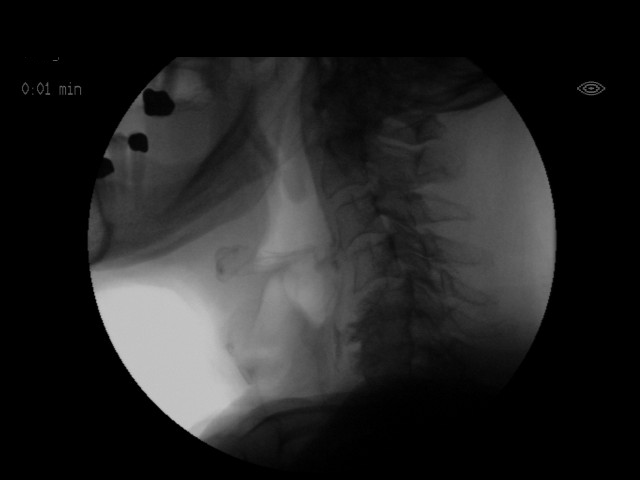
[im 4/24]
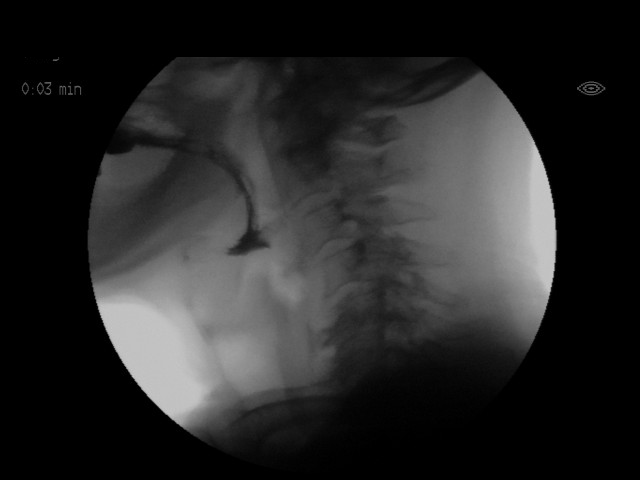
[im 5/24]
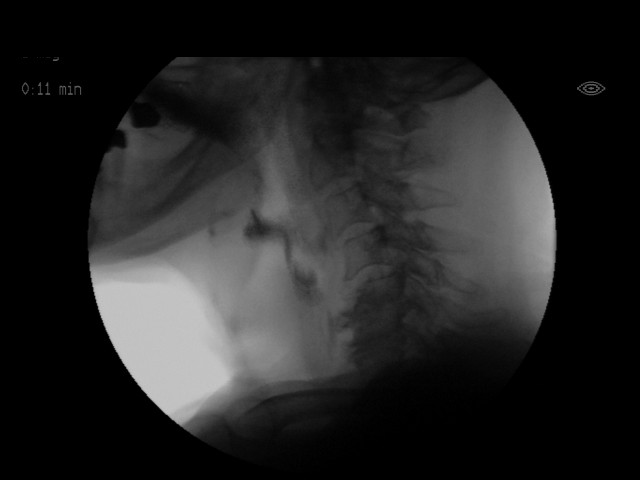
[im 7/24]
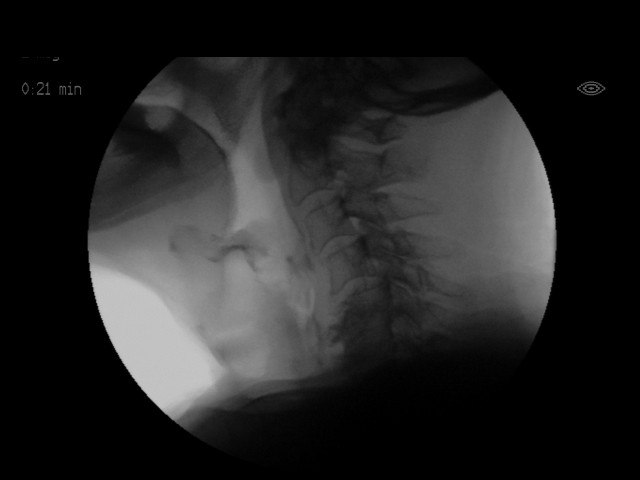
[im 8/24]
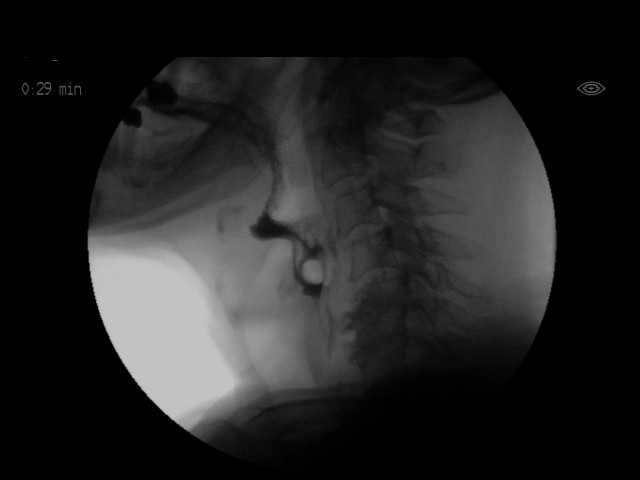
[im 9/24]
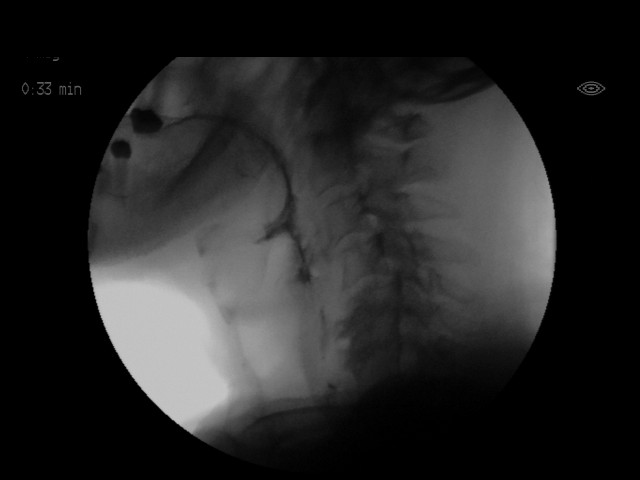
[im 11/24]
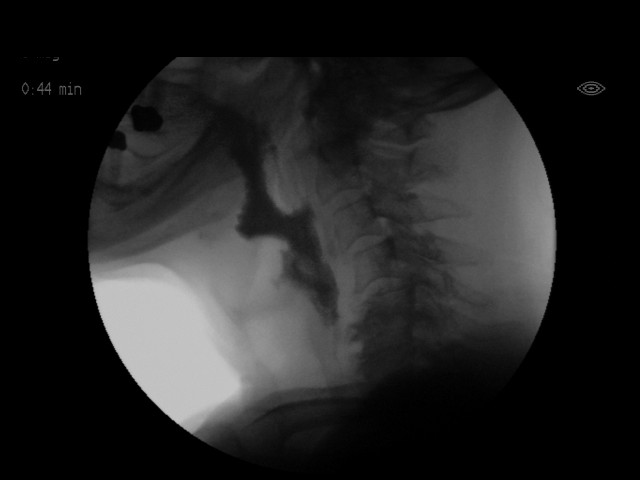
[im 12/24]
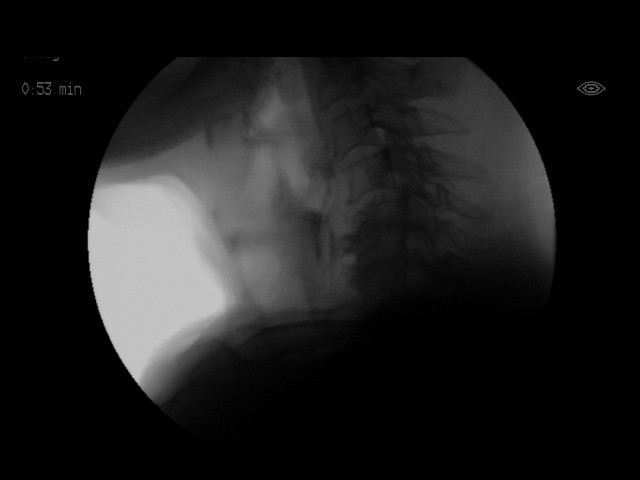
[im 13/24]
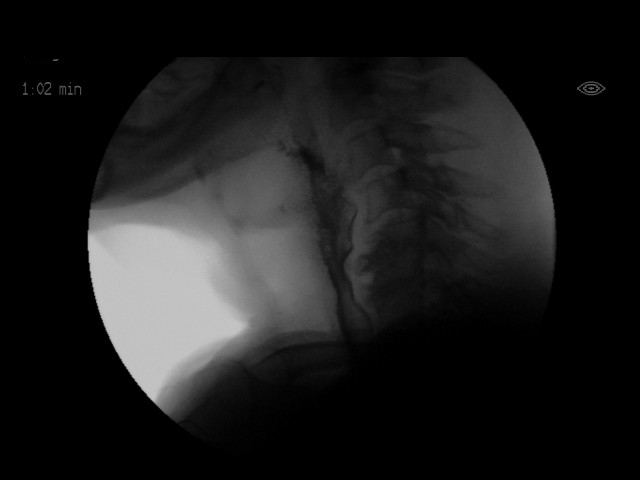
[im 15/24]
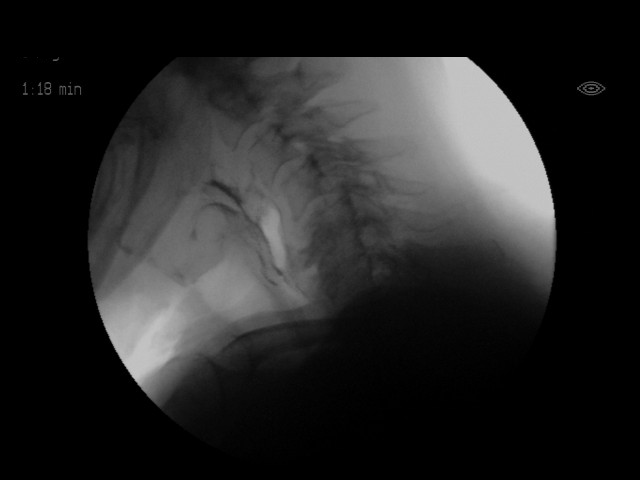
[im 16/24]
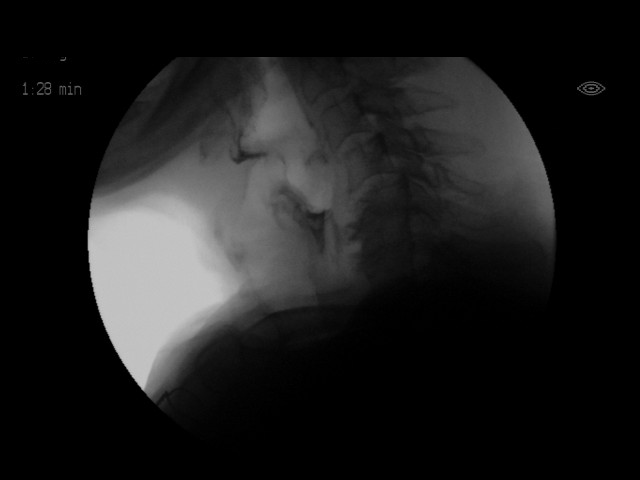
[im 17/24]
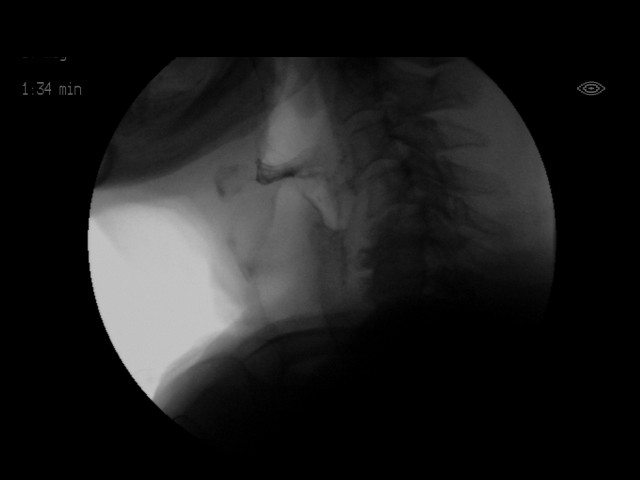
[im 19/24]
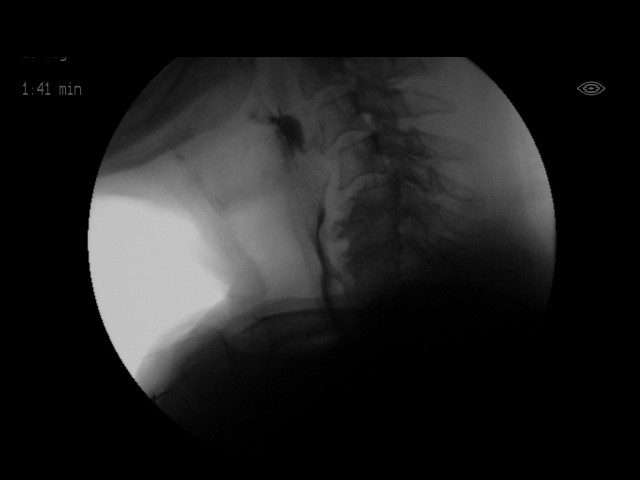
[im 20/24]
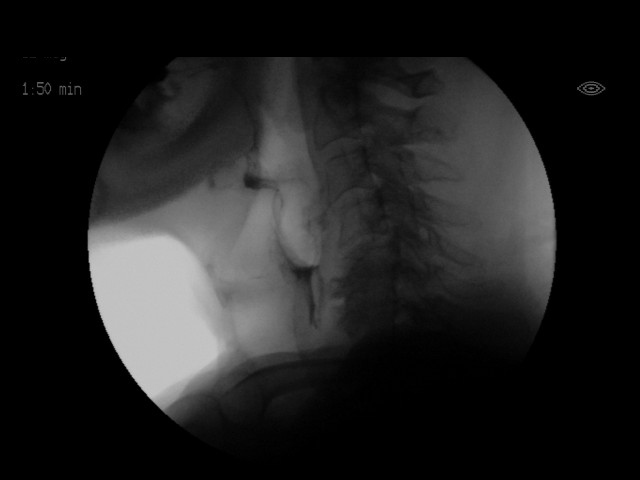
[im 21/24]
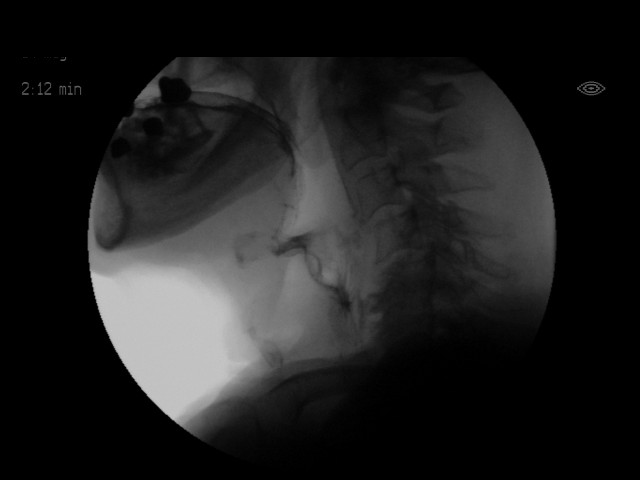
[im 23/24]
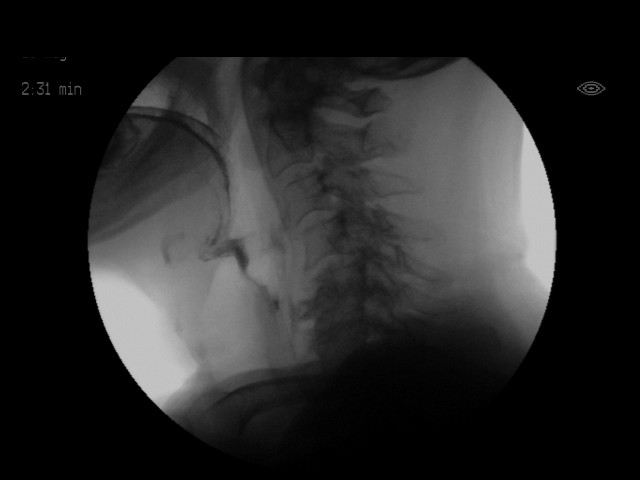
[im 24/24]
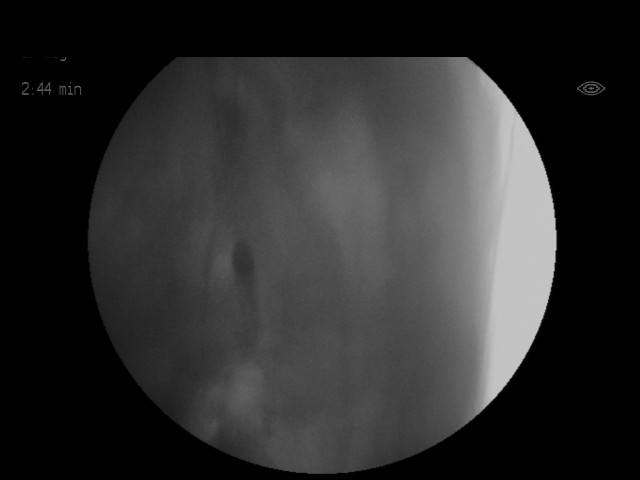

[18 of 24 positions shown; findings below may reference images not displayed]

FLUOROSCOPY FOR SWALLOWING FUNCTION STUDY:
Fluoroscopy was provided for swallowing function study, which was administered by a speech pathologist.  Final results and recommendations from this study are contained within the speech pathology report.

## 2019-12-20 DIAGNOSIS — I10 Essential (primary) hypertension: Secondary | ICD-10-CM | POA: Diagnosis not present

## 2019-12-20 DIAGNOSIS — I69354 Hemiplegia and hemiparesis following cerebral infarction affecting left non-dominant side: Secondary | ICD-10-CM | POA: Diagnosis not present

## 2019-12-20 DIAGNOSIS — I4891 Unspecified atrial fibrillation: Secondary | ICD-10-CM | POA: Diagnosis not present

## 2019-12-20 DIAGNOSIS — E785 Hyperlipidemia, unspecified: Secondary | ICD-10-CM | POA: Diagnosis not present

## 2019-12-20 DIAGNOSIS — I2510 Atherosclerotic heart disease of native coronary artery without angina pectoris: Secondary | ICD-10-CM | POA: Diagnosis not present

## 2019-12-20 DIAGNOSIS — Z951 Presence of aortocoronary bypass graft: Secondary | ICD-10-CM | POA: Diagnosis not present

## 2019-12-24 DIAGNOSIS — I69354 Hemiplegia and hemiparesis following cerebral infarction affecting left non-dominant side: Secondary | ICD-10-CM | POA: Diagnosis not present

## 2019-12-24 DIAGNOSIS — I4891 Unspecified atrial fibrillation: Secondary | ICD-10-CM | POA: Diagnosis not present

## 2019-12-24 DIAGNOSIS — Z951 Presence of aortocoronary bypass graft: Secondary | ICD-10-CM | POA: Diagnosis not present

## 2019-12-24 DIAGNOSIS — I10 Essential (primary) hypertension: Secondary | ICD-10-CM | POA: Diagnosis not present

## 2019-12-24 DIAGNOSIS — I2510 Atherosclerotic heart disease of native coronary artery without angina pectoris: Secondary | ICD-10-CM | POA: Diagnosis not present

## 2019-12-24 DIAGNOSIS — E785 Hyperlipidemia, unspecified: Secondary | ICD-10-CM | POA: Diagnosis not present

## 2019-12-25 DIAGNOSIS — Z8673 Personal history of transient ischemic attack (TIA), and cerebral infarction without residual deficits: Secondary | ICD-10-CM | POA: Diagnosis not present

## 2019-12-25 DIAGNOSIS — Z86010 Personal history of colonic polyps: Secondary | ICD-10-CM | POA: Diagnosis not present

## 2019-12-25 DIAGNOSIS — Z993 Dependence on wheelchair: Secondary | ICD-10-CM | POA: Diagnosis not present

## 2019-12-25 DIAGNOSIS — I2510 Atherosclerotic heart disease of native coronary artery without angina pectoris: Secondary | ICD-10-CM | POA: Diagnosis not present

## 2019-12-25 DIAGNOSIS — I69354 Hemiplegia and hemiparesis following cerebral infarction affecting left non-dominant side: Secondary | ICD-10-CM | POA: Diagnosis not present

## 2019-12-25 DIAGNOSIS — I10 Essential (primary) hypertension: Secondary | ICD-10-CM | POA: Diagnosis not present

## 2019-12-25 DIAGNOSIS — Z87891 Personal history of nicotine dependence: Secondary | ICD-10-CM | POA: Diagnosis not present

## 2019-12-25 DIAGNOSIS — Z7901 Long term (current) use of anticoagulants: Secondary | ICD-10-CM | POA: Diagnosis not present

## 2019-12-25 DIAGNOSIS — Z952 Presence of prosthetic heart valve: Secondary | ICD-10-CM | POA: Diagnosis not present

## 2019-12-25 DIAGNOSIS — I4891 Unspecified atrial fibrillation: Secondary | ICD-10-CM | POA: Diagnosis not present

## 2019-12-25 DIAGNOSIS — E785 Hyperlipidemia, unspecified: Secondary | ICD-10-CM | POA: Diagnosis not present

## 2019-12-25 DIAGNOSIS — Z951 Presence of aortocoronary bypass graft: Secondary | ICD-10-CM | POA: Diagnosis not present

## 2019-12-25 DIAGNOSIS — Z9181 History of falling: Secondary | ICD-10-CM | POA: Diagnosis not present

## 2019-12-25 DIAGNOSIS — Z87442 Personal history of urinary calculi: Secondary | ICD-10-CM | POA: Diagnosis not present

## 2020-01-03 DIAGNOSIS — I2510 Atherosclerotic heart disease of native coronary artery without angina pectoris: Secondary | ICD-10-CM | POA: Diagnosis not present

## 2020-01-03 DIAGNOSIS — I4891 Unspecified atrial fibrillation: Secondary | ICD-10-CM | POA: Diagnosis not present

## 2020-01-03 DIAGNOSIS — I69354 Hemiplegia and hemiparesis following cerebral infarction affecting left non-dominant side: Secondary | ICD-10-CM | POA: Diagnosis not present

## 2020-01-03 DIAGNOSIS — E785 Hyperlipidemia, unspecified: Secondary | ICD-10-CM | POA: Diagnosis not present

## 2020-01-03 DIAGNOSIS — I10 Essential (primary) hypertension: Secondary | ICD-10-CM | POA: Diagnosis not present

## 2020-01-03 DIAGNOSIS — Z7901 Long term (current) use of anticoagulants: Secondary | ICD-10-CM | POA: Diagnosis not present

## 2020-01-09 DIAGNOSIS — I69354 Hemiplegia and hemiparesis following cerebral infarction affecting left non-dominant side: Secondary | ICD-10-CM | POA: Diagnosis not present

## 2020-01-09 DIAGNOSIS — Z7901 Long term (current) use of anticoagulants: Secondary | ICD-10-CM | POA: Diagnosis not present

## 2020-01-09 DIAGNOSIS — I10 Essential (primary) hypertension: Secondary | ICD-10-CM | POA: Diagnosis not present

## 2020-01-09 DIAGNOSIS — I2510 Atherosclerotic heart disease of native coronary artery without angina pectoris: Secondary | ICD-10-CM | POA: Diagnosis not present

## 2020-01-09 DIAGNOSIS — I4891 Unspecified atrial fibrillation: Secondary | ICD-10-CM | POA: Diagnosis not present

## 2020-01-09 DIAGNOSIS — E785 Hyperlipidemia, unspecified: Secondary | ICD-10-CM | POA: Diagnosis not present

## 2020-01-15 DIAGNOSIS — I2510 Atherosclerotic heart disease of native coronary artery without angina pectoris: Secondary | ICD-10-CM | POA: Diagnosis not present

## 2020-01-15 DIAGNOSIS — Z7901 Long term (current) use of anticoagulants: Secondary | ICD-10-CM | POA: Diagnosis not present

## 2020-01-15 DIAGNOSIS — I69354 Hemiplegia and hemiparesis following cerebral infarction affecting left non-dominant side: Secondary | ICD-10-CM | POA: Diagnosis not present

## 2020-01-15 DIAGNOSIS — E785 Hyperlipidemia, unspecified: Secondary | ICD-10-CM | POA: Diagnosis not present

## 2020-01-15 DIAGNOSIS — I10 Essential (primary) hypertension: Secondary | ICD-10-CM | POA: Diagnosis not present

## 2020-01-15 DIAGNOSIS — I4891 Unspecified atrial fibrillation: Secondary | ICD-10-CM | POA: Diagnosis not present

## 2020-01-21 DIAGNOSIS — E785 Hyperlipidemia, unspecified: Secondary | ICD-10-CM | POA: Diagnosis not present

## 2020-01-21 DIAGNOSIS — I4891 Unspecified atrial fibrillation: Secondary | ICD-10-CM | POA: Diagnosis not present

## 2020-01-21 DIAGNOSIS — I10 Essential (primary) hypertension: Secondary | ICD-10-CM | POA: Diagnosis not present

## 2020-01-21 DIAGNOSIS — I69354 Hemiplegia and hemiparesis following cerebral infarction affecting left non-dominant side: Secondary | ICD-10-CM | POA: Diagnosis not present

## 2020-01-21 DIAGNOSIS — I2510 Atherosclerotic heart disease of native coronary artery without angina pectoris: Secondary | ICD-10-CM | POA: Diagnosis not present

## 2020-01-21 DIAGNOSIS — Z7901 Long term (current) use of anticoagulants: Secondary | ICD-10-CM | POA: Diagnosis not present

## 2020-01-23 DIAGNOSIS — D225 Melanocytic nevi of trunk: Secondary | ICD-10-CM | POA: Diagnosis not present

## 2020-01-23 DIAGNOSIS — D485 Neoplasm of uncertain behavior of skin: Secondary | ICD-10-CM | POA: Diagnosis not present

## 2020-01-23 DIAGNOSIS — Z08 Encounter for follow-up examination after completed treatment for malignant neoplasm: Secondary | ICD-10-CM | POA: Diagnosis not present

## 2020-01-23 DIAGNOSIS — Z85828 Personal history of other malignant neoplasm of skin: Secondary | ICD-10-CM | POA: Diagnosis not present

## 2020-01-24 DIAGNOSIS — Z951 Presence of aortocoronary bypass graft: Secondary | ICD-10-CM | POA: Diagnosis not present

## 2020-01-24 DIAGNOSIS — Z87891 Personal history of nicotine dependence: Secondary | ICD-10-CM | POA: Diagnosis not present

## 2020-01-24 DIAGNOSIS — I4891 Unspecified atrial fibrillation: Secondary | ICD-10-CM | POA: Diagnosis not present

## 2020-01-24 DIAGNOSIS — Z993 Dependence on wheelchair: Secondary | ICD-10-CM | POA: Diagnosis not present

## 2020-01-24 DIAGNOSIS — Z952 Presence of prosthetic heart valve: Secondary | ICD-10-CM | POA: Diagnosis not present

## 2020-01-24 DIAGNOSIS — E785 Hyperlipidemia, unspecified: Secondary | ICD-10-CM | POA: Diagnosis not present

## 2020-01-24 DIAGNOSIS — Z7901 Long term (current) use of anticoagulants: Secondary | ICD-10-CM | POA: Diagnosis not present

## 2020-01-24 DIAGNOSIS — Z87442 Personal history of urinary calculi: Secondary | ICD-10-CM | POA: Diagnosis not present

## 2020-01-24 DIAGNOSIS — Z86010 Personal history of colonic polyps: Secondary | ICD-10-CM | POA: Diagnosis not present

## 2020-01-24 DIAGNOSIS — I69354 Hemiplegia and hemiparesis following cerebral infarction affecting left non-dominant side: Secondary | ICD-10-CM | POA: Diagnosis not present

## 2020-01-24 DIAGNOSIS — I10 Essential (primary) hypertension: Secondary | ICD-10-CM | POA: Diagnosis not present

## 2020-01-24 DIAGNOSIS — I2510 Atherosclerotic heart disease of native coronary artery without angina pectoris: Secondary | ICD-10-CM | POA: Diagnosis not present

## 2020-01-24 DIAGNOSIS — Z8673 Personal history of transient ischemic attack (TIA), and cerebral infarction without residual deficits: Secondary | ICD-10-CM | POA: Diagnosis not present

## 2020-01-24 DIAGNOSIS — Z9181 History of falling: Secondary | ICD-10-CM | POA: Diagnosis not present

## 2020-01-29 DIAGNOSIS — I10 Essential (primary) hypertension: Secondary | ICD-10-CM | POA: Diagnosis not present

## 2020-01-29 DIAGNOSIS — I2510 Atherosclerotic heart disease of native coronary artery without angina pectoris: Secondary | ICD-10-CM | POA: Diagnosis not present

## 2020-01-29 DIAGNOSIS — I4891 Unspecified atrial fibrillation: Secondary | ICD-10-CM | POA: Diagnosis not present

## 2020-01-29 DIAGNOSIS — Z7901 Long term (current) use of anticoagulants: Secondary | ICD-10-CM | POA: Diagnosis not present

## 2020-01-29 DIAGNOSIS — I69354 Hemiplegia and hemiparesis following cerebral infarction affecting left non-dominant side: Secondary | ICD-10-CM | POA: Diagnosis not present

## 2020-01-29 DIAGNOSIS — E785 Hyperlipidemia, unspecified: Secondary | ICD-10-CM | POA: Diagnosis not present

## 2020-02-05 DIAGNOSIS — I69354 Hemiplegia and hemiparesis following cerebral infarction affecting left non-dominant side: Secondary | ICD-10-CM | POA: Diagnosis not present

## 2020-02-05 DIAGNOSIS — I4891 Unspecified atrial fibrillation: Secondary | ICD-10-CM | POA: Diagnosis not present

## 2020-02-05 DIAGNOSIS — E785 Hyperlipidemia, unspecified: Secondary | ICD-10-CM | POA: Diagnosis not present

## 2020-02-05 DIAGNOSIS — I10 Essential (primary) hypertension: Secondary | ICD-10-CM | POA: Diagnosis not present

## 2020-02-05 DIAGNOSIS — I2510 Atherosclerotic heart disease of native coronary artery without angina pectoris: Secondary | ICD-10-CM | POA: Diagnosis not present

## 2020-02-05 DIAGNOSIS — Z7901 Long term (current) use of anticoagulants: Secondary | ICD-10-CM | POA: Diagnosis not present

## 2020-02-12 DIAGNOSIS — I69354 Hemiplegia and hemiparesis following cerebral infarction affecting left non-dominant side: Secondary | ICD-10-CM | POA: Diagnosis not present

## 2020-02-12 DIAGNOSIS — I2510 Atherosclerotic heart disease of native coronary artery without angina pectoris: Secondary | ICD-10-CM | POA: Diagnosis not present

## 2020-02-12 DIAGNOSIS — I10 Essential (primary) hypertension: Secondary | ICD-10-CM | POA: Diagnosis not present

## 2020-02-12 DIAGNOSIS — Z7901 Long term (current) use of anticoagulants: Secondary | ICD-10-CM | POA: Diagnosis not present

## 2020-02-12 DIAGNOSIS — E785 Hyperlipidemia, unspecified: Secondary | ICD-10-CM | POA: Diagnosis not present

## 2020-02-12 DIAGNOSIS — I4891 Unspecified atrial fibrillation: Secondary | ICD-10-CM | POA: Diagnosis not present

## 2020-02-20 DIAGNOSIS — I4891 Unspecified atrial fibrillation: Secondary | ICD-10-CM | POA: Diagnosis not present

## 2020-02-20 DIAGNOSIS — Z7901 Long term (current) use of anticoagulants: Secondary | ICD-10-CM | POA: Diagnosis not present

## 2020-02-20 DIAGNOSIS — I69354 Hemiplegia and hemiparesis following cerebral infarction affecting left non-dominant side: Secondary | ICD-10-CM | POA: Diagnosis not present

## 2020-02-20 DIAGNOSIS — I10 Essential (primary) hypertension: Secondary | ICD-10-CM | POA: Diagnosis not present

## 2020-02-20 DIAGNOSIS — E785 Hyperlipidemia, unspecified: Secondary | ICD-10-CM | POA: Diagnosis not present

## 2020-02-20 DIAGNOSIS — I2510 Atherosclerotic heart disease of native coronary artery without angina pectoris: Secondary | ICD-10-CM | POA: Diagnosis not present

## 2020-02-23 DIAGNOSIS — Z9181 History of falling: Secondary | ICD-10-CM | POA: Diagnosis not present

## 2020-02-23 DIAGNOSIS — Z7901 Long term (current) use of anticoagulants: Secondary | ICD-10-CM | POA: Diagnosis not present

## 2020-02-23 DIAGNOSIS — Z86010 Personal history of colonic polyps: Secondary | ICD-10-CM | POA: Diagnosis not present

## 2020-02-23 DIAGNOSIS — I69354 Hemiplegia and hemiparesis following cerebral infarction affecting left non-dominant side: Secondary | ICD-10-CM | POA: Diagnosis not present

## 2020-02-23 DIAGNOSIS — Z993 Dependence on wheelchair: Secondary | ICD-10-CM | POA: Diagnosis not present

## 2020-02-23 DIAGNOSIS — Z87891 Personal history of nicotine dependence: Secondary | ICD-10-CM | POA: Diagnosis not present

## 2020-02-23 DIAGNOSIS — I4891 Unspecified atrial fibrillation: Secondary | ICD-10-CM | POA: Diagnosis not present

## 2020-02-23 DIAGNOSIS — Z7951 Long term (current) use of inhaled steroids: Secondary | ICD-10-CM | POA: Diagnosis not present

## 2020-02-23 DIAGNOSIS — Z951 Presence of aortocoronary bypass graft: Secondary | ICD-10-CM | POA: Diagnosis not present

## 2020-02-23 DIAGNOSIS — Z952 Presence of prosthetic heart valve: Secondary | ICD-10-CM | POA: Diagnosis not present

## 2020-02-23 DIAGNOSIS — I10 Essential (primary) hypertension: Secondary | ICD-10-CM | POA: Diagnosis not present

## 2020-02-23 DIAGNOSIS — E785 Hyperlipidemia, unspecified: Secondary | ICD-10-CM | POA: Diagnosis not present

## 2020-02-23 DIAGNOSIS — Z8673 Personal history of transient ischemic attack (TIA), and cerebral infarction without residual deficits: Secondary | ICD-10-CM | POA: Diagnosis not present

## 2020-02-23 DIAGNOSIS — I2510 Atherosclerotic heart disease of native coronary artery without angina pectoris: Secondary | ICD-10-CM | POA: Diagnosis not present

## 2020-02-23 DIAGNOSIS — Z87442 Personal history of urinary calculi: Secondary | ICD-10-CM | POA: Diagnosis not present

## 2020-02-26 DIAGNOSIS — I10 Essential (primary) hypertension: Secondary | ICD-10-CM | POA: Diagnosis not present

## 2020-02-26 DIAGNOSIS — I4891 Unspecified atrial fibrillation: Secondary | ICD-10-CM | POA: Diagnosis not present

## 2020-02-26 DIAGNOSIS — E785 Hyperlipidemia, unspecified: Secondary | ICD-10-CM | POA: Diagnosis not present

## 2020-02-26 DIAGNOSIS — I69354 Hemiplegia and hemiparesis following cerebral infarction affecting left non-dominant side: Secondary | ICD-10-CM | POA: Diagnosis not present

## 2020-02-26 DIAGNOSIS — Z7901 Long term (current) use of anticoagulants: Secondary | ICD-10-CM | POA: Diagnosis not present

## 2020-02-26 DIAGNOSIS — I2510 Atherosclerotic heart disease of native coronary artery without angina pectoris: Secondary | ICD-10-CM | POA: Diagnosis not present

## 2020-02-27 ENCOUNTER — Other Ambulatory Visit: Payer: Self-pay | Admitting: Cardiology

## 2020-02-28 DIAGNOSIS — I4891 Unspecified atrial fibrillation: Secondary | ICD-10-CM | POA: Diagnosis not present

## 2020-02-28 DIAGNOSIS — E785 Hyperlipidemia, unspecified: Secondary | ICD-10-CM | POA: Diagnosis not present

## 2020-02-28 DIAGNOSIS — Z7901 Long term (current) use of anticoagulants: Secondary | ICD-10-CM | POA: Diagnosis not present

## 2020-02-28 DIAGNOSIS — I69354 Hemiplegia and hemiparesis following cerebral infarction affecting left non-dominant side: Secondary | ICD-10-CM | POA: Diagnosis not present

## 2020-02-28 DIAGNOSIS — I2510 Atherosclerotic heart disease of native coronary artery without angina pectoris: Secondary | ICD-10-CM | POA: Diagnosis not present

## 2020-02-28 DIAGNOSIS — I10 Essential (primary) hypertension: Secondary | ICD-10-CM | POA: Diagnosis not present

## 2020-03-03 DIAGNOSIS — I10 Essential (primary) hypertension: Secondary | ICD-10-CM | POA: Diagnosis not present

## 2020-03-03 DIAGNOSIS — E785 Hyperlipidemia, unspecified: Secondary | ICD-10-CM | POA: Diagnosis not present

## 2020-03-03 DIAGNOSIS — I2510 Atherosclerotic heart disease of native coronary artery without angina pectoris: Secondary | ICD-10-CM | POA: Diagnosis not present

## 2020-03-03 DIAGNOSIS — I69354 Hemiplegia and hemiparesis following cerebral infarction affecting left non-dominant side: Secondary | ICD-10-CM | POA: Diagnosis not present

## 2020-03-03 DIAGNOSIS — Z7901 Long term (current) use of anticoagulants: Secondary | ICD-10-CM | POA: Diagnosis not present

## 2020-03-03 DIAGNOSIS — I4891 Unspecified atrial fibrillation: Secondary | ICD-10-CM | POA: Diagnosis not present

## 2020-03-05 DIAGNOSIS — Z7901 Long term (current) use of anticoagulants: Secondary | ICD-10-CM | POA: Diagnosis not present

## 2020-03-05 DIAGNOSIS — E785 Hyperlipidemia, unspecified: Secondary | ICD-10-CM | POA: Diagnosis not present

## 2020-03-05 DIAGNOSIS — I2510 Atherosclerotic heart disease of native coronary artery without angina pectoris: Secondary | ICD-10-CM | POA: Diagnosis not present

## 2020-03-05 DIAGNOSIS — I10 Essential (primary) hypertension: Secondary | ICD-10-CM | POA: Diagnosis not present

## 2020-03-05 DIAGNOSIS — I69354 Hemiplegia and hemiparesis following cerebral infarction affecting left non-dominant side: Secondary | ICD-10-CM | POA: Diagnosis not present

## 2020-03-05 DIAGNOSIS — I4891 Unspecified atrial fibrillation: Secondary | ICD-10-CM | POA: Diagnosis not present

## 2020-03-10 DIAGNOSIS — Z7901 Long term (current) use of anticoagulants: Secondary | ICD-10-CM | POA: Diagnosis not present

## 2020-03-10 DIAGNOSIS — I4891 Unspecified atrial fibrillation: Secondary | ICD-10-CM | POA: Diagnosis not present

## 2020-03-10 DIAGNOSIS — E785 Hyperlipidemia, unspecified: Secondary | ICD-10-CM | POA: Diagnosis not present

## 2020-03-10 DIAGNOSIS — I2510 Atherosclerotic heart disease of native coronary artery without angina pectoris: Secondary | ICD-10-CM | POA: Diagnosis not present

## 2020-03-10 DIAGNOSIS — I69354 Hemiplegia and hemiparesis following cerebral infarction affecting left non-dominant side: Secondary | ICD-10-CM | POA: Diagnosis not present

## 2020-03-10 DIAGNOSIS — I10 Essential (primary) hypertension: Secondary | ICD-10-CM | POA: Diagnosis not present

## 2020-03-11 DIAGNOSIS — C44619 Basal cell carcinoma of skin of left upper limb, including shoulder: Secondary | ICD-10-CM | POA: Diagnosis not present

## 2020-03-12 DIAGNOSIS — I10 Essential (primary) hypertension: Secondary | ICD-10-CM | POA: Diagnosis not present

## 2020-03-12 DIAGNOSIS — I4891 Unspecified atrial fibrillation: Secondary | ICD-10-CM | POA: Diagnosis not present

## 2020-03-12 DIAGNOSIS — E785 Hyperlipidemia, unspecified: Secondary | ICD-10-CM | POA: Diagnosis not present

## 2020-03-12 DIAGNOSIS — I2510 Atherosclerotic heart disease of native coronary artery without angina pectoris: Secondary | ICD-10-CM | POA: Diagnosis not present

## 2020-03-12 DIAGNOSIS — Z7901 Long term (current) use of anticoagulants: Secondary | ICD-10-CM | POA: Diagnosis not present

## 2020-03-12 DIAGNOSIS — I69354 Hemiplegia and hemiparesis following cerebral infarction affecting left non-dominant side: Secondary | ICD-10-CM | POA: Diagnosis not present

## 2020-03-13 DIAGNOSIS — G8114 Spastic hemiplegia affecting left nondominant side: Secondary | ICD-10-CM | POA: Diagnosis not present

## 2020-03-13 DIAGNOSIS — Z0000 Encounter for general adult medical examination without abnormal findings: Secondary | ICD-10-CM | POA: Diagnosis not present

## 2020-03-13 DIAGNOSIS — D6869 Other thrombophilia: Secondary | ICD-10-CM | POA: Diagnosis not present

## 2020-03-13 DIAGNOSIS — Z125 Encounter for screening for malignant neoplasm of prostate: Secondary | ICD-10-CM | POA: Diagnosis not present

## 2020-03-13 DIAGNOSIS — I10 Essential (primary) hypertension: Secondary | ICD-10-CM | POA: Diagnosis not present

## 2020-03-13 DIAGNOSIS — E782 Mixed hyperlipidemia: Secondary | ICD-10-CM | POA: Diagnosis not present

## 2020-03-13 DIAGNOSIS — I4891 Unspecified atrial fibrillation: Secondary | ICD-10-CM | POA: Diagnosis not present

## 2020-03-13 DIAGNOSIS — N1831 Chronic kidney disease, stage 3a: Secondary | ICD-10-CM | POA: Diagnosis not present

## 2020-03-17 DIAGNOSIS — I10 Essential (primary) hypertension: Secondary | ICD-10-CM | POA: Diagnosis not present

## 2020-03-17 DIAGNOSIS — I4891 Unspecified atrial fibrillation: Secondary | ICD-10-CM | POA: Diagnosis not present

## 2020-03-17 DIAGNOSIS — E785 Hyperlipidemia, unspecified: Secondary | ICD-10-CM | POA: Diagnosis not present

## 2020-03-17 DIAGNOSIS — I2510 Atherosclerotic heart disease of native coronary artery without angina pectoris: Secondary | ICD-10-CM | POA: Diagnosis not present

## 2020-03-17 DIAGNOSIS — I69354 Hemiplegia and hemiparesis following cerebral infarction affecting left non-dominant side: Secondary | ICD-10-CM | POA: Diagnosis not present

## 2020-03-17 DIAGNOSIS — Z7901 Long term (current) use of anticoagulants: Secondary | ICD-10-CM | POA: Diagnosis not present

## 2020-03-19 DIAGNOSIS — E785 Hyperlipidemia, unspecified: Secondary | ICD-10-CM | POA: Diagnosis not present

## 2020-03-19 DIAGNOSIS — I69354 Hemiplegia and hemiparesis following cerebral infarction affecting left non-dominant side: Secondary | ICD-10-CM | POA: Diagnosis not present

## 2020-03-19 DIAGNOSIS — I10 Essential (primary) hypertension: Secondary | ICD-10-CM | POA: Diagnosis not present

## 2020-03-19 DIAGNOSIS — I2510 Atherosclerotic heart disease of native coronary artery without angina pectoris: Secondary | ICD-10-CM | POA: Diagnosis not present

## 2020-03-19 DIAGNOSIS — I4891 Unspecified atrial fibrillation: Secondary | ICD-10-CM | POA: Diagnosis not present

## 2020-03-19 DIAGNOSIS — Z7901 Long term (current) use of anticoagulants: Secondary | ICD-10-CM | POA: Diagnosis not present

## 2020-03-21 DIAGNOSIS — I739 Peripheral vascular disease, unspecified: Secondary | ICD-10-CM | POA: Diagnosis not present

## 2020-03-21 DIAGNOSIS — L603 Nail dystrophy: Secondary | ICD-10-CM | POA: Diagnosis not present

## 2020-03-21 DIAGNOSIS — L600 Ingrowing nail: Secondary | ICD-10-CM | POA: Diagnosis not present

## 2020-03-21 DIAGNOSIS — B351 Tinea unguium: Secondary | ICD-10-CM | POA: Diagnosis not present

## 2020-03-24 DIAGNOSIS — Z87891 Personal history of nicotine dependence: Secondary | ICD-10-CM | POA: Diagnosis not present

## 2020-03-24 DIAGNOSIS — Z86010 Personal history of colonic polyps: Secondary | ICD-10-CM | POA: Diagnosis not present

## 2020-03-24 DIAGNOSIS — I10 Essential (primary) hypertension: Secondary | ICD-10-CM | POA: Diagnosis not present

## 2020-03-24 DIAGNOSIS — Z7951 Long term (current) use of inhaled steroids: Secondary | ICD-10-CM | POA: Diagnosis not present

## 2020-03-24 DIAGNOSIS — Z952 Presence of prosthetic heart valve: Secondary | ICD-10-CM | POA: Diagnosis not present

## 2020-03-24 DIAGNOSIS — I2510 Atherosclerotic heart disease of native coronary artery without angina pectoris: Secondary | ICD-10-CM | POA: Diagnosis not present

## 2020-03-24 DIAGNOSIS — E785 Hyperlipidemia, unspecified: Secondary | ICD-10-CM | POA: Diagnosis not present

## 2020-03-24 DIAGNOSIS — Z7901 Long term (current) use of anticoagulants: Secondary | ICD-10-CM | POA: Diagnosis not present

## 2020-03-24 DIAGNOSIS — Z9181 History of falling: Secondary | ICD-10-CM | POA: Diagnosis not present

## 2020-03-24 DIAGNOSIS — Z993 Dependence on wheelchair: Secondary | ICD-10-CM | POA: Diagnosis not present

## 2020-03-24 DIAGNOSIS — I4891 Unspecified atrial fibrillation: Secondary | ICD-10-CM | POA: Diagnosis not present

## 2020-03-24 DIAGNOSIS — Z951 Presence of aortocoronary bypass graft: Secondary | ICD-10-CM | POA: Diagnosis not present

## 2020-03-24 DIAGNOSIS — I69354 Hemiplegia and hemiparesis following cerebral infarction affecting left non-dominant side: Secondary | ICD-10-CM | POA: Diagnosis not present

## 2020-03-25 DIAGNOSIS — I10 Essential (primary) hypertension: Secondary | ICD-10-CM | POA: Diagnosis not present

## 2020-03-25 DIAGNOSIS — I69354 Hemiplegia and hemiparesis following cerebral infarction affecting left non-dominant side: Secondary | ICD-10-CM | POA: Diagnosis not present

## 2020-03-25 DIAGNOSIS — I2510 Atherosclerotic heart disease of native coronary artery without angina pectoris: Secondary | ICD-10-CM | POA: Diagnosis not present

## 2020-03-25 DIAGNOSIS — E785 Hyperlipidemia, unspecified: Secondary | ICD-10-CM | POA: Diagnosis not present

## 2020-03-25 DIAGNOSIS — I4891 Unspecified atrial fibrillation: Secondary | ICD-10-CM | POA: Diagnosis not present

## 2020-03-25 DIAGNOSIS — Z7901 Long term (current) use of anticoagulants: Secondary | ICD-10-CM | POA: Diagnosis not present

## 2020-04-01 DIAGNOSIS — I2510 Atherosclerotic heart disease of native coronary artery without angina pectoris: Secondary | ICD-10-CM | POA: Diagnosis not present

## 2020-04-01 DIAGNOSIS — I69354 Hemiplegia and hemiparesis following cerebral infarction affecting left non-dominant side: Secondary | ICD-10-CM | POA: Diagnosis not present

## 2020-04-01 DIAGNOSIS — I10 Essential (primary) hypertension: Secondary | ICD-10-CM | POA: Diagnosis not present

## 2020-04-01 DIAGNOSIS — I4891 Unspecified atrial fibrillation: Secondary | ICD-10-CM | POA: Diagnosis not present

## 2020-04-01 DIAGNOSIS — E785 Hyperlipidemia, unspecified: Secondary | ICD-10-CM | POA: Diagnosis not present

## 2020-04-01 DIAGNOSIS — Z7901 Long term (current) use of anticoagulants: Secondary | ICD-10-CM | POA: Diagnosis not present

## 2020-04-10 DIAGNOSIS — I69354 Hemiplegia and hemiparesis following cerebral infarction affecting left non-dominant side: Secondary | ICD-10-CM | POA: Diagnosis not present

## 2020-04-10 DIAGNOSIS — E785 Hyperlipidemia, unspecified: Secondary | ICD-10-CM | POA: Diagnosis not present

## 2020-04-10 DIAGNOSIS — Z7901 Long term (current) use of anticoagulants: Secondary | ICD-10-CM | POA: Diagnosis not present

## 2020-04-10 DIAGNOSIS — I10 Essential (primary) hypertension: Secondary | ICD-10-CM | POA: Diagnosis not present

## 2020-04-10 DIAGNOSIS — I2510 Atherosclerotic heart disease of native coronary artery without angina pectoris: Secondary | ICD-10-CM | POA: Diagnosis not present

## 2020-04-10 DIAGNOSIS — I4891 Unspecified atrial fibrillation: Secondary | ICD-10-CM | POA: Diagnosis not present

## 2020-04-15 DIAGNOSIS — I2510 Atherosclerotic heart disease of native coronary artery without angina pectoris: Secondary | ICD-10-CM | POA: Diagnosis not present

## 2020-04-15 DIAGNOSIS — I10 Essential (primary) hypertension: Secondary | ICD-10-CM | POA: Diagnosis not present

## 2020-04-15 DIAGNOSIS — Z7901 Long term (current) use of anticoagulants: Secondary | ICD-10-CM | POA: Diagnosis not present

## 2020-04-15 DIAGNOSIS — I69354 Hemiplegia and hemiparesis following cerebral infarction affecting left non-dominant side: Secondary | ICD-10-CM | POA: Diagnosis not present

## 2020-04-15 DIAGNOSIS — I4891 Unspecified atrial fibrillation: Secondary | ICD-10-CM | POA: Diagnosis not present

## 2020-04-15 DIAGNOSIS — E785 Hyperlipidemia, unspecified: Secondary | ICD-10-CM | POA: Diagnosis not present

## 2020-04-21 DIAGNOSIS — I2510 Atherosclerotic heart disease of native coronary artery without angina pectoris: Secondary | ICD-10-CM | POA: Diagnosis not present

## 2020-04-21 DIAGNOSIS — I69354 Hemiplegia and hemiparesis following cerebral infarction affecting left non-dominant side: Secondary | ICD-10-CM | POA: Diagnosis not present

## 2020-04-21 DIAGNOSIS — I4891 Unspecified atrial fibrillation: Secondary | ICD-10-CM | POA: Diagnosis not present

## 2020-04-21 DIAGNOSIS — E785 Hyperlipidemia, unspecified: Secondary | ICD-10-CM | POA: Diagnosis not present

## 2020-04-21 DIAGNOSIS — I10 Essential (primary) hypertension: Secondary | ICD-10-CM | POA: Diagnosis not present

## 2020-04-21 DIAGNOSIS — Z7901 Long term (current) use of anticoagulants: Secondary | ICD-10-CM | POA: Diagnosis not present

## 2020-04-23 DIAGNOSIS — I4891 Unspecified atrial fibrillation: Secondary | ICD-10-CM | POA: Diagnosis not present

## 2020-04-23 DIAGNOSIS — R059 Cough, unspecified: Secondary | ICD-10-CM | POA: Diagnosis not present

## 2020-04-23 DIAGNOSIS — Z993 Dependence on wheelchair: Secondary | ICD-10-CM | POA: Diagnosis not present

## 2020-04-23 DIAGNOSIS — Z87891 Personal history of nicotine dependence: Secondary | ICD-10-CM | POA: Diagnosis not present

## 2020-04-23 DIAGNOSIS — Z951 Presence of aortocoronary bypass graft: Secondary | ICD-10-CM | POA: Diagnosis not present

## 2020-04-23 DIAGNOSIS — E785 Hyperlipidemia, unspecified: Secondary | ICD-10-CM | POA: Diagnosis not present

## 2020-04-23 DIAGNOSIS — Z7901 Long term (current) use of anticoagulants: Secondary | ICD-10-CM | POA: Diagnosis not present

## 2020-04-23 DIAGNOSIS — Z86010 Personal history of colonic polyps: Secondary | ICD-10-CM | POA: Diagnosis not present

## 2020-04-23 DIAGNOSIS — Z9181 History of falling: Secondary | ICD-10-CM | POA: Diagnosis not present

## 2020-04-23 DIAGNOSIS — I69354 Hemiplegia and hemiparesis following cerebral infarction affecting left non-dominant side: Secondary | ICD-10-CM | POA: Diagnosis not present

## 2020-04-23 DIAGNOSIS — Z952 Presence of prosthetic heart valve: Secondary | ICD-10-CM | POA: Diagnosis not present

## 2020-04-23 DIAGNOSIS — I2510 Atherosclerotic heart disease of native coronary artery without angina pectoris: Secondary | ICD-10-CM | POA: Diagnosis not present

## 2020-04-23 DIAGNOSIS — Z7951 Long term (current) use of inhaled steroids: Secondary | ICD-10-CM | POA: Diagnosis not present

## 2020-04-23 DIAGNOSIS — I10 Essential (primary) hypertension: Secondary | ICD-10-CM | POA: Diagnosis not present

## 2020-04-29 DIAGNOSIS — I69354 Hemiplegia and hemiparesis following cerebral infarction affecting left non-dominant side: Secondary | ICD-10-CM | POA: Diagnosis not present

## 2020-04-29 DIAGNOSIS — I2510 Atherosclerotic heart disease of native coronary artery without angina pectoris: Secondary | ICD-10-CM | POA: Diagnosis not present

## 2020-04-29 DIAGNOSIS — I4891 Unspecified atrial fibrillation: Secondary | ICD-10-CM | POA: Diagnosis not present

## 2020-04-29 DIAGNOSIS — E785 Hyperlipidemia, unspecified: Secondary | ICD-10-CM | POA: Diagnosis not present

## 2020-04-29 DIAGNOSIS — Z993 Dependence on wheelchair: Secondary | ICD-10-CM | POA: Diagnosis not present

## 2020-04-29 DIAGNOSIS — I10 Essential (primary) hypertension: Secondary | ICD-10-CM | POA: Diagnosis not present

## 2020-05-04 ENCOUNTER — Other Ambulatory Visit: Payer: Self-pay | Admitting: Cardiology

## 2020-05-07 DIAGNOSIS — I2510 Atherosclerotic heart disease of native coronary artery without angina pectoris: Secondary | ICD-10-CM | POA: Diagnosis not present

## 2020-05-07 DIAGNOSIS — I4891 Unspecified atrial fibrillation: Secondary | ICD-10-CM | POA: Diagnosis not present

## 2020-05-07 DIAGNOSIS — Z993 Dependence on wheelchair: Secondary | ICD-10-CM | POA: Diagnosis not present

## 2020-05-07 DIAGNOSIS — I10 Essential (primary) hypertension: Secondary | ICD-10-CM | POA: Diagnosis not present

## 2020-05-07 DIAGNOSIS — I69354 Hemiplegia and hemiparesis following cerebral infarction affecting left non-dominant side: Secondary | ICD-10-CM | POA: Diagnosis not present

## 2020-05-07 DIAGNOSIS — E785 Hyperlipidemia, unspecified: Secondary | ICD-10-CM | POA: Diagnosis not present

## 2020-05-12 DIAGNOSIS — I4891 Unspecified atrial fibrillation: Secondary | ICD-10-CM | POA: Diagnosis not present

## 2020-05-12 DIAGNOSIS — E785 Hyperlipidemia, unspecified: Secondary | ICD-10-CM | POA: Diagnosis not present

## 2020-05-12 DIAGNOSIS — Z993 Dependence on wheelchair: Secondary | ICD-10-CM | POA: Diagnosis not present

## 2020-05-12 DIAGNOSIS — I69354 Hemiplegia and hemiparesis following cerebral infarction affecting left non-dominant side: Secondary | ICD-10-CM | POA: Diagnosis not present

## 2020-05-12 DIAGNOSIS — I2510 Atherosclerotic heart disease of native coronary artery without angina pectoris: Secondary | ICD-10-CM | POA: Diagnosis not present

## 2020-05-12 DIAGNOSIS — I10 Essential (primary) hypertension: Secondary | ICD-10-CM | POA: Diagnosis not present

## 2020-05-14 ENCOUNTER — Other Ambulatory Visit: Payer: Self-pay | Admitting: Cardiology

## 2020-05-14 DIAGNOSIS — C44612 Basal cell carcinoma of skin of right upper limb, including shoulder: Secondary | ICD-10-CM | POA: Diagnosis not present

## 2020-05-19 DIAGNOSIS — I69354 Hemiplegia and hemiparesis following cerebral infarction affecting left non-dominant side: Secondary | ICD-10-CM | POA: Diagnosis not present

## 2020-05-19 DIAGNOSIS — E785 Hyperlipidemia, unspecified: Secondary | ICD-10-CM | POA: Diagnosis not present

## 2020-05-19 DIAGNOSIS — Z993 Dependence on wheelchair: Secondary | ICD-10-CM | POA: Diagnosis not present

## 2020-05-19 DIAGNOSIS — I2510 Atherosclerotic heart disease of native coronary artery without angina pectoris: Secondary | ICD-10-CM | POA: Diagnosis not present

## 2020-05-19 DIAGNOSIS — I10 Essential (primary) hypertension: Secondary | ICD-10-CM | POA: Diagnosis not present

## 2020-05-19 DIAGNOSIS — I4891 Unspecified atrial fibrillation: Secondary | ICD-10-CM | POA: Diagnosis not present

## 2020-05-23 DIAGNOSIS — Z86010 Personal history of colonic polyps: Secondary | ICD-10-CM | POA: Diagnosis not present

## 2020-05-23 DIAGNOSIS — Z9181 History of falling: Secondary | ICD-10-CM | POA: Diagnosis not present

## 2020-05-23 DIAGNOSIS — Z952 Presence of prosthetic heart valve: Secondary | ICD-10-CM | POA: Diagnosis not present

## 2020-05-23 DIAGNOSIS — Z87891 Personal history of nicotine dependence: Secondary | ICD-10-CM | POA: Diagnosis not present

## 2020-05-23 DIAGNOSIS — I4891 Unspecified atrial fibrillation: Secondary | ICD-10-CM | POA: Diagnosis not present

## 2020-05-23 DIAGNOSIS — Z7951 Long term (current) use of inhaled steroids: Secondary | ICD-10-CM | POA: Diagnosis not present

## 2020-05-23 DIAGNOSIS — I69354 Hemiplegia and hemiparesis following cerebral infarction affecting left non-dominant side: Secondary | ICD-10-CM | POA: Diagnosis not present

## 2020-05-23 DIAGNOSIS — E785 Hyperlipidemia, unspecified: Secondary | ICD-10-CM | POA: Diagnosis not present

## 2020-05-23 DIAGNOSIS — Z993 Dependence on wheelchair: Secondary | ICD-10-CM | POA: Diagnosis not present

## 2020-05-23 DIAGNOSIS — Z951 Presence of aortocoronary bypass graft: Secondary | ICD-10-CM | POA: Diagnosis not present

## 2020-05-23 DIAGNOSIS — I10 Essential (primary) hypertension: Secondary | ICD-10-CM | POA: Diagnosis not present

## 2020-05-23 DIAGNOSIS — I2510 Atherosclerotic heart disease of native coronary artery without angina pectoris: Secondary | ICD-10-CM | POA: Diagnosis not present

## 2020-05-23 DIAGNOSIS — Z7901 Long term (current) use of anticoagulants: Secondary | ICD-10-CM | POA: Diagnosis not present

## 2020-05-27 DIAGNOSIS — I69354 Hemiplegia and hemiparesis following cerebral infarction affecting left non-dominant side: Secondary | ICD-10-CM | POA: Diagnosis not present

## 2020-05-27 DIAGNOSIS — Z993 Dependence on wheelchair: Secondary | ICD-10-CM | POA: Diagnosis not present

## 2020-05-27 DIAGNOSIS — I10 Essential (primary) hypertension: Secondary | ICD-10-CM | POA: Diagnosis not present

## 2020-05-27 DIAGNOSIS — I4891 Unspecified atrial fibrillation: Secondary | ICD-10-CM | POA: Diagnosis not present

## 2020-05-27 DIAGNOSIS — I2510 Atherosclerotic heart disease of native coronary artery without angina pectoris: Secondary | ICD-10-CM | POA: Diagnosis not present

## 2020-05-27 DIAGNOSIS — E785 Hyperlipidemia, unspecified: Secondary | ICD-10-CM | POA: Diagnosis not present

## 2020-06-02 DIAGNOSIS — Z993 Dependence on wheelchair: Secondary | ICD-10-CM | POA: Diagnosis not present

## 2020-06-02 DIAGNOSIS — E785 Hyperlipidemia, unspecified: Secondary | ICD-10-CM | POA: Diagnosis not present

## 2020-06-02 DIAGNOSIS — I69354 Hemiplegia and hemiparesis following cerebral infarction affecting left non-dominant side: Secondary | ICD-10-CM | POA: Diagnosis not present

## 2020-06-02 DIAGNOSIS — I10 Essential (primary) hypertension: Secondary | ICD-10-CM | POA: Diagnosis not present

## 2020-06-02 DIAGNOSIS — I4891 Unspecified atrial fibrillation: Secondary | ICD-10-CM | POA: Diagnosis not present

## 2020-06-02 DIAGNOSIS — I2510 Atherosclerotic heart disease of native coronary artery without angina pectoris: Secondary | ICD-10-CM | POA: Diagnosis not present

## 2020-06-10 DIAGNOSIS — I69354 Hemiplegia and hemiparesis following cerebral infarction affecting left non-dominant side: Secondary | ICD-10-CM | POA: Diagnosis not present

## 2020-06-10 DIAGNOSIS — I10 Essential (primary) hypertension: Secondary | ICD-10-CM | POA: Diagnosis not present

## 2020-06-10 DIAGNOSIS — I2510 Atherosclerotic heart disease of native coronary artery without angina pectoris: Secondary | ICD-10-CM | POA: Diagnosis not present

## 2020-06-10 DIAGNOSIS — E785 Hyperlipidemia, unspecified: Secondary | ICD-10-CM | POA: Diagnosis not present

## 2020-06-10 DIAGNOSIS — Z993 Dependence on wheelchair: Secondary | ICD-10-CM | POA: Diagnosis not present

## 2020-06-10 DIAGNOSIS — I4891 Unspecified atrial fibrillation: Secondary | ICD-10-CM | POA: Diagnosis not present

## 2020-06-12 NOTE — Progress Notes (Signed)
Arthur [name] Date of Birth: 15-Nov-1928   History of Present Illness: Arthur [name] is seen for followup of CAD. He has a history of coronary disease and is status post CABG in 2005. He had mitral valve repair and a Maze procedure at that time. He subsequently developed recurrent CVA and TEE demonstrated persistent left atrial appendage thrombus despite a history of previous ligation of the appendage. He has been on chronic anticoagulation.  In April 2016 he presented with a near syncopal episode. He was found to be in atrial fibrillation. Managed with rate control and anticoagulation. Converted back to NSR.    He underwent nuclear stress test in 01/03/2015 revealed a normal ejection fraction however there was a moderate sized, severe intensity, reversible inferior defect consistent with moderate inferior ischemia. Cardiac cath 01/24/15 which showed severe 3v obstructive CAD with patent LIMA-LAD, SVG-diag, SVG-OM3 and occluded SVG-PDA. He had successful stenting of prox RCA with a DES.   He was admitted on 07/11/17 with left sided weakness with right gaze preference. INR subtherapeutic at admission and CTA head neck showed calcified plaque of aorta and carotid bifurcation, right M2 superior division proximal occlusion with poor down stream collateralization and multiple areas of mild to moderate intracranial atherosclerosis. He underwent cerebral angio with complete revascularization of R-ACA with IA tPA and IV tPA. MRI/MRA brain done revealing acute moderate R-MCA, small right distal ACA infarcts, small right posterior watershed territory infarct with minimal petechial hemorrhage and old large left cerebellar infarct with successful revascularization of R-MCA occlusion. Planned to restart anticoagulation with Eliquis after 7-10 days.   He was admitted to the hospital from 12/8-12/12/19 with urosepsis and E. Coli bacteremia. He had AKI with creatinine up to 1.49. this improved to 1.14 at discharge. He  had Afib with RVR controlled with metoprolol. Continued on Eliquis.   On follow up today he is seen with his wife and CNA.  He is in a wheelchair.  He reports he is doing well.   Denies any Chest pain or edema. No dizziness. No bleeding. No SOB. BP at home is well controlled. Reports his prior cough has resolved.    Current Outpatient Medications on File Prior to Visit  Medication Sig Dispense Refill  . acetaminophen (TYLENOL) 650 MG CR tablet Take 650 mg by mouth daily as needed (knee pain).     . DULoxetine (CYMBALTA) 30 MG capsule Take 30 mg by mouth daily.    Marland Kitchen ELIQUIS 5 MG TABS tablet TAKE ONE TABLET BY MOUTH TWICE A DAY 180 tablet 1  . K Phos Mono-Sod Phos Di & Mono (PHOSPHA 250 NEUTRAL) 155-852-130 MG TABS TAKE ONE TABLET BY MOUTH DAILY AFTER BREAKFAST 90 tablet 2  . metoprolol tartrate (LOPRESSOR) 25 MG tablet TAKE 1 AND 1/2 TABLET BY MOUTH TWO TIMES A DAY 270 tablet 3  . montelukast (SINGULAIR) 10 MG tablet TAKE ONE TABLET BY MOUTH DAILY AFTER BREAKFAST 90 tablet 2  . pantoprazole (PROTONIX) 40 MG tablet TAKE ONE TABLET BY MOUTH DAILY 90 tablet 3  . rosuvastatin (CRESTOR) 20 MG tablet TAKE ONE TABLET BY MOUTH DAILY 44 tablet 9  . tiZANidine (ZANAFLEX) 4 MG tablet Take 1 tablet by mouth daily as needed for muscle spasms.     No current facility-administered medications on file prior to visit.    Allergies  Allergen Reactions  . Tape Other (See Comments)    PATIENT IS TAKING COUMADIN; HIS SKIN TEARS & BRUISES EASILY; PLEASE USE COBAN WRAP OR AN ALTERNATIVE TO  MEDICAL TAPE!!  . Lasix [Furosemide] Rash  . Penicillins Rash    Has patient had a PCN reaction causing immediate rash, facial/tongue/throat swelling, SOB or lightheadedness with hypotension: Yes Has patient had a PCN reaction causing severe rash involving mucus membranes or skin necrosis: Unknown Has patient had a PCN reaction that required hospitalization: Unknown Has patient had a PCN reaction occurring within the last 10  years: unknown If all of the above answers are "NO", then may proceed with Cephalosporin use.   Arthur [name] Drugs Cross Reactors Rash    Past Medical History:  Diagnosis Date  . Arthritis    "some in my joints" (01/24/2015)  . CKD (chronic kidney disease), stage II   . Coronary artery disease    a. s/p CABG in 2005 with MV repair and MAZE procedure. b. s/p DES to prox RCA 01/2015.   Marland Kitchen CVA (cerebrovascular accident) [facility]) ~ 2014   RIGHT BRAIN; denies residual on 01/24/2015  . Esophagitis    Distal esophagitis  . GERD (gastroesophageal reflux disease)   . Heart murmur   . History of hiatal hernia   . History of recurrent TIAs   . Hyperlipidemia   . Hypertension   . Hypertensive vascular disease   . MVP (mitral valve prolapse)   . Nephrolithiasis   . Odynophagia   . PAF (paroxysmal atrial fibrillation) ([place])   . Transudative pleural effusion     Past Surgical History:  Procedure Laterality Date  . APPENDECTOMY  1960's  . CARDIAC CATHETERIZATION  03/19/2004  . CARDIAC CATHETERIZATION N/A 01/24/2015   Procedure: Left Heart Cath and Coronary Angiography;  Surgeon: Arthur Lourenco M [place], MD;  Location: [place] [facility];  Service: Cardiovascular;  Laterality: N/A;  . CARDIAC CATHETERIZATION N/A 01/24/2015   Procedure: Coronary Stent Intervention;  Surgeon: Arthur Beyene M [place], MD;  Location: [place] [facility];  Service: Cardiovascular;  Laterality: N/A;  . CORONARY ANGIOPLASTY WITH STENT PLACEMENT  01/24/2015   "1 stent"  . CORONARY ARTERY BYPASS GRAFT  04/2004   LIMA GRAFT TO THE DISTAL LAD, SAPHENOUS VEIN GRAFT TO THE FIRST DIADGONAL BRANCH, SAPHENOUS VEIN GRAFT TO THE THIRD MARIGINAL BRANCH, AND SAPHENOUS VEIN GRAFT TO THE PDA  . IR ANGIO VERTEBRAL SEL SUBCLAVIAN INNOMINATE UNI R MOD SED  07/11/2017  . IR PERCUTANEOUS ART THROMBECTOMY/INFUSION INTRACRANIAL INC DIAG ANGIO  07/11/2017  . MAZE  04/2004  . MITRAL VALVE REPAIR  04/2004  . RADIOLOGY WITH ANESTHESIA N/A 07/11/2017   Procedure:  RADIOLOGY WITH ANESTHESIA;  Surgeon: Arthur Bras, MD;  Location: [place];  Service: Radiology;  Laterality: N/A;  . TEE WITHOUT CARDIOVERSION  05/19/2011   Procedure: TRANSESOPHAGEAL ECHOCARDIOGRAM (TEE);  Surgeon: Yusra Ravert [place], MD;  Location: [place];  Service: Cardiovascular;  Laterality: N/A;  . TONSILLECTOMY AND ADENOIDECTOMY  1944    Social History   Tobacco Use  Smoking Status Former Smoker  . Years: 3.00  . Types: Cigarettes  . Quit date: 06/15/1955  . Years since quitting: 65.0  Smokeless Tobacco Never Used    Social History   Substance and Sexual Activity  Alcohol Use No    Family History  Problem Relation Age of Onset  . Heart attack Mother   . Stroke Father   . Hypertension Father   . Kidney failure Father     Review of Systems: As noted in history of present illness.  All other systems were reviewed and are negative.  Physical Exam: BP 108/90 (BP Location: Right Arm, Patient Position: Sitting,  Cuff Size: Normal)   Pulse 75   Ht 5\' 7"  (1.702 m)   BMI 26.94 kg/m  GENERAL:  Well appearing elderly WM in NAD HEENT:  PERRL, EOMI, sclera are clear. Oropharynx is clear. NECK:  No jugular venous distention, carotid upstroke brisk and symmetric, no bruits, no thyromegaly or adenopathy LUNGS:  Clear to auscultation bilaterally CHEST:  Unremarkable HEART:  IRRR,  PMI not displaced or sustained,S1 and S2 within normal limits, no S3, no S4: no clicks, no rubs, no murmurs ABD:  Soft, nontender. BS +, no masses or bruits. No hepatomegaly, no splenomegaly EXT:  2 + pulses throughout, no edema, left leg in brace. SKIN:  Warm and dry.  No rashes NEURO:  Alert and oriented x 3. Cranial nerves II through XII intact. PSYCH:  Cognitively intact     LABORATORY DATA:   Lab Results  Component Value Date   WBC 7.0 05/21/2019   HGB 16.8 05/21/2019   HCT 50.3 05/21/2019   PLT 204 05/21/2019   GLUCOSE 89 05/21/2019   CHOL 142 05/21/2019   TRIG 105 05/21/2019    HDL 46 05/21/2019   LDLCALC 77 05/21/2019   ALT 15 05/21/2019   AST 22 05/21/2019   NA 144 05/21/2019   K 4.3 05/21/2019   CL 103 05/21/2019   CREATININE 1.27 05/21/2019   BUN 14 05/21/2019   CO2 21 05/21/2019   TSH 1.161 05/24/2018   INR 1.29 05/21/2018   HGBA1C 5.6 07/13/2017   Dated 03/13/20: cholesterol 145, triglycerides 98, HDL 47, LDL 66. Creatinine 1.32. otherwise CMET, CBC and TSH normal   Echo 07/12/17:Study Conclusions  - Left ventricle: The cavity size was normal. Systolic function was   normal. The estimated ejection fraction was in the range of 55%   to 60%. Wall motion was normal; there were no regional wall   motion abnormalities. Features are consistent with a pseudonormal   left ventricular filling pattern, with concomitant abnormal   relaxation and increased filling pressure (grade 2 diastolic   dysfunction). - Aortic valve: Transvalvular velocity was within the normal range.   There was no stenosis. There was no regurgitation. - Aorta: Ascending aortic diameter: 38 mm (S). - Ascending aorta: The ascending aorta was mildly dilated. - Mitral valve: Prior procedures included surgical repair.   Transvalvular velocity was within the normal range. There was no   evidence for stenosis. There was mild regurgitation. Valve area   by pressure half-time: 2.29 cm^2. - Left atrium: The atrium was moderately dilated. - Right ventricle: The cavity size was normal. Wall thickness was   normal. Systolic function was normal. - Atrial septum: No defect or patent foramen ovale was identified. - Tricuspid valve: There was mild regurgitation. - Pulmonary arteries: Systolic pressure was severely increased. PA   peak pressure: 56 mm Hg (S).    Assessment / Plan: 1.  CVA recurrent embolic right MCA. Remote TEE documented a left atrial appendage thrombus despite prior ligation of the atrial appendage. He had been on chronic anticoagulation with Eliquis   2. Coronary disease  status post CABG in 2005. He also had a Maze procedure at that time.  Cardiac cath in August 2016 showed all grafts were patent except SVG to the RCA that was occluded. He underwent successful stenting of the proximal RCA with DES. He remains  asymptomatic.   3. Atrial fibrillation-persistent. status post Maze procedure. Will continue metoprolol and Eliquis.    4. Hypercholesterolemia. On chronic Crestor. At goal.  5.  HTN. Blood pressure is well controlled     follow up in 6 months.

## 2020-06-17 DIAGNOSIS — Z993 Dependence on wheelchair: Secondary | ICD-10-CM | POA: Diagnosis not present

## 2020-06-17 DIAGNOSIS — I4891 Unspecified atrial fibrillation: Secondary | ICD-10-CM | POA: Diagnosis not present

## 2020-06-17 DIAGNOSIS — I10 Essential (primary) hypertension: Secondary | ICD-10-CM | POA: Diagnosis not present

## 2020-06-17 DIAGNOSIS — I2510 Atherosclerotic heart disease of native coronary artery without angina pectoris: Secondary | ICD-10-CM | POA: Diagnosis not present

## 2020-06-17 DIAGNOSIS — I69354 Hemiplegia and hemiparesis following cerebral infarction affecting left non-dominant side: Secondary | ICD-10-CM | POA: Diagnosis not present

## 2020-06-17 DIAGNOSIS — E785 Hyperlipidemia, unspecified: Secondary | ICD-10-CM | POA: Diagnosis not present

## 2020-06-18 ENCOUNTER — Other Ambulatory Visit: Payer: Self-pay

## 2020-06-18 ENCOUNTER — Ambulatory Visit (INDEPENDENT_AMBULATORY_CARE_PROVIDER_SITE_OTHER): Payer: Medicare Other | Admitting: Cardiology

## 2020-06-18 ENCOUNTER — Other Ambulatory Visit: Payer: Self-pay | Admitting: Cardiology

## 2020-06-18 ENCOUNTER — Encounter: Payer: Self-pay | Admitting: Cardiology

## 2020-06-18 VITALS — BP 108/90 | HR 75 | Ht 67.0 in

## 2020-06-18 DIAGNOSIS — I4819 Other persistent atrial fibrillation: Secondary | ICD-10-CM

## 2020-06-18 DIAGNOSIS — I25810 Atherosclerosis of coronary artery bypass graft(s) without angina pectoris: Secondary | ICD-10-CM | POA: Diagnosis not present

## 2020-06-18 DIAGNOSIS — I10 Essential (primary) hypertension: Secondary | ICD-10-CM

## 2020-06-18 DIAGNOSIS — I341 Nonrheumatic mitral (valve) prolapse: Secondary | ICD-10-CM

## 2020-06-18 DIAGNOSIS — E7800 Pure hypercholesterolemia, unspecified: Secondary | ICD-10-CM

## 2020-06-18 MED ORDER — NITROGLYCERIN 0.4 MG SL SUBL: SUBLINGUAL_TABLET | SUBLINGUAL | 2 refills | Status: DC

## 2020-06-22 DIAGNOSIS — Z952 Presence of prosthetic heart valve: Secondary | ICD-10-CM | POA: Diagnosis not present

## 2020-06-22 DIAGNOSIS — I2510 Atherosclerotic heart disease of native coronary artery without angina pectoris: Secondary | ICD-10-CM | POA: Diagnosis not present

## 2020-06-22 DIAGNOSIS — I10 Essential (primary) hypertension: Secondary | ICD-10-CM | POA: Diagnosis not present

## 2020-06-22 DIAGNOSIS — I4891 Unspecified atrial fibrillation: Secondary | ICD-10-CM | POA: Diagnosis not present

## 2020-06-22 DIAGNOSIS — Z79899 Other long term (current) drug therapy: Secondary | ICD-10-CM | POA: Diagnosis not present

## 2020-06-22 DIAGNOSIS — I69354 Hemiplegia and hemiparesis following cerebral infarction affecting left non-dominant side: Secondary | ICD-10-CM | POA: Diagnosis not present

## 2020-06-22 DIAGNOSIS — Z7901 Long term (current) use of anticoagulants: Secondary | ICD-10-CM | POA: Diagnosis not present

## 2020-06-22 DIAGNOSIS — Z86010 Personal history of colonic polyps: Secondary | ICD-10-CM | POA: Diagnosis not present

## 2020-06-22 DIAGNOSIS — Z951 Presence of aortocoronary bypass graft: Secondary | ICD-10-CM | POA: Diagnosis not present

## 2020-06-22 DIAGNOSIS — E785 Hyperlipidemia, unspecified: Secondary | ICD-10-CM | POA: Diagnosis not present

## 2020-06-22 DIAGNOSIS — Z993 Dependence on wheelchair: Secondary | ICD-10-CM | POA: Diagnosis not present

## 2020-06-22 DIAGNOSIS — Z9181 History of falling: Secondary | ICD-10-CM | POA: Diagnosis not present

## 2020-06-22 DIAGNOSIS — Z87891 Personal history of nicotine dependence: Secondary | ICD-10-CM | POA: Diagnosis not present

## 2020-06-24 DIAGNOSIS — Z993 Dependence on wheelchair: Secondary | ICD-10-CM | POA: Diagnosis not present

## 2020-06-24 DIAGNOSIS — I69354 Hemiplegia and hemiparesis following cerebral infarction affecting left non-dominant side: Secondary | ICD-10-CM | POA: Diagnosis not present

## 2020-06-24 DIAGNOSIS — E785 Hyperlipidemia, unspecified: Secondary | ICD-10-CM | POA: Diagnosis not present

## 2020-06-24 DIAGNOSIS — I10 Essential (primary) hypertension: Secondary | ICD-10-CM | POA: Diagnosis not present

## 2020-06-24 DIAGNOSIS — I4891 Unspecified atrial fibrillation: Secondary | ICD-10-CM | POA: Diagnosis not present

## 2020-06-24 DIAGNOSIS — I2510 Atherosclerotic heart disease of native coronary artery without angina pectoris: Secondary | ICD-10-CM | POA: Diagnosis not present

## 2020-07-02 DIAGNOSIS — N390 Urinary tract infection, site not specified: Secondary | ICD-10-CM | POA: Diagnosis not present

## 2020-07-07 DIAGNOSIS — E785 Hyperlipidemia, unspecified: Secondary | ICD-10-CM | POA: Diagnosis not present

## 2020-07-07 DIAGNOSIS — I2510 Atherosclerotic heart disease of native coronary artery without angina pectoris: Secondary | ICD-10-CM | POA: Diagnosis not present

## 2020-07-07 DIAGNOSIS — I4891 Unspecified atrial fibrillation: Secondary | ICD-10-CM | POA: Diagnosis not present

## 2020-07-07 DIAGNOSIS — Z993 Dependence on wheelchair: Secondary | ICD-10-CM | POA: Diagnosis not present

## 2020-07-07 DIAGNOSIS — I69354 Hemiplegia and hemiparesis following cerebral infarction affecting left non-dominant side: Secondary | ICD-10-CM | POA: Diagnosis not present

## 2020-07-07 DIAGNOSIS — I10 Essential (primary) hypertension: Secondary | ICD-10-CM | POA: Diagnosis not present

## 2020-07-14 DIAGNOSIS — E785 Hyperlipidemia, unspecified: Secondary | ICD-10-CM | POA: Diagnosis not present

## 2020-07-14 DIAGNOSIS — Z993 Dependence on wheelchair: Secondary | ICD-10-CM | POA: Diagnosis not present

## 2020-07-14 DIAGNOSIS — I4891 Unspecified atrial fibrillation: Secondary | ICD-10-CM | POA: Diagnosis not present

## 2020-07-14 DIAGNOSIS — I10 Essential (primary) hypertension: Secondary | ICD-10-CM | POA: Diagnosis not present

## 2020-07-14 DIAGNOSIS — I69354 Hemiplegia and hemiparesis following cerebral infarction affecting left non-dominant side: Secondary | ICD-10-CM | POA: Diagnosis not present

## 2020-07-14 DIAGNOSIS — I2510 Atherosclerotic heart disease of native coronary artery without angina pectoris: Secondary | ICD-10-CM | POA: Diagnosis not present

## 2020-07-18 ENCOUNTER — Other Ambulatory Visit: Payer: Self-pay | Admitting: Cardiology

## 2020-07-22 DIAGNOSIS — I4891 Unspecified atrial fibrillation: Secondary | ICD-10-CM | POA: Diagnosis not present

## 2020-07-22 DIAGNOSIS — Z993 Dependence on wheelchair: Secondary | ICD-10-CM | POA: Diagnosis not present

## 2020-07-22 DIAGNOSIS — I2510 Atherosclerotic heart disease of native coronary artery without angina pectoris: Secondary | ICD-10-CM | POA: Diagnosis not present

## 2020-07-22 DIAGNOSIS — I10 Essential (primary) hypertension: Secondary | ICD-10-CM | POA: Diagnosis not present

## 2020-07-22 DIAGNOSIS — Z7901 Long term (current) use of anticoagulants: Secondary | ICD-10-CM | POA: Diagnosis not present

## 2020-07-22 DIAGNOSIS — Z86010 Personal history of colonic polyps: Secondary | ICD-10-CM | POA: Diagnosis not present

## 2020-07-22 DIAGNOSIS — Z952 Presence of prosthetic heart valve: Secondary | ICD-10-CM | POA: Diagnosis not present

## 2020-07-22 DIAGNOSIS — Z951 Presence of aortocoronary bypass graft: Secondary | ICD-10-CM | POA: Diagnosis not present

## 2020-07-22 DIAGNOSIS — Z9181 History of falling: Secondary | ICD-10-CM | POA: Diagnosis not present

## 2020-07-22 DIAGNOSIS — I69354 Hemiplegia and hemiparesis following cerebral infarction affecting left non-dominant side: Secondary | ICD-10-CM | POA: Diagnosis not present

## 2020-07-22 DIAGNOSIS — E785 Hyperlipidemia, unspecified: Secondary | ICD-10-CM | POA: Diagnosis not present

## 2020-07-22 DIAGNOSIS — Z79899 Other long term (current) drug therapy: Secondary | ICD-10-CM | POA: Diagnosis not present

## 2020-07-22 DIAGNOSIS — Z87891 Personal history of nicotine dependence: Secondary | ICD-10-CM | POA: Diagnosis not present

## 2020-07-24 DIAGNOSIS — I4891 Unspecified atrial fibrillation: Secondary | ICD-10-CM | POA: Diagnosis not present

## 2020-07-24 DIAGNOSIS — I69354 Hemiplegia and hemiparesis following cerebral infarction affecting left non-dominant side: Secondary | ICD-10-CM | POA: Diagnosis not present

## 2020-07-24 DIAGNOSIS — I10 Essential (primary) hypertension: Secondary | ICD-10-CM | POA: Diagnosis not present

## 2020-07-24 DIAGNOSIS — Z993 Dependence on wheelchair: Secondary | ICD-10-CM | POA: Diagnosis not present

## 2020-07-24 DIAGNOSIS — E785 Hyperlipidemia, unspecified: Secondary | ICD-10-CM | POA: Diagnosis not present

## 2020-07-24 DIAGNOSIS — I2510 Atherosclerotic heart disease of native coronary artery without angina pectoris: Secondary | ICD-10-CM | POA: Diagnosis not present

## 2020-07-30 DIAGNOSIS — I69354 Hemiplegia and hemiparesis following cerebral infarction affecting left non-dominant side: Secondary | ICD-10-CM | POA: Diagnosis not present

## 2020-07-30 DIAGNOSIS — Z993 Dependence on wheelchair: Secondary | ICD-10-CM | POA: Diagnosis not present

## 2020-07-30 DIAGNOSIS — E785 Hyperlipidemia, unspecified: Secondary | ICD-10-CM | POA: Diagnosis not present

## 2020-07-30 DIAGNOSIS — I4891 Unspecified atrial fibrillation: Secondary | ICD-10-CM | POA: Diagnosis not present

## 2020-07-30 DIAGNOSIS — I10 Essential (primary) hypertension: Secondary | ICD-10-CM | POA: Diagnosis not present

## 2020-07-30 DIAGNOSIS — I2510 Atherosclerotic heart disease of native coronary artery without angina pectoris: Secondary | ICD-10-CM | POA: Diagnosis not present

## 2020-08-04 ENCOUNTER — Other Ambulatory Visit: Payer: Self-pay | Admitting: Cardiology

## 2020-08-07 DIAGNOSIS — Z993 Dependence on wheelchair: Secondary | ICD-10-CM | POA: Diagnosis not present

## 2020-08-07 DIAGNOSIS — I10 Essential (primary) hypertension: Secondary | ICD-10-CM | POA: Diagnosis not present

## 2020-08-07 DIAGNOSIS — I69354 Hemiplegia and hemiparesis following cerebral infarction affecting left non-dominant side: Secondary | ICD-10-CM | POA: Diagnosis not present

## 2020-08-07 DIAGNOSIS — I2510 Atherosclerotic heart disease of native coronary artery without angina pectoris: Secondary | ICD-10-CM | POA: Diagnosis not present

## 2020-08-07 DIAGNOSIS — E785 Hyperlipidemia, unspecified: Secondary | ICD-10-CM | POA: Diagnosis not present

## 2020-08-07 DIAGNOSIS — I4891 Unspecified atrial fibrillation: Secondary | ICD-10-CM | POA: Diagnosis not present

## 2020-08-14 DIAGNOSIS — I4891 Unspecified atrial fibrillation: Secondary | ICD-10-CM | POA: Diagnosis not present

## 2020-08-14 DIAGNOSIS — I69354 Hemiplegia and hemiparesis following cerebral infarction affecting left non-dominant side: Secondary | ICD-10-CM | POA: Diagnosis not present

## 2020-08-14 DIAGNOSIS — I10 Essential (primary) hypertension: Secondary | ICD-10-CM | POA: Diagnosis not present

## 2020-08-14 DIAGNOSIS — Z993 Dependence on wheelchair: Secondary | ICD-10-CM | POA: Diagnosis not present

## 2020-08-14 DIAGNOSIS — I2510 Atherosclerotic heart disease of native coronary artery without angina pectoris: Secondary | ICD-10-CM | POA: Diagnosis not present

## 2020-08-14 DIAGNOSIS — E785 Hyperlipidemia, unspecified: Secondary | ICD-10-CM | POA: Diagnosis not present

## 2020-08-18 DIAGNOSIS — Z993 Dependence on wheelchair: Secondary | ICD-10-CM | POA: Diagnosis not present

## 2020-08-18 DIAGNOSIS — I69354 Hemiplegia and hemiparesis following cerebral infarction affecting left non-dominant side: Secondary | ICD-10-CM | POA: Diagnosis not present

## 2020-08-18 DIAGNOSIS — I10 Essential (primary) hypertension: Secondary | ICD-10-CM | POA: Diagnosis not present

## 2020-08-18 DIAGNOSIS — I2510 Atherosclerotic heart disease of native coronary artery without angina pectoris: Secondary | ICD-10-CM | POA: Diagnosis not present

## 2020-08-18 DIAGNOSIS — I4891 Unspecified atrial fibrillation: Secondary | ICD-10-CM | POA: Diagnosis not present

## 2020-08-18 DIAGNOSIS — E785 Hyperlipidemia, unspecified: Secondary | ICD-10-CM | POA: Diagnosis not present

## 2020-09-10 ENCOUNTER — Ambulatory Visit: Payer: Medicare Other

## 2020-09-10 ENCOUNTER — Ambulatory Visit: Payer: Medicare Other | Admitting: Occupational Therapy

## 2020-11-07 DIAGNOSIS — G8114 Spastic hemiplegia affecting left nondominant side: Secondary | ICD-10-CM | POA: Diagnosis not present

## 2020-11-07 DIAGNOSIS — Z8673 Personal history of transient ischemic attack (TIA), and cerebral infarction without residual deficits: Secondary | ICD-10-CM | POA: Diagnosis not present

## 2020-11-07 DIAGNOSIS — D6869 Other thrombophilia: Secondary | ICD-10-CM | POA: Diagnosis not present

## 2020-11-07 DIAGNOSIS — N1831 Chronic kidney disease, stage 3a: Secondary | ICD-10-CM | POA: Diagnosis not present

## 2020-11-07 DIAGNOSIS — K219 Gastro-esophageal reflux disease without esophagitis: Secondary | ICD-10-CM | POA: Diagnosis not present

## 2020-11-09 ENCOUNTER — Other Ambulatory Visit: Payer: Self-pay | Admitting: Cardiology

## 2020-11-11 NOTE — Telephone Encounter (Signed)
90m, 78kg, Creatinine, Serum 1.320 mg/ 03/13/2020, lovw/jordan 06/18/20

## 2020-12-11 DIAGNOSIS — Z8673 Personal history of transient ischemic attack (TIA), and cerebral infarction without residual deficits: Secondary | ICD-10-CM | POA: Diagnosis not present

## 2020-12-11 DIAGNOSIS — G8114 Spastic hemiplegia affecting left nondominant side: Secondary | ICD-10-CM | POA: Diagnosis not present

## 2020-12-12 NOTE — Progress Notes (Signed)
Arthur [name] Date of Birth: 06-15-1928   History of Present Illness: Arthur [name] is seen for followup of CAD. He has a history of coronary disease and is status post CABG in 2005. He had mitral valve repair and a Maze procedure at that time. He subsequently developed recurrent CVA and TEE demonstrated persistent left atrial appendage thrombus despite a history of previous ligation of the appendage. He has been on chronic anticoagulation.  In April 2016 he presented with a near syncopal episode. He was found to be in atrial fibrillation. Managed with rate control and anticoagulation. Converted back to NSR.    He underwent nuclear stress test in 01/03/2015 revealed a normal ejection fraction however there was a moderate sized, severe intensity, reversible inferior defect consistent with moderate inferior ischemia. Cardiac cath 01/24/15 which showed severe 3v obstructive CAD with patent LIMA-LAD, SVG-diag, SVG-OM3 and occluded SVG-PDA. He had successful stenting of prox RCA with a DES.   He was admitted on 07/11/17 with left sided weakness with right gaze preference. INR subtherapeutic at admission and CTA head neck showed calcified plaque of aorta and carotid bifurcation, right M2 superior division proximal occlusion with poor down stream collateralization and multiple areas of mild to moderate intracranial atherosclerosis. He underwent cerebral angio with complete revascularization of R-ACA with IA tPA and IV tPA.  MRI/MRA brain done revealing acute moderate R-MCA, small right distal ACA infarcts, small right posterior watershed territory infarct with minimal petechial hemorrhage and old large left cerebellar infarct with successful revascularization of R-MCA occlusion. Planned to restart anticoagulation with Eliquis after 7-10 days.   He was admitted to the hospital from 12/8-12/12/19 with urosepsis and Arthur [name]. He had AKI with creatinine up to 1.49. this improved to 1.14 at discharge. He  had Afib with RVR controlled with metoprolol. Continued on Eliquis.   On follow up today he is seen with his wife and Arthur [name].  He is in a wheelchair.  He reports he is doing well.   Denies any Chest pain or edema. No dizziness. No bleeding. No SOB. BP at home is typically 096-045 systolic. He is stressed having to get in and out of car for appt.    Current Outpatient Medications on File Prior to Visit  Medication Sig Dispense Refill   acetaminophen (TYLENOL) 650 MG CR tablet Take 650 mg by mouth daily as needed (knee pain).      DULoxetine (CYMBALTA) 30 MG capsule Take 30 mg by mouth daily.     ELIQUIS 5 MG TABS tablet TAKE ONE TABLET BY MOUTH TWICE A DAY 180 tablet 1   K Phos Mono-Sod Phos Di & Mono (PHOSPHA 250 NEUTRAL) 155-852-130 MG TABS TAKE ONE TABLET BY MOUTH DAILY AFTER BREAKFAST 90 tablet 3   metoprolol tartrate (LOPRESSOR) 25 MG tablet TAKE 1 AND 1/2 TABLET BY MOUTH TWO TIMES A DAY 270 tablet 3   montelukast (SINGULAIR) 10 MG tablet TAKE ONE TABLET BY MOUTH DAILY AFTER BREAKFAST 90 tablet 2   nitroGLYCERIN (NITROSTAT) 0.4 MG SL tablet PLACE 1 TABLET (0.4 MG TOTAL) UNDER THE TONGUE EVERY 5 (FIVE) MINUTESAS NEEDED FOR CHEST PAIN (UP TO 3 DOSES). 25 tablet 2   pantoprazole (PROTONIX) 40 MG tablet TAKE ONE TABLET BY MOUTH DAILY 90 tablet 3   rosuvastatin (CRESTOR) 20 MG tablet TAKE ONE TABLET BY MOUTH DAILY 44 tablet 9   tiZANidine (ZANAFLEX) 4 MG tablet Take 1 tablet by mouth daily as needed for muscle spasms.     No current  facility-administered medications on file prior to visit.    Allergies  Allergen Reactions   Tape Other (See Comments)    PATIENT IS TAKING COUMADIN; HIS SKIN TEARS & BRUISES EASILY; PLEASE USE COBAN WRAP OR AN ALTERNATIVE TO MEDICAL TAPE!!   Lasix [Furosemide] Rash   Penicillins Rash    Has patient had a PCN reaction causing immediate rash, facial/tongue/throat swelling, SOB or lightheadedness with hypotension: Yes Has patient had a PCN reaction causing severe  rash involving mucus membranes or skin necrosis: Unknown Has patient had a PCN reaction that required hospitalization: Unknown Has patient had a PCN reaction occurring within the last 10 years: unknown If all of the above answers are "NO", then may proceed with Cephalosporin use.    Sulfa Drugs Cross Reactors Rash    Past Medical History:  Diagnosis Date   Arthritis    "some in my joints" (01/24/2015)   CKD (chronic kidney disease), stage II    Coronary artery disease    a. s/p CABG in 2005 with MV repair and MAZE procedure. b. s/p DES to prox RCA 01/2015.    CVA (cerebrovascular accident) ([place]) ~ 2014   RIGHT BRAIN; denies residual on 01/24/2015   Esophagitis    Distal esophagitis   GERD (gastroesophageal reflux disease)    Heart murmur    History of hiatal hernia    History of recurrent TIAs    Hyperlipidemia    Hypertension    Hypertensive vascular disease    MVP (mitral valve prolapse)    Nephrolithiasis    Odynophagia    PAF (paroxysmal atrial fibrillation) ([facility])    Transudative pleural effusion     Past Surgical History:  Procedure Laterality Date   APPENDECTOMY  1960's   CARDIAC CATHETERIZATION  03/19/2004   CARDIAC CATHETERIZATION N/A 01/24/2015   Procedure: Left Heart Cath and Coronary Angiography;  Surgeon: Genola Yuille M [place], MD;  Location: [place] [facility];  Service: Cardiovascular;  Laterality: N/A;   CARDIAC CATHETERIZATION N/A 01/24/2015   Procedure: Coronary Stent Intervention;  Surgeon: Katharin Schneider M [place], MD;  Location: [place] [facility];  Service: Cardiovascular;  Laterality: N/A;   CORONARY ANGIOPLASTY WITH STENT PLACEMENT  01/24/2015   "1 stent"   CORONARY ARTERY BYPASS GRAFT  04/2004   LIMA GRAFT TO THE DISTAL LAD, SAPHENOUS VEIN GRAFT TO THE FIRST DIADGONAL BRANCH, SAPHENOUS VEIN GRAFT TO THE THIRD MARIGINAL BRANCH, AND SAPHENOUS VEIN GRAFT TO THE PDA   IR ANGIO VERTEBRAL SEL SUBCLAVIAN INNOMINATE UNI R MOD SED  07/11/2017   IR PERCUTANEOUS ART  THROMBECTOMY/INFUSION INTRACRANIAL INC DIAG ANGIO  07/11/2017   MAZE  04/2004   MITRAL VALVE REPAIR  04/2004   RADIOLOGY WITH ANESTHESIA N/A 07/11/2017   Procedure: RADIOLOGY WITH ANESTHESIA;  Surgeon: Luanne Bras, MD;  Location: [place];  Service: Radiology;  Laterality: N/A;   TEE WITHOUT CARDIOVERSION  05/19/2011   Procedure: TRANSESOPHAGEAL ECHOCARDIOGRAM (TEE);  Surgeon: Trueman Worlds [place], MD;  Location: [facility];  Service: Cardiovascular;  Laterality: N/A;   TONSILLECTOMY AND ADENOIDECTOMY  1944    Social History   Tobacco Use  Smoking Status Former   Years: 3.00   Pack years: 0.00   Types: Cigarettes   Quit date: 06/15/1955   Years since quitting: 65.5  Smokeless Tobacco Never    Social History   Substance and Sexual Activity  Alcohol Use No    Family History  Problem Relation Age of Onset   Heart attack Mother    Stroke Father  Hypertension Father    Kidney failure Father     Review of Systems: As noted in history of present illness.  All other systems were reviewed and are negative.  Physical Exam: BP (!) 154/88   Pulse 76   Ht 5\' 7"  (1.702 m)   Wt 168 lb (76.2 kg)   SpO2 95%   BMI 26.31 kg/m  GENERAL:  Well appearing elderly WM in NAD HEENT:  PERRL, EOMI, sclera are clear. Oropharynx is clear. NECK:  No jugular venous distention, carotid upstroke brisk and symmetric, no bruits, no thyromegaly or adenopathy LUNGS:  Clear to auscultation bilaterally CHEST:  Unremarkable HEART:  IRRR,  PMI not displaced or sustained,S1 and S2 within normal limits, no S3, no S4: no clicks, no rubs, no murmurs ABD:  Soft, nontender. BS +, no masses or bruits. No hepatomegaly, no splenomegaly EXT:  2 + pulses throughout, no edema, left leg in brace. SKIN:  Warm and dry.  No rashes NEURO:  Alert and oriented x 3. Cranial nerves II through XII intact. PSYCH:  Cognitively intact     LABORATORY DATA:   Lab Results  Component Value Date   WBC 7.0 05/21/2019   HGB  16.8 05/21/2019   HCT 50.3 05/21/2019   PLT 204 05/21/2019   GLUCOSE 89 05/21/2019   CHOL 142 05/21/2019   TRIG 105 05/21/2019   HDL 46 05/21/2019   LDLCALC 77 05/21/2019   ALT 15 05/21/2019   AST 22 05/21/2019   NA 144 05/21/2019   K 4.3 05/21/2019   CL 103 05/21/2019   CREATININE 1.27 05/21/2019   BUN 14 05/21/2019   CO2 21 05/21/2019   TSH 1.161 05/24/2018   INR 1.29 05/21/2018   HGBA1C 5.6 07/13/2017   Dated 03/13/20: cholesterol 145, triglycerides 98, HDL 47, LDL 66. Creatinine 1.32. otherwise CMET, CBC and TSH normal  Ecg today shows Afib with rate 76. LAD, low voltage. Old inferior and anterior infarcts. Unchanged from prior. right    Echo 07/12/17:Study Conclusions   - Left ventricle: The cavity size was normal. Systolic function was   normal. The estimated ejection fraction was in the range of 55%   to 60%. Wall motion was normal; there were no regional wall   motion abnormalities. Features are consistent with a pseudonormal   left ventricular filling pattern, with concomitant abnormal   relaxation and increased filling pressure (grade 2 diastolic   dysfunction). - Aortic valve: Transvalvular velocity was within the normal range.   There was no stenosis. There was no regurgitation. - Aorta: Ascending aortic diameter: 38 mm (S). - Ascending aorta: The ascending aorta was mildly dilated. - Mitral valve: Prior procedures included surgical repair.   Transvalvular velocity was within the normal range. There was no   evidence for stenosis. There was mild regurgitation. Valve area   by pressure half-time: 2.29 cm^2. - Left atrium: The atrium was moderately dilated. - Right ventricle: The cavity size was normal. Wall thickness was   normal. Systolic function was normal. - Atrial septum: No defect or patent foramen ovale was identified. - Tricuspid valve: There was mild regurgitation. - Pulmonary arteries: Systolic pressure was severely increased. PA   peak pressure: 56  mm Hg (S).    Assessment / Plan: 1.  CVA recurrent embolic right MCA. Remote TEE documented a left atrial appendage thrombus despite prior ligation of the atrial appendage. He had been on chronic anticoagulation with Eliquis   2. Coronary disease status post CABG in 2005. He  also had a Maze procedure at that time.  Cardiac cath in August 2016 showed all grafts were patent except SVG to the RCA that was occluded. He underwent successful stenting of the proximal RCA with DES. He remains  asymptomatic.   3. Atrial fibrillation-persistent/ permanent.  status post Maze procedure. Will continue metoprolol for rate control and Eliquis.    4. Hypercholesterolemia. On chronic Crestor.   5. HTN. Blood pressure is well controlled  Will check lab work today.    follow up in 6 months.

## 2020-12-17 ENCOUNTER — Other Ambulatory Visit: Payer: Self-pay

## 2020-12-17 ENCOUNTER — Encounter: Payer: Self-pay | Admitting: Cardiology

## 2020-12-17 ENCOUNTER — Ambulatory Visit (INDEPENDENT_AMBULATORY_CARE_PROVIDER_SITE_OTHER): Payer: Medicare Other | Admitting: Cardiology

## 2020-12-17 VITALS — BP 154/88 | HR 76 | Ht 67.0 in | Wt 168.0 lb

## 2020-12-17 DIAGNOSIS — E7800 Pure hypercholesterolemia, unspecified: Secondary | ICD-10-CM

## 2020-12-17 DIAGNOSIS — I4811 Longstanding persistent atrial fibrillation: Secondary | ICD-10-CM | POA: Diagnosis not present

## 2020-12-17 DIAGNOSIS — I25810 Atherosclerosis of coronary artery bypass graft(s) without angina pectoris: Secondary | ICD-10-CM

## 2020-12-17 DIAGNOSIS — I10 Essential (primary) hypertension: Secondary | ICD-10-CM | POA: Diagnosis not present

## 2020-12-18 DIAGNOSIS — L600 Ingrowing nail: Secondary | ICD-10-CM | POA: Diagnosis not present

## 2020-12-18 DIAGNOSIS — L97522 Non-pressure chronic ulcer of other part of left foot with fat layer exposed: Secondary | ICD-10-CM | POA: Diagnosis not present

## 2020-12-18 DIAGNOSIS — B351 Tinea unguium: Secondary | ICD-10-CM | POA: Diagnosis not present

## 2020-12-18 DIAGNOSIS — M79674 Pain in right toe(s): Secondary | ICD-10-CM | POA: Diagnosis not present

## 2020-12-18 DIAGNOSIS — M79675 Pain in left toe(s): Secondary | ICD-10-CM | POA: Diagnosis not present

## 2020-12-18 LAB — HEPATIC FUNCTION PANEL
ALT: 15 IU/L (ref 0–44)
AST: 18 IU/L (ref 0–40)
Albumin: 4.5 g/dL (ref 3.5–4.6)
Alkaline Phosphatase: 85 IU/L (ref 44–121)
Bilirubin Total: 0.6 mg/dL (ref 0.0–1.2)
Bilirubin, Direct: 0.19 mg/dL (ref 0.00–0.40)
Total Protein: 6.9 g/dL (ref 6.0–8.5)

## 2020-12-18 LAB — CBC WITH DIFFERENTIAL/PLATELET
Basophils Absolute: 0.1 10*3/uL (ref 0.0–0.2)
Basos: 1 %
EOS (ABSOLUTE): 0.3 10*3/uL (ref 0.0–0.4)
Eos: 4 %
Hematocrit: 48.4 % (ref 37.5–51.0)
Hemoglobin: 15.5 g/dL (ref 13.0–17.7)
Immature Grans (Abs): 0 10*3/uL (ref 0.0–0.1)
Immature Granulocytes: 0 %
Lymphocytes Absolute: 1.2 10*3/uL (ref 0.7–3.1)
Lymphs: 17 %
MCH: 28.8 pg (ref 26.6–33.0)
MCHC: 32 g/dL (ref 31.5–35.7)
MCV: 90 fL (ref 79–97)
Monocytes Absolute: 0.6 10*3/uL (ref 0.1–0.9)
Monocytes: 8 %
Neutrophils Absolute: 5 10*3/uL (ref 1.4–7.0)
Neutrophils: 70 %
Platelets: 300 10*3/uL (ref 150–450)
RBC: 5.39 x10E6/uL (ref 4.14–5.80)
RDW: 13.5 % (ref 11.6–15.4)
WBC: 7.1 10*3/uL (ref 3.4–10.8)

## 2020-12-18 LAB — BASIC METABOLIC PANEL
BUN/Creatinine Ratio: 13 (ref 10–24)
BUN: 18 mg/dL (ref 10–36)
CO2: 24 mmol/L (ref 20–29)
Calcium: 9.7 mg/dL (ref 8.6–10.2)
Chloride: 100 mmol/L (ref 96–106)
Creatinine, Ser: 1.34 mg/dL — ABNORMAL HIGH (ref 0.76–1.27)
Glucose: 91 mg/dL (ref 65–99)
Potassium: 4.3 mmol/L (ref 3.5–5.2)
Sodium: 138 mmol/L (ref 134–144)
eGFR: 50 mL/min/{1.73_m2} — ABNORMAL LOW (ref >59)

## 2020-12-18 LAB — LIPID PANEL
Chol/HDL Ratio: 3.3 ratio (ref 0.0–5.0)
Cholesterol, Total: 153 mg/dL (ref 100–199)
HDL: 46 mg/dL (ref >39)
LDL Chol Calc (NIH): 89 mg/dL (ref 0–99)
Triglycerides: 98 mg/dL (ref 0–149)
VLDL Cholesterol Cal: 18 mg/dL (ref 5–40)

## 2020-12-18 LAB — TSH: TSH: 1.65 u[IU]/mL (ref 0.450–4.500)

## 2020-12-25 DIAGNOSIS — L97522 Non-pressure chronic ulcer of other part of left foot with fat layer exposed: Secondary | ICD-10-CM | POA: Diagnosis not present

## 2021-02-17 ENCOUNTER — Other Ambulatory Visit: Payer: Self-pay | Admitting: Cardiology

## 2021-03-30 DIAGNOSIS — N1831 Chronic kidney disease, stage 3a: Secondary | ICD-10-CM | POA: Diagnosis not present

## 2021-03-30 DIAGNOSIS — D6869 Other thrombophilia: Secondary | ICD-10-CM | POA: Diagnosis not present

## 2021-03-30 DIAGNOSIS — R509 Fever, unspecified: Secondary | ICD-10-CM | POA: Diagnosis not present

## 2021-03-30 DIAGNOSIS — R8290 Unspecified abnormal findings in urine: Secondary | ICD-10-CM | POA: Diagnosis not present

## 2021-03-30 DIAGNOSIS — G8114 Spastic hemiplegia affecting left nondominant side: Secondary | ICD-10-CM | POA: Diagnosis not present

## 2021-04-01 DIAGNOSIS — R410 Disorientation, unspecified: Secondary | ICD-10-CM | POA: Diagnosis not present

## 2021-04-17 ENCOUNTER — Other Ambulatory Visit: Payer: Self-pay | Admitting: Cardiology

## 2021-05-05 ENCOUNTER — Other Ambulatory Visit: Payer: Self-pay | Admitting: Cardiology

## 2021-05-05 NOTE — Telephone Encounter (Signed)
Prescription refill request for Eliquis received. Indication: Afib Last office visit: 12/17/20 ([place])  Scr:1.34 (12/17/20)  Age: 87 Weight: 76.2kg  Appropriate dose and refill sent to requested pharmacy.

## 2021-05-27 ENCOUNTER — Other Ambulatory Visit: Payer: Self-pay | Admitting: Cardiology

## 2021-06-09 ENCOUNTER — Other Ambulatory Visit: Payer: Self-pay | Admitting: Cardiology

## 2021-06-24 NOTE — Progress Notes (Signed)
Arthur [name] Date of Birth: [birth year]   History of Present Illness: [name] is seen for followup of CAD. He has a history of coronary disease and is status post CABG in 2005. He had mitral valve repair and a Maze procedure at that time. He subsequently developed recurrent CVA and TEE demonstrated persistent left atrial appendage thrombus despite a history of previous ligation of the appendage. He has been on chronic anticoagulation.  In April 2016 he presented with a near syncopal episode. He was found to be in atrial fibrillation. Managed with rate control and anticoagulation. Converted back to NSR.    He underwent nuclear stress test in 01/03/2015 revealed a normal ejection fraction however there was a moderate sized, severe intensity, reversible inferior defect consistent with moderate inferior ischemia. Cardiac cath 01/24/15 which showed severe 3v obstructive CAD with patent LIMA-LAD, SVG-diag, SVG-OM3 and occluded SVG-PDA. He had successful stenting of prox RCA with a DES.   He was admitted on 07/11/17 with left sided weakness with right gaze preference. INR subtherapeutic at admission and CTA head neck showed calcified plaque of aorta and carotid bifurcation, right M2 superior division proximal occlusion with poor down stream collateralization and multiple areas of mild to moderate intracranial atherosclerosis. He underwent cerebral angio with complete revascularization of R-ACA with IA tPA and IV tPA.  MRI/MRA brain done revealing acute moderate R-MCA, small right distal ACA infarcts, small right posterior watershed territory infarct with minimal petechial hemorrhage and old large left cerebellar infarct with successful revascularization of R-MCA occlusion. Planned to restart anticoagulation with Eliquis after 7-10 days.   He was admitted to the hospital from 12/8-12/12/19 with urosepsis and E. Coli bacteremia. He had AKI with creatinine up to 1.49. this improved to 1.14 at discharge. He  had Afib with RVR controlled with metoprolol. Continued on Eliquis.   On follow up today he is seen with his wife and CNA.  He is in a wheelchair.  He reports he is doing well.   Denies any Chest pain or edema. No dizziness. No bleeding. No SOB. BP at home is typically 240-973 systolic. He does have some bad dreams at times and wife has to reorient him. Recently he dreamed someone had broken into his shop and he was very stressed about this. BP went up to 193/109 but has since some down to 123/80 at home.     Current Outpatient Medications on File Prior to Visit  Medication Sig Dispense Refill   acetaminophen (TYLENOL) 650 MG CR tablet Take 650 mg by mouth daily as needed (knee pain).      apixaban (ELIQUIS) 5 MG TABS tablet TAKE ONE TABLET BY MOUTH TWICE A DAY 60 tablet 3   DULoxetine (CYMBALTA) 30 MG capsule Take 30 mg by mouth daily.     fluticasone (CUTIVATE) 0.05 % cream Apply topically 2 (two) times daily as needed.     K Phos Mono-Sod Phos Di & Mono (PHOSPHA 250 NEUTRAL) 155-852-130 MG TABS TAKE ONE TABLET BY MOUTH DAILY AFTER BREAKFAST 90 tablet 3   metoprolol tartrate (LOPRESSOR) 25 MG tablet TAKE 1 AND 1/2 TABLET BY MOUTH TWO TIMES A DAY 270 tablet 2   montelukast (SINGULAIR) 10 MG tablet TAKE ONE TABLET BY MOUTH DAILY AFTER BREAKFAST 90 tablet 2   nitroGLYCERIN (NITROSTAT) 0.4 MG SL tablet PLACE 1 TABLET (0.4 MG TOTAL) UNDER THE TONGUE EVERY 5 (FIVE) MINUTESAS NEEDED FOR CHEST PAIN (UP TO 3 DOSES). 25 tablet 2   pantoprazole (PROTONIX) 40 MG  tablet TAKE ONE TABLET BY MOUTH DAILY 60 tablet 1   rosuvastatin (CRESTOR) 20 MG tablet TAKE ONE TABLET BY MOUTH DAILY 90 tablet 1   tiZANidine (ZANAFLEX) 4 MG tablet Take 1 tablet by mouth daily as needed for muscle spasms.     No current facility-administered medications on file prior to visit.    Allergies  Allergen Reactions   Sulfamethoxazole-Trimethoprim     Other reaction(s): Unknown   Tape Other (See Comments)    PATIENT IS TAKING  COUMADIN; HIS SKIN TEARS & BRUISES EASILY; PLEASE USE COBAN WRAP OR AN ALTERNATIVE TO MEDICAL TAPE!!   Lasix [Furosemide] Rash   Penicillins Rash    Has patient had a PCN reaction causing immediate rash, facial/tongue/throat swelling, SOB or lightheadedness with hypotension: Yes Has patient had a PCN reaction causing severe rash involving mucus membranes or skin necrosis: Unknown Has patient had a PCN reaction that required hospitalization: Unknown Has patient had a PCN reaction occurring within the last 10 years: unknown If all of the above answers are "NO", then may proceed with Cephalosporin use.    Sulfa Drugs Cross Reactors Rash    Past Medical History:  Diagnosis Date   Arthritis    "some in my joints" (01/24/2015)   CKD (chronic kidney disease), stage II    Coronary artery disease    a. s/p CABG in 2005 with MV repair and MAZE procedure. b. s/p DES to prox RCA 01/2015.    CVA (cerebrovascular accident) ([place]) ~ 2014   RIGHT BRAIN; denies residual on 01/24/2015   Esophagitis    Distal esophagitis   GERD (gastroesophageal reflux disease)    Heart murmur    History of hiatal hernia    History of recurrent TIAs    Hyperlipidemia    Hypertension    Hypertensive vascular disease    MVP (mitral valve prolapse)    Nephrolithiasis    Odynophagia    PAF (paroxysmal atrial fibrillation) (HCC)    Transudative pleural effusion     Past Surgical History:  Procedure Laterality Date   APPENDECTOMY  1960's   CARDIAC CATHETERIZATION  03/19/2004   CARDIAC CATHETERIZATION N/A 01/24/2015   Procedure: Left Heart Cath and Coronary Angiography;  Surgeon: Nolyn Eilert M [place], MD;  Location: [facility];  Service: Cardiovascular;  Laterality: N/A;   CARDIAC CATHETERIZATION N/A 01/24/2015   Procedure: Coronary Stent Intervention;  Surgeon: Ansen Sayegh M [place], MD;  Location: [facility];  Service: Cardiovascular;  Laterality: N/A;   CORONARY ANGIOPLASTY WITH STENT PLACEMENT  01/24/2015   "1  stent"   CORONARY ARTERY BYPASS GRAFT  04/2004   LIMA GRAFT TO THE DISTAL LAD, SAPHENOUS VEIN GRAFT TO THE FIRST DIADGONAL BRANCH, SAPHENOUS VEIN GRAFT TO THE THIRD MARIGINAL BRANCH, AND SAPHENOUS VEIN GRAFT TO THE PDA   IR ANGIO VERTEBRAL SEL SUBCLAVIAN INNOMINATE UNI R MOD SED  07/11/2017   IR PERCUTANEOUS ART THROMBECTOMY/INFUSION INTRACRANIAL INC DIAG ANGIO  07/11/2017   MAZE  04/2004   MITRAL VALVE REPAIR  04/2004   RADIOLOGY WITH ANESTHESIA N/A 07/11/2017   Procedure: RADIOLOGY WITH ANESTHESIA;  Surgeon: Luanne Bras, MD;  Location: [facility];  Service: Radiology;  Laterality: N/A;   TEE WITHOUT CARDIOVERSION  05/19/2011   Procedure: TRANSESOPHAGEAL ECHOCARDIOGRAM (TEE);  Surgeon: Anaaya Fuster [place], MD;  Location: [facility];  Service: Cardiovascular;  Laterality: N/A;   TONSILLECTOMY AND ADENOIDECTOMY  1944    Social History   Tobacco Use  Smoking Status Former   Years: 3.00  Types: Cigarettes   Quit date: 06/15/1955   Years since quitting: 66.1  Smokeless Tobacco Never    Social History   Substance and Sexual Activity  Alcohol Use No    Family History  Problem Relation Age of Onset   Heart attack Mother    Stroke Father    Hypertension Father    Kidney failure Father     Review of Systems: As noted in history of present illness.  All other systems were reviewed and are negative.  Physical Exam: BP (!) 160/88    Pulse 74    Ht 5\' 7"  (1.702 m)    Wt 174 lb (78.9 kg)    SpO2 97%    BMI 27.25 kg/m  GENERAL:  Well appearing elderly WM in NAD HEENT:  PERRL, EOMI, sclera are clear. Oropharynx is clear. NECK:  No jugular venous distention, carotid upstroke brisk and symmetric, no bruits, no thyromegaly or adenopathy LUNGS:  Clear to auscultation bilaterally CHEST:  Unremarkable HEART:  IRRR,  PMI not displaced or sustained,S1 and S2 within normal limits, no S3, no S4: no clicks, no rubs, no murmurs ABD:  Soft, nontender. BS +, no masses or bruits. No hepatomegaly, no  splenomegaly EXT:  2 + pulses throughout, no edema, left leg in brace. SKIN:  Warm and dry.  No rashes NEURO:  Alert and oriented x 3. Cranial nerves II through XII intact. PSYCH:  Cognitively intact     LABORATORY DATA:   Lab Results  Component Value Date   WBC 7.1 12/17/2020   HGB 15.5 12/17/2020   HCT 48.4 12/17/2020   PLT 300 12/17/2020   GLUCOSE 91 12/17/2020   CHOL 153 12/17/2020   TRIG 98 12/17/2020   HDL 46 12/17/2020   LDLCALC 89 12/17/2020   ALT 15 12/17/2020   AST 18 12/17/2020   NA 138 12/17/2020   K 4.3 12/17/2020   CL 100 12/17/2020   CREATININE 1.34 (H) 12/17/2020   BUN 18 12/17/2020   CO2 24 12/17/2020   TSH 1.650 12/17/2020   INR 1.29 05/21/2018   HGBA1C 5.6 07/13/2017   Dated 03/13/20: cholesterol 145, triglycerides 98, HDL 47, LDL 66. Creatinine 1.32. otherwise CMET, CBC and TSH normal  Echo 07/12/17:Study Conclusions   - Left ventricle: The cavity size was normal. Systolic function was   normal. The estimated ejection fraction was in the range of 55%   to 60%. Wall motion was normal; there were no regional wall   motion abnormalities. Features are consistent with a pseudonormal   left ventricular filling pattern, with concomitant abnormal   relaxation and increased filling pressure (grade 2 diastolic   dysfunction). - Aortic valve: Transvalvular velocity was within the normal range.   There was no stenosis. There was no regurgitation. - Aorta: Ascending aortic diameter: 38 mm (S). - Ascending aorta: The ascending aorta was mildly dilated. - Mitral valve: Prior procedures included surgical repair.   Transvalvular velocity was within the normal range. There was no   evidence for stenosis. There was mild regurgitation. Valve area   by pressure half-time: 2.29 cm^2. - Left atrium: The atrium was moderately dilated. - Right ventricle: The cavity size was normal. Wall thickness was   normal. Systolic function was normal. - Atrial septum: No defect  or patent foramen ovale was identified. - Tricuspid valve: There was mild regurgitation. - Pulmonary arteries: Systolic pressure was severely increased. PA   peak pressure: 56 mm Hg (S).    Assessment / Plan: 1.  CVA recurrent embolic right MCA. Remote TEE documented a left atrial appendage thrombus despite prior ligation of the atrial appendage. He had been on chronic anticoagulation with Eliquis   2. Coronary disease status post CABG in 2005. He also had a Maze procedure at that time.  Cardiac cath in August 2016 showed all grafts were patent except SVG to the RCA that was occluded. He underwent successful stenting of the proximal RCA with DES. He remains  asymptomatic.   3. Atrial fibrillation-persistent/ permanent.  status post Maze procedure. Will continue metoprolol for rate control and Eliquis.    4. Hypercholesterolemia. On chronic Crestor.   5. HTN. Blood pressure is well controlled  Will check lab work today.    Follow up in 6 months.

## 2021-07-08 ENCOUNTER — Encounter: Payer: Self-pay | Admitting: Cardiology

## 2021-07-08 ENCOUNTER — Other Ambulatory Visit: Payer: Self-pay

## 2021-07-08 ENCOUNTER — Ambulatory Visit (INDEPENDENT_AMBULATORY_CARE_PROVIDER_SITE_OTHER): Payer: Medicare Other | Admitting: Cardiology

## 2021-07-08 VITALS — BP 160/88 | HR 74 | Ht 67.0 in | Wt 174.0 lb

## 2021-07-08 DIAGNOSIS — I4821 Permanent atrial fibrillation: Secondary | ICD-10-CM | POA: Diagnosis not present

## 2021-07-08 DIAGNOSIS — E7800 Pure hypercholesterolemia, unspecified: Secondary | ICD-10-CM

## 2021-07-08 DIAGNOSIS — I10 Essential (primary) hypertension: Secondary | ICD-10-CM | POA: Diagnosis not present

## 2021-07-08 DIAGNOSIS — I25810 Atherosclerosis of coronary artery bypass graft(s) without angina pectoris: Secondary | ICD-10-CM | POA: Diagnosis not present

## 2021-07-08 LAB — LIPID PANEL
Chol/HDL Ratio: 3.5 ratio (ref 0.0–5.0)
Cholesterol, Total: 147 mg/dL (ref 100–199)
HDL: 42 mg/dL (ref >39)
LDL Chol Calc (NIH): 82 mg/dL (ref 0–99)
Triglycerides: 126 mg/dL (ref 0–149)
VLDL Cholesterol Cal: 23 mg/dL (ref 5–40)

## 2021-07-08 LAB — BASIC METABOLIC PANEL
BUN/Creatinine Ratio: 13 (ref 10–24)
BUN: 17 mg/dL (ref 10–36)
CO2: 25 mmol/L (ref 20–29)
Calcium: 9.4 mg/dL (ref 8.6–10.2)
Chloride: 103 mmol/L (ref 96–106)
Creatinine, Ser: 1.35 mg/dL — ABNORMAL HIGH (ref 0.76–1.27)
Glucose: 88 mg/dL (ref 70–99)
Potassium: 4.4 mmol/L (ref 3.5–5.2)
Sodium: 139 mmol/L (ref 134–144)
eGFR: 50 mL/min/{1.73_m2} — ABNORMAL LOW (ref >59)

## 2021-07-08 LAB — CBC WITH DIFFERENTIAL/PLATELET
Basophils Absolute: 0.1 10*3/uL (ref 0.0–0.2)
Basos: 1 %
EOS (ABSOLUTE): 0.4 10*3/uL (ref 0.0–0.4)
Eos: 5 %
Hematocrit: 41.5 % (ref 37.5–51.0)
Hemoglobin: 13.6 g/dL (ref 13.0–17.7)
Immature Grans (Abs): 0 10*3/uL (ref 0.0–0.1)
Immature Granulocytes: 0 %
Lymphocytes Absolute: 1.2 10*3/uL (ref 0.7–3.1)
Lymphs: 15 %
MCH: 29.1 pg (ref 26.6–33.0)
MCHC: 32.8 g/dL (ref 31.5–35.7)
MCV: 89 fL (ref 79–97)
Monocytes Absolute: 0.5 10*3/uL (ref 0.1–0.9)
Monocytes: 7 %
Neutrophils Absolute: 5.7 10*3/uL (ref 1.4–7.0)
Neutrophils: 72 %
Platelets: 215 10*3/uL (ref 150–450)
RBC: 4.67 x10E6/uL (ref 4.14–5.80)
RDW: 14.3 % (ref 11.6–15.4)
WBC: 7.9 10*3/uL (ref 3.4–10.8)

## 2021-07-08 LAB — HEPATIC FUNCTION PANEL
ALT: 13 IU/L (ref 0–44)
AST: 20 IU/L (ref 0–40)
Albumin: 4.1 g/dL (ref 3.5–4.6)
Alkaline Phosphatase: 76 IU/L (ref 44–121)
Bilirubin Total: 0.5 mg/dL (ref 0.0–1.2)
Bilirubin, Direct: 0.11 mg/dL (ref 0.00–0.40)
Total Protein: 6.6 g/dL (ref 6.0–8.5)

## 2021-07-28 DIAGNOSIS — X32XXXD Exposure to sunlight, subsequent encounter: Secondary | ICD-10-CM | POA: Diagnosis not present

## 2021-07-28 DIAGNOSIS — C44519 Basal cell carcinoma of skin of other part of trunk: Secondary | ICD-10-CM | POA: Diagnosis not present

## 2021-07-28 DIAGNOSIS — L820 Inflamed seborrheic keratosis: Secondary | ICD-10-CM | POA: Diagnosis not present

## 2021-07-28 DIAGNOSIS — L570 Actinic keratosis: Secondary | ICD-10-CM | POA: Diagnosis not present

## 2021-08-06 ENCOUNTER — Other Ambulatory Visit: Payer: Self-pay | Admitting: Cardiology

## 2021-08-13 ENCOUNTER — Other Ambulatory Visit: Payer: Self-pay | Admitting: Cardiology

## 2021-09-12 ENCOUNTER — Other Ambulatory Visit: Payer: Self-pay | Admitting: Cardiology

## 2021-09-14 NOTE — Telephone Encounter (Signed)
Prescription refill request for Eliquis received. ?Indication:Afib ?Last office visit:1/23 ?Scr:1.3 ?Age: 88 ?Weight:78.9 kg ? ?Prescription refilled ? ?

## 2021-10-01 ENCOUNTER — Other Ambulatory Visit: Payer: Self-pay | Admitting: Cardiology

## 2021-10-20 ENCOUNTER — Telehealth: Payer: Self-pay | Admitting: Cardiology

## 2021-10-20 NOTE — Telephone Encounter (Signed)
Patient's wife is wondering if Dr. Martinique can prescribed another wheelchair for him.  She would like to speak to there nurse about it.  ?

## 2021-10-20 NOTE — Telephone Encounter (Signed)
Contacted patient wife, advised that he is needing another wheelchair- I did advise this is normally through primary care- however she states that she would like if Dr.Jordan would do the order as he knows this patient and his needs, his primary care retired and now they see a NP mostly.  ? ?I advised I would still route a message over to MD to review, and would call her back with recommendations.  ? ?

## 2021-10-26 NOTE — Telephone Encounter (Signed)
Letter mailed to patients home

## 2021-10-26 NOTE — Telephone Encounter (Signed)
Spoke to patient's wife Dr.Jordan advised ok to order a wheelchair. ?

## 2021-11-10 NOTE — Telephone Encounter (Signed)
Wife called stating wheelchair company needs last office note and diagnosis code.  Information placed in the mail

## 2021-11-10 NOTE — Telephone Encounter (Signed)
Wife calling in bout letter that was sent for wheelchair,  company needs more information. They needed the Dx code on the letter as well as progress notes. Please advise

## 2021-11-16 NOTE — Telephone Encounter (Signed)
Wife of the patient called. The wife states nothing has come in the mail yet. She was not sure what to do.

## 2021-11-16 NOTE — Telephone Encounter (Signed)
Attempted to call back - phone rang with no answer  Would like to know if there is a certain place to fax the order or if they can come by to pick up letter with necessary info

## 2021-11-19 NOTE — Telephone Encounter (Signed)
Spoke with pt wife, she has gotten the paperwork.

## 2021-11-21 ENCOUNTER — Other Ambulatory Visit: Payer: Self-pay | Admitting: Cardiology

## 2021-12-19 ENCOUNTER — Other Ambulatory Visit: Payer: Self-pay | Admitting: Cardiology

## 2021-12-30 ENCOUNTER — Other Ambulatory Visit: Payer: Self-pay | Admitting: Cardiology

## 2022-01-06 ENCOUNTER — Other Ambulatory Visit: Payer: Self-pay | Admitting: Cardiology

## 2022-01-06 DIAGNOSIS — I480 Paroxysmal atrial fibrillation: Secondary | ICD-10-CM

## 2022-01-06 NOTE — Progress Notes (Signed)
Arthur [name] Date of Birth: [birth year]   History of Present Illness: Arthur [name] is seen for followup of CAD. He has a history of coronary disease and is status post CABG in 2005. He had mitral valve repair and a Maze procedure at that time. He subsequently developed recurrent CVA and TEE demonstrated persistent left atrial appendage thrombus despite a history of previous ligation of the appendage. He has been on chronic anticoagulation.  In April 2016 he presented with a near syncopal episode. He was found to be in atrial fibrillation. Managed with rate control and anticoagulation. Converted back to NSR.    He underwent nuclear stress test in 01/03/2015 revealed a normal ejection fraction however there was a moderate sized, severe intensity, reversible inferior defect consistent with moderate inferior ischemia. Cardiac cath 01/24/15 which showed severe 3v obstructive CAD with patent LIMA-LAD, SVG-diag, SVG-OM3 and occluded SVG-PDA. He had successful stenting of prox RCA with a DES.   He was admitted on 07/11/17 with left sided weakness with right gaze preference. INR subtherapeutic at admission and CTA head neck showed calcified plaque of aorta and carotid bifurcation, right M2 superior division proximal occlusion with poor down stream collateralization and multiple areas of mild to moderate intracranial atherosclerosis. He underwent cerebral angio with complete revascularization of R-ACA with IA tPA and IV tPA.  MRI/MRA brain done revealing acute moderate R-MCA, small right distal ACA infarcts, small right posterior watershed territory infarct with minimal petechial hemorrhage and old large left cerebellar infarct with successful revascularization of R-MCA occlusion. Planned to restart anticoagulation with Eliquis after 7-10 days.   He was admitted to the hospital from 12/8-12/12/19 with urosepsis and E. Coli bacteremia. He had AKI with creatinine up to 1.49. this improved to 1.14 at discharge. He  had Afib with RVR controlled with metoprolol. Continued on Eliquis.   On follow up today he is seen with his wife and CNA.  He is in a wheelchair.  He reports he is doing well.   Denies any Chest pain or edema. No dizziness. No bleeding. No SOB. BP at home was 147/79 this am. His bad dreams are less frequent.   Current Outpatient Medications on File Prior to Visit  Medication Sig Dispense Refill   acetaminophen (TYLENOL) 650 MG CR tablet Take 650 mg by mouth daily as needed (knee pain).      apixaban (ELIQUIS) 5 MG TABS tablet TAKE ONE TABLET BY MOUTH TWICE A DAY 60 tablet 5   DULoxetine (CYMBALTA) 30 MG capsule Take 30 mg by mouth daily.     fluticasone (CUTIVATE) 0.05 % cream Apply topically 2 (two) times daily as needed.     K Phos Mono-Sod Phos Di & Mono (PHOSPHA 250 NEUTRAL) 155-852-130 MG TABS TAKE 1 TABLET BY MOUTH DAILY AFTER BREAKFAST 90 tablet 3   metoprolol tartrate (LOPRESSOR) 25 MG tablet TAKE 1 AND 1/2 TABLET BY MOUTH TWO TIMES A DAY 270 tablet 2   montelukast (SINGULAIR) 10 MG tablet TAKE ONE TABLET BY MOUTH DAILY AFTER BREAKFAST 90 tablet 2   nitroGLYCERIN (NITROSTAT) 0.4 MG SL tablet PLACE 1 TABLET (0.4 MG TOTAL) UNDER THE TONGUE EVERY 5 (FIVE) MINUTESAS NEEDED FOR CHEST PAIN (UP TO 3 DOSES). 25 tablet 2   pantoprazole (PROTONIX) 40 MG tablet TAKE ONE TABLET BY MOUTH DAILY 90 tablet 0   rosuvastatin (CRESTOR) 20 MG tablet TAKE ONE TABLET BY MOUTH DAILY 90 tablet 1   tiZANidine (ZANAFLEX) 4 MG tablet Take 1 tablet by mouth daily as  needed for muscle spasms.     No current facility-administered medications on file prior to visit.    Allergies  Allergen Reactions   Sulfamethoxazole-Trimethoprim     Other reaction(s): Unknown   Tape Other (See Comments)    PATIENT IS TAKING COUMADIN; HIS SKIN TEARS & BRUISES EASILY; PLEASE USE COBAN WRAP OR AN ALTERNATIVE TO MEDICAL TAPE!!   Lasix [Furosemide] Rash   Penicillins Rash    Has patient had a PCN reaction causing immediate rash,  facial/tongue/throat swelling, SOB or lightheadedness with hypotension: Yes Has patient had a PCN reaction causing severe rash involving mucus membranes or skin necrosis: Unknown Has patient had a PCN reaction that required hospitalization: Unknown Has patient had a PCN reaction occurring within the last 10 years: unknown If all of the above answers are "NO", then may proceed with Cephalosporin use.    Sulfa Drugs Cross Reactors Rash    Past Medical History:  Diagnosis Date   Arthritis    "some in my joints" (01/24/2015)   CKD (chronic kidney disease), stage II    Coronary artery disease    a. s/p CABG in 2005 with MV repair and MAZE procedure. b. s/p DES to prox RCA 01/2015.    CVA (cerebrovascular accident) ([place]) ~ 2014   RIGHT BRAIN; denies residual on 01/24/2015   Esophagitis    Distal esophagitis   GERD (gastroesophageal reflux disease)    Heart murmur    History of hiatal hernia    History of recurrent TIAs    Hyperlipidemia    Hypertension    Hypertensive vascular disease    MVP (mitral valve prolapse)    Nephrolithiasis    Odynophagia    PAF (paroxysmal atrial fibrillation) (HCC)    Transudative pleural effusion     Past Surgical History:  Procedure Laterality Date   APPENDECTOMY  1960's   CARDIAC CATHETERIZATION  03/19/2004   CARDIAC CATHETERIZATION N/A 01/24/2015   Procedure: Left Heart Cath and Coronary Angiography;  Surgeon: Johnisha Louks M Martinique, MD;  Location: [facility];  Service: Cardiovascular;  Laterality: N/A;   CARDIAC CATHETERIZATION N/A 01/24/2015   Procedure: Coronary Stent Intervention;  Surgeon: Morrie Daywalt M [place], MD;  Location: [facility];  Service: Cardiovascular;  Laterality: N/A;   CORONARY ANGIOPLASTY WITH STENT PLACEMENT  01/24/2015   "1 stent"   CORONARY ARTERY BYPASS GRAFT  04/2004   LIMA GRAFT TO THE DISTAL LAD, SAPHENOUS VEIN GRAFT TO THE FIRST DIADGONAL BRANCH, SAPHENOUS VEIN GRAFT TO THE THIRD MARIGINAL BRANCH, AND SAPHENOUS VEIN GRAFT  TO THE PDA   IR ANGIO VERTEBRAL SEL SUBCLAVIAN INNOMINATE UNI R MOD SED  07/11/2017   IR PERCUTANEOUS ART THROMBECTOMY/INFUSION INTRACRANIAL INC DIAG ANGIO  07/11/2017   MAZE  04/2004   MITRAL VALVE REPAIR  04/2004   RADIOLOGY WITH ANESTHESIA N/A 07/11/2017   Procedure: RADIOLOGY WITH ANESTHESIA;  Surgeon: Luanne Bras, MD;  Location: [facility];  Service: Radiology;  Laterality: N/A;   TEE WITHOUT CARDIOVERSION  05/19/2011   Procedure: TRANSESOPHAGEAL ECHOCARDIOGRAM (TEE);  Surgeon: Lizann Edelman [place], MD;  Location: [facility];  Service: Cardiovascular;  Laterality: N/A;   TONSILLECTOMY AND ADENOIDECTOMY  1944    Social History   Tobacco Use  Smoking Status Former   Years: 3.00   Types: Cigarettes   Quit date: 06/15/1955   Years since quitting: 66.6  Smokeless Tobacco Never    Social History   Substance and Sexual Activity  Alcohol Use No    Family History  Problem Relation  Age of Onset   Heart attack Mother    Stroke Father    Hypertension Father    Kidney failure Father     Review of Systems: As noted in history of present illness.  All other systems were reviewed and are negative.  Physical Exam: BP (!) 164/93   Pulse 72   Ht '5\' 7"'$  (1.702 m)   Wt 174 lb (78.9 kg)   SpO2 96%   BMI 27.25 kg/m  GENERAL:  Well appearing elderly WM in NAD HEENT:  PERRL, EOMI, sclera are clear. Oropharynx is clear. NECK:  No jugular venous distention, carotid upstroke brisk and symmetric, no bruits, no thyromegaly or adenopathy LUNGS:  Clear to auscultation bilaterally CHEST:  Unremarkable HEART:  IRRR,  PMI not displaced or sustained,S1 and S2 within normal limits, no S3, no S4: no clicks, no rubs, no murmurs ABD:  Soft, nontender. BS +, no masses or bruits. No hepatomegaly, no splenomegaly EXT:  2 + pulses throughout, no edema, left leg in brace. SKIN:  Warm and dry.  No rashes NEURO:  Alert and oriented x 3. Cranial nerves II through XII intact. Left hemiparesis. PSYCH:  Cognitively  intact     LABORATORY DATA:   Lab Results  Component Value Date   WBC 7.9 07/08/2021   HGB 13.6 07/08/2021   HCT 41.5 07/08/2021   PLT 215 07/08/2021   GLUCOSE 88 07/08/2021   CHOL 147 07/08/2021   TRIG 126 07/08/2021   HDL 42 07/08/2021   LDLCALC 82 07/08/2021   ALT 13 07/08/2021   AST 20 07/08/2021   NA 139 07/08/2021   K 4.4 07/08/2021   CL 103 07/08/2021   CREATININE 1.35 (H) 07/08/2021   BUN 17 07/08/2021   CO2 25 07/08/2021   TSH 1.650 12/17/2020   INR 1.29 05/21/2018   HGBA1C 5.6 07/13/2017   Dated 03/13/20: cholesterol 145, triglycerides 98, HDL 47, LDL 66. Creatinine 1.32. otherwise CMET, CBC and TSH normal  Ecg today shows AFib with rate 72. LAD, low voltage. Old anteroseptal MI. I have personally reviewed and interpreted this study.   Echo 07/12/17:Study Conclusions   - Left ventricle: The cavity size was normal. Systolic function was   normal. The estimated ejection fraction was in the range of 55%   to 60%. Wall motion was normal; there were no regional wall   motion abnormalities. Features are consistent with a pseudonormal   left ventricular filling pattern, with concomitant abnormal   relaxation and increased filling pressure (grade 2 diastolic   dysfunction). - Aortic valve: Transvalvular velocity was within the normal range.   There was no stenosis. There was no regurgitation. - Aorta: Ascending aortic diameter: 38 mm (S). - Ascending aorta: The ascending aorta was mildly dilated. - Mitral valve: Prior procedures included surgical repair.   Transvalvular velocity was within the normal range. There was no   evidence for stenosis. There was mild regurgitation. Valve area   by pressure half-time: 2.29 cm^2. - Left atrium: The atrium was moderately dilated. - Right ventricle: The cavity size was normal. Wall thickness was   normal. Systolic function was normal. - Atrial septum: No defect or patent foramen ovale was identified. - Tricuspid valve:  There was mild regurgitation. - Pulmonary arteries: Systolic pressure was severely increased. PA   peak pressure: 56 mm Hg (S).    Assessment / Plan: 1.  CVA recurrent embolic right MCA. Remote TEE documented a left atrial appendage thrombus despite prior ligation of the atrial  appendage. He had been on chronic anticoagulation with Eliquis   2. Coronary disease status post CABG in 2005. He also had a Maze procedure at that time.  Cardiac cath in August 2016 showed all grafts were patent except SVG to the RCA that was occluded. He underwent successful stenting of the proximal RCA with DES. He remains  asymptomatic.   3. Atrial fibrillation-persistent/ permanent. Rate well controlled. status post Maze procedure. Will continue metoprolol for rate control and Eliquis.    4. Hypercholesterolemia. On chronic Crestor. LDL 82. I am reluctant to increase Crestor further given advanced age.   5. HTN. Blood pressure is fairly well controlled     Follow up in 6 months.

## 2022-01-07 NOTE — Telephone Encounter (Signed)
Prescription refill request for Eliquis received. Indication:Afib  Last office visit:07/08/21 ([place])  Scr: 1.35 (07/08/21) Age: 88 Weight: 78.9kg  Appropriate dose and refill sent to requested pharmacy.

## 2022-01-11 ENCOUNTER — Encounter: Payer: Self-pay | Admitting: Cardiology

## 2022-01-11 ENCOUNTER — Ambulatory Visit (INDEPENDENT_AMBULATORY_CARE_PROVIDER_SITE_OTHER): Payer: Medicare Other | Admitting: Cardiology

## 2022-01-11 VITALS — BP 164/93 | HR 72 | Ht 67.0 in | Wt 174.0 lb

## 2022-01-11 DIAGNOSIS — I4821 Permanent atrial fibrillation: Secondary | ICD-10-CM | POA: Diagnosis not present

## 2022-01-11 DIAGNOSIS — E7800 Pure hypercholesterolemia, unspecified: Secondary | ICD-10-CM | POA: Diagnosis not present

## 2022-01-11 DIAGNOSIS — I25810 Atherosclerosis of coronary artery bypass graft(s) without angina pectoris: Secondary | ICD-10-CM | POA: Diagnosis not present

## 2022-01-11 DIAGNOSIS — I10 Essential (primary) hypertension: Secondary | ICD-10-CM

## 2022-01-27 DIAGNOSIS — I4891 Unspecified atrial fibrillation: Secondary | ICD-10-CM | POA: Diagnosis not present

## 2022-01-27 DIAGNOSIS — K219 Gastro-esophageal reflux disease without esophagitis: Secondary | ICD-10-CM | POA: Diagnosis not present

## 2022-01-27 DIAGNOSIS — E782 Mixed hyperlipidemia: Secondary | ICD-10-CM | POA: Diagnosis not present

## 2022-01-27 DIAGNOSIS — I10 Essential (primary) hypertension: Secondary | ICD-10-CM | POA: Diagnosis not present

## 2022-01-27 DIAGNOSIS — D6869 Other thrombophilia: Secondary | ICD-10-CM | POA: Diagnosis not present

## 2022-01-27 DIAGNOSIS — Z0000 Encounter for general adult medical examination without abnormal findings: Secondary | ICD-10-CM | POA: Diagnosis not present

## 2022-01-27 DIAGNOSIS — H6121 Impacted cerumen, right ear: Secondary | ICD-10-CM | POA: Diagnosis not present

## 2022-01-27 DIAGNOSIS — G8114 Spastic hemiplegia affecting left nondominant side: Secondary | ICD-10-CM | POA: Diagnosis not present

## 2022-01-27 DIAGNOSIS — N1831 Chronic kidney disease, stage 3a: Secondary | ICD-10-CM | POA: Diagnosis not present

## 2022-02-05 DIAGNOSIS — X32XXXD Exposure to sunlight, subsequent encounter: Secondary | ICD-10-CM | POA: Diagnosis not present

## 2022-02-05 DIAGNOSIS — L570 Actinic keratosis: Secondary | ICD-10-CM | POA: Diagnosis not present

## 2022-02-05 DIAGNOSIS — B360 Pityriasis versicolor: Secondary | ICD-10-CM | POA: Diagnosis not present

## 2022-02-05 DIAGNOSIS — D225 Melanocytic nevi of trunk: Secondary | ICD-10-CM | POA: Diagnosis not present

## 2022-03-22 ENCOUNTER — Telehealth: Payer: Self-pay | Admitting: Cardiology

## 2022-03-22 NOTE — Telephone Encounter (Signed)
Pt's wife is requesting call back to get update on paperwork sent there last week. She would like to know when the best time to come and get it would be.

## 2022-03-23 NOTE — Telephone Encounter (Signed)
Follow Up:        Wife is calling back with the fax number is [phone] to [place] application.

## 2022-03-25 NOTE — Telephone Encounter (Signed)
Called Mrs Corigliano.  Informed her the application has been received by Dr Doug Sou nurse.  Wife is aware-awaiting for Dr Martinique signature. She verbalized understanding.

## 2022-03-25 NOTE — Telephone Encounter (Signed)
   Pt's wife said, her CNA dropped off the paperwork yesterday and wanted to make sure Malachy Mood got it

## 2022-03-27 ENCOUNTER — Other Ambulatory Visit: Payer: Self-pay | Admitting: Cardiology

## 2022-03-30 NOTE — Telephone Encounter (Signed)
Spoke to patient's wife advised Dr.Jordan will be in office this Thursday.I will have him sign form then.

## 2022-03-30 NOTE — Telephone Encounter (Signed)
Wife called to check if Dr. Martinique has signed off on the patient's transportation paperwork.

## 2022-04-06 ENCOUNTER — Telehealth: Payer: Self-pay | Admitting: Cardiology

## 2022-04-06 NOTE — Telephone Encounter (Signed)
Spoke to patient's wife Access paper work refaxed to fax # [phone].Copy of paperwork left at [facility] office front desk.

## 2022-04-06 NOTE — Telephone Encounter (Signed)
New Message:      Patient wants to know if you have mailed the documents to her yet? If you have not, she would like to send someone over today to pick it up please.

## 2022-04-06 NOTE — Telephone Encounter (Signed)
See 10/23 telephone note.

## 2022-04-09 DIAGNOSIS — N390 Urinary tract infection, site not specified: Secondary | ICD-10-CM | POA: Diagnosis not present

## 2022-04-27 ENCOUNTER — Telehealth: Payer: Self-pay | Admitting: Cardiology

## 2022-04-27 NOTE — Telephone Encounter (Signed)
Returned call to patient's wife (okay per DPR) who states that patients transportation forms were missing some information but had already been signed by Dr. Martinique. Patients wife states that someone is going to bring the paper work up to the office to see if cheryl can take a look and have the information updated. Advised patient's wife to bring and we will take care of it for her. Patients wife verbalized understanding.

## 2022-04-27 NOTE — Telephone Encounter (Signed)
Pi's wife would like a callback regarding paperwork that needs to drop off . Please advise

## 2022-04-27 NOTE — Telephone Encounter (Signed)
Received transportation form.Form corrected and left message on caregiver's voice mail form left at front office.

## 2022-05-13 ENCOUNTER — Telehealth: Payer: Self-pay | Admitting: Cardiology

## 2022-05-13 DIAGNOSIS — R42 Dizziness and giddiness: Secondary | ICD-10-CM

## 2022-05-13 DIAGNOSIS — I25810 Atherosclerosis of coronary artery bypass graft(s) without angina pectoris: Secondary | ICD-10-CM

## 2022-05-13 DIAGNOSIS — Z8673 Personal history of transient ischemic attack (TIA), and cerebral infarction without residual deficits: Secondary | ICD-10-CM

## 2022-05-13 NOTE — Telephone Encounter (Signed)
Jeanette Caprice is calling from [facility] stating they are needing a prescription faxed with an ICD10 code for the patient to receive a wheelchair. They have already placed the order based off the letter received, but are needing an actual prescription to fill it. The fax number for this is [phone]. Please advise.

## 2022-05-13 NOTE — Telephone Encounter (Signed)
Wheelchair prescription faxed to [facility] at fax # [phone].

## 2022-05-20 ENCOUNTER — Other Ambulatory Visit: Payer: Self-pay | Admitting: Cardiology

## 2022-05-25 NOTE — Progress Notes (Signed)
Arthur [name] Date of Birth: Feb 18, 1929   History of Present Illness: Arthur [name] is seen for followup of CAD. He has a history of coronary disease and is status post CABG in 2005. He had mitral valve repair and a Maze procedure at that time. He subsequently developed recurrent CVA and TEE demonstrated persistent left atrial appendage thrombus despite a history of previous ligation of the appendage. He has been on chronic anticoagulation.  In April 2016 he presented with a near syncopal episode. He was found to be in atrial fibrillation. Managed with rate control and anticoagulation. Converted back to NSR.    He underwent nuclear stress test in 01/03/2015 revealed a normal ejection fraction however there was a moderate sized, severe intensity, reversible inferior defect consistent with moderate inferior ischemia. Cardiac cath 01/24/15 which showed severe 3v obstructive CAD with patent LIMA-LAD, SVG-diag, SVG-OM3 and occluded SVG-PDA. He had successful stenting of prox RCA with a DES.   He was admitted on 07/11/17 with left sided weakness with right gaze preference. INR subtherapeutic at admission and CTA head neck showed calcified plaque of aorta and carotid bifurcation, right M2 superior division proximal occlusion with poor down stream collateralization and multiple areas of mild to moderate intracranial atherosclerosis. He underwent cerebral angio with complete revascularization of R-ACA with IA tPA and IV tPA.  MRI/MRA brain done revealing acute moderate R-MCA, small right distal ACA infarcts, small right posterior watershed territory infarct with minimal petechial hemorrhage and old large left cerebellar infarct with successful revascularization of R-MCA occlusion. Planned to restart anticoagulation with Eliquis after 7-10 days.   He was admitted to the hospital from 12/8-12/12/19 with urosepsis and E. Coli bacteremia. He had AKI with creatinine up to 1.49. this improved to 1.14 at discharge. He  had Afib with RVR controlled with metoprolol. Continued on Eliquis.   On follow up today he is seen with his wife and daughter.  He is in a wheelchair.  He reports he is doing well.   Denies any Chest pain or edema. No dizziness. No bleeding. No SOB. Does note some cough from the back of his throat.  Current Outpatient Medications on File Prior to Visit  Medication Sig Dispense Refill   acetaminophen (TYLENOL) 650 MG CR tablet Take 650 mg by mouth daily as needed (knee pain).      apixaban (ELIQUIS) 5 MG TABS tablet TAKE ONE TABLET BY MOUTH TWICE A DAY 60 tablet 5   DULoxetine (CYMBALTA) 30 MG capsule Take 30 mg by mouth daily.     fluticasone (CUTIVATE) 0.05 % cream Apply topically 2 (two) times daily as needed.     K Phos Mono-Sod Phos Di & Mono (PHOSPHA 250 NEUTRAL) [id number] MG TABS TAKE 1 TABLET BY MOUTH DAILY AFTER BREAKFAST 90 tablet 3   metoprolol tartrate (LOPRESSOR) 25 MG tablet TAKE 1 AND 1/2 TABLET BY MOUTH TWO TIMES A DAY 270 tablet 2   montelukast (SINGULAIR) 10 MG tablet TAKE ONE TABLET BY MOUTH DAILY AFTER BREAKFAST 90 tablet 2   pantoprazole (PROTONIX) 40 MG tablet TAKE 1 TABLET BY MOUTH DAILY 90 tablet 0   rosuvastatin (CRESTOR) 20 MG tablet TAKE 1 TABLET BY MOUTH DAILY 90 tablet 3   tiZANidine (ZANAFLEX) 4 MG tablet Take 1 tablet by mouth daily as needed for muscle spasms.     nitroGLYCERIN (NITROSTAT) 0.4 MG SL tablet PLACE 1 TABLET (0.4 MG TOTAL) UNDER THE TONGUE EVERY 5 (FIVE) MINUTESAS NEEDED FOR CHEST PAIN (UP TO 3 DOSES). 25  tablet 2   No current facility-administered medications on file prior to visit.    Allergies  Allergen Reactions   Sulfamethoxazole-Trimethoprim     Other reaction(s): Unknown   Tape Other (See Comments)    PATIENT IS TAKING COUMADIN; HIS SKIN TEARS & BRUISES EASILY; PLEASE USE COBAN WRAP OR AN ALTERNATIVE TO MEDICAL TAPE!!   Lasix [Furosemide] Rash   Penicillins Rash    Has patient had a PCN reaction causing immediate rash,  facial/tongue/throat swelling, SOB or lightheadedness with hypotension: Yes Has patient had a PCN reaction causing severe rash involving mucus membranes or skin necrosis: Unknown Has patient had a PCN reaction that required hospitalization: Unknown Has patient had a PCN reaction occurring within the last 10 years: unknown If all of the above answers are "NO", then may proceed with Cephalosporin use.    Sulfa Drugs Cross Reactors Rash    Past Medical History:  Diagnosis Date   Arthritis    "some in my joints" (01/24/2015)   CKD (chronic kidney disease), stage II    Coronary artery disease    a. s/p CABG in 2005 with MV repair and MAZE procedure. b. s/p DES to prox RCA 01/2015.    CVA (cerebrovascular accident) ([place]) ~ 2014   RIGHT BRAIN; denies residual on 01/24/2015   Esophagitis    Distal esophagitis   GERD (gastroesophageal reflux disease)    Heart murmur    History of hiatal hernia    History of recurrent TIAs    Hyperlipidemia    Hypertension    Hypertensive vascular disease    MVP (mitral valve prolapse)    Nephrolithiasis    Odynophagia    PAF (paroxysmal atrial fibrillation) (HCC)    Transudative pleural effusion     Past Surgical History:  Procedure Laterality Date   APPENDECTOMY  1960's   CARDIAC CATHETERIZATION  03/19/2004   CARDIAC CATHETERIZATION N/A 01/24/2015   Procedure: Left Heart Cath and Coronary Angiography;  Surgeon: Arthur Kaminsky M Martinique, MD;  Location: [place] [facility];  Service: Cardiovascular;  Laterality: N/A;   CARDIAC CATHETERIZATION N/A 01/24/2015   Procedure: Coronary Stent Intervention;  Surgeon: Arthur Mannor M Martinique, MD;  Location: [place] [facility];  Service: Cardiovascular;  Laterality: N/A;   CORONARY ANGIOPLASTY WITH STENT PLACEMENT  01/24/2015   "1 stent"   CORONARY ARTERY BYPASS GRAFT  04/2004   LIMA GRAFT TO THE DISTAL LAD, SAPHENOUS VEIN GRAFT TO THE FIRST DIADGONAL BRANCH, SAPHENOUS VEIN GRAFT TO THE THIRD MARIGINAL BRANCH, AND SAPHENOUS VEIN GRAFT  TO THE PDA   IR ANGIO VERTEBRAL SEL SUBCLAVIAN INNOMINATE UNI R MOD SED  07/11/2017   IR PERCUTANEOUS ART THROMBECTOMY/INFUSION INTRACRANIAL INC DIAG ANGIO  07/11/2017   MAZE  04/2004   MITRAL VALVE REPAIR  04/2004   RADIOLOGY WITH ANESTHESIA N/A 07/11/2017   Procedure: RADIOLOGY WITH ANESTHESIA;  Surgeon: Luanne Bras, MD;  Location: [place];  Service: Radiology;  Laterality: N/A;   TEE WITHOUT CARDIOVERSION  05/19/2011   Procedure: TRANSESOPHAGEAL ECHOCARDIOGRAM (TEE);  Surgeon: Hazael Olveda Martinique, MD;  Location: [facility];  Service: Cardiovascular;  Laterality: N/A;   TONSILLECTOMY AND ADENOIDECTOMY  1944    Social History   Tobacco Use  Smoking Status Former   Years: 3.00   Types: Cigarettes   Quit date: 06/15/1955   Years since quitting: 62.0  Smokeless Tobacco Never    Social History   Substance and Sexual Activity  Alcohol Use No    Family History  Problem Relation Age of Onset  Heart attack Mother    Stroke Father    Hypertension Father    Kidney failure Father     Review of Systems: As noted in history of present illness.  All other systems were reviewed and are negative.  Physical Exam: BP (!) 144/87   Pulse 73   Ht '5\' 7"'$  (1.702 m)   Wt 172 lb (78 kg)   BMI 26.94 kg/m  GENERAL:  Well appearing elderly WM in NAD HEENT:  PERRL, EOMI, sclera are clear. Oropharynx is clear. NECK:  No jugular venous distention, carotid upstroke brisk and symmetric, no bruits, no thyromegaly or adenopathy LUNGS:  Clear to auscultation bilaterally CHEST:  Unremarkable HEART:  IRRR,  PMI not displaced or sustained,S1 and S2 within normal limits, no S3, no S4: no clicks, no rubs, no murmurs ABD:  Soft, nontender. BS +, no masses or bruits. No hepatomegaly, no splenomegaly EXT:  2 + pulses throughout, no edema, left leg in brace. SKIN:  Warm and dry.  No rashes NEURO:  Alert and oriented x 3. Cranial nerves II through XII intact. Left hemiparesis. PSYCH:  Cognitively  intact     LABORATORY DATA:   Lab Results  Component Value Date   WBC 7.9 07/08/2021   HGB 13.6 07/08/2021   HCT 41.5 07/08/2021   PLT 215 07/08/2021   GLUCOSE 88 07/08/2021   CHOL 147 07/08/2021   TRIG 126 07/08/2021   HDL 42 07/08/2021   LDLCALC 82 07/08/2021   ALT 13 07/08/2021   AST 20 07/08/2021   NA 139 07/08/2021   K 4.4 07/08/2021   CL 103 07/08/2021   CREATININE 1.35 (H) 07/08/2021   BUN 17 07/08/2021   CO2 25 07/08/2021   TSH 1.650 12/17/2020   INR 1.29 05/21/2018   HGBA1C 5.6 07/13/2017   Dated 03/13/20: cholesterol 145, triglycerides 98, HDL 47, LDL 66. Creatinine 1.32. otherwise CMET, CBC and TSH normal Dated 01/27/22: creatinine 1.41. otherwise CBC and CMET normal.  Ecg not done today    Echo 07/12/17:Study Conclusions   - Left ventricle: The cavity size was normal. Systolic function was   normal. The estimated ejection fraction was in the range of 55%   to 60%. Wall motion was normal; there were no regional wall   motion abnormalities. Features are consistent with a pseudonormal   left ventricular filling pattern, with concomitant abnormal   relaxation and increased filling pressure (grade 2 diastolic   dysfunction). - Aortic valve: Transvalvular velocity was within the normal range.   There was no stenosis. There was no regurgitation. - Aorta: Ascending aortic diameter: 38 mm (S). - Ascending aorta: The ascending aorta was mildly dilated. - Mitral valve: Prior procedures included surgical repair.   Transvalvular velocity was within the normal range. There was no   evidence for stenosis. There was mild regurgitation. Valve area   by pressure half-time: 2.29 cm^2. - Left atrium: The atrium was moderately dilated. - Right ventricle: The cavity size was normal. Wall thickness was   normal. Systolic function was normal. - Atrial septum: No defect or patent foramen ovale was identified. - Tricuspid valve: There was mild regurgitation. - Pulmonary  arteries: Systolic pressure was severely increased. PA   peak pressure: 56 mm Hg (S).    Assessment / Plan: 1.  CVA recurrent embolic right MCA. Remote TEE documented a left atrial appendage thrombus despite prior ligation of the atrial appendage. He had been on chronic anticoagulation with Eliquis   2. Coronary disease status post  CABG in 2005. He also had a Maze procedure at that time.  Cardiac cath in August 2016 showed all grafts were patent except SVG to the RCA that was occluded. He underwent successful stenting of the proximal native  RCA with DES. He remains  asymptomatic.   3. Atrial fibrillation-persistent/ permanent. Rate well controlled. status post Maze procedure. Will continue metoprolol for rate control and Eliquis.    4. Hypercholesterolemia. On chronic Crestor. LDL 82 given advanced age have opted not to increase therapy.   5. HTN. Blood pressure is fairly well controlled     Follow up in 6 months.

## 2022-05-31 ENCOUNTER — Encounter: Payer: Self-pay | Admitting: Cardiology

## 2022-05-31 ENCOUNTER — Ambulatory Visit: Payer: Medicare Other | Attending: Cardiology | Admitting: Cardiology

## 2022-05-31 VITALS — BP 144/87 | HR 73 | Ht 67.0 in | Wt 172.0 lb

## 2022-05-31 DIAGNOSIS — Z8673 Personal history of transient ischemic attack (TIA), and cerebral infarction without residual deficits: Secondary | ICD-10-CM | POA: Insufficient documentation

## 2022-05-31 DIAGNOSIS — I25810 Atherosclerosis of coronary artery bypass graft(s) without angina pectoris: Secondary | ICD-10-CM | POA: Insufficient documentation

## 2022-05-31 DIAGNOSIS — I10 Essential (primary) hypertension: Secondary | ICD-10-CM | POA: Diagnosis not present

## 2022-05-31 DIAGNOSIS — I4811 Longstanding persistent atrial fibrillation: Secondary | ICD-10-CM | POA: Insufficient documentation

## 2022-06-04 ENCOUNTER — Other Ambulatory Visit: Payer: Self-pay | Admitting: Urology

## 2022-06-04 ENCOUNTER — Encounter (HOSPITAL_COMMUNITY)
Admission: EM | Disposition: A | Discharge status: Skilled Nursing Facility | Payer: Self-pay | Source: Home / Self Care | Attending: Internal Medicine

## 2022-06-04 ENCOUNTER — Other Ambulatory Visit: Payer: Self-pay

## 2022-06-04 ENCOUNTER — Emergency Department (HOSPITAL_COMMUNITY): Payer: Medicare Other

## 2022-06-04 ENCOUNTER — Encounter (HOSPITAL_COMMUNITY): Payer: Self-pay

## 2022-06-04 ENCOUNTER — Inpatient Hospital Stay (HOSPITAL_COMMUNITY): Payer: Medicare Other | Admitting: Anesthesiology

## 2022-06-04 ENCOUNTER — Inpatient Hospital Stay (HOSPITAL_COMMUNITY): Payer: Medicare Other

## 2022-06-04 ENCOUNTER — Inpatient Hospital Stay (HOSPITAL_COMMUNITY)
Admission: EM | Admit: 2022-06-04 | Discharge: 2022-06-10 | DRG: 660 | Disposition: A | Discharge status: Skilled Nursing Facility | Payer: Medicare Other | Attending: Internal Medicine | Admitting: Internal Medicine

## 2022-06-04 DIAGNOSIS — Z87891 Personal history of nicotine dependence: Secondary | ICD-10-CM

## 2022-06-04 DIAGNOSIS — Z66 Do not resuscitate: Secondary | ICD-10-CM | POA: Diagnosis present

## 2022-06-04 DIAGNOSIS — I4819 Other persistent atrial fibrillation: Secondary | ICD-10-CM | POA: Diagnosis present

## 2022-06-04 DIAGNOSIS — N3001 Acute cystitis with hematuria: Secondary | ICD-10-CM

## 2022-06-04 DIAGNOSIS — Z841 Family history of disorders of kidney and ureter: Secondary | ICD-10-CM

## 2022-06-04 DIAGNOSIS — R2689 Other abnormalities of gait and mobility: Secondary | ICD-10-CM | POA: Diagnosis not present

## 2022-06-04 DIAGNOSIS — Z823 Family history of stroke: Secondary | ICD-10-CM | POA: Diagnosis not present

## 2022-06-04 DIAGNOSIS — I69828 Other speech and language deficits following other cerebrovascular disease: Secondary | ICD-10-CM | POA: Diagnosis not present

## 2022-06-04 DIAGNOSIS — Z955 Presence of coronary angioplasty implant and graft: Secondary | ICD-10-CM | POA: Diagnosis not present

## 2022-06-04 DIAGNOSIS — I69354 Hemiplegia and hemiparesis following cerebral infarction affecting left non-dominant side: Secondary | ICD-10-CM

## 2022-06-04 DIAGNOSIS — Z87442 Personal history of urinary calculi: Secondary | ICD-10-CM | POA: Diagnosis not present

## 2022-06-04 DIAGNOSIS — N289 Disorder of kidney and ureter, unspecified: Secondary | ICD-10-CM

## 2022-06-04 DIAGNOSIS — I6930 Unspecified sequelae of cerebral infarction: Secondary | ICD-10-CM

## 2022-06-04 DIAGNOSIS — R4182 Altered mental status, unspecified: Secondary | ICD-10-CM | POA: Diagnosis not present

## 2022-06-04 DIAGNOSIS — N202 Calculus of kidney with calculus of ureter: Secondary | ICD-10-CM | POA: Diagnosis present

## 2022-06-04 DIAGNOSIS — Z7401 Bed confinement status: Secondary | ICD-10-CM | POA: Diagnosis not present

## 2022-06-04 DIAGNOSIS — I2510 Atherosclerotic heart disease of native coronary artery without angina pectoris: Secondary | ICD-10-CM

## 2022-06-04 DIAGNOSIS — Z8249 Family history of ischemic heart disease and other diseases of the circulatory system: Secondary | ICD-10-CM

## 2022-06-04 DIAGNOSIS — E7800 Pure hypercholesterolemia, unspecified: Secondary | ICD-10-CM

## 2022-06-04 DIAGNOSIS — N10 Acute tubulo-interstitial nephritis: Secondary | ICD-10-CM | POA: Diagnosis not present

## 2022-06-04 DIAGNOSIS — R053 Chronic cough: Secondary | ICD-10-CM | POA: Diagnosis present

## 2022-06-04 DIAGNOSIS — Z79899 Other long term (current) drug therapy: Secondary | ICD-10-CM

## 2022-06-04 DIAGNOSIS — I69891 Dysphagia following other cerebrovascular disease: Secondary | ICD-10-CM | POA: Diagnosis not present

## 2022-06-04 DIAGNOSIS — I341 Nonrheumatic mitral (valve) prolapse: Secondary | ICD-10-CM | POA: Diagnosis present

## 2022-06-04 DIAGNOSIS — R109 Unspecified abdominal pain: Secondary | ICD-10-CM | POA: Diagnosis not present

## 2022-06-04 DIAGNOSIS — Z888 Allergy status to other drugs, medicaments and biological substances status: Secondary | ICD-10-CM

## 2022-06-04 DIAGNOSIS — N179 Acute kidney failure, unspecified: Secondary | ICD-10-CM | POA: Diagnosis present

## 2022-06-04 DIAGNOSIS — Z20822 Contact with and (suspected) exposure to covid-19: Secondary | ICD-10-CM | POA: Diagnosis present

## 2022-06-04 DIAGNOSIS — N1832 Chronic kidney disease, stage 3b: Secondary | ICD-10-CM | POA: Diagnosis present

## 2022-06-04 DIAGNOSIS — Z9109 Other allergy status, other than to drugs and biological substances: Secondary | ICD-10-CM

## 2022-06-04 DIAGNOSIS — I10 Essential (primary) hypertension: Secondary | ICD-10-CM | POA: Diagnosis not present

## 2022-06-04 DIAGNOSIS — Z7901 Long term (current) use of anticoagulants: Secondary | ICD-10-CM | POA: Diagnosis not present

## 2022-06-04 DIAGNOSIS — E785 Hyperlipidemia, unspecified: Secondary | ICD-10-CM | POA: Diagnosis present

## 2022-06-04 DIAGNOSIS — R031 Nonspecific low blood-pressure reading: Secondary | ICD-10-CM | POA: Diagnosis present

## 2022-06-04 DIAGNOSIS — N200 Calculus of kidney: Secondary | ICD-10-CM | POA: Diagnosis not present

## 2022-06-04 DIAGNOSIS — Z951 Presence of aortocoronary bypass graft: Secondary | ICD-10-CM

## 2022-06-04 DIAGNOSIS — Z882 Allergy status to sulfonamides status: Secondary | ICD-10-CM

## 2022-06-04 DIAGNOSIS — R531 Weakness: Secondary | ICD-10-CM | POA: Diagnosis not present

## 2022-06-04 DIAGNOSIS — I700 Atherosclerosis of aorta: Secondary | ICD-10-CM | POA: Diagnosis not present

## 2022-06-04 DIAGNOSIS — I959 Hypotension, unspecified: Secondary | ICD-10-CM | POA: Diagnosis not present

## 2022-06-04 DIAGNOSIS — R1312 Dysphagia, oropharyngeal phase: Secondary | ICD-10-CM | POA: Diagnosis not present

## 2022-06-04 DIAGNOSIS — L89322 Pressure ulcer of left buttock, stage 2: Secondary | ICD-10-CM | POA: Diagnosis not present

## 2022-06-04 DIAGNOSIS — Z880 Allergy status to penicillin: Secondary | ICD-10-CM

## 2022-06-04 DIAGNOSIS — N201 Calculus of ureter: Secondary | ICD-10-CM | POA: Diagnosis not present

## 2022-06-04 DIAGNOSIS — M545 Low back pain, unspecified: Secondary | ICD-10-CM | POA: Diagnosis not present

## 2022-06-04 DIAGNOSIS — I129 Hypertensive chronic kidney disease with stage 1 through stage 4 chronic kidney disease, or unspecified chronic kidney disease: Secondary | ICD-10-CM | POA: Diagnosis present

## 2022-06-04 DIAGNOSIS — N390 Urinary tract infection, site not specified: Secondary | ICD-10-CM | POA: Diagnosis present

## 2022-06-04 DIAGNOSIS — E876 Hypokalemia: Secondary | ICD-10-CM | POA: Diagnosis present

## 2022-06-04 DIAGNOSIS — M6281 Muscle weakness (generalized): Secondary | ICD-10-CM | POA: Diagnosis not present

## 2022-06-04 DIAGNOSIS — R2681 Unsteadiness on feet: Secondary | ICD-10-CM | POA: Diagnosis not present

## 2022-06-04 DIAGNOSIS — M549 Dorsalgia, unspecified: Secondary | ICD-10-CM | POA: Diagnosis not present

## 2022-06-04 DIAGNOSIS — R5381 Other malaise: Secondary | ICD-10-CM | POA: Diagnosis present

## 2022-06-04 DIAGNOSIS — N132 Hydronephrosis with renal and ureteral calculous obstruction: Secondary | ICD-10-CM | POA: Diagnosis not present

## 2022-06-04 DIAGNOSIS — N400 Enlarged prostate without lower urinary tract symptoms: Secondary | ICD-10-CM | POA: Diagnosis present

## 2022-06-04 DIAGNOSIS — M1990 Unspecified osteoarthritis, unspecified site: Secondary | ICD-10-CM | POA: Diagnosis present

## 2022-06-04 DIAGNOSIS — I25810 Atherosclerosis of coronary artery bypass graft(s) without angina pectoris: Secondary | ICD-10-CM | POA: Diagnosis not present

## 2022-06-04 DIAGNOSIS — B9620 Unspecified Escherichia coli [E. coli] as the cause of diseases classified elsewhere: Secondary | ICD-10-CM | POA: Diagnosis present

## 2022-06-04 DIAGNOSIS — I69322 Dysarthria following cerebral infarction: Secondary | ICD-10-CM | POA: Diagnosis not present

## 2022-06-04 DIAGNOSIS — K219 Gastro-esophageal reflux disease without esophagitis: Secondary | ICD-10-CM

## 2022-06-04 DIAGNOSIS — R319 Hematuria, unspecified: Secondary | ICD-10-CM | POA: Diagnosis not present

## 2022-06-04 DIAGNOSIS — R011 Cardiac murmur, unspecified: Secondary | ICD-10-CM | POA: Diagnosis present

## 2022-06-04 HISTORY — PX: CYSTOSCOPY W/ URETERAL STENT PLACEMENT: SHX1429

## 2022-06-04 LAB — URINALYSIS, ROUTINE W REFLEX MICROSCOPIC
Bilirubin Urine: NEGATIVE
Glucose, UA: NEGATIVE mg/dL
Ketones, ur: NEGATIVE mg/dL
Nitrite: POSITIVE — AB
Protein, ur: 100 mg/dL — AB
RBC / HPF: >50 RBC/hpf — ABNORMAL HIGH (ref 0–5)
Specific Gravity, Urine: 1.024 (ref 1.005–1.030)
WBC, UA: >50 WBC/hpf — ABNORMAL HIGH (ref 0–5)
pH: 5 (ref 5.0–8.0)

## 2022-06-04 LAB — BASIC METABOLIC PANEL
Anion gap: 13 (ref 5–15)
BUN: 18 mg/dL (ref 8–23)
CO2: 22 mmol/L (ref 22–32)
Calcium: 9.4 mg/dL (ref 8.9–10.3)
Chloride: 104 mmol/L (ref 98–111)
Creatinine, Ser: 1.61 mg/dL — ABNORMAL HIGH (ref 0.61–1.24)
GFR, Estimated: 40 mL/min — ABNORMAL LOW (ref >60)
Glucose, Bld: 110 mg/dL — ABNORMAL HIGH (ref 70–99)
Potassium: 4 mmol/L (ref 3.5–5.1)
Sodium: 139 mmol/L (ref 135–145)

## 2022-06-04 LAB — CBC WITH DIFFERENTIAL/PLATELET
Abs Immature Granulocytes: 0.04 10*3/uL (ref 0.00–0.07)
Basophils Absolute: 0 10*3/uL (ref 0.0–0.1)
Basophils Relative: 0 %
Eosinophils Absolute: 0.1 10*3/uL (ref 0.0–0.5)
Eosinophils Relative: 1 %
HCT: 41.4 % (ref 39.0–52.0)
Hemoglobin: 13.9 g/dL (ref 13.0–17.0)
Immature Granulocytes: 0 %
Lymphocytes Relative: 4 %
Lymphs Abs: 0.4 10*3/uL — ABNORMAL LOW (ref 0.7–4.0)
MCH: 30 pg (ref 26.0–34.0)
MCHC: 33.6 g/dL (ref 30.0–36.0)
MCV: 89.4 fL (ref 80.0–100.0)
Monocytes Absolute: 0.1 10*3/uL (ref 0.1–1.0)
Monocytes Relative: 1 %
Neutro Abs: 9.3 10*3/uL — ABNORMAL HIGH (ref 1.7–7.7)
Neutrophils Relative %: 94 %
Platelets: 243 10*3/uL (ref 150–400)
RBC: 4.63 MIL/uL (ref 4.22–5.81)
RDW: 14.6 % (ref 11.5–15.5)
WBC: 10 10*3/uL (ref 4.0–10.5)
nRBC: 0 % (ref 0.0–0.2)

## 2022-06-04 LAB — LACTIC ACID, PLASMA
Lactic Acid, Venous: 1.4 mmol/L (ref 0.5–1.9)
Lactic Acid, Venous: 1.8 mmol/L (ref 0.5–1.9)

## 2022-06-04 LAB — RESP PANEL BY RT-PCR (RSV, FLU A&B, COVID)  RVPGX2
Influenza A by PCR: NEGATIVE
Influenza B by PCR: NEGATIVE
Resp Syncytial Virus by PCR: NEGATIVE
SARS Coronavirus 2 by RT PCR: NEGATIVE

## 2022-06-04 SURGERY — CYSTOSCOPY, WITH RETROGRADE PYELOGRAM AND URETERAL STENT INSERTION
Anesthesia: General | Laterality: Left

## 2022-06-04 MED ORDER — ALBUMIN HUMAN 5 % IV SOLN
12.5000 g | Freq: Once | INTRAVENOUS | Status: AC
  Administered 2022-06-04: 12.5 g via INTRAVENOUS

## 2022-06-04 MED ORDER — ROSUVASTATIN CALCIUM 20 MG PO TABS
20.0000 mg | ORAL_TABLET | Freq: Every day | ORAL | Status: DC
  Administered 2022-06-04 – 2022-06-10 (×7): 20 mg via ORAL
  Filled 2022-06-04 (×7): qty 1

## 2022-06-04 MED ORDER — K PHOS MONO-SOD PHOS DI & MONO 155-852-130 MG PO TABS
1.0000 | ORAL_TABLET | Freq: Every day | ORAL | Status: DC
  Administered 2022-06-05 – 2022-06-10 (×6): 1 via ORAL
  Filled 2022-06-04 (×6): qty 1

## 2022-06-04 MED ORDER — SODIUM CHLORIDE 0.9 % IV BOLUS
500.0000 mL | Freq: Once | INTRAVENOUS | Status: AC
  Administered 2022-06-04: 500 mL via INTRAVENOUS

## 2022-06-04 MED ORDER — LIDOCAINE 2% (20 MG/ML) 5 ML SYRINGE
INTRAMUSCULAR | Status: DC | PRN
  Administered 2022-06-04: 60 mg via INTRAVENOUS

## 2022-06-04 MED ORDER — CIPROFLOXACIN IN D5W 400 MG/200ML IV SOLN
400.0000 mg | INTRAVENOUS | Status: DC
  Filled 2022-06-04: qty 200

## 2022-06-04 MED ORDER — PHENYLEPHRINE HCL-NACL 20-0.9 MG/250ML-% IV SOLN
INTRAVENOUS | Status: DC | PRN
  Administered 2022-06-04: 50 ug/min via INTRAVENOUS

## 2022-06-04 MED ORDER — ONDANSETRON HCL 4 MG PO TABS: 4.0000 mg | ORAL_TABLET | Freq: Four times a day (QID) | ORAL | Status: DC | PRN

## 2022-06-04 MED ORDER — SUGAMMADEX SODIUM 200 MG/2ML IV SOLN
INTRAVENOUS | Status: DC | PRN
  Administered 2022-06-04: 200 mg via INTRAVENOUS

## 2022-06-04 MED ORDER — MONTELUKAST SODIUM 10 MG PO TABS
10.0000 mg | ORAL_TABLET | Freq: Every day | ORAL | Status: DC
  Administered 2022-06-05 – 2022-06-10 (×6): 10 mg via ORAL
  Filled 2022-06-04 (×6): qty 1

## 2022-06-04 MED ORDER — IPRATROPIUM-ALBUTEROL 0.5-2.5 (3) MG/3ML IN SOLN
RESPIRATORY_TRACT | Status: AC
  Filled 2022-06-04: qty 3

## 2022-06-04 MED ORDER — METOPROLOL TARTRATE 12.5 MG HALF TABLET
ORAL_TABLET | ORAL | Status: AC
  Filled 2022-06-04: qty 3

## 2022-06-04 MED ORDER — SODIUM CHLORIDE 0.9 % IV SOLN
1.0000 g | INTRAVENOUS | Status: DC
  Administered 2022-06-04 – 2022-06-08 (×5): 1 g via INTRAVENOUS
  Filled 2022-06-04 (×5): qty 10

## 2022-06-04 MED ORDER — FENTANYL CITRATE (PF) 250 MCG/5ML IJ SOLN
INTRAMUSCULAR | Status: AC
  Filled 2022-06-04: qty 5

## 2022-06-04 MED ORDER — PROPOFOL 10 MG/ML IV BOLUS
INTRAVENOUS | Status: AC
  Filled 2022-06-04: qty 20

## 2022-06-04 MED ORDER — IPRATROPIUM-ALBUTEROL 0.5-2.5 (3) MG/3ML IN SOLN
3.0000 mL | Freq: Once | RESPIRATORY_TRACT | Status: AC
  Administered 2022-06-04: 3 mL via RESPIRATORY_TRACT

## 2022-06-04 MED ORDER — ALBUMIN HUMAN 5 % IV SOLN
INTRAVENOUS | Status: AC
  Filled 2022-06-04: qty 500

## 2022-06-04 MED ORDER — ROCURONIUM BROMIDE 100 MG/10ML IV SOLN
INTRAVENOUS | Status: DC | PRN
  Administered 2022-06-04: 40 mg via INTRAVENOUS

## 2022-06-04 MED ORDER — ACETAMINOPHEN 650 MG RE SUPP: 650.0000 mg | Freq: Four times a day (QID) | RECTAL | Status: DC | PRN

## 2022-06-04 MED ORDER — METOPROLOL TARTRATE 25 MG PO TABS
35.0000 mg | ORAL_TABLET | Freq: Once | ORAL | Status: AC
  Administered 2022-06-04: 37.5 mg via ORAL
  Filled 2022-06-04: qty 1

## 2022-06-04 MED ORDER — ACETAMINOPHEN 325 MG PO TABS
650.0000 mg | ORAL_TABLET | Freq: Four times a day (QID) | ORAL | Status: DC | PRN
  Administered 2022-06-04 – 2022-06-06 (×5): 650 mg via ORAL
  Filled 2022-06-04 (×6): qty 2

## 2022-06-04 MED ORDER — CHLORHEXIDINE GLUCONATE 0.12 % MT SOLN
15.0000 mL | Freq: Once | OROMUCOSAL | Status: AC
  Administered 2022-06-04: 15 mL via OROMUCOSAL
  Filled 2022-06-04: qty 15

## 2022-06-04 MED ORDER — 0.9 % SODIUM CHLORIDE (POUR BTL) OPTIME
TOPICAL | Status: DC | PRN
  Administered 2022-06-04 (×2): 1000 mL

## 2022-06-04 MED ORDER — ACETAMINOPHEN 500 MG PO TABS
1000.0000 mg | ORAL_TABLET | Freq: Once | ORAL | Status: AC
  Administered 2022-06-04: 1000 mg via ORAL
  Filled 2022-06-04: qty 2

## 2022-06-04 MED ORDER — IOHEXOL 300 MG/ML  SOLN
INTRAMUSCULAR | Status: DC | PRN
  Administered 2022-06-04: 5 mL via URETHRAL

## 2022-06-04 MED ORDER — ONDANSETRON HCL 4 MG/2ML IJ SOLN
INTRAMUSCULAR | Status: DC | PRN
  Administered 2022-06-04: 4 mg via INTRAVENOUS

## 2022-06-04 MED ORDER — FENTANYL CITRATE (PF) 100 MCG/2ML IJ SOLN: 25.0000 ug | INTRAMUSCULAR | Status: DC | PRN

## 2022-06-04 MED ORDER — ENOXAPARIN SODIUM 40 MG/0.4ML IJ SOSY: 40.0000 mg | PREFILLED_SYRINGE | INTRAMUSCULAR | Status: DC

## 2022-06-04 MED ORDER — PROPOFOL 10 MG/ML IV BOLUS
INTRAVENOUS | Status: DC | PRN
  Administered 2022-06-04: 30 mg via INTRAVENOUS
  Administered 2022-06-04: 100 mg via INTRAVENOUS

## 2022-06-04 MED ORDER — LACTATED RINGERS IV SOLN: INTRAVENOUS | Status: DC

## 2022-06-04 MED ORDER — ONDANSETRON HCL 4 MG/2ML IJ SOLN: 4.0000 mg | Freq: Four times a day (QID) | INTRAMUSCULAR | Status: DC | PRN

## 2022-06-04 MED ORDER — ORAL CARE MOUTH RINSE: 15.0000 mL | Freq: Once | OROMUCOSAL | Status: AC

## 2022-06-04 MED ORDER — IOPAMIDOL (ISOVUE-370) INJECTION 76%
75.0000 mL | Freq: Once | INTRAVENOUS | Status: AC | PRN
  Administered 2022-06-04: 75 mL via INTRAVENOUS

## 2022-06-04 MED ORDER — PANTOPRAZOLE SODIUM 40 MG PO TBEC
40.0000 mg | DELAYED_RELEASE_TABLET | Freq: Every day | ORAL | Status: DC
  Administered 2022-06-04 – 2022-06-10 (×7): 40 mg via ORAL
  Filled 2022-06-04 (×7): qty 1

## 2022-06-04 MED ORDER — ALBUTEROL SULFATE (2.5 MG/3ML) 0.083% IN NEBU: 2.5000 mg | INHALATION_SOLUTION | Freq: Four times a day (QID) | RESPIRATORY_TRACT | Status: DC | PRN

## 2022-06-04 MED ORDER — LIDOCAINE HCL URETHRAL/MUCOSAL 2 % EX GEL
CUTANEOUS | Status: AC
  Filled 2022-06-04: qty 11

## 2022-06-04 MED ORDER — WATER FOR IRRIGATION, STERILE IR SOLN
Status: DC | PRN
  Administered 2022-06-04: 1000 mL

## 2022-06-04 MED ORDER — DULOXETINE HCL 30 MG PO CPEP
30.0000 mg | ORAL_CAPSULE | Freq: Every day | ORAL | Status: DC
  Administered 2022-06-04 – 2022-06-10 (×7): 30 mg via ORAL
  Filled 2022-06-04 (×7): qty 1

## 2022-06-04 SURGICAL SUPPLY — 25 items
BAG DRN RND TRDRP ANRFLXCHMBR (UROLOGICAL SUPPLIES) ×1
BAG URINE DRAIN 2000ML AR STRL (UROLOGICAL SUPPLIES) ×1 IMPLANT
BAG URO CATCHER STRL LF (MISCELLANEOUS) ×1 IMPLANT
CATH COUDE FOLEY 2W 5CC 20FR (CATHETERS) IMPLANT
CATH FOLEY 2WAY SLVR  5CC 16FR (CATHETERS)
CATH FOLEY 2WAY SLVR 5CC 16FR (CATHETERS) IMPLANT
CATH URET 5FR 28IN OPEN ENDED (CATHETERS) IMPLANT
CATH URETL OPEN END 6FR 70 (CATHETERS) ×1 IMPLANT
GLOVE BIOGEL M 8.0 STRL (GLOVE) ×1 IMPLANT
GOWN STRL REUS W/ TWL LRG LVL3 (GOWN DISPOSABLE) ×1 IMPLANT
GOWN STRL REUS W/ TWL XL LVL3 (GOWN DISPOSABLE) ×1 IMPLANT
GOWN STRL REUS W/TWL LRG LVL3 (GOWN DISPOSABLE) ×1
GOWN STRL REUS W/TWL XL LVL3 (GOWN DISPOSABLE) ×1
GUIDEWIRE ANG ZIPWIRE 038X150 (WIRE) IMPLANT
GUIDEWIRE STR DUAL SENSOR (WIRE) ×1 IMPLANT
KIT TURNOVER KIT B (KITS) ×1 IMPLANT
MANIFOLD NEPTUNE II (INSTRUMENTS) IMPLANT
NS IRRIG 1000ML POUR BTL (IV SOLUTION) IMPLANT
PACK CYSTO (CUSTOM PROCEDURE TRAY) ×1 IMPLANT
STENT URET 6FRX24 CONTOUR (STENTS) IMPLANT
STENT URET 6FRX26 CONTOUR (STENTS) IMPLANT
SYPHON OMNI JUG (MISCELLANEOUS) ×1 IMPLANT
TOWEL GREEN STERILE FF (TOWEL DISPOSABLE) ×1 IMPLANT
TUBE CONNECTING 12X1/4 (SUCTIONS) IMPLANT
WATER STERILE IRR 3000ML UROMA (IV SOLUTION) ×1 IMPLANT

## 2022-06-04 NOTE — ED Provider Notes (Addendum)
[facility] EMERGENCY DEPARTMENT Provider Note   CSN: [id number] Arrival date & time: 06/04/22  0524     History  Chief Complaint  Patient presents with   Back Pain    Arthur [name] is a 88 y.o. male.  Patient is a 88 year old male who presents with back pain.  On chart review, he has a history of coronary artery disease status post bypass surgery in 2005.  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  He has a history of hypertension, hyperlipidemia and prior stroke with left-sided weakness.  He reports that he came into the ED for back pain.  He says it started today and was in his lower back.  Although he denies any current back pain.  He denies any recent falls or trauma.  He denies any weakness in his legs other than his chronic weakness to his left side.  Overall he looks very weak.  He is having periods where he drifts off to sleep while were talking and wakes up with some mild verbal stimuli.  He denies any recent illnesses.  He states he feels pretty good.  He did have an episode of vomiting this morning.  He denies any known fevers.  No cough or cold symptoms.  He says he lives at home with his wife.       Home Medications Prior to Admission medications   Medication Sig Start Date End Date Taking? Authorizing Provider  acetaminophen (TYLENOL) 650 MG CR tablet Take 650 mg by mouth daily as needed (knee pain).    Yes [provider]  apixaban (ELIQUIS) 5 MG TABS tablet TAKE ONE TABLET BY MOUTH TWICE A DAY Patient taking differently: Take 5 mg by mouth 2 (two) times daily. 01/07/22  Yes [place], Peter M, MD  DULoxetine (CYMBALTA) 30 MG capsule Take 30 mg by mouth daily. 09/25/19  Yes [provider]  K Phos Mono-Sod Phos Di & Mono (PHOSPHA 250 NEUTRAL) [id number] MG TABS TAKE 1 TABLET BY MOUTH DAILY AFTER BREAKFAST Patient taking differently: Take 1 tablet by mouth daily after breakfast. 08/13/21  Yes [place], Peter M, MD   metoprolol tartrate (LOPRESSOR) 25 MG tablet TAKE 1 AND 1/2 TABLET BY MOUTH TWO TIMES A DAY Patient taking differently: Take 35 mg by mouth 2 (two) times daily. 12/21/21  Yes [place], Peter M, MD  montelukast (SINGULAIR) 10 MG tablet TAKE ONE TABLET BY MOUTH DAILY AFTER BREAKFAST Patient taking differently: Take 10 mg by mouth daily after breakfast. 11/23/21  Yes [place], Peter M, MD  nitroGLYCERIN (NITROSTAT) 0.4 MG SL tablet PLACE 1 TABLET (0.4 MG TOTAL) UNDER THE TONGUE EVERY 5 (FIVE) MINUTESAS NEEDED FOR CHEST PAIN (UP TO 3 DOSES). Patient taking differently: Place 0.4 mg under the tongue every 5 (five) minutes as needed for chest pain. 06/18/20 06/04/22 Yes [place], Peter M, MD  pantoprazole (PROTONIX) 40 MG tablet TAKE 1 TABLET BY MOUTH DAILY 03/29/22  Yes [place], Peter M, MD  rosuvastatin (CRESTOR) 20 MG tablet TAKE 1 TABLET BY MOUTH DAILY 05/20/22  Yes [place], Peter M, MD      Allergies    Sulfamethoxazole-trimethoprim, Tape, Lasix [furosemide], Penicillins, and Sulfa drugs cross reactors    Review of Systems   Review of Systems  Constitutional:  Positive for fatigue. Negative for chills, diaphoresis and fever.  HENT:  Negative for congestion, rhinorrhea and sneezing.   Eyes: Negative.   Respiratory:  Negative for cough, chest tightness and shortness of breath.  Cardiovascular:  Negative for chest pain and leg swelling.  Gastrointestinal:  Positive for nausea and vomiting. Negative for abdominal pain, blood in stool and diarrhea.  Genitourinary:  Negative for difficulty urinating, flank pain, frequency and hematuria.  Musculoskeletal:  Positive for back pain. Negative for arthralgias.  Skin:  Negative for rash.  Neurological:  Negative for dizziness, speech difficulty, weakness, numbness and headaches.    Physical Exam Updated Vital Signs BP 121/79   Pulse 89   Temp 98.7 F (37.1 C) (Rectal)   Resp 11   SpO2 98%  Physical Exam Constitutional:      Appearance: He is  well-developed. He is ill-appearing.  HENT:     Head: Normocephalic and atraumatic.     Mouth/Throat:     Comments: Mouth is dry with a white coating on the lips Eyes:     Pupils: Pupils are equal, round, and reactive to light.  Neck:     Comments: No pain to the cervical, thoracic or lumbosacral spine Cardiovascular:     Rate and Rhythm: Regular rhythm. Tachycardia present.     Heart sounds: Normal heart sounds.  Pulmonary:     Effort: Pulmonary effort is normal. No respiratory distress.     Breath sounds: Normal breath sounds. No wheezing or rales.  Chest:     Chest wall: No tenderness.  Abdominal:     General: Bowel sounds are normal.     Palpations: Abdomen is soft.     Tenderness: There is no abdominal tenderness. There is no guarding or rebound.  Musculoskeletal:        General: Normal range of motion.     Cervical back: Normal range of motion and neck supple.  Lymphadenopathy:     Cervical: No cervical adenopathy.  Skin:    General: Skin is warm and dry.     Findings: No rash.  Neurological:     Mental Status: He is alert and oriented to person, place, and time.     Comments: Left-sided weakness from prior stroke.  He states he is at baseline.  He has 4 out of 5 strength on his right side.  Normal sensation to light touch in all extremities.     ED Results / Procedures / Treatments   Labs (all labs ordered are listed, but only abnormal results are displayed) Labs Reviewed  CBC WITH DIFFERENTIAL/PLATELET - Abnormal; Notable for the following components:      Result Value   Neutro Abs 9.3 (*)    Lymphs Abs 0.4 (*)    All other components within normal limits  BASIC METABOLIC PANEL - Abnormal; Notable for the following components:   Glucose, Bld 110 (*)    Creatinine, Ser 1.61 (*)    GFR, Estimated 40 (*)    All other components within normal limits  URINALYSIS, ROUTINE W REFLEX MICROSCOPIC - Abnormal; Notable for the following components:   Color, Urine AMBER  (*)    APPearance TURBID (*)    Hgb urine dipstick MODERATE (*)    Protein, ur 100 (*)    Nitrite POSITIVE (*)    Leukocytes,Ua MODERATE (*)    RBC / HPF >50 (*)    WBC, UA >50 (*)    Bacteria, UA MANY (*)    All other components within normal limits  RESP PANEL BY RT-PCR (RSV, FLU A&B, COVID)  [id number]  URINE CULTURE  LACTIC ACID, PLASMA  LACTIC ACID, PLASMA    EKG None  Radiology CT Abdomen Pelvis W  Contrast  Result Date: 06/04/2022 CLINICAL DATA:  Acute abdominal pain. EXAM: CT ABDOMEN AND PELVIS WITH CONTRAST TECHNIQUE: Multidetector CT imaging of the abdomen and pelvis was performed using the standard protocol following bolus administration of intravenous contrast. RADIATION DOSE REDUCTION: This exam was performed according to the departmental dose-optimization program which includes automated exposure control, adjustment of the mA and/or kV according to patient size and/or use of iterative reconstruction technique. CONTRAST:  29m ISOVUE-370 IOPAMIDOL (ISOVUE-370) INJECTION 76% COMPARISON:  None Available. FINDINGS: Lower Chest: No acute findings. Hepatobiliary: No hepatic masses identified. Gallbladder is unremarkable. No evidence of biliary ductal dilatation. Pancreas:  No mass or inflammatory changes. Spleen: Within normal limits in size and appearance. Adrenals/Urinary Tract: No suspicious masses identified. Multiple renal calculi are again seen bilaterally measuring up to 10 mm. A 7 mm calculus is seen in the proximal to mid left ureter, however there is no evidence of hydronephrosis. Unremarkable unopacified urinary bladder. Stomach/Bowel: Moderate size hiatal hernia is seen. No evidence of obstruction, inflammatory process or abnormal fluid collections. Diverticulosis is seen mainly involving the sigmoid colon, however there is no evidence of diverticulitis. Vascular/Lymphatic: No pathologically enlarged lymph nodes. No acute vascular findings. Aortic atherosclerotic calcification  incidentally noted. Reproductive:  Mildly enlarged prostate gland. Other:  None. Musculoskeletal:  No suspicious bone lesions identified. IMPRESSION: 7 mm calculus in proximal to mid left ureter, without evidence of hydronephrosis. Bilateral nephrolithiasis. Moderate hiatal hernia. Colonic diverticulosis, without radiographic evidence of diverticulitis. Mildly enlarged prostate. Aortic Atherosclerosis (ICD10-I70.0). Electronically Signed   By: JMarlaine HindM.D.   On: 06/04/2022 09:03   CT Head Wo Contrast  Result Date: 06/04/2022 CLINICAL DATA:  Mental status change with unknown cause EXAM: CT HEAD WITHOUT CONTRAST TECHNIQUE: Contiguous axial images were obtained from the base of the skull through the vertex without intravenous contrast. RADIATION DOSE REDUCTION: This exam was performed according to the departmental dose-optimization program which includes automated exposure control, adjustment of the mA and/or kV according to patient size and/or use of iterative reconstruction technique. COMPARISON:  08/15/2018 FINDINGS: Brain: Large remote left cerebellar and right cerebral infarcts. Generalized brain atrophy with ventriculomegaly. No acute infarct, hemorrhage, hydrocephalus, or collection. Vascular: No hyperdense vessel or unexpected calcification. Skull: Normal. Negative for fracture or focal lesion. Sinuses/Orbits: No acute finding. IMPRESSION: 1. No acute finding. 2. Extensive remote infarction and generalized brain atrophy. Electronically Signed   By: JJorje GuildM.D.   On: 06/04/2022 08:46   DG Chest 2 View  Result Date: 06/04/2022 CLINICAL DATA:  Weakness EXAM: CHEST - 2 VIEW COMPARISON:  05/24/2018 FINDINGS: Enlargement of cardiac silhouette post CABG and MVR. Atherosclerotic calcification aorta. Chronic pleural thickening or effusion RIGHT chest with scarring at minor fissure. No acute infiltrate, pleural effusion, or pneumothorax. Bones demineralized with changes of chronic RIGHT rotator  cuff tear noted. IMPRESSION: Chronic RIGHT basilar scarring. Enlargement of cardiac silhouette post CABG and MVR. No acute abnormalities. Aortic Atherosclerosis (ICD10-I70.0). Electronically Signed   By: MLavonia DanaM.D.   On: 06/04/2022 08:37    Procedures Procedures    Medications Ordered in ED Medications  sodium chloride 0.9 % bolus 500 mL (0 mLs Intravenous Stopped 06/04/22 0930)  iopamidol (ISOVUE-370) 76 % injection 75 mL (75 mLs Intravenous Contrast Given 06/04/22 0831)    ED Course/ Medical Decision Making/ A&P                           Medical Decision Making Amount and/or Complexity  of Data Reviewed Labs: ordered. Radiology: ordered.  Risk Prescription drug management. Decision regarding hospitalization.   Patient is a 88 year old male who presents with back pain.  He did not have any back pain on my exam.  There is no palpable spine tenderness.  He did have an episode of vomiting.  Given this, CT scan was performed which shows a stone in the proximal left ureter.  7 mm.  No obvious signs of obstruction.  His urine looks infected.  He was started on IV Rocephin.  His blood pressures been soft.  He got IV fluids.  His lactate is normal.  Rectal temp shows a normal temperature.  His creatinine is mildly elevated but based on chart review, it is similar to prior values.  I spoke with the urologist on-call, Dr.Machen, who will see the patient and likely place a stent today.  I spoke with Dr. Tamala Julian with the hospitalist service who will admit the patient for further treatment.  Patient seemed a little out of it on my exam.  He would wake up and be completely oriented but then would fall asleep easily.  He is on Eliquis.  Head CT was performed which shows no acute abnormality.  Chest x-ray does not show any evidence of infection.  This was interpreted by me and confirmed by the radiologist.  COVID/flu test were negative.  This is likely from his UTI.  Final Clinical Impression(s) /  ED Diagnoses Final diagnoses:  Kidney stone  Urinary tract infection with hematuria, site unspecified    Rx / DC Orders ED Discharge Orders     None         Malvin Johns, MD 06/04/22 1107    Malvin Johns, MD 06/04/22 1109

## 2022-06-04 NOTE — ED Notes (Signed)
Patient transported to X-ray 

## 2022-06-04 NOTE — Anesthesia Postprocedure Evaluation (Signed)
Anesthesia Post Note  Patient: Arthur [name]  Procedure(s) Performed: CYSTOSCOPY WITH RETROGRADE PYELOGRAM/LEFT URETERAL STENT PLACEMENT (Left)     Patient location during evaluation: PACU Anesthesia Type: General Level of consciousness: awake and alert, oriented and patient cooperative Pain management: pain level controlled Vital Signs Assessment: post-procedure vital signs reviewed and stable Respiratory status: spontaneous breathing, nonlabored ventilation and respiratory function stable Cardiovascular status: blood pressure returned to baseline and stable Postop Assessment: no apparent nausea or vomiting Anesthetic complications: no Comments: Fluid resuscitating postop w/ albumin, within 20% of baseline   No notable events documented.  Last Vitals:  Vitals:   06/04/22 1530 06/04/22 1545  BP: (!) 88/59 (!) 89/64  Pulse: 66 72  Resp: 14 18  Temp: 36.4 C   SpO2: 94% 99%    Last Pain:  Vitals:   06/04/22 1530  TempSrc:   PainSc: [place]

## 2022-06-04 NOTE — Transfer of Care (Signed)
Immediate Anesthesia Transfer of Care Note  Patient: Arthur [name]  Procedure(s) Performed: CYSTOSCOPY WITH RETROGRADE PYELOGRAM/LEFT URETERAL STENT PLACEMENT (Left)  Patient Location: PACU  Anesthesia Type:General  Level of Consciousness: drowsy  Airway & Oxygen Therapy: Patient Spontanous Breathing  Post-op Assessment: Report given to RN and Post -op Vital signs reviewed and stable  Post vital signs: Reviewed and stable  Last Vitals:  Vitals Value Taken Time  BP 88/59 06/04/22 1533  Temp    Pulse 60 06/04/22 1536  Resp 17 06/04/22 1536  SpO2 96 % 06/04/22 1536  Vitals shown include unvalidated device data.  Last Pain:  Vitals:   06/04/22 1530  TempSrc:   PainSc: Asleep         Complications: No notable events documented.

## 2022-06-04 NOTE — Anesthesia Procedure Notes (Signed)
Procedure Name: LMA Insertion Date/Time: 06/04/2022 2:34 PM  Performed by: Georgia Duff, CRNAPre-anesthesia Checklist: Patient identified, Emergency Drugs available, Suction available and Patient being monitored Patient Re-evaluated:Patient Re-evaluated prior to induction Oxygen Delivery Method: Circle System Utilized Preoxygenation: Pre-oxygenation with 100% oxygen Induction Type: IV induction Ventilation: Mask ventilation without difficulty LMA: LMA inserted LMA Size: 5.0 Number of attempts: 1 Airway Equipment and Method: Bite block Placement Confirmation: positive ETCO2 Tube secured with: Tape Dental Injury: Teeth and Oropharynx as per pre-operative assessment

## 2022-06-04 NOTE — Consult Note (Signed)
Urology Consult   Reason for consult: left ureteral stone, UTI  History of Present Illness: Arthur [name] is a 88 y.o.Who initially presented to the emergency department earlier this morning at [facility] complaining of back pain.  A CT scan was obtained that shows bilateral renal stones, right more so than left, but there is a seven 8 mm stone in the proximal left ureter.  His urinalysis in the ED is positive for nitrite and leukocyte Esterase  Creatinine in the ED 1.61; his baseline appears closer to 1.3.  White count is 10  At the time of my exam, Arthur [name] is oriented to person, place, and time. He does not recall seeing a urologist previously. His BP has trended better in the ED with fluid resuscitation.   Past Medical History:  Diagnosis Date   Arthritis    "some in my joints" (01/24/2015)   CKD (chronic kidney disease), stage II    Coronary artery disease    a. s/p CABG in 2005 with MV repair and MAZE procedure. b. s/p DES to prox RCA 01/2015.    CVA (cerebrovascular accident) ([place]) ~ 2014   RIGHT BRAIN; denies residual on 01/24/2015   Esophagitis    Distal esophagitis   GERD (gastroesophageal reflux disease)    Heart murmur    History of hiatal hernia    History of recurrent TIAs    Hyperlipidemia    Hypertension    Hypertensive vascular disease    MVP (mitral valve prolapse)    Nephrolithiasis    Odynophagia    PAF (paroxysmal atrial fibrillation) (HCC)    Transudative pleural effusion     Past Surgical History:  Procedure Laterality Date   APPENDECTOMY  1960's   CARDIAC CATHETERIZATION  03/19/2004   CARDIAC CATHETERIZATION N/A 01/24/2015   Procedure: Left Heart Cath and Coronary Angiography;  Surgeon: Peter M Martinique, MD;  Location: [facility];  Service: Cardiovascular;  Laterality: N/A;   CARDIAC CATHETERIZATION N/A 01/24/2015   Procedure: Coronary Stent Intervention;  Surgeon: Peter M Martinique, MD;  Location: [facility];  Service: Cardiovascular;   Laterality: N/A;   CORONARY ANGIOPLASTY WITH STENT PLACEMENT  01/24/2015   "1 stent"   CORONARY ARTERY BYPASS GRAFT  04/2004   LIMA GRAFT TO THE DISTAL LAD, SAPHENOUS VEIN GRAFT TO THE FIRST DIADGONAL BRANCH, SAPHENOUS VEIN GRAFT TO THE THIRD MARIGINAL BRANCH, AND SAPHENOUS VEIN GRAFT TO THE PDA   IR ANGIO VERTEBRAL SEL SUBCLAVIAN INNOMINATE UNI R MOD SED  07/11/2017   IR PERCUTANEOUS ART THROMBECTOMY/INFUSION INTRACRANIAL INC DIAG ANGIO  07/11/2017   MAZE  04/2004   MITRAL VALVE REPAIR  04/2004   RADIOLOGY WITH ANESTHESIA N/A 07/11/2017   Procedure: RADIOLOGY WITH ANESTHESIA;  Surgeon: Luanne Bras, MD;  Location: [facility];  Service: Radiology;  Laterality: N/A;   TEE WITHOUT CARDIOVERSION  05/19/2011   Procedure: TRANSESOPHAGEAL ECHOCARDIOGRAM (TEE);  Surgeon: Peter Martinique, MD;  Location: [facility];  Service: Cardiovascular;  Laterality: N/A;   [facility] Medications:  Home Meds:  No current facility-administered medications on file prior to encounter.   Current Outpatient Medications on File Prior to Encounter  Medication Sig Dispense Refill   acetaminophen (TYLENOL) 650 MG CR tablet Take 650 mg by mouth daily as needed (knee pain).      apixaban (ELIQUIS) 5 MG TABS tablet TAKE ONE TABLET BY MOUTH TWICE A DAY 60 tablet 5   DULoxetine (CYMBALTA) 30 MG capsule  Take 30 mg by mouth daily.     fluticasone (CUTIVATE) 0.05 % cream Apply topically 2 (two) times daily as needed.     K Phos Mono-Sod Phos Di & Mono (PHOSPHA 250 NEUTRAL) 155-852-130 MG TABS TAKE 1 TABLET BY MOUTH DAILY AFTER BREAKFAST 90 tablet 3   metoprolol tartrate (LOPRESSOR) 25 MG tablet TAKE 1 AND 1/2 TABLET BY MOUTH TWO TIMES A DAY 270 tablet 2   montelukast (SINGULAIR) 10 MG tablet TAKE ONE TABLET BY MOUTH DAILY AFTER BREAKFAST 90 tablet 2   nitroGLYCERIN (NITROSTAT) 0.4 MG SL tablet PLACE 1 TABLET (0.4 MG TOTAL) UNDER THE TONGUE EVERY 5 (FIVE) MINUTESAS NEEDED FOR CHEST  PAIN (UP TO 3 DOSES). 25 tablet 2   pantoprazole (PROTONIX) 40 MG tablet TAKE 1 TABLET BY MOUTH DAILY 90 tablet 0   rosuvastatin (CRESTOR) 20 MG tablet TAKE 1 TABLET BY MOUTH DAILY 90 tablet 3   tiZANidine (ZANAFLEX) 4 MG tablet Take 1 tablet by mouth daily as needed for muscle spasms.       Scheduled Meds: Continuous Infusions: PRN Meds:.  Allergies:  Allergies  Allergen Reactions   Sulfamethoxazole-Trimethoprim     Other reaction(s): Unknown   Tape Other (See Comments)    PATIENT IS TAKING COUMADIN; HIS SKIN TEARS & BRUISES EASILY; PLEASE USE COBAN WRAP OR AN ALTERNATIVE TO MEDICAL TAPE!!   Lasix [Furosemide] Rash   Penicillins Rash    Has patient had a PCN reaction causing immediate rash, facial/tongue/throat swelling, SOB or lightheadedness with hypotension: Yes Has patient had a PCN reaction causing severe rash involving mucus membranes or skin necrosis: Unknown Has patient had a PCN reaction that required hospitalization: Unknown Has patient had a PCN reaction occurring within the last 10 years: unknown If all of the above answers are "NO", then may proceed with Cephalosporin use.    Sulfa Drugs Cross Reactors Rash    Family History  Problem Relation Age of Onset   Heart attack Mother    Stroke Father    Hypertension Father    Kidney failure Father     Social History:  reports that he quit smoking about 67 years ago. His smoking use included cigarettes. He has never used smokeless tobacco. He reports that he does not drink alcohol and does not use drugs.  ROS: A complete review of systems was performed.  All systems are negative except for pertinent findings as noted.  Physical Exam:  Vital signs in last 24 hours: Temp:  [98.2 F (36.8 C)-98.7 F (37.1 C)] 98.7 F (37.1 C) (12/22 0804) Pulse Rate:  [89-98] 89 (12/22 1015) Resp:  [10-22] 11 (12/22 1015) BP: (95-126)/(59-80) 121/79 (12/22 1015) SpO2:  [93 %-98 %] 98 % (12/22 1015) Constitutional:  Alert and  oriented, No acute distress Cardiovascular: Regular rate and rhythm Respiratory: Normal respiratory effort, Lungs clear bilaterally GI: Abdomen is soft, nontender, nondistended, no abdominal masses GU: No CVA tenderness Neurologic: Grossly intact, no focal deficits Psychiatric: Normal mood and affect  Laboratory Data:  Recent Labs    06/04/22 0546  WBC 10.0  HGB 13.9  HCT 41.4  PLT 243    Recent Labs    06/04/22 0546  NA 139  K 4.0  CL 104  GLUCOSE 110*  BUN 18  CALCIUM 9.4  CREATININE 1.61*     Results for orders placed or performed during the hospital encounter of 06/04/22 (from the past 24 hour(s))  Resp panel by RT-PCR (RSV, Flu A&B, Covid) Anterior Nasal Swab  Status: None   Collection Time: 06/04/22  5:35 AM   Specimen: Anterior Nasal Swab  Result Value Ref Range   SARS Coronavirus 2 by RT PCR NEGATIVE NEGATIVE   Influenza A by PCR NEGATIVE NEGATIVE   Influenza B by PCR NEGATIVE NEGATIVE   Resp Syncytial Virus by PCR NEGATIVE NEGATIVE  CBC with Differential     Status: Abnormal   Collection Time: 06/04/22  5:46 AM  Result Value Ref Range   WBC 10.0 4.0 - 10.5 K/uL   RBC 4.63 4.22 - 5.81 MIL/uL   Hemoglobin 13.9 13.0 - 17.0 g/dL   HCT 41.4 39.0 - 52.0 %   MCV 89.4 80.0 - 100.0 fL   MCH 30.0 26.0 - 34.0 pg   MCHC 33.6 30.0 - 36.0 g/dL   RDW 14.6 11.5 - 15.5 %   Platelets 243 150 - 400 K/uL   nRBC 0.0 0.0 - 0.2 %   Neutrophils Relative % 94 %   Neutro Abs 9.3 (H) 1.7 - 7.7 K/uL   Lymphocytes Relative 4 %   Lymphs Abs 0.4 (L) 0.7 - 4.0 K/uL   Monocytes Relative 1 %   Monocytes Absolute 0.1 0.1 - 1.0 K/uL   Eosinophils Relative 1 %   Eosinophils Absolute 0.1 0.0 - 0.5 K/uL   Basophils Relative 0 %   Basophils Absolute 0.0 0.0 - 0.1 K/uL   Immature Granulocytes 0 %   Abs Immature Granulocytes 0.04 0.00 - 0.07 K/uL  Basic metabolic panel     Status: Abnormal   Collection Time: 06/04/22  5:46 AM  Result Value Ref Range   Sodium 139 135 - 145 mmol/L    Potassium 4.0 3.5 - 5.1 mmol/L   Chloride 104 98 - 111 mmol/L   CO2 22 22 - 32 mmol/L   Glucose, Bld 110 (H) 70 - 99 mg/dL   BUN 18 8 - 23 mg/dL   Creatinine, Ser 1.61 (H) 0.61 - 1.24 mg/dL   Calcium 9.4 8.9 - 10.3 mg/dL   GFR, Estimated 40 (L) >60 mL/min   Anion gap 13 5 - 15  Lactic acid, plasma     Status: None   Collection Time: 06/04/22  9:14 AM  Result Value Ref Range   Lactic Acid, Venous 1.4 0.5 - 1.9 mmol/L  Urinalysis, Routine w reflex microscopic Urine, Clean Catch     Status: Abnormal   Collection Time: 06/04/22  9:53 AM  Result Value Ref Range   Color, Urine AMBER (A) YELLOW   APPearance TURBID (A) CLEAR   Specific Gravity, Urine 1.024 1.005 - 1.030   pH 5.0 5.0 - 8.0   Glucose, UA NEGATIVE NEGATIVE mg/dL   Hgb urine dipstick MODERATE (A) NEGATIVE   Bilirubin Urine NEGATIVE NEGATIVE   Ketones, ur NEGATIVE NEGATIVE mg/dL   Protein, ur 100 (A) NEGATIVE mg/dL   Nitrite POSITIVE (A) NEGATIVE   Leukocytes,Ua MODERATE (A) NEGATIVE   RBC / HPF >50 (H) 0 - 5 RBC/hpf   WBC, UA >50 (H) 0 - 5 WBC/hpf   Bacteria, UA MANY (A) NONE SEEN   Squamous Epithelial / LPF 0-5 0 - 5   WBC Clumps PRESENT    Mucus PRESENT    Recent Results (from the past 240 hour(s))  Resp panel by RT-PCR (RSV, Flu A&B, Covid) Anterior Nasal Swab     Status: None   Collection Time: 06/04/22  5:35 AM   Specimen: Anterior Nasal Swab  Result Value Ref Range Status   SARS Coronavirus  2 by RT PCR NEGATIVE NEGATIVE Final    Comment: (NOTE) SARS-CoV-2 target nucleic acids are NOT DETECTED.  The SARS-CoV-2 RNA is generally detectable in upper respiratory specimens during the acute phase of infection. The lowest concentration of SARS-CoV-2 viral copies this assay can detect is 138 copies/mL. A negative result does not preclude SARS-Cov-2 infection and should not be used as the sole basis for treatment or other patient management decisions. A negative result may occur with  improper specimen  collection/handling, submission of specimen other than nasopharyngeal swab, presence of viral mutation(s) within the areas targeted by this assay, and inadequate number of viral copies(<138 copies/mL). A negative result must be combined with clinical observations, patient history, and epidemiological information. The expected result is Negative.  Fact Sheet for Patients:  EntrepreneurPulse.com.au  Fact Sheet for Healthcare Providers:  IncredibleEmployment.be  This test is no t yet approved or cleared by the [place] FDA and  has been authorized for detection and/or diagnosis of SARS-CoV-2 by FDA under an Emergency Use Authorization (EUA). This EUA will remain  in effect (meaning this test can be used) for the duration of the COVID-19 declaration under Section 564(b)(1) of the Act, 21 U.S.C.section 360bbb-3(b)(1), unless the authorization is terminated  or revoked sooner.       Influenza A by PCR NEGATIVE NEGATIVE Final   Influenza B by PCR NEGATIVE NEGATIVE Final    Comment: (NOTE) The Xpert Xpress SARS-CoV-2/FLU/RSV plus assay is intended as an aid in the diagnosis of influenza from Nasopharyngeal swab specimens and should not be used as a sole basis for treatment. Nasal washings and aspirates are unacceptable for Xpert Xpress SARS-CoV-2/FLU/RSV testing.  Fact Sheet for Patients: EntrepreneurPulse.com.au  Fact Sheet for Healthcare Providers: IncredibleEmployment.be  This test is not yet approved or cleared by the [place] FDA and has been authorized for detection and/or diagnosis of SARS-CoV-2 by FDA under an Emergency Use Authorization (EUA). This EUA will remain in effect (meaning this test can be used) for the duration of the COVID-19 declaration under Section 564(b)(1) of the Act, 21 U.S.C. section 360bbb-3(b)(1), unless the authorization is terminated or revoked.     Resp Syncytial  Virus by PCR NEGATIVE NEGATIVE Final    Comment: (NOTE) Fact Sheet for Patients: EntrepreneurPulse.com.au  Fact Sheet for Healthcare Providers: IncredibleEmployment.be  This test is not yet approved or cleared by the [place] FDA and has been authorized for detection and/or diagnosis of SARS-CoV-2 by FDA under an Emergency Use Authorization (EUA). This EUA will remain in effect (meaning this test can be used) for the duration of the COVID-19 declaration under Section 564(b)(1) of the Act, 21 U.S.C. section 360bbb-3(b)(1), unless the authorization is terminated or revoked.  Performed at [facility], [place] [address] [place], [place] [address]     Renal Function: Recent Labs    06/04/22 0546  CREATININE 1.61*   Estimated Creatinine Clearance: 27.9 mL/min (A) (by C-G formula based on SCr of 1.61 mg/dL (H)).  Radiologic Imaging: CT Abdomen Pelvis W Contrast  Result Date: 06/04/2022 CLINICAL DATA:  Acute abdominal pain. EXAM: CT ABDOMEN AND PELVIS WITH CONTRAST TECHNIQUE: Multidetector CT imaging of the abdomen and pelvis was performed using the standard protocol following bolus administration of intravenous contrast. RADIATION DOSE REDUCTION: This exam was performed according to the departmental dose-optimization program which includes automated exposure control, adjustment of the mA and/or kV according to patient size and/or use of iterative reconstruction technique. CONTRAST:  21m ISOVUE-370 IOPAMIDOL (ISOVUE-370) INJECTION 76%  COMPARISON:  None Available. FINDINGS: Lower Chest: No acute findings. Hepatobiliary: No hepatic masses identified. Gallbladder is unremarkable. No evidence of biliary ductal dilatation. Pancreas:  No mass or inflammatory changes. Spleen: Within normal limits in size and appearance. Adrenals/Urinary Tract: No suspicious masses identified. Multiple renal calculi are again seen bilaterally measuring up to 10 mm. A 7  mm calculus is seen in the proximal to mid left ureter, however there is no evidence of hydronephrosis. Unremarkable unopacified urinary bladder. Stomach/Bowel: Moderate size hiatal hernia is seen. No evidence of obstruction, inflammatory process or abnormal fluid collections. Diverticulosis is seen mainly involving the sigmoid colon, however there is no evidence of diverticulitis. Vascular/Lymphatic: No pathologically enlarged lymph nodes. No acute vascular findings. Aortic atherosclerotic calcification incidentally noted. Reproductive:  Mildly enlarged prostate gland. Other:  None. Musculoskeletal:  No suspicious bone lesions identified. IMPRESSION: 7 mm calculus in proximal to mid left ureter, without evidence of hydronephrosis. Bilateral nephrolithiasis. Moderate hiatal hernia. Colonic diverticulosis, without radiographic evidence of diverticulitis. Mildly enlarged prostate. Aortic Atherosclerosis (ICD10-I70.0). Electronically Signed   By: Marlaine Hind M.D.   On: 06/04/2022 09:03   CT Head Wo Contrast  Result Date: 06/04/2022 CLINICAL DATA:  Mental status change with unknown cause EXAM: CT HEAD WITHOUT CONTRAST TECHNIQUE: Contiguous axial images were obtained from the base of the skull through the vertex without intravenous contrast. RADIATION DOSE REDUCTION: This exam was performed according to the departmental dose-optimization program which includes automated exposure control, adjustment of the mA and/or kV according to patient size and/or use of iterative reconstruction technique. COMPARISON:  08/15/2018 FINDINGS: Brain: Large remote left cerebellar and right cerebral infarcts. Generalized brain atrophy with ventriculomegaly. No acute infarct, hemorrhage, hydrocephalus, or collection. Vascular: No hyperdense vessel or unexpected calcification. Skull: Normal. Negative for fracture or focal lesion. Sinuses/Orbits: No acute finding. IMPRESSION: 1. No acute finding. 2. Extensive remote infarction and  generalized brain atrophy. Electronically Signed   By: Jorje Guild M.D.   On: 06/04/2022 08:46   DG Chest 2 View  Result Date: 06/04/2022 CLINICAL DATA:  Weakness EXAM: CHEST - 2 VIEW COMPARISON:  05/24/2018 FINDINGS: Enlargement of cardiac silhouette post CABG and MVR. Atherosclerotic calcification aorta. Chronic pleural thickening or effusion RIGHT chest with scarring at minor fissure. No acute infiltrate, pleural effusion, or pneumothorax. Bones demineralized with changes of chronic RIGHT rotator cuff tear noted. IMPRESSION: Chronic RIGHT basilar scarring. Enlargement of cardiac silhouette post CABG and MVR. No acute abnormalities. Aortic Atherosclerosis (ICD10-I70.0). Electronically Signed   By: Lavonia Dana M.D.   On: 06/04/2022 08:37    I independently reviewed the above imaging studies.  Impression/Recommendation 88 yo M with left ureteral stone, likely UTI, bilateral nephrolithiasis --To OR emergently for left ureteral stent placement. Procedure and risks reviewed in detail with patient. We discussed he is at a higher risk of hematuria/clot retention given his blood thinner and enlarged prostate.   Donald Pore MD 06/04/2022, 10:41 AM  [facility]  Pager: [phone]

## 2022-06-04 NOTE — ED Provider Triage Note (Signed)
  Emergency Medicine Provider Triage Evaluation Note  MRN:  [id number]  Arrival date & time: 06/04/22    Medically screening exam initiated at 5:35 AM.   CC:   Back Pain   HPI:  MAVERYCK BAHRI is a 88 y.o. year-old male presents to the ED with chief complaint of low back pain.  States that the back pain has resolved, but had an episode of vomiting in triage.  Denies injuries.  Denies recent illnesses.  History provided by patient. ROS:  -As included in HPI PE:   Vitals:   06/04/22 0528  BP: 126/80  Pulse: 97  Resp: 18  Temp: 98.2 F (36.8 C)  SpO2: 94%    Non-toxic appearing No respiratory distress  MDM:   I've ordered labs and imaging in triage to expedite lab/diagnostic workup.  Patient was informed that the remainder of the evaluation will be completed by another provider, this initial triage assessment does not replace that evaluation, and the importance of remaining in the ED until their evaluation is complete.    Montine Circle, PA-C 06/04/22 [phone]

## 2022-06-04 NOTE — ED Notes (Signed)
Patients wife requested an update, Tamera Punt, MD notified.

## 2022-06-04 NOTE — Op Note (Signed)
Operative Note  Preoperative diagnosis:  1.  Left ureteral stone 2. UTI 3. Bilateral renal stones  Postoperative diagnosis: same  Procedure(s): 1.  Cystoscopy 2. left retrograde pyelogram with interpretation 3. left ureteral stent placement 6x26 4. Fluoroscopy <1 hour with intraoperative interpretation  Surgeon: Donald Pore, MD  Assistants:  None  Anesthesia:  General  Complications:  None  EBL:  5 cc  Specimens: 1. none  Drains/Catheters: 1.  Left 6Fr x 26 cm ureteral stent 2. 20 Fr catheter with 20 cc in balloon  Intraoperative findings:   Cystoscopy demonstrated no suspicious lesions, masses, stones or other pathology. Retrograde pyelogram demonstrated left hydronephrosis. Successful left ureteral stent placement with curl in the renal pelvis and bladder respectively. Foley placed at end of case due to UTI and unclear bladder function at baseline  Indication:  Arthur [name] is a 88 y.o. male with the above diagnoses. After reviewing the management options for treatment, he elected to proceed with the above surgical procedure(s). We have discussed the potential benefits and risks of the procedure, side effects of the proposed treatment, the likelihood of the patient achieving the goals of the procedure, and any potential problems that might occur during the procedure or recuperation. Informed consent has been obtained.  Description of procedure: The patient was taken to the operating room and general anesthesia was induced.  The patient was placed in the dorsal lithotomy position, prepped and draped in the usual sterile fashion, and preoperative antibiotics were administered. A preoperative time-out was performed.   Cystourethroscopy was performed.  The patient's urethra was examined and was normal. There was notable trilobar prostatic hypertrophy. The bladder was then systematically examined in its entirety. There was no evidence for any bladder tumors, stones, or  other mucosal pathology.    Attention then turned to the left ureteral orifice. A 0.038 zip wire was passed through the left orifice and over the wire a 5 Fr open ended catheter was inserted and passed up to the level of the renal pelvis. Omnipaque contrast was injected through the ureteral catheter and a retrograde pyelogram was performed with findings as dictated above. The wire was then replaced and the open ended catheter was removed.   A 6Fr x 26cm ureteral stent was advance over the wire. The stent was positioned appropriately under fluoroscopic and cystoscopic guidance.  The wire was then removed with an adequate stent curl noted in the renal pelvis as well as in the bladder.  The bladder was then emptied and the procedure ended. I elected to place a 20 fr coude catheter at the end of the case given the presence of a UTI and unclear bladder function. As the patient had an enlarged prostate, I inflated the balloon with 20 cc to tamponade any potential bleeding vessels on the prostate. The urine was draining light pink  The patient appeared to tolerate the procedure well and without complications.  The patient was able to be awakened and transferred to the recovery unit in satisfactory condition.   Plan:  return to floor -will arrange for staged ureteroscopy as outpatient -would leave foley until patient condition improves and culture results come back. Thereafter it may be dc'd at the discretion of the primary team  [facility]  Pager: [phone]

## 2022-06-04 NOTE — H&P (Signed)
History and Physical    Patient: Arthur [name] VWU:[id number] DOB: 12-22-1928 DOA: 06/04/2022 DOS: the patient was seen and examined on 06/04/2022 PCP: Kristen Loader, FNP  Patient coming from: Home via EMS  Chief Complaint:  Chief Complaint  Patient presents with   Back Pain   HPI: JAMS TRICKETT is a 88 y.o. male with medical history significant of hypertension, hyperlipidemia, PAF on Eliquis, s/p Maze procedure, CAD  s/p CABG, and CVA with L sided weakness who presented initially with complaints of severe lower back pain.  At baseline patient is only able to assist with the help of caregivers and transitioning and pivoting, but is nonambulatory.  He had not had any recent falls or trauma to onset symptoms.  His wife makes note that he seemed to be in significant discomfort and was unable to review position himself to get comfortable. He asked for tylenol for the pain, but he was shaking so bad that she called 911.  He had not taken any of his meds this morning.   In the emergency department patient was noted to be afebrile with respirations 18-22, blood pressures 96/59 -126/80, and all other vital signs maintained.  Labs significant for WBC 10 with neutrophil count elevated, creatinine 1.61, BUN 18, and lactic acid 1.4.  Chest ray revealed chronic right basilar scarring with cardiomegaly, and no other acute abnormality.  Influenza and COVID-19 screening were negative.  Urinalysis significant for moderate hemoglobin, moderate leukocytes, positive nitrites, many bacteria, greater than 50 RBC/hpf, and greater than 50 WBCs.  CT scan of the abdomen and pelvis noted a 7 mm calculus in the proximal to mid left ureter without evidence of hydronephrosis.  Dr.Machen of urology had been consulted and plan to the patient to the operating room for left ureteral stent placement.  Patient was given normal saline IV fluids 500 mL.  Review of Systems: As mentioned in the history of present illness. All other  systems reviewed and are negative. Past Medical History:  Diagnosis Date   Arthritis    "some in my joints" (01/24/2015)   CKD (chronic kidney disease), stage II    Coronary artery disease    a. s/p CABG in 2005 with MV repair and MAZE procedure. b. s/p DES to prox RCA 01/2015.    CVA (cerebrovascular accident) ([place]) ~ 2014   RIGHT BRAIN; denies residual on 01/24/2015   Esophagitis    Distal esophagitis   GERD (gastroesophageal reflux disease)    Heart murmur    History of hiatal hernia    History of recurrent TIAs    Hyperlipidemia    Hypertension    Hypertensive vascular disease    MVP (mitral valve prolapse)    Nephrolithiasis    Odynophagia    PAF (paroxysmal atrial fibrillation) (HCC)    Transudative pleural effusion    Past Surgical History:  Procedure Laterality Date   APPENDECTOMY  1960's   CARDIAC CATHETERIZATION  03/19/2004   CARDIAC CATHETERIZATION N/A 01/24/2015   Procedure: Left Heart Cath and Coronary Angiography;  Surgeon: Peter M Martinique, MD;  Location: [facility];  Service: Cardiovascular;  Laterality: N/A;   CARDIAC CATHETERIZATION N/A 01/24/2015   Procedure: Coronary Stent Intervention;  Surgeon: Peter M Martinique, MD;  Location: [facility];  Service: Cardiovascular;  Laterality: N/A;   CORONARY ANGIOPLASTY WITH STENT PLACEMENT  01/24/2015   "1 stent"   CORONARY ARTERY BYPASS GRAFT  04/2004   LIMA GRAFT TO THE DISTAL LAD, SAPHENOUS VEIN  GRAFT TO THE FIRST DIADGONAL BRANCH, SAPHENOUS VEIN GRAFT TO THE THIRD MARIGINAL BRANCH, AND SAPHENOUS VEIN GRAFT TO THE PDA   IR ANGIO VERTEBRAL SEL SUBCLAVIAN INNOMINATE UNI R MOD SED  07/11/2017   IR PERCUTANEOUS ART THROMBECTOMY/INFUSION INTRACRANIAL INC DIAG ANGIO  07/11/2017   MAZE  04/2004   MITRAL VALVE REPAIR  04/2004   RADIOLOGY WITH ANESTHESIA N/A 07/11/2017   Procedure: RADIOLOGY WITH ANESTHESIA;  Surgeon: Luanne Bras, MD;  Location: [facility];  Service: Radiology;  Laterality: N/A;   TEE WITHOUT  CARDIOVERSION  05/19/2011   Procedure: TRANSESOPHAGEAL ECHOCARDIOGRAM (TEE);  Surgeon: Peter Martinique, MD;  Location: [facility];  Service: Cardiovascular;  Laterality: N/A;   [place]   Social History:  reports that he quit smoking about 67 years ago. His smoking use included cigarettes. He has never used smokeless tobacco. He reports that he does not drink alcohol and does not use drugs.  Allergies  Allergen Reactions   Sulfamethoxazole-Trimethoprim     Other reaction(s): Unknown   Tape Other (See Comments)    PATIENT IS TAKING COUMADIN; HIS SKIN TEARS & BRUISES EASILY; PLEASE USE COBAN WRAP OR AN ALTERNATIVE TO MEDICAL TAPE!!   Lasix [Furosemide] Rash   Penicillins Rash    Has patient had a PCN reaction causing immediate rash, facial/tongue/throat swelling, SOB or lightheadedness with hypotension: Yes Has patient had a PCN reaction causing severe rash involving mucus membranes or skin necrosis: Unknown Has patient had a PCN reaction that required hospitalization: Unknown Has patient had a PCN reaction occurring within the last 10 years: unknown If all of the above answers are "NO", then may proceed with Cephalosporin use.    Sulfa Drugs Cross Reactors Rash    Family History  Problem Relation Age of Onset   Heart attack Mother    Stroke Father    Hypertension Father    Kidney failure Father     Prior to Admission medications   Medication Sig Start Date End Date Taking? Authorizing Provider  nitroGLYCERIN (NITROSTAT) 0.4 MG SL tablet PLACE 1 TABLET (0.4 MG TOTAL) UNDER THE TONGUE EVERY 5 (FIVE) MINUTESAS NEEDED FOR CHEST PAIN (UP TO 3 DOSES). Patient taking differently: Place 0.4 mg under the tongue every 5 (five) minutes as needed for chest pain. 06/18/20 06/04/22 Yes Martinique, Peter M, MD  acetaminophen (TYLENOL) 650 MG CR tablet Take 650 mg by mouth daily as needed (knee pain).     [provider]  apixaban (ELIQUIS) 5 MG TABS tablet TAKE ONE  TABLET BY MOUTH TWICE A DAY Patient taking differently: Take 5 mg by mouth 2 (two) times daily. 01/07/22   Martinique, Peter M, MD  DULoxetine (CYMBALTA) 30 MG capsule Take 30 mg by mouth daily. 09/25/19   [provider]  fluticasone (CUTIVATE) 0.05 % cream Apply 1 Application topically 2 (two) times daily as needed. 05/11/21   [provider]  K Phos Mono-Sod Phos Di & Mono (PHOSPHA 250 NEUTRAL) [phone] MG TABS TAKE 1 TABLET BY MOUTH DAILY AFTER BREAKFAST Patient taking differently: Take 1 tablet by mouth daily after breakfast. 08/13/21   Martinique, Peter M, MD  metoprolol tartrate (LOPRESSOR) 25 MG tablet TAKE 1 AND 1/2 TABLET BY MOUTH TWO TIMES A DAY Patient taking differently: Take 35 mg by mouth 2 (two) times daily. 12/21/21   Martinique, Peter M, MD  montelukast (SINGULAIR) 10 MG tablet TAKE ONE TABLET BY MOUTH DAILY AFTER BREAKFAST Patient taking differently: Take 10 mg by mouth daily after breakfast.  11/23/21   Martinique, Peter M, MD  pantoprazole (PROTONIX) 40 MG tablet TAKE 1 TABLET BY MOUTH DAILY 03/29/22   Martinique, Peter M, MD  rosuvastatin (CRESTOR) 20 MG tablet TAKE 1 TABLET BY MOUTH DAILY 05/20/22   Martinique, Peter M, MD    Physical Exam: Vitals:   06/04/22 0804 06/04/22 0930 06/04/22 1000 06/04/22 1015  BP:  107/71 106/72 121/79  Pulse:  93 90 89  Resp:  '17 10 11  '$ Temp: 98.7 F (37.1 C)     TempSrc: Rectal     SpO2:  96% 97% 98%  Exam  Constitutional: elderly man currently in no acute distress  Eyes: PERRL, lids and conjunctivae normal ENMT: Mucous membranes are moist.   Neck: normal, supple  Respiratory: Decreased overall aeration with mild expiratory wheeze that seem to improve with deep inspiration. Cardiovascular: Irregular irregular no extremity edema.    Abdomen: no tenderness, no masses palpated.  Bowel sounds positive.  Foley catheter currently in place draining blood-tinged urine Musculoskeletal: no clubbing / cyanosis. No joint deformity upper and lower  extremities. Good ROM, no contractures. Normal muscle tone.  Skin: no rashes, lesions, ulcers. No induration Neurologic: CN 2-12 grossly intact.  Left leg weakness Psychiatric: Normal judgment and insight. Alert and oriented x 3. Normal mood.   Data Reviewed:  Reviewed labs, imaging and pertinent records as noted above in HPI.  Assessment and Plan:  Urinary tract infection with hematuria kidney stone Acute.  Patient presented with complaints of back pain.  Urinalysis significant for moderate hemoglobin, moderate leukocytes, positive nitrites, many bacteria, greater than 50 RBC/hpf, and greater than 50 WBCs.  CT scan of the abdomen and pelvis noted a 7 mm stone without significant signs of obstruction in the left ureter.  Urine cultures have been sent and the patient was started on ciprofloxacin.  Patient underwent cystostomy with Dr. Cain Sieve with placement of a left ureteral stent.  Patient had given surgical prophylaxis of ciprofloxacin.  Plan is for patient to have staged uteroscopy as an outpatient.  Foley recommended to stay in place until patient's condition proves -Admit to a telemetry bed -Heart healthy diet -Follow-up urine culture -Continue empiric antibiotics of Rocephin due to concern of possible delirium given patient's age -Continue Tylenol as needed for pain  Transient hypotension Acute.  After the procedure patient blood pressures were noted to drop down into the 80s temporarily.  Patient had been given albumin with improvement in blood pressures.  He had received metoprolol at around 1347. -Hold metoprolol -Goal MAP greater than 65  Renal insufficiency superimposed on chronic kidney disease stage III Patient presents with creatinine of 1.61 with BUN 18.  Baseline creatinine previously been around 1.3.  This is not greater than 0.3 increased from baseline to suggest acute kidney injury. -Recheck kidney function in a.m.  Chronic cough Patient noted to have a cough on exam.   Chest x-ray noted right basilar scarring without acute abnormality.  Patient wife notes cough is chronic for which he is on Singulair. -Incentive spirometry  Persistent atrial fibrillation on chronic anticoagulation Patient appears to be in atrial fibrillation but relatively rate controlled.  Blood pressures -Hold Eliquis, and plan on resuming after 2 days as long as patient not noted to have significant bleeding.  CAD Patient with prior history of CABG in 2005 with mitral valve repair at that time and maze procedure.  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  Hyperlipidemia Last LDL was 82 -Continue Crestor  GERD -Continue Protonix  DVT prophylaxis: SCDs Advance Care Planning:   Code Status: Full Code   Consults: Urology  Family Communication: Wife updated over the phone  Severity of Illness: The appropriate patient status for this patient is INPATIENT. Inpatient status is judged to be reasonable and necessary in order to provide the required intensity of service to ensure the patient's safety. The patient's presenting symptoms, physical exam findings, and initial radiographic and laboratory data in the context of their chronic comorbidities is felt to place them at high risk for further clinical deterioration. Furthermore, it is not anticipated that the patient will be medically stable for discharge from the hospital within 2 midnights of admission.   * I certify that at the point of admission it is my clinical judgment that the patient will require inpatient hospital care spanning beyond 2 midnights from the point of admission due to high intensity of service, high risk for further deterioration and high frequency of surveillance required.*  Author: Norval Morton, MD 06/04/2022 11:02 AM  For on call review [email or web address].

## 2022-06-04 NOTE — Anesthesia Preprocedure Evaluation (Addendum)
Anesthesia Evaluation  Patient identified by MRN, date of birth, ID band Patient awake    Reviewed: Allergy & Precautions, NPO status , Patient's Chart, lab work & pertinent test results, reviewed documented beta blocker date and time   Airway Mallampati: II  TM Distance: >3 FB Neck ROM: Full    Dental  (+) Dental Advisory Given, Teeth Intact   Pulmonary former smoker   Pulmonary exam normal breath sounds clear to auscultation       Cardiovascular hypertension, Pt. on home beta blockers and Pt. on medications + CAD, + Cardiac Stents (2016) and + CABG  Normal cardiovascular exam+ dysrhythmias Atrial Fibrillation + Valvular Problems/Murmurs (MV repair and MAZE 2005)  Rhythm:Regular Rate:Normal  TTE 2019 Left ventricle: The cavity size was normal. Systolic function was    normal. The estimated ejection fraction was in the range of 55%    to 60%. Wall motion was normal; there were no regional wall    motion abnormalities. Features are consistent with a pseudonormal    left ventricular filling pattern, with concomitant abnormal    relaxation and increased filling pressure (grade 2 diastolic    dysfunction).  - Aortic valve: Transvalvular velocity was within the normal range.    There was no stenosis. There was no regurgitation.  - Aorta: Ascending aortic diameter: 38 mm (S).  - Ascending aorta: The ascending aorta was mildly dilated.  - Mitral valve: Prior procedures included surgical repair.    Transvalvular velocity was within the normal range. There was no    evidence for stenosis. There was mild regurgitation. Valve area    by pressure half-time: 2.29 cm^2.  - Left atrium: The atrium was moderately dilated.  - Right ventricle: The cavity size was normal. Wall thickness was    normal. Systolic function was normal.  - Atrial septum: No defect or patent foramen ovale was identified.  - Tricuspid valve: There was mild regurgitation.   - Pulmonary arteries: Systolic pressure was severely increased. PA    peak pressure: 56 mm Hg (S).   Cath 2016 1. Severe 3 vessel obstructive CAD 2. Patent LIMA to the LAD 3. Patent SVG to diagonal 4. Patent SVG to OM 3 5. Occluded SVG to PDA. 6. Successful stenting of the proximal RCA with a DES.     Neuro/Psych CVA (left sided weakness), Residual Symptoms  negative psych ROS   GI/Hepatic Neg liver ROS, hiatal hernia,GERD  ,,  Endo/Other  negative endocrine ROS    Renal/GU Renal InsufficiencyRenal diseaseLab Results      Component                Value               Date                      CREATININE               1.61 (H)            06/04/2022                BUN                      18                  06/04/2022                NA  139                 06/04/2022                K                        4.0                 06/04/2022                CL                       104                 06/04/2022                CO2                      22                  06/04/2022             negative genitourinary   Musculoskeletal  (+) Arthritis ,    Abdominal   Peds  Hematology  (+) Blood dyscrasia (eliquis)   Anesthesia Other Findings   Reproductive/Obstetrics                             Anesthesia Physical Anesthesia Plan  ASA: 3  Anesthesia Plan: General   Post-op Pain Management: Tylenol PO (pre-op)*   Induction: Intravenous  PONV Risk Score and Plan: 2 and Ondansetron, Dexamethasone and Treatment may vary due to age or medical condition  Airway Management Planned: LMA  Additional Equipment:   Intra-op Plan:   Post-operative Plan: Extubation in OR  Informed Consent: I have reviewed the patients History and Physical, chart, labs and discussed the procedure including the risks, benefits and alternatives for the proposed anesthesia with the patient or authorized representative who has indicated his/her  understanding and acceptance.     Dental advisory given  Plan Discussed with: CRNA  Anesthesia Plan Comments:        Anesthesia Quick Evaluation

## 2022-06-04 NOTE — ED Notes (Signed)
Patients wife request an update,

## 2022-06-04 NOTE — ED Notes (Signed)
Patient resting in bed, provider at bedside, and transport waiting to take them to x-ray, no s/s of distress, will continue to monitor.

## 2022-06-04 NOTE — ED Triage Notes (Addendum)
Pt to ED via [facility] from home. Pt states he woke up with lower back pain which has improved.   On arrival, pt alert, denies pain, has a productive cough.   EMS VS 138/ 97%RA Cbg=138

## 2022-06-04 NOTE — Anesthesia Procedure Notes (Signed)
Procedure Name: Intubation Date/Time: 06/04/2022 2:49 PM  Performed by: Georgia Duff, CRNAPre-anesthesia Checklist: Patient identified, Emergency Drugs available, Suction available and Patient being monitored Patient Re-evaluated:Patient Re-evaluated prior to induction Oxygen Delivery Method: Circle System Utilized Preoxygenation: Pre-oxygenation with 100% oxygen Induction Type: IV induction Ventilation: Mask ventilation without difficulty Laryngoscope Size: Miller and 3 Grade View: Grade I Tube type: Oral Tube size: 7.5 mm Number of attempts: 1 Airway Equipment and Method: Stylet and Oral airway Placement Confirmation: ETT inserted through vocal cords under direct vision, positive ETCO2 and breath sounds checked- equal and bilateral Secured at: 21 cm Tube secured with: Tape Dental Injury: Teeth and Oropharynx as per pre-operative assessment

## 2022-06-05 ENCOUNTER — Encounter (HOSPITAL_COMMUNITY): Payer: Self-pay | Admitting: Urology

## 2022-06-05 DIAGNOSIS — N201 Calculus of ureter: Secondary | ICD-10-CM

## 2022-06-05 LAB — BASIC METABOLIC PANEL
Anion gap: 13 (ref 5–15)
BUN: 21 mg/dL (ref 8–23)
CO2: 20 mmol/L — ABNORMAL LOW (ref 22–32)
Calcium: 8.8 mg/dL — ABNORMAL LOW (ref 8.9–10.3)
Chloride: 106 mmol/L (ref 98–111)
Creatinine, Ser: 1.72 mg/dL — ABNORMAL HIGH (ref 0.61–1.24)
GFR, Estimated: 37 mL/min — ABNORMAL LOW (ref >60)
Glucose, Bld: 107 mg/dL — ABNORMAL HIGH (ref 70–99)
Potassium: 4.6 mmol/L (ref 3.5–5.1)
Sodium: 139 mmol/L (ref 135–145)

## 2022-06-05 LAB — CBC
HCT: 39.3 % (ref 39.0–52.0)
Hemoglobin: 12.6 g/dL — ABNORMAL LOW (ref 13.0–17.0)
MCH: 29.9 pg (ref 26.0–34.0)
MCHC: 32.1 g/dL (ref 30.0–36.0)
MCV: 93.1 fL (ref 80.0–100.0)
Platelets: 216 10*3/uL (ref 150–400)
RBC: 4.22 MIL/uL (ref 4.22–5.81)
RDW: 14.8 % (ref 11.5–15.5)
WBC: 16 10*3/uL — ABNORMAL HIGH (ref 4.0–10.5)
nRBC: 0 % (ref 0.0–0.2)

## 2022-06-05 MED ORDER — TRAMADOL HCL 50 MG PO TABS
50.0000 mg | ORAL_TABLET | Freq: Two times a day (BID) | ORAL | Status: DC | PRN
  Administered 2022-06-05 – 2022-06-08 (×4): 50 mg via ORAL
  Filled 2022-06-05 (×4): qty 1

## 2022-06-05 MED ORDER — SODIUM CHLORIDE 0.9 % IV SOLN: INTRAVENOUS | Status: DC

## 2022-06-05 NOTE — Progress Notes (Signed)
PROGRESS NOTE    Arthur [name]  RCV:[id number] DOB: 22-Sep-1928 DOA: 06/04/2022 PCP: Kristen Loader, FNP   Brief Narrative:  Arthur [name] is a 88 y.o. male with medical history significant of hypertension, hyperlipidemia, PAF on Eliquis, s/p Maze procedure, CAD  s/p CABG, and CVA with L sided weakness who presented initially with complaints of severe lower back pain.   ED course: patient was noted to be afebrile with respirations 18-22, blood pressures 96/59 -126/80, and all other vital signs maintained.  Labs significant for WBC 10 with neutrophil count elevated, creatinine 1.61, BUN 18, and lactic acid 1.4.  Chest ray revealed chronic right basilar scarring with cardiomegaly, and no other acute abnormality.  Influenza and COVID-19 screening were negative.  Urinalysis significant for moderate hemoglobin, moderate leukocytes, positive nitrites, many bacteria, greater than 50 RBC/hpf, and greater than 50 WBCs.  CT scan of the abdomen and pelvis noted a 7 mm calculus in the proximal to mid left ureter without evidence of hydronephrosis.  Dr.Machen of urology had been consulted and plan to the patient to the operating room for left ureteral stent placement.  Patient was given normal saline IV fluids 500 mL.   Assessment & Plan:  UTI Left ureteral stone: -Status post left ureteral stent placement.  POD 1 -Continue Rocephin.  Creatinine trended up this morning to 1.7.  Will increase IV fluids and continue to monitor renal function closely. -Urology on board.  Appreciate help.  Plan is ureteroscopy as outpatient. -Maintain Foley catheter until culture results come back. -He remained afebrile however his white cells trended up -Will we will continue to monitor.  Hypertension: -BP was soft in ED.  Patient was given albumin with improvement in BP in ED.  Will continue to hold metoprolol at this time.  Continue IV fluids. -Monitor blood pressure closely  AKI on CKD stage IIIb: -Will continue to  monitor renal function closely.  Avoid nephrotoxic medication.  Continue IV fluids  Persistent A-fib on chronic anticoagulation: -Rate controlled.  Eliquis on hold.  Plan on resuming after 48 hours of procedure.  Continue to monitor closely on telemetry  Coronary artery disease status post CABG in 2005 with mitral valve repair with maze procedure.  Denies any chest pain.  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  Nonambulatory at baseline.  -Hyperlipidemia: Continue Crestor  GERD: Continue PPI  Debility/deconditioning: Has significant left-sided hemiparesis at baseline.  DVT prophylaxis: SCD Code Status: Full code Family Communication:  None present at bedside.  Plan of care discussed with patient in length and he verbalized understanding and agreed with it. Disposition Plan: To be determined  Consultants:  Urology  Procedures:  Ureter stent placement  Antimicrobials:  Rocephin  Status is: Inpatient    Subjective: Patient seen and examined.  Sleepy but arousable.  Denies any complaints.  Denies any pain, fever, chills, nausea or vomiting.  Objective: Vitals:   06/04/22 2039 06/05/22 0040 06/05/22 0534 06/05/22 0838  BP: (!) 162/99 (!) 147/80 127/67 (!) 129/92  Pulse: 96 (!) 104 77 82  Resp: '18 18 18 15  '$ Temp: (!) 97.4 F (36.3 C) 97.8 F (36.6 C) 98.2 F (36.8 C) 97.7 F (36.5 C)  TempSrc: Oral Oral Oral Oral  SpO2: 98% 96% 91% 98%    Intake/Output Summary (Last 24 hours) at 06/05/2022 1014 Last data filed at 06/05/2022 0534 Gross per 24 hour  Intake 1100 ml  Output 750 ml  Net 350 ml   There were no vitals filed for this visit.  Examination:  General exam: Appears calm and comfortable, on room air, appears weak. Respiratory system: Clear to auscultation. Respiratory effort normal. Cardiovascular system: S1 & S2 heard, RRR. No  JVD, murmurs, rubs, gallops or clicks. No pedal edema. Gastrointestinal system: Abdomen is nondistended, soft and nontender. No organomegaly or masses felt. Normal bowel sounds heard. Foley in placed.  Clear urine noted in the bag.  Some bloody urine noted in the tube. Central nervous system: Left side hemiparesis  skin: No rashes, lesions or ulcers    Data Reviewed: I have personally reviewed following labs and imaging studies  CBC: Recent Labs  Lab 06/04/22 0546 06/05/22 0155  WBC 10.0 16.0*  NEUTROABS 9.3*  --   HGB 13.9 12.6*  HCT 41.4 39.3  MCV 89.4 93.1  PLT 243 371   Basic Metabolic Panel: Recent Labs  Lab 06/04/22 0546 06/05/22 0155  NA 139 139  K 4.0 4.6  CL 104 106  CO2 22 20*  GLUCOSE 110* 107*  BUN 18 21  CREATININE 1.61* 1.72*  CALCIUM 9.4 8.8*   GFR: Estimated Creatinine Clearance: 26.2 mL/min (A) (by C-G formula based on SCr of 1.72 mg/dL (H)). Liver Function Tests: No results for input(s): "AST", "ALT", "ALKPHOS", "BILITOT", "PROT", "ALBUMIN" in the last 168 hours. No results for input(s): "LIPASE", "AMYLASE" in the last 168 hours. No results for input(s): "AMMONIA" in the last 168 hours. Coagulation Profile: No results for input(s): "INR", "PROTIME" in the last 168 hours. Cardiac Enzymes: No results for input(s): "CKTOTAL", "CKMB", "CKMBINDEX", "TROPONINI" in the last 168 hours. BNP (last 3 results) No results for input(s): "PROBNP" in the last 8760 hours. HbA1C: No results for input(s): "HGBA1C" in the last 72 hours. CBG: No results for input(s): "GLUCAP" in the last 168 hours. Lipid Profile: No results for input(s): "CHOL", "HDL", "LDLCALC", "TRIG", "CHOLHDL", "LDLDIRECT" in the last 72 hours. Thyroid Function Tests: No results for input(s): "TSH", "T4TOTAL", "FREET4", "T3FREE", "THYROIDAB" in the last 72 hours. Anemia Panel: No results for input(s): "VITAMINB12", "FOLATE", "FERRITIN", "TIBC", "IRON", "RETICCTPCT" in the last 72  hours. Sepsis Labs: Recent Labs  Lab 06/04/22 0914 06/04/22 1055  LATICACIDVEN 1.4 1.8    Recent Results (from the past 240 hour(s))  Resp panel by RT-PCR (RSV, Flu A&B, Covid) Anterior Nasal Swab     Status: None   Collection Time: 06/04/22  5:35 AM   Specimen: Anterior Nasal Swab  Result Value Ref Range Status   SARS Coronavirus 2 by RT PCR NEGATIVE NEGATIVE Final    Comment: (NOTE) SARS-CoV-2 target nucleic acids are NOT DETECTED.  The SARS-CoV-2 RNA is generally detectable in upper respiratory specimens during the acute phase of infection. The lowest concentration of SARS-CoV-2 viral copies this assay can detect is 138 copies/mL. A negative result does not preclude SARS-Cov-2 infection and should not be used as the sole basis for treatment or other patient management decisions. A negative result may occur with  improper specimen collection/handling, submission of specimen other than nasopharyngeal swab, presence of viral mutation(s) within the areas targeted by this assay, and inadequate number of viral copies(<138 copies/mL). A negative result must be combined with clinical observations, patient history, and epidemiological information. The expected result is Negative.  Fact Sheet for Patients:  EntrepreneurPulse.com.au  Fact Sheet for Healthcare Providers:  IncredibleEmployment.be  This test is no t yet approved or cleared by the [place]  States FDA and  has been authorized for detection and/or diagnosis of SARS-CoV-2 by FDA under an Emergency Use Authorization (EUA). This EUA will remain  in effect (meaning this test can be used) for the duration of the COVID-19 declaration under Section 564(b)(1) of the Act, 21 U.S.C.section 360bbb-3(b)(1), unless the authorization is terminated  or revoked sooner.       Influenza A by PCR NEGATIVE NEGATIVE Final   Influenza B by PCR NEGATIVE NEGATIVE Final    Comment: (NOTE) The Xpert Xpress  SARS-CoV-2/FLU/RSV plus assay is intended as an aid in the diagnosis of influenza from Nasopharyngeal swab specimens and should not be used as a sole basis for treatment. Nasal washings and aspirates are unacceptable for Xpert Xpress SARS-CoV-2/FLU/RSV testing.  Fact Sheet for Patients: EntrepreneurPulse.com.au  Fact Sheet for Healthcare Providers: IncredibleEmployment.be  This test is not yet approved or cleared by the [place] FDA and has been authorized for detection and/or diagnosis of SARS-CoV-2 by FDA under an Emergency Use Authorization (EUA). This EUA will remain in effect (meaning this test can be used) for the duration of the COVID-19 declaration under Section 564(b)(1) of the Act, 21 U.S.C. section 360bbb-3(b)(1), unless the authorization is terminated or revoked.     Resp Syncytial Virus by PCR NEGATIVE NEGATIVE Final    Comment: (NOTE) Fact Sheet for Patients: EntrepreneurPulse.com.au  Fact Sheet for Healthcare Providers: IncredibleEmployment.be  This test is not yet approved or cleared by the [place] FDA and has been authorized for detection and/or diagnosis of SARS-CoV-2 by FDA under an Emergency Use Authorization (EUA). This EUA will remain in effect (meaning this test can be used) for the duration of the COVID-19 declaration under Section 564(b)(1) of the Act, 21 U.S.C. section 360bbb-3(b)(1), unless the authorization is terminated or revoked.  Performed at [facility], [place] [address], [place], [place] [address]       Radiology Studies: DG C-Arm 1-60 Min-No Report  Result Date: 06/04/2022 Fluoroscopy was utilized by the requesting physician.  No radiographic interpretation.   CT Abdomen Pelvis W Contrast  Result Date: 06/04/2022 CLINICAL DATA:  Acute abdominal pain. EXAM: CT ABDOMEN AND PELVIS WITH CONTRAST TECHNIQUE: Multidetector CT imaging of the abdomen and  pelvis was performed using the standard protocol following bolus administration of intravenous contrast. RADIATION DOSE REDUCTION: This exam was performed according to the departmental dose-optimization program which includes automated exposure control, adjustment of the mA and/or kV according to patient size and/or use of iterative reconstruction technique. CONTRAST:  72m ISOVUE-370 IOPAMIDOL (ISOVUE-370) INJECTION 76% COMPARISON:  None Available. FINDINGS: Lower Chest: No acute findings. Hepatobiliary: No hepatic masses identified. Gallbladder is unremarkable. No evidence of biliary ductal dilatation. Pancreas:  No mass or inflammatory changes. Spleen: Within normal limits in size and appearance. Adrenals/Urinary Tract: No suspicious masses identified. Multiple renal calculi are again seen bilaterally measuring up to 10 mm. A 7 mm calculus is seen in the proximal to mid left ureter, however there is no evidence of hydronephrosis. Unremarkable unopacified urinary bladder. Stomach/Bowel: Moderate size hiatal hernia is seen. No evidence of obstruction, inflammatory process or abnormal fluid collections. Diverticulosis is seen mainly involving the sigmoid colon, however there is no evidence of diverticulitis. Vascular/Lymphatic: No pathologically enlarged lymph nodes. No acute vascular findings. Aortic atherosclerotic calcification incidentally noted. Reproductive:  Mildly enlarged prostate gland. Other:  None. Musculoskeletal:  No suspicious bone lesions identified. IMPRESSION: 7 mm calculus in proximal to mid left ureter, without evidence of hydronephrosis. Bilateral nephrolithiasis. Moderate hiatal hernia.  Colonic diverticulosis, without radiographic evidence of diverticulitis. Mildly enlarged prostate. Aortic Atherosclerosis (ICD10-I70.0). Electronically Signed   By: Marlaine Hind M.D.   On: 06/04/2022 09:03   CT Head Wo Contrast  Result Date: 06/04/2022 CLINICAL DATA:  Mental status change with unknown cause  EXAM: CT HEAD WITHOUT CONTRAST TECHNIQUE: Contiguous axial images were obtained from the base of the skull through the vertex without intravenous contrast. RADIATION DOSE REDUCTION: This exam was performed according to the departmental dose-optimization program which includes automated exposure control, adjustment of the mA and/or kV according to patient size and/or use of iterative reconstruction technique. COMPARISON:  08/15/2018 FINDINGS: Brain: Large remote left cerebellar and right cerebral infarcts. Generalized brain atrophy with ventriculomegaly. No acute infarct, hemorrhage, hydrocephalus, or collection. Vascular: No hyperdense vessel or unexpected calcification. Skull: Normal. Negative for fracture or focal lesion. Sinuses/Orbits: No acute finding. IMPRESSION: 1. No acute finding. 2. Extensive remote infarction and generalized brain atrophy. Electronically Signed   By: Jorje Guild M.D.   On: 06/04/2022 08:46   DG Chest 2 View  Result Date: 06/04/2022 CLINICAL DATA:  Weakness EXAM: CHEST - 2 VIEW COMPARISON:  05/24/2018 FINDINGS: Enlargement of cardiac silhouette post CABG and MVR. Atherosclerotic calcification aorta. Chronic pleural thickening or effusion RIGHT chest with scarring at minor fissure. No acute infiltrate, pleural effusion, or pneumothorax. Bones demineralized with changes of chronic RIGHT rotator cuff tear noted. IMPRESSION: Chronic RIGHT basilar scarring. Enlargement of cardiac silhouette post CABG and MVR. No acute abnormalities. Aortic Atherosclerosis (ICD10-I70.0). Electronically Signed   By: Lavonia Dana M.D.   On: 06/04/2022 08:37    Scheduled Meds:  DULoxetine  30 mg Oral Daily   montelukast  10 mg Oral QPC breakfast   pantoprazole  40 mg Oral Daily   phosphorus  1 tablet Oral QPC breakfast   rosuvastatin  20 mg Oral Daily   Continuous Infusions:  sodium chloride 75 mL/hr at 06/05/22 0801   cefTRIAXone (ROCEPHIN)  IV 1 g (06/04/22 1239)     LOS: 1 day   Time  spent: 35 minutes   Arthur Michelotti Loann Quill, MD [facility]  If 7PM-7AM, please contact night-coverage [email or web address] 06/05/2022, 10:14 AM

## 2022-06-05 NOTE — Progress Notes (Signed)
1 Day Post-Op Subjective: No acute events overnight. Creatinine slightly trended up this AM.  Objective: Vital signs in last 24 hours: Temp:  [97.4 F (36.3 C)-98.6 F (37 C)] 98.2 F (36.8 C) (12/23 0534) Pulse Rate:  [66-109] 77 (12/23 0534) Resp:  [10-20] 18 (12/23 0534) BP: (88-162)/(53-99) 127/67 (12/23 0534) SpO2:  [91 %-100 %] 91 % (12/23 0534)  Intake/Output from previous day: 12/22 0701 - 12/23 0700 In: 1100 [I.V.:1000; IV Piggyback:100] Out: 750 [Urine:750] Intake/Output this shift: No intake/output data recorded.  Physical Exam:  General: Alert and oriented CV: RRR Lungs: Clear Abdomen: Soft, ND, ATTP Ext: NT, No erythema Foley in place draining clear urine with some bloody sediment in tubing  Lab Results: Recent Labs    06/04/22 0546 06/05/22 0155  HGB 13.9 12.6*  HCT 41.4 39.3   BMET Recent Labs    06/04/22 0546 06/05/22 0155  NA 139 139  K 4.0 4.6  CL 104 106  CO2 22 20*  GLUCOSE 110* 107*  BUN 18 21  CREATININE 1.61* 1.72*  CALCIUM 9.4 8.8*     Studies/Results: DG C-Arm 1-60 Min-No Report  Result Date: 06/04/2022 Fluoroscopy was utilized by the requesting physician.  No radiographic interpretation.   CT Abdomen Pelvis W Contrast  Result Date: 06/04/2022 CLINICAL DATA:  Acute abdominal pain. EXAM: CT ABDOMEN AND PELVIS WITH CONTRAST TECHNIQUE: Multidetector CT imaging of the abdomen and pelvis was performed using the standard protocol following bolus administration of intravenous contrast. RADIATION DOSE REDUCTION: This exam was performed according to the departmental dose-optimization program which includes automated exposure control, adjustment of the mA and/or kV according to patient size and/or use of iterative reconstruction technique. CONTRAST:  46m ISOVUE-370 IOPAMIDOL (ISOVUE-370) INJECTION 76% COMPARISON:  None Available. FINDINGS: Lower Chest: No acute findings. Hepatobiliary: No hepatic masses identified. Gallbladder is  unremarkable. No evidence of biliary ductal dilatation. Pancreas:  No mass or inflammatory changes. Spleen: Within normal limits in size and appearance. Adrenals/Urinary Tract: No suspicious masses identified. Multiple renal calculi are again seen bilaterally measuring up to 10 mm. A 7 mm calculus is seen in the proximal to mid left ureter, however there is no evidence of hydronephrosis. Unremarkable unopacified urinary bladder. Stomach/Bowel: Moderate size hiatal hernia is seen. No evidence of obstruction, inflammatory process or abnormal fluid collections. Diverticulosis is seen mainly involving the sigmoid colon, however there is no evidence of diverticulitis. Vascular/Lymphatic: No pathologically enlarged lymph nodes. No acute vascular findings. Aortic atherosclerotic calcification incidentally noted. Reproductive:  Mildly enlarged prostate gland. Other:  None. Musculoskeletal:  No suspicious bone lesions identified. IMPRESSION: 7 mm calculus in proximal to mid left ureter, without evidence of hydronephrosis. Bilateral nephrolithiasis. Moderate hiatal hernia. Colonic diverticulosis, without radiographic evidence of diverticulitis. Mildly enlarged prostate. Aortic Atherosclerosis (ICD10-I70.0). Electronically Signed   By: JMarlaine HindM.D.   On: 06/04/2022 09:03   CT Head Wo Contrast  Result Date: 06/04/2022 CLINICAL DATA:  Mental status change with unknown cause EXAM: CT HEAD WITHOUT CONTRAST TECHNIQUE: Contiguous axial images were obtained from the base of the skull through the vertex without intravenous contrast. RADIATION DOSE REDUCTION: This exam was performed according to the departmental dose-optimization program which includes automated exposure control, adjustment of the mA and/or kV according to patient size and/or use of iterative reconstruction technique. COMPARISON:  08/15/2018 FINDINGS: Brain: Large remote left cerebellar and right cerebral infarcts. Generalized brain atrophy with  ventriculomegaly. No acute infarct, hemorrhage, hydrocephalus, or collection. Vascular: No hyperdense vessel or unexpected calcification. Skull: Normal. Negative  for fracture or focal lesion. Sinuses/Orbits: No acute finding. IMPRESSION: 1. No acute finding. 2. Extensive remote infarction and generalized brain atrophy. Electronically Signed   By: Jorje Guild M.D.   On: 06/04/2022 08:46   DG Chest 2 View  Result Date: 06/04/2022 CLINICAL DATA:  Weakness EXAM: CHEST - 2 VIEW COMPARISON:  05/24/2018 FINDINGS: Enlargement of cardiac silhouette post CABG and MVR. Atherosclerotic calcification aorta. Chronic pleural thickening or effusion RIGHT chest with scarring at minor fissure. No acute infiltrate, pleural effusion, or pneumothorax. Bones demineralized with changes of chronic RIGHT rotator cuff tear noted. IMPRESSION: Chronic RIGHT basilar scarring. Enlargement of cardiac silhouette post CABG and MVR. No acute abnormalities. Aortic Atherosclerosis (ICD10-I70.0). Electronically Signed   By: Lavonia Dana M.D.   On: 06/04/2022 08:37    Assessment/Plan: Arthur [name] is POD 1 s/p left ureteral stent placement for ureteral stone and UTI  --will arrange for ureteroscopy as outpatient; discussed this procedure with patient and his wife yesterday --stent placement makes obstruction a less likely cause of his creatinine rise, however if his Cr continues to up-trend could get an ultrasound to evaluate if stent is functioning properly --would maintain foley until culture results come back, from there can be maintained or discontinued per primary team   LOS: 1 day   Donald Pore MD 06/05/2022, 8:25 AM [facility]  Pager: [phone]

## 2022-06-05 NOTE — Progress Notes (Signed)
Physical Therapy Evaluation Patient Details Name: Arthur [name] MRN: [id number] DOB: 1928/08/07 Today's Date: 06/05/2022  History of Present Illness  88 yo male with onset of severe back pain was admitted on 12/22 for management and had cystoscopy for stenting of L ureter after finding  7 mm calculus.  PMHx:  CAD, CABG, CVA, L hemiparesis, PAF, HTN, HLD, maze procedure,  Clinical Impression  Pt was seen for mobility after being assisted by nursing to chair and then was fatigued.  His L side tone is worsened possibly and has issues of ROM to both legs, with flexion of knees being limited B.  Pt has splints from his family that are supportive and helpful to BLE's and will focus on using them for transfers.  Per pt, he is wheelchair level at home but can be assisted with one person typically.  Follow for strengthening and increasing ease of his mobility to make stay in SNF shorter, although his family may elect to take him home tomorrow.       Recommendations for follow up therapy are one component of a multi-disciplinary discharge planning process, led by the attending physician.  Recommendations may be updated based on patient status, additional functional criteria and insurance authorization.  Follow Up Recommendations Skilled nursing-short term rehab (<3 hours/day) Can patient physically be transported by private vehicle: No    Assistance Recommended at Discharge Frequent or constant Supervision/Assistance  Patient can return home with the following  Two people to help with walking and/or transfers;Two people to help with bathing/dressing/bathroom;Assist for transportation;Help with stairs or ramp for entrance    Equipment Recommendations None recommended by PT  Recommendations for Other Services       Functional Status Assessment Patient has had a recent decline in their functional status and demonstrates the ability to make significant improvements in function in a reasonable and  predictable amount of time.     Precautions / Restrictions Precautions Precautions: Fall Precaution Comments: has splints for LE's to manage neuro and ortho changes Restrictions Weight Bearing Restrictions: No Other Position/Activity Restrictions: L AFO, R knee brace      Mobility  Bed Mobility Overal bed mobility: Needs Assistance Bed Mobility: Sit to Supine       Sit to supine: Mod assist, +2 for physical assistance, +2 for safety/equipment   General bed mobility comments: mod two for progression to supine and repositioning and to roll for bed linen adjustment    Transfers Overall transfer level: Needs assistance Equipment used: 2 person hand held assist Transfers: Sit to/from Stand, Bed to chair/wheelchair/BSC Sit to Stand: Mod assist, +2 physical assistance, +2 safety/equipment Stand pivot transfers: Mod assist, +2 physical assistance, +2 safety/equipment         General transfer comment: first attempts with resistance and a bit of fear, and then agreed to have CNA and PT assist to stand and turn to bed    Ambulation/Gait               General Gait Details: unable  Stairs            Wheelchair Mobility    Modified Rankin (Stroke Patients Only)       Balance Overall balance assessment: Needs assistance, History of Falls Sitting-balance support: Feet supported, Bilateral upper extremity supported Sitting balance-Leahy Scale: Poor     Standing balance support: Bilateral upper extremity supported, During functional activity Standing balance-Leahy Scale: Zero  Pertinent Vitals/Pain Pain Assessment Pain Assessment: Faces Faces Pain Scale: Hurts even more Pain Location: stent location Pain Descriptors / Indicators: Guarding, Grimacing Pain Intervention(s): Limited activity within patient's tolerance, Monitored during session, Premedicated before session, Repositioned    Home Living Family/patient  expects to be discharged to:: Unsure                   Additional Comments: appropriate for SNF care currently    Prior Function Prior Level of Function : Needs assist       Physical Assist : Mobility (physical) Mobility (physical): Transfers;Bed mobility   Mobility Comments: not presently ambulatory and is transfer ring bed to chair to wheelchair for mobility       Hand Dominance   Dominant Hand: Right    Extremity/Trunk Assessment   Upper Extremity Assessment Upper Extremity Assessment: LUE deficits/detail LUE Deficits / Details: old hemi change LUE: Unable to fully assess due to pain LUE Coordination: decreased gross motor    Lower Extremity Assessment Lower Extremity Assessment: Generalized weakness;LLE deficits/detail LLE Deficits / Details: L leg with hemiparesis with both extension tone and difficulty getting foot to the floor to stand LLE Coordination: decreased gross motor    Cervical / Trunk Assessment Cervical / Trunk Assessment: Kyphotic  Communication   Communication: No difficulties  Cognition Arousal/Alertness: Awake/alert Behavior During Therapy: Anxious Overall Cognitive Status: No family/caregiver present to determine baseline cognitive functioning                                 General Comments: unsure if this is baseline        General Comments General comments (skin integrity, edema, etc.): pt is assisted to move to bed from chair and is home with a caregiver but not appearing to be a one person assist to move.  Recommending SNF for recovery of safety with one to avoid pt and caregiver injury    Exercises     Assessment/Plan    PT Assessment Patient needs continued PT services  PT Problem List Decreased strength;Decreased range of motion;Decreased activity tolerance;Decreased balance;Decreased mobility;Decreased coordination;Decreased cognition;Decreased skin integrity;Pain       PT Treatment Interventions DME  instruction;Functional mobility training;Therapeutic activities;Therapeutic exercise;Balance training;Neuromuscular re-education;Patient/family education    PT Goals (Current goals can be found in the Care Plan section)  Acute Rehab PT Goals Patient Stated Goal: to stop hurting and get stronger PT Goal Formulation: With patient Time For Goal Achievement: 06/18/22 Potential to Achieve Goals: Good    Frequency Min 2X/week     Co-evaluation               AM-PAC PT "6 Clicks" Mobility  Outcome Measure Help needed turning from your back to your side while in a flat bed without using bedrails?: A Lot Help needed moving from lying on your back to sitting on the side of a flat bed without using bedrails?: Total Help needed moving to and from a bed to a chair (including a wheelchair)?: Total Help needed standing up from a chair using your arms (e.g., wheelchair or bedside chair)?: Total Help needed to walk in hospital room?: Total Help needed climbing 3-5 steps with a railing? : Total 6 Click Score: 7    End of Session Equipment Utilized During Treatment: Gait belt;Other (comment) (B leg splints, R knee support and L AFO) Activity Tolerance: Patient limited by fatigue;Patient limited by pain;Treatment limited secondary to medical complications (  Comment) Patient left: in bed;with call bell/phone within reach;with bed alarm set;with nursing/sitter in room Nurse Communication: Mobility status PT Visit Diagnosis: Unsteadiness on feet (R26.81);Muscle weakness (generalized) (M62.81);Hemiplegia and hemiparesis;Difficulty in walking, not elsewhere classified (R26.2);History of falling (Z91.81);Other abnormalities of gait and mobility (R26.89) Hemiplegia - Right/Left: Left Hemiplegia - dominant/non-dominant: Non-dominant Hemiplegia - caused by: Unspecified    Time: 9450-3888 PT Time Calculation (min) (ACUTE ONLY): 32 min   Charges:   PT Evaluation $PT Eval Moderate Complexity: 1 Mod PT  Treatments $Therapeutic Activity: 8-22 mins       Ramond Dial 06/05/2022, 5:21 PM  Mee Hives, PT PhD Acute Rehab Dept. Number: [place] [facility] [place]

## 2022-06-06 DIAGNOSIS — N201 Calculus of ureter: Secondary | ICD-10-CM | POA: Diagnosis not present

## 2022-06-06 LAB — BASIC METABOLIC PANEL
Anion gap: 10 (ref 5–15)
BUN: 21 mg/dL (ref 8–23)
CO2: 22 mmol/L (ref 22–32)
Calcium: 8.6 mg/dL — ABNORMAL LOW (ref 8.9–10.3)
Chloride: 106 mmol/L (ref 98–111)
Creatinine, Ser: 1.55 mg/dL — ABNORMAL HIGH (ref 0.61–1.24)
GFR, Estimated: 42 mL/min — ABNORMAL LOW (ref >60)
Glucose, Bld: 109 mg/dL — ABNORMAL HIGH (ref 70–99)
Potassium: 4 mmol/L (ref 3.5–5.1)
Sodium: 138 mmol/L (ref 135–145)

## 2022-06-06 LAB — CBC
HCT: 37.4 % — ABNORMAL LOW (ref 39.0–52.0)
Hemoglobin: 12.2 g/dL — ABNORMAL LOW (ref 13.0–17.0)
MCH: 30.1 pg (ref 26.0–34.0)
MCHC: 32.6 g/dL (ref 30.0–36.0)
MCV: 92.3 fL (ref 80.0–100.0)
Platelets: 183 10*3/uL (ref 150–400)
RBC: 4.05 MIL/uL — ABNORMAL LOW (ref 4.22–5.81)
RDW: 14.9 % (ref 11.5–15.5)
WBC: 12.1 10*3/uL — ABNORMAL HIGH (ref 4.0–10.5)
nRBC: 0 % (ref 0.0–0.2)

## 2022-06-06 LAB — MAGNESIUM: Magnesium: 1.7 mg/dL (ref 1.7–2.4)

## 2022-06-06 LAB — PHOSPHORUS: Phosphorus: 2.5 mg/dL (ref 2.5–4.6)

## 2022-06-06 MED ORDER — METOPROLOL TARTRATE 25 MG PO TABS
35.0000 mg | ORAL_TABLET | Freq: Two times a day (BID) | ORAL | Status: DC
  Administered 2022-06-06 – 2022-06-10 (×9): 37.5 mg via ORAL
  Filled 2022-06-06 (×9): qty 1

## 2022-06-06 NOTE — Progress Notes (Addendum)
PROGRESS NOTE    Arthur [name]  Arthur [name] DOB: [birth year] Arthur [name] PCP: Arthur Loader, Arthur [name]   Brief Narrative:  Arthur [name] is a 88 y.o. male with medical history significant of hypertension, hyperlipidemia, PAF on Eliquis, s/p Maze procedure, CAD  s/p CABG, and CVA with L sided weakness who presented initially with complaints of severe lower back pain.   ED course: patient was noted to be afebrile with respirations 18-22, blood pressures 96/59 -126/80, and all other vital signs maintained.  Labs significant for WBC 10 with neutrophil count elevated, creatinine 1.61, BUN 18, and lactic acid 1.4.  Chest ray revealed chronic right basilar scarring with cardiomegaly, and no other acute abnormality.  Influenza and COVID-19 screening were negative.  Urinalysis significant for moderate hemoglobin, moderate leukocytes, positive nitrites, many bacteria, greater than 50 RBC/hpf, and greater than 50 WBCs.  CT scan of the abdomen and pelvis noted a 7 mm calculus in the proximal to mid left ureter without evidence of hydronephrosis.  Dr.Machen of urology had been consulted and plan to the patient to the operating room for left ureteral stent placement.  Patient was given normal saline IV fluids 500 mL.   Assessment & Plan:  UTI Left ureteral stone: -Status post left ureteral stent placement.  POD 1 -Continue Rocephin.  -Urology on board.  Appreciate help.  Plan is ureteroscopy as outpatient. -Maintain Foley catheter until culture results come back. -He remained afebrile, WBC trending down.  Start voiding trial today -Will we will continue to monitor.  Hypertension: -BP was soft in ED.  Patient was given albumin with improvement in BP in ED. BP elevated this morning.  Resume home metoprolol.  Continue to monitor blood pressure closely  AKI on CKD stage IIIb: -Renal function improved this morning.  Will continue to monitor renal function closely.  Avoid nephrotoxic medication.   Continue IV fluids  Persistent A-fib on chronic anticoagulation: -Rate controlled.  Eliquis on hold.  Plan on resuming after 48 hours of procedure.  Continue to monitor closely on telemetry -I will discuss with urology if we can resume his Eliquis  Coronary artery disease status post CABG in 2005 with mitral valve repair with maze procedure.  Denies any chest pain.  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  Nonambulatory at baseline.  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  Tylenol and tramadol as needed for pain control  -Hyperlipidemia: Continue Crestor  GERD: Continue PPI  Debility/deconditioning: Has significant left-sided hemiparesis at baseline.  PT recommended SNF.  TOC consulted  DVT prophylaxis: SCD Code Status: Full code Family Communication:  None present at bedside.  Plan of care discussed with patient in length and he verbalized understanding and agreed with it. Disposition Plan: SNF  Consultants:  Urology  Procedures:  Ureter stent placement  Antimicrobials:  Rocephin  Status is: Inpatient    Subjective: Patient seen and examined.  Alert and oriented.  Reports chronic left-sided pain due to history of a stroke and hemiplegia.  No acute events overnight.  Remained afebrile. Objective: Vitals:   06/05/22 1636 06/05/22 1946 06/06/22 0449 06/06/22 0810  BP: (!) 154/71 (!) 153/99 (!) 147/97 (!) 184/85  Pulse: (!) 101 (!) 103 94 99  Resp: '16 16 18 17  '$ Temp: 97.7 F (36.5 C) 98.2 F (36.8 C) 98.3 F (36.8 C) 98 F (36.7 C)  TempSrc: Oral Oral  Oral Oral  SpO2: 98% 97% 93% 100%    Intake/Output Summary (Last 24 hours) at 06/06/2022 1112 Last data filed at 06/06/2022 1025 Gross per 24 hour  Intake 2121.67 ml  Output 1750 ml  Net 371.67 ml    There were no vitals filed for this visit.  Examination:  General exam:  Appears calm and comfortable, on room air, appears weak. Respiratory system: Clear to auscultation. Respiratory effort normal. Cardiovascular system: S1 & S2 heard, RRR. No JVD, murmurs, rubs, gallops or clicks. No pedal edema. Gastrointestinal system: Abdomen is nondistended, soft and nontender. No organomegaly or masses felt. Normal bowel sounds heard. Foley in placed.  Clear urine noted in the bag.  Central nervous system: Left side spastic hemiparesis  skin: No rashes, lesions or ulcers    Data Reviewed: I have personally reviewed following labs and imaging studies  CBC: Recent Labs  Lab 06/04/22 0546 06/05/22 0155 06/06/22 0209  WBC 10.0 16.0* 12.1*  NEUTROABS 9.3*  --   --   HGB 13.9 12.6* 12.2*  HCT 41.4 39.3 37.4*  MCV 89.4 93.1 92.3  PLT 243 216 952    Basic Metabolic Panel: Recent Labs  Lab 06/04/22 0546 06/05/22 0155 06/06/22 0209  NA 139 139 138  K 4.0 4.6 4.0  CL 104 106 106  CO2 22 20* 22  GLUCOSE 110* 107* 109*  BUN '18 21 21  '$ CREATININE 1.61* 1.72* 1.55*  CALCIUM 9.4 8.8* 8.6*  MG  --   --  1.7  PHOS  --   --  2.5    GFR: Estimated Creatinine Clearance: 29 mL/min (A) (by C-G formula based on SCr of 1.55 mg/dL (H)). Liver Function Tests: No results for input(s): "AST", "ALT", "ALKPHOS", "BILITOT", "PROT", "ALBUMIN" in the last 168 hours. No results for input(s): "LIPASE", "AMYLASE" in the last 168 hours. No results for input(s): "AMMONIA" in the last 168 hours. Coagulation Profile: No results for input(s): "INR", "PROTIME" in the last 168 hours. Cardiac Enzymes: No results for input(s): "CKTOTAL", "CKMB", "CKMBINDEX", "TROPONINI" in the last 168 hours. BNP (last 3 results) No results for input(s): "PROBNP" in the last 8760 hours. HbA1C: No results for input(s): "HGBA1C" in the last 72 hours. CBG: No results for input(s): "GLUCAP" in the last 168 hours. Lipid Profile: No results for input(s): "CHOL", "HDL", "LDLCALC", "TRIG", "CHOLHDL",  "LDLDIRECT" in the last 72 hours. Thyroid Function Tests: No results for input(s): "TSH", "T4TOTAL", "FREET4", "T3FREE", "THYROIDAB" in the last 72 hours. Anemia Panel: No results for input(s): "VITAMINB12", "FOLATE", "FERRITIN", "TIBC", "IRON", "RETICCTPCT" in the last 72 hours. Sepsis Labs: Recent Labs  Lab 06/04/22 0914 06/04/22 1055  LATICACIDVEN 1.4 1.8     Recent Results (from the past 240 hour(s))  Resp panel by RT-PCR (RSV, Flu A&B, Covid) Anterior Nasal Swab     Status: None   Collection Time: 06/04/22  5:35 AM   Specimen: Anterior Nasal Swab  Result Value Ref Range Status   SARS Coronavirus 2 by RT PCR NEGATIVE NEGATIVE Final    Comment: (NOTE) SARS-CoV-2 target nucleic acids are NOT DETECTED.  The SARS-CoV-2 RNA is generally detectable in upper respiratory specimens during the acute phase of infection. The lowest concentration of SARS-CoV-2 viral copies this assay can detect is 138 copies/mL. A negative result does not preclude SARS-Cov-2 infection and should not be used as the sole basis for treatment or other patient management decisions. A negative result may occur with  improper specimen collection/handling, submission of specimen other than nasopharyngeal  swab, presence of viral mutation(s) within the areas targeted by this assay, and inadequate number of viral copies(<138 copies/mL). A negative result must be combined with clinical observations, patient history, and epidemiological information. The expected result is Negative.  Fact Sheet for Patients:  EntrepreneurPulse.com.au  Fact Sheet for Healthcare Providers:  IncredibleEmployment.be  This test is no t yet approved or cleared by the [place] FDA and  has been authorized for detection and/or diagnosis of SARS-CoV-2 by FDA under an Emergency Use Authorization (EUA). This EUA will remain  in effect (meaning this test can be used) for the duration of the COVID-19  declaration under Section 564(b)(1) of the Act, 21 U.S.C.section 360bbb-3(b)(1), unless the authorization is terminated  or revoked sooner.       Influenza A by PCR NEGATIVE NEGATIVE Final   Influenza B by PCR NEGATIVE NEGATIVE Final    Comment: (NOTE) The Xpert Xpress SARS-CoV-2/FLU/RSV plus assay is intended as an aid in the diagnosis of influenza from Nasopharyngeal swab specimens and should not be used as a sole basis for treatment. Nasal washings and aspirates are unacceptable for Xpert Xpress SARS-CoV-2/FLU/RSV testing.  Fact Sheet for Patients: EntrepreneurPulse.com.au  Fact Sheet for Healthcare Providers: IncredibleEmployment.be  This test is not yet approved or cleared by the [place] FDA and has been authorized for detection and/or diagnosis of SARS-CoV-2 by FDA under an Emergency Use Authorization (EUA). This EUA will remain in effect (meaning this test can be used) for the duration of the COVID-19 declaration under Section 564(b)(1) of the Act, 21 U.S.C. section 360bbb-3(b)(1), unless the authorization is terminated or revoked.     Resp Syncytial Virus by PCR NEGATIVE NEGATIVE Final    Comment: (NOTE) Fact Sheet for Patients: EntrepreneurPulse.com.au  Fact Sheet for Healthcare Providers: IncredibleEmployment.be  This test is not yet approved or cleared by the [place] FDA and has been authorized for detection and/or diagnosis of SARS-CoV-2 by FDA under an Emergency Use Authorization (EUA). This EUA will remain in effect (meaning this test can be used) for the duration of the COVID-19 declaration under Section 564(b)(1) of the Act, 21 U.S.C. section 360bbb-3(b)(1), unless the authorization is terminated or revoked.  Performed at [facility], [place] [address] [place], [place] [address]       Radiology Studies: DG C-Arm 1-60 Min-No Report  Result Date:  [name] Fluoroscopy was utilized by the requesting physician.  No radiographic interpretation.    Scheduled Meds:  DULoxetine  30 mg Oral Daily   montelukast  10 mg Oral QPC breakfast   pantoprazole  40 mg Oral Daily   phosphorus  1 tablet Oral QPC breakfast   rosuvastatin  20 mg Oral Daily   Continuous Infusions:  sodium chloride 100 mL/hr at 06/06/22 0503   cefTRIAXone (ROCEPHIN)  IV 1 g (06/05/22 1227)     LOS: 2 days   Time spent: 35 minutes   Elona Yinger Loann Quill, MD [facility]  If 7PM-7AM, please contact night-coverage [email or web address] 06/06/2022, 11:12 AM

## 2022-06-06 NOTE — Progress Notes (Signed)
Reviewed chart Cr improving C/s urine negative Leave foley in as you continue to monitor CR trend Do trial of void before d/c home during morning hours

## 2022-06-06 NOTE — Progress Notes (Signed)
Voiding trials initiated as ordered. Pt incontinent  at baseline

## 2022-06-07 DIAGNOSIS — N390 Urinary tract infection, site not specified: Secondary | ICD-10-CM | POA: Diagnosis not present

## 2022-06-07 DIAGNOSIS — B9620 Unspecified Escherichia coli [E. coli] as the cause of diseases classified elsewhere: Secondary | ICD-10-CM

## 2022-06-07 LAB — CBC
HCT: 42.4 % (ref 39.0–52.0)
Hemoglobin: 13.3 g/dL (ref 13.0–17.0)
MCH: 30.1 pg (ref 26.0–34.0)
MCHC: 31.4 g/dL (ref 30.0–36.0)
MCV: 95.9 fL (ref 80.0–100.0)
Platelets: 191 10*3/uL (ref 150–400)
RBC: 4.42 MIL/uL (ref 4.22–5.81)
RDW: 14.7 % (ref 11.5–15.5)
WBC: 10 10*3/uL (ref 4.0–10.5)
nRBC: 0 % (ref 0.0–0.2)

## 2022-06-07 LAB — BASIC METABOLIC PANEL
Anion gap: 9 (ref 5–15)
BUN: 19 mg/dL (ref 8–23)
CO2: 18 mmol/L — ABNORMAL LOW (ref 22–32)
Calcium: 8.2 mg/dL — ABNORMAL LOW (ref 8.9–10.3)
Chloride: 111 mmol/L (ref 98–111)
Creatinine, Ser: 1.47 mg/dL — ABNORMAL HIGH (ref 0.61–1.24)
GFR, Estimated: 45 mL/min — ABNORMAL LOW (ref >60)
Glucose, Bld: 88 mg/dL (ref 70–99)
Potassium: 4.1 mmol/L (ref 3.5–5.1)
Sodium: 138 mmol/L (ref 135–145)

## 2022-06-07 LAB — URINE CULTURE: Culture: >=100000 — AB

## 2022-06-07 MED ORDER — POLYETHYLENE GLYCOL 3350 17 G PO PACK
17.0000 g | PACK | Freq: Every day | ORAL | Status: DC
  Administered 2022-06-07 – 2022-06-10 (×4): 17 g via ORAL
  Filled 2022-06-07 (×4): qty 1

## 2022-06-07 MED ORDER — CHLORHEXIDINE GLUCONATE CLOTH 2 % EX PADS
6.0000 | MEDICATED_PAD | Freq: Every day | CUTANEOUS | Status: DC
  Administered 2022-06-07: 6 via TOPICAL

## 2022-06-07 NOTE — Progress Notes (Signed)
PROGRESS NOTE    Arthur [name]  JHE:[id number] DOB: 17-Dec-1928 DOA: 06/04/2022 PCP: Kristen Loader, FNP   Brief Narrative:  Arthur [name] is a 88 y.o. male with medical history significant of hypertension, hyperlipidemia, PAF on Eliquis, s/p Maze procedure, CAD  s/p CABG, and CVA with L sided weakness who presented initially with complaints of severe lower back pain.   ED course: patient was noted to be afebrile with respirations 18-22, blood pressures 96/59 -126/80, and all other vital signs maintained.  Labs significant for WBC 10 with neutrophil count elevated, creatinine 1.61, BUN 18, and lactic acid 1.4.  Chest ray revealed chronic right basilar scarring with cardiomegaly, and no other acute abnormality.  Influenza and COVID-19 screening were negative.  Urinalysis significant for moderate hemoglobin, moderate leukocytes, positive nitrites, many bacteria, greater than 50 RBC/hpf, and greater than 50 WBCs.  CT scan of the abdomen and pelvis noted a 7 mm calculus in the proximal to mid left ureter without evidence of hydronephrosis.  Dr.Machen of urology had been consulted and plan to the patient to the operating room for left ureteral stent placement.  Patient was given normal saline IV fluids 500 mL.   Assessment & Plan:  E. coli UTI Left ureteral stone: -Status post left ureteral stent placement.  POD 3 -Urology on board.  Appreciate help.  Plan is ureteroscopy as outpatient. -Urine culture positive for E. coli and sensitive to Rocephin. -He remained afebrile, WBC trending down.  Start voiding trial on 12/24 -Will we will continue to monitor.  Hypertension: -BP was soft in ED.  Patient was given albumin with improvement in BP in ED. BP elevated this morning.  Resume home metoprolol.  Continue to monitor blood pressure closely  AKI on CKD stage IIIb: -Renal function improving.  Will continue to monitor renal function closely.  Avoid nephrotoxic medication.  Continue IV  fluids  Persistent A-fib on chronic anticoagulation: -Rate controlled.  Eliquis on hold.   Continue to monitor closely on telemetry -Discussed with urology-recommended resume Eliquis tomorrow on 12/26.  Coronary artery disease status post CABG in 2005 with mitral valve repair with maze procedure.  Denies any chest pain.  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  Nonambulatory at baseline.  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  Tylenol and tramadol as needed for pain control  -Hyperlipidemia: Continue Crestor  GERD: Continue PPI  Debility/deconditioning: Has significant left-sided hemiparesis at baseline.  PT recommended SNF.  TOC consulted  DVT prophylaxis: SCD Code Status: Full code Family Communication:  None present at bedside.  Plan of care discussed with patient in length and he verbalized understanding and agreed with it. Disposition Plan: SNF  Consultants:  Urology  Procedures:  Ureter stent placement  Antimicrobials:  Rocephin  Status is: Inpatient    Subjective: Patient seen and examined.  Reports that he feels better this morning.  No fever.  Denies nausea, vomiting.  No acute events overnight.  Objective: Vitals:   06/06/22 0810 06/06/22 1529 06/07/22 0457 06/07/22 0737  BP: (!) 184/85 129/83  (!) 170/108  Pulse: 99 (!) 55 90 87  Resp: '17 17 18 '$ (!) 22  Temp: 98 F (36.7 C) 98.2 F (36.8 C) 97.9 F (36.6 C) (!) 97.5 F (36.4 C)  TempSrc: Oral Oral Oral Oral  SpO2: 100% 94% 100% 96%  Weight:  78.2 kg     Intake/Output Summary (Last 24 hours) at 06/07/2022 0928 Last data filed at 06/07/2022 7564 Gross per 24 hour  Intake --  Output 2000 ml  Net -2000 ml    Filed Weights   06/07/22 0457  Weight: 78.2 kg    Examination:  General exam: Appears calm and comfortable, on  room air, appears weak. Respiratory system: Clear to auscultation. Respiratory effort normal. Cardiovascular system: S1 & S2 heard, RRR. No JVD, murmurs, rubs, gallops or clicks. No pedal edema. Gastrointestinal system: Abdomen is nondistended, soft and nontender. No organomegaly or masses felt. Normal bowel sounds heard. Foley in placed.  Clear urine noted in the bag.  Central nervous system: Left side spastic hemiparesis  skin: No rashes, lesions or ulcers    Data Reviewed: I have personally reviewed following labs and imaging studies  CBC: Recent Labs  Lab 06/04/22 0546 06/05/22 0155 06/06/22 0209 06/07/22 0229  WBC 10.0 16.0* 12.1* 10.0  NEUTROABS 9.3*  --   --   --   HGB 13.9 12.6* 12.2* 13.3  HCT 41.4 39.3 37.4* 42.4  MCV 89.4 93.1 92.3 95.9  PLT 243 216 183 332    Basic Metabolic Panel: Recent Labs  Lab 06/04/22 0546 06/05/22 0155 06/06/22 0209 06/07/22 0229  NA 139 139 138 138  K 4.0 4.6 4.0 4.1  CL 104 106 106 111  CO2 22 20* 22 18*  GLUCOSE 110* 107* 109* 88  BUN '18 21 21 19  '$ CREATININE 1.61* 1.72* 1.55* 1.47*  CALCIUM 9.4 8.8* 8.6* 8.2*  MG  --   --  1.7  --   PHOS  --   --  2.5  --     GFR: Estimated Creatinine Clearance: 30.6 mL/min (A) (by C-G formula based on SCr of 1.47 mg/dL (H)). Liver Function Tests: No results for input(s): "AST", "ALT", "ALKPHOS", "BILITOT", "PROT", "ALBUMIN" in the last 168 hours. No results for input(s): "LIPASE", "AMYLASE" in the last 168 hours. No results for input(s): "AMMONIA" in the last 168 hours. Coagulation Profile: No results for input(s): "INR", "PROTIME" in the last 168 hours. Cardiac Enzymes: No results for input(s): "CKTOTAL", "CKMB", "CKMBINDEX", "TROPONINI" in the last 168 hours. BNP (last 3 results) No results for input(s): "PROBNP" in the last 8760 hours. HbA1C: No results for input(s): "HGBA1C" in the last 72 hours. CBG: No results for input(s): "GLUCAP" in the last 168 hours. Lipid Profile: No  results for input(s): "CHOL", "HDL", "LDLCALC", "TRIG", "CHOLHDL", "LDLDIRECT" in the last 72 hours. Thyroid Function Tests: No results for input(s): "TSH", "T4TOTAL", "FREET4", "T3FREE", "THYROIDAB" in the last 72 hours. Anemia Panel: No results for input(s): "VITAMINB12", "FOLATE", "FERRITIN", "TIBC", "IRON", "RETICCTPCT" in the last 72 hours. Sepsis Labs: Recent Labs  Lab 06/04/22 0914 06/04/22 1055  LATICACIDVEN 1.4 1.8     Recent Results (from the past 240 hour(s))  Resp panel by RT-PCR (RSV, Flu A&B, Covid) Anterior Nasal Swab     Status: None   Collection Time: 06/04/22  5:35 AM   Specimen: Anterior Nasal Swab  Result Value Ref Range Status   SARS Coronavirus 2 by RT PCR NEGATIVE NEGATIVE Final    Comment: (NOTE) SARS-CoV-2 target nucleic acids are NOT DETECTED.  The SARS-CoV-2 RNA is generally detectable in upper respiratory specimens during the acute phase of infection. The lowest concentration of SARS-CoV-2 viral copies this assay can detect is 138 copies/mL. A negative result does not preclude SARS-Cov-2 infection and should not be used as the sole  basis for treatment or other patient management decisions. A negative result may occur with  improper specimen collection/handling, submission of specimen other than nasopharyngeal swab, presence of viral mutation(s) within the areas targeted by this assay, and inadequate number of viral copies(<138 copies/mL). A negative result must be combined with clinical observations, patient history, and epidemiological information. The expected result is Negative.  Fact Sheet for Patients:  EntrepreneurPulse.com.au  Fact Sheet for Healthcare Providers:  IncredibleEmployment.be  This test is no t yet approved or cleared by the [place] FDA and  has been authorized for detection and/or diagnosis of SARS-CoV-2 by FDA under an Emergency Use Authorization (EUA). This EUA will remain  in  effect (meaning this test can be used) for the duration of the COVID-19 declaration under Section 564(b)(1) of the Act, 21 U.S.C.section 360bbb-3(b)(1), unless the authorization is terminated  or revoked sooner.       Influenza A by PCR NEGATIVE NEGATIVE Final   Influenza B by PCR NEGATIVE NEGATIVE Final    Comment: (NOTE) The Xpert Xpress SARS-CoV-2/FLU/RSV plus assay is intended as an aid in the diagnosis of influenza from Nasopharyngeal swab specimens and should not be used as a sole basis for treatment. Nasal washings and aspirates are unacceptable for Xpert Xpress SARS-CoV-2/FLU/RSV testing.  Fact Sheet for Patients: EntrepreneurPulse.com.au  Fact Sheet for Healthcare Providers: IncredibleEmployment.be  This test is not yet approved or cleared by the [place] FDA and has been authorized for detection and/or diagnosis of SARS-CoV-2 by FDA under an Emergency Use Authorization (EUA). This EUA will remain in effect (meaning this test can be used) for the duration of the COVID-19 declaration under Section 564(b)(1) of the Act, 21 U.S.C. section 360bbb-3(b)(1), unless the authorization is terminated or revoked.     Resp Syncytial Virus by PCR NEGATIVE NEGATIVE Final    Comment: (NOTE) Fact Sheet for Patients: EntrepreneurPulse.com.au  Fact Sheet for Healthcare Providers: IncredibleEmployment.be  This test is not yet approved or cleared by the [place] FDA and has been authorized for detection and/or diagnosis of SARS-CoV-2 by FDA under an Emergency Use Authorization (EUA). This EUA will remain in effect (meaning this test can be used) for the duration of the COVID-19 declaration under Section 564(b)(1) of the Act, 21 U.S.C. section 360bbb-3(b)(1), unless the authorization is terminated or revoked.  Performed at [facility], [place] [address] [place], [place] [address]   Urine Culture      Status: Abnormal   Collection Time: 06/04/22  9:53 AM   Specimen: Urine, Clean Catch  Result Value Ref Range Status   Specimen Description URINE, CLEAN CATCH  Final   Special Requests   Final    NONE Performed at [facility], [place] [address] [place], [place] [address]    Culture >=100,000 COLONIES/mL ESCHERICHIA COLI (A)  Final   Report Status 06/07/2022 FINAL  Final   Organism ID, Bacteria ESCHERICHIA COLI (A)  Final      Susceptibility   Escherichia coli - MIC*    AMPICILLIN 8 SENSITIVE Sensitive     CEFAZOLIN <=4 SENSITIVE Sensitive     CEFEPIME <=0.12 SENSITIVE Sensitive     CEFTRIAXONE <=0.25 SENSITIVE Sensitive     CIPROFLOXACIN <=0.25 SENSITIVE Sensitive     GENTAMICIN <=1 SENSITIVE Sensitive     IMIPENEM <=0.25 SENSITIVE Sensitive     NITROFURANTOIN <=16 SENSITIVE Sensitive     TRIMETH/SULFA <=20 SENSITIVE Sensitive     AMPICILLIN/SULBACTAM <=2 SENSITIVE Sensitive     PIP/TAZO <=  4 SENSITIVE Sensitive     * >=100,000 COLONIES/mL ESCHERICHIA COLI      Radiology Studies: No results found.  Scheduled Meds:  DULoxetine  30 mg Oral Daily   metoprolol tartrate  37.5 mg Oral BID   montelukast  10 mg Oral QPC breakfast   pantoprazole  40 mg Oral Daily   phosphorus  1 tablet Oral QPC breakfast   rosuvastatin  20 mg Oral Daily   Continuous Infusions:  sodium chloride 100 mL/hr at 06/07/22 0336   cefTRIAXone (ROCEPHIN)  IV 1 g (06/06/22 1259)     LOS: 3 days   Time spent: 35 minutes   Gurtha Picker Loann Quill, MD [facility]  If 7PM-7AM, please contact night-coverage [email or web address] 06/07/2022, 9:28 AM

## 2022-06-08 DIAGNOSIS — N390 Urinary tract infection, site not specified: Secondary | ICD-10-CM | POA: Diagnosis not present

## 2022-06-08 LAB — BASIC METABOLIC PANEL
Anion gap: 9 (ref 5–15)
BUN: 13 mg/dL (ref 8–23)
CO2: 20 mmol/L — ABNORMAL LOW (ref 22–32)
Calcium: 8.3 mg/dL — ABNORMAL LOW (ref 8.9–10.3)
Chloride: 110 mmol/L (ref 98–111)
Creatinine, Ser: 1.3 mg/dL — ABNORMAL HIGH (ref 0.61–1.24)
GFR, Estimated: 52 mL/min — ABNORMAL LOW (ref >60)
Glucose, Bld: 92 mg/dL (ref 70–99)
Potassium: 3.8 mmol/L (ref 3.5–5.1)
Sodium: 139 mmol/L (ref 135–145)

## 2022-06-08 LAB — CBC
HCT: 38.8 % — ABNORMAL LOW (ref 39.0–52.0)
Hemoglobin: 12.4 g/dL — ABNORMAL LOW (ref 13.0–17.0)
MCH: 29.3 pg (ref 26.0–34.0)
MCHC: 32 g/dL (ref 30.0–36.0)
MCV: 91.7 fL (ref 80.0–100.0)
Platelets: 217 10*3/uL (ref 150–400)
RBC: 4.23 MIL/uL (ref 4.22–5.81)
RDW: 14.6 % (ref 11.5–15.5)
WBC: 8.8 10*3/uL (ref 4.0–10.5)
nRBC: 0 % (ref 0.0–0.2)

## 2022-06-08 MED ORDER — AMLODIPINE BESYLATE 10 MG PO TABS
10.0000 mg | ORAL_TABLET | Freq: Every day | ORAL | Status: DC
  Administered 2022-06-08 – 2022-06-10 (×3): 10 mg via ORAL
  Filled 2022-06-08 (×3): qty 1

## 2022-06-08 MED ORDER — APIXABAN 5 MG PO TABS
5.0000 mg | ORAL_TABLET | Freq: Two times a day (BID) | ORAL | Status: DC
  Administered 2022-06-08 – 2022-06-10 (×5): 5 mg via ORAL
  Filled 2022-06-08 (×5): qty 1

## 2022-06-08 MED ORDER — CEFADROXIL 500 MG PO CAPS
500.0000 mg | ORAL_CAPSULE | Freq: Two times a day (BID) | ORAL | Status: DC
  Administered 2022-06-09 – 2022-06-10 (×3): 500 mg via ORAL
  Filled 2022-06-08 (×3): qty 1

## 2022-06-08 NOTE — Progress Notes (Signed)
PROGRESS NOTE    Arthur [name]  JQZ:[id number] DOB: 07/02/1928 DOA: 06/04/2022 PCP: Kristen Loader, FNP   Brief Narrative:  Arthur [name] is a 88 y.o. male with medical history significant of hypertension, hyperlipidemia, PAF on Eliquis, s/p Maze procedure, CAD  s/p CABG, and CVA with L sided weakness who presented initially with complaints of severe lower back pain.   ED course: patient was noted to be afebrile with respirations 18-22, blood pressures 96/59 -126/80, and all other vital signs maintained.  Labs significant for WBC 10 with neutrophil count elevated, creatinine 1.61, BUN 18, and lactic acid 1.4.  Chest ray revealed chronic right basilar scarring with cardiomegaly, and no other acute abnormality.  Influenza and COVID-19 screening were negative.  Urinalysis significant for moderate hemoglobin, moderate leukocytes, positive nitrites, many bacteria, greater than 50 RBC/hpf, and greater than 50 WBCs.  CT scan of the abdomen and pelvis noted a 7 mm calculus in the proximal to mid left ureter without evidence of hydronephrosis.  Dr.Machen of urology had been consulted and plan to the patient to the operating room for left ureteral stent placement.  Patient was given normal saline IV fluids 500 mL.   Assessment & Plan:  UTI Left ureteral stone: -Status post left ureteral stent placement.  POD 1 -culture growing pan-sensitive e coli. Ceftriaxone started 12/22. Will convert to duricef to complete oral course -Urology on board.  Appreciate help.  Plan is ureteroscopy as outpatient. -voiding trial to start today -He remained afebrile, WBC trending down.  Start voiding trial today -Will we will continue to monitor.  Hypertension: Bps elevated.  - cont home metop - stop fluids - start amlodipine  AKI on CKD stage IIIb: -cr at baseline of 1.3  Persistent A-fib on chronic anticoagulation: -Rate controlled.  Eliquis on hold.  Plan on resuming after 48 hours of procedure.  Continue  to monitor closely on telemetry -re-start apixaban today  Coronary artery disease status post CABG in 2005 with mitral valve repair with maze procedure.  Denies any chest pain.  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  Nonambulatory at baseline.  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  Tylenol and tramadol as needed for pain control  -Hyperlipidemia: Continue Crestor  GERD: Continue PPI  Debility/deconditioning: Has significant left-sided hemiparesis at baseline.  PT recommended SNF.  TOC consulted  DVT prophylaxis: SCD Code Status: DNR confirmed with wife at bedside Family Communication:  wife updated @ bedside 12/26 Disposition Plan: SNF  Consultants:  Urology  Procedures:  Ureter stent placement  Antimicrobials:  Rocephin>duricef  Status is: Inpatient    Subjective: Patient seen and examined.  Occasional left sided colicky pain Objective: Vitals:   06/07/22 2032 06/07/22 2230 06/08/22 0516 06/08/22 0849  BP: (!) 197/106 (!) 174/121 (!) 179/119 (!) 173/118  Pulse: 79 80 90 (!) 47  Resp: '20  20 16  '$ Temp: 98.5 F (36.9 C)  97.7 F (36.5 C) 98.6 F (37 C)  TempSrc: Oral  Oral Oral  SpO2: 97%  99% 92%  Weight:        Intake/Output Summary (Last 24 hours) at 06/08/2022 1305 Last data filed at 06/08/2022 1122 Gross per 24 hour  Intake 5061.34 ml  Output 5350 ml  Net -288.66 ml   Filed Weights   06/07/22 0457  Weight: 78.2 kg    Examination:  General exam: Appears  calm and comfortable, on room air, appears weak. Respiratory system: Clear to auscultation. Respiratory effort normal. Cardiovascular system: S1 & S2 heard, RRR. No JVD, murmurs, rubs, gallops or clicks. No pedal edema. Gastrointestinal system: Abdomen is nondistended, soft and nontender. No organomegaly or masses felt. Normal bowel sounds heard. Foley in  placed.  Clear urine noted in the bag.  Central nervous system: Left side spastic hemiparesis  skin: No rashes, lesions or ulcers    Data Reviewed: I have personally reviewed following labs and imaging studies  CBC: Recent Labs  Lab 06/04/22 0546 06/05/22 0155 06/06/22 0209 06/07/22 0229 06/08/22 0301  WBC 10.0 16.0* 12.1* 10.0 8.8  NEUTROABS 9.3*  --   --   --   --   HGB 13.9 12.6* 12.2* 13.3 12.4*  HCT 41.4 39.3 37.4* 42.4 38.8*  MCV 89.4 93.1 92.3 95.9 91.7  PLT 243 216 183 191 546   Basic Metabolic Panel: Recent Labs  Lab 06/04/22 0546 06/05/22 0155 06/06/22 0209 06/07/22 0229 06/08/22 0301  NA 139 139 138 138 139  K 4.0 4.6 4.0 4.1 3.8  CL 104 106 106 111 110  CO2 22 20* 22 18* 20*  GLUCOSE 110* 107* 109* 88 92  BUN '18 21 21 19 13  '$ CREATININE 1.61* 1.72* 1.55* 1.47* 1.30*  CALCIUM 9.4 8.8* 8.6* 8.2* 8.3*  MG  --   --  1.7  --   --   PHOS  --   --  2.5  --   --    GFR: Estimated Creatinine Clearance: 34.6 mL/min (A) (by C-G formula based on SCr of 1.3 mg/dL (H)). Liver Function Tests: No results for input(s): "AST", "ALT", "ALKPHOS", "BILITOT", "PROT", "ALBUMIN" in the last 168 hours. No results for input(s): "LIPASE", "AMYLASE" in the last 168 hours. No results for input(s): "AMMONIA" in the last 168 hours. Coagulation Profile: No results for input(s): "INR", "PROTIME" in the last 168 hours. Cardiac Enzymes: No results for input(s): "CKTOTAL", "CKMB", "CKMBINDEX", "TROPONINI" in the last 168 hours. BNP (last 3 results) No results for input(s): "PROBNP" in the last 8760 hours. HbA1C: No results for input(s): "HGBA1C" in the last 72 hours. CBG: No results for input(s): "GLUCAP" in the last 168 hours. Lipid Profile: No results for input(s): "CHOL", "HDL", "LDLCALC", "TRIG", "CHOLHDL", "LDLDIRECT" in the last 72 hours. Thyroid Function Tests: No results for input(s): "TSH", "T4TOTAL", "FREET4", "T3FREE", "THYROIDAB" in the last 72 hours. Anemia  Panel: No results for input(s): "VITAMINB12", "FOLATE", "FERRITIN", "TIBC", "IRON", "RETICCTPCT" in the last 72 hours. Sepsis Labs: Recent Labs  Lab 06/04/22 0914 06/04/22 1055  LATICACIDVEN 1.4 1.8    Recent Results (from the past 240 hour(s))  Resp panel by RT-PCR (RSV, Flu A&B, Covid) Anterior Nasal Swab     Status: None   Collection Time: 06/04/22  5:35 AM   Specimen: Anterior Nasal Swab  Result Value Ref Range Status   SARS Coronavirus 2 by RT PCR NEGATIVE NEGATIVE Final    Comment: (NOTE) SARS-CoV-2 target nucleic acids are NOT DETECTED.  The SARS-CoV-2 RNA is generally detectable in upper respiratory specimens during the acute phase of infection. The lowest concentration of SARS-CoV-2 viral copies this assay can detect is 138 copies/mL. A negative result does not preclude SARS-Cov-2 infection and should not be used as the sole basis for treatment or other patient management decisions. A negative result may occur with  improper specimen collection/handling, submission of specimen other than nasopharyngeal swab, presence of viral mutation(s) within the areas targeted  by this assay, and inadequate number of viral copies(<138 copies/mL). A negative result must be combined with clinical observations, patient history, and epidemiological information. The expected result is Negative.  Fact Sheet for Patients:  EntrepreneurPulse.com.au  Fact Sheet for Healthcare Providers:  IncredibleEmployment.be  This test is no t yet approved or cleared by the [place] FDA and  has been authorized for detection and/or diagnosis of SARS-CoV-2 by FDA under an Emergency Use Authorization (EUA). This EUA will remain  in effect (meaning this test can be used) for the duration of the COVID-19 declaration under Section 564(b)(1) of the Act, 21 U.S.C.section 360bbb-3(b)(1), unless the authorization is terminated  or revoked sooner.       Influenza A by  PCR NEGATIVE NEGATIVE Final   Influenza B by PCR NEGATIVE NEGATIVE Final    Comment: (NOTE) The Xpert Xpress SARS-CoV-2/FLU/RSV plus assay is intended as an aid in the diagnosis of influenza from Nasopharyngeal swab specimens and should not be used as a sole basis for treatment. Nasal washings and aspirates are unacceptable for Xpert Xpress SARS-CoV-2/FLU/RSV testing.  Fact Sheet for Patients: EntrepreneurPulse.com.au  Fact Sheet for Healthcare Providers: IncredibleEmployment.be  This test is not yet approved or cleared by the [place] FDA and has been authorized for detection and/or diagnosis of SARS-CoV-2 by FDA under an Emergency Use Authorization (EUA). This EUA will remain in effect (meaning this test can be used) for the duration of the COVID-19 declaration under Section 564(b)(1) of the Act, 21 U.S.C. section 360bbb-3(b)(1), unless the authorization is terminated or revoked.     Resp Syncytial Virus by PCR NEGATIVE NEGATIVE Final    Comment: (NOTE) Fact Sheet for Patients: EntrepreneurPulse.com.au  Fact Sheet for Healthcare Providers: IncredibleEmployment.be  This test is not yet approved or cleared by the [place] FDA and has been authorized for detection and/or diagnosis of SARS-CoV-2 by FDA under an Emergency Use Authorization (EUA). This EUA will remain in effect (meaning this test can be used) for the duration of the COVID-19 declaration under Section 564(b)(1) of the Act, 21 U.S.C. section 360bbb-3(b)(1), unless the authorization is terminated or revoked.  Performed at [facility], [place] [address], [place], [place] [address]   Urine Culture     Status: Abnormal   Collection Time: 06/04/22  9:53 AM   Specimen: Urine, Clean Catch  Result Value Ref Range Status   Specimen Description URINE, CLEAN CATCH  Final   Special Requests   Final    NONE Performed at [facility], [place] [address] [place], [place] [address]    Culture >=100,000 COLONIES/mL ESCHERICHIA COLI (A)  Final   Report Status 06/07/2022 FINAL  Final   Organism ID, Bacteria ESCHERICHIA COLI (A)  Final      Susceptibility   Escherichia coli - MIC*    AMPICILLIN 8 SENSITIVE Sensitive     CEFAZOLIN <=4 SENSITIVE Sensitive     CEFEPIME <=0.12 SENSITIVE Sensitive     CEFTRIAXONE <=0.25 SENSITIVE Sensitive     CIPROFLOXACIN <=0.25 SENSITIVE Sensitive     GENTAMICIN <=1 SENSITIVE Sensitive     IMIPENEM <=0.25 SENSITIVE Sensitive     NITROFURANTOIN <=16 SENSITIVE Sensitive     TRIMETH/SULFA <=20 SENSITIVE Sensitive     AMPICILLIN/SULBACTAM <=2 SENSITIVE Sensitive     PIP/TAZO <=4 SENSITIVE Sensitive     * >=100,000 COLONIES/mL ESCHERICHIA COLI      Radiology Studies: No results found.  Scheduled Meds:  [START ON 06/09/2022] cefadroxil  1,000 mg  Oral BID   Chlorhexidine Gluconate Cloth  6 each Topical Daily   DULoxetine  30 mg Oral Daily   metoprolol tartrate  37.5 mg Oral BID   montelukast  10 mg Oral QPC breakfast   pantoprazole  40 mg Oral Daily   phosphorus  1 tablet Oral QPC breakfast   polyethylene glycol  17 g Oral Daily   rosuvastatin  20 mg Oral Daily   Continuous Infusions:     LOS: 4 days   Time spent: 35 minutes   Desma Maxim, MD [facility]  If 7PM-7AM, please contact night-coverage [email or web address] 06/08/2022, 1:05 PM

## 2022-06-08 NOTE — Care Management Important Message (Signed)
Important Message  Patient Details  Name: Arthur [name] MRN: [id number] Date of Birth: 12-Mar-1929   Medicare Important Message Given:  Yes     Kenyah Luba [address] 06/08/2022, 3:58 PM

## 2022-06-08 NOTE — Progress Notes (Signed)
ANTICOAGULATION CONSULT NOTE - Initial Consult  Pharmacy Consult for Eliquis Indication: atrial fibrillation  Allergies  Allergen Reactions   Sulfamethoxazole-Trimethoprim     Other reaction(s): Unknown   Tape Other (See Comments)    PATIENT IS TAKING COUMADIN; HIS SKIN TEARS & BRUISES EASILY; PLEASE USE COBAN WRAP OR AN ALTERNATIVE TO MEDICAL TAPE!!   Lasix [Furosemide] Rash   Penicillins Rash    Has patient had a PCN reaction causing immediate rash, facial/tongue/throat swelling, SOB or lightheadedness with hypotension: Yes Has patient had a PCN reaction causing severe rash involving mucus membranes or skin necrosis: Unknown Has patient had a PCN reaction that required hospitalization: Unknown Has patient had a PCN reaction occurring within the last 10 years: unknown If all of the above answers are "NO", then may proceed with Cephalosporin use.    Sulfa Drugs Cross Reactors Rash    Patient Measurements: Weight: 78.2 kg (172 lb 6.4 oz)  Vital Signs: Temp: 98.6 F (37 C) (12/26 0849) Temp Source: Oral (12/26 0849) BP: 173/118 (12/26 0849) Pulse Rate: 47 (12/26 0849)  Labs: Recent Labs    06/06/22 0209 06/07/22 0229 06/08/22 0301  HGB 12.2* 13.3 12.4*  HCT 37.4* 42.4 38.8*  PLT 183 191 217  CREATININE 1.55* 1.47* 1.30*    Estimated Creatinine Clearance: 34.6 mL/min (A) (by C-G formula based on SCr of 1.3 mg/dL (H)).   Medical History: Past Medical History:  Diagnosis Date   Arthritis    "some in my joints" (01/24/2015)   CKD (chronic kidney disease), stage II    Coronary artery disease    a. s/p CABG in 2005 with MV repair and MAZE procedure. b. s/p DES to prox RCA 01/2015.    CVA (cerebrovascular accident) ([place]) ~ 2014   RIGHT BRAIN; denies residual on 01/24/2015   Esophagitis    Distal esophagitis   GERD (gastroesophageal reflux disease)    Heart murmur    History of hiatal hernia    History of recurrent TIAs    Hyperlipidemia    Hypertension     Hypertensive vascular disease    MVP (mitral valve prolapse)    Nephrolithiasis    Odynophagia    PAF (paroxysmal atrial fibrillation) (HCC)    Transudative pleural effusion     Medications:  Scheduled:   amLODipine  10 mg Oral Daily   apixaban  5 mg Oral BID   [START ON 06/09/2022] cefadroxil  500 mg Oral BID   Chlorhexidine Gluconate Cloth  6 each Topical Daily   DULoxetine  30 mg Oral Daily   metoprolol tartrate  37.5 mg Oral BID   montelukast  10 mg Oral QPC breakfast   pantoprazole  40 mg Oral Daily   phosphorus  1 tablet Oral QPC breakfast   polyethylene glycol  17 g Oral Daily   rosuvastatin  20 mg Oral Daily    Assessment: 88 yo male presented w/ severe lower back pain found to have UTI and ureteral stone s/p L ureteral stent placement 12/22. Patient on Eliquis '5mg'$  BID PTA for hx Afib - Eliquis was held for urology procedure. Ok to resume Eliquis 48h post-procedure per urology.   Goal of Therapy:  Monitor platelets by anticoagulation protocol: Yes   Plan:  Resume Eliquis '5mg'$  PO BID (weight > 60kg, Scr < 1.5) Monitor CBC and s/sx bleeding  Pharmacy to formally sign off consult but will continue to monitor peripherally.  Dimple Nanas, PharmD, BCPS 06/08/2022 1:29 PM

## 2022-06-08 NOTE — TOC Initial Note (Signed)
Transition of Care [facility]) - Initial/Assessment Note    Patient Details  Name: Arthur [name] MRN: [id number] Date of Birth: 12-30-1928  Transition of Care [facility]) CM/SW Contact:    Coralee Pesa, [place] Phone Number: 06/08/2022, 12:21 PM  Clinical Narrative:                 CSW met with pt, spouse, and congregational nurse at bedside. CSW advised of SNF rec and spouse agreeable, noting pt was at [facility] several years ago. Pt HOH, and defers to spouse and nurse for decision making. CSW advised pt has personal care aides for 4 hours in the morning and four hours at night. Pt was at [facility] for the full 100 days when he had a stroke. CSW discussed insurance benefits and restrictions. Pt will not need an insurance authorization prior to DC and has already met the three midnight stay requirements. CSW was advised that pt can usually pivot, but is unable to now. Discussed discharge transport and was advised pt has a wheelchair  and it can be provided for transport if appropriate. Congregational nurse noted that pt has good community support, but their son passed away and their grandchildren live out of state. Congregational nurse stating pt cannot return home at this level. CSW explained that it will be up to insurance how long pt will stay at SNF. CSW asked about LTC options and Congregational nurse states she has been working with them to put supports in place. CSW to fax pt out, and provide Medicare list at bedside. TOC will continue to follow for DC needs.   Expected Discharge Plan: Skilled Nursing Facility Barriers to Discharge: SNF Pending bed offer, Continued Medical Work up   Patient Goals and CMS Choice Patient states their goals for this hospitalization and ongoing recovery are:: Pt HOH and not seeming to follow conversation at this time, spouse would like for pt to return home, stronger. CMS Medicare.gov Compare Post Acute Care list provided to:: Patient Choice offered to / list  presented to : Patient, Spouse      Expected Discharge Plan and Services       Living arrangements for the past 2 months: Single Family Home                                      Prior Living Arrangements/Services Living arrangements for the past 2 months: Single Family Home Lives with:: Spouse Patient language and need for interpreter reviewed:: Yes Do you feel safe going back to the place where you live?: Yes      Need for Family Participation in Patient Care: Yes (Comment) Care giver support system in place?: Yes (comment) Current home services: DME, Homehealth aide Criminal Activity/Legal Involvement Pertinent to Current Situation/Hospitalization: No - Comment as needed  Activities of Daily Living      Permission Sought/Granted Permission sought to share information with : Family Supports Permission granted to share information with : Yes, Verbal Permission Granted  Share Information with NAME: Arthur [name]     Permission granted to share info w Relationship: Spouse  Permission granted to share info w Contact Information: [phone]  Emotional Assessment Appearance:: Appears stated age Attitude/Demeanor/Rapport: Engaged Affect (typically observed): Pleasant Orientation: : Oriented to Self, Oriented to Place, Oriented to  Time, Oriented to Situation Alcohol / Substance Use: Not Applicable Psych Involvement: No (comment)  Admission diagnosis:  Kidney stone [N20.0] UTI (urinary tract  infection) [N39.0] Urinary tract infection with hematuria, site unspecified [N39.0, R31.9] Patient Active Problem List   Diagnosis Date Noted   UTI (urinary tract infection) 06/04/2022   Kidney stone 06/04/2022   Transient hypotension 06/04/2022   Renal insufficiency 06/04/2022   Persistent atrial fibrillation ([place]) 06/04/2022   GERD (gastroesophageal reflux disease) 06/04/2022   Pressure injury of skin 05/23/2018   Hypokalemia 05/22/2018   Hyperglycemia 05/22/2018   Sepsis  ([place]) 05/21/2018   Leukocytosis    Acute blood loss anemia    Hypernatremia    Hypophosphatemia    Irritation of left eye    Stage 3 chronic kidney disease (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 [place] [address] [place] [address] Phone: [phone] Fax: [phone]  [facility] [id number] [place], [place] - [place] [address] Phone: [phone] Fax: [phone]     Social Determinants of Health (SDOH) Social History: SDOH Screenings   Tobacco Use: Medium Risk (06/05/2022)   SDOH Interventions:     Readmission Risk Interventions     No data to display

## 2022-06-08 NOTE — NC FL2 (Signed)
[place] LEVEL OF CARE FORM     IDENTIFICATION  Patient Name: Arthur [name] Birthdate: [birth year] Sex: male Admission Date (Current Location): 06/04/2022  [facility] [place] Number:  Herbalist and Address:  [facility] [place], [place] [address], [place], [place] [address]      Provider Number: [id number]  Attending Physician Name and Address:  Gwynne Edinger, MD  Relative Name and Phone Number:  Kadden Osterhout  [phone]    Current Level of Care: Hospital Recommended Level of Care: [place] Prior Approval Number:    Date Approved/Denied:   PASRR Number: [id number]  Discharge Plan: SNF    Current Diagnoses: Patient Active Problem List   Diagnosis Date Noted   UTI (urinary tract infection) 06/04/2022   Kidney stone 06/04/2022   Transient hypotension 06/04/2022   Renal insufficiency 06/04/2022   Persistent atrial fibrillation ([place]) 06/04/2022   GERD (gastroesophageal reflux disease) 06/04/2022   Pressure injury of skin 05/23/2018   Hypokalemia 05/22/2018   Hyperglycemia 05/22/2018   Sepsis ([place]) 05/21/2018   Leukocytosis    Acute blood loss anemia    Hypernatremia    Hypophosphatemia    Irritation of left eye    Stage 3 chronic kidney disease ([place])    Acute ischemic right ACA stroke ([place]) 07/15/2017   Left spastic hemiparesis ([place]) 07/13/2017   Ischemic stroke ([place]) 07/11/2017   History of stroke with residual deficit 07/11/2017   Acute respiratory failure (HCC)    Abnormal nuclear stress test 01/24/2015   Long term current use of anticoagulant 05/28/2011   Dizziness 05/14/2011   Hyperlipidemia    Hypertension    Coronary artery disease    Paroxysmal atrial fibrillation with RVR (HCC)    CKD (chronic kidney disease), stage III (HCC)    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 (HCC)     Orientation RESPIRATION BLADDER Height & Weight     Self, Time, Situation, Place  Normal  Incontinent, Indwelling catheter Weight: 172 lb 6.4 oz (78.2 kg) Height:     BEHAVIORAL SYMPTOMS/MOOD NEUROLOGICAL BOWEL NUTRITION STATUS      Continent Diet (See Dc summary)  AMBULATORY STATUS COMMUNICATION OF NEEDS Skin   Extensive Assist Verbally Normal                       Personal Care Assistance Level of Assistance  Bathing, Feeding, Dressing Bathing Assistance: Maximum assistance Feeding assistance: Limited assistance Dressing Assistance: Maximum assistance     Functional Limitations Info  Sight, Hearing, Speech Sight Info: Impaired Hearing Info: Impaired Speech Info: Adequate    SPECIAL CARE FACTORS FREQUENCY  PT (By licensed PT), OT (By licensed OT)     PT Frequency: 5x week OT Frequency: 5x week            Contractures Contractures Info: Not present    Additional Factors Info  Code Status, Allergies, Psychotropic Code Status Info: Full Allergies Info: Sulfamethoxazole-trimethoprim  Tape  Lasix (Furosemide)  Penicillins  Sulfa Drugs Cross Reactors Psychotropic Info: Duloxetine         Current Medications (06/08/2022):  This is the current hospital active medication list Current Facility-Administered Medications  Medication Dose Route Frequency Provider Last Rate Last Admin   0.9 %  sodium chloride infusion   Intravenous Continuous Pahwani, Rinka R, MD 100 mL/hr at 06/07/22 2316 New Bag at 06/07/22 2316   acetaminophen (TYLENOL) tablet 650 mg  650 mg Oral Q6H PRN  Vira Agar, MD   650 mg at 06/06/22 2023   Or   acetaminophen (TYLENOL) suppository 650 mg  650 mg Rectal Q6H PRN Vira Agar, MD       albuterol (PROVENTIL) (2.5 MG/3ML) 0.083% nebulizer solution 2.5 mg  2.5 mg Nebulization Q6H PRN Vira Agar, MD       cefTRIAXone (ROCEPHIN) 1 g in sodium chloride 0.9 % 100 mL IVPB  1 g Intravenous Q24H Vira Agar, MD 200 mL/hr at 06/08/22 1122 1 g at 06/08/22 1122   Chlorhexidine Gluconate Cloth 2 % PADS 6 each  6 each Topical Daily  Pahwani, Rinka R, MD   6 each at 06/07/22 1233   DULoxetine (CYMBALTA) DR capsule 30 mg  30 mg Oral Daily Kathye Cipriani, Rondell A, MD   30 mg at 06/08/22 1126   metoprolol tartrate (LOPRESSOR) tablet 37.5 mg  37.5 mg Oral BID Pahwani, Rinka R, MD   37.5 mg at 06/08/22 1126   montelukast (SINGULAIR) tablet 10 mg  10 mg Oral QPC breakfast Fuller Plan A, MD   10 mg at 06/08/22 1127   ondansetron (ZOFRAN) tablet 4 mg  4 mg Oral Q6H PRN Vira Agar, MD       Or   ondansetron (ZOFRAN) injection 4 mg  4 mg Intravenous Q6H PRN Vira Agar, MD       pantoprazole (PROTONIX) EC tablet 40 mg  40 mg Oral Daily Ronin Crager, Rondell A, MD   40 mg at 06/08/22 1127   phosphorus (K PHOS NEUTRAL) tablet 1 tablet  1 tablet Oral QPC breakfast Fuller Plan A, MD   1 tablet at 06/08/22 1127   polyethylene glycol (MIRALAX / GLYCOLAX) packet 17 g  17 g Oral Daily Pahwani, Rinka R, MD   17 g at 06/08/22 1127   rosuvastatin (CRESTOR) tablet 20 mg  20 mg Oral Daily Dewanna Hurston, Rondell A, MD   20 mg at 06/08/22 1126   traMADol (ULTRAM) tablet 50 mg  50 mg Oral Q12H PRN Pahwani, Rinka R, MD   50 mg at 06/07/22 1305     Discharge Medications: Please see discharge summary for a list of discharge medications.  Relevant Imaging Results:  Relevant Lab Results:   Additional Information SSN: [id number]  Coralee Pesa, [place]

## 2022-06-08 NOTE — Progress Notes (Signed)
PHARMACY NOTE:  ANTIMICROBIAL RENAL DOSAGE ADJUSTMENT  Current antimicrobial regimen includes a mismatch between antimicrobial dosage and estimated renal function.  As per policy approved by the Pharmacy & Therapeutics and Medical Executive Committees, the antimicrobial dosage will be adjusted accordingly.  Current antimicrobial dosage:  cefadroxil '1000mg'$  PO q12h  Indication: UTI  Renal Function:  Estimated Creatinine Clearance: 34.6 mL/min (A) (by C-G formula based on SCr of 1.3 mg/dL (H)). '[]'$      On intermittent HD, scheduled: '[]'$      On CRRT    Antimicrobial dosage has been changed to:  cefadroxil '500mg'$  PO q12h  Additional comments:   Thank you for allowing pharmacy to be a part of this patient's care.  Dimple Nanas, PharmD, BCPS 06/08/2022 1:23 PM

## 2022-06-09 DIAGNOSIS — R319 Hematuria, unspecified: Secondary | ICD-10-CM | POA: Diagnosis not present

## 2022-06-09 DIAGNOSIS — N200 Calculus of kidney: Secondary | ICD-10-CM | POA: Diagnosis not present

## 2022-06-09 DIAGNOSIS — N390 Urinary tract infection, site not specified: Secondary | ICD-10-CM | POA: Diagnosis not present

## 2022-06-09 LAB — BASIC METABOLIC PANEL
Anion gap: 10 (ref 5–15)
BUN: 13 mg/dL (ref 8–23)
CO2: 23 mmol/L (ref 22–32)
Calcium: 8.8 mg/dL — ABNORMAL LOW (ref 8.9–10.3)
Chloride: 104 mmol/L (ref 98–111)
Creatinine, Ser: 1.29 mg/dL — ABNORMAL HIGH (ref 0.61–1.24)
GFR, Estimated: 52 mL/min — ABNORMAL LOW (ref >60)
Glucose, Bld: 105 mg/dL — ABNORMAL HIGH (ref 70–99)
Potassium: 3.4 mmol/L — ABNORMAL LOW (ref 3.5–5.1)
Sodium: 137 mmol/L (ref 135–145)

## 2022-06-09 MED ORDER — POTASSIUM CHLORIDE CRYS ER 20 MEQ PO TBCR
20.0000 meq | EXTENDED_RELEASE_TABLET | Freq: Once | ORAL | Status: AC
  Administered 2022-06-09: 20 meq via ORAL
  Filled 2022-06-09: qty 1

## 2022-06-09 NOTE — Plan of Care (Signed)
  Problem: Education: Goal: Knowledge of General Education information will improve Description Including pain rating scale, medication(s)/side effects and non-pharmacologic comfort measures Outcome: Progressing   

## 2022-06-09 NOTE — TOC Progression Note (Addendum)
Transition of Care [facility]) - Progression Note    Patient Details  Name: Arthur [name] MRN: [id number] Date of Birth: April 18, 1929  Transition of Care [facility]) CM/SW [place], [place] Phone Number: 06/09/2022, 3:05 PM  Clinical Narrative:     CSW requested [facility] review pt, as he was there for over a month last time, waiting for their decision. TOC will continue to follow for DC needs.   CSW met with spouse in room and bed offers provided. Spouse asking about [facility], and was advised they have not responded. Spouse to review other options, but would like to see if [facility] offers before making a decision. Spouse to follow up with CSW tomorrow morning along with congregational nurse to finalize DC choice. TOC will continue to follow.   Expected Discharge Plan: [place] Barriers to Discharge: SNF Pending bed offer, Continued Medical Work up  Expected Discharge Plan and [place] arrangements for the past 2 months: Single Family Home                                       Social Determinants of Health (SDOH) Interventions SDOH Screenings   Tobacco Use: Medium Risk (06/05/2022)    Readmission Risk Interventions     No data to display

## 2022-06-09 NOTE — Progress Notes (Signed)
Physical Therapy Treatment Patient Details Name: Arthur [name] MRN: [id number] DOB: [birth year] Today's Date: 06/09/2022   History of Present Illness 88 yo male with onset of severe back pain was admitted on 12/22 for management and had cystoscopy for stenting of L ureter after finding 7 mm calculus.  PMHx: CAD, CABG, CVA, L hemiparesis, PAF, HTN, HLD, maze procedure.    PT Comments    Pt received in recliner, agreeable to transfer back to bed from chair, pt having eaten most of his dinner and nursing requesting assist due to pt needing 2-person assist. Pt needing up to +2 maxA for return transfer from low bed height<>bari Stedy and from elevated Stedy seat as pt has difficulty fully extending his hips/trunk to move seat flaps out of the way.  Pt with good effort throughout but R knee pain limiting his standing tolerance. He may benefit from OT consult for LUE pain/contracture mgmt strategies, MD notified. Pt continues to benefit from PT services to progress toward functional mobility goals.   Recommendations for follow up therapy are one component of a multi-disciplinary discharge planning process, led by the attending physician.  Recommendations may be updated based on patient status, additional functional criteria and insurance authorization.  Follow Up Recommendations  Skilled nursing-short term rehab (<3 hours/day) Can patient physically be transported by private vehicle: No   Assistance Recommended at Discharge Frequent or constant Supervision/Assistance  Patient can return home with the following Two people to help with walking and/or transfers;Two people to help with bathing/dressing/bathroom;Assist for transportation;Help with stairs or ramp for entrance   Equipment Recommendations  None recommended by PT    Recommendations for Other Services OT consult (MD notified he would benefit)     Precautions / Restrictions Precautions Precautions: Fall Precaution Comments: has braces to  manage neuro and ortho changes Restrictions Weight Bearing Restrictions: No Other Position/Activity Restrictions: L AFO, R knee brace     Mobility  Bed Mobility Overal bed mobility: Needs Assistance Bed Mobility: Sit to Supine     Supine to sit: Max assist, HOB elevated Sit to supine: Max assist, +2 for physical assistance   General bed mobility comments: from R side of bed, cues for self-assist and railing use, pt needing maxA for L HB mgmt and totalA for laterally scooting hips once in supine    Transfers Overall transfer level: Needs assistance Equipment used: Ambulation equipment used Transfers: Sit to/from Stand, Bed to chair/wheelchair/BSC Sit to Stand: +2 physical assistance, +2 safety/equipment, Max assist, From elevated surface           General transfer comment: Heavy assist to extend hips enough for placement of Stedy flaps, RN present as well to assist. Pt standing partially with +2 modA but needs +2 maxA to reach full upright posture in Stedy frame and to stand from elevated Stedy seat. Transfer via Lift Equipment: Stedy  Ambulation/Gait               General Gait Details: unable   Marine scientist [place] (Stroke Patients Only)       Balance Overall balance assessment: Needs assistance, History of Falls Sitting-balance support: Feet supported, Bilateral upper extremity supported Sitting balance-Leahy Scale: Poor     Standing balance support: Bilateral upper extremity supported, During functional activity Standing balance-Leahy Scale: Zero Standing balance comment: +2 maxA to fully extend trunk/hips in [place] x2 reps  Cognition Arousal/Alertness: Awake/alert Behavior During Therapy: WFL for tasks assessed/performed Overall Cognitive Status: No family/caregiver present to determine baseline cognitive functioning                                  General Comments: Pt following instructions with R hemi-body well, agreeable and participatory as able.        Exercises Other Exercises Other Exercises: supine: encouraged ankle/knee/hip ROMAT three sets of 10 reps throughout the day    General Comments General comments (skin integrity, edema, etc.): L AFO and R knee brace doffed once pt seated EOB after transfer via Stedy. Placed back in pt suitcase.      Pertinent Vitals/Pain Pain Assessment Pain Assessment: Faces Faces Pain Scale: Hurts even more Pain Location: LUE (shoulder) with AA/PROM into flexion for transfers, R knee pain (chronic) with WB Pain Descriptors / Indicators: Guarding, Grimacing Pain Intervention(s): Monitored during session, Repositioned    Home Living                          Prior Function            PT Goals (current goals can now be found in the care plan section) Acute Rehab PT Goals Patient Stated Goal: to stop hurting and get stronger PT Goal Formulation: With patient Time For Goal Achievement: 06/18/22 Progress towards PT goals: Progressing toward goals    Frequency    Min 2X/week      PT Plan Current plan remains appropriate    Co-evaluation              AM-PAC PT "6 Clicks" Mobility   Outcome Measure  Help needed turning from your back to your side while in a flat bed without using bedrails?: A Lot Help needed moving from lying on your back to sitting on the side of a flat bed without using bedrails?: A Lot Help needed moving to and from a bed to a chair (including a wheelchair)?: Total (+2) Help needed standing up from a chair using your arms (e.g., wheelchair or bedside chair)?: Total (+2) Help needed to walk in hospital room?: Total Help needed climbing 3-5 steps with a railing? : Total 6 Click Score: 8    End of Session Equipment Utilized During Treatment: Gait belt;Other (comment) (R knee support and L AFO) Activity Tolerance: Patient tolerated treatment  well Patient left: with call bell/phone within reach;Other (comment);in bed;with bed alarm set (B heels floated, HOB >30* since he plans to drink more water) Nurse Communication: Mobility status;Need for lift equipment;Other (comment) (probably safest to use maxi-move for OOB transfers, or bari Stedy (regular Charlaine Dalton was difficult to get flaps in place)) PT Visit Diagnosis: Unsteadiness on feet (R26.81);Muscle weakness (generalized) (M62.81);Hemiplegia and hemiparesis;Difficulty in walking, not elsewhere classified (R26.2);History of falling (Z91.81);Other abnormalities of gait and mobility (R26.89) Hemiplegia - Right/Left: Left Hemiplegia - dominant/non-dominant: Non-dominant Hemiplegia - caused by: Unspecified     Time: 7902-4097 PT Time Calculation (min) (ACUTE ONLY): 13 min  Charges:  $Therapeutic Activity: 8-22 mins                     Marlea Gambill P., PTA Acute Rehabilitation Services Secure Chat Preferred 9a-5:30pm Office: [place] 06/09/2022, 7:08 PM

## 2022-06-09 NOTE — Progress Notes (Signed)
PROGRESS NOTE    Arthur [name]  WVP:[id number] DOB: 11-Apr-1929 DOA: 06/04/2022 PCP: Kristen Loader, FNP   Brief Narrative:  Arthur [name] is a 88 y.o. male with medical history significant of hypertension, hyperlipidemia, PAF on Eliquis, s/p Maze procedure, CAD  s/p CABG, and CVA with L sided weakness who presented initially with complaints of severe lower back pain.   ED course: patient was noted to be afebrile with respirations 18-22, blood pressures 96/59 -126/80, and all other vital signs maintained.  Labs significant for WBC 10 with neutrophil count elevated, creatinine 1.61, BUN 18, and lactic acid 1.4.  Chest ray revealed chronic right basilar scarring with cardiomegaly, and no other acute abnormality.  Influenza and COVID-19 screening were negative.  Urinalysis significant for moderate hemoglobin, moderate leukocytes, positive nitrites, many bacteria, greater than 50 RBC/hpf, and greater than 50 WBCs.  CT scan of the abdomen and pelvis noted a 7 mm calculus in the proximal to mid left ureter without evidence of hydronephrosis.  Dr.Machen of urology had been consulted and patient underwent  left ureteral stent placement.    Assessment & Plan:  UTI Left ureteral stone: -Status post left ureteral stent placement on 12/22. -Culture growing pan-sensitive e coli. Ceftriaxone started 12/22. Will convert to duricef to complete oral course -Urology on board.  Appreciate help.  Plan is ureteroscopy as outpatient. -Voiding trial 12/26. He has been urinating.  -He remained afebrile, WBC trending down.   Awaiting placement.   Hypertension: - Cont home metop - Started  amlodipine -BP better controlled.   AKI on CKD stage IIIb: -cr at baseline of 1.3 -stable.   Persistent A-fib on chronic anticoagulation: -Rate controlled.  Eliquis was held pre procedure.  -Re-started  apixaban 12/26.   Coronary artery disease status post CABG in 2005 with mitral valve repair with maze procedure.   Denies any chest pain.  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  Nonambulatory at baseline.  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  Tylenol and tramadol as needed for pain control  -Hyperlipidemia: Continue Crestor  GERD: Continue PPI  Debility/deconditioning: Has significant left-sided hemiparesis at baseline.  PT recommended SNF.  TOC consulted  Hypokalemia; replete orally.   DVT prophylaxis: SCD Code Status: DNR confirmed with wife at bedside Family Communication:  wife updated @ bedside 12/26 Disposition Plan: SNF  Consultants:  Urology  Procedures:  Ureter stent placement  Antimicrobials:  Rocephin>duricef  Status is: Inpatient    Subjective: He report back pain controlled with pain medications. He needs pain medication occasional.  He has  been abel to urinate.   Objective: Vitals:   06/08/22 2222 06/09/22 0606 06/09/22 0727 06/09/22 1540  BP: (!) 180/106 (!) 155/96 (!) 152/93 134/84  Pulse: 91 85 (!) 105 81  Resp: '18 18 16 16  '$ Temp: 98 F (36.7 C) 97.6 F (36.4 C) 98.4 F (36.9 C) 97.8 F (36.6 C)  TempSrc: Oral Oral Oral Oral  SpO2: 98% 100% 91% 96%  Weight:        Intake/Output Summary (Last 24 hours) at 06/09/2022 1711 Last data filed at 06/09/2022 5462 Gross per 24 hour  Intake 240 ml  Output 1300 ml  Net -1060 ml    Filed Weights   06/07/22 0457  Weight: 78.2 kg    Examination:  General exam: NAD Respiratory system: CTA Cardiovascular system: S 1, S  2 RRR Gastrointestinal system: BS present, soft, nt Central nervous system: Left side spastic hemiparesis    Data Reviewed: I have personally reviewed following labs and imaging studies  CBC: Recent Labs  Lab 06/04/22 0546 06/05/22 0155 06/06/22 0209 06/07/22 0229 06/08/22 0301  WBC 10.0 16.0* 12.1* 10.0 8.8  NEUTROABS 9.3*  --   --   --    --   HGB 13.9 12.6* 12.2* 13.3 12.4*  HCT 41.4 39.3 37.4* 42.4 38.8*  MCV 89.4 93.1 92.3 95.9 91.7  PLT 243 216 183 191 160    Basic Metabolic Panel: Recent Labs  Lab 06/05/22 0155 06/06/22 0209 06/07/22 0229 06/08/22 0301 06/09/22 0233  NA 139 138 138 139 137  K 4.6 4.0 4.1 3.8 3.4*  CL 106 106 111 110 104  CO2 20* 22 18* 20* 23  GLUCOSE 107* 109* 88 92 105*  BUN '21 21 19 13 13  '$ CREATININE 1.72* 1.55* 1.47* 1.30* 1.29*  CALCIUM 8.8* 8.6* 8.2* 8.3* 8.8*  MG  --  1.7  --   --   --   PHOS  --  2.5  --   --   --     GFR: Estimated Creatinine Clearance: 34.9 mL/min (A) (by C-G formula based on SCr of 1.29 mg/dL (H)). Liver Function Tests: No results for input(s): "AST", "ALT", "ALKPHOS", "BILITOT", "PROT", "ALBUMIN" in the last 168 hours. No results for input(s): "LIPASE", "AMYLASE" in the last 168 hours. No results for input(s): "AMMONIA" in the last 168 hours. Coagulation Profile: No results for input(s): "INR", "PROTIME" in the last 168 hours. Cardiac Enzymes: No results for input(s): "CKTOTAL", "CKMB", "CKMBINDEX", "TROPONINI" in the last 168 hours. BNP (last 3 results) No results for input(s): "PROBNP" in the last 8760 hours. HbA1C: No results for input(s): "HGBA1C" in the last 72 hours. CBG: No results for input(s): "GLUCAP" in the last 168 hours. Lipid Profile: No results for input(s): "CHOL", "HDL", "LDLCALC", "TRIG", "CHOLHDL", "LDLDIRECT" in the last 72 hours. Thyroid Function Tests: No results for input(s): "TSH", "T4TOTAL", "FREET4", "T3FREE", "THYROIDAB" in the last 72 hours. Anemia Panel: No results for input(s): "VITAMINB12", "FOLATE", "FERRITIN", "TIBC", "IRON", "RETICCTPCT" in the last 72 hours. Sepsis Labs: Recent Labs  Lab 06/04/22 0914 06/04/22 1055  LATICACIDVEN 1.4 1.8     Recent Results (from the past 240 hour(s))  Resp panel by RT-PCR (RSV, Flu A&B, Covid) Anterior Nasal Swab     Status: None   Collection Time: 06/04/22  5:35 AM    Specimen: Anterior Nasal Swab  Result Value Ref Range Status   SARS Coronavirus 2 by RT PCR NEGATIVE NEGATIVE Final    Comment: (NOTE) SARS-CoV-2 target nucleic acids are NOT DETECTED.  The SARS-CoV-2 RNA is generally detectable in upper respiratory specimens during the acute phase of infection. The lowest concentration of SARS-CoV-2 viral copies this assay can detect is 138 copies/mL. A negative result does not preclude SARS-Cov-2 infection and should not be used as the sole basis for treatment or other patient management decisions. A negative result may occur with  improper specimen collection/handling, submission of specimen other than nasopharyngeal swab, presence of viral mutation(s) within the areas targeted by this assay, and inadequate number of viral copies(<138 copies/mL). A negative result must be combined with clinical observations, patient history, and epidemiological information. The expected result is Negative.  Fact Sheet for Patients:  EntrepreneurPulse.com.au  Fact Sheet for Healthcare Providers:  IncredibleEmployment.be  This test is no t yet approved or cleared by the [place]  States FDA and  has been authorized for detection and/or diagnosis of SARS-CoV-2 by FDA under an Emergency Use Authorization (EUA). This EUA will remain  in effect (meaning this test can be used) for the duration of the COVID-19 declaration under Section 564(b)(1) of the Act, 21 U.S.C.section 360bbb-3(b)(1), unless the authorization is terminated  or revoked sooner.       Influenza A by PCR NEGATIVE NEGATIVE Final   Influenza B by PCR NEGATIVE NEGATIVE Final    Comment: (NOTE) The Xpert Xpress SARS-CoV-2/FLU/RSV plus assay is intended as an aid in the diagnosis of influenza from Nasopharyngeal swab specimens and should not be used as a sole basis for treatment. Nasal washings and aspirates are unacceptable for Xpert Xpress  SARS-CoV-2/FLU/RSV testing.  Fact Sheet for Patients: EntrepreneurPulse.com.au  Fact Sheet for Healthcare Providers: IncredibleEmployment.be  This test is not yet approved or cleared by the [place] FDA and has been authorized for detection and/or diagnosis of SARS-CoV-2 by FDA under an Emergency Use Authorization (EUA). This EUA will remain in effect (meaning this test can be used) for the duration of the COVID-19 declaration under Section 564(b)(1) of the Act, 21 U.S.C. section 360bbb-3(b)(1), unless the authorization is terminated or revoked.     Resp Syncytial Virus by PCR NEGATIVE NEGATIVE Final    Comment: (NOTE) Fact Sheet for Patients: EntrepreneurPulse.com.au  Fact Sheet for Healthcare Providers: IncredibleEmployment.be  This test is not yet approved or cleared by the [place] FDA and has been authorized for detection and/or diagnosis of SARS-CoV-2 by FDA under an Emergency Use Authorization (EUA). This EUA will remain in effect (meaning this test can be used) for the duration of the COVID-19 declaration under Section 564(b)(1) of the Act, 21 U.S.C. section 360bbb-3(b)(1), unless the authorization is terminated or revoked.  Performed at [facility], [place] [address], [place], [place] [address]   Urine Culture     Status: Abnormal   Collection Time: 06/04/22  9:53 AM   Specimen: Urine, Clean Catch  Result Value Ref Range Status   Specimen Description URINE, CLEAN CATCH  Final   Special Requests   Final    NONE Performed at [facility], [place] [address], [place], [place] [address]    Culture >=100,000 COLONIES/mL ESCHERICHIA COLI (A)  Final   Report Status 06/07/2022 FINAL  Final   Organism ID, Bacteria ESCHERICHIA COLI (A)  Final      Susceptibility   Escherichia coli - MIC*    AMPICILLIN 8 SENSITIVE Sensitive     CEFAZOLIN <=4 SENSITIVE Sensitive     CEFEPIME <=0.12  SENSITIVE Sensitive     CEFTRIAXONE <=0.25 SENSITIVE Sensitive     CIPROFLOXACIN <=0.25 SENSITIVE Sensitive     GENTAMICIN <=1 SENSITIVE Sensitive     IMIPENEM <=0.25 SENSITIVE Sensitive     NITROFURANTOIN <=16 SENSITIVE Sensitive     TRIMETH/SULFA <=20 SENSITIVE Sensitive     AMPICILLIN/SULBACTAM <=2 SENSITIVE Sensitive     PIP/TAZO <=4 SENSITIVE Sensitive     * >=100,000 COLONIES/mL ESCHERICHIA COLI      Radiology Studies: No results found.  Scheduled Meds:  amLODipine  10 mg Oral Daily   apixaban  5 mg Oral BID   cefadroxil  500 mg Oral BID   DULoxetine  30 mg Oral Daily   metoprolol tartrate  37.5 mg Oral BID   montelukast  10 mg Oral QPC breakfast   pantoprazole  40 mg Oral Daily   phosphorus  1 tablet Oral QPC  breakfast   polyethylene glycol  17 g Oral Daily   rosuvastatin  20 mg Oral Daily   Continuous Infusions:     LOS: 5 days   Time spent: 35 minutes   Chizaram Latino A Gelisa Tieken, MD [facility]  If 7PM-7AM, please contact night-coverage [email or web address] 06/09/2022, 5:11 PM

## 2022-06-09 NOTE — Progress Notes (Signed)
Physical Therapy Treatment Patient Details Name: Arthur [name] MRN: [id number] DOB: 11/04/1928 Today's Date: 06/09/2022   History of Present Illness 88 yo male with onset of severe back pain was admitted on 12/22 for management and had cystoscopy for stenting of L ureter after finding 7 mm calculus.  PMHx: CAD, CABG, CVA, L hemiparesis, PAF, HTN, HLD, maze procedure.    PT Comments    Pt received in supine, agreeable to therapy session with emphasis on transfer training and positioning for pressure relief. Pt with R lean seated EOB and in chair, pillows placed to promote neutral posture while he sits up in recliner to eat. Pt needing up to +2 maxA for sit<>stand from elevated bed to [facility] and maxA +1 for bed mobility. Geomat pressure relief cushion ordered to room, arrived after session, RN/pt notified to have it in place next time he gets up to chair for skin protection as he cannot easily weight shift given L hemibody weakness. RN notified lift pad under him for maxi-move lift back to bed will be safest, but braces in place on BLE in case they use Stedy with +2 to return. Pt continues to benefit from PT services to progress toward functional mobility goals.    Recommendations for follow up therapy are one component of a multi-disciplinary discharge planning process, led by the attending physician.  Recommendations may be updated based on patient status, additional functional criteria and insurance authorization.  Follow Up Recommendations  Skilled nursing-short term rehab (<3 hours/day) Can patient physically be transported by private vehicle: No   Assistance Recommended at Discharge Frequent or constant Supervision/Assistance  Patient can return home with the following Two people to help with walking and/or transfers;Two people to help with bathing/dressing/bathroom;Assist for transportation;Help with stairs or ramp for entrance   Equipment Recommendations  None recommended by PT     Recommendations for Other Services OT consult (MD notified he would benefit)     Precautions / Restrictions Precautions Precautions: Fall Precaution Comments: has braces to manage neuro and ortho changes Restrictions Weight Bearing Restrictions: No Other Position/Activity Restrictions: L AFO, R knee brace     Mobility  Bed Mobility Overal bed mobility: Needs Assistance Bed Mobility: Supine to Sit     Supine to sit: Max assist, HOB elevated     General bed mobility comments: from supine to R EOB, pt unable to significantly assist with LUE due to hx CVA so maxA for LLE and trunk lifting. Pt assisting with RLE and RUE as able. R lean upon sitting up.    Transfers Overall transfer level: Needs assistance Equipment used: Ambulation equipment used Transfers: Sit to/from Stand, Bed to chair/wheelchair/BSC Sit to Stand: +2 physical assistance, +2 safety/equipment, Max assist, From elevated surface           General transfer comment: Heavy assist to extend hips enough for placement of Stedy flaps for regular size Stedy (bari Harris unavailable at time of session). NT present and assisting with encouragement. Pt standing partially with +2 modA but needs +2 maxA to reach full upright posture in [place]. Transfer via Lift Equipment: Stedy  Ambulation/Gait               General Gait Details: unable   Marine scientist [place] (Stroke Patients Only)       Balance Overall balance assessment: Needs assistance, History of Falls Sitting-balance support: Feet supported, Bilateral upper extremity  supported Sitting balance-Leahy Scale: Poor     Standing balance support: Bilateral upper extremity supported, During functional activity Standing balance-Leahy Scale: Zero Standing balance comment: +2 maxA to fully extend trunk/hips in [place] x2 reps                            Cognition Arousal/Alertness: Awake/alert Behavior  During Therapy: [facility] for tasks assessed/performed Overall Cognitive Status: No family/caregiver present to determine baseline cognitive functioning                                 General Comments: Pt following instructions with R hemi-body well, agreeable and participatory as able.        Exercises Other Exercises Other Exercises: supine: encouraged ankle/knee/hip ROMAT three sets of 10 reps throughout the day    General Comments General comments (skin integrity, edema, etc.): L AFO and R knee brace donned prior to OOB mobility, pt reports continued R knee pain despite brace and states this is normal for him.      Pertinent Vitals/Pain Pain Assessment Pain Assessment: Faces Faces Pain Scale: Hurts even more Pain Location: LUE (shoulder) with AA/PROM into flexion for transfers, R knee pain (chronic) with WB Pain Descriptors / Indicators: Guarding, Grimacing Pain Intervention(s): Monitored during session, Repositioned, Limited activity within patient's tolerance    Home Living                          Prior Function            PT Goals (current goals can now be found in the care plan section) Acute Rehab PT Goals Patient Stated Goal: to stop hurting and get stronger PT Goal Formulation: With patient Time For Goal Achievement: 06/18/22 Progress towards PT goals: Progressing toward goals    Frequency    Min 2X/week      PT Plan Current plan remains appropriate    Co-evaluation              AM-PAC PT "6 Clicks" Mobility   Outcome Measure  Help needed turning from your back to your side while in a flat bed without using bedrails?: A Lot Help needed moving from lying on your back to sitting on the side of a flat bed without using bedrails?: A Lot Help needed moving to and from a bed to a chair (including a wheelchair)?: Total (+2) Help needed standing up from a chair using your arms (e.g., wheelchair or bedside chair)?: Total (+2) Help  needed to walk in hospital room?: Total Help needed climbing 3-5 steps with a railing? : Total 6 Click Score: 8    End of Session Equipment Utilized During Treatment: Gait belt;Other (comment) (R knee support and L AFO) Activity Tolerance: Patient tolerated treatment well Patient left: with call bell/phone within reach;in chair;with chair alarm set;Other (comment) (lift pad under him in the chair) Nurse Communication: Mobility status;Need for lift equipment;Other (comment) (probably safest to use maxi-move for return to bed, or bari Stedy (regular Charlaine Dalton was difficult to get flaps in place)) PT Visit Diagnosis: Unsteadiness on feet (R26.81);Muscle weakness (generalized) (M62.81);Hemiplegia and hemiparesis;Difficulty in walking, not elsewhere classified (R26.2);History of falling (Z91.81);Other abnormalities of gait and mobility (R26.89) Hemiplegia - Right/Left: Left Hemiplegia - dominant/non-dominant: Non-dominant Hemiplegia - caused by: Unspecified     Time: 1448-1856 PT Time Calculation (min) (ACUTE ONLY): 24 min  Charges:  $Therapeutic Activity: 23-37 mins                     Nilan Iddings P., PTA Acute Rehabilitation Services Secure Chat Preferred 9a-5:30pm Office: [place] 06/09/2022, 5:34 PM

## 2022-06-10 DIAGNOSIS — E785 Hyperlipidemia, unspecified: Secondary | ICD-10-CM | POA: Diagnosis not present

## 2022-06-10 DIAGNOSIS — I69322 Dysarthria following cerebral infarction: Secondary | ICD-10-CM | POA: Diagnosis not present

## 2022-06-10 DIAGNOSIS — Z01818 Encounter for other preprocedural examination: Secondary | ICD-10-CM | POA: Diagnosis not present

## 2022-06-10 DIAGNOSIS — Z7401 Bed confinement status: Secondary | ICD-10-CM | POA: Diagnosis not present

## 2022-06-10 DIAGNOSIS — Z7901 Long term (current) use of anticoagulants: Secondary | ICD-10-CM | POA: Diagnosis not present

## 2022-06-10 DIAGNOSIS — R531 Weakness: Secondary | ICD-10-CM | POA: Diagnosis not present

## 2022-06-10 DIAGNOSIS — N202 Calculus of kidney with calculus of ureter: Secondary | ICD-10-CM | POA: Diagnosis not present

## 2022-06-10 DIAGNOSIS — L89322 Pressure ulcer of left buttock, stage 2: Secondary | ICD-10-CM | POA: Diagnosis not present

## 2022-06-10 DIAGNOSIS — I69354 Hemiplegia and hemiparesis following cerebral infarction affecting left non-dominant side: Secondary | ICD-10-CM | POA: Diagnosis not present

## 2022-06-10 DIAGNOSIS — B9620 Unspecified Escherichia coli [E. coli] as the cause of diseases classified elsewhere: Secondary | ICD-10-CM | POA: Diagnosis not present

## 2022-06-10 DIAGNOSIS — I2510 Atherosclerotic heart disease of native coronary artery without angina pectoris: Secondary | ICD-10-CM | POA: Diagnosis not present

## 2022-06-10 DIAGNOSIS — K219 Gastro-esophageal reflux disease without esophagitis: Secondary | ICD-10-CM | POA: Diagnosis not present

## 2022-06-10 DIAGNOSIS — N1832 Chronic kidney disease, stage 3b: Secondary | ICD-10-CM | POA: Diagnosis not present

## 2022-06-10 DIAGNOSIS — N390 Urinary tract infection, site not specified: Secondary | ICD-10-CM | POA: Diagnosis not present

## 2022-06-10 DIAGNOSIS — I4819 Other persistent atrial fibrillation: Secondary | ICD-10-CM | POA: Diagnosis not present

## 2022-06-10 DIAGNOSIS — Z538 Procedure and treatment not carried out for other reasons: Secondary | ICD-10-CM | POA: Diagnosis not present

## 2022-06-10 DIAGNOSIS — I69828 Other speech and language deficits following other cerebrovascular disease: Secondary | ICD-10-CM | POA: Diagnosis not present

## 2022-06-10 DIAGNOSIS — R319 Hematuria, unspecified: Secondary | ICD-10-CM | POA: Diagnosis not present

## 2022-06-10 DIAGNOSIS — Z951 Presence of aortocoronary bypass graft: Secondary | ICD-10-CM | POA: Diagnosis not present

## 2022-06-10 DIAGNOSIS — M6281 Muscle weakness (generalized): Secondary | ICD-10-CM | POA: Diagnosis not present

## 2022-06-10 DIAGNOSIS — R2689 Other abnormalities of gait and mobility: Secondary | ICD-10-CM | POA: Diagnosis not present

## 2022-06-10 DIAGNOSIS — I10 Essential (primary) hypertension: Secondary | ICD-10-CM | POA: Diagnosis not present

## 2022-06-10 DIAGNOSIS — I480 Paroxysmal atrial fibrillation: Secondary | ICD-10-CM | POA: Diagnosis not present

## 2022-06-10 DIAGNOSIS — R1312 Dysphagia, oropharyngeal phase: Secondary | ICD-10-CM | POA: Diagnosis not present

## 2022-06-10 DIAGNOSIS — R2681 Unsteadiness on feet: Secondary | ICD-10-CM | POA: Diagnosis not present

## 2022-06-10 DIAGNOSIS — I69891 Dysphagia following other cerebrovascular disease: Secondary | ICD-10-CM | POA: Diagnosis not present

## 2022-06-10 DIAGNOSIS — I129 Hypertensive chronic kidney disease with stage 1 through stage 4 chronic kidney disease, or unspecified chronic kidney disease: Secondary | ICD-10-CM | POA: Diagnosis not present

## 2022-06-10 LAB — BASIC METABOLIC PANEL
Anion gap: 12 (ref 5–15)
BUN: 17 mg/dL (ref 8–23)
CO2: 20 mmol/L — ABNORMAL LOW (ref 22–32)
Calcium: 8.8 mg/dL — ABNORMAL LOW (ref 8.9–10.3)
Chloride: 104 mmol/L (ref 98–111)
Creatinine, Ser: 1.31 mg/dL — ABNORMAL HIGH (ref 0.61–1.24)
GFR, Estimated: 51 mL/min — ABNORMAL LOW (ref >60)
Glucose, Bld: 90 mg/dL (ref 70–99)
Potassium: 4.3 mmol/L (ref 3.5–5.1)
Sodium: 136 mmol/L (ref 135–145)

## 2022-06-10 MED ORDER — ORAL CARE MOUTH RINSE: 15.0000 mL | OROMUCOSAL | Status: DC | PRN

## 2022-06-10 MED ORDER — TRAMADOL HCL 50 MG PO TABS: 50.0000 mg | ORAL_TABLET | Freq: Two times a day (BID) | ORAL | 0 refills | Status: AC | PRN

## 2022-06-10 MED ORDER — CEFADROXIL 500 MG PO CAPS: 500.0000 mg | ORAL_CAPSULE | Freq: Two times a day (BID) | ORAL | 0 refills | Status: AC

## 2022-06-10 MED ORDER — AMLODIPINE BESYLATE 10 MG PO TABS: 10.0000 mg | ORAL_TABLET | Freq: Every day | ORAL | 0 refills | Status: AC

## 2022-06-10 MED ORDER — POLYETHYLENE GLYCOL 3350 17 G PO PACK: 17.0000 g | PACK | Freq: Every day | ORAL | 0 refills | Status: AC

## 2022-06-10 NOTE — Discharge Summary (Signed)
Physician Discharge Summary   Patient: Arthur [name] MRN: [id number] DOB: 1929/06/12  Admit date:     06/04/2022  Discharge date: 06/10/22  Discharge Physician: Elmarie Shiley   PCP: Kristen Loader, FNP   Recommendations at discharge:   Needs to follow up with Urology for urethroscopy and treatment kidney stone.    Discharge Diagnoses: Principal Problem:   UTI (urinary tract infection) Active Problems:   Kidney stone   Transient hypotension   Renal insufficiency   Long term current use of anticoagulant   Persistent atrial fibrillation (HCC)   Coronary artery disease   History of stroke with residual deficit   Hyperlipidemia   GERD (gastroesophageal reflux disease)  Resolved Problems:   * No resolved hospital problems. [facility] Course: Arthur [name] is a 88 y.o. male with medical history significant of hypertension, hyperlipidemia, PAF on Eliquis, s/p Maze procedure, CAD  s/p CABG, and CVA with L sided weakness who presented initially with complaints of severe lower back pain.    ED course: patient was noted to be afebrile with respirations 18-22, blood pressures 96/59 -126/80, and all other vital signs maintained.  Labs significant for WBC 10 with neutrophil count elevated, creatinine 1.61, BUN 18, and lactic acid 1.4.  Chest ray revealed chronic right basilar scarring with cardiomegaly, and no other acute abnormality.  Influenza and COVID-19 screening were negative.  Urinalysis significant for moderate hemoglobin, moderate leukocytes, positive nitrites, many bacteria, greater than 50 RBC/hpf, and greater than 50 WBCs.  CT scan of the abdomen and pelvis noted a 7 mm calculus in the proximal to mid left ureter without evidence of hydronephrosis.  Dr.Machen of urology had been consulted and patient underwent  left ureteral stent placement.    Assessment and Plan: UTI Left ureteral stone: -Status post left ureteral stent placement on 12/22. -Culture growing pan-sensitive  e coli. Ceftriaxone started 12/22. Will convert to duricef to complete oral course -Urology on board.  Appreciate help.  Plan is ureteroscopy as outpatient. -Voiding trial 12/26. He has been urinating.  -He remained afebrile, WBC trending down.   Awaiting placement.    Hypertension: - Cont home metop - Started  amlodipine -BP better controlled.    AKI on CKD stage IIIb: -cr at baseline of 1.3 -stable.    Persistent A-fib on chronic anticoagulation: -Rate controlled.  Eliquis was held pre procedure.  -Re-started  apixaban 12/26.    Coronary artery disease status post CABG in 2005 with mitral valve repair with maze procedure.  Denies any chest pain.   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  Nonambulatory at baseline.  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  Tylenol and tramadol as needed for pain control   -Hyperlipidemia: Continue Crestor   GERD: Continue PPI   Debility/deconditioning: Has significant left-sided hemiparesis at baseline.  PT recommended SNF.  TOC consulted   Hypokalemia; Replaced.         Consultants: Dr Cain Sieve, urology  Procedures performed: stent placement.  Disposition: Skilled nursing facility Diet recommendation:  Discharge Diet Orders (From admission, onward)     Start     Ordered   06/10/22 0000  Diet - low sodium heart healthy        06/10/22 0959           Cardiac diet DISCHARGE MEDICATION: Allergies as of 06/10/2022  Reactions   Sulfamethoxazole-trimethoprim    Other reaction(s): Unknown   Tape Other (See Comments)   PATIENT IS TAKING COUMADIN; HIS SKIN TEARS & BRUISES EASILY; PLEASE USE COBAN WRAP OR AN ALTERNATIVE TO MEDICAL TAPE!!   Lasix [furosemide] Rash   Penicillins Rash   Has patient had a PCN reaction causing immediate rash, facial/tongue/throat swelling, SOB or  lightheadedness with hypotension: Yes Has patient had a PCN reaction causing severe rash involving mucus membranes or skin necrosis: Unknown Has patient had a PCN reaction that required hospitalization: Unknown Has patient had a PCN reaction occurring within the last 10 years: unknown If all of the above answers are "NO", then may proceed with Cephalosporin use.   Sulfa Drugs Cross Reactors Rash        Medication List     TAKE these medications    acetaminophen 650 MG CR tablet Commonly known as: TYLENOL Take 650 mg by mouth daily as needed (knee pain).   amLODipine 10 MG tablet Commonly known as: NORVASC Take 1 tablet (10 mg total) by mouth daily. Start taking on: June 11, 2022   cefadroxil 500 MG capsule Commonly known as: DURICEF Take 1 capsule (500 mg total) by mouth 2 (two) times daily for 3 days.   DULoxetine 30 MG capsule Commonly known as: CYMBALTA Take 30 mg by mouth daily.   Eliquis 5 MG Tabs tablet Generic drug: apixaban TAKE ONE TABLET BY MOUTH TWICE A DAY What changed: how much to take   metoprolol tartrate 25 MG tablet Commonly known as: LOPRESSOR TAKE 1 AND 1/2 TABLET BY MOUTH TWO TIMES A DAY What changed: See the new instructions.   montelukast 10 MG tablet Commonly known as: SINGULAIR TAKE ONE TABLET BY MOUTH DAILY AFTER BREAKFAST What changed: See the new instructions.   nitroGLYCERIN 0.4 MG SL tablet Commonly known as: NITROSTAT PLACE 1 TABLET (0.4 MG TOTAL) UNDER THE TONGUE EVERY 5 (FIVE) MINUTESAS NEEDED FOR CHEST PAIN (UP TO 3 DOSES). What changed:  how much to take how to take this when to take this reasons to take this   pantoprazole 40 MG tablet Commonly known as: PROTONIX TAKE 1 TABLET BY MOUTH DAILY   Phospha 250 Neutral 155-852-130 MG Tabs TAKE 1 TABLET BY MOUTH DAILY AFTER BREAKFAST What changed: See the new instructions.   polyethylene glycol 17 g packet Commonly known as: MIRALAX / GLYCOLAX Take 17 g by mouth  daily. Start taking on: June 11, 2022   rosuvastatin 20 MG tablet Commonly known as: CRESTOR TAKE 1 TABLET BY MOUTH DAILY   traMADol 50 MG tablet Commonly known as: ULTRAM Take 1 tablet (50 mg total) by mouth every 12 (twelve) hours as needed for up to 5 days for moderate pain.        Discharge Exam: Filed Weights   06/07/22 0457  Weight: 78.2 kg   General; NAD  Condition at discharge: stable  The results of significant diagnostics from this hospitalization (including imaging, microbiology, ancillary and laboratory) are listed below for reference.   Imaging Studies: DG C-Arm 1-60 Min-No Report  Result Date: 06/04/2022 Fluoroscopy was utilized by the requesting physician.  No radiographic interpretation.   CT Abdomen Pelvis W Contrast  Result Date: 06/04/2022 CLINICAL DATA:  Acute abdominal pain. EXAM: CT ABDOMEN AND PELVIS WITH CONTRAST TECHNIQUE: Multidetector CT imaging of the abdomen and pelvis was performed using the standard protocol following bolus administration of intravenous contrast. RADIATION DOSE REDUCTION: This exam was performed according to the departmental dose-optimization program  which includes automated exposure control, adjustment of the mA and/or kV according to patient size and/or use of iterative reconstruction technique. CONTRAST:  43m ISOVUE-370 IOPAMIDOL (ISOVUE-370) INJECTION 76% COMPARISON:  None Available. FINDINGS: Lower Chest: No acute findings. Hepatobiliary: No hepatic masses identified. Gallbladder is unremarkable. No evidence of biliary ductal dilatation. Pancreas:  No mass or inflammatory changes. Spleen: Within normal limits in size and appearance. Adrenals/Urinary Tract: No suspicious masses identified. Multiple renal calculi are again seen bilaterally measuring up to 10 mm. A 7 mm calculus is seen in the proximal to mid left ureter, however there is no evidence of hydronephrosis. Unremarkable unopacified urinary bladder. Stomach/Bowel:  Moderate size hiatal hernia is seen. No evidence of obstruction, inflammatory process or abnormal fluid collections. Diverticulosis is seen mainly involving the sigmoid colon, however there is no evidence of diverticulitis. Vascular/Lymphatic: No pathologically enlarged lymph nodes. No acute vascular findings. Aortic atherosclerotic calcification incidentally noted. Reproductive:  Mildly enlarged prostate gland. Other:  None. Musculoskeletal:  No suspicious bone lesions identified. IMPRESSION: 7 mm calculus in proximal to mid left ureter, without evidence of hydronephrosis. Bilateral nephrolithiasis. Moderate hiatal hernia. Colonic diverticulosis, without radiographic evidence of diverticulitis. Mildly enlarged prostate. Aortic Atherosclerosis (ICD10-I70.0). Electronically Signed   By: JMarlaine HindM.D.   On: 06/04/2022 09:03   CT Head Wo Contrast  Result Date: 06/04/2022 CLINICAL DATA:  Mental status change with unknown cause EXAM: CT HEAD WITHOUT CONTRAST TECHNIQUE: Contiguous axial images were obtained from the base of the skull through the vertex without intravenous contrast. RADIATION DOSE REDUCTION: This exam was performed according to the departmental dose-optimization program which includes automated exposure control, adjustment of the mA and/or kV according to patient size and/or use of iterative reconstruction technique. COMPARISON:  08/15/2018 FINDINGS: Brain: Large remote left cerebellar and right cerebral infarcts. Generalized brain atrophy with ventriculomegaly. No acute infarct, hemorrhage, hydrocephalus, or collection. Vascular: No hyperdense vessel or unexpected calcification. Skull: Normal. Negative for fracture or focal lesion. Sinuses/Orbits: No acute finding. IMPRESSION: 1. No acute finding. 2. Extensive remote infarction and generalized brain atrophy. Electronically Signed   By: JJorje GuildM.D.   On: 06/04/2022 08:46   DG Chest 2 View  Result Date: 06/04/2022 CLINICAL DATA:   Weakness EXAM: CHEST - 2 VIEW COMPARISON:  05/24/2018 FINDINGS: Enlargement of cardiac silhouette post CABG and MVR. Atherosclerotic calcification aorta. Chronic pleural thickening or effusion RIGHT chest with scarring at minor fissure. No acute infiltrate, pleural effusion, or pneumothorax. Bones demineralized with changes of chronic RIGHT rotator cuff tear noted. IMPRESSION: Chronic RIGHT basilar scarring. Enlargement of cardiac silhouette post CABG and MVR. No acute abnormalities. Aortic Atherosclerosis (ICD10-I70.0). Electronically Signed   By: MLavonia DanaM.D.   On: 06/04/2022 08:37    Microbiology: Results for orders placed or performed during the hospital encounter of 06/04/22  Resp panel by RT-PCR (RSV, Flu A&B, Covid) Anterior Nasal Swab     Status: None   Collection Time: 06/04/22  5:35 AM   Specimen: Anterior Nasal Swab  Result Value Ref Range Status   SARS Coronavirus 2 by RT PCR NEGATIVE NEGATIVE Final    Comment: (NOTE) SARS-CoV-2 target nucleic acids are NOT DETECTED.  The SARS-CoV-2 RNA is generally detectable in upper respiratory specimens during the acute phase of infection. The lowest concentration of SARS-CoV-2 viral copies this assay can detect is 138 copies/mL. A negative result does not preclude SARS-Cov-2 infection and should not be used as the sole basis for treatment or other patient management decisions. A negative result  may occur with  improper specimen collection/handling, submission of specimen other than nasopharyngeal swab, presence of viral mutation(s) within the areas targeted by this assay, and inadequate number of viral copies(<138 copies/mL). A negative result must be combined with clinical observations, patient history, and epidemiological information. The expected result is Negative.  Fact Sheet for Patients:  EntrepreneurPulse.com.au  Fact Sheet for Healthcare Providers:  IncredibleEmployment.be  This test is  no t yet approved or cleared by the [place] FDA and  has been authorized for detection and/or diagnosis of SARS-CoV-2 by FDA under an Emergency Use Authorization (EUA). This EUA will remain  in effect (meaning this test can be used) for the duration of the COVID-19 declaration under Section 564(b)(1) of the Act, 21 U.S.C.section 360bbb-3(b)(1), unless the authorization is terminated  or revoked sooner.       Influenza A by PCR NEGATIVE NEGATIVE Final   Influenza B by PCR NEGATIVE NEGATIVE Final    Comment: (NOTE) The Xpert Xpress SARS-CoV-2/FLU/RSV plus assay is intended as an aid in the diagnosis of influenza from Nasopharyngeal swab specimens and should not be used as a sole basis for treatment. Nasal washings and aspirates are unacceptable for Xpert Xpress SARS-CoV-2/FLU/RSV testing.  Fact Sheet for Patients: EntrepreneurPulse.com.au  Fact Sheet for Healthcare Providers: IncredibleEmployment.be  This test is not yet approved or cleared by the [place] FDA and has been authorized for detection and/or diagnosis of SARS-CoV-2 by FDA under an Emergency Use Authorization (EUA). This EUA will remain in effect (meaning this test can be used) for the duration of the COVID-19 declaration under Section 564(b)(1) of the Act, 21 U.S.C. section 360bbb-3(b)(1), unless the authorization is terminated or revoked.     Resp Syncytial Virus by PCR NEGATIVE NEGATIVE Final    Comment: (NOTE) Fact Sheet for Patients: EntrepreneurPulse.com.au  Fact Sheet for Healthcare Providers: IncredibleEmployment.be  This test is not yet approved or cleared by the [place] FDA and has been authorized for detection and/or diagnosis of SARS-CoV-2 by FDA under an Emergency Use Authorization (EUA). This EUA will remain in effect (meaning this test can be used) for the duration of the COVID-19 declaration under Section  564(b)(1) of the Act, 21 U.S.C. section 360bbb-3(b)(1), unless the authorization is terminated or revoked.  Performed at [facility], [place] [address] [place], [place] [address]   Urine Culture     Status: Abnormal   Collection Time: 06/04/22  9:53 AM   Specimen: Urine, Clean Catch  Result Value Ref Range Status   Specimen Description URINE, CLEAN CATCH  Final   Special Requests   Final    NONE Performed at [facility], [place] [address], [place], [place] [address]    Culture >=100,000 COLONIES/mL ESCHERICHIA COLI (A)  Final   Report Status 06/07/2022 FINAL  Final   Organism ID, Bacteria ESCHERICHIA COLI (A)  Final      Susceptibility   Escherichia coli - MIC*    AMPICILLIN 8 SENSITIVE Sensitive     CEFAZOLIN <=4 SENSITIVE Sensitive     CEFEPIME <=0.12 SENSITIVE Sensitive     CEFTRIAXONE <=0.25 SENSITIVE Sensitive     CIPROFLOXACIN <=0.25 SENSITIVE Sensitive     GENTAMICIN <=1 SENSITIVE Sensitive     IMIPENEM <=0.25 SENSITIVE Sensitive     NITROFURANTOIN <=16 SENSITIVE Sensitive     TRIMETH/SULFA <=20 SENSITIVE Sensitive     AMPICILLIN/SULBACTAM <=2 SENSITIVE Sensitive     PIP/TAZO <=4 SENSITIVE Sensitive     * >=100,000 COLONIES/mL ESCHERICHIA  COLI    Labs: CBC: Recent Labs  Lab 06/04/22 0546 06/05/22 0155 06/06/22 0209 06/07/22 0229 06/08/22 0301  WBC 10.0 16.0* 12.1* 10.0 8.8  NEUTROABS 9.3*  --   --   --   --   HGB 13.9 12.6* 12.2* 13.3 12.4*  HCT 41.4 39.3 37.4* 42.4 38.8*  MCV 89.4 93.1 92.3 95.9 91.7  PLT 243 216 183 191 774   Basic Metabolic Panel: Recent Labs  Lab 06/06/22 0209 06/07/22 0229 06/08/22 0301 06/09/22 0233 06/10/22 0055  NA 138 138 139 137 136  K 4.0 4.1 3.8 3.4* 4.3  CL 106 111 110 104 104  CO2 22 18* 20* 23 20*  GLUCOSE 109* 88 92 105* 90  BUN '21 19 13 13 17  '$ CREATININE 1.55* 1.47* 1.30* 1.29* 1.31*  CALCIUM 8.6* 8.2* 8.3* 8.8* 8.8*  MG 1.7  --   --   --   --   PHOS 2.5  --   --   --   --    Liver Function  Tests: No results for input(s): "AST", "ALT", "ALKPHOS", "BILITOT", "PROT", "ALBUMIN" in the last 168 hours. CBG: No results for input(s): "GLUCAP" in the last 168 hours.  Discharge time spent: greater than 30 minutes.  Signed: Elmarie Shiley, MD [facility] 06/10/2022

## 2022-06-10 NOTE — Evaluation (Signed)
Occupational Therapy Evaluation Patient Details Name: Arthur [name] MRN: [id number] DOB: 21-Jul-1928 Today's Date: 06/10/2022   History of Present Illness 88 yo male with onset of severe back pain was admitted on 12/22 for management and had cystoscopy for stenting of L ureter after finding 7 mm calculus.  PMHx: CAD, CABG, CVA, L hemiparesis, PAF, HTN, HLD, maze procedure.   Clinical Impression   PTA, pt lives with spouse, typically able to pivot to/from a wheelchair with light assist and has caregiver assist with ADLs. Pt presents now with baseline deficits in L UE from prior CVA, as well as new deficits in strength, balance and endurance. Overall, pt requires Max A x 2 for bed mobility and standing in [place] for transfer to chair. Pt requires Max A for UB ADL and Total A x 2 for LB ADLs if completed in standing. Rec SNF rehab prior to DC home as pt below functional baseline w/ pt in agreement.      Recommendations for follow up therapy are one component of a multi-disciplinary discharge planning process, led by the attending physician.  Recommendations may be updated based on patient status, additional functional criteria and insurance authorization.   Follow Up Recommendations  Skilled nursing-short term rehab (<3 hours/day)     Assistance Recommended at Discharge Frequent or constant Supervision/Assistance  Patient can return home with the following Two people to help with walking and/or transfers;Two people to help with bathing/dressing/bathroom    Functional Status Assessment  Patient has had a recent decline in their functional status and demonstrates the ability to make significant improvements in function in a reasonable and predictable amount of time.  Equipment Recommendations  Other (comment) (TBD pending progress)    Recommendations for Other Services       Precautions / Restrictions Precautions Precautions: Fall Precaution Comments: has R knee brace and L  AFO Restrictions Weight Bearing Restrictions: No      Mobility Bed Mobility Overal bed mobility: Needs Assistance Bed Mobility: Supine to Sit     Supine to sit: Max assist, HOB elevated     General bed mobility comments: able to bring RLE to EOB, required assist to advance LLE and lift trunk with R UE on bedrail    Transfers Overall transfer level: Needs assistance Equipment used: Ambulation equipment used Transfers: Sit to/from Stand, Bed to chair/wheelchair/BSC Sit to Stand: Max assist, +2 physical assistance, From elevated surface, +2 safety/equipment           General transfer comment: Heavy assist to extend hips enough for placement of Stedy flaps, Difficulty achieving full posture Transfer via Lift Equipment: Stedy    Balance Overall balance assessment: Needs assistance, History of Falls Sitting-balance support: Single extremity supported, No upper extremity supported, Feet supported Sitting balance-Leahy Scale: Fair     Standing balance support: Single extremity supported, During functional activity Standing balance-Leahy Scale: Zero                             ADL either performed or assessed with clinical judgement   ADL Overall ADL's : Needs assistance/impaired Eating/Feeding: Set up;Sitting   Grooming: Minimal assistance;Sitting   Upper Body Bathing: Maximal assistance;Sitting   Lower Body Bathing: Total assistance;+2 for physical assistance;+2 for safety/equipment;Sit to/from stand   Upper Body Dressing : Maximal assistance;Sitting   Lower Body Dressing: Total assistance;+2 for physical assistance;+2 for safety/equipment;Sit to/from stand;Sitting/lateral leans       Toileting- Clothing Manipulation and Hygiene:  Total assistance;+2 for physical assistance;+2 for safety/equipment;Sit to/from stand;Sitting/lateral lean         General ADL Comments: +2 for any ADLs/standing tasks     Vision Baseline Vision/History: 1 Wears  glasses Ability to See in Adequate Light: 1 Impaired Patient Visual Report: No change from baseline Vision Assessment?: No apparent visual deficits     Perception     Praxis      Pertinent Vitals/Pain Pain Assessment Pain Assessment: Faces Faces Pain Scale: Hurts even more Pain Location: LUE (shoulder) with AA/PROM with flexion Pain Descriptors / Indicators: Guarding, Grimacing Pain Intervention(s): Monitored during session     Hand Dominance Right   Extremity/Trunk Assessment Upper Extremity Assessment Upper Extremity Assessment: LUE deficits/detail LUE Deficits / Details: hemiplegic from prior CVA. PROM impaired at all joints w/ pain noted during progressive stretching of shoulder and digits. rolled washcloth and placed in hand to prevent contracture progression - pt reports using something similar at home LUE Coordination: decreased gross motor;decreased fine motor   Lower Extremity Assessment Lower Extremity Assessment: Defer to PT evaluation   Cervical / Trunk Assessment Cervical / Trunk Assessment: Kyphotic   Communication Communication Communication: No difficulties   Cognition Arousal/Alertness: Awake/alert Behavior During Therapy: WFL for tasks assessed/performed Overall Cognitive Status: No family/caregiver present to determine baseline cognitive functioning                                 General Comments: Pt following instructions, responds well to humor, shows insight into deficits     General Comments  R knee brace donned on entry, donned L AFO for transfer to chair. Encouraged self ROM to L UE to prevent contracture progression    Exercises     Shoulder Instructions      Home Living Family/patient expects to be discharged to:: Skilled nursing facility                                 Additional Comments: for SNF rehab, lives at home with spouse and caregiver support      Prior Functioning/Environment Prior Level of  Function : Needs assist       Physical Assist : Mobility (physical) Mobility (physical): Transfers;Bed mobility   Mobility Comments: light +1 assist to assist for pivots to w/c ADLs Comments: assist with ADLs        OT Problem List: Decreased strength;Decreased range of motion;Decreased activity tolerance;Impaired balance (sitting and/or standing);Impaired UE functional use      OT Treatment/Interventions: Therapeutic exercise;Self-care/ADL training;Energy conservation;DME and/or AE instruction;Therapeutic activities;Patient/family education    OT Goals(Current goals can be found in the care plan section) Acute Rehab OT Goals Patient Stated Goal: go to rehab, improve strength and go home OT Goal Formulation: With patient Time For Goal Achievement: 06/24/22 Potential to Achieve Goals: Fair  OT Frequency: Min 2X/week    Co-evaluation              AM-PAC OT "6 Clicks" Daily Activity     Outcome Measure Help from another person eating meals?: A Little Help from another person taking care of personal grooming?: A Little Help from another person toileting, which includes using toliet, bedpan, or urinal?: Total Help from another person bathing (including washing, rinsing, drying)?: A Lot Help from another person to put on and taking off regular upper body clothing?: A Lot Help from another person  to put on and taking off regular lower body clothing?: Total 6 Click Score: 12   End of Session Equipment Utilized During Treatment: Gait belt Nurse Communication: Need for lift equipment;Mobility status  Activity Tolerance: Patient tolerated treatment well Patient left: in chair;with call bell/phone within reach;with chair alarm set  OT Visit Diagnosis: Unsteadiness on feet (R26.81);Other abnormalities of gait and mobility (R26.89);Muscle weakness (generalized) (M62.81)                Time: 1031-2811 OT Time Calculation (min): 31 min Charges:  OT General Charges $OT Visit: 1  Visit OT Evaluation $OT Eval Moderate Complexity: 1 Mod OT Treatments $Therapeutic Activity: 8-22 mins  Malachy Chamber, OTR/L Acute Rehab Services Office: [phone]   Arthur [name] 06/10/2022, 11:51 AM

## 2022-06-10 NOTE — Progress Notes (Signed)
Reported attempted x2, this nurse's name and number left with Network engineer at [employer] [facility] [employer] for return call from nurse for report. IV removed, family aware of transport.

## 2022-06-10 NOTE — TOC Transition Note (Signed)
Transition of Care [facility]) - CM/SW Discharge Note   Patient Details  Name: Arthur [name] MRN: [id number] Date of Birth: April 20, 1929  Transition of Care [name]) CM/SW Contact:  Coralee Pesa, [place] Phone Number: 06/10/2022, 11:59 AM   Clinical Narrative:    Pt to be transported to [name] via [place]. Nurse to call report to [phone] Rm # [id number]   Final next level of care: Skilled Nursing Facility Barriers to Discharge: Barriers Resolved   Patient Goals and CMS Choice CMS Medicare.gov Compare Post Acute Care list provided to:: Patient Choice offered to / list presented to : Patient, Spouse  Discharge Placement                Patient chooses bed at: [name] Patient to be transferred to facility by: [place] Name of family member notified: Spouse Patient and family notified of of transfer: 06/10/22  Discharge Plan and Services Additional resources added to the After Visit Summary for                                       Social Determinants of Health (SDOH) Interventions SDOH Screenings   Tobacco Use: Medium Risk (06/05/2022)     Readmission Risk Interventions     No data to display

## 2022-06-11 ENCOUNTER — Other Ambulatory Visit: Payer: Self-pay | Admitting: Urology

## 2022-06-11 DIAGNOSIS — N1832 Chronic kidney disease, stage 3b: Secondary | ICD-10-CM | POA: Diagnosis not present

## 2022-06-11 DIAGNOSIS — N202 Calculus of kidney with calculus of ureter: Secondary | ICD-10-CM | POA: Diagnosis not present

## 2022-06-11 DIAGNOSIS — B9620 Unspecified Escherichia coli [E. coli] as the cause of diseases classified elsewhere: Secondary | ICD-10-CM | POA: Diagnosis not present

## 2022-06-11 DIAGNOSIS — I480 Paroxysmal atrial fibrillation: Secondary | ICD-10-CM | POA: Diagnosis not present

## 2022-06-15 DIAGNOSIS — N202 Calculus of kidney with calculus of ureter: Secondary | ICD-10-CM | POA: Diagnosis not present

## 2022-06-15 DIAGNOSIS — N1832 Chronic kidney disease, stage 3b: Secondary | ICD-10-CM | POA: Diagnosis not present

## 2022-06-15 DIAGNOSIS — I129 Hypertensive chronic kidney disease with stage 1 through stage 4 chronic kidney disease, or unspecified chronic kidney disease: Secondary | ICD-10-CM | POA: Diagnosis not present

## 2022-06-15 DIAGNOSIS — I4819 Other persistent atrial fibrillation: Secondary | ICD-10-CM | POA: Diagnosis not present

## 2022-06-15 NOTE — Progress Notes (Signed)
For Anesthesia: PCP - Kristen Loader, FNP  Cardiologist - Martinique, Peter M, MD office note 05/31/22 in [facility]  Chest x-ray - 06/04/22 in [facility] EKG - 01/11/22 in [facility] Stress Test - greater than 2 years in [facility] ECHO - greater than 2 years in [facility] Cardiac Cath - greater than 2 years Pacemaker/ICD device last checked: Pacemaker orders received: Device Rep notified:  Spinal Cord Stimulator:  Sleep Study -  CPAP -   Fasting Blood Sugar -  Checks Blood Sugar _____ times a day Date and result of last Hgb A1c-  Last dose of GLP1 agonist-  GLP1 instructions:   Last dose of SGLT-2 inhibitors-  SGLT-2 instructions:  Blood Thinner Instructions: Eliquis Aspirin Instructions: Last Dose:  Activity level: nonambulatory   Can go up a flight of stairs and activities of daily living without stopping and without chest pain and/or shortness of breath   Able to exercise without chest pain and/or shortness of breath   Unable to go up a flight of stairs without chest pain and/or shortness of breath     Anesthesia review: CAD/CABG, PAF, Heart Murmur/MVP, Mitral Valve Repair, CVA, CKD  Patient denies shortness of breath, fever, cough and chest pain at PAT appointment   Patient verbalized understanding of instructions reviewed via telephone.

## 2022-06-15 NOTE — Patient Instructions (Addendum)
Preop instructions for:  Arthur [name]    Date of Birth:  [birth year]                     Date of Procedure:   06/22/2022 Procedure:  LEFT URETEROSCOPY/HOLMIUM LASER/STENT EXCHANGE     Surgeon: Dr. Terrilee Files Facility contact:  [facility]   Phone: [phone]                Health Care POA: RN contact name/phone#:                          and Fax #: [phone]   Transportation contact phone#:   Please send day of procedure:  Current med list  Medications taken the day of procedure (return attached form to hospital) confirm time of nothing by mouth status (return attached form to hospital) Patient Demographic info( to include DNR status, problem list, allergies) Bring Insurance card and picture ID    Time to arrive at [facility]: 0930 AM   Report to: Admitting (On your left hand side)    Do not eat solid food or drink past midnight the night before your procedure.(To include any tube feedings-must be discontinued)   Take these morning medications only with sips of water.(or give through gastrostomy or feeding tube). Amlodipine, Duloxetine, Metoprolol, Montelukast, Pantoprazole, Rosuvastatin   The Day Before Surgery:    Note: No Insulin or Diabetic meds should be given or taken the morning of the procedure!  Oral Hygiene is also important to reduce your risk of infection.                                    Remember - BRUSH YOUR TEETH THE MORNING OF SURGERY WITH YOUR REGULAR TOOTHPASTE   DENTURES WILL BE REMOVED PRIOR TO SURGERY PLEASE DO NOT APPLY "Poly grip" OR ADHESIVES!!!  Leave all jewelry and other valuables at place where living( no metal or rings to be worn) No contact lens  Men-no colognes,lotions   Any questions day of procedure,call  SHORT [phone]     Sent from :[facility] Presurgical Testing                   [place]                   [phone]   Sent by :               RN

## 2022-06-16 ENCOUNTER — Other Ambulatory Visit: Payer: Self-pay | Admitting: *Deleted

## 2022-06-16 NOTE — Patient Outreach (Signed)
Arthur [name] currently resides in [place] skilled nursing facility. Screening for potential [facility] care coordination services as benefit of insurance plan and Primary Care Provider.  PCP office [facility] has Upstream care management.   Update from [place], [place] indicates transition plan is to return home with spouse and caregiver. Currently max assistance with therapy.   Will continue to follow.    Marthenia Rolling, MSN, RN,BSN [place] Acute Care Coordinator [phone] (Direct dial)

## 2022-06-17 DIAGNOSIS — I129 Hypertensive chronic kidney disease with stage 1 through stage 4 chronic kidney disease, or unspecified chronic kidney disease: Secondary | ICD-10-CM | POA: Diagnosis not present

## 2022-06-17 DIAGNOSIS — I4819 Other persistent atrial fibrillation: Secondary | ICD-10-CM | POA: Diagnosis not present

## 2022-06-17 DIAGNOSIS — N1832 Chronic kidney disease, stage 3b: Secondary | ICD-10-CM | POA: Diagnosis not present

## 2022-06-17 DIAGNOSIS — I69354 Hemiplegia and hemiparesis following cerebral infarction affecting left non-dominant side: Secondary | ICD-10-CM | POA: Diagnosis not present

## 2022-06-17 DIAGNOSIS — N390 Urinary tract infection, site not specified: Secondary | ICD-10-CM | POA: Diagnosis not present

## 2022-06-17 DIAGNOSIS — B9620 Unspecified Escherichia coli [E. coli] as the cause of diseases classified elsewhere: Secondary | ICD-10-CM | POA: Diagnosis not present

## 2022-06-17 DIAGNOSIS — E785 Hyperlipidemia, unspecified: Secondary | ICD-10-CM | POA: Diagnosis not present

## 2022-06-17 DIAGNOSIS — R2681 Unsteadiness on feet: Secondary | ICD-10-CM | POA: Diagnosis not present

## 2022-06-17 DIAGNOSIS — M6281 Muscle weakness (generalized): Secondary | ICD-10-CM | POA: Diagnosis not present

## 2022-06-17 DIAGNOSIS — N202 Calculus of kidney with calculus of ureter: Secondary | ICD-10-CM | POA: Diagnosis not present

## 2022-06-17 DIAGNOSIS — I2510 Atherosclerotic heart disease of native coronary artery without angina pectoris: Secondary | ICD-10-CM | POA: Diagnosis not present

## 2022-06-21 ENCOUNTER — Encounter (HOSPITAL_COMMUNITY): Payer: Self-pay | Admitting: Urology

## 2022-06-21 ENCOUNTER — Ambulatory Visit (HOSPITAL_COMMUNITY): Payer: Medicare Other | Admitting: Physician Assistant

## 2022-06-21 DIAGNOSIS — I129 Hypertensive chronic kidney disease with stage 1 through stage 4 chronic kidney disease, or unspecified chronic kidney disease: Secondary | ICD-10-CM | POA: Diagnosis not present

## 2022-06-21 DIAGNOSIS — B9620 Unspecified Escherichia coli [E. coli] as the cause of diseases classified elsewhere: Secondary | ICD-10-CM | POA: Diagnosis not present

## 2022-06-21 DIAGNOSIS — N390 Urinary tract infection, site not specified: Secondary | ICD-10-CM | POA: Diagnosis not present

## 2022-06-21 DIAGNOSIS — R2681 Unsteadiness on feet: Secondary | ICD-10-CM | POA: Diagnosis not present

## 2022-06-21 DIAGNOSIS — N202 Calculus of kidney with calculus of ureter: Secondary | ICD-10-CM | POA: Diagnosis not present

## 2022-06-21 DIAGNOSIS — I69354 Hemiplegia and hemiparesis following cerebral infarction affecting left non-dominant side: Secondary | ICD-10-CM | POA: Diagnosis not present

## 2022-06-21 DIAGNOSIS — I2510 Atherosclerotic heart disease of native coronary artery without angina pectoris: Secondary | ICD-10-CM | POA: Diagnosis not present

## 2022-06-21 DIAGNOSIS — M6281 Muscle weakness (generalized): Secondary | ICD-10-CM | POA: Diagnosis not present

## 2022-06-21 DIAGNOSIS — E785 Hyperlipidemia, unspecified: Secondary | ICD-10-CM | POA: Diagnosis not present

## 2022-06-21 DIAGNOSIS — I480 Paroxysmal atrial fibrillation: Secondary | ICD-10-CM | POA: Diagnosis not present

## 2022-06-21 DIAGNOSIS — I4819 Other persistent atrial fibrillation: Secondary | ICD-10-CM | POA: Diagnosis not present

## 2022-06-21 DIAGNOSIS — N1832 Chronic kidney disease, stage 3b: Secondary | ICD-10-CM | POA: Diagnosis not present

## 2022-06-21 NOTE — Progress Notes (Signed)
Spoke with Arthur [name] at [facility] and she stated that patient had his last dose of Eliquis 06-21-22 at 8:30 am.

## 2022-06-21 NOTE — Progress Notes (Addendum)
Preop instructions for:     Astor Gentle Date of Birth:     12/27/2028                 Date of Procedure:   06-22-22 Procedure:     L ureteroscopy with stent exchange Surgeon: Dr. Cain Sieve Facility contact:  [place]   Phone:   [phone]              Health Care POA: RN contact name/phone#:                          and Fax #: [phone]   Transportation contact phone#: Dalphine Handing Transportation [phone]  Please send day of procedure:  Current med list  Medications taken the day of procedure (return attached form to hospital) confirm time of nothing by mouth status (return attached form to hospital) Patient Demographic info( to include DNR status, problem list, allergies) Bring Insurance card and picture ID    Time to arrive at [facility]: 9:30 AM   Report to: Admitting (On your left hand side)    Do not eat solid food or drink liquids past midnight the night before your procedure.(To include any tube feedings-must be discontinued)    Take these morning medications only with sips of water.(or give through gastrostomy or feeding tube).  Duloxetine Amlodipine Montelukast Pantoprazole Rosuvastatin Metoprolol Tylenol if needed   Note: No Insulin or Diabetic meds should be given or taken the morning of the procedure!  Oral Hygiene is also important to reduce your risk of infection.                                    Remember - BRUSH YOUR TEETH THE MORNING OF SURGERY WITH YOUR REGULAR TOOTHPASTE   DENTURES WILL BE REMOVED PRIOR TO SURGERY PLEASE DO NOT APPLY "Poly grip" OR ADHESIVES!!!  Leave all jewelry and other valuables at place where living( no metal or rings to be worn) No contact lens Women-no make-up, no lotions,perfumes,powders Men-no colognes,lotions   Any questions day of procedure,call  SHORT STAY-[phone]     Sent from :[facility] Presurgical Testing                   [place]                   Fax:[phone]   Sent by :  Norvel Richards, RN

## 2022-06-22 ENCOUNTER — Encounter (HOSPITAL_COMMUNITY): Payer: Self-pay | Admitting: Urology

## 2022-06-22 ENCOUNTER — Encounter (HOSPITAL_COMMUNITY)
Admission: RE | Disposition: A | Discharge status: Skilled Nursing Facility | Payer: Self-pay | Source: Home / Self Care | Attending: Urology

## 2022-06-22 ENCOUNTER — Ambulatory Visit (HOSPITAL_COMMUNITY)
Admission: RE | Admit: 2022-06-22 | Discharge: 2022-06-22 | Disposition: A | Discharge status: Skilled Nursing Facility | Payer: Medicare Other | Attending: Urology | Admitting: Urology

## 2022-06-22 ENCOUNTER — Other Ambulatory Visit: Payer: Self-pay

## 2022-06-22 DIAGNOSIS — Z01818 Encounter for other preprocedural examination: Secondary | ICD-10-CM | POA: Diagnosis not present

## 2022-06-22 DIAGNOSIS — Z538 Procedure and treatment not carried out for other reasons: Secondary | ICD-10-CM | POA: Insufficient documentation

## 2022-06-22 DIAGNOSIS — Z7901 Long term (current) use of anticoagulants: Secondary | ICD-10-CM | POA: Diagnosis not present

## 2022-06-22 HISTORY — DX: Dysphagia, unspecified: R13.10

## 2022-06-22 HISTORY — DX: Unspecified atrial fibrillation: I48.91

## 2022-06-22 HISTORY — DX: Personal history of urinary calculi: Z87.442

## 2022-06-22 SURGERY — CYSTOSCOPY/URETEROSCOPY/HOLMIUM LASER/STENT PLACEMENT
Anesthesia: General

## 2022-06-22 MED ORDER — CIPROFLOXACIN IN D5W 400 MG/200ML IV SOLN
400.0000 mg | INTRAVENOUS | Status: DC
  Filled 2022-06-22: qty 200

## 2022-06-22 MED ORDER — ONDANSETRON HCL 4 MG/2ML IJ SOLN
INTRAMUSCULAR | Status: AC
  Filled 2022-06-22: qty 2

## 2022-06-22 MED ORDER — METOPROLOL TARTRATE 25 MG PO TABS
25.0000 mg | ORAL_TABLET | ORAL | Status: AC
  Administered 2022-06-22: 25 mg via ORAL
  Filled 2022-06-22: qty 1

## 2022-06-22 MED ORDER — LIDOCAINE HCL (PF) 2 % IJ SOLN
INTRAMUSCULAR | Status: AC
  Filled 2022-06-22: qty 5

## 2022-06-22 MED ORDER — PROPOFOL 10 MG/ML IV BOLUS
INTRAVENOUS | Status: AC
  Filled 2022-06-22: qty 20

## 2022-06-22 MED ORDER — DEXAMETHASONE SODIUM PHOSPHATE 10 MG/ML IJ SOLN
INTRAMUSCULAR | Status: AC
  Filled 2022-06-22: qty 1

## 2022-06-22 MED ORDER — FENTANYL CITRATE (PF) 100 MCG/2ML IJ SOLN
INTRAMUSCULAR | Status: AC
  Filled 2022-06-22: qty 2

## 2022-06-22 MED ORDER — ACETAMINOPHEN 500 MG PO TABS: 1000.0000 mg | ORAL_TABLET | Freq: Once | ORAL | Status: DC

## 2022-06-22 SURGICAL SUPPLY — 25 items
BAG COUNTER SPONGE SURGICOUNT (BAG) IMPLANT
BAG SPNG CNTER NS LX DISP (BAG)
BAG URO CATCHER STRL LF (MISCELLANEOUS) ×2 IMPLANT
BASKET ZERO TIP NITINOL 2.4FR (BASKET) IMPLANT
BSKT STON RTRVL ZERO TP 2.4FR (BASKET)
CATH URETERAL DUAL LUMEN 10F (MISCELLANEOUS) ×2 IMPLANT
CATH URETL OPEN END 6FR 70 (CATHETERS) IMPLANT
CLOTH BEACON ORANGE TIMEOUT ST (SAFETY) ×2 IMPLANT
GLOVE SS BIOGEL STRL SZ 7 (GLOVE) ×2 IMPLANT
GLOVE SURG LX STRL 7.5 STRW (GLOVE) ×1 IMPLANT
GOWN STRL REUS W/ TWL XL LVL3 (GOWN DISPOSABLE) ×2 IMPLANT
GOWN STRL REUS W/TWL XL LVL3 (GOWN DISPOSABLE) ×1
GUIDEWIRE STR DUAL SENSOR (WIRE) ×2 IMPLANT
GUIDEWIRE ZIPWRE .038 STRAIGHT (WIRE) ×2 IMPLANT
IV NS 1000ML (IV SOLUTION) ×1
IV NS 1000ML BAXH (IV SOLUTION) ×1 IMPLANT
KIT TURNOVER KIT A (KITS) IMPLANT
LASER FIB FLEXIVA PULSE ID 365 (Laser) IMPLANT
MANIFOLD NEPTUNE II (INSTRUMENTS) ×2 IMPLANT
PACK CYSTO (CUSTOM PROCEDURE TRAY) ×2 IMPLANT
SHEATH NAVIGATOR HD 11/13X36 (SHEATH) IMPLANT
TRACTIP FLEXIVA PULS ID 200XHI (Laser) IMPLANT
TRACTIP FLEXIVA PULSE ID 200 (Laser)
TUBING CONNECTING 10 (TUBING) ×1 IMPLANT
TUBING UROLOGY SET (TUBING) ×2 IMPLANT

## 2022-06-22 NOTE — Progress Notes (Signed)
Arthur Creque' eliquis did not get held as recommended. Additionally, his urine grew out pseudomonas and he was started on antibiotics yesterday evening. I had a long discussion with Arthur [name]' wife. Each of these things increases the risk of complications with his procedure today. Subsequently, his wife felt more comfortable postponing the procedure, which I feel is prudent. Will cancel the procedure today and reschedule to the near future.

## 2022-06-22 NOTE — Anesthesia Preprocedure Evaluation (Addendum)
Anesthesia Evaluation  Patient identified by MRN, date of birth, ID band Patient awake    Reviewed: Allergy & Precautions, NPO status , Patient's Chart, lab work & pertinent test results, reviewed documented beta blocker date and time   Airway Mallampati: II  TM Distance: >3 FB Neck ROM: Full    Dental  (+) Dental Advisory Given, Partial Upper, Partial Lower   Pulmonary former smoker   Pulmonary exam normal breath sounds clear to auscultation       Cardiovascular hypertension, Pt. on home beta blockers and Pt. on medications pulmonary hypertension+ CAD, + Cardiac Stents and + CABG  Normal cardiovascular exam+ dysrhythmias Atrial Fibrillation + Valvular Problems/Murmurs (s/p mitral valve repair and MAZE 2005)  Rhythm:Regular Rate:Normal  TTE 2019 - Left ventricle: The cavity size was normal. Systolic function was    normal. The estimated ejection fraction was in the range of 55%    to 60%. Wall motion was normal; there were no regional wall    motion abnormalities. Features are consistent with a pseudonormal    left ventricular filling pattern, with concomitant abnormal    relaxation and increased filling pressure (grade 2 diastolic    dysfunction).  - Aortic valve: Transvalvular velocity was within the normal range.    There was no stenosis. There was no regurgitation.  - Aorta: Ascending aortic diameter: 38 mm (S).  - Ascending aorta: The ascending aorta was mildly dilated.  - Mitral valve: Prior procedures included surgical repair.    Transvalvular velocity was within the normal range. There was no    evidence for stenosis. There was mild regurgitation. Valve area    by pressure half-time: 2.29 cm^2.  - Left atrium: The atrium was moderately dilated.  - Right ventricle: The cavity size was normal. Wall thickness was    normal. Systolic function was normal.  - Atrial septum: No defect or patent foramen ovale was identified.  -  Tricuspid valve: There was mild regurgitation.  - Pulmonary arteries: Systolic pressure was severely increased. PA    peak pressure: 56 mm Hg (S).   Cath 2016 1. Severe 3 vessel obstructive CAD 2. Patent LIMA to the LAD 3. Patent SVG to diagonal 4. Patent SVG to OM 3 5. Occluded SVG to PDA. 6. Successful stenting of the proximal RCA with a DES.     Neuro/Psych CVA, No Residual Symptoms  negative psych ROS   GI/Hepatic Neg liver ROS, hiatal hernia,GERD  ,,  Endo/Other  negative endocrine ROS    Renal/GU Renal InsufficiencyRenal disease  negative genitourinary   Musculoskeletal  (+) Arthritis ,    Abdominal   Peds  Hematology  (+) Blood dyscrasia (eliquis)   Anesthesia Other Findings   Reproductive/Obstetrics                             Anesthesia Physical Anesthesia Plan  ASA: 4  Anesthesia Plan: General   Post-op Pain Management: Minimal or no pain anticipated   Induction: Intravenous  PONV Risk Score and Plan: Ondansetron, Dexamethasone and Treatment may vary due to age or medical condition  Airway Management Planned: LMA  Additional Equipment:   Intra-op Plan:   Post-operative Plan: Extubation in OR  Informed Consent: I have reviewed the patients History and Physical, chart, labs and discussed the procedure including the risks, benefits and alternatives for the proposed anesthesia with the patient or authorized representative who has indicated his/her understanding and acceptance.  Dental advisory given  Plan Discussed with: CRNA  Anesthesia Plan Comments:        Anesthesia Quick Evaluation

## 2022-06-22 NOTE — Progress Notes (Signed)
RN spoke with Lenward Chancellor, RN from [facility] who states patient received Eliquis yesterday.  Dr. Cain Sieve notified, ok to proceed with surgery.

## 2022-06-22 NOTE — Progress Notes (Signed)
Patient states multiple times he ate grits and drank orange juice this morning at 0700, however a few minutes later patient states "maybe that was yesterday".  RN spoke with Lenward Chancellor, RN from [facility] who states patient did not eat or drink anything this morning. Dr. Lanetta Inch notified with no orders given.

## 2022-06-24 DIAGNOSIS — R2681 Unsteadiness on feet: Secondary | ICD-10-CM | POA: Diagnosis not present

## 2022-06-24 DIAGNOSIS — E785 Hyperlipidemia, unspecified: Secondary | ICD-10-CM | POA: Diagnosis not present

## 2022-06-24 DIAGNOSIS — B9620 Unspecified Escherichia coli [E. coli] as the cause of diseases classified elsewhere: Secondary | ICD-10-CM | POA: Diagnosis not present

## 2022-06-24 DIAGNOSIS — N390 Urinary tract infection, site not specified: Secondary | ICD-10-CM | POA: Diagnosis not present

## 2022-06-24 DIAGNOSIS — N1832 Chronic kidney disease, stage 3b: Secondary | ICD-10-CM | POA: Diagnosis not present

## 2022-06-24 DIAGNOSIS — I2510 Atherosclerotic heart disease of native coronary artery without angina pectoris: Secondary | ICD-10-CM | POA: Diagnosis not present

## 2022-06-24 DIAGNOSIS — I129 Hypertensive chronic kidney disease with stage 1 through stage 4 chronic kidney disease, or unspecified chronic kidney disease: Secondary | ICD-10-CM | POA: Diagnosis not present

## 2022-06-24 DIAGNOSIS — M6281 Muscle weakness (generalized): Secondary | ICD-10-CM | POA: Diagnosis not present

## 2022-06-24 DIAGNOSIS — N202 Calculus of kidney with calculus of ureter: Secondary | ICD-10-CM | POA: Diagnosis not present

## 2022-06-24 DIAGNOSIS — I69354 Hemiplegia and hemiparesis following cerebral infarction affecting left non-dominant side: Secondary | ICD-10-CM | POA: Diagnosis not present

## 2022-06-24 DIAGNOSIS — I4819 Other persistent atrial fibrillation: Secondary | ICD-10-CM | POA: Diagnosis not present

## 2022-06-28 DIAGNOSIS — N390 Urinary tract infection, site not specified: Secondary | ICD-10-CM | POA: Diagnosis not present

## 2022-06-28 DIAGNOSIS — B9620 Unspecified Escherichia coli [E. coli] as the cause of diseases classified elsewhere: Secondary | ICD-10-CM | POA: Diagnosis not present

## 2022-06-28 DIAGNOSIS — I2510 Atherosclerotic heart disease of native coronary artery without angina pectoris: Secondary | ICD-10-CM | POA: Diagnosis not present

## 2022-06-28 DIAGNOSIS — I4819 Other persistent atrial fibrillation: Secondary | ICD-10-CM | POA: Diagnosis not present

## 2022-06-28 DIAGNOSIS — N202 Calculus of kidney with calculus of ureter: Secondary | ICD-10-CM | POA: Diagnosis not present

## 2022-06-28 DIAGNOSIS — R2681 Unsteadiness on feet: Secondary | ICD-10-CM | POA: Diagnosis not present

## 2022-06-28 DIAGNOSIS — M6281 Muscle weakness (generalized): Secondary | ICD-10-CM | POA: Diagnosis not present

## 2022-06-28 DIAGNOSIS — N1832 Chronic kidney disease, stage 3b: Secondary | ICD-10-CM | POA: Diagnosis not present

## 2022-06-28 DIAGNOSIS — I69354 Hemiplegia and hemiparesis following cerebral infarction affecting left non-dominant side: Secondary | ICD-10-CM | POA: Diagnosis not present

## 2022-06-28 DIAGNOSIS — E785 Hyperlipidemia, unspecified: Secondary | ICD-10-CM | POA: Diagnosis not present

## 2022-06-28 DIAGNOSIS — I129 Hypertensive chronic kidney disease with stage 1 through stage 4 chronic kidney disease, or unspecified chronic kidney disease: Secondary | ICD-10-CM | POA: Diagnosis not present

## 2022-06-30 ENCOUNTER — Other Ambulatory Visit: Payer: Self-pay | Admitting: Urology

## 2022-07-01 DIAGNOSIS — I129 Hypertensive chronic kidney disease with stage 1 through stage 4 chronic kidney disease, or unspecified chronic kidney disease: Secondary | ICD-10-CM | POA: Diagnosis not present

## 2022-07-01 DIAGNOSIS — I69354 Hemiplegia and hemiparesis following cerebral infarction affecting left non-dominant side: Secondary | ICD-10-CM | POA: Diagnosis not present

## 2022-07-01 DIAGNOSIS — R2681 Unsteadiness on feet: Secondary | ICD-10-CM | POA: Diagnosis not present

## 2022-07-01 DIAGNOSIS — M6281 Muscle weakness (generalized): Secondary | ICD-10-CM | POA: Diagnosis not present

## 2022-07-01 DIAGNOSIS — N1832 Chronic kidney disease, stage 3b: Secondary | ICD-10-CM | POA: Diagnosis not present

## 2022-07-01 DIAGNOSIS — N390 Urinary tract infection, site not specified: Secondary | ICD-10-CM | POA: Diagnosis not present

## 2022-07-01 DIAGNOSIS — B9620 Unspecified Escherichia coli [E. coli] as the cause of diseases classified elsewhere: Secondary | ICD-10-CM | POA: Diagnosis not present

## 2022-07-01 DIAGNOSIS — N202 Calculus of kidney with calculus of ureter: Secondary | ICD-10-CM | POA: Diagnosis not present

## 2022-07-01 DIAGNOSIS — I2510 Atherosclerotic heart disease of native coronary artery without angina pectoris: Secondary | ICD-10-CM | POA: Diagnosis not present

## 2022-07-01 DIAGNOSIS — E785 Hyperlipidemia, unspecified: Secondary | ICD-10-CM | POA: Diagnosis not present

## 2022-07-01 DIAGNOSIS — I4819 Other persistent atrial fibrillation: Secondary | ICD-10-CM | POA: Diagnosis not present

## 2022-07-05 DIAGNOSIS — N1832 Chronic kidney disease, stage 3b: Secondary | ICD-10-CM | POA: Diagnosis not present

## 2022-07-05 DIAGNOSIS — N202 Calculus of kidney with calculus of ureter: Secondary | ICD-10-CM | POA: Diagnosis not present

## 2022-07-05 DIAGNOSIS — I10 Essential (primary) hypertension: Secondary | ICD-10-CM | POA: Diagnosis not present

## 2022-07-05 DIAGNOSIS — I480 Paroxysmal atrial fibrillation: Secondary | ICD-10-CM | POA: Diagnosis not present

## 2022-07-06 DIAGNOSIS — B9620 Unspecified Escherichia coli [E. coli] as the cause of diseases classified elsewhere: Secondary | ICD-10-CM | POA: Diagnosis not present

## 2022-07-06 DIAGNOSIS — I69354 Hemiplegia and hemiparesis following cerebral infarction affecting left non-dominant side: Secondary | ICD-10-CM | POA: Diagnosis not present

## 2022-07-06 DIAGNOSIS — M6281 Muscle weakness (generalized): Secondary | ICD-10-CM | POA: Diagnosis not present

## 2022-07-06 DIAGNOSIS — N202 Calculus of kidney with calculus of ureter: Secondary | ICD-10-CM | POA: Diagnosis not present

## 2022-07-06 DIAGNOSIS — I4819 Other persistent atrial fibrillation: Secondary | ICD-10-CM | POA: Diagnosis not present

## 2022-07-06 DIAGNOSIS — E785 Hyperlipidemia, unspecified: Secondary | ICD-10-CM | POA: Diagnosis not present

## 2022-07-06 DIAGNOSIS — N1832 Chronic kidney disease, stage 3b: Secondary | ICD-10-CM | POA: Diagnosis not present

## 2022-07-06 DIAGNOSIS — I2510 Atherosclerotic heart disease of native coronary artery without angina pectoris: Secondary | ICD-10-CM | POA: Diagnosis not present

## 2022-07-06 DIAGNOSIS — N390 Urinary tract infection, site not specified: Secondary | ICD-10-CM | POA: Diagnosis not present

## 2022-07-06 DIAGNOSIS — I129 Hypertensive chronic kidney disease with stage 1 through stage 4 chronic kidney disease, or unspecified chronic kidney disease: Secondary | ICD-10-CM | POA: Diagnosis not present

## 2022-07-06 DIAGNOSIS — R2681 Unsteadiness on feet: Secondary | ICD-10-CM | POA: Diagnosis not present

## 2022-07-07 NOTE — Progress Notes (Signed)
COVID Vaccine Completed:  Date of COVID positive in last 90 days:  PCP - Jillyn Ledger, FNP Cardiologist - Peter Martinique, MD  Chest x-ray - 06/04/22 Epic EKG - 01/11/22 Epic Stress Test - 01/03/15 Epic ECHO - 07/12/17 Epic Cardiac Cath - 01/24/15 Epic Pacemaker/ICD device last checked: Spinal Cord Stimulator:  Bowel Prep -   Sleep Study -  CPAP -   Fasting Blood Sugar -  Checks Blood Sugar _____ times a day  Last dose of GLP1 agonist-  N/A GLP1 instructions:  N/A   Last dose of SGLT-2 inhibitors-  N/A SGLT-2 instructions: N/A   Blood Thinner Instructions: Eliquis  Aspirin Instructions: Last Dose:  Activity level:  Can go up a flight of stairs and perform activities of daily living without stopping and without symptoms of chest pain or shortness of breath.  Able to exercise without symptoms  Unable to go up a flight of stairs without symptoms of     Anesthesia review: CAD, a fib, CKD, CVA,  heart murmur, MVP  Patient denies shortness of breath, fever, cough and chest pain at PAT appointment  Patient verbalized understanding of instructions that were given to them at the PAT appointment. Patient was also instructed that they will need to review over the PAT instructions again at home before surgery.

## 2022-07-07 NOTE — Patient Instructions (Signed)
SURGICAL WAITING ROOM VISITATION  Patients having surgery or a procedure may have no more than 2 support people in the waiting area - these visitors may rotate.    Children under the age of 62 must have an adult with them who is not the patient.  Due to an increase in RSV and influenza rates and associated hospitalizations, children ages 61 and under may not visit patients in [place].  If the patient needs to stay at the hospital during part of their recovery, the visitor guidelines for inpatient rooms apply. Pre-op nurse will coordinate an appropriate time for 1 support person to accompany patient in pre-op.  This support person may not rotate.    Please refer to the [facility] website for the visitor guidelines for Inpatients (after your surgery is over and you are in a regular room).       Your procedure is scheduled on:    Report to [facility] Main Entrance    Report to admitting at AM   Call this number if you have problems the morning of surgery [phone]   Do not eat food :After Midnight.   After Midnight you may have the following liquids until ______ AM/ PM DAY OF SURGERY  Water Non-Citrus Juices (without pulp, NO RED-Apple, White grape, White cranberry) Black Coffee (NO MILK/CREAM OR CREAMERS, sugar ok)  Clear Tea (NO MILK/CREAM OR CREAMERS, sugar ok) regular and decaf                             Plain Jell-O (NO RED)                                           Fruit ices (not with fruit pulp, NO RED)                                     Popsicles (NO RED)                                                               Sports drinks like Gatorade (NO RED)              Drink 2 Ensure/G2 drinks AT 10:00 PM the night before surgery.        The day of surgery:  Drink ONE (1) Pre-Surgery Clear Ensure or G2 at AM the morning of surgery. Drink in one sitting. Do not sip.  This drink was given to you during your hospital  pre-op appointment  visit. Nothing else to drink after completing the  Pre-Surgery Clear Ensure or G2.          If you have questions, please contact your surgeon's office.   FOLLOW BOWEL PREP AND ANY ADDITIONAL PRE OP INSTRUCTIONS YOU RECEIVED FROM YOUR SURGEON'S OFFICE!!!     Oral Hygiene is also important to reduce your risk of infection.  Remember - BRUSH YOUR TEETH THE MORNING OF SURGERY WITH YOUR REGULAR TOOTHPASTE  DENTURES WILL BE REMOVED PRIOR TO SURGERY PLEASE DO NOT APPLY "Poly grip" OR ADHESIVES!!!   Do NOT smoke after Midnight   Take these medicines the morning of surgery with A SIP OF WATER:   These are anesthesia recommendations for holding your anticoagulants.  Please contact your prescribing physician to confirm IF it is safe to hold your anticoagulants for this length of time.   Eliquis Apixaban   72 hours   Xarelto Rivaroxaban   72 hours  Plavix Clopidogrel   120 hours  Pletal Cilostazol   120 hours    DO NOT TAKE ANY ORAL DIABETIC MEDICATIONS DAY OF YOUR SURGERY  Bring CPAP mask and tubing day of surgery.                              You may not have any metal on your body including hair pins, jewelry, and body piercing             Do not wear make-up, lotions, powders, perfumes/cologne, or deodorant  Do not wear nail polish including gel and S&S, artificial/acrylic nails, or any other type of covering on natural nails including finger and toenails. If you have artificial nails, gel coating, etc. that needs to be removed by a nail salon please have this removed prior to surgery or surgery may need to be canceled/ delayed if the surgeon/ anesthesia feels like they are unable to be safely monitored.   Do not shave  48 hours prior to surgery.               Men may shave face and neck.   Do not bring valuables to the hospital. [place].   Contacts, glasses, dentures or bridgework may not be  worn into surgery.   Bring small overnight bag day of surgery.   DO NOT [place]. PHARMACY WILL DISPENSE MEDICATIONS LISTED ON YOUR MEDICATION LIST TO YOU DURING YOUR ADMISSION [place]!    Patients discharged on the day of surgery will not be allowed to drive home.  Someone NEEDS to stay with you for the first 24 hours after anesthesia.   Special Instructions: Bring a copy of your healthcare power of attorney and living will documents the day of surgery if you haven't scanned them before.              Please read over the following fact sheets you were given: IF [place] [phone]   If you received a COVID test during your pre-op visit  it is requested that you wear a mask when out in public, stay away from anyone that may not be feeling well and notify your surgeon if you develop symptoms. If you test positive for Covid or have been in contact with anyone that has tested positive in the last 10 days please notify you surgeon.    [place] - Preparing for Surgery Before surgery, you can play an important role.  Because skin is not sterile, your skin needs to be as free of germs as possible.  You can reduce the number of germs on your skin by washing with CHG (chlorahexidine gluconate) soap before surgery.  CHG is  an antiseptic cleaner which kills germs and bonds with the skin to continue killing germs even after washing. Please DO NOT use if you have an allergy to CHG or antibacterial soaps.  If your skin becomes reddened/irritated stop using the CHG and inform your nurse when you arrive at Short Stay. Do not shave (including legs and underarms) for at least 48 hours prior to the first CHG shower.  You may shave your face/neck.  Please follow these instructions carefully:  1.  Shower with CHG Soap the night before surgery and the  morning of surgery.  2.  If you choose to wash your hair, wash  your hair first as usual with your normal  shampoo.  3.  After you shampoo, rinse your hair and body thoroughly to remove the shampoo.                             4.  Use CHG as you would any other liquid soap.  You can apply chg directly to the skin and wash.  Gently with a scrungie or clean washcloth.  5.  Apply the CHG Soap to your body ONLY FROM THE NECK DOWN.   Do   not use on face/ open                           Wound or open sores. Avoid contact with eyes, ears mouth and   genitals (private parts).                       Wash face,  Genitals (private parts) with your normal soap.             6.  Wash thoroughly, paying special attention to the area where your    surgery  will be performed.  7.  Thoroughly rinse your body with warm water from the neck down.  8.  DO NOT shower/wash with your normal soap after using and rinsing off the CHG Soap.                9.  Pat yourself dry with a clean towel.            10.  Wear clean pajamas.            11.  Place clean sheets on your bed the night of your first shower and do not  sleep with pets. Day of Surgery : Do not apply any lotions/deodorants the morning of surgery.  Please wear clean clothes to the hospital/surgery center.  FAILURE TO FOLLOW THESE INSTRUCTIONS MAY RESULT IN THE CANCELLATION OF YOUR SURGERY  PATIENT SIGNATURE_________________________________  NURSE SIGNATURE__________________________________  ________________________________________________________________________

## 2022-07-08 ENCOUNTER — Other Ambulatory Visit: Payer: Self-pay | Admitting: *Deleted

## 2022-07-08 ENCOUNTER — Encounter (HOSPITAL_COMMUNITY)
Admission: RE | Admit: 2022-07-08 | Discharge: 2022-07-08 | Disposition: A | Discharge status: Home / Self Care | Payer: Medicare Other | Source: Ambulatory Visit | Attending: Anesthesiology | Admitting: Anesthesiology

## 2022-07-08 DIAGNOSIS — I2510 Atherosclerotic heart disease of native coronary artery without angina pectoris: Secondary | ICD-10-CM | POA: Diagnosis not present

## 2022-07-08 DIAGNOSIS — M6281 Muscle weakness (generalized): Secondary | ICD-10-CM | POA: Diagnosis not present

## 2022-07-08 DIAGNOSIS — R2681 Unsteadiness on feet: Secondary | ICD-10-CM | POA: Diagnosis not present

## 2022-07-08 DIAGNOSIS — I4819 Other persistent atrial fibrillation: Secondary | ICD-10-CM | POA: Diagnosis not present

## 2022-07-08 DIAGNOSIS — E785 Hyperlipidemia, unspecified: Secondary | ICD-10-CM | POA: Diagnosis not present

## 2022-07-08 DIAGNOSIS — I69354 Hemiplegia and hemiparesis following cerebral infarction affecting left non-dominant side: Secondary | ICD-10-CM | POA: Diagnosis not present

## 2022-07-08 DIAGNOSIS — N202 Calculus of kidney with calculus of ureter: Secondary | ICD-10-CM | POA: Diagnosis not present

## 2022-07-08 DIAGNOSIS — N390 Urinary tract infection, site not specified: Secondary | ICD-10-CM | POA: Diagnosis not present

## 2022-07-08 DIAGNOSIS — I10 Essential (primary) hypertension: Secondary | ICD-10-CM

## 2022-07-08 DIAGNOSIS — B9620 Unspecified Escherichia coli [E. coli] as the cause of diseases classified elsewhere: Secondary | ICD-10-CM | POA: Diagnosis not present

## 2022-07-08 DIAGNOSIS — I129 Hypertensive chronic kidney disease with stage 1 through stage 4 chronic kidney disease, or unspecified chronic kidney disease: Secondary | ICD-10-CM | POA: Diagnosis not present

## 2022-07-08 DIAGNOSIS — N1832 Chronic kidney disease, stage 3b: Secondary | ICD-10-CM | POA: Diagnosis not present

## 2022-07-08 NOTE — Patient Outreach (Signed)
Late entry for 07/07/22.  [place] Coordinator follow up. Mr. Omdahl resides in [place]. Screening for care coordination services as benefit of health plan and Primary Care Provider.   PCP office [facility] has Upstream care management.   Met with [employer] Education officer, museum. Recent discharge appeal was lost. Currently in reconsideration status. From home with spouse and caregiver assistance. Transition plans pending. Remaining in SNF under private pay vs returning home.   Will continue to follow.   Marthenia Rolling, MSN, RN,BSN [place] Acute Care Coordinator [phone] (Direct dial)

## 2022-07-08 NOTE — Progress Notes (Signed)
Called patient home phone and voicemail multiple times with no answer. Left voicemail on both phones. Will let scheduler know

## 2022-07-09 DIAGNOSIS — I69828 Other speech and language deficits following other cerebrovascular disease: Secondary | ICD-10-CM | POA: Diagnosis not present

## 2022-07-09 DIAGNOSIS — N390 Urinary tract infection, site not specified: Secondary | ICD-10-CM | POA: Diagnosis not present

## 2022-07-09 DIAGNOSIS — M6281 Muscle weakness (generalized): Secondary | ICD-10-CM | POA: Diagnosis not present

## 2022-07-09 DIAGNOSIS — I69322 Dysarthria following cerebral infarction: Secondary | ICD-10-CM | POA: Diagnosis not present

## 2022-07-09 DIAGNOSIS — I69891 Dysphagia following other cerebrovascular disease: Secondary | ICD-10-CM | POA: Diagnosis not present

## 2022-07-09 DIAGNOSIS — R2689 Other abnormalities of gait and mobility: Secondary | ICD-10-CM | POA: Diagnosis not present

## 2022-07-11 DIAGNOSIS — I69891 Dysphagia following other cerebrovascular disease: Secondary | ICD-10-CM | POA: Diagnosis not present

## 2022-07-11 DIAGNOSIS — I69322 Dysarthria following cerebral infarction: Secondary | ICD-10-CM | POA: Diagnosis not present

## 2022-07-11 DIAGNOSIS — R2689 Other abnormalities of gait and mobility: Secondary | ICD-10-CM | POA: Diagnosis not present

## 2022-07-11 DIAGNOSIS — N390 Urinary tract infection, site not specified: Secondary | ICD-10-CM | POA: Diagnosis not present

## 2022-07-11 DIAGNOSIS — M6281 Muscle weakness (generalized): Secondary | ICD-10-CM | POA: Diagnosis not present

## 2022-07-11 DIAGNOSIS — I69828 Other speech and language deficits following other cerebrovascular disease: Secondary | ICD-10-CM | POA: Diagnosis not present

## 2022-07-12 DIAGNOSIS — I4819 Other persistent atrial fibrillation: Secondary | ICD-10-CM | POA: Diagnosis not present

## 2022-07-12 DIAGNOSIS — R2689 Other abnormalities of gait and mobility: Secondary | ICD-10-CM | POA: Diagnosis not present

## 2022-07-12 DIAGNOSIS — N390 Urinary tract infection, site not specified: Secondary | ICD-10-CM | POA: Diagnosis not present

## 2022-07-12 DIAGNOSIS — B9620 Unspecified Escherichia coli [E. coli] as the cause of diseases classified elsewhere: Secondary | ICD-10-CM | POA: Diagnosis not present

## 2022-07-12 DIAGNOSIS — M6281 Muscle weakness (generalized): Secondary | ICD-10-CM | POA: Diagnosis not present

## 2022-07-12 DIAGNOSIS — R2681 Unsteadiness on feet: Secondary | ICD-10-CM | POA: Diagnosis not present

## 2022-07-12 DIAGNOSIS — N1832 Chronic kidney disease, stage 3b: Secondary | ICD-10-CM | POA: Diagnosis not present

## 2022-07-12 DIAGNOSIS — I69891 Dysphagia following other cerebrovascular disease: Secondary | ICD-10-CM | POA: Diagnosis not present

## 2022-07-12 DIAGNOSIS — I69828 Other speech and language deficits following other cerebrovascular disease: Secondary | ICD-10-CM | POA: Diagnosis not present

## 2022-07-12 DIAGNOSIS — I69322 Dysarthria following cerebral infarction: Secondary | ICD-10-CM | POA: Diagnosis not present

## 2022-07-12 DIAGNOSIS — E785 Hyperlipidemia, unspecified: Secondary | ICD-10-CM | POA: Diagnosis not present

## 2022-07-12 DIAGNOSIS — I69354 Hemiplegia and hemiparesis following cerebral infarction affecting left non-dominant side: Secondary | ICD-10-CM | POA: Diagnosis not present

## 2022-07-12 DIAGNOSIS — I129 Hypertensive chronic kidney disease with stage 1 through stage 4 chronic kidney disease, or unspecified chronic kidney disease: Secondary | ICD-10-CM | POA: Diagnosis not present

## 2022-07-12 DIAGNOSIS — I2510 Atherosclerotic heart disease of native coronary artery without angina pectoris: Secondary | ICD-10-CM | POA: Diagnosis not present

## 2022-07-12 DIAGNOSIS — N202 Calculus of kidney with calculus of ureter: Secondary | ICD-10-CM | POA: Diagnosis not present

## 2022-07-13 DIAGNOSIS — M6281 Muscle weakness (generalized): Secondary | ICD-10-CM | POA: Diagnosis not present

## 2022-07-13 DIAGNOSIS — R2689 Other abnormalities of gait and mobility: Secondary | ICD-10-CM | POA: Diagnosis not present

## 2022-07-13 DIAGNOSIS — I69322 Dysarthria following cerebral infarction: Secondary | ICD-10-CM | POA: Diagnosis not present

## 2022-07-13 DIAGNOSIS — I69891 Dysphagia following other cerebrovascular disease: Secondary | ICD-10-CM | POA: Diagnosis not present

## 2022-07-13 DIAGNOSIS — I69828 Other speech and language deficits following other cerebrovascular disease: Secondary | ICD-10-CM | POA: Diagnosis not present

## 2022-07-13 DIAGNOSIS — N390 Urinary tract infection, site not specified: Secondary | ICD-10-CM | POA: Diagnosis not present

## 2022-07-14 DIAGNOSIS — M6281 Muscle weakness (generalized): Secondary | ICD-10-CM | POA: Diagnosis not present

## 2022-07-14 DIAGNOSIS — I69322 Dysarthria following cerebral infarction: Secondary | ICD-10-CM | POA: Diagnosis not present

## 2022-07-14 DIAGNOSIS — I69828 Other speech and language deficits following other cerebrovascular disease: Secondary | ICD-10-CM | POA: Diagnosis not present

## 2022-07-14 DIAGNOSIS — I69891 Dysphagia following other cerebrovascular disease: Secondary | ICD-10-CM | POA: Diagnosis not present

## 2022-07-14 DIAGNOSIS — R2689 Other abnormalities of gait and mobility: Secondary | ICD-10-CM | POA: Diagnosis not present

## 2022-07-14 DIAGNOSIS — N390 Urinary tract infection, site not specified: Secondary | ICD-10-CM | POA: Diagnosis not present

## 2022-07-15 ENCOUNTER — Encounter (HOSPITAL_COMMUNITY): Payer: Self-pay

## 2022-07-15 ENCOUNTER — Inpatient Hospital Stay (HOSPITAL_COMMUNITY): Admission: RE | Admit: 2022-07-15 | Payer: Medicare Other | Source: Ambulatory Visit

## 2022-07-15 DIAGNOSIS — N1832 Chronic kidney disease, stage 3b: Secondary | ICD-10-CM | POA: Diagnosis not present

## 2022-07-15 DIAGNOSIS — R2681 Unsteadiness on feet: Secondary | ICD-10-CM | POA: Diagnosis not present

## 2022-07-15 DIAGNOSIS — I69322 Dysarthria following cerebral infarction: Secondary | ICD-10-CM | POA: Diagnosis not present

## 2022-07-15 DIAGNOSIS — I4819 Other persistent atrial fibrillation: Secondary | ICD-10-CM | POA: Diagnosis not present

## 2022-07-15 DIAGNOSIS — B9620 Unspecified Escherichia coli [E. coli] as the cause of diseases classified elsewhere: Secondary | ICD-10-CM | POA: Diagnosis not present

## 2022-07-15 DIAGNOSIS — N390 Urinary tract infection, site not specified: Secondary | ICD-10-CM | POA: Diagnosis not present

## 2022-07-15 DIAGNOSIS — M6281 Muscle weakness (generalized): Secondary | ICD-10-CM | POA: Diagnosis not present

## 2022-07-15 DIAGNOSIS — I69891 Dysphagia following other cerebrovascular disease: Secondary | ICD-10-CM | POA: Diagnosis not present

## 2022-07-15 DIAGNOSIS — I69354 Hemiplegia and hemiparesis following cerebral infarction affecting left non-dominant side: Secondary | ICD-10-CM | POA: Diagnosis not present

## 2022-07-15 DIAGNOSIS — I69828 Other speech and language deficits following other cerebrovascular disease: Secondary | ICD-10-CM | POA: Diagnosis not present

## 2022-07-15 DIAGNOSIS — E785 Hyperlipidemia, unspecified: Secondary | ICD-10-CM | POA: Diagnosis not present

## 2022-07-15 DIAGNOSIS — N202 Calculus of kidney with calculus of ureter: Secondary | ICD-10-CM | POA: Diagnosis not present

## 2022-07-15 DIAGNOSIS — I129 Hypertensive chronic kidney disease with stage 1 through stage 4 chronic kidney disease, or unspecified chronic kidney disease: Secondary | ICD-10-CM | POA: Diagnosis not present

## 2022-07-15 DIAGNOSIS — I2510 Atherosclerotic heart disease of native coronary artery without angina pectoris: Secondary | ICD-10-CM | POA: Diagnosis not present

## 2022-07-16 ENCOUNTER — Encounter (HOSPITAL_COMMUNITY)
Admission: RE | Admit: 2022-07-16 | Discharge: 2022-07-16 | Disposition: A | Discharge status: Home / Self Care | Payer: Medicare Other | Source: Ambulatory Visit | Attending: Urology | Admitting: Urology

## 2022-07-16 ENCOUNTER — Other Ambulatory Visit: Payer: Self-pay

## 2022-07-16 ENCOUNTER — Encounter (HOSPITAL_COMMUNITY): Payer: Self-pay

## 2022-07-16 DIAGNOSIS — N202 Calculus of kidney with calculus of ureter: Secondary | ICD-10-CM | POA: Diagnosis not present

## 2022-07-16 DIAGNOSIS — Z87891 Personal history of nicotine dependence: Secondary | ICD-10-CM | POA: Diagnosis not present

## 2022-07-16 DIAGNOSIS — I129 Hypertensive chronic kidney disease with stage 1 through stage 4 chronic kidney disease, or unspecified chronic kidney disease: Secondary | ICD-10-CM | POA: Insufficient documentation

## 2022-07-16 DIAGNOSIS — Z951 Presence of aortocoronary bypass graft: Secondary | ICD-10-CM | POA: Insufficient documentation

## 2022-07-16 DIAGNOSIS — Z8673 Personal history of transient ischemic attack (TIA), and cerebral infarction without residual deficits: Secondary | ICD-10-CM | POA: Diagnosis not present

## 2022-07-16 DIAGNOSIS — I69891 Dysphagia following other cerebrovascular disease: Secondary | ICD-10-CM | POA: Diagnosis not present

## 2022-07-16 DIAGNOSIS — I2510 Atherosclerotic heart disease of native coronary artery without angina pectoris: Secondary | ICD-10-CM | POA: Diagnosis not present

## 2022-07-16 DIAGNOSIS — I10 Essential (primary) hypertension: Secondary | ICD-10-CM

## 2022-07-16 DIAGNOSIS — Z7901 Long term (current) use of anticoagulants: Secondary | ICD-10-CM | POA: Insufficient documentation

## 2022-07-16 DIAGNOSIS — I480 Paroxysmal atrial fibrillation: Secondary | ICD-10-CM | POA: Diagnosis not present

## 2022-07-16 DIAGNOSIS — Z01812 Encounter for preprocedural laboratory examination: Secondary | ICD-10-CM | POA: Insufficient documentation

## 2022-07-16 DIAGNOSIS — E785 Hyperlipidemia, unspecified: Secondary | ICD-10-CM | POA: Diagnosis not present

## 2022-07-16 DIAGNOSIS — N201 Calculus of ureter: Secondary | ICD-10-CM | POA: Insufficient documentation

## 2022-07-16 DIAGNOSIS — I69828 Other speech and language deficits following other cerebrovascular disease: Secondary | ICD-10-CM | POA: Diagnosis not present

## 2022-07-16 DIAGNOSIS — N182 Chronic kidney disease, stage 2 (mild): Secondary | ICD-10-CM | POA: Insufficient documentation

## 2022-07-16 DIAGNOSIS — N390 Urinary tract infection, site not specified: Secondary | ICD-10-CM | POA: Diagnosis not present

## 2022-07-16 DIAGNOSIS — Z955 Presence of coronary angioplasty implant and graft: Secondary | ICD-10-CM | POA: Insufficient documentation

## 2022-07-16 DIAGNOSIS — R2681 Unsteadiness on feet: Secondary | ICD-10-CM | POA: Diagnosis not present

## 2022-07-16 DIAGNOSIS — I69322 Dysarthria following cerebral infarction: Secondary | ICD-10-CM | POA: Diagnosis not present

## 2022-07-16 DIAGNOSIS — M6281 Muscle weakness (generalized): Secondary | ICD-10-CM | POA: Diagnosis not present

## 2022-07-16 LAB — BASIC METABOLIC PANEL
Anion gap: 9 (ref 5–15)
BUN: 26 mg/dL — ABNORMAL HIGH (ref 8–23)
CO2: 24 mmol/L (ref 22–32)
Calcium: 9 mg/dL (ref 8.9–10.3)
Chloride: 107 mmol/L (ref 98–111)
Creatinine, Ser: 1.38 mg/dL — ABNORMAL HIGH (ref 0.61–1.24)
GFR, Estimated: 48 mL/min — ABNORMAL LOW (ref >60)
Glucose, Bld: 92 mg/dL (ref 70–99)
Potassium: 4.1 mmol/L (ref 3.5–5.1)
Sodium: 140 mmol/L (ref 135–145)

## 2022-07-16 LAB — CBC
HCT: 46.2 % (ref 39.0–52.0)
Hemoglobin: 14.3 g/dL (ref 13.0–17.0)
MCH: 28.8 pg (ref 26.0–34.0)
MCHC: 31 g/dL (ref 30.0–36.0)
MCV: 93 fL (ref 80.0–100.0)
Platelets: 229 10*3/uL (ref 150–400)
RBC: 4.97 MIL/uL (ref 4.22–5.81)
RDW: 15.4 % (ref 11.5–15.5)
WBC: 7.4 10*3/uL (ref 4.0–10.5)
nRBC: 0 % (ref 0.0–0.2)

## 2022-07-16 NOTE — Progress Notes (Addendum)
Anesthesia note:  Bowel prep reminder: NA   PCP - Jillyn Ledger FNP Cardiologist - Other- neuro-Dr. J. Love  Chest x-ray - 05/13/22-epic EKG - 04/21/22-epic Stress Test -  ECHO - no Cardiac Cath - 2016 CABG-no Pacemaker/ICD device last checked:no  Sleep Study - no CPAP -   Pt is pre diabetic-no CBG at PAT visit- Fasting Blood Sugar at home- Checks Blood Sugar _____  Blood Thinner:Eliquis last dose 07/15/22 pm Blood Thinner Instructions: Aspirin Instructions: Last Dose:  Anesthesia review: Yes   reason:Stroke and rehab pt  Patient denies shortness of breath, fever, cough and chest pain at PAT appointment.  Pt is in a wheelchair. He has been at home with wife and home health Aid until a hospitalization in January 2024. He is in a rehab facility in [place]. [facility]. 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 Wife will be here on DOS   Patient verbalized understanding of instructions that were given to them at the PAT appointment. Patient was also instructed that they will need to review over the PAT instructions again at home before surgery.yes Pre op instructions were called to [place]  M-F daytime nurse at [facility]. And copy given to the wife and to the Pt.

## 2022-07-16 NOTE — Patient Instructions (Signed)
DUE TO COVID-19 ONLY TWO VISITORS  (aged 87 and older)  ARE ALLOWED TO COME WITH YOU AND STAY IN THE WAITING ROOM ONLY DURING PRE OP AND PROCEDURE.   **NO VISITORS ARE ALLOWED IN THE SHORT STAY AREA OR RECOVERY ROOM!!**  IF YOU WILL BE ADMITTED INTO THE HOSPITAL YOU ARE ALLOWED ONLY FOUR SUPPORT PEOPLE DURING VISITATION HOURS ONLY (7 AM -8PM)   The support person(s) must pass our screening, gel in and out, and wear a mask at all times, including in the patient's room. Patients must also wear a mask when staff or their support person are in the room. Visitors GUEST BADGE MUST BE WORN VISIBLY  One adult visitor may remain with you overnight and MUST be in the room by 8 P.M.     Your procedure is scheduled on:  07/20/21   Report to [facility] Main Entrance    Report to admitting at  9:15 AM   Call this number if you have problems the morning of surgery [phone]   Do not eat food  or drink:After Midnight.                     If you have questions, please contact your surgeon's office.       Oral Hygiene is also important to reduce your risk of infection.                                    Remember - BRUSH YOUR TEETH THE MORNING OF SURGERY WITH YOUR REGULAR TOOTHPASTE  DENTURES WILL BE REMOVED PRIOR TO SURGERY PLEASE DO NOT APPLY "Poly grip" OR ADHESIVES!!!   Do NOT smoke after Midnight   Take these medicines the morning of surgery with A SIP OF WATER: Singular, Cymbalta, Rosuvastatin, Metoprolol, Amlodipine, Pantoprozole    Before surgery.Stop taking _Eliquis 72 Hours 3 days prior to day of surgery. last dose 2/2/24__as instructed by   Contact your Surgeon/Cardiologist for instructions on Anticoagulant Therapy prior to surgery.                               You may not have any metal on your body including  jewelry, and body piercing             Do not wear lotions, powders, cologne, or deodorant                Men may shave face and neck.   Do not bring  valuables to the hospital. [place].   Contacts, glasses, or bridgework may not be worn into surgery.   Bring small overnight bag day of surgery.   DO NOT [place]. PHARMACY WILL DISPENSE MEDICATIONS LISTED ON YOUR MEDICATION LIST TO YOU DURING YOUR ADMISSION [place]!    Patients discharged on the day of surgery will not be allowed to drive home.  Someone NEEDS to stay with you for the first 24 hours after anesthesia.   Special Instructions: Bring a copy of your healthcare power of attorney and living will documents  the day of surgery if you haven't scanned them before.              Please read over the  following fact sheets you were given: IF YOU HAVE QUESTIONS ABOUT YOUR PRE-OP INSTRUCTIONS PLEASE CALL [phone]    [facility] - Preparing for Surgery Before surgery, you can play an important role.  Because skin is not sterile, your skin needs to be as free of germs as possible.  You can reduce the number of germs on your skin by washing with CHG (chlorahexidine gluconate) soap before surgery.  CHG is an antiseptic cleaner which kills germs and bonds with the skin to continue killing germs even after washing. Please DO NOT use if you have an allergy to CHG or antibacterial soaps.  If your skin becomes reddened/irritated stop using the CHG and inform your nurse when you arrive at Short Stay.  You may shave your face/neck. Please follow these instructions carefully:  1.  Shower with CHG Soap the night before surgery and the  morning of Surgery.  2.  If you choose to wash your hair, wash your hair first as usual with your  normal  shampoo.  3.  After you shampoo, rinse your hair and body thoroughly to remove the  shampoo.                            4.  Use CHG as you would any other liquid soap.  You can apply chg directly  to the skin and wash                       Gently with a scrungie or clean  washcloth.  5.  Apply the CHG Soap to your body ONLY FROM THE NECK DOWN.   Do not use on face/ open                           Wound or open sores. Avoid contact with eyes, ears mouth and genitals (private parts).                       Wash face,  Genitals (private parts) with your normal soap.             6.  Wash thoroughly, paying special attention to the area where your surgery  will be performed.  7.  Thoroughly rinse your body with warm water from the neck down.  8.  DO NOT shower/wash with your normal soap after using and rinsing off  the CHG Soap.             9.  Pat yourself dry with a clean towel.            10.  Wear clean pajamas.            11.  Place clean sheets on your bed the night of your first shower and do not  sleep with pets. Day of Surgery : Do not apply any lotions/deodorants the morning of surgery.  Please wear clean clothes to the hospital/surgery center.  FAILURE TO FOLLOW THESE INSTRUCTIONS MAY RESULT IN THE CANCELLATION OF YOUR SURGERY   ________________________________________________________________________

## 2022-07-17 DIAGNOSIS — N390 Urinary tract infection, site not specified: Secondary | ICD-10-CM | POA: Diagnosis not present

## 2022-07-17 DIAGNOSIS — M6281 Muscle weakness (generalized): Secondary | ICD-10-CM | POA: Diagnosis not present

## 2022-07-17 DIAGNOSIS — I69891 Dysphagia following other cerebrovascular disease: Secondary | ICD-10-CM | POA: Diagnosis not present

## 2022-07-17 DIAGNOSIS — I69322 Dysarthria following cerebral infarction: Secondary | ICD-10-CM | POA: Diagnosis not present

## 2022-07-17 DIAGNOSIS — R2681 Unsteadiness on feet: Secondary | ICD-10-CM | POA: Diagnosis not present

## 2022-07-17 DIAGNOSIS — I69828 Other speech and language deficits following other cerebrovascular disease: Secondary | ICD-10-CM | POA: Diagnosis not present

## 2022-07-18 DIAGNOSIS — I69891 Dysphagia following other cerebrovascular disease: Secondary | ICD-10-CM | POA: Diagnosis not present

## 2022-07-18 DIAGNOSIS — I69322 Dysarthria following cerebral infarction: Secondary | ICD-10-CM | POA: Diagnosis not present

## 2022-07-18 DIAGNOSIS — M6281 Muscle weakness (generalized): Secondary | ICD-10-CM | POA: Diagnosis not present

## 2022-07-18 DIAGNOSIS — N390 Urinary tract infection, site not specified: Secondary | ICD-10-CM | POA: Diagnosis not present

## 2022-07-18 DIAGNOSIS — I69828 Other speech and language deficits following other cerebrovascular disease: Secondary | ICD-10-CM | POA: Diagnosis not present

## 2022-07-18 DIAGNOSIS — R2681 Unsteadiness on feet: Secondary | ICD-10-CM | POA: Diagnosis not present

## 2022-07-19 DIAGNOSIS — I2510 Atherosclerotic heart disease of native coronary artery without angina pectoris: Secondary | ICD-10-CM | POA: Diagnosis not present

## 2022-07-19 DIAGNOSIS — N202 Calculus of kidney with calculus of ureter: Secondary | ICD-10-CM | POA: Diagnosis not present

## 2022-07-19 DIAGNOSIS — I129 Hypertensive chronic kidney disease with stage 1 through stage 4 chronic kidney disease, or unspecified chronic kidney disease: Secondary | ICD-10-CM | POA: Diagnosis not present

## 2022-07-19 DIAGNOSIS — N1832 Chronic kidney disease, stage 3b: Secondary | ICD-10-CM | POA: Diagnosis not present

## 2022-07-19 DIAGNOSIS — E785 Hyperlipidemia, unspecified: Secondary | ICD-10-CM | POA: Diagnosis not present

## 2022-07-19 DIAGNOSIS — I69828 Other speech and language deficits following other cerebrovascular disease: Secondary | ICD-10-CM | POA: Diagnosis not present

## 2022-07-19 DIAGNOSIS — I69354 Hemiplegia and hemiparesis following cerebral infarction affecting left non-dominant side: Secondary | ICD-10-CM | POA: Diagnosis not present

## 2022-07-19 DIAGNOSIS — N390 Urinary tract infection, site not specified: Secondary | ICD-10-CM | POA: Diagnosis not present

## 2022-07-19 DIAGNOSIS — B9620 Unspecified Escherichia coli [E. coli] as the cause of diseases classified elsewhere: Secondary | ICD-10-CM | POA: Diagnosis not present

## 2022-07-19 DIAGNOSIS — M6281 Muscle weakness (generalized): Secondary | ICD-10-CM | POA: Diagnosis not present

## 2022-07-19 DIAGNOSIS — R2681 Unsteadiness on feet: Secondary | ICD-10-CM | POA: Diagnosis not present

## 2022-07-19 DIAGNOSIS — I69891 Dysphagia following other cerebrovascular disease: Secondary | ICD-10-CM | POA: Diagnosis not present

## 2022-07-19 DIAGNOSIS — I4819 Other persistent atrial fibrillation: Secondary | ICD-10-CM | POA: Diagnosis not present

## 2022-07-19 DIAGNOSIS — I69322 Dysarthria following cerebral infarction: Secondary | ICD-10-CM | POA: Diagnosis not present

## 2022-07-19 NOTE — Progress Notes (Signed)
Anesthesia Chart Review   Case: [id number] Date/Time: 07/20/22 1115   Procedure: LEFT URETEROSCOPY/HOLMIUM LASER/STENT PLACEMENT (Left) - 60 MINUTES NEEDED FOR CASE   Anesthesia type: General   Pre-op diagnosis: LEFT URETERAL STONE   Location: [facility] ROOM 03 / [facility]   Surgeons: Vira Agar, MD       DISCUSSION:89 y.o. former smoker with h/o HTN, CAD (s/p CABG, mitral valve repair and a Maze procedure 2005, DES 2016), PAF, CKD Stage II, CVA recurrent embolic right MCA, left ureteral stone scheduled for above procedure 07/20/2022 with Dr. Terrilee Files.   Pt last seen by cardiology 05/31/2022. Per OV note stable at this visit with 6 month follow up recommended.   Procedure cancelled 06/22/2022, Eliquis not held.   Last dose of Eliquis 07/15/22 pm dose per patient.  VS: BP 120/71   Pulse 64   Temp 36.5 C (Oral)   Resp 18   Ht '5\' 7"'$  (1.702 m)   Wt 78.5 kg   SpO2 97%   BMI 27.10 kg/m   PROVIDERS: Kristen Loader, FNP is PCP   Martinique, Peter, MD is Cardiologist  LABS: Labs reviewed: Acceptable for surgery. (all labs ordered are listed, but only abnormal results are displayed)  Labs Reviewed  BASIC METABOLIC PANEL - Abnormal; Notable for the following components:      Result Value   BUN 26 (*)    Creatinine, Ser 1.38 (*)    GFR, Estimated 48 (*)    All other components within normal limits  CBC     IMAGES:   EKG:   CV: Echo 07/12/2017 Study Conclusions   - Left ventricle: The cavity size was normal. Systolic function was    normal. The estimated ejection fraction was in the range of 55%    to 60%. Wall motion was normal; there were no regional wall    motion abnormalities. Features are consistent with a pseudonormal    left ventricular filling pattern, with concomitant abnormal    relaxation and increased filling pressure (grade 2 diastolic    dysfunction).  - Aortic valve: Transvalvular velocity was within the normal range.    There was no stenosis. There was no  regurgitation.  - Aorta: Ascending aortic diameter: 38 mm (S).  - Ascending aorta: The ascending aorta was mildly dilated.  - Mitral valve: Prior procedures included surgical repair.    Transvalvular velocity was within the normal range. There was no    evidence for stenosis. There was mild regurgitation. Valve area    by pressure half-time: 2.29 cm^2.  - Left atrium: The atrium was moderately dilated.  - Right ventricle: The cavity size was normal. Wall thickness was    normal. Systolic function was normal.  - Atrial septum: No defect or patent foramen ovale was identified.  - Tricuspid valve: There was mild regurgitation.  - Pulmonary arteries: Systolic pressure was severely increased. PA    peak pressure: 56 mm Hg (S).  Past Medical History:  Diagnosis Date   A-fib [facility])    Arthritis    "some in my joints" (01/24/2015)   CKD (chronic kidney disease), stage II    Coronary artery disease    a. s/p CABG in 2005 with MV repair and MAZE procedure. b. s/p DES to prox RCA 01/2015.    CVA (cerebrovascular accident) [facility]) ~ 2014   RIGHT BRAIN; denies residual on 01/24/2015   Dysphagia    Esophagitis    Distal esophagitis   GERD (gastroesophageal reflux disease)  Heart murmur    History of hiatal hernia    History of kidney stones    History of recurrent TIAs    Hyperlipidemia    Hypertension    Hypertensive vascular disease    MVP (mitral valve prolapse)    Nephrolithiasis    Odynophagia    PAF (paroxysmal atrial fibrillation) (HCC)    Transudative pleural effusion     Past Surgical History:  Procedure Laterality Date   APPENDECTOMY  1960's   CARDIAC CATHETERIZATION  03/19/2004   CARDIAC CATHETERIZATION N/A 01/24/2015   Procedure: Left Heart Cath and Coronary Angiography;  Surgeon: Peter M Martinique, MD;  Location: [place] CV LAB;  Service: Cardiovascular;  Laterality: N/A;   CARDIAC CATHETERIZATION N/A 01/24/2015   Procedure: Coronary Stent Intervention;  Surgeon: Peter M  Martinique, MD;  Location: [place] CV LAB;  Service: Cardiovascular;  Laterality: N/A;   CORONARY ANGIOPLASTY WITH STENT PLACEMENT  01/24/2015   "1 stent"   CORONARY ARTERY BYPASS GRAFT  04/2004   LIMA GRAFT TO THE DISTAL LAD, SAPHENOUS VEIN GRAFT TO THE FIRST DIADGONAL BRANCH, SAPHENOUS VEIN GRAFT TO THE THIRD MARIGINAL BRANCH, AND SAPHENOUS VEIN GRAFT TO THE PDA   CYSTOSCOPY W/ URETERAL STENT PLACEMENT Left 06/04/2022   Procedure: CYSTOSCOPY WITH RETROGRADE PYELOGRAM/LEFT URETERAL STENT PLACEMENT;  Surgeon: Vira Agar, MD;  Location: [place];  Service: Urology;  Laterality: Left;   IR ANGIO VERTEBRAL SEL SUBCLAVIAN INNOMINATE UNI R MOD SED  07/11/2017   IR PERCUTANEOUS ART THROMBECTOMY/INFUSION INTRACRANIAL INC DIAG ANGIO  07/11/2017   MAZE  04/2004   MITRAL VALVE REPAIR  04/2004   RADIOLOGY WITH ANESTHESIA N/A 07/11/2017   Procedure: RADIOLOGY WITH ANESTHESIA;  Surgeon: Luanne Bras, MD;  Location: [place];  Service: Radiology;  Laterality: N/A;   TEE WITHOUT CARDIOVERSION  05/19/2011   Procedure: TRANSESOPHAGEAL ECHOCARDIOGRAM (TEE);  Surgeon: Peter Martinique, MD;  Location: [facility] ENDOSCOPY;  Service: Cardiovascular;  Laterality: N/A;   TONSILLECTOMY AND ADENOIDECTOMY  1944    MEDICATIONS:  acetaminophen (TYLENOL) 650 MG CR tablet   amLODipine (NORVASC) 10 MG tablet   apixaban (ELIQUIS) 5 MG TABS tablet   DULoxetine (CYMBALTA) 30 MG capsule   K Phos Mono-Sod Phos Di & Mono (PHOSPHA 250 NEUTRAL) 563-149-702 MG TABS   metoprolol tartrate (LOPRESSOR) 25 MG tablet   montelukast (SINGULAIR) 10 MG tablet   nitroGLYCERIN (NITROSTAT) 0.4 MG SL tablet   pantoprazole (PROTONIX) 40 MG tablet   polyethylene glycol (MIRALAX / GLYCOLAX) 17 g packet   rosuvastatin (CRESTOR) 20 MG tablet   No current facility-administered medications for this encounter.     Arthur Felix Ward, PA-C [facility]-Surgical Testing [phone]

## 2022-07-20 ENCOUNTER — Encounter (HOSPITAL_COMMUNITY): Payer: Self-pay | Admitting: Urology

## 2022-07-20 ENCOUNTER — Ambulatory Visit (HOSPITAL_COMMUNITY)
Admission: RE | Admit: 2022-07-20 | Discharge: 2022-07-20 | Disposition: A | Discharge status: Home / Self Care | Payer: Medicare Other | Source: Ambulatory Visit | Attending: Urology | Admitting: Urology

## 2022-07-20 ENCOUNTER — Ambulatory Visit (HOSPITAL_BASED_OUTPATIENT_CLINIC_OR_DEPARTMENT_OTHER): Payer: Medicare Other | Admitting: Anesthesiology

## 2022-07-20 ENCOUNTER — Encounter (HOSPITAL_COMMUNITY)
Admission: RE | Disposition: A | Discharge status: Home / Self Care | Payer: Self-pay | Source: Ambulatory Visit | Attending: Urology

## 2022-07-20 ENCOUNTER — Ambulatory Visit (HOSPITAL_COMMUNITY): Payer: Medicare Other | Admitting: Physician Assistant

## 2022-07-20 ENCOUNTER — Ambulatory Visit (HOSPITAL_COMMUNITY): Payer: Medicare Other

## 2022-07-20 ENCOUNTER — Other Ambulatory Visit: Payer: Self-pay

## 2022-07-20 DIAGNOSIS — I2510 Atherosclerotic heart disease of native coronary artery without angina pectoris: Secondary | ICD-10-CM | POA: Insufficient documentation

## 2022-07-20 DIAGNOSIS — I69322 Dysarthria following cerebral infarction: Secondary | ICD-10-CM | POA: Diagnosis not present

## 2022-07-20 DIAGNOSIS — Z87891 Personal history of nicotine dependence: Secondary | ICD-10-CM | POA: Insufficient documentation

## 2022-07-20 DIAGNOSIS — Z8673 Personal history of transient ischemic attack (TIA), and cerebral infarction without residual deficits: Secondary | ICD-10-CM | POA: Diagnosis not present

## 2022-07-20 DIAGNOSIS — E785 Hyperlipidemia, unspecified: Secondary | ICD-10-CM | POA: Diagnosis not present

## 2022-07-20 DIAGNOSIS — Z7901 Long term (current) use of anticoagulants: Secondary | ICD-10-CM | POA: Insufficient documentation

## 2022-07-20 DIAGNOSIS — I129 Hypertensive chronic kidney disease with stage 1 through stage 4 chronic kidney disease, or unspecified chronic kidney disease: Secondary | ICD-10-CM | POA: Insufficient documentation

## 2022-07-20 DIAGNOSIS — M6281 Muscle weakness (generalized): Secondary | ICD-10-CM | POA: Diagnosis not present

## 2022-07-20 DIAGNOSIS — I10 Essential (primary) hypertension: Secondary | ICD-10-CM

## 2022-07-20 DIAGNOSIS — Z09 Encounter for follow-up examination after completed treatment for conditions other than malignant neoplasm: Secondary | ICD-10-CM | POA: Insufficient documentation

## 2022-07-20 DIAGNOSIS — N182 Chronic kidney disease, stage 2 (mild): Secondary | ICD-10-CM | POA: Insufficient documentation

## 2022-07-20 DIAGNOSIS — Z955 Presence of coronary angioplasty implant and graft: Secondary | ICD-10-CM | POA: Diagnosis not present

## 2022-07-20 DIAGNOSIS — N202 Calculus of kidney with calculus of ureter: Secondary | ICD-10-CM | POA: Diagnosis not present

## 2022-07-20 DIAGNOSIS — R2681 Unsteadiness on feet: Secondary | ICD-10-CM | POA: Diagnosis not present

## 2022-07-20 DIAGNOSIS — Z951 Presence of aortocoronary bypass graft: Secondary | ICD-10-CM | POA: Insufficient documentation

## 2022-07-20 DIAGNOSIS — I69828 Other speech and language deficits following other cerebrovascular disease: Secondary | ICD-10-CM | POA: Diagnosis not present

## 2022-07-20 DIAGNOSIS — I480 Paroxysmal atrial fibrillation: Secondary | ICD-10-CM | POA: Diagnosis not present

## 2022-07-20 DIAGNOSIS — I69891 Dysphagia following other cerebrovascular disease: Secondary | ICD-10-CM | POA: Diagnosis not present

## 2022-07-20 DIAGNOSIS — N390 Urinary tract infection, site not specified: Secondary | ICD-10-CM | POA: Diagnosis not present

## 2022-07-20 HISTORY — PX: CYSTOSCOPY/URETEROSCOPY/HOLMIUM LASER/STENT PLACEMENT: SHX6546

## 2022-07-20 SURGERY — CYSTOSCOPY/URETEROSCOPY/HOLMIUM LASER/STENT PLACEMENT
Anesthesia: General | Laterality: Left

## 2022-07-20 MED ORDER — FENTANYL CITRATE (PF) 100 MCG/2ML IJ SOLN
INTRAMUSCULAR | Status: DC | PRN
  Administered 2022-07-20 (×2): 25 ug via INTRAVENOUS

## 2022-07-20 MED ORDER — CHLORHEXIDINE GLUCONATE 0.12 % MT SOLN
15.0000 mL | Freq: Once | OROMUCOSAL | Status: AC
  Administered 2022-07-20: 15 mL via OROMUCOSAL

## 2022-07-20 MED ORDER — ONDANSETRON HCL 4 MG/2ML IJ SOLN: 4.0000 mg | Freq: Four times a day (QID) | INTRAMUSCULAR | Status: DC | PRN

## 2022-07-20 MED ORDER — ONDANSETRON HCL 4 MG/2ML IJ SOLN
INTRAMUSCULAR | Status: AC
  Filled 2022-07-20: qty 2

## 2022-07-20 MED ORDER — LIDOCAINE HCL (PF) 2 % IJ SOLN
INTRAMUSCULAR | Status: AC
  Filled 2022-07-20: qty 5

## 2022-07-20 MED ORDER — NITROFURANTOIN MONOHYD MACRO 100 MG PO CAPS: 100.0000 mg | ORAL_CAPSULE | Freq: Two times a day (BID) | ORAL | 0 refills | Status: AC

## 2022-07-20 MED ORDER — ORAL CARE MOUTH RINSE: 15.0000 mL | Freq: Once | OROMUCOSAL | Status: AC

## 2022-07-20 MED ORDER — FENTANYL CITRATE (PF) 100 MCG/2ML IJ SOLN
INTRAMUSCULAR | Status: AC
  Filled 2022-07-20: qty 2

## 2022-07-20 MED ORDER — DEXAMETHASONE SODIUM PHOSPHATE 10 MG/ML IJ SOLN
INTRAMUSCULAR | Status: DC | PRN
  Administered 2022-07-20: 5 mg via INTRAVENOUS

## 2022-07-20 MED ORDER — PHENYLEPHRINE 80 MCG/ML (10ML) SYRINGE FOR IV PUSH (FOR BLOOD PRESSURE SUPPORT)
PREFILLED_SYRINGE | INTRAVENOUS | Status: AC
  Filled 2022-07-20: qty 10

## 2022-07-20 MED ORDER — SODIUM CHLORIDE 0.9 % IR SOLN
Status: DC | PRN
  Administered 2022-07-20: 3000 mL

## 2022-07-20 MED ORDER — PROPOFOL 10 MG/ML IV BOLUS
INTRAVENOUS | Status: DC | PRN
  Administered 2022-07-20: 120 mg via INTRAVENOUS

## 2022-07-20 MED ORDER — PHENYLEPHRINE HCL (PRESSORS) 10 MG/ML IV SOLN
INTRAVENOUS | Status: AC
  Filled 2022-07-20: qty 1

## 2022-07-20 MED ORDER — LACTATED RINGERS IV SOLN: INTRAVENOUS | Status: DC

## 2022-07-20 MED ORDER — CIPROFLOXACIN IN D5W 400 MG/200ML IV SOLN
400.0000 mg | INTRAVENOUS | Status: AC
  Administered 2022-07-20: 400 mg via INTRAVENOUS
  Filled 2022-07-20: qty 200

## 2022-07-20 MED ORDER — OXYCODONE HCL 5 MG PO TABS: 5.0000 mg | ORAL_TABLET | Freq: Once | ORAL | Status: DC | PRN

## 2022-07-20 MED ORDER — IOHEXOL 300 MG/ML  SOLN
INTRAMUSCULAR | Status: DC | PRN
  Administered 2022-07-20: 10 mL

## 2022-07-20 MED ORDER — PROPOFOL 10 MG/ML IV BOLUS
INTRAVENOUS | Status: AC
  Filled 2022-07-20: qty 20

## 2022-07-20 MED ORDER — MELOXICAM 15 MG PO TABS: 15.0000 mg | ORAL_TABLET | Freq: Every day | ORAL | 0 refills | Status: AC | PRN

## 2022-07-20 MED ORDER — TRAMADOL HCL 50 MG PO TABS: 50.0000 mg | ORAL_TABLET | Freq: Four times a day (QID) | ORAL | 0 refills | Status: AC | PRN

## 2022-07-20 MED ORDER — LIDOCAINE HCL (CARDIAC) PF 100 MG/5ML IV SOSY
PREFILLED_SYRINGE | INTRAVENOUS | Status: DC | PRN
  Administered 2022-07-20: 50 mg via INTRAVENOUS

## 2022-07-20 MED ORDER — 0.9 % SODIUM CHLORIDE (POUR BTL) OPTIME
TOPICAL | Status: DC | PRN
  Administered 2022-07-20: 1000 mL

## 2022-07-20 MED ORDER — PHENYLEPHRINE 80 MCG/ML (10ML) SYRINGE FOR IV PUSH (FOR BLOOD PRESSURE SUPPORT)
PREFILLED_SYRINGE | INTRAVENOUS | Status: DC | PRN
  Administered 2022-07-20 (×5): 160 ug via INTRAVENOUS

## 2022-07-20 MED ORDER — PHENYLEPHRINE HCL-NACL 20-0.9 MG/250ML-% IV SOLN
INTRAVENOUS | Status: DC | PRN
  Administered 2022-07-20: 50 ug/min via INTRAVENOUS

## 2022-07-20 MED ORDER — DEXAMETHASONE SODIUM PHOSPHATE 10 MG/ML IJ SOLN
INTRAMUSCULAR | Status: AC
  Filled 2022-07-20: qty 1

## 2022-07-20 MED ORDER — FENTANYL CITRATE PF 50 MCG/ML IJ SOSY: 25.0000 ug | PREFILLED_SYRINGE | INTRAMUSCULAR | Status: DC | PRN

## 2022-07-20 MED ORDER — ONDANSETRON HCL 4 MG/2ML IJ SOLN
INTRAMUSCULAR | Status: DC | PRN
  Administered 2022-07-20: 4 mg via INTRAVENOUS

## 2022-07-20 MED ORDER — OXYCODONE HCL 5 MG/5ML PO SOLN: 5.0000 mg | Freq: Once | ORAL | Status: DC | PRN

## 2022-07-20 SURGICAL SUPPLY — 28 items
BAG COUNTER SPONGE SURGICOUNT (BAG) IMPLANT
BAG SPNG CNTER NS LX DISP (BAG)
BAG URO CATCHER STRL LF (MISCELLANEOUS) ×1 IMPLANT
BASKET ZERO TIP NITINOL 2.4FR (BASKET) IMPLANT
BSKT STON RTRVL ZERO TP 2.4FR (BASKET) ×1
CATH URETERAL DUAL LUMEN 10F (MISCELLANEOUS) ×1 IMPLANT
CATH URETL OPEN END 6FR 70 (CATHETERS) IMPLANT
CLOTH BEACON ORANGE TIMEOUT ST (SAFETY) ×1 IMPLANT
DRSG TEGADERM 2-3/8X2-3/4 SM (GAUZE/BANDAGES/DRESSINGS) IMPLANT
GLOVE SS BIOGEL STRL SZ 7 (GLOVE) ×1 IMPLANT
GLOVE SURG LX STRL 7.5 STRW (GLOVE) ×1 IMPLANT
GOWN STRL REUS W/ TWL XL LVL3 (GOWN DISPOSABLE) ×1 IMPLANT
GOWN STRL REUS W/TWL XL LVL3 (GOWN DISPOSABLE) ×1
GUIDEWIRE STR DUAL SENSOR (WIRE) ×1 IMPLANT
GUIDEWIRE ZIPWRE .038 STRAIGHT (WIRE) ×1 IMPLANT
IV NS 1000ML (IV SOLUTION) ×1
IV NS 1000ML BAXH (IV SOLUTION) ×1 IMPLANT
KIT TURNOVER KIT A (KITS) IMPLANT
LASER FIB FLEXIVA PULSE ID 365 (Laser) IMPLANT
MANIFOLD NEPTUNE II (INSTRUMENTS) ×1 IMPLANT
PACK CYSTO (CUSTOM PROCEDURE TRAY) ×1 IMPLANT
SHEATH NAVIGATOR HD 11/13X36 (SHEATH) IMPLANT
SHEATH NAVIGATOR HD 12/14X46 (SHEATH) IMPLANT
STENT URET 6FRX26 CONTOUR (STENTS) IMPLANT
TRACTIP FLEXIVA PULS ID 200XHI (Laser) IMPLANT
TRACTIP FLEXIVA PULSE ID 200 (Laser) ×1
TUBING CONNECTING 10 (TUBING) ×1 IMPLANT
TUBING UROLOGY SET (TUBING) ×1 IMPLANT

## 2022-07-20 NOTE — Op Note (Signed)
Operative Note  Preoperative diagnosis:  1.  Left ureteral and renal stones 2. History of UTI 3. Indwelling ureteral stent  Postoperative diagnosis: 1.  same  Procedure(s): 1.  Left ureteroscopy with laser lithotripsy and basket extraction of stones 2. Cystoscopy  3. Left retrograde pyelogram 4. Left ureteral stent exchange (6x26cm) 5. Fluoroscopy with intraoperative interpretation  Surgeon: Donald Pore, MD  Assistants:  None  Anesthesia:  General  Complications:  None  EBL:  Minimal  Specimens: 1. stones for stone analysis (to be done at [facility])  Drains/Catheters: 1.  Left 6Fr x 26cm ureteral stent with tether string  Intraoperative findings:   Cystoscopy demonstrated enlarged prostate, edema adjacent to left ureteral orifice Left Ureteroscopy demonstrated a stone in the mid ureter, multiple small stones in the upper pole of the kidney Successful stent placement.   Description of procedure: After informed consent was obtained from the patient, the patient was identified and taken to the operating room and placed in the supine position.  General anesthesia was administered as well as perioperative IV antibiotics.  At the beginning of the case, a time-out was performed to properly identify the patient, the surgery to be performed, and the surgical site.  Sequential compression devices were applied to the lower extremities at the beginning of the case for DVT prophylaxis.  The patient was then placed in the dorsal lithotomy supine position, prepped and draped in sterile fashion.  Preliminary scout fluoroscopy revealed that there was a calcification area at the left mid ureter, which corresponds to the stone found on the preoperative CT scan. We then passed the 21-French rigid cystoscope through the urethra and into the bladder under vision without any difficulty, noting a normal urethra without strictures and an enlarged prostate.  A systematic evaluation of the  bladder revealed no evidence of any suspicious bladder lesions.  Ureteral orifices were in normal position.    The distal aspect of the ureteral stent was seen protruding from the left ureteral orifice.  We then used the alligator-tooth forceps and grasped the distal end of the ureteral stent and brought it out the urethral meatus while watching the proximal coil straighten out nicely on fluoroscopy. Through the ureteral stent, we then passed a 0.038 glide wire up to the level of the renal pelvis.  The ureteral stent was then removed, leaving the glide wire up the left ureter.  The cystoscope was withdrawn, and a dual lumen catheter was inserted over the glide wire into the distal ureter. A gentle retrograde pyelogram was performed, revealing a normal caliber ureter without any filling defects. There was mild hydronephrosis of the collecting system. There was a filling defect in the left mid ureter corresponding to the stone. A 0.038 sensor wire was then passed up to the level of the renal pelvis. The dual lumen was removed.  An 11/13Fr ureteral access sheath was carefully advanced up the ureter to the level of the UPJ over this wire under fluoroscopic guidance. The flexible ureteroscope was advanced into the collecting system via the access sheath. The collecting system was inspected. The calculus was identified at the mid ureter. Using the 242 micron holmium laser fiber, the stone was fragmented completely. A 2.2 Fr zero tip basket was used to remove the fragments under visual guidance. These were sent for chemical analysis. The ureteroscope was then advanced into the kidney. There were several small stones in an upper pole calyx. These were dusted with the laser. With the ureteroscope in the kidney, a  gentle pyelogram was performed to delineate the calyceal system and we evaluated the calyces systematically. We encountered no further stone fragments >2 mm. The rest of the stone fragments were very tiny and  these were  irrigated away gently. The calyces were re-inspected and there were no significant stone fragment residual.   We then withdrew the ureteroscope back down the ureter along with the access sheath, noting no evidence of any stones along the course of the ureter.  Prior to removing the ureteroscope, we did pass the Glidewire back up to the ureter to the renal pelvis.  Once the ureteroscope was removed, we then used the Glidewire under fluoroscopic guidance and passed up a 6-French x 26 cm double-pigtail ureteral stent up the ureter, making sure that the proximal and distal ends coiled within the kidney and bladder respectively.  Note that we left a tether string attached to the distal end of the ureteral stent and it exited the urethral meatus and was secured to the penile shaft.  The cystoscope was then advanced back into the bladder under vision.  We were able to see the distal stent coiling nicely within the bladder.  The bladder was then emptied with irrigation solution.  The cystoscope was then removed.    The patient tolerated the procedure well and there was no complication. Patient was awoken from anesthesia and taken to the recovery room in stable condition. I was present and scrubbed for the entirety of the case.  Plan:  Patient will be discharged home. He may remove the stent by pulling on the string in 2 days   G. Donald Pore MD [facility]  Pager: [phone]

## 2022-07-20 NOTE — Discharge Instructions (Addendum)
[facility] [phone] Post Ureteroscopy With or Without Stent Instructions **remove stent by pulling on string in 2 days**  Definitions:  Ureter: The duct that transports urine from the kidney to the bladder. Stent:   A plastic hollow tube that is placed into the ureter, from the kidney to the bladder to prevent the ureter from swelling shut.  GENERAL INSTRUCTIONS:  Despite the fact that no skin incisions were used, the area around the ureter and bladder is raw and irritated. The stent is a foreign body which will further irritate the bladder wall. This irritation is manifested by increased frequency of urination, both day and night, and by an increase in the urge to urinate. In some, the urge to urinate is present almost always. Sometimes the urge is strong enough that you may not be able to stop yourself from urinating. The only real cure is to remove the stent and then give time for the bladder wall to heal which can't be done until the danger of the ureter swelling shut has passed, which varies.  You may see some blood in your urine while the stent is in place and a few days afterwards. Do not be alarmed, even if the urine was clear for a while. Get off your feet and drink lots of fluids until clearing occurs. If you start to pass clots or don't improve, call us.  DIET: You may return to your normal diet immediately. Because of the raw surface of your bladder, alcohol, spicy foods, acid type foods and drinks with caffeine may cause irritation or frequency and should be used in moderation. To keep your urine flowing freely and to avoid constipation, drink plenty of fluids during the day ( 8-10 glasses ). Tip: Avoid cranberry juice because it is very acidic.  ACTIVITY: Your physical activity doesn't need to be restricted. However, if you are very active, you may see some blood in your urine. We suggest that you reduce your activity under these circumstances until the  bleeding has stopped.  BOWELS: It is important to keep your bowels regular during the postoperative period. Straining with bowel movements can cause bleeding. A bowel movement every other day is reasonable. Use a mild laxative if needed, such as Milk of Magnesia 2-3 tablespoons, or 2 Dulcolax tablets. Call if you continue to have problems. If you have been taking narcotics for pain, before, during or after your surgery, you may be constipated. Take a laxative if necessary.   MEDICATION: You should resume your pre-surgery medications unless told not to. In addition you will often be given an antibiotic to prevent infection and likely several as needed medications for stent related discomfort. These should be taken as prescribed until the bottles are finished unless you are having an unusual reaction to one of the drugs.  PROBLEMS YOU SHOULD REPORT TO [place]: Fevers over 100.5 Fahrenheit. Heavy bleeding, or clots ( See above notes about blood in urine ). Inability to urinate. Drug reactions ( hives, rash, nausea, vomiting, diarrhea ). Severe burning or pain with urination that is not improving.

## 2022-07-20 NOTE — Anesthesia Preprocedure Evaluation (Signed)
Anesthesia Evaluation  Patient identified by MRN, date of birth, ID band Patient awake    Reviewed: Allergy & Precautions, H&P , NPO status , Patient's Chart, lab work & pertinent test results  Airway Mallampati: II   Neck ROM: full    Dental   Pulmonary former smoker   breath sounds clear to auscultation       Cardiovascular hypertension, + CAD, + Cardiac Stents and + CABG  + dysrhythmias Atrial Fibrillation + Valvular Problems/Murmurs  Rhythm:regular Rate:Normal  S/p CABG and mitral valve repair   Neuro/Psych CVA    GI/Hepatic hiatal hernia,GERD  ,,  Endo/Other    Renal/GU Renal diseasestones     Musculoskeletal  (+) Arthritis ,    Abdominal   Peds  Hematology   Anesthesia Other Findings   Reproductive/Obstetrics                             Anesthesia Physical Anesthesia Plan  ASA: 3  Anesthesia Plan: General   Post-op Pain Management:    Induction: Intravenous  PONV Risk Score and Plan: 2 and Ondansetron, Dexamethasone and Treatment may vary due to age or medical condition  Airway Management Planned: LMA  Additional Equipment:   Intra-op Plan:   Post-operative Plan: Extubation in OR  Informed Consent: I have reviewed the patients History and Physical, chart, labs and discussed the procedure including the risks, benefits and alternatives for the proposed anesthesia with the patient or authorized representative who has indicated his/her understanding and acceptance.     Dental advisory given  Plan Discussed with: CRNA, Anesthesiologist and Surgeon  Anesthesia Plan Comments:        Anesthesia Quick Evaluation

## 2022-07-20 NOTE — Anesthesia Procedure Notes (Signed)
Procedure Name: LMA Insertion Date/Time: 07/20/2022 12:31 PM  Performed by: Lind Covert, CRNAPre-anesthesia Checklist: Patient identified, Emergency Drugs available, Suction available, Patient being monitored and Timeout performed Patient Re-evaluated:Patient Re-evaluated prior to induction Oxygen Delivery Method: Circle system utilized Preoxygenation: Pre-oxygenation with 100% oxygen Induction Type: IV induction LMA: LMA inserted LMA Size: 4.0 Tube type: Oral Number of attempts: 1 Placement Confirmation: positive ETCO2 and breath sounds checked- equal and bilateral Tube secured with: Tape Dental Injury: Teeth and Oropharynx as per pre-operative assessment

## 2022-07-20 NOTE — Progress Notes (Signed)
[place] RN contacts [phone] Scot Dock or Kristeen Miss) Med list update and NPO status obtained from Anne Shutter, RN via phone.

## 2022-07-20 NOTE — H&P (Signed)
H&P  History of Present Illness: Arthur [name] is a 89 y.o. year old M who presents today for left ureteroscopy with laser litho. He had a stent placed emergently ~6 weeks ago for an obstructing stone in the setting of a UTI.  Past Medical History:  Diagnosis Date   A-fib [facility])    Arthritis    "some in my joints" (01/24/2015)   CKD (chronic kidney disease), stage II    Coronary artery disease    a. s/p CABG in 2005 with MV repair and MAZE procedure. b. s/p DES to prox RCA 01/2015.    CVA (cerebrovascular accident) ([place]) ~ 2014   RIGHT BRAIN; denies residual on 01/24/2015   Dysphagia    Esophagitis    Distal esophagitis   GERD (gastroesophageal reflux disease)    Heart murmur    History of hiatal hernia    History of kidney stones    History of recurrent TIAs    Hyperlipidemia    Hypertension    Hypertensive vascular disease    MVP (mitral valve prolapse)    Nephrolithiasis    Odynophagia    PAF (paroxysmal atrial fibrillation) (HCC)    Transudative pleural effusion     Past Surgical History:  Procedure Laterality Date   APPENDECTOMY  1960's   CARDIAC CATHETERIZATION  03/19/2004   CARDIAC CATHETERIZATION N/A 01/24/2015   Procedure: Left Heart Cath and Coronary Angiography;  Surgeon: Peter M Martinique, MD;  Location: [facility];  Service: Cardiovascular;  Laterality: N/A;   CARDIAC CATHETERIZATION N/A 01/24/2015   Procedure: Coronary Stent Intervention;  Surgeon: Peter M Martinique, MD;  Location: [facility];  Service: Cardiovascular;  Laterality: N/A;   CORONARY ANGIOPLASTY WITH STENT PLACEMENT  01/24/2015   "1 stent"   CORONARY ARTERY BYPASS GRAFT  04/2004   LIMA GRAFT TO THE DISTAL LAD, SAPHENOUS VEIN GRAFT TO THE FIRST DIADGONAL BRANCH, SAPHENOUS VEIN GRAFT TO THE THIRD MARIGINAL BRANCH, AND SAPHENOUS VEIN GRAFT TO THE PDA   CYSTOSCOPY W/ URETERAL STENT PLACEMENT Left 06/04/2022   Procedure: CYSTOSCOPY WITH RETROGRADE PYELOGRAM/LEFT URETERAL STENT PLACEMENT;   Surgeon: Vira Agar, MD;  Location: [facility];  Service: Urology;  Laterality: Left;   IR ANGIO VERTEBRAL SEL SUBCLAVIAN INNOMINATE UNI R MOD SED  07/11/2017   IR PERCUTANEOUS ART THROMBECTOMY/INFUSION INTRACRANIAL INC DIAG ANGIO  07/11/2017   MAZE  04/2004   MITRAL VALVE REPAIR  04/2004   RADIOLOGY WITH ANESTHESIA N/A 07/11/2017   Procedure: RADIOLOGY WITH ANESTHESIA;  Surgeon: Luanne Bras, MD;  Location: [place];  Service: Radiology;  Laterality: N/A;   TEE WITHOUT CARDIOVERSION  05/19/2011   Procedure: TRANSESOPHAGEAL ECHOCARDIOGRAM (TEE);  Surgeon: Peter Martinique, MD;  Location: [facility];  Service: Cardiovascular;  Laterality: N/A;   TONSILLECTOMY AND ADENOIDECTOMY  1944    Home Medications:  Current Meds  Medication Sig   acetaminophen (TYLENOL) 650 MG CR tablet Take 650 mg by mouth daily as needed (knee pain).    amLODipine (NORVASC) 10 MG tablet Take 1 tablet (10 mg total) by mouth daily.   apixaban (ELIQUIS) 5 MG TABS tablet TAKE ONE TABLET BY MOUTH TWICE A DAY   DULoxetine (CYMBALTA) 30 MG capsule Take 30 mg by mouth daily.   K Phos Mono-Sod Phos Di & Mono (PHOSPHA 250 NEUTRAL) 155-852-130 MG TABS TAKE 1 TABLET BY MOUTH DAILY AFTER BREAKFAST   metoprolol tartrate (LOPRESSOR) 25 MG tablet TAKE 1 AND 1/2 TABLET BY MOUTH TWO TIMES A DAY   montelukast (  SINGULAIR) 10 MG tablet TAKE ONE TABLET BY MOUTH DAILY AFTER BREAKFAST (Patient taking differently: Take 10 mg by mouth daily after breakfast.)   nitroGLYCERIN (NITROSTAT) 0.4 MG SL tablet PLACE 1 TABLET (0.4 MG TOTAL) UNDER THE TONGUE EVERY 5 (FIVE) MINUTESAS NEEDED FOR CHEST PAIN (UP TO 3 DOSES).   pantoprazole (PROTONIX) 40 MG tablet TAKE 1 TABLET BY MOUTH DAILY   polyethylene glycol (MIRALAX / GLYCOLAX) 17 g packet Take 17 g by mouth daily. (Patient taking differently: Take 17 g by mouth daily. Mix in 4 oz. of water)   rosuvastatin (CRESTOR) 20 MG tablet TAKE 1 TABLET BY MOUTH DAILY    Allergies:  Allergies  Allergen  Reactions   Sulfamethoxazole-Trimethoprim     Other reaction(s): Unknown   Tape Other (See Comments)    PATIENT IS TAKING COUMADIN; HIS SKIN TEARS & BRUISES EASILY; PLEASE USE COBAN WRAP OR AN ALTERNATIVE TO MEDICAL TAPE!!   Lasix [Furosemide] Rash   Penicillins Rash    Has patient had a PCN reaction causing immediate rash, facial/tongue/throat swelling, SOB or lightheadedness with hypotension: Yes Has patient had a PCN reaction causing severe rash involving mucus membranes or skin necrosis: Unknown Has patient had a PCN reaction that required hospitalization: Unknown Has patient had a PCN reaction occurring within the last 10 years: unknown If all of the above answers are "NO", then may proceed with Cephalosporin use.    Sulfa Drugs Cross Reactors Rash    Family History  Problem Relation Age of Onset   Heart attack Mother    Stroke Father    Hypertension Father    Kidney failure Father     Social History:  reports that he quit smoking about 67 years ago. His smoking use included cigarettes. He has never used smokeless tobacco. He reports that he does not drink alcohol and does not use drugs.  ROS: A complete review of systems was performed.  All systems are negative except for pertinent findings as noted.  Physical Exam:  Vital signs in last 24 hours: Temp:  [98.1 F (36.7 C)] 98.1 F (36.7 C) (02/06 0936) Pulse Rate:  [76] 76 (02/06 0936) Resp:  [18] 18 (02/06 0936) BP: (136)/(91) 136/91 (02/06 0936) SpO2:  [99 %] 99 % (02/06 0936) Constitutional:  Alert and oriented, No acute distress Cardiovascular: Regular rate and rhythm Respiratory: Normal respiratory effort, Lungs clear bilaterally GI: Abdomen is soft, nontender, nondistended, no abdominal masses Lymphatic: No lymphadenopathy Neurologic: Grossly intact, no focal deficits Psychiatric: Normal mood and affect   Laboratory Data:  No results for input(s): "WBC", "HGB", "HCT", "PLT" in the last 72 hours.  No  results for input(s): "NA", "K", "CL", "GLUCOSE", "BUN", "CALCIUM", "CREATININE" in the last 72 hours.  Invalid input(s): "CO3"   No results found for this or any previous visit (from the past 24 hour(s)). No results found for this or any previous visit (from the past 240 hour(s)).  Renal Function: Recent Labs    07/16/22 1330  CREATININE 1.38*   Estimated Creatinine Clearance: 32.6 mL/min (A) (by C-G formula based on SCr of 1.38 mg/dL (H)).  Radiologic Imaging: No results found.  Assessment:  Arthur [name] is a 89 y.o. year old M with a left ureteral stone s/p stent placement  Plan:  To OR for left ureteroscopy, laser litho, stent replacement. Procedure and risks reviewed.   Donald Pore, MD 07/20/2022, 10:00 AM  [facility] Pager: [phone]

## 2022-07-20 NOTE — Transfer of Care (Signed)
Immediate Anesthesia Transfer of Care Note  Patient: Arthur [name]  Procedure(s) Performed: CYSTOSCOPY, LEFT URETEROSCOPY, HOLMIUM LASER, STENT PLACEMENT (Left)  Patient Location: PACU  Anesthesia Type:General  Level of Consciousness: sedated  Airway & Oxygen Therapy: Patient Spontanous Breathing and Patient connected to face mask oxygen  Post-op Assessment: Report given to RN and Post -op Vital signs reviewed and stable  Post vital signs: Reviewed and stable  Last Vitals:  Vitals Value Taken Time  BP 134/75 07/20/22 1355  Temp    Pulse 65 07/20/22 1356  Resp 11 07/20/22 1356  SpO2 100 % 07/20/22 1356  Vitals shown include unvalidated device data.  Last Pain:  Vitals:   07/20/22 0941  TempSrc:   PainSc: 0-No pain         Complications: No notable events documented.

## 2022-07-21 ENCOUNTER — Encounter (HOSPITAL_COMMUNITY): Payer: Self-pay | Admitting: Urology

## 2022-07-21 DIAGNOSIS — M6281 Muscle weakness (generalized): Secondary | ICD-10-CM | POA: Diagnosis not present

## 2022-07-21 DIAGNOSIS — I69828 Other speech and language deficits following other cerebrovascular disease: Secondary | ICD-10-CM | POA: Diagnosis not present

## 2022-07-21 DIAGNOSIS — I69891 Dysphagia following other cerebrovascular disease: Secondary | ICD-10-CM | POA: Diagnosis not present

## 2022-07-21 DIAGNOSIS — I69322 Dysarthria following cerebral infarction: Secondary | ICD-10-CM | POA: Diagnosis not present

## 2022-07-21 DIAGNOSIS — N390 Urinary tract infection, site not specified: Secondary | ICD-10-CM | POA: Diagnosis not present

## 2022-07-21 DIAGNOSIS — R2681 Unsteadiness on feet: Secondary | ICD-10-CM | POA: Diagnosis not present

## 2022-07-21 NOTE — Anesthesia Postprocedure Evaluation (Signed)
Anesthesia Post Note  Patient: Arthur [name]  Procedure(s) Performed: CYSTOSCOPY, LEFT URETEROSCOPY, HOLMIUM LASER, STENT PLACEMENT (Left)     Patient location during evaluation: PACU Anesthesia Type: General Level of consciousness: awake and alert Pain management: pain level controlled Vital Signs Assessment: post-procedure vital signs reviewed and stable Respiratory status: spontaneous breathing, nonlabored ventilation, respiratory function stable and patient connected to nasal cannula oxygen Cardiovascular status: blood pressure returned to baseline and stable Postop Assessment: no apparent nausea or vomiting Anesthetic complications: no   No notable events documented.  Last Vitals:  Vitals:   07/20/22 1500 07/20/22 1515  BP: (!) 144/97 134/67  Pulse: 74 91  Resp: 15 16  Temp:    SpO2: 94% 94%    Last Pain:  Vitals:   07/20/22 1515  TempSrc:   PainSc: 0-No pain                 Wally Shevchenko S

## 2022-07-22 DIAGNOSIS — I69828 Other speech and language deficits following other cerebrovascular disease: Secondary | ICD-10-CM | POA: Diagnosis not present

## 2022-07-22 DIAGNOSIS — M6281 Muscle weakness (generalized): Secondary | ICD-10-CM | POA: Diagnosis not present

## 2022-07-22 DIAGNOSIS — E785 Hyperlipidemia, unspecified: Secondary | ICD-10-CM | POA: Diagnosis not present

## 2022-07-22 DIAGNOSIS — I69891 Dysphagia following other cerebrovascular disease: Secondary | ICD-10-CM | POA: Diagnosis not present

## 2022-07-22 DIAGNOSIS — N390 Urinary tract infection, site not specified: Secondary | ICD-10-CM | POA: Diagnosis not present

## 2022-07-22 DIAGNOSIS — I69322 Dysarthria following cerebral infarction: Secondary | ICD-10-CM | POA: Diagnosis not present

## 2022-07-22 DIAGNOSIS — N1832 Chronic kidney disease, stage 3b: Secondary | ICD-10-CM | POA: Diagnosis not present

## 2022-07-22 DIAGNOSIS — I2510 Atherosclerotic heart disease of native coronary artery without angina pectoris: Secondary | ICD-10-CM | POA: Diagnosis not present

## 2022-07-22 DIAGNOSIS — B9620 Unspecified Escherichia coli [E. coli] as the cause of diseases classified elsewhere: Secondary | ICD-10-CM | POA: Diagnosis not present

## 2022-07-22 DIAGNOSIS — I69354 Hemiplegia and hemiparesis following cerebral infarction affecting left non-dominant side: Secondary | ICD-10-CM | POA: Diagnosis not present

## 2022-07-22 DIAGNOSIS — N202 Calculus of kidney with calculus of ureter: Secondary | ICD-10-CM | POA: Diagnosis not present

## 2022-07-22 DIAGNOSIS — I129 Hypertensive chronic kidney disease with stage 1 through stage 4 chronic kidney disease, or unspecified chronic kidney disease: Secondary | ICD-10-CM | POA: Diagnosis not present

## 2022-07-22 DIAGNOSIS — I4819 Other persistent atrial fibrillation: Secondary | ICD-10-CM | POA: Diagnosis not present

## 2022-07-22 DIAGNOSIS — R2681 Unsteadiness on feet: Secondary | ICD-10-CM | POA: Diagnosis not present

## 2022-07-23 DIAGNOSIS — N202 Calculus of kidney with calculus of ureter: Secondary | ICD-10-CM | POA: Diagnosis not present

## 2022-07-23 DIAGNOSIS — E785 Hyperlipidemia, unspecified: Secondary | ICD-10-CM | POA: Diagnosis not present

## 2022-07-23 DIAGNOSIS — I480 Paroxysmal atrial fibrillation: Secondary | ICD-10-CM | POA: Diagnosis not present

## 2022-07-23 DIAGNOSIS — I10 Essential (primary) hypertension: Secondary | ICD-10-CM | POA: Diagnosis not present

## 2022-07-24 DIAGNOSIS — Z7401 Bed confinement status: Secondary | ICD-10-CM | POA: Diagnosis not present

## 2022-07-24 DIAGNOSIS — Z79891 Long term (current) use of opiate analgesic: Secondary | ICD-10-CM | POA: Diagnosis not present

## 2022-07-24 DIAGNOSIS — N390 Urinary tract infection, site not specified: Secondary | ICD-10-CM | POA: Diagnosis not present

## 2022-07-24 DIAGNOSIS — Z7901 Long term (current) use of anticoagulants: Secondary | ICD-10-CM | POA: Diagnosis not present

## 2022-07-24 DIAGNOSIS — I69354 Hemiplegia and hemiparesis following cerebral infarction affecting left non-dominant side: Secondary | ICD-10-CM | POA: Diagnosis not present

## 2022-07-24 DIAGNOSIS — Z9181 History of falling: Secondary | ICD-10-CM | POA: Diagnosis not present

## 2022-07-24 DIAGNOSIS — K219 Gastro-esophageal reflux disease without esophagitis: Secondary | ICD-10-CM | POA: Diagnosis not present

## 2022-07-24 DIAGNOSIS — N1832 Chronic kidney disease, stage 3b: Secondary | ICD-10-CM | POA: Diagnosis not present

## 2022-07-24 DIAGNOSIS — I129 Hypertensive chronic kidney disease with stage 1 through stage 4 chronic kidney disease, or unspecified chronic kidney disease: Secondary | ICD-10-CM | POA: Diagnosis not present

## 2022-07-24 DIAGNOSIS — E785 Hyperlipidemia, unspecified: Secondary | ICD-10-CM | POA: Diagnosis not present

## 2022-07-24 DIAGNOSIS — Z791 Long term (current) use of non-steroidal anti-inflammatories (NSAID): Secondary | ICD-10-CM | POA: Diagnosis not present

## 2022-07-24 DIAGNOSIS — I4819 Other persistent atrial fibrillation: Secondary | ICD-10-CM | POA: Diagnosis not present

## 2022-07-24 DIAGNOSIS — B9620 Unspecified Escherichia coli [E. coli] as the cause of diseases classified elsewhere: Secondary | ICD-10-CM | POA: Diagnosis not present

## 2022-07-24 DIAGNOSIS — N202 Calculus of kidney with calculus of ureter: Secondary | ICD-10-CM | POA: Diagnosis not present

## 2022-07-24 DIAGNOSIS — I2510 Atherosclerotic heart disease of native coronary artery without angina pectoris: Secondary | ICD-10-CM | POA: Diagnosis not present

## 2022-07-24 DIAGNOSIS — M1990 Unspecified osteoarthritis, unspecified site: Secondary | ICD-10-CM | POA: Diagnosis not present

## 2022-08-04 DIAGNOSIS — I69354 Hemiplegia and hemiparesis following cerebral infarction affecting left non-dominant side: Secondary | ICD-10-CM | POA: Diagnosis not present

## 2022-08-04 DIAGNOSIS — M1990 Unspecified osteoarthritis, unspecified site: Secondary | ICD-10-CM | POA: Diagnosis not present

## 2022-08-04 DIAGNOSIS — N202 Calculus of kidney with calculus of ureter: Secondary | ICD-10-CM | POA: Diagnosis not present

## 2022-08-04 DIAGNOSIS — B9620 Unspecified Escherichia coli [E. coli] as the cause of diseases classified elsewhere: Secondary | ICD-10-CM | POA: Diagnosis not present

## 2022-08-04 DIAGNOSIS — N390 Urinary tract infection, site not specified: Secondary | ICD-10-CM | POA: Diagnosis not present

## 2022-08-04 DIAGNOSIS — I4819 Other persistent atrial fibrillation: Secondary | ICD-10-CM | POA: Diagnosis not present

## 2022-08-06 DIAGNOSIS — I4819 Other persistent atrial fibrillation: Secondary | ICD-10-CM | POA: Diagnosis not present

## 2022-08-06 DIAGNOSIS — M1990 Unspecified osteoarthritis, unspecified site: Secondary | ICD-10-CM | POA: Diagnosis not present

## 2022-08-06 DIAGNOSIS — I69354 Hemiplegia and hemiparesis following cerebral infarction affecting left non-dominant side: Secondary | ICD-10-CM | POA: Diagnosis not present

## 2022-08-06 DIAGNOSIS — N202 Calculus of kidney with calculus of ureter: Secondary | ICD-10-CM | POA: Diagnosis not present

## 2022-08-06 DIAGNOSIS — N390 Urinary tract infection, site not specified: Secondary | ICD-10-CM | POA: Diagnosis not present

## 2022-08-06 DIAGNOSIS — B9620 Unspecified Escherichia coli [E. coli] as the cause of diseases classified elsewhere: Secondary | ICD-10-CM | POA: Diagnosis not present

## 2022-08-09 DIAGNOSIS — N202 Calculus of kidney with calculus of ureter: Secondary | ICD-10-CM | POA: Diagnosis not present

## 2022-08-09 DIAGNOSIS — B9620 Unspecified Escherichia coli [E. coli] as the cause of diseases classified elsewhere: Secondary | ICD-10-CM | POA: Diagnosis not present

## 2022-08-09 DIAGNOSIS — N390 Urinary tract infection, site not specified: Secondary | ICD-10-CM | POA: Diagnosis not present

## 2022-08-09 DIAGNOSIS — I69354 Hemiplegia and hemiparesis following cerebral infarction affecting left non-dominant side: Secondary | ICD-10-CM | POA: Diagnosis not present

## 2022-08-09 DIAGNOSIS — I4819 Other persistent atrial fibrillation: Secondary | ICD-10-CM | POA: Diagnosis not present

## 2022-08-09 DIAGNOSIS — M1990 Unspecified osteoarthritis, unspecified site: Secondary | ICD-10-CM | POA: Diagnosis not present

## 2022-08-11 DIAGNOSIS — D6869 Other thrombophilia: Secondary | ICD-10-CM | POA: Diagnosis not present

## 2022-08-11 DIAGNOSIS — R059 Cough, unspecified: Secondary | ICD-10-CM | POA: Diagnosis not present

## 2022-08-11 DIAGNOSIS — Z09 Encounter for follow-up examination after completed treatment for conditions other than malignant neoplasm: Secondary | ICD-10-CM | POA: Diagnosis not present

## 2022-08-11 DIAGNOSIS — M25561 Pain in right knee: Secondary | ICD-10-CM | POA: Diagnosis not present

## 2022-08-11 DIAGNOSIS — G8114 Spastic hemiplegia affecting left nondominant side: Secondary | ICD-10-CM | POA: Diagnosis not present

## 2022-08-11 DIAGNOSIS — N1831 Chronic kidney disease, stage 3a: Secondary | ICD-10-CM | POA: Diagnosis not present

## 2022-08-12 DIAGNOSIS — N202 Calculus of kidney with calculus of ureter: Secondary | ICD-10-CM | POA: Diagnosis not present

## 2022-08-12 DIAGNOSIS — N390 Urinary tract infection, site not specified: Secondary | ICD-10-CM | POA: Diagnosis not present

## 2022-08-12 DIAGNOSIS — I69354 Hemiplegia and hemiparesis following cerebral infarction affecting left non-dominant side: Secondary | ICD-10-CM | POA: Diagnosis not present

## 2022-08-12 DIAGNOSIS — I4819 Other persistent atrial fibrillation: Secondary | ICD-10-CM | POA: Diagnosis not present

## 2022-08-12 DIAGNOSIS — M1990 Unspecified osteoarthritis, unspecified site: Secondary | ICD-10-CM | POA: Diagnosis not present

## 2022-08-12 DIAGNOSIS — B9620 Unspecified Escherichia coli [E. coli] as the cause of diseases classified elsewhere: Secondary | ICD-10-CM | POA: Diagnosis not present

## 2022-08-17 DIAGNOSIS — I4819 Other persistent atrial fibrillation: Secondary | ICD-10-CM | POA: Diagnosis not present

## 2022-08-17 DIAGNOSIS — M1990 Unspecified osteoarthritis, unspecified site: Secondary | ICD-10-CM | POA: Diagnosis not present

## 2022-08-17 DIAGNOSIS — I69354 Hemiplegia and hemiparesis following cerebral infarction affecting left non-dominant side: Secondary | ICD-10-CM | POA: Diagnosis not present

## 2022-08-17 DIAGNOSIS — B9620 Unspecified Escherichia coli [E. coli] as the cause of diseases classified elsewhere: Secondary | ICD-10-CM | POA: Diagnosis not present

## 2022-08-17 DIAGNOSIS — N390 Urinary tract infection, site not specified: Secondary | ICD-10-CM | POA: Diagnosis not present

## 2022-08-17 DIAGNOSIS — N202 Calculus of kidney with calculus of ureter: Secondary | ICD-10-CM | POA: Diagnosis not present

## 2022-08-23 DIAGNOSIS — N202 Calculus of kidney with calculus of ureter: Secondary | ICD-10-CM | POA: Diagnosis not present

## 2022-08-23 DIAGNOSIS — M1990 Unspecified osteoarthritis, unspecified site: Secondary | ICD-10-CM | POA: Diagnosis not present

## 2022-08-23 DIAGNOSIS — I4819 Other persistent atrial fibrillation: Secondary | ICD-10-CM | POA: Diagnosis not present

## 2022-08-23 DIAGNOSIS — N1832 Chronic kidney disease, stage 3b: Secondary | ICD-10-CM | POA: Diagnosis not present

## 2022-08-23 DIAGNOSIS — I2510 Atherosclerotic heart disease of native coronary artery without angina pectoris: Secondary | ICD-10-CM | POA: Diagnosis not present

## 2022-08-23 DIAGNOSIS — Z791 Long term (current) use of non-steroidal anti-inflammatories (NSAID): Secondary | ICD-10-CM | POA: Diagnosis not present

## 2022-08-23 DIAGNOSIS — Z79891 Long term (current) use of opiate analgesic: Secondary | ICD-10-CM | POA: Diagnosis not present

## 2022-08-23 DIAGNOSIS — Z7401 Bed confinement status: Secondary | ICD-10-CM | POA: Diagnosis not present

## 2022-08-23 DIAGNOSIS — K219 Gastro-esophageal reflux disease without esophagitis: Secondary | ICD-10-CM | POA: Diagnosis not present

## 2022-08-23 DIAGNOSIS — Z9181 History of falling: Secondary | ICD-10-CM | POA: Diagnosis not present

## 2022-08-23 DIAGNOSIS — B9620 Unspecified Escherichia coli [E. coli] as the cause of diseases classified elsewhere: Secondary | ICD-10-CM | POA: Diagnosis not present

## 2022-08-23 DIAGNOSIS — Z7901 Long term (current) use of anticoagulants: Secondary | ICD-10-CM | POA: Diagnosis not present

## 2022-08-23 DIAGNOSIS — N390 Urinary tract infection, site not specified: Secondary | ICD-10-CM | POA: Diagnosis not present

## 2022-08-23 DIAGNOSIS — I129 Hypertensive chronic kidney disease with stage 1 through stage 4 chronic kidney disease, or unspecified chronic kidney disease: Secondary | ICD-10-CM | POA: Diagnosis not present

## 2022-08-23 DIAGNOSIS — I69354 Hemiplegia and hemiparesis following cerebral infarction affecting left non-dominant side: Secondary | ICD-10-CM | POA: Diagnosis not present

## 2022-08-23 DIAGNOSIS — E785 Hyperlipidemia, unspecified: Secondary | ICD-10-CM | POA: Diagnosis not present

## 2022-08-25 DIAGNOSIS — M25561 Pain in right knee: Secondary | ICD-10-CM | POA: Diagnosis not present

## 2022-08-26 DIAGNOSIS — N202 Calculus of kidney with calculus of ureter: Secondary | ICD-10-CM | POA: Diagnosis not present

## 2022-08-26 DIAGNOSIS — I4819 Other persistent atrial fibrillation: Secondary | ICD-10-CM | POA: Diagnosis not present

## 2022-08-26 DIAGNOSIS — M1990 Unspecified osteoarthritis, unspecified site: Secondary | ICD-10-CM | POA: Diagnosis not present

## 2022-08-26 DIAGNOSIS — N390 Urinary tract infection, site not specified: Secondary | ICD-10-CM | POA: Diagnosis not present

## 2022-08-26 DIAGNOSIS — B9620 Unspecified Escherichia coli [E. coli] as the cause of diseases classified elsewhere: Secondary | ICD-10-CM | POA: Diagnosis not present

## 2022-08-26 DIAGNOSIS — I69354 Hemiplegia and hemiparesis following cerebral infarction affecting left non-dominant side: Secondary | ICD-10-CM | POA: Diagnosis not present

## 2022-09-01 ENCOUNTER — Other Ambulatory Visit: Payer: Self-pay | Admitting: Cardiology

## 2022-09-01 DIAGNOSIS — I480 Paroxysmal atrial fibrillation: Secondary | ICD-10-CM

## 2022-09-01 NOTE — Telephone Encounter (Signed)
Pt last saw Dr Martinique 05/31/22, last labs 07/16/22 Creat 1.38, age 89, weight 78.5mg , based on specified criteria pt is on appropriate dosage of Eliquis 5mg  BID for left atrial appendage thrombus.  Will refill rx.

## 2022-09-02 DIAGNOSIS — I69354 Hemiplegia and hemiparesis following cerebral infarction affecting left non-dominant side: Secondary | ICD-10-CM | POA: Diagnosis not present

## 2022-09-02 DIAGNOSIS — N202 Calculus of kidney with calculus of ureter: Secondary | ICD-10-CM | POA: Diagnosis not present

## 2022-09-02 DIAGNOSIS — B9620 Unspecified Escherichia coli [E. coli] as the cause of diseases classified elsewhere: Secondary | ICD-10-CM | POA: Diagnosis not present

## 2022-09-02 DIAGNOSIS — I4819 Other persistent atrial fibrillation: Secondary | ICD-10-CM | POA: Diagnosis not present

## 2022-09-02 DIAGNOSIS — N390 Urinary tract infection, site not specified: Secondary | ICD-10-CM | POA: Diagnosis not present

## 2022-09-02 DIAGNOSIS — M1990 Unspecified osteoarthritis, unspecified site: Secondary | ICD-10-CM | POA: Diagnosis not present

## 2022-09-03 DIAGNOSIS — I69354 Hemiplegia and hemiparesis following cerebral infarction affecting left non-dominant side: Secondary | ICD-10-CM | POA: Diagnosis not present

## 2022-09-03 DIAGNOSIS — B9620 Unspecified Escherichia coli [E. coli] as the cause of diseases classified elsewhere: Secondary | ICD-10-CM | POA: Diagnosis not present

## 2022-09-03 DIAGNOSIS — N202 Calculus of kidney with calculus of ureter: Secondary | ICD-10-CM | POA: Diagnosis not present

## 2022-09-03 DIAGNOSIS — I4819 Other persistent atrial fibrillation: Secondary | ICD-10-CM | POA: Diagnosis not present

## 2022-09-03 DIAGNOSIS — M1990 Unspecified osteoarthritis, unspecified site: Secondary | ICD-10-CM | POA: Diagnosis not present

## 2022-09-03 DIAGNOSIS — N390 Urinary tract infection, site not specified: Secondary | ICD-10-CM | POA: Diagnosis not present

## 2022-09-09 DIAGNOSIS — N202 Calculus of kidney with calculus of ureter: Secondary | ICD-10-CM | POA: Diagnosis not present

## 2022-09-09 DIAGNOSIS — M1990 Unspecified osteoarthritis, unspecified site: Secondary | ICD-10-CM | POA: Diagnosis not present

## 2022-09-09 DIAGNOSIS — N390 Urinary tract infection, site not specified: Secondary | ICD-10-CM | POA: Diagnosis not present

## 2022-09-09 DIAGNOSIS — I69354 Hemiplegia and hemiparesis following cerebral infarction affecting left non-dominant side: Secondary | ICD-10-CM | POA: Diagnosis not present

## 2022-09-09 DIAGNOSIS — B9620 Unspecified Escherichia coli [E. coli] as the cause of diseases classified elsewhere: Secondary | ICD-10-CM | POA: Diagnosis not present

## 2022-09-09 DIAGNOSIS — I4819 Other persistent atrial fibrillation: Secondary | ICD-10-CM | POA: Diagnosis not present

## 2022-09-10 DIAGNOSIS — I4819 Other persistent atrial fibrillation: Secondary | ICD-10-CM | POA: Diagnosis not present

## 2022-09-10 DIAGNOSIS — N390 Urinary tract infection, site not specified: Secondary | ICD-10-CM | POA: Diagnosis not present

## 2022-09-10 DIAGNOSIS — N202 Calculus of kidney with calculus of ureter: Secondary | ICD-10-CM | POA: Diagnosis not present

## 2022-09-10 DIAGNOSIS — B9620 Unspecified Escherichia coli [E. coli] as the cause of diseases classified elsewhere: Secondary | ICD-10-CM | POA: Diagnosis not present

## 2022-09-10 DIAGNOSIS — M1990 Unspecified osteoarthritis, unspecified site: Secondary | ICD-10-CM | POA: Diagnosis not present

## 2022-09-10 DIAGNOSIS — I69354 Hemiplegia and hemiparesis following cerebral infarction affecting left non-dominant side: Secondary | ICD-10-CM | POA: Diagnosis not present

## 2022-09-13 ENCOUNTER — Other Ambulatory Visit: Payer: Self-pay | Admitting: Cardiology

## 2022-09-17 DIAGNOSIS — I4819 Other persistent atrial fibrillation: Secondary | ICD-10-CM | POA: Diagnosis not present

## 2022-09-17 DIAGNOSIS — I69354 Hemiplegia and hemiparesis following cerebral infarction affecting left non-dominant side: Secondary | ICD-10-CM | POA: Diagnosis not present

## 2022-09-17 DIAGNOSIS — M1990 Unspecified osteoarthritis, unspecified site: Secondary | ICD-10-CM | POA: Diagnosis not present

## 2022-09-17 DIAGNOSIS — B9620 Unspecified Escherichia coli [E. coli] as the cause of diseases classified elsewhere: Secondary | ICD-10-CM | POA: Diagnosis not present

## 2022-09-17 DIAGNOSIS — N390 Urinary tract infection, site not specified: Secondary | ICD-10-CM | POA: Diagnosis not present

## 2022-09-17 DIAGNOSIS — N202 Calculus of kidney with calculus of ureter: Secondary | ICD-10-CM | POA: Diagnosis not present

## 2022-09-20 DIAGNOSIS — B9620 Unspecified Escherichia coli [E. coli] as the cause of diseases classified elsewhere: Secondary | ICD-10-CM | POA: Diagnosis not present

## 2022-09-20 DIAGNOSIS — I4819 Other persistent atrial fibrillation: Secondary | ICD-10-CM | POA: Diagnosis not present

## 2022-09-20 DIAGNOSIS — M1990 Unspecified osteoarthritis, unspecified site: Secondary | ICD-10-CM | POA: Diagnosis not present

## 2022-09-20 DIAGNOSIS — I69354 Hemiplegia and hemiparesis following cerebral infarction affecting left non-dominant side: Secondary | ICD-10-CM | POA: Diagnosis not present

## 2022-09-20 DIAGNOSIS — N202 Calculus of kidney with calculus of ureter: Secondary | ICD-10-CM | POA: Diagnosis not present

## 2022-09-20 DIAGNOSIS — N390 Urinary tract infection, site not specified: Secondary | ICD-10-CM | POA: Diagnosis not present

## 2022-09-22 DIAGNOSIS — Z955 Presence of coronary angioplasty implant and graft: Secondary | ICD-10-CM | POA: Diagnosis not present

## 2022-09-22 DIAGNOSIS — Z87440 Personal history of urinary (tract) infections: Secondary | ICD-10-CM | POA: Diagnosis not present

## 2022-09-22 DIAGNOSIS — Z951 Presence of aortocoronary bypass graft: Secondary | ICD-10-CM | POA: Diagnosis not present

## 2022-09-22 DIAGNOSIS — I129 Hypertensive chronic kidney disease with stage 1 through stage 4 chronic kidney disease, or unspecified chronic kidney disease: Secondary | ICD-10-CM | POA: Diagnosis not present

## 2022-09-22 DIAGNOSIS — I69354 Hemiplegia and hemiparesis following cerebral infarction affecting left non-dominant side: Secondary | ICD-10-CM | POA: Diagnosis not present

## 2022-09-22 DIAGNOSIS — Z7901 Long term (current) use of anticoagulants: Secondary | ICD-10-CM | POA: Diagnosis not present

## 2022-09-22 DIAGNOSIS — N1832 Chronic kidney disease, stage 3b: Secondary | ICD-10-CM | POA: Diagnosis not present

## 2022-09-22 DIAGNOSIS — K2100 Gastro-esophageal reflux disease with esophagitis, without bleeding: Secondary | ICD-10-CM | POA: Diagnosis not present

## 2022-09-22 DIAGNOSIS — I700 Atherosclerosis of aorta: Secondary | ICD-10-CM | POA: Diagnosis not present

## 2022-09-22 DIAGNOSIS — N400 Enlarged prostate without lower urinary tract symptoms: Secondary | ICD-10-CM | POA: Diagnosis not present

## 2022-09-22 DIAGNOSIS — N202 Calculus of kidney with calculus of ureter: Secondary | ICD-10-CM | POA: Diagnosis not present

## 2022-09-22 DIAGNOSIS — M1990 Unspecified osteoarthritis, unspecified site: Secondary | ICD-10-CM | POA: Diagnosis not present

## 2022-09-22 DIAGNOSIS — I2510 Atherosclerotic heart disease of native coronary artery without angina pectoris: Secondary | ICD-10-CM | POA: Diagnosis not present

## 2022-09-22 DIAGNOSIS — Z87891 Personal history of nicotine dependence: Secondary | ICD-10-CM | POA: Diagnosis not present

## 2022-09-22 DIAGNOSIS — E785 Hyperlipidemia, unspecified: Secondary | ICD-10-CM | POA: Diagnosis not present

## 2022-09-22 DIAGNOSIS — I4819 Other persistent atrial fibrillation: Secondary | ICD-10-CM | POA: Diagnosis not present

## 2022-09-22 DIAGNOSIS — K5730 Diverticulosis of large intestine without perforation or abscess without bleeding: Secondary | ICD-10-CM | POA: Diagnosis not present

## 2022-09-22 DIAGNOSIS — Z993 Dependence on wheelchair: Secondary | ICD-10-CM | POA: Diagnosis not present

## 2022-09-23 DIAGNOSIS — I4819 Other persistent atrial fibrillation: Secondary | ICD-10-CM | POA: Diagnosis not present

## 2022-09-23 DIAGNOSIS — I2510 Atherosclerotic heart disease of native coronary artery without angina pectoris: Secondary | ICD-10-CM | POA: Diagnosis not present

## 2022-09-23 DIAGNOSIS — I700 Atherosclerosis of aorta: Secondary | ICD-10-CM | POA: Diagnosis not present

## 2022-09-23 DIAGNOSIS — I69354 Hemiplegia and hemiparesis following cerebral infarction affecting left non-dominant side: Secondary | ICD-10-CM | POA: Diagnosis not present

## 2022-09-23 DIAGNOSIS — I129 Hypertensive chronic kidney disease with stage 1 through stage 4 chronic kidney disease, or unspecified chronic kidney disease: Secondary | ICD-10-CM | POA: Diagnosis not present

## 2022-09-23 DIAGNOSIS — N1832 Chronic kidney disease, stage 3b: Secondary | ICD-10-CM | POA: Diagnosis not present

## 2022-09-29 DIAGNOSIS — N1832 Chronic kidney disease, stage 3b: Secondary | ICD-10-CM | POA: Diagnosis not present

## 2022-09-29 DIAGNOSIS — I129 Hypertensive chronic kidney disease with stage 1 through stage 4 chronic kidney disease, or unspecified chronic kidney disease: Secondary | ICD-10-CM | POA: Diagnosis not present

## 2022-09-29 DIAGNOSIS — I700 Atherosclerosis of aorta: Secondary | ICD-10-CM | POA: Diagnosis not present

## 2022-09-29 DIAGNOSIS — I2510 Atherosclerotic heart disease of native coronary artery without angina pectoris: Secondary | ICD-10-CM | POA: Diagnosis not present

## 2022-09-29 DIAGNOSIS — I69354 Hemiplegia and hemiparesis following cerebral infarction affecting left non-dominant side: Secondary | ICD-10-CM | POA: Diagnosis not present

## 2022-09-29 DIAGNOSIS — I4819 Other persistent atrial fibrillation: Secondary | ICD-10-CM | POA: Diagnosis not present

## 2022-09-30 DIAGNOSIS — N202 Calculus of kidney with calculus of ureter: Secondary | ICD-10-CM | POA: Diagnosis not present

## 2022-10-01 DIAGNOSIS — I2510 Atherosclerotic heart disease of native coronary artery without angina pectoris: Secondary | ICD-10-CM | POA: Diagnosis not present

## 2022-10-01 DIAGNOSIS — N1832 Chronic kidney disease, stage 3b: Secondary | ICD-10-CM | POA: Diagnosis not present

## 2022-10-01 DIAGNOSIS — I4819 Other persistent atrial fibrillation: Secondary | ICD-10-CM | POA: Diagnosis not present

## 2022-10-01 DIAGNOSIS — I129 Hypertensive chronic kidney disease with stage 1 through stage 4 chronic kidney disease, or unspecified chronic kidney disease: Secondary | ICD-10-CM | POA: Diagnosis not present

## 2022-10-01 DIAGNOSIS — I700 Atherosclerosis of aorta: Secondary | ICD-10-CM | POA: Diagnosis not present

## 2022-10-01 DIAGNOSIS — I69354 Hemiplegia and hemiparesis following cerebral infarction affecting left non-dominant side: Secondary | ICD-10-CM | POA: Diagnosis not present

## 2022-10-07 DIAGNOSIS — I059 Rheumatic mitral valve disease, unspecified: Secondary | ICD-10-CM | POA: Diagnosis not present

## 2022-10-07 DIAGNOSIS — E785 Hyperlipidemia, unspecified: Secondary | ICD-10-CM | POA: Diagnosis not present

## 2022-10-07 DIAGNOSIS — J9691 Respiratory failure, unspecified with hypoxia: Secondary | ICD-10-CM | POA: Diagnosis not present

## 2022-10-07 DIAGNOSIS — I4891 Unspecified atrial fibrillation: Secondary | ICD-10-CM | POA: Diagnosis not present

## 2022-10-07 DIAGNOSIS — I69354 Hemiplegia and hemiparesis following cerebral infarction affecting left non-dominant side: Secondary | ICD-10-CM | POA: Diagnosis not present

## 2022-10-07 DIAGNOSIS — N183 Chronic kidney disease, stage 3 unspecified: Secondary | ICD-10-CM | POA: Diagnosis not present

## 2022-10-07 DIAGNOSIS — K219 Gastro-esophageal reflux disease without esophagitis: Secondary | ICD-10-CM | POA: Diagnosis not present

## 2022-10-07 DIAGNOSIS — I2510 Atherosclerotic heart disease of native coronary artery without angina pectoris: Secondary | ICD-10-CM | POA: Diagnosis not present

## 2022-10-07 DIAGNOSIS — I69318 Other symptoms and signs involving cognitive functions following cerebral infarction: Secondary | ICD-10-CM | POA: Diagnosis not present

## 2022-10-08 DIAGNOSIS — I059 Rheumatic mitral valve disease, unspecified: Secondary | ICD-10-CM | POA: Diagnosis not present

## 2022-10-08 DIAGNOSIS — E785 Hyperlipidemia, unspecified: Secondary | ICD-10-CM | POA: Diagnosis not present

## 2022-10-08 DIAGNOSIS — I69318 Other symptoms and signs involving cognitive functions following cerebral infarction: Secondary | ICD-10-CM | POA: Diagnosis not present

## 2022-10-08 DIAGNOSIS — J9691 Respiratory failure, unspecified with hypoxia: Secondary | ICD-10-CM | POA: Diagnosis not present

## 2022-10-08 DIAGNOSIS — I2510 Atherosclerotic heart disease of native coronary artery without angina pectoris: Secondary | ICD-10-CM | POA: Diagnosis not present

## 2022-10-08 DIAGNOSIS — I69354 Hemiplegia and hemiparesis following cerebral infarction affecting left non-dominant side: Secondary | ICD-10-CM | POA: Diagnosis not present

## 2022-10-09 DIAGNOSIS — I69318 Other symptoms and signs involving cognitive functions following cerebral infarction: Secondary | ICD-10-CM | POA: Diagnosis not present

## 2022-10-09 DIAGNOSIS — I059 Rheumatic mitral valve disease, unspecified: Secondary | ICD-10-CM | POA: Diagnosis not present

## 2022-10-09 DIAGNOSIS — I69354 Hemiplegia and hemiparesis following cerebral infarction affecting left non-dominant side: Secondary | ICD-10-CM | POA: Diagnosis not present

## 2022-10-09 DIAGNOSIS — J9691 Respiratory failure, unspecified with hypoxia: Secondary | ICD-10-CM | POA: Diagnosis not present

## 2022-10-09 DIAGNOSIS — E785 Hyperlipidemia, unspecified: Secondary | ICD-10-CM | POA: Diagnosis not present

## 2022-10-09 DIAGNOSIS — I2510 Atherosclerotic heart disease of native coronary artery without angina pectoris: Secondary | ICD-10-CM | POA: Diagnosis not present

## 2022-10-10 DIAGNOSIS — I2510 Atherosclerotic heart disease of native coronary artery without angina pectoris: Secondary | ICD-10-CM | POA: Diagnosis not present

## 2022-10-10 DIAGNOSIS — J9691 Respiratory failure, unspecified with hypoxia: Secondary | ICD-10-CM | POA: Diagnosis not present

## 2022-10-10 DIAGNOSIS — I059 Rheumatic mitral valve disease, unspecified: Secondary | ICD-10-CM | POA: Diagnosis not present

## 2022-10-10 DIAGNOSIS — E785 Hyperlipidemia, unspecified: Secondary | ICD-10-CM | POA: Diagnosis not present

## 2022-10-10 DIAGNOSIS — I69354 Hemiplegia and hemiparesis following cerebral infarction affecting left non-dominant side: Secondary | ICD-10-CM | POA: Diagnosis not present

## 2022-10-10 DIAGNOSIS — I69318 Other symptoms and signs involving cognitive functions following cerebral infarction: Secondary | ICD-10-CM | POA: Diagnosis not present

## 2022-10-11 DIAGNOSIS — I2510 Atherosclerotic heart disease of native coronary artery without angina pectoris: Secondary | ICD-10-CM | POA: Diagnosis not present

## 2022-10-11 DIAGNOSIS — I059 Rheumatic mitral valve disease, unspecified: Secondary | ICD-10-CM | POA: Diagnosis not present

## 2022-10-11 DIAGNOSIS — I69318 Other symptoms and signs involving cognitive functions following cerebral infarction: Secondary | ICD-10-CM | POA: Diagnosis not present

## 2022-10-11 DIAGNOSIS — E785 Hyperlipidemia, unspecified: Secondary | ICD-10-CM | POA: Diagnosis not present

## 2022-10-11 DIAGNOSIS — J9691 Respiratory failure, unspecified with hypoxia: Secondary | ICD-10-CM | POA: Diagnosis not present

## 2022-10-11 DIAGNOSIS — I69354 Hemiplegia and hemiparesis following cerebral infarction affecting left non-dominant side: Secondary | ICD-10-CM | POA: Diagnosis not present

## 2022-10-12 DIAGNOSIS — I69318 Other symptoms and signs involving cognitive functions following cerebral infarction: Secondary | ICD-10-CM | POA: Diagnosis not present

## 2022-10-12 DIAGNOSIS — J9691 Respiratory failure, unspecified with hypoxia: Secondary | ICD-10-CM | POA: Diagnosis not present

## 2022-10-12 DIAGNOSIS — I2510 Atherosclerotic heart disease of native coronary artery without angina pectoris: Secondary | ICD-10-CM | POA: Diagnosis not present

## 2022-10-12 DIAGNOSIS — I059 Rheumatic mitral valve disease, unspecified: Secondary | ICD-10-CM | POA: Diagnosis not present

## 2022-10-12 DIAGNOSIS — I69354 Hemiplegia and hemiparesis following cerebral infarction affecting left non-dominant side: Secondary | ICD-10-CM | POA: Diagnosis not present

## 2022-10-12 DIAGNOSIS — E785 Hyperlipidemia, unspecified: Secondary | ICD-10-CM | POA: Diagnosis not present

## 2022-10-13 DIAGNOSIS — J9691 Respiratory failure, unspecified with hypoxia: Secondary | ICD-10-CM | POA: Diagnosis not present

## 2022-10-13 DIAGNOSIS — E785 Hyperlipidemia, unspecified: Secondary | ICD-10-CM | POA: Diagnosis not present

## 2022-10-13 DIAGNOSIS — I059 Rheumatic mitral valve disease, unspecified: Secondary | ICD-10-CM | POA: Diagnosis not present

## 2022-10-13 DIAGNOSIS — N183 Chronic kidney disease, stage 3 unspecified: Secondary | ICD-10-CM | POA: Diagnosis not present

## 2022-10-13 DIAGNOSIS — I69354 Hemiplegia and hemiparesis following cerebral infarction affecting left non-dominant side: Secondary | ICD-10-CM | POA: Diagnosis not present

## 2022-10-13 DIAGNOSIS — I69318 Other symptoms and signs involving cognitive functions following cerebral infarction: Secondary | ICD-10-CM | POA: Diagnosis not present

## 2022-10-13 DIAGNOSIS — I2510 Atherosclerotic heart disease of native coronary artery without angina pectoris: Secondary | ICD-10-CM | POA: Diagnosis not present

## 2022-10-13 DIAGNOSIS — K219 Gastro-esophageal reflux disease without esophagitis: Secondary | ICD-10-CM | POA: Diagnosis not present

## 2022-10-13 DIAGNOSIS — I4891 Unspecified atrial fibrillation: Secondary | ICD-10-CM | POA: Diagnosis not present

## 2022-10-14 DIAGNOSIS — I69354 Hemiplegia and hemiparesis following cerebral infarction affecting left non-dominant side: Secondary | ICD-10-CM | POA: Diagnosis not present

## 2022-10-14 DIAGNOSIS — E785 Hyperlipidemia, unspecified: Secondary | ICD-10-CM | POA: Diagnosis not present

## 2022-10-14 DIAGNOSIS — I059 Rheumatic mitral valve disease, unspecified: Secondary | ICD-10-CM | POA: Diagnosis not present

## 2022-10-14 DIAGNOSIS — I2510 Atherosclerotic heart disease of native coronary artery without angina pectoris: Secondary | ICD-10-CM | POA: Diagnosis not present

## 2022-10-14 DIAGNOSIS — J9691 Respiratory failure, unspecified with hypoxia: Secondary | ICD-10-CM | POA: Diagnosis not present

## 2022-10-14 DIAGNOSIS — I69318 Other symptoms and signs involving cognitive functions following cerebral infarction: Secondary | ICD-10-CM | POA: Diagnosis not present

## 2022-10-15 DIAGNOSIS — I2510 Atherosclerotic heart disease of native coronary artery without angina pectoris: Secondary | ICD-10-CM | POA: Diagnosis not present

## 2022-10-15 DIAGNOSIS — J9691 Respiratory failure, unspecified with hypoxia: Secondary | ICD-10-CM | POA: Diagnosis not present

## 2022-10-15 DIAGNOSIS — E785 Hyperlipidemia, unspecified: Secondary | ICD-10-CM | POA: Diagnosis not present

## 2022-10-15 DIAGNOSIS — I69354 Hemiplegia and hemiparesis following cerebral infarction affecting left non-dominant side: Secondary | ICD-10-CM | POA: Diagnosis not present

## 2022-10-15 DIAGNOSIS — I69318 Other symptoms and signs involving cognitive functions following cerebral infarction: Secondary | ICD-10-CM | POA: Diagnosis not present

## 2022-10-15 DIAGNOSIS — I059 Rheumatic mitral valve disease, unspecified: Secondary | ICD-10-CM | POA: Diagnosis not present

## 2022-10-18 DIAGNOSIS — E785 Hyperlipidemia, unspecified: Secondary | ICD-10-CM | POA: Diagnosis not present

## 2022-10-18 DIAGNOSIS — J9691 Respiratory failure, unspecified with hypoxia: Secondary | ICD-10-CM | POA: Diagnosis not present

## 2022-10-18 DIAGNOSIS — I69354 Hemiplegia and hemiparesis following cerebral infarction affecting left non-dominant side: Secondary | ICD-10-CM | POA: Diagnosis not present

## 2022-10-18 DIAGNOSIS — I69318 Other symptoms and signs involving cognitive functions following cerebral infarction: Secondary | ICD-10-CM | POA: Diagnosis not present

## 2022-10-18 DIAGNOSIS — I2510 Atherosclerotic heart disease of native coronary artery without angina pectoris: Secondary | ICD-10-CM | POA: Diagnosis not present

## 2022-10-18 DIAGNOSIS — I059 Rheumatic mitral valve disease, unspecified: Secondary | ICD-10-CM | POA: Diagnosis not present

## 2022-10-19 DIAGNOSIS — E785 Hyperlipidemia, unspecified: Secondary | ICD-10-CM | POA: Diagnosis not present

## 2022-10-19 DIAGNOSIS — I69354 Hemiplegia and hemiparesis following cerebral infarction affecting left non-dominant side: Secondary | ICD-10-CM | POA: Diagnosis not present

## 2022-10-19 DIAGNOSIS — I2510 Atherosclerotic heart disease of native coronary artery without angina pectoris: Secondary | ICD-10-CM | POA: Diagnosis not present

## 2022-10-19 DIAGNOSIS — I059 Rheumatic mitral valve disease, unspecified: Secondary | ICD-10-CM | POA: Diagnosis not present

## 2022-10-19 DIAGNOSIS — J9691 Respiratory failure, unspecified with hypoxia: Secondary | ICD-10-CM | POA: Diagnosis not present

## 2022-10-19 DIAGNOSIS — I69318 Other symptoms and signs involving cognitive functions following cerebral infarction: Secondary | ICD-10-CM | POA: Diagnosis not present

## 2022-10-20 DIAGNOSIS — I69318 Other symptoms and signs involving cognitive functions following cerebral infarction: Secondary | ICD-10-CM | POA: Diagnosis not present

## 2022-10-20 DIAGNOSIS — I059 Rheumatic mitral valve disease, unspecified: Secondary | ICD-10-CM | POA: Diagnosis not present

## 2022-10-20 DIAGNOSIS — I2510 Atherosclerotic heart disease of native coronary artery without angina pectoris: Secondary | ICD-10-CM | POA: Diagnosis not present

## 2022-10-20 DIAGNOSIS — J9691 Respiratory failure, unspecified with hypoxia: Secondary | ICD-10-CM | POA: Diagnosis not present

## 2022-10-20 DIAGNOSIS — E785 Hyperlipidemia, unspecified: Secondary | ICD-10-CM | POA: Diagnosis not present

## 2022-10-20 DIAGNOSIS — I69354 Hemiplegia and hemiparesis following cerebral infarction affecting left non-dominant side: Secondary | ICD-10-CM | POA: Diagnosis not present

## 2022-10-22 DIAGNOSIS — I059 Rheumatic mitral valve disease, unspecified: Secondary | ICD-10-CM | POA: Diagnosis not present

## 2022-10-22 DIAGNOSIS — I69354 Hemiplegia and hemiparesis following cerebral infarction affecting left non-dominant side: Secondary | ICD-10-CM | POA: Diagnosis not present

## 2022-10-22 DIAGNOSIS — J9691 Respiratory failure, unspecified with hypoxia: Secondary | ICD-10-CM | POA: Diagnosis not present

## 2022-10-22 DIAGNOSIS — I69318 Other symptoms and signs involving cognitive functions following cerebral infarction: Secondary | ICD-10-CM | POA: Diagnosis not present

## 2022-10-22 DIAGNOSIS — I2510 Atherosclerotic heart disease of native coronary artery without angina pectoris: Secondary | ICD-10-CM | POA: Diagnosis not present

## 2022-10-22 DIAGNOSIS — E785 Hyperlipidemia, unspecified: Secondary | ICD-10-CM | POA: Diagnosis not present

## 2022-10-25 DIAGNOSIS — I059 Rheumatic mitral valve disease, unspecified: Secondary | ICD-10-CM | POA: Diagnosis not present

## 2022-10-25 DIAGNOSIS — I69354 Hemiplegia and hemiparesis following cerebral infarction affecting left non-dominant side: Secondary | ICD-10-CM | POA: Diagnosis not present

## 2022-10-25 DIAGNOSIS — E785 Hyperlipidemia, unspecified: Secondary | ICD-10-CM | POA: Diagnosis not present

## 2022-10-25 DIAGNOSIS — J9691 Respiratory failure, unspecified with hypoxia: Secondary | ICD-10-CM | POA: Diagnosis not present

## 2022-10-25 DIAGNOSIS — I2510 Atherosclerotic heart disease of native coronary artery without angina pectoris: Secondary | ICD-10-CM | POA: Diagnosis not present

## 2022-10-25 DIAGNOSIS — I69318 Other symptoms and signs involving cognitive functions following cerebral infarction: Secondary | ICD-10-CM | POA: Diagnosis not present

## 2022-10-27 DIAGNOSIS — J9691 Respiratory failure, unspecified with hypoxia: Secondary | ICD-10-CM | POA: Diagnosis not present

## 2022-10-27 DIAGNOSIS — E785 Hyperlipidemia, unspecified: Secondary | ICD-10-CM | POA: Diagnosis not present

## 2022-10-27 DIAGNOSIS — I69354 Hemiplegia and hemiparesis following cerebral infarction affecting left non-dominant side: Secondary | ICD-10-CM | POA: Diagnosis not present

## 2022-10-27 DIAGNOSIS — I059 Rheumatic mitral valve disease, unspecified: Secondary | ICD-10-CM | POA: Diagnosis not present

## 2022-10-27 DIAGNOSIS — I2510 Atherosclerotic heart disease of native coronary artery without angina pectoris: Secondary | ICD-10-CM | POA: Diagnosis not present

## 2022-10-27 DIAGNOSIS — I69318 Other symptoms and signs involving cognitive functions following cerebral infarction: Secondary | ICD-10-CM | POA: Diagnosis not present

## 2022-10-28 DIAGNOSIS — J9691 Respiratory failure, unspecified with hypoxia: Secondary | ICD-10-CM | POA: Diagnosis not present

## 2022-10-28 DIAGNOSIS — I059 Rheumatic mitral valve disease, unspecified: Secondary | ICD-10-CM | POA: Diagnosis not present

## 2022-10-28 DIAGNOSIS — I2510 Atherosclerotic heart disease of native coronary artery without angina pectoris: Secondary | ICD-10-CM | POA: Diagnosis not present

## 2022-10-28 DIAGNOSIS — E785 Hyperlipidemia, unspecified: Secondary | ICD-10-CM | POA: Diagnosis not present

## 2022-10-28 DIAGNOSIS — I69318 Other symptoms and signs involving cognitive functions following cerebral infarction: Secondary | ICD-10-CM | POA: Diagnosis not present

## 2022-10-28 DIAGNOSIS — I69354 Hemiplegia and hemiparesis following cerebral infarction affecting left non-dominant side: Secondary | ICD-10-CM | POA: Diagnosis not present

## 2022-10-29 DIAGNOSIS — I69318 Other symptoms and signs involving cognitive functions following cerebral infarction: Secondary | ICD-10-CM | POA: Diagnosis not present

## 2022-10-29 DIAGNOSIS — E785 Hyperlipidemia, unspecified: Secondary | ICD-10-CM | POA: Diagnosis not present

## 2022-10-29 DIAGNOSIS — J9691 Respiratory failure, unspecified with hypoxia: Secondary | ICD-10-CM | POA: Diagnosis not present

## 2022-10-29 DIAGNOSIS — I69354 Hemiplegia and hemiparesis following cerebral infarction affecting left non-dominant side: Secondary | ICD-10-CM | POA: Diagnosis not present

## 2022-10-29 DIAGNOSIS — I2510 Atherosclerotic heart disease of native coronary artery without angina pectoris: Secondary | ICD-10-CM | POA: Diagnosis not present

## 2022-10-29 DIAGNOSIS — I059 Rheumatic mitral valve disease, unspecified: Secondary | ICD-10-CM | POA: Diagnosis not present

## 2022-11-01 DIAGNOSIS — I69318 Other symptoms and signs involving cognitive functions following cerebral infarction: Secondary | ICD-10-CM | POA: Diagnosis not present

## 2022-11-01 DIAGNOSIS — J9691 Respiratory failure, unspecified with hypoxia: Secondary | ICD-10-CM | POA: Diagnosis not present

## 2022-11-01 DIAGNOSIS — I2510 Atherosclerotic heart disease of native coronary artery without angina pectoris: Secondary | ICD-10-CM | POA: Diagnosis not present

## 2022-11-01 DIAGNOSIS — E785 Hyperlipidemia, unspecified: Secondary | ICD-10-CM | POA: Diagnosis not present

## 2022-11-01 DIAGNOSIS — I69354 Hemiplegia and hemiparesis following cerebral infarction affecting left non-dominant side: Secondary | ICD-10-CM | POA: Diagnosis not present

## 2022-11-01 DIAGNOSIS — I059 Rheumatic mitral valve disease, unspecified: Secondary | ICD-10-CM | POA: Diagnosis not present

## 2022-11-02 DIAGNOSIS — I69354 Hemiplegia and hemiparesis following cerebral infarction affecting left non-dominant side: Secondary | ICD-10-CM | POA: Diagnosis not present

## 2022-11-02 DIAGNOSIS — J9691 Respiratory failure, unspecified with hypoxia: Secondary | ICD-10-CM | POA: Diagnosis not present

## 2022-11-02 DIAGNOSIS — E785 Hyperlipidemia, unspecified: Secondary | ICD-10-CM | POA: Diagnosis not present

## 2022-11-02 DIAGNOSIS — I059 Rheumatic mitral valve disease, unspecified: Secondary | ICD-10-CM | POA: Diagnosis not present

## 2022-11-02 DIAGNOSIS — I69318 Other symptoms and signs involving cognitive functions following cerebral infarction: Secondary | ICD-10-CM | POA: Diagnosis not present

## 2022-11-02 DIAGNOSIS — I2510 Atherosclerotic heart disease of native coronary artery without angina pectoris: Secondary | ICD-10-CM | POA: Diagnosis not present

## 2022-11-03 DIAGNOSIS — I059 Rheumatic mitral valve disease, unspecified: Secondary | ICD-10-CM | POA: Diagnosis not present

## 2022-11-03 DIAGNOSIS — E785 Hyperlipidemia, unspecified: Secondary | ICD-10-CM | POA: Diagnosis not present

## 2022-11-03 DIAGNOSIS — I69354 Hemiplegia and hemiparesis following cerebral infarction affecting left non-dominant side: Secondary | ICD-10-CM | POA: Diagnosis not present

## 2022-11-03 DIAGNOSIS — I69318 Other symptoms and signs involving cognitive functions following cerebral infarction: Secondary | ICD-10-CM | POA: Diagnosis not present

## 2022-11-03 DIAGNOSIS — I2510 Atherosclerotic heart disease of native coronary artery without angina pectoris: Secondary | ICD-10-CM | POA: Diagnosis not present

## 2022-11-03 DIAGNOSIS — J9691 Respiratory failure, unspecified with hypoxia: Secondary | ICD-10-CM | POA: Diagnosis not present

## 2022-11-05 DIAGNOSIS — I059 Rheumatic mitral valve disease, unspecified: Secondary | ICD-10-CM | POA: Diagnosis not present

## 2022-11-05 DIAGNOSIS — J9691 Respiratory failure, unspecified with hypoxia: Secondary | ICD-10-CM | POA: Diagnosis not present

## 2022-11-05 DIAGNOSIS — I2510 Atherosclerotic heart disease of native coronary artery without angina pectoris: Secondary | ICD-10-CM | POA: Diagnosis not present

## 2022-11-05 DIAGNOSIS — E785 Hyperlipidemia, unspecified: Secondary | ICD-10-CM | POA: Diagnosis not present

## 2022-11-05 DIAGNOSIS — I69318 Other symptoms and signs involving cognitive functions following cerebral infarction: Secondary | ICD-10-CM | POA: Diagnosis not present

## 2022-11-05 DIAGNOSIS — I69354 Hemiplegia and hemiparesis following cerebral infarction affecting left non-dominant side: Secondary | ICD-10-CM | POA: Diagnosis not present

## 2022-11-08 DIAGNOSIS — I69354 Hemiplegia and hemiparesis following cerebral infarction affecting left non-dominant side: Secondary | ICD-10-CM | POA: Diagnosis not present

## 2022-11-08 DIAGNOSIS — J9691 Respiratory failure, unspecified with hypoxia: Secondary | ICD-10-CM | POA: Diagnosis not present

## 2022-11-08 DIAGNOSIS — I2510 Atherosclerotic heart disease of native coronary artery without angina pectoris: Secondary | ICD-10-CM | POA: Diagnosis not present

## 2022-11-08 DIAGNOSIS — E785 Hyperlipidemia, unspecified: Secondary | ICD-10-CM | POA: Diagnosis not present

## 2022-11-08 DIAGNOSIS — I059 Rheumatic mitral valve disease, unspecified: Secondary | ICD-10-CM | POA: Diagnosis not present

## 2022-11-08 DIAGNOSIS — I69318 Other symptoms and signs involving cognitive functions following cerebral infarction: Secondary | ICD-10-CM | POA: Diagnosis not present

## 2022-11-10 DIAGNOSIS — I2510 Atherosclerotic heart disease of native coronary artery without angina pectoris: Secondary | ICD-10-CM | POA: Diagnosis not present

## 2022-11-10 DIAGNOSIS — E785 Hyperlipidemia, unspecified: Secondary | ICD-10-CM | POA: Diagnosis not present

## 2022-11-10 DIAGNOSIS — J9691 Respiratory failure, unspecified with hypoxia: Secondary | ICD-10-CM | POA: Diagnosis not present

## 2022-11-10 DIAGNOSIS — I059 Rheumatic mitral valve disease, unspecified: Secondary | ICD-10-CM | POA: Diagnosis not present

## 2022-11-10 DIAGNOSIS — I69354 Hemiplegia and hemiparesis following cerebral infarction affecting left non-dominant side: Secondary | ICD-10-CM | POA: Diagnosis not present

## 2022-11-10 DIAGNOSIS — I69318 Other symptoms and signs involving cognitive functions following cerebral infarction: Secondary | ICD-10-CM | POA: Diagnosis not present

## 2022-11-12 DIAGNOSIS — I2510 Atherosclerotic heart disease of native coronary artery without angina pectoris: Secondary | ICD-10-CM | POA: Diagnosis not present

## 2022-11-12 DIAGNOSIS — J9691 Respiratory failure, unspecified with hypoxia: Secondary | ICD-10-CM | POA: Diagnosis not present

## 2022-11-12 DIAGNOSIS — E785 Hyperlipidemia, unspecified: Secondary | ICD-10-CM | POA: Diagnosis not present

## 2022-11-12 DIAGNOSIS — I69318 Other symptoms and signs involving cognitive functions following cerebral infarction: Secondary | ICD-10-CM | POA: Diagnosis not present

## 2022-11-12 DIAGNOSIS — I69354 Hemiplegia and hemiparesis following cerebral infarction affecting left non-dominant side: Secondary | ICD-10-CM | POA: Diagnosis not present

## 2022-11-12 DIAGNOSIS — I059 Rheumatic mitral valve disease, unspecified: Secondary | ICD-10-CM | POA: Diagnosis not present

## 2022-11-13 DIAGNOSIS — I059 Rheumatic mitral valve disease, unspecified: Secondary | ICD-10-CM | POA: Diagnosis not present

## 2022-11-13 DIAGNOSIS — E785 Hyperlipidemia, unspecified: Secondary | ICD-10-CM | POA: Diagnosis not present

## 2022-11-13 DIAGNOSIS — J9691 Respiratory failure, unspecified with hypoxia: Secondary | ICD-10-CM | POA: Diagnosis not present

## 2022-11-13 DIAGNOSIS — I2510 Atherosclerotic heart disease of native coronary artery without angina pectoris: Secondary | ICD-10-CM | POA: Diagnosis not present

## 2022-11-13 DIAGNOSIS — I69318 Other symptoms and signs involving cognitive functions following cerebral infarction: Secondary | ICD-10-CM | POA: Diagnosis not present

## 2022-11-13 DIAGNOSIS — K219 Gastro-esophageal reflux disease without esophagitis: Secondary | ICD-10-CM | POA: Diagnosis not present

## 2022-11-13 DIAGNOSIS — I69354 Hemiplegia and hemiparesis following cerebral infarction affecting left non-dominant side: Secondary | ICD-10-CM | POA: Diagnosis not present

## 2022-11-13 DIAGNOSIS — I4891 Unspecified atrial fibrillation: Secondary | ICD-10-CM | POA: Diagnosis not present

## 2022-11-13 DIAGNOSIS — N183 Chronic kidney disease, stage 3 unspecified: Secondary | ICD-10-CM | POA: Diagnosis not present

## 2022-11-15 DIAGNOSIS — I2510 Atherosclerotic heart disease of native coronary artery without angina pectoris: Secondary | ICD-10-CM | POA: Diagnosis not present

## 2022-11-15 DIAGNOSIS — I69354 Hemiplegia and hemiparesis following cerebral infarction affecting left non-dominant side: Secondary | ICD-10-CM | POA: Diagnosis not present

## 2022-11-15 DIAGNOSIS — J9691 Respiratory failure, unspecified with hypoxia: Secondary | ICD-10-CM | POA: Diagnosis not present

## 2022-11-15 DIAGNOSIS — E785 Hyperlipidemia, unspecified: Secondary | ICD-10-CM | POA: Diagnosis not present

## 2022-11-15 DIAGNOSIS — I69318 Other symptoms and signs involving cognitive functions following cerebral infarction: Secondary | ICD-10-CM | POA: Diagnosis not present

## 2022-11-15 DIAGNOSIS — I059 Rheumatic mitral valve disease, unspecified: Secondary | ICD-10-CM | POA: Diagnosis not present

## 2022-11-17 DIAGNOSIS — J9691 Respiratory failure, unspecified with hypoxia: Secondary | ICD-10-CM | POA: Diagnosis not present

## 2022-11-17 DIAGNOSIS — I2510 Atherosclerotic heart disease of native coronary artery without angina pectoris: Secondary | ICD-10-CM | POA: Diagnosis not present

## 2022-11-17 DIAGNOSIS — I059 Rheumatic mitral valve disease, unspecified: Secondary | ICD-10-CM | POA: Diagnosis not present

## 2022-11-17 DIAGNOSIS — E785 Hyperlipidemia, unspecified: Secondary | ICD-10-CM | POA: Diagnosis not present

## 2022-11-17 DIAGNOSIS — I69354 Hemiplegia and hemiparesis following cerebral infarction affecting left non-dominant side: Secondary | ICD-10-CM | POA: Diagnosis not present

## 2022-11-17 DIAGNOSIS — I69318 Other symptoms and signs involving cognitive functions following cerebral infarction: Secondary | ICD-10-CM | POA: Diagnosis not present

## 2022-11-19 DIAGNOSIS — E785 Hyperlipidemia, unspecified: Secondary | ICD-10-CM | POA: Diagnosis not present

## 2022-11-19 DIAGNOSIS — J9691 Respiratory failure, unspecified with hypoxia: Secondary | ICD-10-CM | POA: Diagnosis not present

## 2022-11-19 DIAGNOSIS — I2510 Atherosclerotic heart disease of native coronary artery without angina pectoris: Secondary | ICD-10-CM | POA: Diagnosis not present

## 2022-11-19 DIAGNOSIS — I69354 Hemiplegia and hemiparesis following cerebral infarction affecting left non-dominant side: Secondary | ICD-10-CM | POA: Diagnosis not present

## 2022-11-19 DIAGNOSIS — I059 Rheumatic mitral valve disease, unspecified: Secondary | ICD-10-CM | POA: Diagnosis not present

## 2022-11-19 DIAGNOSIS — I69318 Other symptoms and signs involving cognitive functions following cerebral infarction: Secondary | ICD-10-CM | POA: Diagnosis not present

## 2022-11-22 DIAGNOSIS — I69318 Other symptoms and signs involving cognitive functions following cerebral infarction: Secondary | ICD-10-CM | POA: Diagnosis not present

## 2022-11-22 DIAGNOSIS — E785 Hyperlipidemia, unspecified: Secondary | ICD-10-CM | POA: Diagnosis not present

## 2022-11-22 DIAGNOSIS — I059 Rheumatic mitral valve disease, unspecified: Secondary | ICD-10-CM | POA: Diagnosis not present

## 2022-11-22 DIAGNOSIS — I69354 Hemiplegia and hemiparesis following cerebral infarction affecting left non-dominant side: Secondary | ICD-10-CM | POA: Diagnosis not present

## 2022-11-22 DIAGNOSIS — J9691 Respiratory failure, unspecified with hypoxia: Secondary | ICD-10-CM | POA: Diagnosis not present

## 2022-11-22 DIAGNOSIS — I2510 Atherosclerotic heart disease of native coronary artery without angina pectoris: Secondary | ICD-10-CM | POA: Diagnosis not present

## 2022-11-24 DIAGNOSIS — I2510 Atherosclerotic heart disease of native coronary artery without angina pectoris: Secondary | ICD-10-CM | POA: Diagnosis not present

## 2022-11-24 DIAGNOSIS — I059 Rheumatic mitral valve disease, unspecified: Secondary | ICD-10-CM | POA: Diagnosis not present

## 2022-11-24 DIAGNOSIS — I69318 Other symptoms and signs involving cognitive functions following cerebral infarction: Secondary | ICD-10-CM | POA: Diagnosis not present

## 2022-11-24 DIAGNOSIS — J9691 Respiratory failure, unspecified with hypoxia: Secondary | ICD-10-CM | POA: Diagnosis not present

## 2022-11-24 DIAGNOSIS — I69354 Hemiplegia and hemiparesis following cerebral infarction affecting left non-dominant side: Secondary | ICD-10-CM | POA: Diagnosis not present

## 2022-11-24 DIAGNOSIS — E785 Hyperlipidemia, unspecified: Secondary | ICD-10-CM | POA: Diagnosis not present

## 2022-11-26 DIAGNOSIS — I2510 Atherosclerotic heart disease of native coronary artery without angina pectoris: Secondary | ICD-10-CM | POA: Diagnosis not present

## 2022-11-26 DIAGNOSIS — I69318 Other symptoms and signs involving cognitive functions following cerebral infarction: Secondary | ICD-10-CM | POA: Diagnosis not present

## 2022-11-26 DIAGNOSIS — J9691 Respiratory failure, unspecified with hypoxia: Secondary | ICD-10-CM | POA: Diagnosis not present

## 2022-11-26 DIAGNOSIS — E785 Hyperlipidemia, unspecified: Secondary | ICD-10-CM | POA: Diagnosis not present

## 2022-11-26 DIAGNOSIS — I69354 Hemiplegia and hemiparesis following cerebral infarction affecting left non-dominant side: Secondary | ICD-10-CM | POA: Diagnosis not present

## 2022-11-26 DIAGNOSIS — I059 Rheumatic mitral valve disease, unspecified: Secondary | ICD-10-CM | POA: Diagnosis not present

## 2022-11-29 DIAGNOSIS — I2510 Atherosclerotic heart disease of native coronary artery without angina pectoris: Secondary | ICD-10-CM | POA: Diagnosis not present

## 2022-11-29 DIAGNOSIS — E785 Hyperlipidemia, unspecified: Secondary | ICD-10-CM | POA: Diagnosis not present

## 2022-11-29 DIAGNOSIS — I69354 Hemiplegia and hemiparesis following cerebral infarction affecting left non-dominant side: Secondary | ICD-10-CM | POA: Diagnosis not present

## 2022-11-29 DIAGNOSIS — I69318 Other symptoms and signs involving cognitive functions following cerebral infarction: Secondary | ICD-10-CM | POA: Diagnosis not present

## 2022-11-29 DIAGNOSIS — J9691 Respiratory failure, unspecified with hypoxia: Secondary | ICD-10-CM | POA: Diagnosis not present

## 2022-11-29 DIAGNOSIS — I059 Rheumatic mitral valve disease, unspecified: Secondary | ICD-10-CM | POA: Diagnosis not present

## 2022-12-01 DIAGNOSIS — I2510 Atherosclerotic heart disease of native coronary artery without angina pectoris: Secondary | ICD-10-CM | POA: Diagnosis not present

## 2022-12-01 DIAGNOSIS — J9691 Respiratory failure, unspecified with hypoxia: Secondary | ICD-10-CM | POA: Diagnosis not present

## 2022-12-01 DIAGNOSIS — I059 Rheumatic mitral valve disease, unspecified: Secondary | ICD-10-CM | POA: Diagnosis not present

## 2022-12-01 DIAGNOSIS — E785 Hyperlipidemia, unspecified: Secondary | ICD-10-CM | POA: Diagnosis not present

## 2022-12-01 DIAGNOSIS — I69354 Hemiplegia and hemiparesis following cerebral infarction affecting left non-dominant side: Secondary | ICD-10-CM | POA: Diagnosis not present

## 2022-12-01 DIAGNOSIS — I69318 Other symptoms and signs involving cognitive functions following cerebral infarction: Secondary | ICD-10-CM | POA: Diagnosis not present

## 2022-12-03 DIAGNOSIS — J9691 Respiratory failure, unspecified with hypoxia: Secondary | ICD-10-CM | POA: Diagnosis not present

## 2022-12-03 DIAGNOSIS — I69318 Other symptoms and signs involving cognitive functions following cerebral infarction: Secondary | ICD-10-CM | POA: Diagnosis not present

## 2022-12-03 DIAGNOSIS — I2510 Atherosclerotic heart disease of native coronary artery without angina pectoris: Secondary | ICD-10-CM | POA: Diagnosis not present

## 2022-12-03 DIAGNOSIS — E785 Hyperlipidemia, unspecified: Secondary | ICD-10-CM | POA: Diagnosis not present

## 2022-12-03 DIAGNOSIS — I69354 Hemiplegia and hemiparesis following cerebral infarction affecting left non-dominant side: Secondary | ICD-10-CM | POA: Diagnosis not present

## 2022-12-03 DIAGNOSIS — I059 Rheumatic mitral valve disease, unspecified: Secondary | ICD-10-CM | POA: Diagnosis not present

## 2022-12-06 DIAGNOSIS — I2510 Atherosclerotic heart disease of native coronary artery without angina pectoris: Secondary | ICD-10-CM | POA: Diagnosis not present

## 2022-12-06 DIAGNOSIS — J9691 Respiratory failure, unspecified with hypoxia: Secondary | ICD-10-CM | POA: Diagnosis not present

## 2022-12-06 DIAGNOSIS — I69318 Other symptoms and signs involving cognitive functions following cerebral infarction: Secondary | ICD-10-CM | POA: Diagnosis not present

## 2022-12-06 DIAGNOSIS — I69354 Hemiplegia and hemiparesis following cerebral infarction affecting left non-dominant side: Secondary | ICD-10-CM | POA: Diagnosis not present

## 2022-12-06 DIAGNOSIS — E785 Hyperlipidemia, unspecified: Secondary | ICD-10-CM | POA: Diagnosis not present

## 2022-12-06 DIAGNOSIS — I059 Rheumatic mitral valve disease, unspecified: Secondary | ICD-10-CM | POA: Diagnosis not present

## 2022-12-08 DIAGNOSIS — I059 Rheumatic mitral valve disease, unspecified: Secondary | ICD-10-CM | POA: Diagnosis not present

## 2022-12-08 DIAGNOSIS — I2510 Atherosclerotic heart disease of native coronary artery without angina pectoris: Secondary | ICD-10-CM | POA: Diagnosis not present

## 2022-12-08 DIAGNOSIS — E785 Hyperlipidemia, unspecified: Secondary | ICD-10-CM | POA: Diagnosis not present

## 2022-12-08 DIAGNOSIS — I69354 Hemiplegia and hemiparesis following cerebral infarction affecting left non-dominant side: Secondary | ICD-10-CM | POA: Diagnosis not present

## 2022-12-08 DIAGNOSIS — I69318 Other symptoms and signs involving cognitive functions following cerebral infarction: Secondary | ICD-10-CM | POA: Diagnosis not present

## 2022-12-08 DIAGNOSIS — J9691 Respiratory failure, unspecified with hypoxia: Secondary | ICD-10-CM | POA: Diagnosis not present

## 2022-12-10 DIAGNOSIS — J9691 Respiratory failure, unspecified with hypoxia: Secondary | ICD-10-CM | POA: Diagnosis not present

## 2022-12-10 DIAGNOSIS — E785 Hyperlipidemia, unspecified: Secondary | ICD-10-CM | POA: Diagnosis not present

## 2022-12-10 DIAGNOSIS — I2510 Atherosclerotic heart disease of native coronary artery without angina pectoris: Secondary | ICD-10-CM | POA: Diagnosis not present

## 2022-12-10 DIAGNOSIS — I69318 Other symptoms and signs involving cognitive functions following cerebral infarction: Secondary | ICD-10-CM | POA: Diagnosis not present

## 2022-12-10 DIAGNOSIS — I69354 Hemiplegia and hemiparesis following cerebral infarction affecting left non-dominant side: Secondary | ICD-10-CM | POA: Diagnosis not present

## 2022-12-10 DIAGNOSIS — I059 Rheumatic mitral valve disease, unspecified: Secondary | ICD-10-CM | POA: Diagnosis not present

## 2022-12-13 DIAGNOSIS — I69354 Hemiplegia and hemiparesis following cerebral infarction affecting left non-dominant side: Secondary | ICD-10-CM | POA: Diagnosis not present

## 2022-12-13 DIAGNOSIS — N183 Chronic kidney disease, stage 3 unspecified: Secondary | ICD-10-CM | POA: Diagnosis not present

## 2022-12-13 DIAGNOSIS — I4891 Unspecified atrial fibrillation: Secondary | ICD-10-CM | POA: Diagnosis not present

## 2022-12-13 DIAGNOSIS — J9691 Respiratory failure, unspecified with hypoxia: Secondary | ICD-10-CM | POA: Diagnosis not present

## 2022-12-13 DIAGNOSIS — I69318 Other symptoms and signs involving cognitive functions following cerebral infarction: Secondary | ICD-10-CM | POA: Diagnosis not present

## 2022-12-13 DIAGNOSIS — E785 Hyperlipidemia, unspecified: Secondary | ICD-10-CM | POA: Diagnosis not present

## 2022-12-13 DIAGNOSIS — I2510 Atherosclerotic heart disease of native coronary artery without angina pectoris: Secondary | ICD-10-CM | POA: Diagnosis not present

## 2022-12-13 DIAGNOSIS — K219 Gastro-esophageal reflux disease without esophagitis: Secondary | ICD-10-CM | POA: Diagnosis not present

## 2022-12-13 DIAGNOSIS — I059 Rheumatic mitral valve disease, unspecified: Secondary | ICD-10-CM | POA: Diagnosis not present

## 2022-12-15 DIAGNOSIS — I059 Rheumatic mitral valve disease, unspecified: Secondary | ICD-10-CM | POA: Diagnosis not present

## 2022-12-15 DIAGNOSIS — I69354 Hemiplegia and hemiparesis following cerebral infarction affecting left non-dominant side: Secondary | ICD-10-CM | POA: Diagnosis not present

## 2022-12-15 DIAGNOSIS — J9691 Respiratory failure, unspecified with hypoxia: Secondary | ICD-10-CM | POA: Diagnosis not present

## 2022-12-15 DIAGNOSIS — E785 Hyperlipidemia, unspecified: Secondary | ICD-10-CM | POA: Diagnosis not present

## 2022-12-15 DIAGNOSIS — I69318 Other symptoms and signs involving cognitive functions following cerebral infarction: Secondary | ICD-10-CM | POA: Diagnosis not present

## 2022-12-15 DIAGNOSIS — I2510 Atherosclerotic heart disease of native coronary artery without angina pectoris: Secondary | ICD-10-CM | POA: Diagnosis not present

## 2022-12-16 DIAGNOSIS — I059 Rheumatic mitral valve disease, unspecified: Secondary | ICD-10-CM | POA: Diagnosis not present

## 2022-12-16 DIAGNOSIS — E785 Hyperlipidemia, unspecified: Secondary | ICD-10-CM | POA: Diagnosis not present

## 2022-12-16 DIAGNOSIS — I2510 Atherosclerotic heart disease of native coronary artery without angina pectoris: Secondary | ICD-10-CM | POA: Diagnosis not present

## 2022-12-16 DIAGNOSIS — J9691 Respiratory failure, unspecified with hypoxia: Secondary | ICD-10-CM | POA: Diagnosis not present

## 2022-12-16 DIAGNOSIS — I69318 Other symptoms and signs involving cognitive functions following cerebral infarction: Secondary | ICD-10-CM | POA: Diagnosis not present

## 2022-12-16 DIAGNOSIS — I69354 Hemiplegia and hemiparesis following cerebral infarction affecting left non-dominant side: Secondary | ICD-10-CM | POA: Diagnosis not present

## 2022-12-17 DIAGNOSIS — I059 Rheumatic mitral valve disease, unspecified: Secondary | ICD-10-CM | POA: Diagnosis not present

## 2022-12-17 DIAGNOSIS — J9691 Respiratory failure, unspecified with hypoxia: Secondary | ICD-10-CM | POA: Diagnosis not present

## 2022-12-17 DIAGNOSIS — I69354 Hemiplegia and hemiparesis following cerebral infarction affecting left non-dominant side: Secondary | ICD-10-CM | POA: Diagnosis not present

## 2022-12-17 DIAGNOSIS — I2510 Atherosclerotic heart disease of native coronary artery without angina pectoris: Secondary | ICD-10-CM | POA: Diagnosis not present

## 2022-12-17 DIAGNOSIS — I69318 Other symptoms and signs involving cognitive functions following cerebral infarction: Secondary | ICD-10-CM | POA: Diagnosis not present

## 2022-12-17 DIAGNOSIS — E785 Hyperlipidemia, unspecified: Secondary | ICD-10-CM | POA: Diagnosis not present

## 2022-12-18 DIAGNOSIS — I69318 Other symptoms and signs involving cognitive functions following cerebral infarction: Secondary | ICD-10-CM | POA: Diagnosis not present

## 2022-12-18 DIAGNOSIS — I059 Rheumatic mitral valve disease, unspecified: Secondary | ICD-10-CM | POA: Diagnosis not present

## 2022-12-18 DIAGNOSIS — E785 Hyperlipidemia, unspecified: Secondary | ICD-10-CM | POA: Diagnosis not present

## 2022-12-18 DIAGNOSIS — I2510 Atherosclerotic heart disease of native coronary artery without angina pectoris: Secondary | ICD-10-CM | POA: Diagnosis not present

## 2022-12-18 DIAGNOSIS — I69354 Hemiplegia and hemiparesis following cerebral infarction affecting left non-dominant side: Secondary | ICD-10-CM | POA: Diagnosis not present

## 2022-12-18 DIAGNOSIS — J9691 Respiratory failure, unspecified with hypoxia: Secondary | ICD-10-CM | POA: Diagnosis not present

## 2022-12-19 DIAGNOSIS — I059 Rheumatic mitral valve disease, unspecified: Secondary | ICD-10-CM | POA: Diagnosis not present

## 2022-12-19 DIAGNOSIS — E785 Hyperlipidemia, unspecified: Secondary | ICD-10-CM | POA: Diagnosis not present

## 2022-12-19 DIAGNOSIS — I69354 Hemiplegia and hemiparesis following cerebral infarction affecting left non-dominant side: Secondary | ICD-10-CM | POA: Diagnosis not present

## 2022-12-19 DIAGNOSIS — I69318 Other symptoms and signs involving cognitive functions following cerebral infarction: Secondary | ICD-10-CM | POA: Diagnosis not present

## 2022-12-19 DIAGNOSIS — I2510 Atherosclerotic heart disease of native coronary artery without angina pectoris: Secondary | ICD-10-CM | POA: Diagnosis not present

## 2022-12-19 DIAGNOSIS — J9691 Respiratory failure, unspecified with hypoxia: Secondary | ICD-10-CM | POA: Diagnosis not present

## 2022-12-20 DIAGNOSIS — I69354 Hemiplegia and hemiparesis following cerebral infarction affecting left non-dominant side: Secondary | ICD-10-CM | POA: Diagnosis not present

## 2022-12-20 DIAGNOSIS — I69318 Other symptoms and signs involving cognitive functions following cerebral infarction: Secondary | ICD-10-CM | POA: Diagnosis not present

## 2022-12-20 DIAGNOSIS — E785 Hyperlipidemia, unspecified: Secondary | ICD-10-CM | POA: Diagnosis not present

## 2022-12-20 DIAGNOSIS — I2510 Atherosclerotic heart disease of native coronary artery without angina pectoris: Secondary | ICD-10-CM | POA: Diagnosis not present

## 2022-12-20 DIAGNOSIS — I059 Rheumatic mitral valve disease, unspecified: Secondary | ICD-10-CM | POA: Diagnosis not present

## 2022-12-20 DIAGNOSIS — J9691 Respiratory failure, unspecified with hypoxia: Secondary | ICD-10-CM | POA: Diagnosis not present

## 2022-12-21 DIAGNOSIS — I69354 Hemiplegia and hemiparesis following cerebral infarction affecting left non-dominant side: Secondary | ICD-10-CM | POA: Diagnosis not present

## 2022-12-21 DIAGNOSIS — I2510 Atherosclerotic heart disease of native coronary artery without angina pectoris: Secondary | ICD-10-CM | POA: Diagnosis not present

## 2022-12-21 DIAGNOSIS — J9691 Respiratory failure, unspecified with hypoxia: Secondary | ICD-10-CM | POA: Diagnosis not present

## 2022-12-21 DIAGNOSIS — I059 Rheumatic mitral valve disease, unspecified: Secondary | ICD-10-CM | POA: Diagnosis not present

## 2022-12-21 DIAGNOSIS — E785 Hyperlipidemia, unspecified: Secondary | ICD-10-CM | POA: Diagnosis not present

## 2022-12-21 DIAGNOSIS — I69318 Other symptoms and signs involving cognitive functions following cerebral infarction: Secondary | ICD-10-CM | POA: Diagnosis not present

## 2022-12-22 DIAGNOSIS — I69354 Hemiplegia and hemiparesis following cerebral infarction affecting left non-dominant side: Secondary | ICD-10-CM | POA: Diagnosis not present

## 2022-12-22 DIAGNOSIS — I2510 Atherosclerotic heart disease of native coronary artery without angina pectoris: Secondary | ICD-10-CM | POA: Diagnosis not present

## 2022-12-22 DIAGNOSIS — J9691 Respiratory failure, unspecified with hypoxia: Secondary | ICD-10-CM | POA: Diagnosis not present

## 2022-12-22 DIAGNOSIS — I059 Rheumatic mitral valve disease, unspecified: Secondary | ICD-10-CM | POA: Diagnosis not present

## 2022-12-22 DIAGNOSIS — I69318 Other symptoms and signs involving cognitive functions following cerebral infarction: Secondary | ICD-10-CM | POA: Diagnosis not present

## 2022-12-22 DIAGNOSIS — E785 Hyperlipidemia, unspecified: Secondary | ICD-10-CM | POA: Diagnosis not present

## 2022-12-23 DIAGNOSIS — I059 Rheumatic mitral valve disease, unspecified: Secondary | ICD-10-CM | POA: Diagnosis not present

## 2022-12-23 DIAGNOSIS — I2510 Atherosclerotic heart disease of native coronary artery without angina pectoris: Secondary | ICD-10-CM | POA: Diagnosis not present

## 2022-12-23 DIAGNOSIS — I69318 Other symptoms and signs involving cognitive functions following cerebral infarction: Secondary | ICD-10-CM | POA: Diagnosis not present

## 2022-12-23 DIAGNOSIS — E785 Hyperlipidemia, unspecified: Secondary | ICD-10-CM | POA: Diagnosis not present

## 2022-12-23 DIAGNOSIS — J9691 Respiratory failure, unspecified with hypoxia: Secondary | ICD-10-CM | POA: Diagnosis not present

## 2022-12-23 DIAGNOSIS — I69354 Hemiplegia and hemiparesis following cerebral infarction affecting left non-dominant side: Secondary | ICD-10-CM | POA: Diagnosis not present

## 2022-12-24 DIAGNOSIS — I69318 Other symptoms and signs involving cognitive functions following cerebral infarction: Secondary | ICD-10-CM | POA: Diagnosis not present

## 2022-12-24 DIAGNOSIS — I69354 Hemiplegia and hemiparesis following cerebral infarction affecting left non-dominant side: Secondary | ICD-10-CM | POA: Diagnosis not present

## 2022-12-24 DIAGNOSIS — I059 Rheumatic mitral valve disease, unspecified: Secondary | ICD-10-CM | POA: Diagnosis not present

## 2022-12-24 DIAGNOSIS — E785 Hyperlipidemia, unspecified: Secondary | ICD-10-CM | POA: Diagnosis not present

## 2022-12-24 DIAGNOSIS — J9691 Respiratory failure, unspecified with hypoxia: Secondary | ICD-10-CM | POA: Diagnosis not present

## 2022-12-24 DIAGNOSIS — I2510 Atherosclerotic heart disease of native coronary artery without angina pectoris: Secondary | ICD-10-CM | POA: Diagnosis not present

## 2022-12-25 DIAGNOSIS — I69318 Other symptoms and signs involving cognitive functions following cerebral infarction: Secondary | ICD-10-CM | POA: Diagnosis not present

## 2022-12-25 DIAGNOSIS — I059 Rheumatic mitral valve disease, unspecified: Secondary | ICD-10-CM | POA: Diagnosis not present

## 2022-12-25 DIAGNOSIS — J9691 Respiratory failure, unspecified with hypoxia: Secondary | ICD-10-CM | POA: Diagnosis not present

## 2022-12-25 DIAGNOSIS — I2510 Atherosclerotic heart disease of native coronary artery without angina pectoris: Secondary | ICD-10-CM | POA: Diagnosis not present

## 2022-12-25 DIAGNOSIS — I69354 Hemiplegia and hemiparesis following cerebral infarction affecting left non-dominant side: Secondary | ICD-10-CM | POA: Diagnosis not present

## 2022-12-25 DIAGNOSIS — E785 Hyperlipidemia, unspecified: Secondary | ICD-10-CM | POA: Diagnosis not present

## 2023-01-13 DEATH — deceased
